# Patient Record
Sex: Female | Born: 1955 | Race: Black or African American | Hispanic: No | Marital: Single
Health system: Southern US, Academic
[De-identification: ages and names within clinical notes are randomized; demographics above are authoritative.]

## PROBLEM LIST (undated history)

## (undated) ENCOUNTER — Ambulatory Visit: Payer: MEDICARE

## (undated) ENCOUNTER — Telehealth

## (undated) ENCOUNTER — Encounter

## (undated) ENCOUNTER — Ambulatory Visit

## (undated) ENCOUNTER — Encounter: Payer: MEDICARE | Attending: Internal Medicine | Primary: Internal Medicine

## (undated) ENCOUNTER — Encounter: Payer: MEDICARE | Attending: Nephrology | Primary: Nephrology

## (undated) ENCOUNTER — Ambulatory Visit: Payer: Medicare (Managed Care)

## (undated) ENCOUNTER — Encounter: Attending: Physician Assistant | Primary: Physician Assistant

## (undated) ENCOUNTER — Ambulatory Visit: Payer: MEDICARE | Attending: Surgery | Primary: Surgery

## (undated) ENCOUNTER — Telehealth: Attending: Hospitalist | Primary: Hospitalist

## (undated) ENCOUNTER — Ambulatory Visit: Payer: MEDICARE | Attending: Psychologist | Primary: Psychologist

## (undated) ENCOUNTER — Inpatient Hospital Stay

## (undated) ENCOUNTER — Non-Acute Institutional Stay: Payer: MEDICARE | Attending: Nephrology | Primary: Nephrology

## (undated) ENCOUNTER — Ambulatory Visit
Payer: MEDICARE | Attending: Student in an Organized Health Care Education/Training Program | Primary: Student in an Organized Health Care Education/Training Program

## (undated) ENCOUNTER — Encounter: Attending: Nephrology | Primary: Nephrology

## (undated) DIAGNOSIS — D649 Anemia, unspecified: Secondary | ICD-10-CM

## (undated) DIAGNOSIS — K754 Autoimmune hepatitis: Secondary | ICD-10-CM

## (undated) DIAGNOSIS — R569 Unspecified convulsions: Principal | ICD-10-CM

## (undated) DIAGNOSIS — N186 End stage renal disease: Secondary | ICD-10-CM

## (undated) DIAGNOSIS — M1990 Unspecified osteoarthritis, unspecified site: Secondary | ICD-10-CM

## (undated) DIAGNOSIS — Z992 Dependence on renal dialysis: Secondary | ICD-10-CM

## (undated) DIAGNOSIS — I10 Essential (primary) hypertension: Secondary | ICD-10-CM

## (undated) DIAGNOSIS — M109 Gout, unspecified: Secondary | ICD-10-CM

## (undated) HISTORY — PX: ABDOMINAL HYSTERECTOMY: SHX81

## (undated) HISTORY — DX: Unspecified convulsions: R56.9

## (undated) HISTORY — PX: TONSILLECTOMY: SUR1361

## (undated) HISTORY — DX: Autoimmune hepatitis: K75.4

## (undated) HISTORY — PX: LIVER TRANSPLANT: SHX410

## (undated) MED ORDER — VITAMIN E (DL, ACETATE) 22.5 MG (50 UNIT)/ML ORAL DROPS: ORAL | 0 days

---
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
## 2008-11-13 ENCOUNTER — Ambulatory Visit: Payer: Self-pay | Admitting: Hematology & Oncology

## 2008-11-19 LAB — CBC WITH DIFFERENTIAL (CANCER CENTER ONLY)
BASO#: 0 10*3/uL (ref 0.0–0.2)
EOS%: 2.7 % (ref 0.0–7.0)
HCT: 28.2 % — ABNORMAL LOW (ref 34.8–46.6)
HGB: 9.6 g/dL — ABNORMAL LOW (ref 11.6–15.9)
MCH: 30.7 pg (ref 26.0–34.0)
MCHC: 33.9 g/dL (ref 32.0–36.0)
MONO%: 7 % (ref 0.0–13.0)
NEUT%: 59.8 % (ref 39.6–80.0)

## 2008-11-21 LAB — RETICULOCYTES (CHCC)
ABS Retic: 30.4 10*3/uL (ref 19.0–186.0)
RBC.: 3.38 MIL/uL — ABNORMAL LOW (ref 3.87–5.11)

## 2008-11-21 LAB — PROTEIN ELECTROPHORESIS, SERUM
Albumin ELP: 41.1 % — ABNORMAL LOW (ref 55.8–66.1)
Beta Globulin: 4 % — ABNORMAL LOW (ref 4.7–7.2)
Total Protein, Serum Electrophoresis: 9.4 g/dL — ABNORMAL HIGH (ref 6.0–8.3)

## 2008-11-21 LAB — COMPREHENSIVE METABOLIC PANEL
Alkaline Phosphatase: 111 U/L (ref 39–117)
Glucose, Bld: 143 mg/dL — ABNORMAL HIGH (ref 70–99)
Sodium: 135 mEq/L (ref 135–145)
Total Bilirubin: 0.6 mg/dL (ref 0.3–1.2)
Total Protein: 9.4 g/dL — ABNORMAL HIGH (ref 6.0–8.3)

## 2008-11-21 LAB — FERRITIN: Ferritin: 920 ng/mL — ABNORMAL HIGH (ref 10–291)

## 2008-12-10 LAB — CBC WITH DIFFERENTIAL (CANCER CENTER ONLY)
BASO#: 0 10*3/uL (ref 0.0–0.2)
EOS%: 5.2 % (ref 0.0–7.0)
HCT: 34.5 % — ABNORMAL LOW (ref 34.8–46.6)
HGB: 11.3 g/dL — ABNORMAL LOW (ref 11.6–15.9)
LYMPH#: 0.6 10*3/uL — ABNORMAL LOW (ref 0.9–3.3)
MCHC: 32.9 g/dL (ref 32.0–36.0)
MONO#: 0.2 10*3/uL (ref 0.1–0.9)
NEUT%: 55.7 % (ref 39.6–80.0)

## 2008-12-30 ENCOUNTER — Ambulatory Visit: Payer: Self-pay | Admitting: Hematology & Oncology

## 2008-12-31 LAB — CBC WITH DIFFERENTIAL (CANCER CENTER ONLY)
BASO#: 0 10*3/uL (ref 0.0–0.2)
BASO%: 0.5 % (ref 0.0–2.0)
Eosinophils Absolute: 0.1 10*3/uL (ref 0.0–0.5)
HCT: 30.5 % — ABNORMAL LOW (ref 34.8–46.6)
HGB: 10.2 g/dL — ABNORMAL LOW (ref 11.6–15.9)
LYMPH#: 0.5 10*3/uL — ABNORMAL LOW (ref 0.9–3.3)
MONO#: 0.2 10*3/uL (ref 0.1–0.9)
NEUT%: 56.8 % (ref 39.6–80.0)
RBC: 3.39 10*6/uL — ABNORMAL LOW (ref 3.70–5.32)
RDW: 12.4 % (ref 10.5–14.6)
WBC: 1.6 10*3/uL — ABNORMAL LOW (ref 3.9–10.0)

## 2008-12-31 LAB — CHCC SATELLITE - SMEAR

## 2009-01-02 LAB — TRANSFERRIN RECEPTOR, SOLUABLE: Transferrin Receptor, Soluble: 14.8 nmol/L

## 2009-01-02 LAB — FERRITIN: Ferritin: 909 ng/mL — ABNORMAL HIGH (ref 10–291)

## 2009-01-16 LAB — CBC WITH DIFFERENTIAL (CANCER CENTER ONLY)
BASO#: 0 10*3/uL (ref 0.0–0.2)
EOS%: 8.4 % — ABNORMAL HIGH (ref 0.0–7.0)
HCT: 34.2 % — ABNORMAL LOW (ref 34.8–46.6)
HGB: 11.3 g/dL — ABNORMAL LOW (ref 11.6–15.9)
LYMPH%: 25.5 % (ref 14.0–48.0)
MCH: 30.2 pg (ref 26.0–34.0)
MCHC: 33.2 g/dL (ref 32.0–36.0)
MONO%: 13.6 % — ABNORMAL HIGH (ref 0.0–13.0)
NEUT%: 51.2 % (ref 39.6–80.0)

## 2009-01-28 LAB — CBC WITH DIFFERENTIAL (CANCER CENTER ONLY)
BASO%: 0.3 % (ref 0.0–2.0)
LYMPH%: 28.9 % (ref 14.0–48.0)
MCH: 29.5 pg (ref 26.0–34.0)
MCV: 91 fL (ref 81–101)
MONO%: 8.1 % (ref 0.0–13.0)
NEUT#: 1 10*3/uL — ABNORMAL LOW (ref 1.5–6.5)
Platelets: 131 10*3/uL — ABNORMAL LOW (ref 145–400)
RDW: 12.9 % (ref 10.5–14.6)
WBC: 1.8 10*3/uL — ABNORMAL LOW (ref 3.9–10.0)

## 2009-02-11 LAB — CBC WITH DIFFERENTIAL (CANCER CENTER ONLY)
BASO%: 0.2 % (ref 0.0–2.0)
LYMPH#: 0.5 10*3/uL — ABNORMAL LOW (ref 0.9–3.3)
MONO#: 0.2 10*3/uL (ref 0.1–0.9)
NEUT#: 1.1 10*3/uL — ABNORMAL LOW (ref 1.5–6.5)
Platelets: 155 10*3/uL (ref 145–400)
RBC: 3.45 10*6/uL — ABNORMAL LOW (ref 3.70–5.32)
RDW: 13.6 % (ref 10.5–14.6)
WBC: 1.8 10*3/uL — ABNORMAL LOW (ref 3.9–10.0)

## 2009-02-13 LAB — RETICULOCYTES (CHCC)
RBC.: 3.41 MIL/uL — ABNORMAL LOW (ref 3.87–5.11)
Retic Ct Pct: 0.6 % (ref 0.4–3.1)

## 2009-02-13 LAB — FERRITIN: Ferritin: 1032 ng/mL — ABNORMAL HIGH (ref 10–291)

## 2009-02-24 ENCOUNTER — Ambulatory Visit: Payer: Self-pay | Admitting: Hematology & Oncology

## 2009-02-27 LAB — CBC WITH DIFFERENTIAL (CANCER CENTER ONLY)
BASO#: 0 10*3/uL (ref 0.0–0.2)
Eosinophils Absolute: 0.1 10*3/uL (ref 0.0–0.5)
HCT: 32.7 % — ABNORMAL LOW (ref 34.8–46.6)
HGB: 11.1 g/dL — ABNORMAL LOW (ref 11.6–15.9)
LYMPH#: 0.4 10*3/uL — ABNORMAL LOW (ref 0.9–3.3)
MCHC: 33.8 g/dL (ref 32.0–36.0)
MONO#: 0.3 10*3/uL (ref 0.1–0.9)
NEUT%: 69.5 % (ref 39.6–80.0)
RBC: 3.64 10*6/uL — ABNORMAL LOW (ref 3.70–5.32)

## 2009-03-18 LAB — CBC WITH DIFFERENTIAL (CANCER CENTER ONLY)
BASO#: 0 10*3/uL (ref 0.0–0.2)
BASO%: 0.3 % (ref 0.0–2.0)
EOS%: 2.7 % (ref 0.0–7.0)
Eosinophils Absolute: 0.1 10*3/uL (ref 0.0–0.5)
HCT: 34.3 % — ABNORMAL LOW (ref 34.8–46.6)
HGB: 11.3 g/dL — ABNORMAL LOW (ref 11.6–15.9)
LYMPH#: 0.6 10*3/uL — ABNORMAL LOW (ref 0.9–3.3)
LYMPH%: 26.7 % (ref 14.0–48.0)
MCH: 29.9 pg (ref 26.0–34.0)
MCHC: 33 g/dL (ref 32.0–36.0)
MCV: 91 fL (ref 81–101)
MONO#: 0.2 10*3/uL (ref 0.1–0.9)
MONO%: 8 % (ref 0.0–13.0)
NEUT#: 1.3 10*3/uL — ABNORMAL LOW (ref 1.5–6.5)
NEUT%: 62.3 % (ref 39.6–80.0)
Platelets: 150 10*3/uL (ref 145–400)
RBC: 3.79 10*6/uL (ref 3.70–5.32)
RDW: 12.9 % (ref 10.5–14.6)
WBC: 2.1 10*3/uL — ABNORMAL LOW (ref 3.9–10.0)

## 2009-03-27 LAB — CBC WITH DIFFERENTIAL (CANCER CENTER ONLY)
BASO%: 0.2 % (ref 0.0–2.0)
HCT: 30.9 % — ABNORMAL LOW (ref 34.8–46.6)
HGB: 10.4 g/dL — ABNORMAL LOW (ref 11.6–15.9)
LYMPH#: 0.6 10*3/uL — ABNORMAL LOW (ref 0.9–3.3)
MONO#: 0.3 10*3/uL (ref 0.1–0.9)
NEUT#: 1.3 10*3/uL — ABNORMAL LOW (ref 1.5–6.5)
NEUT%: 58.4 % (ref 39.6–80.0)
RDW: 12.6 % (ref 10.5–14.6)
WBC: 2.3 10*3/uL — ABNORMAL LOW (ref 3.9–10.0)

## 2009-04-21 ENCOUNTER — Ambulatory Visit: Payer: Self-pay | Admitting: Hematology & Oncology

## 2009-04-22 LAB — MANUAL DIFFERENTIAL (CHCC SATELLITE)
LYMPH: 34 % (ref 14–48)
MONO: 6 % (ref 0–13)
PLT EST ~~LOC~~: ADEQUATE
SEG: 58 % (ref 40–75)

## 2009-04-22 LAB — CBC WITH DIFFERENTIAL (CANCER CENTER ONLY)
HCT: 27.5 % — ABNORMAL LOW (ref 34.8–46.6)
HGB: 9 g/dL — ABNORMAL LOW (ref 11.6–15.9)
MCH: 28.8 pg (ref 26.0–34.0)
MCHC: 32.9 g/dL (ref 32.0–36.0)
MCV: 87 fL (ref 81–101)
RBC: 3.14 10*6/uL — ABNORMAL LOW (ref 3.70–5.32)

## 2009-05-20 LAB — MANUAL DIFFERENTIAL (CHCC SATELLITE)
ALC: 0.7 10*3/uL (ref 0.6–2.2)
ANC (CHCC HP manual diff): 1.2 10*3/uL — ABNORMAL LOW (ref 1.5–6.7)
LYMPH: 30 % (ref 14–48)
PLT EST ~~LOC~~: ADEQUATE
SEG: 55 % (ref 40–75)

## 2009-05-20 LAB — CBC WITH DIFFERENTIAL (CANCER CENTER ONLY)
MCH: 28.5 pg (ref 26.0–34.0)
Platelets: 161 10*3/uL (ref 145–400)
RBC: 3.92 10*6/uL (ref 3.70–5.32)
WBC: 2.2 10*3/uL — ABNORMAL LOW (ref 3.9–10.0)

## 2009-06-02 ENCOUNTER — Ambulatory Visit: Payer: Self-pay | Admitting: Hematology & Oncology

## 2009-06-03 LAB — COMPREHENSIVE METABOLIC PANEL
BUN: 20 mg/dL (ref 6–23)
CO2: 20 mEq/L (ref 19–32)
Calcium: 8.2 mg/dL — ABNORMAL LOW (ref 8.4–10.5)
Chloride: 106 mEq/L (ref 96–112)
Creatinine, Ser: 1.39 mg/dL — ABNORMAL HIGH (ref 0.40–1.20)
Glucose, Bld: 127 mg/dL — ABNORMAL HIGH (ref 70–99)
Total Bilirubin: 0.7 mg/dL (ref 0.3–1.2)

## 2009-06-03 LAB — CBC WITH DIFFERENTIAL (CANCER CENTER ONLY)
EOS%: 5.7 % (ref 0.0–7.0)
MCH: 28.5 pg (ref 26.0–34.0)
MCHC: 32.8 g/dL (ref 32.0–36.0)
MONO%: 8.8 % (ref 0.0–13.0)
NEUT#: 0.9 10*3/uL — ABNORMAL LOW (ref 1.5–6.5)
Platelets: 121 10*3/uL — ABNORMAL LOW (ref 145–400)

## 2009-06-03 LAB — FERRITIN: Ferritin: 716 ng/mL — ABNORMAL HIGH (ref 10–291)

## 2009-07-01 LAB — CBC WITH DIFFERENTIAL (CANCER CENTER ONLY)
BASO%: 0.8 % (ref 0.0–2.0)
EOS%: 5.6 % (ref 0.0–7.0)
LYMPH%: 31.7 % (ref 14.0–48.0)
MCHC: 34.1 g/dL (ref 32.0–36.0)
MCV: 85 fL (ref 81–101)
MONO#: 0.2 10*3/uL (ref 0.1–0.9)
MONO%: 14.4 % — ABNORMAL HIGH (ref 0.0–13.0)
Platelets: 72 10*3/uL — ABNORMAL LOW (ref 145–400)
RDW: 13.8 % (ref 10.5–14.6)
WBC: 1.2 10*3/uL — ABNORMAL LOW (ref 3.9–10.0)

## 2009-07-14 ENCOUNTER — Ambulatory Visit: Payer: Self-pay | Admitting: Hematology & Oncology

## 2009-07-21 LAB — CBC WITH DIFFERENTIAL (CANCER CENTER ONLY)
BASO%: 0.4 % (ref 0.0–2.0)
EOS%: 3.7 % (ref 0.0–7.0)
HCT: 31.4 % — ABNORMAL LOW (ref 34.8–46.6)
LYMPH#: 0.5 10*3/uL — ABNORMAL LOW (ref 0.9–3.3)
MCHC: 34 g/dL (ref 32.0–36.0)
MONO#: 0.1 10*3/uL (ref 0.1–0.9)
NEUT#: 0.8 10*3/uL — ABNORMAL LOW (ref 1.5–6.5)
Platelets: 79 10*3/uL — ABNORMAL LOW (ref 145–400)
RDW: 15.5 % — ABNORMAL HIGH (ref 10.5–14.6)
WBC: 1.4 10*3/uL — ABNORMAL LOW (ref 3.9–10.0)

## 2009-08-13 ENCOUNTER — Ambulatory Visit: Payer: Self-pay | Admitting: Hematology & Oncology

## 2009-08-14 LAB — CBC WITH DIFFERENTIAL (CANCER CENTER ONLY)
BASO%: 0.9 % (ref 0.0–2.0)
Eosinophils Absolute: 0 10*3/uL (ref 0.0–0.5)
LYMPH%: 38.9 % (ref 14.0–48.0)
MCH: 29.4 pg (ref 26.0–34.0)
MCV: 88 fL (ref 81–101)
MONO%: 12.3 % (ref 0.0–13.0)
NEUT%: 46.2 % (ref 39.6–80.0)
Platelets: 104 10*3/uL — ABNORMAL LOW (ref 145–400)
RDW: 14.4 % (ref 10.5–14.6)

## 2009-08-14 LAB — FERRITIN: Ferritin: 588 ng/mL — ABNORMAL HIGH (ref 10–291)

## 2009-08-14 LAB — COMPREHENSIVE METABOLIC PANEL
ALT: <8 U/L (ref 0–35)
AST: 16 U/L (ref 0–37)
Albumin: 3.8 g/dL (ref 3.5–5.2)
CO2: 21 mEq/L (ref 19–32)
Calcium: 8 mg/dL — ABNORMAL LOW (ref 8.4–10.5)
Chloride: 109 mEq/L (ref 96–112)
Creatinine, Ser: 1.1 mg/dL (ref 0.40–1.20)
Potassium: 4.4 mEq/L (ref 3.5–5.3)

## 2009-08-14 LAB — RETICULOCYTES (CHCC): Retic Ct Pct: 0.5 % (ref 0.4–3.1)

## 2009-08-14 LAB — CHCC SATELLITE - SMEAR

## 2009-09-11 LAB — CBC WITH DIFFERENTIAL (CANCER CENTER ONLY)
BASO#: 0 10*3/uL (ref 0.0–0.2)
Eosinophils Absolute: 0 10*3/uL (ref 0.0–0.5)
HCT: 27.5 % — ABNORMAL LOW (ref 34.8–46.6)
HGB: 9.3 g/dL — ABNORMAL LOW (ref 11.6–15.9)
LYMPH%: 14.7 % (ref 14.0–48.0)
MCH: 30.1 pg (ref 26.0–34.0)
MCV: 89 fL (ref 81–101)
MONO%: 12.3 % (ref 0.0–13.0)
RBC: 3.08 10*6/uL — ABNORMAL LOW (ref 3.70–5.32)

## 2009-09-25 ENCOUNTER — Ambulatory Visit: Payer: Self-pay | Admitting: Hematology & Oncology

## 2009-09-28 LAB — CBC WITH DIFFERENTIAL (CANCER CENTER ONLY)
BASO#: 0 10*3/uL (ref 0.0–0.2)
Eosinophils Absolute: 0 10*3/uL (ref 0.0–0.5)
HGB: 11.1 g/dL — ABNORMAL LOW (ref 11.6–15.9)
LYMPH#: 0.4 10*3/uL — ABNORMAL LOW (ref 0.9–3.3)
MCH: 30 pg (ref 26.0–34.0)
MONO#: 0.1 10*3/uL (ref 0.1–0.9)
NEUT#: 0.5 10*3/uL — ABNORMAL LOW (ref 1.5–6.5)
RBC: 3.72 10*6/uL (ref 3.70–5.32)

## 2009-09-28 LAB — RETICULOCYTES (CHCC)
ABS Retic: 34.1 10*3/uL (ref 19.0–186.0)
RBC.: 3.79 MIL/uL — ABNORMAL LOW (ref 3.87–5.11)

## 2009-11-16 ENCOUNTER — Ambulatory Visit: Payer: Self-pay | Admitting: Hematology & Oncology

## 2009-11-16 LAB — CBC WITH DIFFERENTIAL (CANCER CENTER ONLY)
BASO#: 0 10*3/uL (ref 0.0–0.2)
BASO%: 0.9 % (ref 0.0–2.0)
EOS%: 2 % (ref 0.0–7.0)
HGB: 9.6 g/dL — ABNORMAL LOW (ref 11.6–15.9)
LYMPH#: 0.4 10*3/uL — ABNORMAL LOW (ref 0.9–3.3)
LYMPH%: 29.1 % (ref 14.0–48.0)
MCH: 29.3 pg (ref 26.0–34.0)
NEUT#: 0.9 10*3/uL — ABNORMAL LOW (ref 1.5–6.5)
RBC: 3.26 10*6/uL — ABNORMAL LOW (ref 3.70–5.32)
RDW: 13.8 % (ref 10.5–14.6)

## 2009-11-16 LAB — CHCC SATELLITE - SMEAR

## 2009-11-16 LAB — COMPREHENSIVE METABOLIC PANEL
Albumin: 3.5 g/dL (ref 3.5–5.2)
BUN: 30 mg/dL — ABNORMAL HIGH (ref 6–23)
CO2: 21 mEq/L (ref 19–32)
Calcium: 8.5 mg/dL (ref 8.4–10.5)
Chloride: 107 mEq/L (ref 96–112)
Potassium: 4.6 mEq/L (ref 3.5–5.3)
Sodium: 137 mEq/L (ref 135–145)
Total Bilirubin: 0.4 mg/dL (ref 0.3–1.2)

## 2009-12-16 ENCOUNTER — Ambulatory Visit: Payer: Self-pay | Admitting: Hematology & Oncology

## 2009-12-24 LAB — CBC WITH DIFFERENTIAL (CANCER CENTER ONLY)
EOS%: 2.4 % (ref 0.0–7.0)
HCT: 29 % — ABNORMAL LOW (ref 34.8–46.6)
HGB: 9.5 g/dL — ABNORMAL LOW (ref 11.6–15.9)
LYMPH#: 0.6 10*3/uL — ABNORMAL LOW (ref 0.9–3.3)
LYMPH%: 28.4 % (ref 14.0–48.0)
NEUT%: 57.7 % (ref 39.6–80.0)
Platelets: 127 10*3/uL — ABNORMAL LOW (ref 145–400)
RBC: 3.29 10*6/uL — ABNORMAL LOW (ref 3.70–5.32)

## 2009-12-24 LAB — CHCC SATELLITE - SMEAR

## 2010-01-07 LAB — CBC WITH DIFFERENTIAL (CANCER CENTER ONLY)
BASO%: 0.2 % (ref 0.0–2.0)
Eosinophils Absolute: 0.1 10*3/uL (ref 0.0–0.5)
HGB: 8.9 g/dL — ABNORMAL LOW (ref 11.6–15.9)
MCH: 29.1 pg (ref 26.0–34.0)
MCHC: 32.9 g/dL (ref 32.0–36.0)
MONO#: 0.2 10*3/uL (ref 0.1–0.9)
NEUT%: 46.3 % (ref 39.6–80.0)
Platelets: 181 10*3/uL (ref 145–400)
RBC: 3.07 10*6/uL — ABNORMAL LOW (ref 3.70–5.32)
RDW: 14.6 % (ref 10.5–14.6)

## 2010-01-07 LAB — COMPREHENSIVE METABOLIC PANEL
ALT: 10 U/L (ref 0–35)
AST: 16 U/L (ref 0–37)
Albumin: 3.4 g/dL — ABNORMAL LOW (ref 3.5–5.2)
BUN: 34 mg/dL — ABNORMAL HIGH (ref 6–23)
CO2: 19 mEq/L (ref 19–32)
Creatinine, Ser: 1.43 mg/dL — ABNORMAL HIGH (ref 0.40–1.20)
Sodium: 136 mEq/L (ref 135–145)
Total Bilirubin: 0.5 mg/dL (ref 0.3–1.2)

## 2010-01-07 LAB — LACTATE DEHYDROGENASE: LDH: 143 U/L (ref 94–250)

## 2010-01-21 ENCOUNTER — Ambulatory Visit: Payer: Self-pay | Admitting: Hematology & Oncology

## 2010-01-22 LAB — CBC WITH DIFFERENTIAL (CANCER CENTER ONLY)
EOS%: 0.6 % (ref 0.0–7.0)
Eosinophils Absolute: 0 10*3/uL (ref 0.0–0.5)
LYMPH#: 0.3 10*3/uL — ABNORMAL LOW (ref 0.9–3.3)
MCH: 28.6 pg (ref 26.0–34.0)
MONO%: 6 % (ref 0.0–13.0)
RBC: 3.71 10*6/uL (ref 3.70–5.32)
WBC: 2.4 10*3/uL — ABNORMAL LOW (ref 3.9–10.0)

## 2010-02-05 LAB — CBC WITH DIFFERENTIAL (CANCER CENTER ONLY)
EOS%: 3.2 % (ref 0.0–7.0)
Eosinophils Absolute: 0.1 10*3/uL (ref 0.0–0.5)
HCT: 35 % (ref 34.8–46.6)
HGB: 11.3 g/dL — ABNORMAL LOW (ref 11.6–15.9)
LYMPH#: 0.5 10*3/uL — ABNORMAL LOW (ref 0.9–3.3)
LYMPH%: 27.8 % (ref 14.0–48.0)
MCHC: 32.2 g/dL (ref 32.0–36.0)
MONO#: 0.3 10*3/uL (ref 0.1–0.9)
NEUT#: 0.9 10*3/uL — ABNORMAL LOW (ref 1.5–6.5)
NEUT%: 53.1 % (ref 39.6–80.0)
Platelets: 100 10*3/uL — ABNORMAL LOW (ref 145–400)
RDW: 15.1 % — ABNORMAL HIGH (ref 10.5–14.6)
WBC: 1.7 10*3/uL — ABNORMAL LOW (ref 3.9–10.0)

## 2010-02-05 LAB — CHCC SATELLITE - SMEAR

## 2010-02-19 LAB — CBC WITH DIFFERENTIAL (CANCER CENTER ONLY)
EOS%: 4.9 % (ref 0.0–7.0)
Eosinophils Absolute: 0.1 10*3/uL (ref 0.0–0.5)
HGB: 11.4 g/dL — ABNORMAL LOW (ref 11.6–15.9)
LYMPH#: 0.5 10*3/uL — ABNORMAL LOW (ref 0.9–3.3)
MCH: 28.1 pg (ref 26.0–34.0)
NEUT#: 1 10*3/uL — ABNORMAL LOW (ref 1.5–6.5)
NEUT%: 57.5 % (ref 39.6–80.0)
Platelets: 188 10*3/uL (ref 145–400)

## 2010-03-03 ENCOUNTER — Ambulatory Visit: Payer: Self-pay | Admitting: Hematology & Oncology

## 2010-03-05 LAB — CBC WITH DIFFERENTIAL (CANCER CENTER ONLY)
EOS%: 3.5 % (ref 0.0–7.0)
HCT: 28.9 % — ABNORMAL LOW (ref 34.8–46.6)
HGB: 9.8 g/dL — ABNORMAL LOW (ref 11.6–15.9)
LYMPH#: 0.4 10*3/uL — ABNORMAL LOW (ref 0.9–3.3)
MCH: 28.8 pg (ref 26.0–34.0)
NEUT%: 55 % (ref 39.6–80.0)
RBC: 3.4 10*6/uL — ABNORMAL LOW (ref 3.70–5.32)
WBC: 1.4 10*3/uL — ABNORMAL LOW (ref 3.9–10.0)

## 2010-03-23 LAB — CBC WITH DIFFERENTIAL (CANCER CENTER ONLY)
EOS%: 3.9 % (ref 0.0–7.0)
LYMPH%: 33.5 % (ref 14.0–48.0)
MCV: 86 fL (ref 81–101)
MONO#: 0.2 10*3/uL (ref 0.1–0.9)
NEUT#: 0.7 10*3/uL — ABNORMAL LOW (ref 1.5–6.5)
Platelets: 135 10*3/uL — ABNORMAL LOW (ref 145–400)
RBC: 4.51 10*6/uL (ref 3.70–5.32)
RDW: 15.6 % — ABNORMAL HIGH (ref 10.5–14.6)
WBC: 1.3 10*3/uL — ABNORMAL LOW (ref 3.9–10.0)

## 2010-03-23 LAB — COMPREHENSIVE METABOLIC PANEL
AST: 21 U/L (ref 0–37)
Albumin: 3.8 g/dL (ref 3.5–5.2)
Calcium: 9.5 mg/dL (ref 8.4–10.5)
Chloride: 105 mEq/L (ref 96–112)
Creatinine, Ser: 1.55 mg/dL — ABNORMAL HIGH (ref 0.40–1.20)
Glucose, Bld: 95 mg/dL (ref 70–99)
Sodium: 135 mEq/L (ref 135–145)
Total Bilirubin: 0.5 mg/dL (ref 0.3–1.2)
Total Protein: 8.8 g/dL — ABNORMAL HIGH (ref 6.0–8.3)

## 2010-03-23 LAB — RETICULOCYTES (CHCC)
RBC.: 4.52 MIL/uL (ref 3.87–5.11)
Retic Ct Pct: 0.8 % (ref 0.4–3.1)

## 2010-04-01 LAB — CBC WITH DIFFERENTIAL (CANCER CENTER ONLY)
BASO#: 0 10*3/uL (ref 0.0–0.2)
BASO%: 0.5 % (ref 0.0–2.0)
EOS%: 3 % (ref 0.0–7.0)
Eosinophils Absolute: 0 10*3/uL (ref 0.0–0.5)
HCT: 32.9 % — ABNORMAL LOW (ref 34.8–46.6)
HGB: 10.9 g/dL — ABNORMAL LOW (ref 11.6–15.9)
MCH: 28.4 pg (ref 26.0–34.0)
MCV: 86 fL (ref 81–101)
MONO#: 0.2 10*3/uL (ref 0.1–0.9)
NEUT#: 0.6 10*3/uL — ABNORMAL LOW (ref 1.5–6.5)
RBC: 3.83 10*6/uL (ref 3.70–5.32)
RDW: 15.9 % — ABNORMAL HIGH (ref 10.5–14.6)
WBC: 1.3 10*3/uL — ABNORMAL LOW (ref 3.9–10.0)

## 2010-04-14 ENCOUNTER — Ambulatory Visit: Payer: Self-pay | Admitting: Hematology & Oncology

## 2010-04-20 LAB — CBC WITH DIFFERENTIAL (CANCER CENTER ONLY)
BASO#: 0 10*3/uL (ref 0.0–0.2)
Eosinophils Absolute: 0.1 10*3/uL (ref 0.0–0.5)
HCT: 30.3 % — ABNORMAL LOW (ref 34.8–46.6)
HGB: 10.3 g/dL — ABNORMAL LOW (ref 11.6–15.9)
LYMPH#: 0.6 10*3/uL — ABNORMAL LOW (ref 0.9–3.3)
LYMPH%: 34.1 % (ref 14.0–48.0)
MCH: 29 pg (ref 26.0–34.0)
MCV: 86 fL (ref 81–101)
MONO#: 0.2 10*3/uL (ref 0.1–0.9)
NEUT%: 48.5 % (ref 39.6–80.0)
WBC: 1.8 10*3/uL — ABNORMAL LOW (ref 3.9–10.0)

## 2010-05-05 LAB — CBC WITH DIFFERENTIAL (CANCER CENTER ONLY)
HCT: 32.1 % — ABNORMAL LOW (ref 34.8–46.6)
HGB: 10.9 g/dL — ABNORMAL LOW (ref 11.6–15.9)
LYMPH#: 0.4 10*3/uL — ABNORMAL LOW (ref 0.9–3.3)
NEUT#: 0.7 10*3/uL — ABNORMAL LOW (ref 1.5–6.5)
RDW: 14.8 % — ABNORMAL HIGH (ref 10.5–14.6)
WBC: 1.2 10*3/uL — ABNORMAL LOW (ref 3.9–10.0)

## 2010-05-06 LAB — IRON AND TIBC
Iron: 62 ug/dL (ref 42–145)
UIBC: 137 ug/dL

## 2010-05-06 LAB — FERRITIN: Ferritin: 790 ng/mL — ABNORMAL HIGH (ref 10–291)

## 2010-05-12 ENCOUNTER — Ambulatory Visit: Payer: Self-pay | Admitting: Diagnostic Radiology

## 2010-05-12 ENCOUNTER — Ambulatory Visit (HOSPITAL_BASED_OUTPATIENT_CLINIC_OR_DEPARTMENT_OTHER): Admission: RE | Admit: 2010-05-12 | Discharge: 2010-05-12 | Payer: Self-pay | Admitting: Hematology & Oncology

## 2010-05-19 ENCOUNTER — Ambulatory Visit: Payer: Self-pay | Admitting: Hematology & Oncology

## 2010-05-21 LAB — CBC WITH DIFFERENTIAL (CANCER CENTER ONLY)
BASO%: 0.5 % (ref 0.0–2.0)
Eosinophils Absolute: 0.1 10*3/uL (ref 0.0–0.5)
HCT: 29.8 % — ABNORMAL LOW (ref 34.8–46.6)
HGB: 10 g/dL — ABNORMAL LOW (ref 11.6–15.9)
MCHC: 33.6 g/dL (ref 32.0–36.0)
MCV: 87 fL (ref 81–101)
MONO#: 0.2 10*3/uL (ref 0.1–0.9)
NEUT#: 0.9 10*3/uL — ABNORMAL LOW (ref 1.5–6.5)
RDW: 14.5 % (ref 10.5–14.6)

## 2010-06-02 LAB — CBC WITH DIFFERENTIAL (CANCER CENTER ONLY)
BASO%: 0.2 % (ref 0.0–2.0)
EOS%: 4.8 % (ref 0.0–7.0)
HCT: 35.3 % (ref 34.8–46.6)
HGB: 11.9 g/dL (ref 11.6–15.9)
MCH: 29.9 pg (ref 26.0–34.0)
MCHC: 33.6 g/dL (ref 32.0–36.0)
MCV: 89 fL (ref 81–101)
NEUT#: 0.6 10*3/uL — ABNORMAL LOW (ref 1.5–6.5)
NEUT%: 49.5 % (ref 39.6–80.0)
RBC: 3.97 10*6/uL (ref 3.70–5.32)
RDW: 13.9 % (ref 10.5–14.6)

## 2010-06-02 LAB — FERRITIN: Ferritin: 784 ng/mL — ABNORMAL HIGH (ref 10–291)

## 2010-06-24 ENCOUNTER — Ambulatory Visit: Payer: Self-pay | Admitting: Hematology & Oncology

## 2010-06-24 LAB — CBC WITH DIFFERENTIAL (CANCER CENTER ONLY)
BASO#: 0 10*3/uL (ref 0.0–0.2)
EOS%: 3.8 % (ref 0.0–7.0)
Eosinophils Absolute: 0.1 10*3/uL (ref 0.0–0.5)
HCT: 33.6 % — ABNORMAL LOW (ref 34.8–46.6)
LYMPH%: 30.5 % (ref 14.0–48.0)
MCH: 29.6 pg (ref 26.0–34.0)
MCHC: 33.2 g/dL (ref 32.0–36.0)
MCV: 89 fL (ref 81–101)
MONO#: 0.2 10*3/uL (ref 0.1–0.9)
MONO%: 11.5 % (ref 0.0–13.0)
NEUT#: 0.9 10*3/uL — ABNORMAL LOW (ref 1.5–6.5)
RBC: 3.77 10*6/uL (ref 3.70–5.32)
WBC: 1.6 10*3/uL — ABNORMAL LOW (ref 3.9–10.0)

## 2010-07-14 LAB — CBC WITH DIFFERENTIAL (CANCER CENTER ONLY)
BASO%: 0.4 % (ref 0.0–2.0)
Eosinophils Absolute: 0.1 10*3/uL (ref 0.0–0.5)
HGB: 8.9 g/dL — ABNORMAL LOW (ref 11.6–15.9)
LYMPH#: 0.5 10*3/uL — ABNORMAL LOW (ref 0.9–3.3)
LYMPH%: 31.6 % (ref 14.0–48.0)
MCH: 29.6 pg (ref 26.0–34.0)
MCHC: 33.7 g/dL (ref 32.0–36.0)
MCV: 88 fL (ref 81–101)
MONO#: 0.2 10*3/uL (ref 0.1–0.9)
MONO%: 12.5 % (ref 0.0–13.0)
NEUT#: 0.7 10*3/uL — ABNORMAL LOW (ref 1.5–6.5)
NEUT%: 50 % (ref 39.6–80.0)
Platelets: 109 10*3/uL — ABNORMAL LOW (ref 145–400)
RDW: 13 % (ref 10.5–14.6)
WBC: 1.4 10*3/uL — ABNORMAL LOW (ref 3.9–10.0)

## 2010-08-10 ENCOUNTER — Ambulatory Visit: Payer: Self-pay | Admitting: Hematology & Oncology

## 2010-09-10 ENCOUNTER — Ambulatory Visit: Payer: Self-pay | Admitting: Hematology & Oncology

## 2010-09-10 LAB — CBC WITH DIFFERENTIAL (CANCER CENTER ONLY)
BASO#: 0 10*3/uL (ref 0.0–0.2)
Eosinophils Absolute: 0 10*3/uL (ref 0.0–0.5)
HGB: 11.7 g/dL (ref 11.6–15.9)
LYMPH%: 24.5 % (ref 14.0–48.0)
MCH: 30 pg (ref 26.0–34.0)
Platelets: 126 10*3/uL — ABNORMAL LOW (ref 145–400)
RBC: 3.91 10*6/uL (ref 3.70–5.32)
RDW: 13.1 % (ref 10.5–14.6)

## 2010-09-10 LAB — FERRITIN: Ferritin: 941 ng/mL — ABNORMAL HIGH (ref 10–291)

## 2010-10-07 ENCOUNTER — Emergency Department (HOSPITAL_BASED_OUTPATIENT_CLINIC_OR_DEPARTMENT_OTHER)
Admission: EM | Admit: 2010-10-07 | Discharge: 2010-10-07 | Payer: Self-pay | Source: Home / Self Care | Admitting: Emergency Medicine

## 2010-10-15 ENCOUNTER — Ambulatory Visit: Payer: Self-pay | Admitting: Hematology & Oncology

## 2010-10-15 LAB — CBC WITH DIFFERENTIAL (CANCER CENTER ONLY)
BASO#: 0 10*3/uL (ref 0.0–0.2)
BASO%: 0.3 % (ref 0.0–2.0)
EOS%: 3.7 % (ref 0.0–7.0)
Eosinophils Absolute: 0.1 10*3/uL (ref 0.0–0.5)
LYMPH#: 0.5 10*3/uL — ABNORMAL LOW (ref 0.9–3.3)
LYMPH%: 27.6 % (ref 14.0–48.0)
MONO#: 0.2 10*3/uL (ref 0.1–0.9)
MONO%: 10.3 % (ref 0.0–13.0)
NEUT%: 58.1 % (ref 39.6–80.0)
RDW: 12.9 % (ref 10.5–14.6)

## 2010-10-15 LAB — FERRITIN: Ferritin: 1039 ng/mL — ABNORMAL HIGH (ref 10–291)

## 2010-10-15 LAB — RETICULOCYTES (CHCC)
RBC.: 3.32 MIL/uL — ABNORMAL LOW (ref 3.87–5.11)
Retic Ct Pct: 1.3 % (ref 0.4–3.1)

## 2010-11-23 ENCOUNTER — Ambulatory Visit (HOSPITAL_BASED_OUTPATIENT_CLINIC_OR_DEPARTMENT_OTHER): Payer: Medicaid Other | Admitting: Hematology & Oncology

## 2010-11-24 ENCOUNTER — Encounter (HOSPITAL_BASED_OUTPATIENT_CLINIC_OR_DEPARTMENT_OTHER): Payer: Medicaid Other | Admitting: Hematology & Oncology

## 2010-11-24 DIAGNOSIS — D61818 Other pancytopenia: Secondary | ICD-10-CM

## 2010-11-24 DIAGNOSIS — N189 Chronic kidney disease, unspecified: Secondary | ICD-10-CM

## 2010-11-24 LAB — CBC WITH DIFFERENTIAL (CANCER CENTER ONLY)
BASO#: 0 10*3/uL (ref 0.0–0.2)
LYMPH%: 28.2 % (ref 14.0–48.0)
MCV: 90 fL (ref 81–101)
MONO%: 10.4 % (ref 0.0–13.0)
RDW: 12.6 % (ref 10.5–14.6)

## 2010-12-23 ENCOUNTER — Encounter (HOSPITAL_BASED_OUTPATIENT_CLINIC_OR_DEPARTMENT_OTHER): Payer: Medicaid Other | Admitting: Hematology & Oncology

## 2010-12-23 ENCOUNTER — Other Ambulatory Visit: Payer: Self-pay | Admitting: Family

## 2010-12-23 DIAGNOSIS — Z944 Liver transplant status: Secondary | ICD-10-CM

## 2010-12-23 DIAGNOSIS — D61818 Other pancytopenia: Secondary | ICD-10-CM

## 2010-12-23 DIAGNOSIS — D631 Anemia in chronic kidney disease: Secondary | ICD-10-CM

## 2010-12-23 DIAGNOSIS — N189 Chronic kidney disease, unspecified: Secondary | ICD-10-CM

## 2010-12-23 LAB — CBC WITH DIFFERENTIAL (CANCER CENTER ONLY)
BASO%: 0 % (ref 0.0–2.0)
EOS%: 1.5 % (ref 0.0–7.0)
Eosinophils Absolute: 0 10*3/uL (ref 0.0–0.5)
HCT: 29.7 % — ABNORMAL LOW (ref 34.8–46.6)
LYMPH%: 20.6 % (ref 14.0–48.0)
MCH: 29.1 pg (ref 26.0–34.0)
MONO%: 13.6 % — ABNORMAL HIGH (ref 0.0–13.0)
Platelets: 103 10*3/uL — ABNORMAL LOW (ref 145–400)
RBC: 3.44 10*6/uL — ABNORMAL LOW (ref 3.70–5.32)

## 2011-01-13 ENCOUNTER — Encounter (HOSPITAL_BASED_OUTPATIENT_CLINIC_OR_DEPARTMENT_OTHER): Payer: Medicaid Other | Admitting: Hematology & Oncology

## 2011-01-13 ENCOUNTER — Other Ambulatory Visit: Payer: Self-pay | Admitting: Hematology & Oncology

## 2011-01-13 DIAGNOSIS — D61818 Other pancytopenia: Secondary | ICD-10-CM

## 2011-01-13 LAB — CBC WITH DIFFERENTIAL (CANCER CENTER ONLY)
BASO%: 0 % (ref 0.0–2.0)
EOS%: 2.4 % (ref 0.0–7.0)
HCT: 34 % — ABNORMAL LOW (ref 34.8–46.6)
HGB: 11.6 g/dL (ref 11.6–15.9)
LYMPH#: 0.5 10*3/uL — ABNORMAL LOW (ref 0.9–3.3)
MCHC: 34.1 g/dL (ref 32.0–36.0)
MONO#: 0.3 10*3/uL (ref 0.1–0.9)
NEUT#: 0.8 10*3/uL — ABNORMAL LOW (ref 1.5–6.5)
NEUT%: 50.1 % (ref 39.6–80.0)
Platelets: 125 10*3/uL — ABNORMAL LOW (ref 145–400)
RBC: 3.95 10*6/uL (ref 3.70–5.32)
RDW: 13.2 % (ref 11.1–15.7)
WBC: 1.6 10*3/uL — ABNORMAL LOW (ref 3.9–10.0)

## 2011-02-10 ENCOUNTER — Encounter (HOSPITAL_BASED_OUTPATIENT_CLINIC_OR_DEPARTMENT_OTHER): Payer: Medicaid Other | Admitting: Hematology & Oncology

## 2011-02-10 ENCOUNTER — Other Ambulatory Visit: Payer: Self-pay | Admitting: Hematology & Oncology

## 2011-02-10 DIAGNOSIS — N189 Chronic kidney disease, unspecified: Secondary | ICD-10-CM

## 2011-02-10 DIAGNOSIS — Z944 Liver transplant status: Secondary | ICD-10-CM

## 2011-02-10 DIAGNOSIS — D61818 Other pancytopenia: Secondary | ICD-10-CM

## 2011-02-10 DIAGNOSIS — D631 Anemia in chronic kidney disease: Secondary | ICD-10-CM

## 2011-02-10 LAB — IRON AND TIBC
Iron: 160 ug/dL — ABNORMAL HIGH (ref 42–145)
UIBC: <55 ug/dL

## 2011-02-10 LAB — CBC WITH DIFFERENTIAL (CANCER CENTER ONLY)
BASO#: 0 10*3/uL (ref 0.0–0.2)
BASO%: 0.6 % (ref 0.0–2.0)
Eosinophils Absolute: 0.1 10*3/uL (ref 0.0–0.5)
HGB: 10.4 g/dL — ABNORMAL LOW (ref 11.6–15.9)
LYMPH%: 28.9 % (ref 14.0–48.0)
MCH: 28.7 pg (ref 26.0–34.0)
NEUT#: 0.9 10*3/uL — ABNORMAL LOW (ref 1.5–6.5)
NEUT%: 51.2 % (ref 39.6–80.0)
RBC: 3.62 10*6/uL — ABNORMAL LOW (ref 3.70–5.32)
RDW: 12.4 % (ref 11.1–15.7)
WBC: 1.7 10*3/uL — ABNORMAL LOW (ref 3.9–10.0)

## 2011-02-10 LAB — RETICULOCYTES (CHCC)
ABS Retic: 14.7 10*3/uL — ABNORMAL LOW (ref 19.0–186.0)
RBC.: 3.67 MIL/uL — ABNORMAL LOW (ref 3.87–5.11)
Retic Ct Pct: 0.4 % (ref 0.4–3.1)

## 2011-02-10 LAB — FERRITIN: Ferritin: 975 ng/mL — ABNORMAL HIGH (ref 10–291)

## 2011-03-18 ENCOUNTER — Encounter: Payer: Medicaid Other | Admitting: Hematology & Oncology

## 2011-03-18 ENCOUNTER — Other Ambulatory Visit: Payer: Self-pay | Admitting: Hematology & Oncology

## 2011-03-18 LAB — CBC WITH DIFFERENTIAL (CANCER CENTER ONLY)
BASO%: 0.4 % (ref 0.0–2.0)
EOS%: 1.6 % (ref 0.0–7.0)
HCT: 31.5 % — ABNORMAL LOW (ref 34.8–46.6)
LYMPH%: 26.5 % (ref 14.0–48.0)
MCH: 29.4 pg (ref 26.0–34.0)
MCHC: 34.6 g/dL (ref 32.0–36.0)
MCV: 85 fL (ref 81–101)
MONO%: 12.9 % (ref 0.0–13.0)
NEUT%: 58.6 % (ref 39.6–80.0)
Platelets: 133 10*3/uL — ABNORMAL LOW (ref 145–400)
RDW: 13.2 % (ref 11.1–15.7)
WBC: 2.5 10*3/uL — ABNORMAL LOW (ref 3.9–10.0)

## 2011-03-20 ENCOUNTER — Emergency Department (HOSPITAL_BASED_OUTPATIENT_CLINIC_OR_DEPARTMENT_OTHER)
Admission: EM | Admit: 2011-03-20 | Discharge: 2011-03-20 | Disposition: A | Discharge status: Home / Self Care | Payer: Medicaid Other | Attending: Emergency Medicine | Admitting: Emergency Medicine

## 2011-03-20 DIAGNOSIS — I10 Essential (primary) hypertension: Secondary | ICD-10-CM | POA: Insufficient documentation

## 2011-03-20 DIAGNOSIS — Z79899 Other long term (current) drug therapy: Secondary | ICD-10-CM | POA: Insufficient documentation

## 2011-03-20 DIAGNOSIS — X503XXA Overexertion from repetitive movements, initial encounter: Secondary | ICD-10-CM | POA: Insufficient documentation

## 2011-03-20 DIAGNOSIS — Y92009 Unspecified place in unspecified non-institutional (private) residence as the place of occurrence of the external cause: Secondary | ICD-10-CM | POA: Insufficient documentation

## 2011-03-20 DIAGNOSIS — M549 Dorsalgia, unspecified: Secondary | ICD-10-CM | POA: Insufficient documentation

## 2011-04-14 ENCOUNTER — Encounter (HOSPITAL_BASED_OUTPATIENT_CLINIC_OR_DEPARTMENT_OTHER): Payer: Medicaid Other | Admitting: Hematology & Oncology

## 2011-04-14 ENCOUNTER — Other Ambulatory Visit: Payer: Self-pay | Admitting: Hematology & Oncology

## 2011-04-14 DIAGNOSIS — N039 Chronic nephritic syndrome with unspecified morphologic changes: Secondary | ICD-10-CM

## 2011-04-14 DIAGNOSIS — N189 Chronic kidney disease, unspecified: Secondary | ICD-10-CM

## 2011-04-14 DIAGNOSIS — Z944 Liver transplant status: Secondary | ICD-10-CM

## 2011-04-14 DIAGNOSIS — D61818 Other pancytopenia: Secondary | ICD-10-CM

## 2011-04-14 LAB — CHCC SATELLITE - SMEAR

## 2011-04-14 LAB — CBC WITH DIFFERENTIAL (CANCER CENTER ONLY)
BASO%: 0 % (ref 0.0–2.0)
EOS%: 6.5 % (ref 0.0–7.0)
HCT: 28.1 % — ABNORMAL LOW (ref 34.8–46.6)
LYMPH#: 0.6 10*3/uL — ABNORMAL LOW (ref 0.9–3.3)
MCHC: 35.2 g/dL (ref 32.0–36.0)
MONO#: 0.4 10*3/uL (ref 0.1–0.9)
NEUT%: 54.1 % (ref 39.6–80.0)
Platelets: 159 10*3/uL (ref 145–400)
RDW: 13.1 % (ref 11.1–15.7)
WBC: 2.3 10*3/uL — ABNORMAL LOW (ref 3.9–10.0)

## 2011-05-19 ENCOUNTER — Other Ambulatory Visit: Payer: Self-pay | Admitting: Hematology & Oncology

## 2011-05-19 ENCOUNTER — Encounter (HOSPITAL_BASED_OUTPATIENT_CLINIC_OR_DEPARTMENT_OTHER): Payer: Medicaid Other | Admitting: Hematology & Oncology

## 2011-05-19 DIAGNOSIS — D638 Anemia in other chronic diseases classified elsewhere: Secondary | ICD-10-CM

## 2011-05-19 DIAGNOSIS — N189 Chronic kidney disease, unspecified: Secondary | ICD-10-CM

## 2011-05-19 DIAGNOSIS — D631 Anemia in chronic kidney disease: Secondary | ICD-10-CM

## 2011-05-19 DIAGNOSIS — D61818 Other pancytopenia: Secondary | ICD-10-CM

## 2011-05-19 DIAGNOSIS — D649 Anemia, unspecified: Secondary | ICD-10-CM

## 2011-05-19 LAB — CBC WITH DIFFERENTIAL (CANCER CENTER ONLY)
BASO#: 0 10*3/uL (ref 0.0–0.2)
EOS%: 3.7 % (ref 0.0–7.0)
Eosinophils Absolute: 0.1 10*3/uL (ref 0.0–0.5)
HCT: 28.5 % — ABNORMAL LOW (ref 34.8–46.6)
HGB: 10 g/dL — ABNORMAL LOW (ref 11.6–15.9)
MCH: 31.1 pg (ref 26.0–34.0)
MCHC: 35.1 g/dL (ref 32.0–36.0)
MONO%: 13.1 % — ABNORMAL HIGH (ref 0.0–13.0)
NEUT#: 1.3 10*3/uL — ABNORMAL LOW (ref 1.5–6.5)
NEUT%: 59.8 % (ref 39.6–80.0)
RBC: 3.22 10*6/uL — ABNORMAL LOW (ref 3.70–5.32)

## 2011-07-01 ENCOUNTER — Other Ambulatory Visit: Payer: Self-pay | Admitting: Family

## 2011-07-01 ENCOUNTER — Encounter: Payer: Medicaid Other | Admitting: Hematology & Oncology

## 2011-07-01 LAB — CBC WITH DIFFERENTIAL (CANCER CENTER ONLY)
BASO#: 0 10*3/uL (ref 0.0–0.2)
EOS%: 1.6 % (ref 0.0–7.0)
HGB: 11.1 g/dL — ABNORMAL LOW (ref 11.6–15.9)
LYMPH%: 27.5 % (ref 14.0–48.0)
MCH: 30.5 pg (ref 26.0–34.0)
MCHC: 34.6 g/dL (ref 32.0–36.0)
MONO%: 12.1 % (ref 0.0–13.0)
NEUT#: 1.5 10*3/uL (ref 1.5–6.5)
Platelets: 125 10*3/uL — ABNORMAL LOW (ref 145–400)

## 2011-07-01 LAB — COMPREHENSIVE METABOLIC PANEL
AST: 20 U/L (ref 0–37)
Albumin: 3.7 g/dL (ref 3.5–5.2)
Alkaline Phosphatase: 60 U/L (ref 39–117)
BUN: 35 mg/dL — ABNORMAL HIGH (ref 6–23)
Potassium: 4.2 mEq/L (ref 3.5–5.3)
Total Bilirubin: 0.4 mg/dL (ref 0.3–1.2)

## 2011-07-18 ENCOUNTER — Encounter (HOSPITAL_BASED_OUTPATIENT_CLINIC_OR_DEPARTMENT_OTHER): Payer: Medicaid Other | Admitting: Hematology & Oncology

## 2011-07-18 ENCOUNTER — Other Ambulatory Visit: Payer: Self-pay | Admitting: Family

## 2011-07-18 DIAGNOSIS — N189 Chronic kidney disease, unspecified: Secondary | ICD-10-CM

## 2011-07-18 DIAGNOSIS — N039 Chronic nephritic syndrome with unspecified morphologic changes: Secondary | ICD-10-CM

## 2011-07-18 LAB — CBC WITH DIFFERENTIAL (CANCER CENTER ONLY)
BASO#: 0 10*3/uL (ref 0.0–0.2)
EOS%: 2 % (ref 0.0–7.0)
Eosinophils Absolute: 0.1 10*3/uL (ref 0.0–0.5)
HCT: 27.3 % — ABNORMAL LOW (ref 34.8–46.6)
HGB: 9.5 g/dL — ABNORMAL LOW (ref 11.6–15.9)
LYMPH%: 16.7 % (ref 14.0–48.0)
MCH: 30.3 pg (ref 26.0–34.0)
MCHC: 34.8 g/dL (ref 32.0–36.0)
MCV: 87 fL (ref 81–101)
MONO%: 12.7 % (ref 0.0–13.0)
NEUT#: 1.7 10*3/uL (ref 1.5–6.5)
NEUT%: 68.6 % (ref 39.6–80.0)

## 2011-07-19 ENCOUNTER — Encounter (HOSPITAL_BASED_OUTPATIENT_CLINIC_OR_DEPARTMENT_OTHER): Payer: Medicaid Other | Admitting: Hematology & Oncology

## 2011-08-13 ENCOUNTER — Emergency Department (HOSPITAL_BASED_OUTPATIENT_CLINIC_OR_DEPARTMENT_OTHER)
Admission: EM | Admit: 2011-08-13 | Discharge: 2011-08-13 | Disposition: A | Discharge status: Home / Self Care | Payer: Medicaid Other | Attending: Emergency Medicine | Admitting: Emergency Medicine

## 2011-08-13 ENCOUNTER — Emergency Department (INDEPENDENT_AMBULATORY_CARE_PROVIDER_SITE_OTHER): Payer: Medicaid Other

## 2011-08-13 ENCOUNTER — Encounter: Payer: Self-pay | Admitting: *Deleted

## 2011-08-13 DIAGNOSIS — S8990XA Unspecified injury of unspecified lower leg, initial encounter: Secondary | ICD-10-CM

## 2011-08-13 DIAGNOSIS — X500XXA Overexertion from strenuous movement or load, initial encounter: Secondary | ICD-10-CM

## 2011-08-13 DIAGNOSIS — M25569 Pain in unspecified knee: Secondary | ICD-10-CM

## 2011-08-13 DIAGNOSIS — S99929A Unspecified injury of unspecified foot, initial encounter: Secondary | ICD-10-CM | POA: Insufficient documentation

## 2011-08-13 DIAGNOSIS — S99919A Unspecified injury of unspecified ankle, initial encounter: Secondary | ICD-10-CM

## 2011-08-13 HISTORY — DX: Essential (primary) hypertension: I10

## 2011-08-13 MED ORDER — HYDROCODONE-ACETAMINOPHEN 5-325 MG PO TABS
1.0000 | ORAL_TABLET | Freq: Once | ORAL | Status: AC
  Administered 2011-08-13: 1 via ORAL
  Filled 2011-08-13: qty 1

## 2011-08-13 MED ORDER — OXYCODONE-ACETAMINOPHEN 5-325 MG PO TABS: 1.0000 | ORAL_TABLET | ORAL | Status: AC | PRN

## 2011-08-13 NOTE — ED Provider Notes (Signed)
History     CSN: [id number] Arrival date & time: 08/13/2011 10:18 AM   First MD Initiated Contact with Patient 08/13/11 [place] room 1  Chief Complaint  Patient presents with  . Knee Pain    (Consider location/radiation/quality/duration/timing/severity/associated sxs/prior treatment) HPI Patient with pain left knee after twisting left  Knee while walking on Thursday.  No fall.  Pain is aching in nature worse at night.  Pain 7/10.  No radiation.  Present for two days.  No swelling , redness or warmth.  Patient took tylenol with some relief. No past medical history on file. S/p liver transplant.1996  s/p hep c 1996 hypertension No past surgical history on file. Liver tx Hysterectomy 1981 No family history on file.  History  Substance Use Topics  . Smoking status: Not on file  . Smokeless tobacco: Not on file  . Alcohol Use: Not on file   No tobacco or etoh or street drugs OB History    No data available    Meds Neoral 12.5 mg  Blood pressure med-? Review of Systems  All other systems reviewed and are negative.    Allergies  Review of patient's allergies indicates not on file. nkda Home Medications  No current outpatient prescriptions on file. pmd- Dr.  Dorna Mai sp? BP 151/87  Pulse 79  Temp(Src) 98 F (36.7 C) (Oral)  Resp 18  Ht 5' (1.524 m)  Wt 140 lb (63.504 kg)  BMI 27.34 kg/m2  SpO2 99%  Physical Exam  Nursing note and vitals reviewed. Constitutional: She is oriented to person, place, and time. She appears well-developed and well-nourished.  HENT:  Head: Normocephalic and atraumatic.  Eyes: Conjunctivae are normal. Pupils are equal, round, and reactive to light.  Neck: Normal range of motion. Neck supple.  Cardiovascular: Normal rate and regular rhythm.   Pulmonary/Chest: Effort normal and breath sounds normal.  Abdominal: Soft.  Musculoskeletal: Normal range of motion.       Some tenderness medial aspect of left knee.  Negative  drawer sign.  No laxity noted of medial or lateral ligament.   Neurological: She is alert and oriented to person, place, and time. She has normal reflexes.  Skin: Skin is warm and dry.  Psychiatric: She has a normal mood and affect. Her behavior is normal. Judgment and thought content normal.    ED Course  Procedures (including critical care time)  Labs Reviewed - No data to display No results found.   No diagnosis found.    MDM          Shaune Pollack, MD 08/13/11 [phone]

## 2011-08-13 NOTE — ED Notes (Signed)
Patient states she was walking with a friend and twisted L leg, when she stepped in a hole, Knee and upper leg continue to hurt, took extra strength tylenol, no relief

## 2011-08-19 ENCOUNTER — Other Ambulatory Visit: Payer: Self-pay | Admitting: Family

## 2011-08-19 ENCOUNTER — Encounter (HOSPITAL_BASED_OUTPATIENT_CLINIC_OR_DEPARTMENT_OTHER): Payer: Medicaid Other | Admitting: Hematology & Oncology

## 2011-08-19 DIAGNOSIS — N189 Chronic kidney disease, unspecified: Secondary | ICD-10-CM

## 2011-08-19 DIAGNOSIS — D631 Anemia in chronic kidney disease: Secondary | ICD-10-CM

## 2011-08-19 DIAGNOSIS — D61818 Other pancytopenia: Secondary | ICD-10-CM

## 2011-08-19 DIAGNOSIS — Z944 Liver transplant status: Secondary | ICD-10-CM

## 2011-08-19 LAB — CBC WITH DIFFERENTIAL (CANCER CENTER ONLY)
BASO#: 0 10*3/uL (ref 0.0–0.2)
Eosinophils Absolute: 0 10*3/uL (ref 0.0–0.5)
HCT: 29.9 % — ABNORMAL LOW (ref 34.8–46.6)
HGB: 10.3 g/dL — ABNORMAL LOW (ref 11.6–15.9)
LYMPH%: 32.5 % (ref 14.0–48.0)
MCH: 30.6 pg (ref 26.0–34.0)
MCV: 89 fL (ref 81–101)
MONO#: 0.2 10*3/uL (ref 0.1–0.9)
MONO%: 14.6 % — ABNORMAL HIGH (ref 0.0–13.0)
Platelets: 82 10*3/uL — ABNORMAL LOW (ref 145–400)
RBC: 3.37 10*6/uL — ABNORMAL LOW (ref 3.70–5.32)
WBC: 1.5 10*3/uL — ABNORMAL LOW (ref 3.9–10.0)

## 2011-09-28 ENCOUNTER — Other Ambulatory Visit (HOSPITAL_BASED_OUTPATIENT_CLINIC_OR_DEPARTMENT_OTHER): Payer: Medicaid Other | Admitting: Lab

## 2011-09-28 ENCOUNTER — Other Ambulatory Visit: Payer: Self-pay | Admitting: Family

## 2011-09-28 ENCOUNTER — Ambulatory Visit (HOSPITAL_BASED_OUTPATIENT_CLINIC_OR_DEPARTMENT_OTHER): Payer: Medicaid Other | Admitting: Hematology & Oncology

## 2011-09-28 DIAGNOSIS — D631 Anemia in chronic kidney disease: Secondary | ICD-10-CM

## 2011-09-28 DIAGNOSIS — N189 Chronic kidney disease, unspecified: Secondary | ICD-10-CM

## 2011-09-28 DIAGNOSIS — D61818 Other pancytopenia: Secondary | ICD-10-CM

## 2011-09-28 DIAGNOSIS — D619 Aplastic anemia, unspecified: Secondary | ICD-10-CM | POA: Insufficient documentation

## 2011-09-28 LAB — COMPREHENSIVE METABOLIC PANEL
Alkaline Phosphatase: 74 U/L (ref 39–117)
CO2: 21 mEq/L (ref 19–32)
Creatinine, Ser: 1.27 mg/dL — ABNORMAL HIGH (ref 0.50–1.10)
Glucose, Bld: 100 mg/dL — ABNORMAL HIGH (ref 70–99)
Total Bilirubin: 0.6 mg/dL (ref 0.3–1.2)

## 2011-09-28 LAB — CBC WITH DIFFERENTIAL (CANCER CENTER ONLY)
BASO%: 0 % (ref 0.0–2.0)
Eosinophils Absolute: 0 10*3/uL (ref 0.0–0.5)
HCT: 32.3 % — ABNORMAL LOW (ref 34.8–46.6)
LYMPH%: 28.7 % (ref 14.0–48.0)
MCH: 29.9 pg (ref 26.0–34.0)
MCV: 87 fL (ref 81–101)
MONO#: 0.3 10*3/uL (ref 0.1–0.9)
MONO%: 14.9 % — ABNORMAL HIGH (ref 0.0–13.0)
NEUT%: 54.7 % (ref 39.6–80.0)
Platelets: 94 10*3/uL — ABNORMAL LOW (ref 145–400)
RDW: 12.1 % (ref 11.1–15.7)
WBC: 1.8 10*3/uL — ABNORMAL LOW (ref 3.9–10.0)

## 2011-09-28 NOTE — Progress Notes (Signed)
CC:   Baltazar Najjar, N.P.  DIAGNOSIS: 1. Anemia secondary to renal insufficiency. 2. Chronic pancytopenia. 3. Autoimmune hepatitis, status post liver transplant.  CURRENT THERAPY:  Aranesp 300 mcg subcu as needed for hemoglobin less than 11.  INTERIM HISTORY:  Ms. Zurfluh comes in for followup.  She is doing real well.  She had a great time up in [place].  She was up there for Thanksgiving and the [employer].  Her daughter paid for everything. Apparently, she has a daughter back in [place].  Hopefully, she will be home in September.  Ms. Cheese feels fairly well.  She has not noted any problems with bleeding or bruising.  There is no fever.  She has had no cough.  There has been no leg swelling.  She has had no abdominal pain.  There is no change in bowel or bladder habits.  PHYSICAL EXAMINATION:  General:  This is a well-developed, well- nourished black female in no obvious distress.  Vital Signs: Temperature of 98, pulse 90, respiratory rate 20, blood pressure 130/87. Weight is 138.  Head and Neck Exam:  Shows a normocephalic, atraumatic skull.  There are no ocular or oral lesions.  There are no palpable cervical or supraclavicular lymph nodes.  Lungs:  Clear to percussion and auscultation bilaterally.  Cardiac Exam:  Regular rate and rhythm with normal S1 and S2.  There are no murmurs, rubs or bruits.  Abdominal Exam:  Soft with good bowel sounds.  There is no palpable abdominal mass.  There is no fluid wave.  There is no palpable hepatosplenomegaly. She has well-healed laparotomy scars from her transplant surgery. Extremities:  Show no clubbing, cyanosis or edema.  Skin Exam:  No rashes, ecchymosis or petechiae.  LABORATORY STUDIES:  White cell count is 1.8, hemoglobin 11.1, hematocrit 32.3, platelet count 94,000.  MCV is 87.  IMPRESSION:  Ms. Dorrance is a 55 year old African American female with anemia of renal insufficiency.  She does not need any  Aranesp today.  I think she last got Aranesp probably back in July or August.  Her blood counts are holding on pretty well right now.  They do tend to fluctuate a little bit.  We will plan to get her back monthly for a CBC check.  I will plan to see her back myself in another 2 months.    ______________________________ [name], M.D. PRE/MEDQ  D:  09/28/2011  T:  09/28/2011  Job:  [id number]

## 2011-09-28 NOTE — Progress Notes (Signed)
This office note has been dictated.

## 2011-10-26 ENCOUNTER — Other Ambulatory Visit: Payer: Medicaid Other | Admitting: Lab

## 2011-10-26 ENCOUNTER — Ambulatory Visit (HOSPITAL_BASED_OUTPATIENT_CLINIC_OR_DEPARTMENT_OTHER): Payer: Medicaid Other

## 2011-10-26 DIAGNOSIS — D631 Anemia in chronic kidney disease: Secondary | ICD-10-CM

## 2011-10-26 DIAGNOSIS — N189 Chronic kidney disease, unspecified: Secondary | ICD-10-CM

## 2011-10-26 DIAGNOSIS — N039 Chronic nephritic syndrome with unspecified morphologic changes: Secondary | ICD-10-CM

## 2011-10-26 LAB — CBC WITH DIFFERENTIAL (CANCER CENTER ONLY)
BASO#: 0 10*3/uL (ref 0.0–0.2)
Eosinophils Absolute: 0 10*3/uL (ref 0.0–0.5)
HCT: 29.6 % — ABNORMAL LOW (ref 34.8–46.6)
LYMPH%: 21.5 % (ref 14.0–48.0)
MCH: 30.6 pg (ref 26.0–34.0)
MCV: 89 fL (ref 81–101)
MONO#: 0.3 10*3/uL (ref 0.1–0.9)
NEUT%: 61.3 % (ref 39.6–80.0)
RBC: 3.33 10*6/uL — ABNORMAL LOW (ref 3.70–5.32)
WBC: 1.9 10*3/uL — ABNORMAL LOW (ref 3.9–10.0)

## 2011-10-26 MED ORDER — DARBEPOETIN ALFA-POLYSORBATE 500 MCG/ML IJ SOLN
300.0000 ug | Freq: Once | INTRAMUSCULAR | Status: AC
  Administered 2011-10-26: 300 ug via SUBCUTANEOUS
  Filled 2011-10-26: qty 1

## 2011-11-16 ENCOUNTER — Telehealth: Payer: Self-pay | Admitting: Hematology & Oncology

## 2011-11-16 ENCOUNTER — Ambulatory Visit: Payer: Medicaid Other

## 2011-11-16 NOTE — Telephone Encounter (Signed)
1-23 inj moved to 1-29 she is sick

## 2011-11-22 ENCOUNTER — Ambulatory Visit: Payer: Medicaid Other

## 2011-11-22 ENCOUNTER — Other Ambulatory Visit: Payer: Self-pay | Admitting: Hematology & Oncology

## 2011-11-22 ENCOUNTER — Other Ambulatory Visit (HOSPITAL_BASED_OUTPATIENT_CLINIC_OR_DEPARTMENT_OTHER): Payer: Medicaid Other | Admitting: Lab

## 2011-11-22 DIAGNOSIS — D631 Anemia in chronic kidney disease: Secondary | ICD-10-CM

## 2011-11-22 DIAGNOSIS — N189 Chronic kidney disease, unspecified: Secondary | ICD-10-CM

## 2011-11-22 DIAGNOSIS — G4701 Insomnia due to medical condition: Secondary | ICD-10-CM

## 2011-11-22 LAB — CBC WITH DIFFERENTIAL (CANCER CENTER ONLY)
BASO%: 0.4 % (ref 0.0–2.0)
HCT: 33 % — ABNORMAL LOW (ref 34.8–46.6)
LYMPH%: 18.9 % (ref 14.0–48.0)
MCH: 30.7 pg (ref 26.0–34.0)
MCHC: 34.8 g/dL (ref 32.0–36.0)
MCV: 88 fL (ref 81–101)
MONO#: 0.4 10*3/uL (ref 0.1–0.9)
MONO%: 14.8 % — ABNORMAL HIGH (ref 0.0–13.0)
NEUT%: 64.8 % (ref 39.6–80.0)
RDW: 12.7 % (ref 11.1–15.7)

## 2011-11-22 MED ORDER — DARBEPOETIN ALFA-POLYSORBATE 500 MCG/ML IJ SOLN: 300.0000 ug | Freq: Once | INTRAMUSCULAR | Status: DC

## 2011-11-22 MED ORDER — TEMAZEPAM 30 MG PO CAPS: ORAL_CAPSULE | ORAL | Status: DC

## 2011-11-22 NOTE — Progress Notes (Signed)
Aranesp held for Hgb >11 (11.5).

## 2011-12-07 ENCOUNTER — Ambulatory Visit: Payer: Medicaid Other

## 2011-12-08 ENCOUNTER — Ambulatory Visit (HOSPITAL_BASED_OUTPATIENT_CLINIC_OR_DEPARTMENT_OTHER): Payer: Medicaid Other | Admitting: Hematology & Oncology

## 2011-12-08 ENCOUNTER — Other Ambulatory Visit (HOSPITAL_BASED_OUTPATIENT_CLINIC_OR_DEPARTMENT_OTHER): Payer: Medicaid Other | Admitting: Lab

## 2011-12-08 ENCOUNTER — Ambulatory Visit (HOSPITAL_BASED_OUTPATIENT_CLINIC_OR_DEPARTMENT_OTHER): Payer: Medicaid Other

## 2011-12-08 ENCOUNTER — Encounter: Payer: Self-pay | Admitting: Hematology & Oncology

## 2011-12-08 DIAGNOSIS — N189 Chronic kidney disease, unspecified: Secondary | ICD-10-CM

## 2011-12-08 DIAGNOSIS — N039 Chronic nephritic syndrome with unspecified morphologic changes: Secondary | ICD-10-CM

## 2011-12-08 DIAGNOSIS — D631 Anemia in chronic kidney disease: Secondary | ICD-10-CM

## 2011-12-08 DIAGNOSIS — K754 Autoimmune hepatitis: Secondary | ICD-10-CM

## 2011-12-08 DIAGNOSIS — Z944 Liver transplant status: Secondary | ICD-10-CM

## 2011-12-08 DIAGNOSIS — B182 Chronic viral hepatitis C: Secondary | ICD-10-CM

## 2011-12-08 HISTORY — DX: Autoimmune hepatitis: K75.4

## 2011-12-08 LAB — CBC WITH DIFFERENTIAL (CANCER CENTER ONLY)
BASO%: 0 % (ref 0.0–2.0)
EOS%: 1.3 % (ref 0.0–7.0)
HCT: 31.6 % — ABNORMAL LOW (ref 34.8–46.6)
LYMPH#: 0.6 10*3/uL — ABNORMAL LOW (ref 0.9–3.3)
LYMPH%: 26.3 % (ref 14.0–48.0)
MCHC: 34.5 g/dL (ref 32.0–36.0)
NEUT%: 59.5 % (ref 39.6–80.0)
RDW: 12.4 % (ref 11.1–15.7)

## 2011-12-08 MED ORDER — PROMETHAZINE HCL 12.5 MG PO TABS: 12.5000 mg | ORAL_TABLET | Freq: Four times a day (QID) | ORAL | Status: DC | PRN

## 2011-12-08 MED ORDER — DARBEPOETIN ALFA-POLYSORBATE 300 MCG/0.6ML IJ SOLN
300.0000 ug | Freq: Once | INTRAMUSCULAR | Status: AC
  Administered 2011-12-08: 300 ug via SUBCUTANEOUS
  Filled 2011-12-08: qty 1

## 2011-12-08 NOTE — Progress Notes (Signed)
This office note has been dictated.

## 2011-12-08 NOTE — Progress Notes (Signed)
CC:   Baltazar Najjar, N.P.  DIAGNOSES: 1. Pancytopenia. 2. Patient status post liver transplant. 3. Anemia secondary to renal insufficiency.  CURRENT THERAPY:  Aranesp 300 mcg subcutaneously as needed for a hemoglobin less than 11.  INTERIM HISTORY:  Tami [name] comes in for her followup.  She is doing pretty well.  She does feel a little bit tired.  She is having some nausea.  She says this has happened to her on occasion ever since she had the liver transplant.  She does not take anything for it.  She would like to have some Phenergan.  We did send a prescription to her pharmacy.  She has not noted any problems with bleeding.  There has been no change in bowel or bladder habits.  She has had no fever.  She has had no bony pain.  There has been no vomiting, just nausea.  Her last Aranesp was given back on January 29th.  PHYSICAL EXAMINATION:  General Appearance:  This is a petite, [name] in no obvious distress.  Vital Signs:  98.2, pulse 76, respiratory rate 20, blood pressure 136/89.  Weight is 137.  Head and Neck Exam:  A normocephalic, atraumatic skull.  There are no ocular or oral lesions.  There are no palpable cervical or supraclavicular lymph nodes.  Lungs:  Clear to percussion and auscultation bilaterally. Cardiac Exam:  Regular rate and rhythm with a normal S1 and S2.  There are no murmurs, rubs, or bruits.  Abdominal Exam:  Soft with good bowel sounds.  There is no palpable abdominal mass.  There is no palpable hepatosplenomegaly.  She has a well-healed laparotomy scar. Extremities:  No clubbing, cyanosis, or edema.  Neurological Exam:  No focal neurological deficits.  LABORATORY STUDIES:  White cell count is 2.3, hemoglobin 10.9, hematocrit 31.5, platelet count is 152.  IMPRESSION:  Tami [name] is a 56 year old, African American female with chronic anemia secondary to renal insufficiency.  She also has a history of autoimmune hepatitis and  has had a liver transplant.  She does have some pancytopenia secondary to the liver transplant but this really has not been a real issue for her.  We will go ahead and give her Aranesp today.  We will then plan for additional followup.  We will have her come back in 3 weeks for another CBC.  I will probably see her back myself in about 6 weeks or so.    ______________________________ [name], M.D. PRE/MEDQ  D:  12/08/2011  T:  12/08/2011  Job:  [id number]

## 2011-12-27 ENCOUNTER — Other Ambulatory Visit: Payer: Self-pay | Admitting: *Deleted

## 2011-12-27 DIAGNOSIS — N189 Chronic kidney disease, unspecified: Secondary | ICD-10-CM

## 2011-12-28 ENCOUNTER — Ambulatory Visit: Payer: Medicaid Other

## 2011-12-28 ENCOUNTER — Other Ambulatory Visit: Payer: Medicaid Other | Admitting: Lab

## 2011-12-28 NOTE — Progress Notes (Signed)
Tami [name] did not came today for her injection

## 2012-01-19 ENCOUNTER — Other Ambulatory Visit (HOSPITAL_BASED_OUTPATIENT_CLINIC_OR_DEPARTMENT_OTHER): Payer: Medicaid Other | Admitting: Lab

## 2012-01-19 ENCOUNTER — Ambulatory Visit (HOSPITAL_BASED_OUTPATIENT_CLINIC_OR_DEPARTMENT_OTHER): Payer: Medicaid Other

## 2012-01-19 VITALS — BP 132/75 | HR 89 | Temp 97.3°F

## 2012-01-19 DIAGNOSIS — D649 Anemia, unspecified: Secondary | ICD-10-CM

## 2012-01-19 DIAGNOSIS — D631 Anemia in chronic kidney disease: Secondary | ICD-10-CM

## 2012-01-19 DIAGNOSIS — N289 Disorder of kidney and ureter, unspecified: Secondary | ICD-10-CM

## 2012-01-19 DIAGNOSIS — N189 Chronic kidney disease, unspecified: Secondary | ICD-10-CM

## 2012-01-19 LAB — CBC WITH DIFFERENTIAL (CANCER CENTER ONLY)
Eosinophils Absolute: 0 10*3/uL (ref 0.0–0.5)
MONO#: 0.4 10*3/uL (ref 0.1–0.9)
NEUT#: 1.1 10*3/uL — ABNORMAL LOW (ref 1.5–6.5)
Platelets: 107 10*3/uL — ABNORMAL LOW (ref 145–400)
RBC: 3.53 10*6/uL — ABNORMAL LOW (ref 3.70–5.32)
WBC: 2.1 10*3/uL — ABNORMAL LOW (ref 3.9–10.0)

## 2012-01-19 LAB — COMPREHENSIVE METABOLIC PANEL
Albumin: 3.7 g/dL (ref 3.5–5.2)
CO2: 24 mEq/L (ref 19–32)
Calcium: 7.9 mg/dL — ABNORMAL LOW (ref 8.4–10.5)
Glucose, Bld: 98 mg/dL (ref 70–99)
Potassium: 4.4 mEq/L (ref 3.5–5.3)
Sodium: 139 mEq/L (ref 135–145)
Total Protein: 8.1 g/dL (ref 6.0–8.3)

## 2012-01-19 LAB — RETICULOCYTES (CHCC)
RBC.: 3.62 MIL/uL — ABNORMAL LOW (ref 3.87–5.11)
Retic Ct Pct: 1.1 % (ref 0.4–2.3)

## 2012-01-19 LAB — IRON AND TIBC: UIBC: 51 ug/dL — ABNORMAL LOW (ref 125–400)

## 2012-01-19 MED ORDER — DARBEPOETIN ALFA-POLYSORBATE 300 MCG/0.6ML IJ SOLN
300.0000 ug | Freq: Once | INTRAMUSCULAR | Status: AC
  Administered 2012-01-19: 300 ug via SUBCUTANEOUS

## 2012-01-19 NOTE — Patient Instructions (Signed)
Darbepoetin Alfa injection What is this medicine? DARBEPOETIN ALFA (dar be POE e tin AL fa) helps your body make more red blood cells. It is used to treat anemia caused by chronic kidney failure and chemotherapy. This medicine may be used for other purposes; ask your health care provider or pharmacist if you have questions. What should I tell my health care provider before I take this medicine? They need to know if you have any of these conditions: -blood clotting disorders or history of blood clots -cancer patient not on chemotherapy -cystic fibrosis -heart disease, such as angina, heart failure, or a history of a heart attack -hemoglobin level of 12 g/dL or greater -high blood pressure -low levels of folate, iron, or vitamin B12 -seizures -an unusual or allergic reaction to darbepoetin, erythropoietin, albumin, hamster proteins, latex, other medicines, foods, dyes, or preservatives -pregnant or trying to get pregnant -breast-feeding How should I use this medicine? This medicine is for injection into a vein or under the skin. It is usually given by a health care professional in a hospital or clinic setting. If you get this medicine at home, you will be taught how to prepare and give this medicine. Do not shake the solution before you withdraw a dose. Use exactly as directed. Take your medicine at regular intervals. Do not take your medicine more often than directed. It is important that you put your used needles and syringes in a special sharps container. Do not put them in a trash can. If you do not have a sharps container, call your pharmacist or healthcare provider to get one. Talk to your pediatrician regarding the use of this medicine in children. While this medicine may be used in children as young as 1 year for selected conditions, precautions do apply. Overdosage: If you think you have taken too much of this medicine contact a poison control center or emergency room at once. NOTE:  This medicine is only for you. Do not share this medicine with others. What if I miss a dose? If you miss a dose, take it as soon as you can. If it is almost time for your next dose, take only that dose. Do not take double or extra doses. What may interact with this medicine? Do not take this medicine with any of the following medications: -epoetin alfa This list may not describe all possible interactions. Give your health care provider a list of all the medicines, herbs, non-prescription drugs, or dietary supplements you use. Also tell them if you smoke, drink alcohol, or use illegal drugs. Some items may interact with your medicine. What should I watch for while using this medicine? Visit your prescriber or health care professional for regular checks on your progress and for the needed blood tests and blood pressure measurements. It is especially important for the doctor to make sure your hemoglobin level is in the desired range, to limit the risk of potential side effects and to give you the best benefit. Keep all appointments for any recommended tests. Check your blood pressure as directed. Ask your doctor what your blood pressure should be and when you should contact him or her. As your body makes more red blood cells, you may need to take iron, folic acid, or vitamin B supplements. Ask your doctor or health care provider which products are right for you. If you have kidney disease continue dietary restrictions, even though this medication can make you feel better. Talk with your doctor or health care professional about the   foods you eat and the vitamins that you take. What side effects may I notice from receiving this medicine? Side effects that you should report to your doctor or health care professional as soon as possible: -allergic reactions like skin rash, itching or hives, swelling of the face, lips, or tongue -breathing problems -changes in vision -chest pain -confusion, trouble speaking  or understanding -feeling faint or lightheaded, falls -high blood pressure -muscle aches or pains -pain, swelling, warmth in the leg -rapid weight gain -severe headaches -sudden numbness or weakness of the face, arm or leg -trouble walking, dizziness, loss of balance or coordination -seizures (convulsions) -swelling of the ankles, feet, hands -unusually weak or tired Side effects that usually do not require medical attention (report to your doctor or health care professional if they continue or are bothersome): -diarrhea -fever, chills (flu-like symptoms) -headaches -nausea, vomiting -redness, stinging, or swelling at site where injected This list may not describe all possible side effects. Call your doctor for medical advice about side effects. You may report side effects to FDA at 1-800-FDA-1088. Where should I keep my medicine? Keep out of the reach of children. Store in a refrigerator between 2 and 8 degrees C (36 and 46 degrees F). Do not freeze. Do not shake. Throw away any unused portion if using a single-dose vial. Throw away any unused medicine after the expiration date. NOTE: This sheet is a summary. It may not cover all possible information. If you have questions about this medicine, talk to your doctor, pharmacist, or health care provider.  2012, [employer]. (09/23/2008 10:23:57 AM)

## 2012-02-09 ENCOUNTER — Telehealth: Payer: Self-pay | Admitting: Hematology & Oncology

## 2012-02-09 ENCOUNTER — Other Ambulatory Visit: Payer: Medicaid Other | Admitting: Lab

## 2012-02-09 ENCOUNTER — Ambulatory Visit: Payer: Medicaid Other | Admitting: Hematology & Oncology

## 2012-02-09 ENCOUNTER — Ambulatory Visit: Payer: Medicaid Other

## 2012-02-09 NOTE — Telephone Encounter (Signed)
Pt called and cx 02/09/12 apt due to not feeling  Well.  She resch for 02/23/12, nurse was notified of apt change

## 2012-02-13 ENCOUNTER — Other Ambulatory Visit (HOSPITAL_BASED_OUTPATIENT_CLINIC_OR_DEPARTMENT_OTHER): Payer: Self-pay | Admitting: Nurse Practitioner

## 2012-02-13 DIAGNOSIS — Z1231 Encounter for screening mammogram for malignant neoplasm of breast: Secondary | ICD-10-CM

## 2012-02-16 ENCOUNTER — Ambulatory Visit (HOSPITAL_BASED_OUTPATIENT_CLINIC_OR_DEPARTMENT_OTHER)
Admission: RE | Admit: 2012-02-16 | Discharge: 2012-02-16 | Disposition: A | Discharge status: Home / Self Care | Payer: Medicaid Other | Source: Ambulatory Visit | Attending: Nurse Practitioner | Admitting: Nurse Practitioner

## 2012-02-16 DIAGNOSIS — Z1231 Encounter for screening mammogram for malignant neoplasm of breast: Secondary | ICD-10-CM

## 2012-02-23 ENCOUNTER — Ambulatory Visit: Payer: Medicaid Other

## 2012-02-23 ENCOUNTER — Ambulatory Visit (HOSPITAL_BASED_OUTPATIENT_CLINIC_OR_DEPARTMENT_OTHER): Payer: Medicaid Other | Admitting: Hematology & Oncology

## 2012-02-23 ENCOUNTER — Other Ambulatory Visit (HOSPITAL_BASED_OUTPATIENT_CLINIC_OR_DEPARTMENT_OTHER): Payer: Medicaid Other | Admitting: Lab

## 2012-02-23 VITALS — BP 135/90 | HR 95 | Temp 97.5°F | Ht 62.0 in | Wt 138.0 lb

## 2012-02-23 DIAGNOSIS — D61818 Other pancytopenia: Secondary | ICD-10-CM

## 2012-02-23 DIAGNOSIS — D631 Anemia in chronic kidney disease: Secondary | ICD-10-CM

## 2012-02-23 DIAGNOSIS — D649 Anemia, unspecified: Secondary | ICD-10-CM

## 2012-02-23 DIAGNOSIS — Z944 Liver transplant status: Secondary | ICD-10-CM

## 2012-02-23 LAB — CBC WITH DIFFERENTIAL (CANCER CENTER ONLY)
BASO#: 0 10*3/uL (ref 0.0–0.2)
BASO%: 0 % (ref 0.0–2.0)
EOS%: 1.6 % (ref 0.0–7.0)
HCT: 34 % — ABNORMAL LOW (ref 34.8–46.6)
HGB: 11.8 g/dL (ref 11.6–15.9)
LYMPH#: 0.5 10*3/uL — ABNORMAL LOW (ref 0.9–3.3)
MCH: 30.6 pg (ref 26.0–34.0)
MCHC: 34.7 g/dL (ref 32.0–36.0)
MONO%: 13.1 % — ABNORMAL HIGH (ref 0.0–13.0)
NEUT#: 1.1 10*3/uL — ABNORMAL LOW (ref 1.5–6.5)
NEUT%: 59.6 % (ref 39.6–80.0)
RDW: 12.3 % (ref 11.1–15.7)

## 2012-02-23 LAB — IRON AND TIBC
%SAT: 62 % — ABNORMAL HIGH (ref 20–55)
Iron: 115 ug/dL (ref 42–145)
TIBC: 186 ug/dL — ABNORMAL LOW (ref 250–470)
UIBC: 71 ug/dL — ABNORMAL LOW (ref 125–400)

## 2012-02-23 LAB — COMPREHENSIVE METABOLIC PANEL
ALT: 13 U/L (ref 0–35)
AST: 23 U/L (ref 0–37)
Alkaline Phosphatase: 93 U/L (ref 39–117)
BUN: 26 mg/dL — ABNORMAL HIGH (ref 6–23)
Creatinine, Ser: 1.21 mg/dL — ABNORMAL HIGH (ref 0.50–1.10)

## 2012-02-23 NOTE — Progress Notes (Signed)
This office note has been dictated.

## 2012-02-24 NOTE — Progress Notes (Signed)
CC:   Baltazar Najjar, N.P.  DIAGNOSES: 1. Pancytopenia. 2. Status post liver transplant. 3. Anemia secondary to renal insufficiency.  CURRENT THERAPY:  Aranesp 300 mcg subcu as needed for hemoglobin less than 11.  INTERVAL HISTORY:  Tami [name] comes in for followup.  She is doing okay. She has no specific complaints.  She has had no fever, sweats or chills. She continues on her medications for her transplant.  She is on cyclosporine.  She has had no bleeding or bruising.  There has been no leg swelling. She has had no cough.  There has been no fever, sweats or chills.  PHYSICAL EXAMINATION:  This is a petite black female in no obvious distress.  Vital signs:  Temperature of 97.5, pulse 95, respiratory rate 18, blood pressure 135/90.  Weight is 138.  Head and neck: Normocephalic, atraumatic skull.  There are no ocular or oral lesions. There is no scleral icterus.  There is no adenopathy in her neck. Lungs:  Clear bilaterally.  Cardiac:  Regular rate and rhythm with normal S1, S2.  There are no murmurs, rubs or bruits.  Abdomen:  Soft with good bowel sounds.  There is no palpable abdominal mass.  There is a well-healed laparotomy scars.  There is no palpable hepatosplenomegaly.  Extremities:  No clubbing, cyanosis or edema. Neurologic:  No focal neurological deficits.  LABORATORY STUDIES:  White cell count 1.8, hemoglobin 11.8, hematocrit 34, platelet count 107.  IMPRESSION:  Tami [name] is a 56 year old African American female with a history of liver transplant. She has pancytopenia.  This been chronic. One has to suspect that this is related to her medications.  Thankfully, she has not had any problems with her kidneys being on cyclosporin.  She does not need any Aranesp today.  We have her come back every 3 weeks to have her labs checked.  I will plan see back myself probably in another 3 months.    ______________________________ [name], M.D. PRE/MEDQ   D:  02/23/2012  T:  02/24/2012  Job:  [id number]

## 2012-03-15 ENCOUNTER — Ambulatory Visit (HOSPITAL_BASED_OUTPATIENT_CLINIC_OR_DEPARTMENT_OTHER): Payer: Medicaid Other

## 2012-03-15 ENCOUNTER — Other Ambulatory Visit (HOSPITAL_BASED_OUTPATIENT_CLINIC_OR_DEPARTMENT_OTHER): Payer: Medicaid Other | Admitting: Lab

## 2012-03-15 VITALS — BP 129/85 | HR 86 | Temp 97.5°F

## 2012-03-15 DIAGNOSIS — D631 Anemia in chronic kidney disease: Secondary | ICD-10-CM

## 2012-03-15 DIAGNOSIS — N189 Chronic kidney disease, unspecified: Secondary | ICD-10-CM

## 2012-03-15 DIAGNOSIS — D649 Anemia, unspecified: Secondary | ICD-10-CM

## 2012-03-15 LAB — CBC WITH DIFFERENTIAL (CANCER CENTER ONLY)
BASO%: 0 % (ref 0.0–2.0)
Eosinophils Absolute: 0 10*3/uL (ref 0.0–0.5)
LYMPH%: 28.4 % (ref 14.0–48.0)
MCH: 30.6 pg (ref 26.0–34.0)
MCV: 89 fL (ref 81–101)
MONO#: 0.2 10*3/uL (ref 0.1–0.9)
MONO%: 12.5 % (ref 0.0–13.0)
NEUT#: 1 10*3/uL — ABNORMAL LOW (ref 1.5–6.5)
Platelets: 99 10*3/uL — ABNORMAL LOW (ref 145–400)
RBC: 3.07 10*6/uL — ABNORMAL LOW (ref 3.70–5.32)
RDW: 12.1 % (ref 11.1–15.7)
WBC: 1.8 10*3/uL — ABNORMAL LOW (ref 3.9–10.0)

## 2012-03-15 LAB — IRON AND TIBC
%SAT: 49 % (ref 20–55)
Iron: 81 ug/dL (ref 42–145)

## 2012-03-15 LAB — FERRITIN: Ferritin: 835 ng/mL — ABNORMAL HIGH (ref 10–291)

## 2012-03-15 MED ORDER — DARBEPOETIN ALFA-POLYSORBATE 300 MCG/0.6ML IJ SOLN
300.0000 ug | Freq: Once | INTRAMUSCULAR | Status: AC
  Administered 2012-03-15: 300 ug via SUBCUTANEOUS

## 2012-03-15 NOTE — Patient Instructions (Signed)
Darbepoetin Alfa injection What is this medicine? DARBEPOETIN ALFA (dar be POE e tin AL fa) helps your body make more red blood cells. It is used to treat anemia caused by chronic kidney failure and chemotherapy. This medicine may be used for other purposes; ask your health care provider or pharmacist if you have questions. What should I tell my health care provider before I take this medicine? They need to know if you have any of these conditions: -blood clotting disorders or history of blood clots -cancer patient not on chemotherapy -cystic fibrosis -heart disease, such as angina, heart failure, or a history of a heart attack -hemoglobin level of 12 g/dL or greater -high blood pressure -low levels of folate, iron, or vitamin B12 -seizures -an unusual or allergic reaction to darbepoetin, erythropoietin, albumin, hamster proteins, latex, other medicines, foods, dyes, or preservatives -pregnant or trying to get pregnant -breast-feeding How should I use this medicine? This medicine is for injection into a vein or under the skin. It is usually given by a health care professional in a hospital or clinic setting. If you get this medicine at home, you will be taught how to prepare and give this medicine. Do not shake the solution before you withdraw a dose. Use exactly as directed. Take your medicine at regular intervals. Do not take your medicine more often than directed. It is important that you put your used needles and syringes in a special sharps container. Do not put them in a trash can. If you do not have a sharps container, call your pharmacist or healthcare provider to get one. Talk to your pediatrician regarding the use of this medicine in children. While this medicine may be used in children as young as 1 year for selected conditions, precautions do apply. Overdosage: If you think you have taken too much of this medicine contact a poison control center or emergency room at once. NOTE:  This medicine is only for you. Do not share this medicine with others. What if I miss a dose? If you miss a dose, take it as soon as you can. If it is almost time for your next dose, take only that dose. Do not take double or extra doses. What may interact with this medicine? Do not take this medicine with any of the following medications: -epoetin alfa This list may not describe all possible interactions. Give your health care provider a list of all the medicines, herbs, non-prescription drugs, or dietary supplements you use. Also tell them if you smoke, drink alcohol, or use illegal drugs. Some items may interact with your medicine. What should I watch for while using this medicine? Visit your prescriber or health care professional for regular checks on your progress and for the needed blood tests and blood pressure measurements. It is especially important for the doctor to make sure your hemoglobin level is in the desired range, to limit the risk of potential side effects and to give you the best benefit. Keep all appointments for any recommended tests. Check your blood pressure as directed. Ask your doctor what your blood pressure should be and when you should contact him or her. As your body makes more red blood cells, you may need to take iron, folic acid, or vitamin B supplements. Ask your doctor or health care provider which products are right for you. If you have kidney disease continue dietary restrictions, even though this medication can make you feel better. Talk with your doctor or health care professional about the   foods you eat and the vitamins that you take. What side effects may I notice from receiving this medicine? Side effects that you should report to your doctor or health care professional as soon as possible: -allergic reactions like skin rash, itching or hives, swelling of the face, lips, or tongue -breathing problems -changes in vision -chest pain -confusion, trouble speaking  or understanding -feeling faint or lightheaded, falls -high blood pressure -muscle aches or pains -pain, swelling, warmth in the leg -rapid weight gain -severe headaches -sudden numbness or weakness of the face, arm or leg -trouble walking, dizziness, loss of balance or coordination -seizures (convulsions) -swelling of the ankles, feet, hands -unusually weak or tired Side effects that usually do not require medical attention (report to your doctor or health care professional if they continue or are bothersome): -diarrhea -fever, chills (flu-like symptoms) -headaches -nausea, vomiting -redness, stinging, or swelling at site where injected This list may not describe all possible side effects. Call your doctor for medical advice about side effects. You may report side effects to FDA at 1-800-FDA-1088. Where should I keep my medicine? Keep out of the reach of children. Store in a refrigerator between 2 and 8 degrees C (36 and 46 degrees F). Do not freeze. Do not shake. Throw away any unused portion if using a single-dose vial. Throw away any unused medicine after the expiration date. NOTE: This sheet is a summary. It may not cover all possible information. If you have questions about this medicine, talk to your doctor, pharmacist, or health care provider.  2012, [employer]. (09/23/2008 10:23:57 AM)

## 2012-04-04 ENCOUNTER — Encounter (HOSPITAL_BASED_OUTPATIENT_CLINIC_OR_DEPARTMENT_OTHER): Payer: Self-pay | Admitting: Family Medicine

## 2012-04-04 ENCOUNTER — Emergency Department (HOSPITAL_BASED_OUTPATIENT_CLINIC_OR_DEPARTMENT_OTHER)
Admission: EM | Admit: 2012-04-04 | Discharge: 2012-04-04 | Disposition: A | Discharge status: Home / Self Care | Payer: Medicaid Other | Attending: Emergency Medicine | Admitting: Emergency Medicine

## 2012-04-04 DIAGNOSIS — M545 Low back pain, unspecified: Secondary | ICD-10-CM | POA: Insufficient documentation

## 2012-04-04 DIAGNOSIS — I10 Essential (primary) hypertension: Secondary | ICD-10-CM | POA: Insufficient documentation

## 2012-04-04 DIAGNOSIS — Z79899 Other long term (current) drug therapy: Secondary | ICD-10-CM | POA: Insufficient documentation

## 2012-04-04 MED ORDER — PROMETHAZINE HCL 25 MG PO TABS
12.5000 mg | ORAL_TABLET | Freq: Once | ORAL | Status: AC
  Administered 2012-04-04: 12.5 mg via ORAL
  Filled 2012-04-04: qty 1

## 2012-04-04 MED ORDER — OXYCODONE-ACETAMINOPHEN 5-325 MG PO TABS
1.0000 | ORAL_TABLET | Freq: Once | ORAL | Status: AC
  Administered 2012-04-04: 1 via ORAL
  Filled 2012-04-04: qty 1

## 2012-04-04 MED ORDER — OXYCODONE-ACETAMINOPHEN 5-325 MG PO TABS: 1.0000 | ORAL_TABLET | ORAL | Status: AC | PRN

## 2012-04-04 MED ORDER — PROMETHAZINE HCL 25 MG PO TABS
12.5000 mg | ORAL_TABLET | Freq: Four times a day (QID) | ORAL | Status: DC | PRN
Start: 2012-04-04 — End: 2012-05-03

## 2012-04-04 NOTE — Discharge Instructions (Signed)
Back Pain, Adult Low back pain is very common. About 1 in 5 people have back pain.The cause of low back pain is rarely dangerous. The pain often gets better over time.About half of people with a sudden onset of back pain feel better in just 2 weeks. About 8 in 10 people feel better by 6 weeks.  CAUSES Some common causes of back pain include:  Strain of the muscles or ligaments supporting the spine.   Wear and tear (degeneration) of the spinal discs.   Arthritis.   Direct injury to the back.  DIAGNOSIS Most of the time, the direct cause of low back pain is not known.However, back pain can be treated effectively even when the exact cause of the pain is unknown.Answering your caregiver's questions about your overall health and symptoms is one of the most accurate ways to make sure the cause of your pain is not dangerous. If your caregiver needs more information, he or she may order lab work or imaging tests (X-rays or MRIs).However, even if imaging tests show changes in your back, this usually does not require surgery. HOME CARE INSTRUCTIONS For many people, back pain returns.Since low back pain is rarely dangerous, it is often a condition that people can learn to manageon their own.   Remain active. It is stressful on the back to sit or stand in one place. Do not sit, drive, or stand in one place for more than 30 minutes at a time. Take short walks on level surfaces as soon as pain allows.Try to increase the length of time you walk each day.   Do not stay in bed.Resting more than 1 or 2 days can delay your recovery.   Do not avoid exercise or work.Your body is made to move.It is not dangerous to be active, even though your back may hurt.Your back will likely heal faster if you return to being active before your pain is gone.   Pay attention to your body when you bend and lift. Many people have less discomfortwhen lifting if they bend their knees, keep the load close to their  bodies,and avoid twisting. Often, the most comfortable positions are those that put less stress on your recovering back.   Find a comfortable position to sleep. Use a firm mattress and lie on your side with your knees slightly bent. If you lie on your back, put a pillow under your knees.   Only take over-the-counter or prescription medicines as directed by your caregiver. Over-the-counter medicines to reduce pain and inflammation are often the most helpful.Your caregiver may prescribe muscle relaxant drugs.These medicines help dull your pain so you can more quickly return to your normal activities and healthy exercise.   Put ice on the injured area.   Put ice in a plastic bag.   Place a towel between your skin and the bag.   Leave the ice on for 15 to 20 minutes, 3 to 4 times a day for the first 2 to 3 days. After that, ice and heat may be alternated to reduce pain and spasms.   Ask your caregiver about trying back exercises and gentle massage. This may be of some benefit.   Avoid feeling anxious or stressed.Stress increases muscle tension and can worsen back pain.It is important to recognize when you are anxious or stressed and learn ways to manage it.Exercise is a great option.  SEEK MEDICAL CARE IF:  You have pain that is not relieved with rest or medicine.   You have   pain that does not improve in 1 week.   You have new symptoms.   You are generally not feeling well.  SEEK IMMEDIATE MEDICAL CARE IF:   You have pain that radiates from your back into your legs.   You develop new bowel or bladder control problems.   You have unusual weakness or numbness in your arms or legs.   You develop nausea or vomiting.   You develop abdominal pain.   You feel faint.  Document Released: 10/10/2005 Document Revised: 09/29/2011 Document Reviewed: 02/28/2011 Exit[employer] Patient Information 2012 [employer]. 

## 2012-04-04 NOTE — ED Notes (Signed)
Pt c/o low back pain after almost falling but catching herself on Sunday. Pt sts pain is non-radiating and tylenol at home is not helping. Pt ambulatory to room.

## 2012-04-04 NOTE — ED Provider Notes (Signed)
History     CSN: [id number]  Arrival date & time 04/04/12  [id number]   First MD Initiated Contact with Patient 04/04/12 [phone]      Chief Complaint  Patient presents with  . Back Pain    (Consider location/radiation/quality/duration/timing/severity/associated sxs/prior treatment) Patient is a 56 y.o. female presenting with back pain. The history is provided by the patient and medical records. No language interpreter was used.  Back Pain  This is a new problem. The current episode started more than 2 days ago (Patient had climbed to 3 bladder to hang pictures, missed a step and fell. She caught herself and wrenched her back. This happened on "Sunday, 3 days ago. She's had persistent pain in the lower back. She has tried Tylenol without relief.). The problem occurs constantly. The problem has not changed since onset.Associated with: A fall from a ladder. The pain is present in the lumbar spine. The quality of the pain is described as shooting. The pain does not radiate. The pain is at a severity of 7/10. The pain is moderate. The symptoms are aggravated by bending and twisting. Pertinent negatives include no fever. She has tried analgesics for the symptoms. The treatment provided no relief. Risk factors: Prior liver transplant, on cyclosporine.    Past Medical History  Diagnosis Date  . Hypertension   . Hepatitis, autoimmune 12/08/2011    Past Surgical History  Procedure Date  . Liver transplant     19" 96  . Tonsillectomy   . Abdominal hysterectomy     No family history on file.  History  Substance Use Topics  . Smoking status: Never Smoker   . Smokeless tobacco: Not on file  . Alcohol Use: Yes    OB History    Grav Para Term Preterm Abortions TAB SAB Ect Mult Living                  Review of Systems  Constitutional: Negative.  Negative for fever and chills.  HENT: Negative.   Eyes: Negative.   Respiratory: Negative.   Cardiovascular: Negative.   Gastrointestinal:  Negative.   Genitourinary: Negative.   Musculoskeletal: Positive for back pain.  Neurological: Negative.   Psychiatric/Behavioral: Negative.     Allergies  Review of patient's allergies indicates no known allergies.  Home Medications   Current Outpatient Rx  Name Route Sig Dispense Refill  . TEMAZEPAM 30 MG PO CAPS  Take 1-2, IF NEEDED, at bedtime for sleep. 30 capsule 2  . ALENDRONATE SODIUM 10 MG PO TABS Oral Take 10 mg by mouth once a week. Take with a full glass of water on an empty stomach.    . AMLODIPINE BESYLATE 10 MG PO TABS Oral Take 10 mg by mouth daily.      Marland Kitchen CALCIUM 1200 PO Oral Take by mouth 2 (two) times daily.    . CHOLECALCIFEROL 5000 UNITS PO CAPS Oral Take 5,000 Units by mouth daily.    Marland Kitchen CITALOPRAM HYDROBROMIDE PO Oral Take 10 mg by mouth every morning.    . CYCLOSPORINE MODIFIED (NEORAL) 100 MG/ML PO SOLN Oral Take by mouth 2 (two) times daily.    Marland Kitchen NEORAL PO Oral Take 7.5 mg by mouth 2 (two) times daily.     Marland Kitchen HYDROCHLOROTHIAZIDE PO Oral Take 12.5 mg by mouth daily. Takes 12.5 mg.    . MAGNESIUM OXIDE -MG SUPPLEMENT 400 MG PO CAPS Oral Take by mouth every morning.    . MULTI-VITAMIN/MINERALS PO TABS Oral Take 1 tablet by  mouth daily.      . OXYCODONE HCL 15 MG PO TABS Oral Take 15 mg by mouth every 6 (six) hours as needed.    Marland Kitchen PROMETHAZINE HCL 12.5 MG PO TABS Oral Take 1 tablet (12.5 mg total) by mouth every 6 (six) hours as needed for nausea. 60 tablet 0  . PROMETHAZINE HCL 12.5 MG PO TABS Oral Take 12.5 mg by mouth every 6 (six) hours as needed.    Marland Kitchen UNKNOWN TO PATIENT  every morning. depression      BP 169/98  Pulse 105  Temp 98.7 F (37.1 C) (Oral)  Resp 14  SpO2 97%  Physical Exam  Nursing note and vitals reviewed. Constitutional: She is oriented to person, place, and time.       Hypertensive, mildly tachycardic. She is a tiny woman with some kyphosis. She localizes pain to the upper lumbar region.  HENT:  Head: Normocephalic and atraumatic.    Eyes: Conjunctivae and EOM are normal. Pupils are equal, round, and reactive to light.  Neck: Normal range of motion. Neck supple.  Cardiovascular: Normal rate, regular rhythm and normal heart sounds.   Pulmonary/Chest: Effort normal and breath sounds normal.  Abdominal: Soft. Bowel sounds are normal.  Musculoskeletal:       She localizes pain to the upper lumbar region. There is no palpable deformity or point tenderness.  Neurological: She is alert and oriented to person, place, and time.       No sensory or motor deficit.  Skin: Skin is dry.  Psychiatric: She has a normal mood and affect. Her behavior is normal.    ED Course  Procedures (including critical care time)   9:48 AM Patient was seen and had physical exam. Her exam did not suggest an injury that needed imaging. Prescription for Percocet for pain and Phenergan for nausea.  1. Low back pain         Mylinda Latina III, MD 04/04/12 [phone]

## 2012-04-05 ENCOUNTER — Ambulatory Visit: Payer: Medicaid Other

## 2012-04-05 ENCOUNTER — Other Ambulatory Visit (HOSPITAL_BASED_OUTPATIENT_CLINIC_OR_DEPARTMENT_OTHER): Payer: Medicaid Other | Admitting: Lab

## 2012-04-05 DIAGNOSIS — N189 Chronic kidney disease, unspecified: Secondary | ICD-10-CM

## 2012-04-05 DIAGNOSIS — D61818 Other pancytopenia: Secondary | ICD-10-CM

## 2012-04-05 DIAGNOSIS — Z944 Liver transplant status: Secondary | ICD-10-CM

## 2012-04-05 LAB — CBC WITH DIFFERENTIAL (CANCER CENTER ONLY)
BASO#: 0 10*3/uL (ref 0.0–0.2)
Eosinophils Absolute: 0 10*3/uL (ref 0.0–0.5)
HCT: 36.4 % (ref 34.8–46.6)
HGB: 12.2 g/dL (ref 11.6–15.9)
LYMPH#: 0.7 10*3/uL — ABNORMAL LOW (ref 0.9–3.3)
MCH: 30.1 pg (ref 26.0–34.0)
MCHC: 33.5 g/dL (ref 32.0–36.0)
MONO%: 14 % — ABNORMAL HIGH (ref 0.0–13.0)
NEUT#: 1 10*3/uL — ABNORMAL LOW (ref 1.5–6.5)
NEUT%: 49.8 % (ref 39.6–80.0)
RBC: 4.05 10*6/uL (ref 3.70–5.32)

## 2012-04-05 LAB — IRON AND TIBC
%SAT: 60 % — ABNORMAL HIGH (ref 20–55)
Iron: 128 ug/dL (ref 42–145)
UIBC: 86 ug/dL — ABNORMAL LOW (ref 125–400)

## 2012-04-25 ENCOUNTER — Ambulatory Visit: Payer: Medicaid Other | Admitting: Medical

## 2012-04-25 ENCOUNTER — Ambulatory Visit: Payer: Medicaid Other

## 2012-04-25 ENCOUNTER — Other Ambulatory Visit: Payer: Medicaid Other | Admitting: Lab

## 2012-04-27 ENCOUNTER — Ambulatory Visit: Payer: Medicaid Other | Admitting: Medical

## 2012-04-27 ENCOUNTER — Ambulatory Visit: Payer: Medicaid Other

## 2012-04-27 ENCOUNTER — Other Ambulatory Visit: Payer: Medicaid Other | Admitting: Lab

## 2012-04-27 ENCOUNTER — Ambulatory Visit: Payer: Medicaid Other | Admitting: Hematology & Oncology

## 2012-05-03 ENCOUNTER — Ambulatory Visit (HOSPITAL_BASED_OUTPATIENT_CLINIC_OR_DEPARTMENT_OTHER): Payer: Medicaid Other | Admitting: Medical

## 2012-05-03 ENCOUNTER — Ambulatory Visit (HOSPITAL_BASED_OUTPATIENT_CLINIC_OR_DEPARTMENT_OTHER): Payer: Medicaid Other

## 2012-05-03 ENCOUNTER — Other Ambulatory Visit (HOSPITAL_BASED_OUTPATIENT_CLINIC_OR_DEPARTMENT_OTHER): Payer: Medicaid Other | Admitting: Lab

## 2012-05-03 VITALS — BP 118/79 | HR 89 | Temp 97.4°F | Ht 62.0 in | Wt 134.0 lb

## 2012-05-03 DIAGNOSIS — D631 Anemia in chronic kidney disease: Secondary | ICD-10-CM

## 2012-05-03 DIAGNOSIS — Z944 Liver transplant status: Secondary | ICD-10-CM

## 2012-05-03 DIAGNOSIS — D649 Anemia, unspecified: Secondary | ICD-10-CM

## 2012-05-03 DIAGNOSIS — G4701 Insomnia due to medical condition: Secondary | ICD-10-CM

## 2012-05-03 DIAGNOSIS — N289 Disorder of kidney and ureter, unspecified: Secondary | ICD-10-CM

## 2012-05-03 DIAGNOSIS — D61818 Other pancytopenia: Secondary | ICD-10-CM

## 2012-05-03 LAB — CBC WITH DIFFERENTIAL (CANCER CENTER ONLY)
BASO%: 0 % (ref 0.0–2.0)
Eosinophils Absolute: 0 10*3/uL (ref 0.0–0.5)
LYMPH#: 0.5 10*3/uL — ABNORMAL LOW (ref 0.9–3.3)
MONO#: 0.2 10*3/uL (ref 0.1–0.9)
Platelets: 119 10*3/uL — ABNORMAL LOW (ref 145–400)
RBC: 3.64 10*6/uL — ABNORMAL LOW (ref 3.70–5.32)
RDW: 12.2 % (ref 11.1–15.7)
WBC: 1.5 10*3/uL — ABNORMAL LOW (ref 3.9–10.0)

## 2012-05-03 MED ORDER — DARBEPOETIN ALFA-POLYSORBATE 300 MCG/0.6ML IJ SOLN
300.0000 ug | Freq: Once | INTRAMUSCULAR | Status: AC
  Administered 2012-05-03: 300 ug via SUBCUTANEOUS

## 2012-05-03 MED ORDER — TEMAZEPAM 30 MG PO CAPS: ORAL_CAPSULE | ORAL | Status: DC

## 2012-05-03 NOTE — Patient Instructions (Signed)
Darbepoetin Alfa injection What is this medicine? DARBEPOETIN ALFA (dar be POE e tin AL fa) helps your body make more red blood cells. It is used to treat anemia caused by chronic kidney failure and chemotherapy. This medicine may be used for other purposes; ask your health care provider or pharmacist if you have questions. What should I tell my health care provider before I take this medicine? They need to know if you have any of these conditions: -blood clotting disorders or history of blood clots -cancer patient not on chemotherapy -cystic fibrosis -heart disease, such as angina, heart failure, or a history of a heart attack -hemoglobin level of 12 g/dL or greater -high blood pressure -low levels of folate, iron, or vitamin B12 -seizures -an unusual or allergic reaction to darbepoetin, erythropoietin, albumin, hamster proteins, latex, other medicines, foods, dyes, or preservatives -pregnant or trying to get pregnant -breast-feeding How should I use this medicine? This medicine is for injection into a vein or under the skin. It is usually given by a health care professional in a hospital or clinic setting. If you get this medicine at home, you will be taught how to prepare and give this medicine. Do not shake the solution before you withdraw a dose. Use exactly as directed. Take your medicine at regular intervals. Do not take your medicine more often than directed. It is important that you put your used needles and syringes in a special sharps container. Do not put them in a trash can. If you do not have a sharps container, call your pharmacist or healthcare provider to get one. Talk to your pediatrician regarding the use of this medicine in children. While this medicine may be used in children as young as 1 year for selected conditions, precautions do apply. Overdosage: If you think you have taken too much of this medicine contact a poison control center or emergency room at once. NOTE:  This medicine is only for you. Do not share this medicine with others. What if I miss a dose? If you miss a dose, take it as soon as you can. If it is almost time for your next dose, take only that dose. Do not take double or extra doses. What may interact with this medicine? Do not take this medicine with any of the following medications: -epoetin alfa This list may not describe all possible interactions. Give your health care provider a list of all the medicines, herbs, non-prescription drugs, or dietary supplements you use. Also tell them if you smoke, drink alcohol, or use illegal drugs. Some items may interact with your medicine. What should I watch for while using this medicine? Visit your prescriber or health care professional for regular checks on your progress and for the needed blood tests and blood pressure measurements. It is especially important for the doctor to make sure your hemoglobin level is in the desired range, to limit the risk of potential side effects and to give you the best benefit. Keep all appointments for any recommended tests. Check your blood pressure as directed. Ask your doctor what your blood pressure should be and when you should contact him or her. As your body makes more red blood cells, you may need to take iron, folic acid, or vitamin B supplements. Ask your doctor or health care provider which products are right for you. If you have kidney disease continue dietary restrictions, even though this medication can make you feel better. Talk with your doctor or health care professional about the   foods you eat and the vitamins that you take. What side effects may I notice from receiving this medicine? Side effects that you should report to your doctor or health care professional as soon as possible: -allergic reactions like skin rash, itching or hives, swelling of the face, lips, or tongue -breathing problems -changes in vision -chest pain -confusion, trouble speaking  or understanding -feeling faint or lightheaded, falls -high blood pressure -muscle aches or pains -pain, swelling, warmth in the leg -rapid weight gain -severe headaches -sudden numbness or weakness of the face, arm or leg -trouble walking, dizziness, loss of balance or coordination -seizures (convulsions) -swelling of the ankles, feet, hands -unusually weak or tired Side effects that usually do not require medical attention (report to your doctor or health care professional if they continue or are bothersome): -diarrhea -fever, chills (flu-like symptoms) -headaches -nausea, vomiting -redness, stinging, or swelling at site where injected This list may not describe all possible side effects. Call your doctor for medical advice about side effects. You may report side effects to FDA at 1-800-FDA-1088. Where should I keep my medicine? Keep out of the reach of children. Store in a refrigerator between 2 and 8 degrees C (36 and 46 degrees F). Do not freeze. Do not shake. Throw away any unused portion if using a single-dose vial. Throw away any unused medicine after the expiration date. NOTE: This sheet is a summary. It may not cover all possible information. If you have questions about this medicine, talk to your doctor, pharmacist, or health care provider.  2012, [employer]. (09/23/2008 10:23:57 AM)

## 2012-05-03 NOTE — Progress Notes (Signed)
Patient Name :Tami [name], Tami [name] MR [id number] DOB: 01-Jul-1956 Encounter Date: 05/03/2012 Dictated by Helayne Seminole, PA-C   DIAGNOSES: 1. Pancytopenia. 2. Status post liver transplant. 3. Anemia secondary to renal insufficiency.  CURRENT THERAPY:  Aranesp 300 mcg subcu as needed for hemoglobin less than 11.  INTERVAL HISTORY:  Tami [name] comes in for followup.  She reports that she recently went to the emergency room for back pain.  Apparently at that time.  She was given Percocet and Phenergan for nausea.  Other than her back issue.  She reports, that she's been doing okay.  She does not have any specific complaints.  She has not had any fevers, chills, or night sweats.  She does continue on her medication for her transplant.  She is on cyclosporin.  She does not report any bleeding or bruising.  She has noticed leg swelling.  She does not report any chest pain, shortness of breath, or cough.  She reports she is eating and drinking quite well.  She is not having any obvious bleeding.  Her hemoglobin is 10.9.  As such, she will receive an Aranesp injection today     Physical Exam: This is a pleasant, 56 year old, African American female, in no obvious distress Vitals: Temperature 97.4 degrees.  Pulse 87, respirations 20, blood pressure is 118/79, weight 134 pounds HEENT reveals a normocephalic, atraumatic skull, no scleral icterus, no oral lesions  Neck is supple without any cervical or supraclavicular adenopathy.  Lungs are clear to auscultation bilaterally.  Cardiac is regular rate and rhythm with a normal S1 and S2. There are no murmurs, rubs, or bruits.  Abdomen is soft with good bowel sounds there's a palpable mass. There is no palpable hepatosplenomegaly. There is no palpable fluid wave.  Musculoskeletal no tenderness of the spine, ribs, or hips.  Extremities there are no clubbing, cyanosis, or edema.  Skin no petechia, purpura or ecchymosis Neurologic is nonfocal.    LABORATORY STUDIES:  White count 1.5, hemoglobin 10.9, hematocrit 32.1, platelets 119,000    Current Outpatient Prescriptions on File Prior to Visit  Medication Sig Dispense Refill  . alendronate (FOSAMAX) 10 MG tablet Take 10 mg by mouth once a week. Take with a full glass of water on an empty stomach.      Marland Kitchen amLODipine (NORVASC) 10 MG tablet Take 10 mg by mouth daily.        . Calcium Carbonate-Vit D-Min (CALCIUM 1200 PO) Take by mouth 2 (two) times daily.      . Cholecalciferol ([employer]S VIT D 5000 HIGH-POTENCY) 5000 UNITS capsule Take 5,000 Units by mouth daily.      Marland Kitchen CITALOPRAM HYDROBROMIDE PO Take 10 mg by mouth every morning.      . cycloSPORINE (NEORAL) 100 MG/ML microemulsion solution Take by mouth 2 (two) times daily.      . CycloSPORINE Modified (NEORAL PO) Take 7.5 mg by mouth 2 (two) times daily.       Marland Kitchen HYDROCHLOROTHIAZIDE PO Take 12.5 mg by mouth daily. Takes 12.5 mg.      . Magnesium Oxide -Mg Supplement 400 MG CAPS Take by mouth every morning.      . Multiple Vitamins-Minerals (MULTIVITAMIN WITH MINERALS) tablet Take 1 tablet by mouth daily.        Marland Kitchen oxyCODONE (ROXICODONE) 15 MG immediate release tablet Take 15 mg by mouth every 6 (six) hours as needed.      . promethazine (PHENERGAN) 12.5 MG tablet Take 1 tablet (12.5 mg total) by mouth every 6 (  six) hours as needed for nausea.  60 tablet  0  . promethazine (PHENERGAN) 12.5 MG tablet Take 12.5 mg by mouth every 6 (six) hours as needed.      . promethazine (PHENERGAN) 25 MG tablet Take 0.5 tablets (12.5 mg total) by mouth every 6 (six) hours as needed for nausea.  20 tablet  0  . temazepam (RESTORIL) 30 MG capsule Take 1-2, IF NEEDED, at bedtime for sleep.  30 capsule  2  . UNKNOWN TO PATIENT every morning. depression        Impression/Plan: This is a pleasant, 56 year old, African American female , with the following issues. #1 anemia secondary to renal insufficiency-Tami [name] will go ahead and receive an Aranesp injection today as her hemoglobin is  below 11. #2 history of pancytopenia. #3 history of liver transplant-she will remain on cyclosporine. #4.Follow-up-Tami [name] will continue to have her blood checked every 3 weeks.  An Aranesp injection 300 mcg subcutaneous for hemoglobin below 11.  We will see her back in 2-3 months, but before then should there be questions or concerns.

## 2012-05-24 ENCOUNTER — Ambulatory Visit: Payer: Medicaid Other

## 2012-05-24 ENCOUNTER — Other Ambulatory Visit: Payer: Medicaid Other | Admitting: Lab

## 2012-06-14 ENCOUNTER — Ambulatory Visit (HOSPITAL_BASED_OUTPATIENT_CLINIC_OR_DEPARTMENT_OTHER): Payer: Medicaid Other

## 2012-06-14 ENCOUNTER — Other Ambulatory Visit (HOSPITAL_BASED_OUTPATIENT_CLINIC_OR_DEPARTMENT_OTHER): Payer: Medicaid Other | Admitting: Lab

## 2012-06-14 VITALS — BP 139/87 | HR 89 | Temp 97.4°F | Resp 18

## 2012-06-14 DIAGNOSIS — D631 Anemia in chronic kidney disease: Secondary | ICD-10-CM

## 2012-06-14 DIAGNOSIS — D649 Anemia, unspecified: Secondary | ICD-10-CM

## 2012-06-14 DIAGNOSIS — Z944 Liver transplant status: Secondary | ICD-10-CM

## 2012-06-14 LAB — CBC WITH DIFFERENTIAL (CANCER CENTER ONLY)
BASO#: 0 10*3/uL (ref 0.0–0.2)
BASO%: 0 % (ref 0.0–2.0)
Eosinophils Absolute: 0 10*3/uL (ref 0.0–0.5)
HCT: 31.4 % — ABNORMAL LOW (ref 34.8–46.6)
HGB: 10.8 g/dL — ABNORMAL LOW (ref 11.6–15.9)
LYMPH#: 0.6 10*3/uL — ABNORMAL LOW (ref 0.9–3.3)
MONO#: 0.2 10*3/uL (ref 0.1–0.9)
NEUT%: 57.7 % (ref 39.6–80.0)
RBC: 3.63 10*6/uL — ABNORMAL LOW (ref 3.70–5.32)
WBC: 2.1 10*3/uL — ABNORMAL LOW (ref 3.9–10.0)

## 2012-06-14 MED ORDER — DARBEPOETIN ALFA-POLYSORBATE 300 MCG/0.6ML IJ SOLN
300.0000 ug | Freq: Once | INTRAMUSCULAR | Status: AC
  Administered 2012-06-14: 300 ug via SUBCUTANEOUS

## 2012-06-14 NOTE — Patient Instructions (Signed)
Darbepoetin Alfa injection What is this medicine? DARBEPOETIN ALFA (dar be POE e tin AL fa) helps your body make more red blood cells. It is used to treat anemia caused by chronic kidney failure and chemotherapy. This medicine may be used for other purposes; ask your health care provider or pharmacist if you have questions. What should I tell my health care provider before I take this medicine? They need to know if you have any of these conditions: -blood clotting disorders or history of blood clots -cancer patient not on chemotherapy -cystic fibrosis -heart disease, such as angina, heart failure, or a history of a heart attack -hemoglobin level of 12 g/dL or greater -high blood pressure -low levels of folate, iron, or vitamin B12 -seizures -an unusual or allergic reaction to darbepoetin, erythropoietin, albumin, hamster proteins, latex, other medicines, foods, dyes, or preservatives -pregnant or trying to get pregnant -breast-feeding How should I use this medicine? This medicine is for injection into a vein or under the skin. It is usually given by a health care professional in a hospital or clinic setting. If you get this medicine at home, you will be taught how to prepare and give this medicine. Do not shake the solution before you withdraw a dose. Use exactly as directed. Take your medicine at regular intervals. Do not take your medicine more often than directed. It is important that you put your used needles and syringes in a special sharps container. Do not put them in a trash can. If you do not have a sharps container, call your pharmacist or healthcare provider to get one. Talk to your pediatrician regarding the use of this medicine in children. While this medicine may be used in children as young as 1 year for selected conditions, precautions do apply. Overdosage: If you think you have taken too much of this medicine contact a poison control center or emergency room at once. NOTE:  This medicine is only for you. Do not share this medicine with others. What if I miss a dose? If you miss a dose, take it as soon as you can. If it is almost time for your next dose, take only that dose. Do not take double or extra doses. What may interact with this medicine? Do not take this medicine with any of the following medications: -epoetin alfa This list may not describe all possible interactions. Give your health care provider a list of all the medicines, herbs, non-prescription drugs, or dietary supplements you use. Also tell them if you smoke, drink alcohol, or use illegal drugs. Some items may interact with your medicine. What should I watch for while using this medicine? Visit your prescriber or health care professional for regular checks on your progress and for the needed blood tests and blood pressure measurements. It is especially important for the doctor to make sure your hemoglobin level is in the desired range, to limit the risk of potential side effects and to give you the best benefit. Keep all appointments for any recommended tests. Check your blood pressure as directed. Ask your doctor what your blood pressure should be and when you should contact him or her. As your body makes more red blood cells, you may need to take iron, folic acid, or vitamin B supplements. Ask your doctor or health care provider which products are right for you. If you have kidney disease continue dietary restrictions, even though this medication can make you feel better. Talk with your doctor or health care professional about the   foods you eat and the vitamins that you take. What side effects may I notice from receiving this medicine? Side effects that you should report to your doctor or health care professional as soon as possible: -allergic reactions like skin rash, itching or hives, swelling of the face, lips, or tongue -breathing problems -changes in vision -chest pain -confusion, trouble speaking  or understanding -feeling faint or lightheaded, falls -high blood pressure -muscle aches or pains -pain, swelling, warmth in the leg -rapid weight gain -severe headaches -sudden numbness or weakness of the face, arm or leg -trouble walking, dizziness, loss of balance or coordination -seizures (convulsions) -swelling of the ankles, feet, hands -unusually weak or tired Side effects that usually do not require medical attention (report to your doctor or health care professional if they continue or are bothersome): -diarrhea -fever, chills (flu-like symptoms) -headaches -nausea, vomiting -redness, stinging, or swelling at site where injected This list may not describe all possible side effects. Call your doctor for medical advice about side effects. You may report side effects to FDA at 1-800-FDA-1088. Where should I keep my medicine? Keep out of the reach of children. Store in a refrigerator between 2 and 8 degrees C (36 and 46 degrees F). Do not freeze. Do not shake. Throw away any unused portion if using a single-dose vial. Throw away any unused medicine after the expiration date. NOTE: This sheet is a summary. It may not cover all possible information. If you have questions about this medicine, talk to your doctor, pharmacist, or health care provider.  2012, [employer]. (09/23/2008 10:23:57 AM)

## 2012-07-05 ENCOUNTER — Other Ambulatory Visit: Payer: Medicaid Other | Admitting: Lab

## 2012-07-05 ENCOUNTER — Ambulatory Visit: Payer: Medicaid Other

## 2012-07-05 ENCOUNTER — Ambulatory Visit: Payer: Medicaid Other | Admitting: Hematology & Oncology

## 2012-07-06 ENCOUNTER — Ambulatory Visit: Payer: Medicaid Other

## 2012-07-06 ENCOUNTER — Other Ambulatory Visit (HOSPITAL_BASED_OUTPATIENT_CLINIC_OR_DEPARTMENT_OTHER): Payer: Medicaid Other | Admitting: Lab

## 2012-07-06 DIAGNOSIS — N189 Chronic kidney disease, unspecified: Secondary | ICD-10-CM

## 2012-07-06 DIAGNOSIS — D649 Anemia, unspecified: Secondary | ICD-10-CM

## 2012-07-06 DIAGNOSIS — Z944 Liver transplant status: Secondary | ICD-10-CM

## 2012-07-06 LAB — CBC WITH DIFFERENTIAL (CANCER CENTER ONLY)
BASO#: 0 10*3/uL (ref 0.0–0.2)
EOS%: 1.5 % (ref 0.0–7.0)
HCT: 35.3 % (ref 34.8–46.6)
HGB: 11.8 g/dL (ref 11.6–15.9)
LYMPH#: 0.6 10*3/uL — ABNORMAL LOW (ref 0.9–3.3)
MCH: 29.5 pg (ref 26.0–34.0)
MCHC: 33.4 g/dL (ref 32.0–36.0)
MCV: 88 fL (ref 81–101)
NEUT%: 55.2 % (ref 39.6–80.0)

## 2012-07-26 ENCOUNTER — Ambulatory Visit: Payer: Medicaid Other

## 2012-07-26 ENCOUNTER — Other Ambulatory Visit: Payer: Medicaid Other | Admitting: Lab

## 2012-07-26 ENCOUNTER — Ambulatory Visit (HOSPITAL_BASED_OUTPATIENT_CLINIC_OR_DEPARTMENT_OTHER): Payer: Medicaid Other | Admitting: Hematology & Oncology

## 2012-07-26 ENCOUNTER — Other Ambulatory Visit (HOSPITAL_BASED_OUTPATIENT_CLINIC_OR_DEPARTMENT_OTHER): Payer: Medicaid Other | Admitting: Lab

## 2012-07-26 VITALS — BP 137/79 | HR 72 | Temp 97.9°F | Resp 18 | Ht 62.0 in | Wt 133.0 lb

## 2012-07-26 DIAGNOSIS — D61818 Other pancytopenia: Secondary | ICD-10-CM

## 2012-07-26 DIAGNOSIS — K754 Autoimmune hepatitis: Secondary | ICD-10-CM

## 2012-07-26 DIAGNOSIS — Z944 Liver transplant status: Secondary | ICD-10-CM

## 2012-07-26 DIAGNOSIS — G4701 Insomnia due to medical condition: Secondary | ICD-10-CM

## 2012-07-26 DIAGNOSIS — D631 Anemia in chronic kidney disease: Secondary | ICD-10-CM

## 2012-07-26 DIAGNOSIS — N189 Chronic kidney disease, unspecified: Secondary | ICD-10-CM

## 2012-07-26 DIAGNOSIS — D649 Anemia, unspecified: Secondary | ICD-10-CM

## 2012-07-26 DIAGNOSIS — G4700 Insomnia, unspecified: Secondary | ICD-10-CM

## 2012-07-26 LAB — CBC WITH DIFFERENTIAL (CANCER CENTER ONLY)
BASO#: 0 10*3/uL (ref 0.0–0.2)
EOS%: 2.3 % (ref 0.0–7.0)
HGB: 11 g/dL — ABNORMAL LOW (ref 11.6–15.9)
MCH: 29.8 pg (ref 26.0–34.0)
MCHC: 34 g/dL (ref 32.0–36.0)
MONO%: 12.8 % (ref 0.0–13.0)
NEUT#: 0.9 10*3/uL — ABNORMAL LOW (ref 1.5–6.5)
NEUT%: 52.9 % (ref 39.6–80.0)

## 2012-07-26 MED ORDER — DOXEPIN HCL 6 MG PO TABS: 6.0000 mg | ORAL_TABLET | Freq: Every evening | ORAL | Status: DC | PRN

## 2012-07-26 MED ORDER — PROMETHAZINE HCL 12.5 MG PO TABS: 12.5000 mg | ORAL_TABLET | Freq: Four times a day (QID) | ORAL | Status: DC | PRN

## 2012-07-26 NOTE — Progress Notes (Signed)
This office note has been dictated.

## 2012-07-27 NOTE — Progress Notes (Signed)
DIAGNOSES: 1. Pancytopenia. 2. Status post liver transplant for autoimmune hepatitis. 3. Chronic renal insufficiency with anemia.  CURRENT THERAPY:  Aranesp 200 mcg subcu as needed for hemoglobin less than 11.  INTERIM HISTORY:  Tami [name] comes in for followup.  She is doing okay. She is not sleeping well.  She is on Restoril.  This is not helping her. I will try her on some Silenor (6 mg p.o. at bedtime p.r.n.) to if this helps.  She has had no fever.  She has had no cough.  Her appetite has been doing fairly well.  She has not noticed any swelling in her legs.  PHYSICAL EXAMINATION:  This is a well-developed, well-nourished black female in no obvious distress.  Vital signs:  97.9, pulse 72, respiratory rate 18, blood pressure 137/79.  Weight is 133.  Head and neck:  Normocephalic, atraumatic skull.  There are no ocular or oral lesions.  There are no palpable cervical or supraclavicular lymph nodes. Lungs:  Clear bilaterally.  Cardiac:  Regular rate and rhythm with a normal S1 and S2.  There are no murmurs or rubs, or bruits.  Abdomen: Soft with good bowel sounds.  She has well-healed laparotomy scars. There is no fluid wave.  There is no guarding or rebound tenderness. There is no palpable hepatosplenomegaly.  Back:  Shows kyphosis.  She has no tenderness over the spine.  Extremities:  No clubbing, cyanosis or edema.  LABORATORY DATA:  White cell count 1.7, hemoglobin 11, hematocrit 32.4, platelet count 111.  MCV is 88.  IMPRESSION:  Tami [name] is a 56 year old African American female with history of liver transplant.  She had autoimmune hepatitis.  She is doing well with that.  She has chronic pancytopenia.  This probably is reflective of the transplant and also some of her medications.  She does not need any Aranesp today.  We did go ahead and refill her Phenergan.  Again, I gave her something to help her sleep.  I told her that she really needs to see her family doctor if  she continues to have issues with insomnia.  We will plan to get her back to see Tami [name] in another 6 weeks or so.    ______________________________ [name], M.D. PRE/MEDQ  D:  07/26/2012  T:  07/27/2012  Job:  [id number]

## 2012-08-16 ENCOUNTER — Other Ambulatory Visit (HOSPITAL_BASED_OUTPATIENT_CLINIC_OR_DEPARTMENT_OTHER): Payer: Medicaid Other | Admitting: Lab

## 2012-08-16 ENCOUNTER — Ambulatory Visit (HOSPITAL_BASED_OUTPATIENT_CLINIC_OR_DEPARTMENT_OTHER): Payer: Medicaid Other

## 2012-08-16 ENCOUNTER — Other Ambulatory Visit: Payer: Medicaid Other | Admitting: Lab

## 2012-08-16 ENCOUNTER — Other Ambulatory Visit: Payer: Self-pay | Admitting: *Deleted

## 2012-08-16 ENCOUNTER — Ambulatory Visit: Payer: Medicaid Other

## 2012-08-16 VITALS — BP 132/87 | HR 77 | Temp 99.0°F | Resp 18

## 2012-08-16 DIAGNOSIS — N189 Chronic kidney disease, unspecified: Secondary | ICD-10-CM

## 2012-08-16 DIAGNOSIS — D649 Anemia, unspecified: Secondary | ICD-10-CM

## 2012-08-16 DIAGNOSIS — G4700 Insomnia, unspecified: Secondary | ICD-10-CM

## 2012-08-16 DIAGNOSIS — G4701 Insomnia due to medical condition: Secondary | ICD-10-CM

## 2012-08-16 DIAGNOSIS — K754 Autoimmune hepatitis: Secondary | ICD-10-CM

## 2012-08-16 DIAGNOSIS — D631 Anemia in chronic kidney disease: Secondary | ICD-10-CM

## 2012-08-16 LAB — CBC WITH DIFFERENTIAL (CANCER CENTER ONLY)
EOS%: 2.9 % (ref 0.0–7.0)
MCH: 30 pg (ref 26.0–34.0)
MCHC: 33.9 g/dL (ref 32.0–36.0)
MONO%: 12 % (ref 0.0–13.0)
NEUT#: 1.1 10*3/uL — ABNORMAL LOW (ref 1.5–6.5)
Platelets: 101 10*3/uL — ABNORMAL LOW (ref 145–400)
RDW: 12.7 % (ref 11.1–15.7)

## 2012-08-16 LAB — COMPREHENSIVE METABOLIC PANEL
CO2: 23 mEq/L (ref 19–32)
Creatinine, Ser: 1.46 mg/dL — ABNORMAL HIGH (ref 0.50–1.10)
Glucose, Bld: 80 mg/dL (ref 70–99)
Total Bilirubin: 0.5 mg/dL (ref 0.3–1.2)
Total Protein: 8 g/dL (ref 6.0–8.3)

## 2012-08-16 LAB — RETICULOCYTES (CHCC)
RBC.: 3.26 MIL/uL — ABNORMAL LOW (ref 3.87–5.11)
Retic Ct Pct: 1.2 % (ref 0.4–2.3)

## 2012-08-16 LAB — IRON AND TIBC
Iron: 113 ug/dL (ref 42–145)
UIBC: 70 ug/dL — ABNORMAL LOW (ref 125–400)

## 2012-08-16 MED ORDER — DARBEPOETIN ALFA-POLYSORBATE 300 MCG/0.6ML IJ SOLN
300.0000 ug | Freq: Once | INTRAMUSCULAR | Status: AC
  Administered 2012-08-16: 300 ug via SUBCUTANEOUS

## 2012-08-16 MED ORDER — TEMAZEPAM 30 MG PO CAPS: 30.0000 mg | ORAL_CAPSULE | Freq: Every evening | ORAL | Status: DC | PRN

## 2012-09-06 ENCOUNTER — Other Ambulatory Visit (HOSPITAL_BASED_OUTPATIENT_CLINIC_OR_DEPARTMENT_OTHER): Payer: Medicaid Other | Admitting: Lab

## 2012-09-06 ENCOUNTER — Ambulatory Visit: Payer: Medicaid Other

## 2012-09-06 ENCOUNTER — Ambulatory Visit (HOSPITAL_BASED_OUTPATIENT_CLINIC_OR_DEPARTMENT_OTHER): Payer: Medicaid Other | Admitting: Medical

## 2012-09-06 VITALS — BP 125/65 | HR 87 | Temp 98.4°F | Resp 16 | Ht 62.0 in | Wt 139.0 lb

## 2012-09-06 DIAGNOSIS — D649 Anemia, unspecified: Secondary | ICD-10-CM

## 2012-09-06 DIAGNOSIS — D631 Anemia in chronic kidney disease: Secondary | ICD-10-CM

## 2012-09-06 DIAGNOSIS — N189 Chronic kidney disease, unspecified: Secondary | ICD-10-CM

## 2012-09-06 DIAGNOSIS — D61818 Other pancytopenia: Secondary | ICD-10-CM

## 2012-09-06 DIAGNOSIS — K754 Autoimmune hepatitis: Secondary | ICD-10-CM

## 2012-09-06 DIAGNOSIS — Z944 Liver transplant status: Secondary | ICD-10-CM

## 2012-09-06 LAB — CBC WITH DIFFERENTIAL (CANCER CENTER ONLY)
BASO%: 0 % (ref 0.0–2.0)
Eosinophils Absolute: 0.1 10*3/uL (ref 0.0–0.5)
MCH: 30 pg (ref 26.0–34.0)
MONO%: 11.6 % (ref 0.0–13.0)
NEUT#: 1.6 10*3/uL (ref 1.5–6.5)
Platelets: 111 10*3/uL — ABNORMAL LOW (ref 145–400)
RBC: 3.77 10*6/uL (ref 3.70–5.32)
RDW: 13.6 % (ref 11.1–15.7)
WBC: 2.5 10*3/uL — ABNORMAL LOW (ref 3.9–10.0)

## 2012-09-06 NOTE — Progress Notes (Signed)
Hgb 11.3 no aranesp today per tracy scott PA

## 2012-09-06 NOTE — Progress Notes (Signed)
Diagnoses: #1 pancytopenia. #2 status post liver transplant for autoimmune hepatitis.  #3 chronic renal insufficiency with anemia.  Current therapy: #1 Aranesp 200 mcg subcutaneous as needed.  For hemoglobin less than 11.  Interim history: Tami [name] presents today for an office followup visit.  Overall, she, reports, that she's doing quite well.  Her last Aranesp injection was about 3 weeks ago.  Her hemoglobin looks good today.  She will not need an Aranesp injection.  She's not reported any recent infections.  She does have some pancytopenia, most likely related to her autoimmune hepatitis, for which she had a liver transplant for her.  Her pancytopenia can also be related to her medications.  She reports, she has a good appetite.  She denies any nausea, vomiting, diarrhea, constipation, any chest pain, shortness of breath, or cough.  She denies any fevers, chills, or night sweats.  She denies any headaches, visual changes, or rashes.  She denies any leg swelling.  She denies any obvious, bleeding.  Review of Systems: Constitutional:Negative for malaise/fatigue, fever, chills, weight loss, diaphoresis, activity change, appetite change, and unexpected weight change.  HEENT: Negative for double vision, blurred vision, visual loss, ear pain, tinnitus, congestion, rhinorrhea, epistaxis sore throat or sinus disease, oral pain/lesion, tongue soreness Respiratory: Negative for cough, chest tightness, shortness of breath, wheezing and stridor.  Cardiovascular: Negative for chest pain, palpitations, leg swelling, orthopnea, PND, DOE or claudication Gastrointestinal: Negative for nausea, vomiting, abdominal pain, diarrhea, constipation, blood in stool, melena, hematochezia, abdominal distention, anal bleeding, rectal pain, anorexia and hematemesis.  Genitourinary: Negative for dysuria, frequency, hematuria,  Musculoskeletal: Negative for myalgias, back pain, joint swelling, arthralgias and gait problem.    Skin: Negative for rash, color change, pallor and wound.  Neurological:. Negative for dizziness/light-headedness, tremors, seizures, syncope, facial asymmetry, speech difficulty, weakness, numbness, headaches and paresthesias.  Hematological: Negative for adenopathy. Does not bruise/bleed easily.  Psychiatric/Behavioral:  Negative for depression, no loss of interest in normal activity or change in sleep pattern.   Physical Exam: This is a pleasant, well-developed, well-nourished, 56 year old, African American, female, in no obvious distress Vitals: HEENT reveals a normocephalic, atraumatic skull, no scleral icterus, no oral lesions  Neck is supple without any cervical or supraclavicular adenopathy.  Lungs are clear to auscultation bilaterally. There are no wheezes, rales or rhonci Cardiac is regular rate and rhythm with a normal S1 and S2. There are no murmurs, rubs, or bruits.  Abdomen is soft with good bowel sounds, there is no palpable mass. There is no palpable hepatosplenomegaly. There is no palpable fluid wave.  Musculoskeletal no tenderness of the spine, ribs, or hips.  Extremities there are no clubbing, cyanosis, or edema.  Skin no petechia, purpura or ecchymosis Neurologic is nonfocal.  Laboratory Data:  White count 2.5, hemoglobin 11.3, hematocrit 34.1, platelets 111,000  Current Outpatient Prescriptions on File Prior to Visit  Medication Sig Dispense Refill  . alendronate (FOSAMAX) 10 MG tablet Take 10 mg by mouth once a week. Take with a full glass of water on an empty stomach.      Marland Kitchen amLODipine (NORVASC) 10 MG tablet Take 10 mg by mouth daily.        . Calcium Carbonate-Vit D-Min (CALCIUM 1200 PO) Take by mouth 2 (two) times daily.      . Cholecalciferol ([employer]S VIT D 5000 HIGH-POTENCY) 5000 UNITS capsule Take 5,000 Units by mouth daily.      Marland Kitchen CITALOPRAM HYDROBROMIDE PO Take 20 mg by mouth every morning.       Marland Kitchen  cycloSPORINE modified (NEORAL) 25 MG capsule Take 25 mg by mouth  2 (two) times daily. 3 tabs bid      . Doxepin HCl (SILENOR) 6 MG TABS Take 1 tablet (6 mg total) by mouth at bedtime as needed.  30 tablet  3  . HYDROCHLOROTHIAZIDE PO Take 12.5 mg by mouth daily. Takes 12.5 mg.      . Magnesium Oxide -Mg Supplement 400 MG CAPS Take by mouth every morning.      . Multiple Vitamins-Minerals (MULTIVITAMIN WITH MINERALS) tablet Take 1 tablet by mouth daily.        Marland Kitchen oxyCODONE (ROXICODONE) 15 MG immediate release tablet Take 15 mg by mouth every 6 (six) hours as needed.      . promethazine (PHENERGAN) 12.5 MG tablet Take 1 tablet (12.5 mg total) by mouth every 6 (six) hours as needed.  90 tablet  6  . temazepam (RESTORIL) 30 MG capsule Take 1 capsule (30 mg total) by mouth at bedtime as needed for sleep.  30 capsule  0   Assessment/Plan: This is a pleasant, 56 year old, African American female, with the following issues:  #1 anemia of chronic renal insufficiency.   Her hemoglobin looks good today.  She will not require an Aranesp injection.  We will continue to monitor her counts.   #2 autoimmune hepatitis.  She is status post history of liver transplant.  She does have chronic pancytopenia, which is most likely reflective of the transplant, as well as medication related.  #3.  Pancytopenia.  Most likely reflective of her liver transplant, as well as medication related.  She is not symptomatic.  She does not have any chronic infections.  #4  Followup.  We will follow back up with Tami [name], in 6 weeks, but before then should there be questions or concerns.

## 2012-09-27 ENCOUNTER — Ambulatory Visit (HOSPITAL_BASED_OUTPATIENT_CLINIC_OR_DEPARTMENT_OTHER): Payer: Medicaid Other

## 2012-09-27 ENCOUNTER — Other Ambulatory Visit (HOSPITAL_BASED_OUTPATIENT_CLINIC_OR_DEPARTMENT_OTHER): Payer: Medicaid Other | Admitting: Lab

## 2012-09-27 ENCOUNTER — Other Ambulatory Visit: Payer: Self-pay | Admitting: *Deleted

## 2012-09-27 VITALS — BP 130/91 | HR 93 | Temp 98.5°F | Resp 18

## 2012-09-27 DIAGNOSIS — D649 Anemia, unspecified: Secondary | ICD-10-CM

## 2012-09-27 DIAGNOSIS — G4700 Insomnia, unspecified: Secondary | ICD-10-CM

## 2012-09-27 DIAGNOSIS — D631 Anemia in chronic kidney disease: Secondary | ICD-10-CM

## 2012-09-27 LAB — CBC WITH DIFFERENTIAL (CANCER CENTER ONLY)
BASO%: 0 % (ref 0.0–2.0)
Eosinophils Absolute: 0 10*3/uL (ref 0.0–0.5)
LYMPH#: 0.6 10*3/uL — ABNORMAL LOW (ref 0.9–3.3)
LYMPH%: 28 % (ref 14.0–48.0)
MCV: 88 fL (ref 81–101)
MONO#: 0.4 10*3/uL (ref 0.1–0.9)
NEUT#: 1.1 10*3/uL — ABNORMAL LOW (ref 1.5–6.5)
Platelets: 154 10*3/uL (ref 145–400)
RBC: 3.37 10*6/uL — ABNORMAL LOW (ref 3.70–5.32)
WBC: 2 10*3/uL — ABNORMAL LOW (ref 3.9–10.0)

## 2012-09-27 MED ORDER — DARBEPOETIN ALFA-POLYSORBATE 300 MCG/0.6ML IJ SOLN
300.0000 ug | Freq: Once | INTRAMUSCULAR | Status: AC
  Administered 2012-09-27: 300 ug via SUBCUTANEOUS

## 2012-09-27 MED ORDER — TEMAZEPAM 30 MG PO CAPS: 30.0000 mg | ORAL_CAPSULE | Freq: Every evening | ORAL | Status: DC | PRN

## 2012-10-19 ENCOUNTER — Other Ambulatory Visit: Payer: Medicaid Other | Admitting: Lab

## 2012-10-19 ENCOUNTER — Telehealth: Payer: Self-pay | Admitting: Hematology & Oncology

## 2012-10-19 ENCOUNTER — Ambulatory Visit: Payer: Medicaid Other | Admitting: Hematology & Oncology

## 2012-10-19 ENCOUNTER — Ambulatory Visit: Payer: Medicaid Other

## 2012-10-19 NOTE — Telephone Encounter (Signed)
Pt called said she would not be able to come in today she was out of town and scheduled 10-25-12 appointment

## 2012-10-25 ENCOUNTER — Ambulatory Visit: Payer: Medicaid Other | Admitting: Hematology & Oncology

## 2012-10-25 ENCOUNTER — Other Ambulatory Visit: Payer: Medicaid Other | Admitting: Lab

## 2012-10-25 ENCOUNTER — Telehealth: Payer: Self-pay | Admitting: Hematology & Oncology

## 2012-10-25 ENCOUNTER — Ambulatory Visit: Payer: Medicaid Other

## 2012-10-25 NOTE — Telephone Encounter (Signed)
Left pt message to call and reschedule missed appointment from today to called back and scheduled 10-31-12 appointment

## 2012-10-31 ENCOUNTER — Ambulatory Visit: Payer: Medicaid Other | Admitting: Medical

## 2012-10-31 ENCOUNTER — Ambulatory Visit: Payer: Medicaid Other

## 2012-10-31 ENCOUNTER — Telehealth: Payer: Self-pay | Admitting: Hematology & Oncology

## 2012-10-31 ENCOUNTER — Other Ambulatory Visit: Payer: Medicaid Other | Admitting: Lab

## 2012-10-31 NOTE — Telephone Encounter (Signed)
Patient called and cx 10/31/12 apt and resch for 11/02/12.  Manuela Schwartz was notified of the cx apt

## 2012-11-02 ENCOUNTER — Ambulatory Visit (HOSPITAL_BASED_OUTPATIENT_CLINIC_OR_DEPARTMENT_OTHER): Payer: Medicaid Other | Admitting: Medical

## 2012-11-02 ENCOUNTER — Other Ambulatory Visit (HOSPITAL_BASED_OUTPATIENT_CLINIC_OR_DEPARTMENT_OTHER): Payer: Medicaid Other | Admitting: Lab

## 2012-11-02 ENCOUNTER — Ambulatory Visit: Payer: Medicaid Other

## 2012-11-02 VITALS — BP 118/67 | HR 76 | Temp 98.1°F | Resp 16 | Ht 62.0 in | Wt 137.0 lb

## 2012-11-02 DIAGNOSIS — K754 Autoimmune hepatitis: Secondary | ICD-10-CM

## 2012-11-02 DIAGNOSIS — D631 Anemia in chronic kidney disease: Secondary | ICD-10-CM

## 2012-11-02 DIAGNOSIS — N189 Chronic kidney disease, unspecified: Secondary | ICD-10-CM

## 2012-11-02 DIAGNOSIS — Z944 Liver transplant status: Secondary | ICD-10-CM

## 2012-11-02 DIAGNOSIS — D61818 Other pancytopenia: Secondary | ICD-10-CM

## 2012-11-02 DIAGNOSIS — N039 Chronic nephritic syndrome with unspecified morphologic changes: Secondary | ICD-10-CM

## 2012-11-02 LAB — CBC WITH DIFFERENTIAL (CANCER CENTER ONLY)
BASO#: 0 10*3/uL (ref 0.0–0.2)
Eosinophils Absolute: 0 10*3/uL (ref 0.0–0.5)
HCT: 34.4 % — ABNORMAL LOW (ref 34.8–46.6)
HGB: 11.6 g/dL (ref 11.6–15.9)
LYMPH%: 33 % (ref 14.0–48.0)
MCH: 29.7 pg (ref 26.0–34.0)
MCV: 88 fL (ref 81–101)
MONO%: 15 % — ABNORMAL HIGH (ref 0.0–13.0)
RBC: 3.91 10*6/uL (ref 3.70–5.32)

## 2012-11-02 NOTE — Progress Notes (Signed)
Diagnoses: #1 pancytopenia. #2 status post liver transplant for autoimmune hepatitis.  #3 chronic renal insufficiency with anemia.  Current therapy: #1 Aranesp 300 mcg subcutaneous as needed for hemoglobin less than 11.  Interim history: Tami [name] presents today for an office followup visit.  Overall, she, reports, that she's doing quite well.  Her last Aranesp injection was back on 12/5.  Her hemoglobin looks good today.  She will not need an Aranesp injection.  She's not reported any recent infections.  She does have some pancytopenia, most likely related to her autoimmune hepatitis, for which she had a liver transplant for her.  Her pancytopenia can also be related to her medications.  She reports, she has a good appetite.  She denies any nausea, vomiting, diarrhea, constipation, any chest pain, shortness of breath, or cough.  She denies any fevers, chills, or night sweats.  She denies any headaches, visual changes, or rashes.  She denies any leg swelling.  She denies any obvious, bleeding.  Review of Systems: Constitutional:Negative for malaise/fatigue, fever, chills, weight loss, diaphoresis, activity change, appetite change, and unexpected weight change.  HEENT: Negative for double vision, blurred vision, visual loss, ear pain, tinnitus, congestion, rhinorrhea, epistaxis sore throat or sinus disease, oral pain/lesion, tongue soreness Respiratory: Negative for cough, chest tightness, shortness of breath, wheezing and stridor.  Cardiovascular: Negative for chest pain, palpitations, leg swelling, orthopnea, PND, DOE or claudication Gastrointestinal: Negative for nausea, vomiting, abdominal pain, diarrhea, constipation, blood in stool, melena, hematochezia, abdominal distention, anal bleeding, rectal pain, anorexia and hematemesis.  Genitourinary: Negative for dysuria, frequency, hematuria,  Musculoskeletal: Negative for myalgias, back pain, joint swelling, arthralgias and gait problem.  Skin:  Negative for rash, color change, pallor and wound.  Neurological:. Negative for dizziness/light-headedness, tremors, seizures, syncope, facial asymmetry, speech difficulty, weakness, numbness, headaches and paresthesias.  Hematological: Negative for adenopathy. Does not bruise/bleed easily.  Psychiatric/Behavioral:  Negative for depression, no loss of interest in normal activity or change in sleep pattern.   Physical Exam: This is a pleasant, well-developed, well-nourished, 57 year old, African American, female, in no obvious distress Vitals: Temperature 98.1 degrees, pulse 76, respirations 16, blood pressure 118/67, weight 137 pounds HEENT reveals a normocephalic, atraumatic skull, no scleral icterus, no oral lesions  Neck is supple without any cervical or supraclavicular adenopathy.  Lungs are clear to auscultation bilaterally. There are no wheezes, rales or rhonci Cardiac is regular rate and rhythm with a normal S1 and S2. There are no murmurs, rubs, or bruits.  Abdomen is soft with good bowel sounds, there is no palpable mass. There is no palpable hepatosplenomegaly. There is no palpable fluid wave.  Musculoskeletal no tenderness of the spine, ribs, or hips.  Extremities there are no clubbing, cyanosis, or edema.  Skin no petechia, purpura or ecchymosis Neurologic is nonfocal.  Laboratory Data: White count 2.3, hemoglobin 1.6, hematocrit 34.4, platelets 125,000  Current Outpatient Prescriptions on File Prior to Visit  Medication Sig Dispense Refill  . alendronate (FOSAMAX) 10 MG tablet Take 10 mg by mouth once a week. Take with a full glass of water on an empty stomach.      Marland Kitchen amLODipine (NORVASC) 10 MG tablet Take 10 mg by mouth daily.        . Calcium Carbonate-Vit D-Min (CALCIUM 1200 PO) Take by mouth 2 (two) times daily.      . Cholecalciferol ([employer]S VIT D 5000 HIGH-POTENCY) 5000 UNITS capsule Take 5,000 Units by mouth daily.      Marland Kitchen CITALOPRAM HYDROBROMIDE PO Take  20 mg by mouth every  morning.       . cycloSPORINE modified (NEORAL) 25 MG capsule Take 25 mg by mouth 2 (two) times daily. 3 tabs bid      . HYDROCHLOROTHIAZIDE PO Take 12.5 mg by mouth daily. Takes 12.5 mg.      . Magnesium Oxide -Mg Supplement 400 MG CAPS Take by mouth every morning.      . Multiple Vitamins-Minerals (MULTIVITAMIN WITH MINERALS) tablet Take 1 tablet by mouth daily.         Marland Kitchen oxyCODONE (ROXICODONE) 15 MG immediate release tablet Take 15 mg by mouth every 6 (six) hours as needed.      . promethazine (PHENERGAN) 12.5 MG tablet Take 1 tablet (12.5 mg total) by mouth every 6 (six) hours as needed.  90 tablet  6  . temazepam (RESTORIL) 30 MG capsule Take 1 capsule (30 mg total) by mouth at bedtime as needed for sleep.  30 capsule  3   Assessment/Plan: This is a pleasant, 57 year old, African American female, with the following issues:  #1 anemia of chronic renal insufficiency.   Her hemoglobin looks good today.  She will not require an Aranesp injection.  We will continue to monitor her counts.   #2 autoimmune hepatitis.  She is status post history of liver transplant.  She does have chronic pancytopenia, which is most likely reflective of the transplant, as well as medication related.  #3.Pancytopenia.  Most likely reflective of her liver transplant, as well as medication related.  She is not symptomatic.  She does not have any chronic infections.  #4  Followup.  We will follow back up with Tami [name], in 6 weeks, but before then should there be questions or concerns.

## 2012-11-02 NOTE — Progress Notes (Signed)
Patient comes in today, CBC checked, Aranesp injection not given due to parameters.  Hgb was 11.6 .  Patient made aware, seen by Helayne Seminole, PA.

## 2012-11-30 ENCOUNTER — Other Ambulatory Visit: Payer: Medicaid Other | Admitting: Lab

## 2012-11-30 ENCOUNTER — Ambulatory Visit: Payer: Medicaid Other

## 2012-11-30 ENCOUNTER — Ambulatory Visit (HOSPITAL_BASED_OUTPATIENT_CLINIC_OR_DEPARTMENT_OTHER): Payer: Medicaid Other

## 2012-11-30 ENCOUNTER — Other Ambulatory Visit (HOSPITAL_BASED_OUTPATIENT_CLINIC_OR_DEPARTMENT_OTHER): Payer: Medicaid Other | Admitting: Lab

## 2012-11-30 VITALS — BP 126/83 | HR 104 | Temp 97.8°F | Resp 20

## 2012-11-30 DIAGNOSIS — N039 Chronic nephritic syndrome with unspecified morphologic changes: Secondary | ICD-10-CM

## 2012-11-30 DIAGNOSIS — D631 Anemia in chronic kidney disease: Secondary | ICD-10-CM

## 2012-11-30 DIAGNOSIS — N189 Chronic kidney disease, unspecified: Secondary | ICD-10-CM

## 2012-11-30 LAB — CBC WITH DIFFERENTIAL (CANCER CENTER ONLY)
BASO#: 0 10*3/uL (ref 0.0–0.2)
HCT: 27.5 % — ABNORMAL LOW (ref 34.8–46.6)
HGB: 9.2 g/dL — ABNORMAL LOW (ref 11.6–15.9)
LYMPH#: 0.7 10*3/uL — ABNORMAL LOW (ref 0.9–3.3)
MCHC: 33.5 g/dL (ref 32.0–36.0)
MCV: 89 fL (ref 81–101)
MONO#: 0.7 10*3/uL (ref 0.1–0.9)
NEUT%: 73.2 % (ref 39.6–80.0)
WBC: 5.3 10*3/uL (ref 3.9–10.0)

## 2012-11-30 MED ORDER — DARBEPOETIN ALFA-POLYSORBATE 300 MCG/0.6ML IJ SOLN
300.0000 ug | Freq: Once | INTRAMUSCULAR | Status: AC
  Administered 2012-11-30: 300 ug via SUBCUTANEOUS

## 2012-11-30 NOTE — Patient Instructions (Signed)
Darbepoetin Alfa injection What is this medicine? DARBEPOETIN ALFA (dar be POE e tin AL fa) helps your body make more red blood cells. It is used to treat anemia caused by chronic kidney failure and chemotherapy. This medicine may be used for other purposes; ask your health care provider or pharmacist if you have questions. What should I tell my health care provider before I take this medicine? They need to know if you have any of these conditions: -blood clotting disorders or history of blood clots -cancer patient not on chemotherapy -cystic fibrosis -heart disease, such as angina, heart failure, or a history of a heart attack -hemoglobin level of 12 g/dL or greater -high blood pressure -low levels of folate, iron, or vitamin B12 -seizures -an unusual or allergic reaction to darbepoetin, erythropoietin, albumin, hamster proteins, latex, other medicines, foods, dyes, or preservatives -pregnant or trying to get pregnant -breast-feeding How should I use this medicine? This medicine is for injection into a vein or under the skin. It is usually given by a health care professional in a hospital or clinic setting. If you get this medicine at home, you will be taught how to prepare and give this medicine. Do not shake the solution before you withdraw a dose. Use exactly as directed. Take your medicine at regular intervals. Do not take your medicine more often than directed. It is important that you put your used needles and syringes in a special sharps container. Do not put them in a trash can. If you do not have a sharps container, call your pharmacist or healthcare provider to get one. Talk to your pediatrician regarding the use of this medicine in children. While this medicine may be used in children as young as 1 year for selected conditions, precautions do apply. Overdosage: If you think you have taken too much of this medicine contact a poison control center or emergency room at once. NOTE:  This medicine is only for you. Do not share this medicine with others. What if I miss a dose? If you miss a dose, take it as soon as you can. If it is almost time for your next dose, take only that dose. Do not take double or extra doses. What may interact with this medicine? Do not take this medicine with any of the following medications: -epoetin alfa This list may not describe all possible interactions. Give your health care provider a list of all the medicines, herbs, non-prescription drugs, or dietary supplements you use. Also tell them if you smoke, drink alcohol, or use illegal drugs. Some items may interact with your medicine. What should I watch for while using this medicine? Visit your prescriber or health care professional for regular checks on your progress and for the needed blood tests and blood pressure measurements. It is especially important for the doctor to make sure your hemoglobin level is in the desired range, to limit the risk of potential side effects and to give you the best benefit. Keep all appointments for any recommended tests. Check your blood pressure as directed. Ask your doctor what your blood pressure should be and when you should contact him or her. As your body makes more red blood cells, you may need to take iron, folic acid, or vitamin B supplements. Ask your doctor or health care provider which products are right for you. If you have kidney disease continue dietary restrictions, even though this medication can make you feel better. Talk with your doctor or health care professional about the   foods you eat and the vitamins that you take. What side effects may I notice from receiving this medicine? Side effects that you should report to your doctor or health care professional as soon as possible: -allergic reactions like skin rash, itching or hives, swelling of the face, lips, or tongue -breathing problems -changes in vision -chest pain -confusion, trouble speaking  or understanding -feeling faint or lightheaded, falls -high blood pressure -muscle aches or pains -pain, swelling, warmth in the leg -rapid weight gain -severe headaches -sudden numbness or weakness of the face, arm or leg -trouble walking, dizziness, loss of balance or coordination -seizures (convulsions) -swelling of the ankles, feet, hands -unusually weak or tired Side effects that usually do not require medical attention (report to your doctor or health care professional if they continue or are bothersome): -diarrhea -fever, chills (flu-like symptoms) -headaches -nausea, vomiting -redness, stinging, or swelling at site where injected This list may not describe all possible side effects. Call your doctor for medical advice about side effects. You may report side effects to FDA at 1-800-FDA-1088. Where should I keep my medicine? Keep out of the reach of children. Store in a refrigerator between 2 and 8 degrees C (36 and 46 degrees F). Do not freeze. Do not shake. Throw away any unused portion if using a single-dose vial. Throw away any unused medicine after the expiration date. NOTE: This sheet is a summary. It may not cover all possible information. If you have questions about this medicine, talk to your doctor, pharmacist, or health care provider.  2013, [employer]. (09/23/2008 10:23:57 AM)

## 2012-12-10 ENCOUNTER — Encounter (HOSPITAL_BASED_OUTPATIENT_CLINIC_OR_DEPARTMENT_OTHER): Payer: Self-pay | Admitting: *Deleted

## 2012-12-10 ENCOUNTER — Emergency Department (HOSPITAL_BASED_OUTPATIENT_CLINIC_OR_DEPARTMENT_OTHER)
Admission: EM | Admit: 2012-12-10 | Discharge: 2012-12-10 | Disposition: A | Discharge status: Home / Self Care | Payer: Medicaid Other | Attending: Emergency Medicine | Admitting: Emergency Medicine

## 2012-12-10 DIAGNOSIS — Z8719 Personal history of other diseases of the digestive system: Secondary | ICD-10-CM | POA: Insufficient documentation

## 2012-12-10 DIAGNOSIS — X500XXA Overexertion from strenuous movement or load, initial encounter: Secondary | ICD-10-CM | POA: Insufficient documentation

## 2012-12-10 DIAGNOSIS — Y9289 Other specified places as the place of occurrence of the external cause: Secondary | ICD-10-CM | POA: Insufficient documentation

## 2012-12-10 DIAGNOSIS — Y9389 Activity, other specified: Secondary | ICD-10-CM | POA: Insufficient documentation

## 2012-12-10 DIAGNOSIS — Z8739 Personal history of other diseases of the musculoskeletal system and connective tissue: Secondary | ICD-10-CM | POA: Insufficient documentation

## 2012-12-10 DIAGNOSIS — I10 Essential (primary) hypertension: Secondary | ICD-10-CM | POA: Insufficient documentation

## 2012-12-10 DIAGNOSIS — S39012A Strain of muscle, fascia and tendon of lower back, initial encounter: Secondary | ICD-10-CM

## 2012-12-10 DIAGNOSIS — S335XXA Sprain of ligaments of lumbar spine, initial encounter: Secondary | ICD-10-CM | POA: Insufficient documentation

## 2012-12-10 DIAGNOSIS — Z862 Personal history of diseases of the blood and blood-forming organs and certain disorders involving the immune mechanism: Secondary | ICD-10-CM | POA: Insufficient documentation

## 2012-12-10 DIAGNOSIS — Z79899 Other long term (current) drug therapy: Secondary | ICD-10-CM | POA: Insufficient documentation

## 2012-12-10 MED ORDER — OXYCODONE-ACETAMINOPHEN 5-325 MG PO TABS
2.0000 | ORAL_TABLET | Freq: Once | ORAL | Status: AC
  Administered 2012-12-10: 2 via ORAL
  Filled 2012-12-10 (×2): qty 2

## 2012-12-10 NOTE — ED Notes (Signed)
Pt. Reports ability to walk and she held on yesterday and didn't actually fall she reports to RN she just twisted and slipped.

## 2012-12-10 NOTE — ED Notes (Signed)
Slipped on ice and twisted her back yesterday. No pain relief with Tylenol.

## 2012-12-10 NOTE — ED Provider Notes (Signed)
History     CSN: [id number]  Arrival date & time 12/10/12  1408   First MD Initiated Contact with Patient 12/10/12 1420      Chief Complaint  Patient presents with  . Fall    HPI Pt is a 57 yo F with PMH of HTN, hepatitis, anemia and osteoporosis presenting with lower back pain after slipping on ice yesterday. Pt states she was holding onto railing in an icy walkway and her feet slid out from under her while she was still holding on. She states she twisted her back and has had lower back pain since then. Her pain does not radiate. She does not have any numbness, tingling, weakness, or incontinence. She has tried Tylenol with no relief.  Past Medical History  Diagnosis Date  . Hypertension   . Hepatitis, autoimmune 12/08/2011    Past Surgical History  Procedure Laterality Date  . Liver transplant      1996  . Tonsillectomy    . Abdominal hysterectomy      No family history on file.  History  Substance Use Topics  . Smoking status: Never Smoker   . Smokeless tobacco: Not on file  . Alcohol Use: Yes    OB History   Grav Para Term Preterm Abortions TAB SAB Ect Mult Living                  Review of Systems  Constitutional: Positive for activity change. Negative for fever and chills.  Respiratory: Negative for shortness of breath.   Cardiovascular: Negative for chest pain.  Gastrointestinal: Negative for nausea, vomiting and abdominal pain.  Musculoskeletal: Positive for back pain.  Skin: Negative for rash.  Neurological: Negative for headaches.  All other systems reviewed and are negative.    Allergies  Review of patient's allergies indicates no known allergies.  Home Medications   Current Outpatient Rx  Name  Route  Sig  Dispense  Refill  . alendronate (FOSAMAX) 10 MG tablet   Oral   Take 10 mg by mouth once a week. Take with a full glass of water on an empty stomach.         Marland Kitchen amLODipine (NORVASC) 10 MG tablet   Oral   Take 10 mg by mouth daily.            . Calcium Carbonate-Vit D-Min (CALCIUM 1200 PO)   Oral   Take by mouth 2 (two) times daily.         . Cholecalciferol (CVS VIT D 5000 HIGH-POTENCY) 5000 UNITS capsule   Oral   Take 5,000 Units by mouth daily.         Marland Kitchen CITALOPRAM HYDROBROMIDE PO   Oral   Take 20 mg by mouth every morning.          . cycloSPORINE modified (NEORAL) 25 MG capsule   Oral   Take 25 mg by mouth 2 (two) times daily. 3 tabs bid         . HYDROCHLOROTHIAZIDE PO   Oral   Take 12.5 mg by mouth daily. Takes 12.5 mg.         . Magnesium Oxide -Mg Supplement 400 MG CAPS   Oral   Take by mouth every morning.         . Multiple Vitamins-Minerals (MULTIVITAMIN WITH MINERALS) tablet   Oral   Take 1 tablet by mouth daily.           Marland Kitchen oxyCODONE (ROXICODONE) 15 MG immediate release  tablet   Oral   Take 15 mg by mouth every 6 (six) hours as needed.         . promethazine (PHENERGAN) 12.5 MG tablet   Oral   Take 1 tablet (12.5 mg total) by mouth every 6 (six) hours as needed.   90 tablet   6   . temazepam (RESTORIL) 30 MG capsule   Oral   Take 1 capsule (30 mg total) by mouth at bedtime as needed for sleep.   30 capsule   3     BP 140/90  Pulse 116  Temp(Src) 98.8 F (37.1 C) (Oral)  Resp 20  SpO2 98%  Physical Exam  Constitutional: She is oriented to person, place, and time. She appears well-developed and well-nourished. No distress.  HENT:  Head: Normocephalic and atraumatic.  Eyes: Pupils are equal, round, and reactive to light.  Neck: Normal range of motion. Neck supple.  Cardiovascular: Regular rhythm and normal pulses.  Tachycardia present.   Pulmonary/Chest: Effort normal and breath sounds normal. She has no wheezes.  Abdominal: Soft. There is no tenderness.  Musculoskeletal:       Lumbar back: She exhibits bony tenderness. She exhibits normal range of motion (Slowly moving, but able to move in all directions), no swelling, no edema, no deformity and no  spasm.  Negative straight leg raise  Lymphadenopathy:    She has no cervical adenopathy.  Neurological: She is alert and oriented to person, place, and time.  Strength and sensation equal in lower extremities. No focal deficit. Able to ambulate.    ED Course  Procedures (including critical care time)  Labs Reviewed - No data to display No results found.   1. Lumbar strain     MDM  57 yo F with acute onset lower back pain after slipping on ice  No red flag symptoms. Will give Percocet while she is here and d/c home. She should take her home pain medications as needed for pain, in addition to Tylenol. She can use heat/ice and begin exercising her back. D/c home in stable condition and should follow up with her PCP as needed.    Montez Morita, MD 12/10/12 [id number]

## 2012-12-10 NOTE — ED Notes (Signed)
MD at bedside. 

## 2012-12-10 NOTE — ED Provider Notes (Signed)
I have personally seen and examined the patient.  I have discussed the plan of care with the resident.  I have reviewed the documentation on PMH/FH/Soc. History.  I have reviewed the documentation of the resident and agree.  Pt able to ambulate without difficutly. Stable for d/c.  Do not feel imaging warranted  Sharyon Cable, MD 12/10/12 1454

## 2013-01-01 ENCOUNTER — Telehealth: Payer: Self-pay | Admitting: Hematology & Oncology

## 2013-01-01 NOTE — Telephone Encounter (Signed)
I received a call from Dr.Sherrill to cx patient's doc visit for 01/02/13.  Dr.Sherrill wants to see a new patient on 01/02/13 at 11am.  He stated for patient to be resch to see Dr.Ennever on her next visit

## 2013-01-02 ENCOUNTER — Ambulatory Visit (HOSPITAL_BASED_OUTPATIENT_CLINIC_OR_DEPARTMENT_OTHER): Payer: Medicaid Other

## 2013-01-02 ENCOUNTER — Ambulatory Visit: Payer: Medicaid Other

## 2013-01-02 ENCOUNTER — Other Ambulatory Visit (HOSPITAL_BASED_OUTPATIENT_CLINIC_OR_DEPARTMENT_OTHER): Payer: Medicaid Other | Admitting: Lab

## 2013-01-02 VITALS — BP 114/78 | HR 94 | Temp 98.1°F | Resp 20

## 2013-01-02 DIAGNOSIS — N189 Chronic kidney disease, unspecified: Secondary | ICD-10-CM

## 2013-01-02 DIAGNOSIS — D631 Anemia in chronic kidney disease: Secondary | ICD-10-CM

## 2013-01-02 LAB — CBC WITH DIFFERENTIAL (CANCER CENTER ONLY)
BASO#: 0 10*3/uL (ref 0.0–0.2)
EOS%: 2 % (ref 0.0–7.0)
HCT: 29.6 % — ABNORMAL LOW (ref 34.8–46.6)
HGB: 9.9 g/dL — ABNORMAL LOW (ref 11.6–15.9)
LYMPH%: 33.2 % (ref 14.0–48.0)
MCH: 29.6 pg (ref 26.0–34.0)
MCHC: 33.4 g/dL (ref 32.0–36.0)
MCV: 88 fL (ref 81–101)
MONO%: 15.3 % — ABNORMAL HIGH (ref 0.0–13.0)
NEUT%: 49.5 % (ref 39.6–80.0)

## 2013-01-02 LAB — TECHNOLOGIST REVIEW CHCC SATELLITE

## 2013-01-02 MED ORDER — DARBEPOETIN ALFA-POLYSORBATE 300 MCG/0.6ML IJ SOLN
300.0000 ug | Freq: Once | INTRAMUSCULAR | Status: AC
  Administered 2013-01-02: 300 ug via SUBCUTANEOUS

## 2013-01-02 NOTE — Patient Instructions (Signed)
Darbepoetin Alfa injection What is this medicine? DARBEPOETIN ALFA (dar be POE e tin AL fa) helps your body make more red blood cells. It is used to treat anemia caused by chronic kidney failure and chemotherapy. This medicine may be used for other purposes; ask your health care provider or pharmacist if you have questions. What should I tell my health care provider before I take this medicine? They need to know if you have any of these conditions: -blood clotting disorders or history of blood clots -cancer patient not on chemotherapy -cystic fibrosis -heart disease, such as angina, heart failure, or a history of a heart attack -hemoglobin level of 12 g/dL or greater -high blood pressure -low levels of folate, iron, or vitamin B12 -seizures -an unusual or allergic reaction to darbepoetin, erythropoietin, albumin, hamster proteins, latex, other medicines, foods, dyes, or preservatives -pregnant or trying to get pregnant -breast-feeding How should I use this medicine? This medicine is for injection into a vein or under the skin. It is usually given by a health care professional in a hospital or clinic setting. If you get this medicine at home, you will be taught how to prepare and give this medicine. Do not shake the solution before you withdraw a dose. Use exactly as directed. Take your medicine at regular intervals. Do not take your medicine more often than directed. It is important that you put your used needles and syringes in a special sharps container. Do not put them in a trash can. If you do not have a sharps container, call your pharmacist or healthcare provider to get one. Talk to your pediatrician regarding the use of this medicine in children. While this medicine may be used in children as young as 1 year for selected conditions, precautions do apply. Overdosage: If you think you have taken too much of this medicine contact a poison control center or emergency room at once. NOTE:  This medicine is only for you. Do not share this medicine with others. What if I miss a dose? If you miss a dose, take it as soon as you can. If it is almost time for your next dose, take only that dose. Do not take double or extra doses. What may interact with this medicine? Do not take this medicine with any of the following medications: -epoetin alfa This list may not describe all possible interactions. Give your health care provider a list of all the medicines, herbs, non-prescription drugs, or dietary supplements you use. Also tell them if you smoke, drink alcohol, or use illegal drugs. Some items may interact with your medicine. What should I watch for while using this medicine? Visit your prescriber or health care professional for regular checks on your progress and for the needed blood tests and blood pressure measurements. It is especially important for the doctor to make sure your hemoglobin level is in the desired range, to limit the risk of potential side effects and to give you the best benefit. Keep all appointments for any recommended tests. Check your blood pressure as directed. Ask your doctor what your blood pressure should be and when you should contact him or her. As your body makes more red blood cells, you may need to take iron, folic acid, or vitamin B supplements. Ask your doctor or health care provider which products are right for you. If you have kidney disease continue dietary restrictions, even though this medication can make you feel better. Talk with your doctor or health care professional about the   foods you eat and the vitamins that you take. What side effects may I notice from receiving this medicine? Side effects that you should report to your doctor or health care professional as soon as possible: -allergic reactions like skin rash, itching or hives, swelling of the face, lips, or tongue -breathing problems -changes in vision -chest pain -confusion, trouble speaking  or understanding -feeling faint or lightheaded, falls -high blood pressure -muscle aches or pains -pain, swelling, warmth in the leg -rapid weight gain -severe headaches -sudden numbness or weakness of the face, arm or leg -trouble walking, dizziness, loss of balance or coordination -seizures (convulsions) -swelling of the ankles, feet, hands -unusually weak or tired Side effects that usually do not require medical attention (report to your doctor or health care professional if they continue or are bothersome): -diarrhea -fever, chills (flu-like symptoms) -headaches -nausea, vomiting -redness, stinging, or swelling at site where injected This list may not describe all possible side effects. Call your doctor for medical advice about side effects. You may report side effects to FDA at 1-800-FDA-1088. Where should I keep my medicine? Keep out of the reach of children. Store in a refrigerator between 2 and 8 degrees C (36 and 46 degrees F). Do not freeze. Do not shake. Throw away any unused portion if using a single-dose vial. Throw away any unused medicine after the expiration date. NOTE: This sheet is a summary. It may not cover all possible information. If you have questions about this medicine, talk to your doctor, pharmacist, or health care provider.  2013, [employer]. (09/23/2008 10:23:57 AM)

## 2013-01-04 ENCOUNTER — Other Ambulatory Visit: Payer: Medicaid Other | Admitting: Lab

## 2013-01-04 ENCOUNTER — Ambulatory Visit: Payer: Medicaid Other

## 2013-01-04 ENCOUNTER — Ambulatory Visit: Payer: Medicaid Other | Admitting: Internal Medicine

## 2013-01-04 ENCOUNTER — Ambulatory Visit: Payer: Medicaid Other | Admitting: Hematology & Oncology

## 2013-01-04 ENCOUNTER — Ambulatory Visit: Payer: Medicaid Other | Admitting: Medical

## 2013-01-22 ENCOUNTER — Telehealth: Payer: Self-pay | Admitting: Hematology & Oncology

## 2013-01-22 NOTE — Telephone Encounter (Signed)
Pt made 4-28 appointment wants to see Dr. Marin Olp

## 2013-02-18 ENCOUNTER — Other Ambulatory Visit (HOSPITAL_BASED_OUTPATIENT_CLINIC_OR_DEPARTMENT_OTHER): Payer: Medicaid Other | Admitting: Lab

## 2013-02-18 ENCOUNTER — Ambulatory Visit (HOSPITAL_BASED_OUTPATIENT_CLINIC_OR_DEPARTMENT_OTHER): Payer: Medicaid Other | Admitting: Hematology & Oncology

## 2013-02-18 VITALS — BP 131/84 | HR 98 | Temp 97.6°F | Resp 16 | Ht 62.0 in | Wt 129.0 lb

## 2013-02-18 DIAGNOSIS — D631 Anemia in chronic kidney disease: Secondary | ICD-10-CM

## 2013-02-18 DIAGNOSIS — D61818 Other pancytopenia: Secondary | ICD-10-CM

## 2013-02-18 DIAGNOSIS — N189 Chronic kidney disease, unspecified: Secondary | ICD-10-CM

## 2013-02-18 DIAGNOSIS — K754 Autoimmune hepatitis: Secondary | ICD-10-CM

## 2013-02-18 LAB — CBC WITH DIFFERENTIAL (CANCER CENTER ONLY)
BASO#: 0 10*3/uL (ref 0.0–0.2)
HCT: 31 % — ABNORMAL LOW (ref 34.8–46.6)
HGB: 10.3 g/dL — ABNORMAL LOW (ref 11.6–15.9)
LYMPH#: 0.2 10*3/uL — ABNORMAL LOW (ref 0.9–3.3)
LYMPH%: 12.3 % — ABNORMAL LOW (ref 14.0–48.0)
MCV: 89 fL (ref 81–101)
MONO#: 0.3 10*3/uL (ref 0.1–0.9)
NEUT%: 69 % (ref 39.6–80.0)
RDW: 12.9 % (ref 11.1–15.7)
WBC: 1.9 10*3/uL — ABNORMAL LOW (ref 3.9–10.0)

## 2013-02-18 LAB — COMPREHENSIVE METABOLIC PANEL
ALT: 12 U/L (ref 0–35)
AST: 23 U/L (ref 0–37)
Albumin: 3.7 g/dL (ref 3.5–5.2)
Alkaline Phosphatase: 98 U/L (ref 39–117)
Calcium: 8.9 mg/dL (ref 8.4–10.5)
Chloride: 99 mEq/L (ref 96–112)
Potassium: 4.1 mEq/L (ref 3.5–5.3)
Sodium: 133 mEq/L — ABNORMAL LOW (ref 135–145)
Total Protein: 8.5 g/dL — ABNORMAL HIGH (ref 6.0–8.3)

## 2013-02-18 LAB — IRON AND TIBC
%SAT: 20 % (ref 20–55)
TIBC: 171 ug/dL — ABNORMAL LOW (ref 250–470)
UIBC: 136 ug/dL (ref 125–400)

## 2013-02-18 NOTE — Progress Notes (Signed)
This office note has been dictated.

## 2013-02-19 NOTE — Progress Notes (Signed)
DIAGNOSES: 1. Chronic pancytopenia. 2. Status post liver transplant for autoimmune hepatitis. 3. Renal insufficiency with anemia.  CURRENT THERAPY:  Aranesp 300 mcg subcu as needed for hemoglobin of 11.  INTERIM HISTORY:  Tami [name] comes in for followup.  She is doing okay. She is a little bit worried about her kidney function.  She saw her family doctor.  Apparently, she was told that she needs to see a "specialist" because her kidney function was getting worse.  I am not sure if she is going to go back to [facility] for this of if she is going to see a local nephrologist.  We last checked her kidney function in October.  BUN was 32 and creatinine was 1.46.  This was up a little bit for her.  She has had no problems with her iron studies.  Her iron saturation back in October 2013 was 62%.  She has had some hot flashes and sweats.  I told her this likely was from the "change of life."  This is something that she may need to see her gynecologist for.  She has had no bleeding.  She has had no change in bowel or bladder habits.  PHYSICAL EXAMINATION:  General:  This is a petite black female in no obvious distress.  Vital signs:  Temperature of 97.6, pulse 98, respiratory rate 16, blood pressure 131/84.  Weight is 129.  Head and neck:  Normocephalic, atraumatic skull.  There are no ocular or oral lesions.  There are no palpable cervical or supraclavicular lymph nodes. Lungs:  Clear bilaterally.  Cardiac:  Regular rate and rhythm with a normal S1 and S2.  There are no murmurs, rubs, or bruits.  Abdomen: Soft with good bowel sounds.  There is no palpable abdominal mass.  She has a well-healed hepatectomy scar.  She has no fluid wave.  No palpable hepatosplenomegaly.  Extremities:  No clubbing, cyanosis, or edema.  LABORATORY STUDIES:  White cell count of 1.9, hemoglobin 10.3, hematocrit 31, platelet count 121.  IMPRESSION:  Tami [name] is a very nice 57 year old black female who  has had a liver transplant.  She has chronic pancytopenia.  Her blood counts actually are pretty good today.  Hopefully, we can hold off on giving her an Aranesp injection.  We will plan to get her back in another month or so.  I am not sure what is going on with her renal function.  We are checking her metabolic panel today.    ______________________________ Tami [name], M.D. PRE/MEDQ  D:  02/18/2013  T:  02/19/2013  Job:  [id number]

## 2013-03-11 ENCOUNTER — Telehealth: Payer: Self-pay | Admitting: Hematology & Oncology

## 2013-03-11 NOTE — Telephone Encounter (Signed)
Pt called had questions about why her appointment was in June instead of monthly, she left message with RN

## 2013-03-25 ENCOUNTER — Ambulatory Visit (HOSPITAL_BASED_OUTPATIENT_CLINIC_OR_DEPARTMENT_OTHER): Payer: Medicaid Other | Admitting: Lab

## 2013-03-25 ENCOUNTER — Ambulatory Visit (HOSPITAL_BASED_OUTPATIENT_CLINIC_OR_DEPARTMENT_OTHER): Payer: Medicaid Other

## 2013-03-25 ENCOUNTER — Other Ambulatory Visit: Payer: Self-pay | Admitting: *Deleted

## 2013-03-25 DIAGNOSIS — N189 Chronic kidney disease, unspecified: Secondary | ICD-10-CM

## 2013-03-25 DIAGNOSIS — K754 Autoimmune hepatitis: Secondary | ICD-10-CM

## 2013-03-25 DIAGNOSIS — N039 Chronic nephritic syndrome with unspecified morphologic changes: Secondary | ICD-10-CM

## 2013-03-25 DIAGNOSIS — R5383 Other fatigue: Secondary | ICD-10-CM

## 2013-03-25 DIAGNOSIS — D631 Anemia in chronic kidney disease: Secondary | ICD-10-CM

## 2013-03-25 DIAGNOSIS — R5381 Other malaise: Secondary | ICD-10-CM

## 2013-03-25 LAB — CBC WITH DIFFERENTIAL (CANCER CENTER ONLY)
BASO%: 0 % (ref 0.0–2.0)
EOS%: 2.1 % (ref 0.0–7.0)
HCT: 26.9 % — ABNORMAL LOW (ref 34.8–46.6)
LYMPH#: 0.5 10*3/uL — ABNORMAL LOW (ref 0.9–3.3)
MCHC: 33.1 g/dL (ref 32.0–36.0)
MONO#: 0.3 10*3/uL (ref 0.1–0.9)
NEUT#: 1.6 10*3/uL (ref 1.5–6.5)
NEUT%: 66.7 % (ref 39.6–80.0)
Platelets: 107 10*3/uL — ABNORMAL LOW (ref 145–400)
RDW: 13.9 % (ref 11.1–15.7)
WBC: 2.4 10*3/uL — ABNORMAL LOW (ref 3.9–10.0)

## 2013-03-25 MED ORDER — DARBEPOETIN ALFA-POLYSORBATE 300 MCG/0.6ML IJ SOLN
300.0000 ug | Freq: Once | INTRAMUSCULAR | Status: AC
  Administered 2013-03-25: 300 ug via SUBCUTANEOUS

## 2013-03-30 DIAGNOSIS — R0602 Shortness of breath: Secondary | ICD-10-CM | POA: Insufficient documentation

## 2013-03-30 DIAGNOSIS — F418 Other specified anxiety disorders: Secondary | ICD-10-CM | POA: Insufficient documentation

## 2013-03-30 DIAGNOSIS — Z944 Liver transplant status: Secondary | ICD-10-CM | POA: Insufficient documentation

## 2013-03-30 DIAGNOSIS — M810 Age-related osteoporosis without current pathological fracture: Secondary | ICD-10-CM | POA: Insufficient documentation

## 2013-03-30 DIAGNOSIS — E785 Hyperlipidemia, unspecified: Secondary | ICD-10-CM | POA: Insufficient documentation

## 2013-03-30 DIAGNOSIS — E559 Vitamin D deficiency, unspecified: Secondary | ICD-10-CM | POA: Insufficient documentation

## 2013-03-30 DIAGNOSIS — M25569 Pain in unspecified knee: Secondary | ICD-10-CM | POA: Insufficient documentation

## 2013-03-30 DIAGNOSIS — M40209 Unspecified kyphosis, site unspecified: Secondary | ICD-10-CM | POA: Insufficient documentation

## 2013-03-30 DIAGNOSIS — G4700 Insomnia, unspecified: Secondary | ICD-10-CM | POA: Insufficient documentation

## 2013-03-30 DIAGNOSIS — R5383 Other fatigue: Secondary | ICD-10-CM | POA: Insufficient documentation

## 2013-04-05 ENCOUNTER — Telehealth: Payer: Self-pay | Admitting: Hematology & Oncology

## 2013-04-05 NOTE — Telephone Encounter (Signed)
Faxed Medical Records through Epic today to: [facility]        [place], [place] [address]        [place]: [phone]        [place]: [phone]  Medical  Records requested from [place] SCANNED

## 2013-04-10 ENCOUNTER — Telehealth: Payer: Self-pay | Admitting: Hematology & Oncology

## 2013-04-10 NOTE — Telephone Encounter (Signed)
Heather from [facility] called (fax [phone]) needs note and labs for pt. I faxed it too her.

## 2013-04-19 ENCOUNTER — Ambulatory Visit (HOSPITAL_BASED_OUTPATIENT_CLINIC_OR_DEPARTMENT_OTHER): Payer: Medicaid Other

## 2013-04-19 ENCOUNTER — Ambulatory Visit: Payer: Medicaid Other | Admitting: Lab

## 2013-04-19 ENCOUNTER — Other Ambulatory Visit: Payer: Self-pay | Admitting: Oncology

## 2013-04-19 ENCOUNTER — Other Ambulatory Visit (HOSPITAL_BASED_OUTPATIENT_CLINIC_OR_DEPARTMENT_OTHER): Payer: Medicaid Other

## 2013-04-19 ENCOUNTER — Ambulatory Visit (HOSPITAL_BASED_OUTPATIENT_CLINIC_OR_DEPARTMENT_OTHER): Payer: Medicaid Other | Admitting: Medical

## 2013-04-19 VITALS — BP 141/92 | HR 77 | Temp 97.1°F | Resp 16

## 2013-04-19 DIAGNOSIS — N189 Chronic kidney disease, unspecified: Secondary | ICD-10-CM

## 2013-04-19 DIAGNOSIS — D631 Anemia in chronic kidney disease: Secondary | ICD-10-CM

## 2013-04-19 DIAGNOSIS — N039 Chronic nephritic syndrome with unspecified morphologic changes: Secondary | ICD-10-CM

## 2013-04-19 LAB — CBC WITH DIFFERENTIAL (CANCER CENTER ONLY)
BASO#: 0 10*3/uL (ref 0.0–0.2)
BASO%: 0 % (ref 0.0–2.0)
EOS%: 1.4 % (ref 0.0–7.0)
HCT: 31.6 % — ABNORMAL LOW (ref 34.8–46.6)
HGB: 10.3 g/dL — ABNORMAL LOW (ref 11.6–15.9)
LYMPH#: 0.5 10*3/uL — ABNORMAL LOW (ref 0.9–3.3)
MCHC: 32.6 g/dL (ref 32.0–36.0)
MONO#: 0.3 10*3/uL (ref 0.1–0.9)
NEUT#: 1.3 10*3/uL — ABNORMAL LOW (ref 1.5–6.5)
NEUT%: 61.4 % (ref 39.6–80.0)
WBC: 2.2 10*3/uL — ABNORMAL LOW (ref 3.9–10.0)

## 2013-04-19 MED ORDER — TEMAZEPAM 30 MG PO CAPS: 30.0000 mg | ORAL_CAPSULE | Freq: Every evening | ORAL | Status: DC | PRN

## 2013-04-19 MED ORDER — DARBEPOETIN ALFA-POLYSORBATE 300 MCG/0.6ML IJ SOLN
300.0000 ug | Freq: Once | INTRAMUSCULAR | Status: AC
  Administered 2013-04-19: 300 ug via SUBCUTANEOUS

## 2013-04-19 NOTE — Progress Notes (Signed)
DIAGNOSES: 1. Chronic pancytopenia. 2. Status post liver transplant for autoimmune hepatitis. 3. Renal insufficiency with anemia.  CURRENT THERAPY:  Aranesp 300 mcg subcu as needed for hemoglobin of 11.  INTERIM HISTORY: Tami [name] presents today for an office followup visit.  Overall she reports that she's doing quite well.  She's not reported any new complications.  She continues to followup at [facility] in regards to her I don't need hepatitis.  She did have a liver transplant.  She does see a nephrologist for her kidney issues.  Back in April, her BUN was 31 and creatinine 1.67.  She does receive Aranesp for hemoglobin below 11.  Her hemoglobin is 10.3, as such she will receive an Aranesp injection.  Her last iron panel back in April revealed a ferritin of 1722, iron of 35 with 20% saturation.  She reports a decent appetite she denies any changes in her bowel or bladder habits.  She denies any nausea, vomiting, diarrhea or constipation.  She denies any fevers, chills or night sweats.  She denies any chest pain, shortness of breath or cough.  She denies any abdominal pain.  She denies any lower extremity swelling.  She denies any obvious or abnormal bleeding.  She denies any headaches, visual changes or rashes.  Review of Systems: Constitutional:Negative for malaise/fatigue, fever, chills, weight loss, diaphoresis, activity change, appetite change, and unexpected weight change.  HEENT: Negative for double vision, blurred vision, visual loss, ear pain, tinnitus, congestion, rhinorrhea, epistaxis sore throat or sinus disease, oral pain/lesion, tongue soreness Respiratory: Negative for cough, chest tightness, shortness of breath, wheezing and stridor.  Cardiovascular: Negative for chest pain, palpitations, leg swelling, orthopnea, PND, DOE or claudication Gastrointestinal: Negative for nausea, vomiting, abdominal pain, diarrhea, constipation, blood in stool, melena, hematochezia, abdominal distention,  anal bleeding, rectal pain, anorexia and hematemesis.  Genitourinary: Negative for dysuria, frequency, hematuria,  Musculoskeletal: Negative for myalgias, back pain, joint swelling, arthralgias and gait problem.  Skin: Negative for rash, color change, pallor and wound.  Neurological:. Negative for dizziness/light-headedness, tremors, seizures, syncope, facial asymmetry, speech difficulty, weakness, numbness, headaches and paresthesias.  Hematological: Negative for adenopathy. Does not bruise/bleed easily.  Psychiatric/Behavioral:  Negative for depression, no loss of interest in normal activity or change in sleep pattern.   Physical Exam: This is a pleasant 57 year old well-developed well-nourished African American female in no obvious distress. Vitals: Temperature 97.1 degrees pulse 77 respirations 16 blood pressure 141/92 weight 135lbs HEENT reveals a normocephalic, atraumatic skull, no scleral icterus, no oral lesions  Neck is supple without any cervical or supraclavicular adenopathy.  Lungs are clear to auscultation bilaterally. There are no wheezes, rales or rhonci Cardiac is regular rate and rhythm with a normal S1 and S2. There are no murmurs, rubs, or bruits.  Abdomen is soft with good bowel sounds, there is no palpable mass. There is no palpable hepatosplenomegaly. There is no palpable fluid wave.  Musculoskeletal no tenderness of the spine, ribs, or hips.  Extremities there are no clubbing, cyanosis, or edema.  Skin no petechia, purpura or ecchymosis Neurologic is nonfocal.  Laboratory Data: White count 2.2 hemoglobin 10.3 hematocrit 31.6 platelets 104,000  Current Outpatient Prescriptions on File Prior to Visit  Medication Sig Dispense Refill  . alendronate (FOSAMAX) 10 MG tablet Take 10 mg by mouth once a week. Take with a full glass of water on an empty stomach.      Marland Kitchen amLODipine (NORVASC) 10 MG tablet Take 10 mg by mouth daily.        Marland Kitchen  Calcium Carbonate-Vit D-Min (CALCIUM  1200 PO) Take by mouth 2 (two) times daily.      . Cholecalciferol (CVS VIT D 5000 HIGH-POTENCY) 5000 UNITS capsule Take 5,000 Units by mouth daily.      Marland Kitchen CITALOPRAM HYDROBROMIDE PO Take 20 mg by mouth every morning.       . cycloSPORINE modified (NEORAL) 25 MG capsule Take 25 mg by mouth 2 (two) times daily. 3 tabs bid      . HYDROCHLOROTHIAZIDE PO Take 12.5 mg by mouth daily. Takes 12.5 mg.      . Magnesium Oxide -Mg Supplement 400 MG CAPS Take by mouth every morning.      . Multiple Vitamins-Minerals (MULTIVITAMIN WITH MINERALS) tablet Take 1 tablet by mouth daily.        Marland Kitchen oxyCODONE (ROXICODONE) 15 MG immediate release tablet Take 15 mg by mouth every 6 (six) hours as needed.      . promethazine (PHENERGAN) 12.5 MG tablet Take 1 tablet (12.5 mg total) by mouth every 6 (six) hours as needed.  90 tablet  6   No current facility-administered medications on file prior to visit.   Assessment/Plan: This is a pleasant 57 year old [name] with the following issues:  #1.  Chronic pancytopenia.  This is most likely reflective of her liver transplant, as well as medication related.  Overall her blood counts are relatively stable.  She's not reported any recent or recurrent infections.  She remains asymptomatic.  #2.  Anemia of chronic renal insufficiency.  Her hemoglobin is below 11 today, as such she will receive an Aranesp injection area  #3.  Autoimmune hepatitis.  She is status post a history of liver transplant.  She does have chronic pancytopenia, which is most likely reflective of the transplant.  #4.  Insomnia.  I did give her prescription for Restoril.  #5.  Followup.  We will follow back up with Ms. Stgelais in about one month but before then should there be questions or concerns.

## 2013-05-21 ENCOUNTER — Other Ambulatory Visit (HOSPITAL_BASED_OUTPATIENT_CLINIC_OR_DEPARTMENT_OTHER): Payer: Medicaid Other | Admitting: Lab

## 2013-05-21 ENCOUNTER — Ambulatory Visit (HOSPITAL_BASED_OUTPATIENT_CLINIC_OR_DEPARTMENT_OTHER): Payer: Medicaid Other | Admitting: Hematology & Oncology

## 2013-05-21 ENCOUNTER — Ambulatory Visit: Payer: Medicaid Other

## 2013-05-21 VITALS — BP 142/75 | HR 65 | Temp 98.1°F | Resp 16 | Ht 62.0 in | Wt 136.0 lb

## 2013-05-21 DIAGNOSIS — D631 Anemia in chronic kidney disease: Secondary | ICD-10-CM

## 2013-05-21 DIAGNOSIS — N039 Chronic nephritic syndrome with unspecified morphologic changes: Secondary | ICD-10-CM

## 2013-05-21 DIAGNOSIS — N189 Chronic kidney disease, unspecified: Secondary | ICD-10-CM

## 2013-05-21 LAB — COMPREHENSIVE METABOLIC PANEL
Albumin: 3.7 g/dL (ref 3.5–5.2)
Alkaline Phosphatase: 93 U/L (ref 39–117)
BUN: 29 mg/dL — ABNORMAL HIGH (ref 6–23)
Glucose, Bld: 119 mg/dL — ABNORMAL HIGH (ref 70–99)
Potassium: 4.9 mEq/L (ref 3.5–5.3)

## 2013-05-21 LAB — CBC WITH DIFFERENTIAL (CANCER CENTER ONLY)
BASO#: 0 10*3/uL (ref 0.0–0.2)
Eosinophils Absolute: 0 10*3/uL (ref 0.0–0.5)
LYMPH#: 0.5 10*3/uL — ABNORMAL LOW (ref 0.9–3.3)
LYMPH%: 27.7 % (ref 14.0–48.0)
MCHC: 33 g/dL (ref 32.0–36.0)
MONO#: 0.2 10*3/uL (ref 0.1–0.9)
MONO%: 12.7 % (ref 0.0–13.0)
Platelets: 112 10*3/uL — ABNORMAL LOW (ref 145–400)
RDW: 12.2 % (ref 11.1–15.7)
WBC: 1.7 10*3/uL — ABNORMAL LOW (ref 3.9–10.0)

## 2013-05-21 NOTE — Progress Notes (Signed)
This office note has been dictated.

## 2013-05-22 NOTE — Progress Notes (Signed)
CC:   Tami Cote, DO, [place] [phone]  DIAGNOSIS: 1. Pancytopenia-chronic. 2. Status post liver transplant. 3. Anemia secondary to renal insufficiency.  CURRENT THERAPY:  Aranesp 300 mcg subcu as needed for hemoglobin less than 11.  INTERIM HISTORY:  Tami [name] comes in for followup.  She is doing well. She is worried about some excess "skin" on her abdomen.  She says she has had this ever since she had a liver transplant.  She is wondering if anything can be done about this.  I told her that the only thing I could think of would be surgery.  Otherwise, she is doing well.  She always responds well to Aranesp.  She has had no bleeding.  There has been no change in bowel or bladder habits.  He has had no leg swelling.  There has been no rashes.  She has had no change in her medications.  PHYSICAL EXAMINATION:  General:  This is a petite African American female in no obvious distress.  Vital signs:  Temperature of 98.1, pulse 65, respiratory rate 16, blood pressure 142/75.  Weight is 136.  Head and neck:  Normocephalic, atraumatic skull.  There are no ocular or oral lesions.  There are no palpable cervical or supraclavicular lymph nodes. Lungs:  Clear bilaterally.  Cardiac:  Regular rate and rhythm with a normal S1 and S2.  There are no murmurs, rubs or bruits.  Abdomen: Soft.  She has good bowel sounds.  She has well healed laparotomy scars. There is no fluid wave.  There is no palpable hepatosplenomegaly. Extremities:  No clubbing, cyanosis or edema.  Neurological:  Shows no focal neurological deficits.  LABORATORY STUDIES:  White cell count is 1.7, hemoglobin 11.5, hematocrit 34.8, platelet count 112.  IMPRESSION:  Tami [name] is a very nice 57 year old African female with chronic pancytopenia.  Again, this might be related to her having the liver transplant.  She is asymptomatic with this even though she is leukopenic, she is really not at any higher risk for infection.   For now, we will plan to get her back monthly for her CBC and possible Aranesp. I will plan to get her back monthly for her CBC and possible Aranesp.  I will plan to see her back myself in about 3 months.    ______________________________ [name], M.D. PRE/MEDQ  D:  05/21/2013  T:  05/22/2013  Job:  [id number]

## 2013-05-28 DIAGNOSIS — M79609 Pain in unspecified limb: Secondary | ICD-10-CM | POA: Insufficient documentation

## 2013-06-20 ENCOUNTER — Ambulatory Visit: Payer: Medicaid Other | Admitting: Hematology & Oncology

## 2013-06-20 ENCOUNTER — Other Ambulatory Visit: Payer: Medicaid Other | Admitting: Lab

## 2013-06-21 ENCOUNTER — Ambulatory Visit (HOSPITAL_BASED_OUTPATIENT_CLINIC_OR_DEPARTMENT_OTHER): Payer: Medicaid Other

## 2013-06-21 ENCOUNTER — Ambulatory Visit (HOSPITAL_BASED_OUTPATIENT_CLINIC_OR_DEPARTMENT_OTHER): Payer: Medicaid Other | Admitting: Lab

## 2013-06-21 VITALS — BP 139/83 | HR 70 | Temp 97.3°F | Resp 18

## 2013-06-21 DIAGNOSIS — N189 Chronic kidney disease, unspecified: Secondary | ICD-10-CM

## 2013-06-21 DIAGNOSIS — D631 Anemia in chronic kidney disease: Secondary | ICD-10-CM

## 2013-06-21 LAB — CBC WITH DIFFERENTIAL (CANCER CENTER ONLY)
BASO#: 0 10*3/uL (ref 0.0–0.2)
Eosinophils Absolute: 0 10*3/uL (ref 0.0–0.5)
HGB: 9.6 g/dL — ABNORMAL LOW (ref 11.6–15.9)
LYMPH#: 0.4 10*3/uL — ABNORMAL LOW (ref 0.9–3.3)
MCH: 29.8 pg (ref 26.0–34.0)
MONO#: 0.3 10*3/uL (ref 0.1–0.9)
MONO%: 15.5 % — ABNORMAL HIGH (ref 0.0–13.0)
NEUT#: 1.2 10*3/uL — ABNORMAL LOW (ref 1.5–6.5)
RBC: 3.22 10*6/uL — ABNORMAL LOW (ref 3.70–5.32)

## 2013-06-21 MED ORDER — DARBEPOETIN ALFA-POLYSORBATE 300 MCG/0.6ML IJ SOLN
300.0000 ug | Freq: Once | INTRAMUSCULAR | Status: AC
  Administered 2013-06-21: 300 ug via SUBCUTANEOUS

## 2013-06-21 NOTE — Patient Instructions (Signed)
Darbepoetin Alfa injection What is this medicine? DARBEPOETIN ALFA (dar be POE e tin AL fa) helps your body make more red blood cells. It is used to treat anemia caused by chronic kidney failure and chemotherapy. This medicine may be used for other purposes; ask your health care provider or pharmacist if you have questions. What should I tell my health care provider before I take this medicine? They need to know if you have any of these conditions: -blood clotting disorders or history of blood clots -cancer patient not on chemotherapy -cystic fibrosis -heart disease, such as angina, heart failure, or a history of a heart attack -hemoglobin level of 12 g/dL or greater -high blood pressure -low levels of folate, iron, or vitamin B12 -seizures -an unusual or allergic reaction to darbepoetin, erythropoietin, albumin, hamster proteins, latex, other medicines, foods, dyes, or preservatives -pregnant or trying to get pregnant -breast-feeding How should I use this medicine? This medicine is for injection into a vein or under the skin. It is usually given by a health care professional in a hospital or clinic setting. If you get this medicine at home, you will be taught how to prepare and give this medicine. Do not shake the solution before you withdraw a dose. Use exactly as directed. Take your medicine at regular intervals. Do not take your medicine more often than directed. It is important that you put your used needles and syringes in a special sharps container. Do not put them in a trash can. If you do not have a sharps container, call your pharmacist or healthcare provider to get one. Talk to your pediatrician regarding the use of this medicine in children. While this medicine may be used in children as young as 1 year for selected conditions, precautions do apply. Overdosage: If you think you have taken too much of this medicine contact a poison control center or emergency room at once. NOTE:  This medicine is only for you. Do not share this medicine with others. What if I miss a dose? If you miss a dose, take it as soon as you can. If it is almost time for your next dose, take only that dose. Do not take double or extra doses. What may interact with this medicine? Do not take this medicine with any of the following medications: -epoetin alfa This list may not describe all possible interactions. Give your health care provider a list of all the medicines, herbs, non-prescription drugs, or dietary supplements you use. Also tell them if you smoke, drink alcohol, or use illegal drugs. Some items may interact with your medicine. What should I watch for while using this medicine? Visit your prescriber or health care professional for regular checks on your progress and for the needed blood tests and blood pressure measurements. It is especially important for the doctor to make sure your hemoglobin level is in the desired range, to limit the risk of potential side effects and to give you the best benefit. Keep all appointments for any recommended tests. Check your blood pressure as directed. Ask your doctor what your blood pressure should be and when you should contact him or her. As your body makes more red blood cells, you may need to take iron, folic acid, or vitamin B supplements. Ask your doctor or health care provider which products are right for you. If you have kidney disease continue dietary restrictions, even though this medication can make you feel better. Talk with your doctor or health care professional about the   foods you eat and the vitamins that you take. What side effects may I notice from receiving this medicine? Side effects that you should report to your doctor or health care professional as soon as possible: -allergic reactions like skin rash, itching or hives, swelling of the face, lips, or tongue -breathing problems -changes in vision -chest pain -confusion, trouble speaking  or understanding -feeling faint or lightheaded, falls -high blood pressure -muscle aches or pains -pain, swelling, warmth in the leg -rapid weight gain -severe headaches -sudden numbness or weakness of the face, arm or leg -trouble walking, dizziness, loss of balance or coordination -seizures (convulsions) -swelling of the ankles, feet, hands -unusually weak or tired Side effects that usually do not require medical attention (report to your doctor or health care professional if they continue or are bothersome): -diarrhea -fever, chills (flu-like symptoms) -headaches -nausea, vomiting -redness, stinging, or swelling at site where injected This list may not describe all possible side effects. Call your doctor for medical advice about side effects. You may report side effects to FDA at 1-800-FDA-1088. Where should I keep my medicine? Keep out of the reach of children. Store in a refrigerator between 2 and 8 degrees C (36 and 46 degrees F). Do not freeze. Do not shake. Throw away any unused portion if using a single-dose vial. Throw away any unused medicine after the expiration date. NOTE: This sheet is a summary. It may not cover all possible information. If you have questions about this medicine, talk to your doctor, pharmacist, or health care provider.  2013, [employer]. (09/23/2008 10:23:57 AM)

## 2013-07-23 ENCOUNTER — Other Ambulatory Visit (HOSPITAL_BASED_OUTPATIENT_CLINIC_OR_DEPARTMENT_OTHER): Payer: Medicaid Other | Admitting: Lab

## 2013-07-23 ENCOUNTER — Ambulatory Visit (HOSPITAL_BASED_OUTPATIENT_CLINIC_OR_DEPARTMENT_OTHER): Payer: Medicaid Other

## 2013-07-23 VITALS — BP 150/93 | HR 64 | Temp 97.0°F | Resp 18

## 2013-07-23 DIAGNOSIS — D631 Anemia in chronic kidney disease: Secondary | ICD-10-CM

## 2013-07-23 DIAGNOSIS — D638 Anemia in other chronic diseases classified elsewhere: Secondary | ICD-10-CM

## 2013-07-23 DIAGNOSIS — N189 Chronic kidney disease, unspecified: Secondary | ICD-10-CM

## 2013-07-23 LAB — CBC WITH DIFFERENTIAL (CANCER CENTER ONLY)
BASO%: 0 % (ref 0.0–2.0)
Eosinophils Absolute: 0 10*3/uL (ref 0.0–0.5)
HCT: 30.3 % — ABNORMAL LOW (ref 34.8–46.6)
LYMPH#: 0.4 10*3/uL — ABNORMAL LOW (ref 0.9–3.3)
MONO#: 0.3 10*3/uL (ref 0.1–0.9)
NEUT%: 59 % (ref 39.6–80.0)
RBC: 3.38 10*6/uL — ABNORMAL LOW (ref 3.70–5.32)
RDW: 13.5 % (ref 11.1–15.7)
WBC: 1.6 10*3/uL — ABNORMAL LOW (ref 3.9–10.0)

## 2013-07-23 LAB — RETICULOCYTES (CHCC)
ABS Retic: 23.4 10*3/uL (ref 19.0–186.0)
Retic Ct Pct: 0.7 % (ref 0.4–2.3)

## 2013-07-23 LAB — IRON AND TIBC CHCC
%SAT: 79 % — ABNORMAL HIGH (ref 21–57)
Iron: 133 ug/dL (ref 41–142)
UIBC: 36 ug/dL — ABNORMAL LOW (ref 120–384)

## 2013-07-23 MED ORDER — DARBEPOETIN ALFA-POLYSORBATE 300 MCG/0.6ML IJ SOLN
300.0000 ug | Freq: Once | INTRAMUSCULAR | Status: AC
  Administered 2013-07-23: 300 ug via SUBCUTANEOUS

## 2013-07-23 MED ORDER — DARBEPOETIN ALFA-POLYSORBATE 300 MCG/0.6ML IJ SOLN
INTRAMUSCULAR | Status: AC
  Filled 2013-07-23: qty 0.6

## 2013-07-23 NOTE — Patient Instructions (Signed)
Darbepoetin Alfa injection What is this medicine? DARBEPOETIN ALFA (dar be POE e tin AL fa) helps your body make more red blood cells. It is used to treat anemia caused by chronic kidney failure and chemotherapy. This medicine may be used for other purposes; ask your health care provider or pharmacist if you have questions. What should I tell my health care provider before I take this medicine? They need to know if you have any of these conditions: -blood clotting disorders or history of blood clots -cancer patient not on chemotherapy -cystic fibrosis -heart disease, such as angina, heart failure, or a history of a heart attack -hemoglobin level of 12 g/dL or greater -high blood pressure -low levels of folate, iron, or vitamin B12 -seizures -an unusual or allergic reaction to darbepoetin, erythropoietin, albumin, hamster proteins, latex, other medicines, foods, dyes, or preservatives -pregnant or trying to get pregnant -breast-feeding How should I use this medicine? This medicine is for injection into a vein or under the skin. It is usually given by a health care professional in a hospital or clinic setting. If you get this medicine at home, you will be taught how to prepare and give this medicine. Do not shake the solution before you withdraw a dose. Use exactly as directed. Take your medicine at regular intervals. Do not take your medicine more often than directed. It is important that you put your used needles and syringes in a special sharps container. Do not put them in a trash can. If you do not have a sharps container, call your pharmacist or healthcare provider to get one. Talk to your pediatrician regarding the use of this medicine in children. While this medicine may be used in children as young as 1 year for selected conditions, precautions do apply. Overdosage: If you think you have taken too much of this medicine contact a poison control center or emergency room at once. NOTE:  This medicine is only for you. Do not share this medicine with others. What if I miss a dose? If you miss a dose, take it as soon as you can. If it is almost time for your next dose, take only that dose. Do not take double or extra doses. What may interact with this medicine? Do not take this medicine with any of the following medications: -epoetin alfa This list may not describe all possible interactions. Give your health care provider a list of all the medicines, herbs, non-prescription drugs, or dietary supplements you use. Also tell them if you smoke, drink alcohol, or use illegal drugs. Some items may interact with your medicine. What should I watch for while using this medicine? Visit your prescriber or health care professional for regular checks on your progress and for the needed blood tests and blood pressure measurements. It is especially important for the doctor to make sure your hemoglobin level is in the desired range, to limit the risk of potential side effects and to give you the best benefit. Keep all appointments for any recommended tests. Check your blood pressure as directed. Ask your doctor what your blood pressure should be and when you should contact him or her. As your body makes more red blood cells, you may need to take iron, folic acid, or vitamin B supplements. Ask your doctor or health care provider which products are right for you. If you have kidney disease continue dietary restrictions, even though this medication can make you feel better. Talk with your doctor or health care professional about the   foods you eat and the vitamins that you take. What side effects may I notice from receiving this medicine? Side effects that you should report to your doctor or health care professional as soon as possible: -allergic reactions like skin rash, itching or hives, swelling of the face, lips, or tongue -breathing problems -changes in vision -chest pain -confusion, trouble speaking  or understanding -feeling faint or lightheaded, falls -high blood pressure -muscle aches or pains -pain, swelling, warmth in the leg -rapid weight gain -severe headaches -sudden numbness or weakness of the face, arm or leg -trouble walking, dizziness, loss of balance or coordination -seizures (convulsions) -swelling of the ankles, feet, hands -unusually weak or tired Side effects that usually do not require medical attention (report to your doctor or health care professional if they continue or are bothersome): -diarrhea -fever, chills (flu-like symptoms) -headaches -nausea, vomiting -redness, stinging, or swelling at site where injected This list may not describe all possible side effects. Call your doctor for medical advice about side effects. You may report side effects to FDA at 1-800-FDA-1088. Where should I keep my medicine? Keep out of the reach of children. Store in a refrigerator between 2 and 8 degrees C (36 and 46 degrees F). Do not freeze. Do not shake. Throw away any unused portion if using a single-dose vial. Throw away any unused medicine after the expiration date. NOTE: This sheet is a summary. It may not cover all possible information. If you have questions about this medicine, talk to your doctor, pharmacist, or health care provider.  2013, [employer]. (09/23/2008 10:23:57 AM)

## 2013-07-26 DIAGNOSIS — M546 Pain in thoracic spine: Secondary | ICD-10-CM | POA: Insufficient documentation

## 2013-07-26 DIAGNOSIS — R52 Pain, unspecified: Secondary | ICD-10-CM | POA: Insufficient documentation

## 2013-08-13 ENCOUNTER — Encounter: Payer: Self-pay | Admitting: Physical Medicine & Rehabilitation

## 2013-08-20 ENCOUNTER — Ambulatory Visit: Payer: Medicaid Other

## 2013-08-20 ENCOUNTER — Other Ambulatory Visit: Payer: Medicaid Other | Admitting: Lab

## 2013-08-20 ENCOUNTER — Ambulatory Visit: Payer: Medicaid Other | Admitting: Hematology & Oncology

## 2013-08-20 ENCOUNTER — Telehealth: Payer: Self-pay | Admitting: Hematology & Oncology

## 2013-08-20 NOTE — Telephone Encounter (Signed)
Patient called and cx 08/20/13 apt and resch for 08/26/13

## 2013-08-26 ENCOUNTER — Other Ambulatory Visit (HOSPITAL_BASED_OUTPATIENT_CLINIC_OR_DEPARTMENT_OTHER): Payer: Medicaid Other | Admitting: Lab

## 2013-08-26 ENCOUNTER — Ambulatory Visit: Payer: Medicaid Other

## 2013-08-26 ENCOUNTER — Ambulatory Visit (HOSPITAL_BASED_OUTPATIENT_CLINIC_OR_DEPARTMENT_OTHER): Payer: Medicaid Other | Admitting: Hematology & Oncology

## 2013-08-26 VITALS — BP 134/79 | HR 78 | Temp 99.0°F | Resp 14 | Ht 62.0 in | Wt 138.0 lb

## 2013-08-26 DIAGNOSIS — D631 Anemia in chronic kidney disease: Secondary | ICD-10-CM

## 2013-08-26 DIAGNOSIS — D61818 Other pancytopenia: Secondary | ICD-10-CM

## 2013-08-26 DIAGNOSIS — G4701 Insomnia due to medical condition: Secondary | ICD-10-CM

## 2013-08-26 LAB — CBC WITH DIFFERENTIAL (CANCER CENTER ONLY)
BASO%: 0 % (ref 0.0–2.0)
Eosinophils Absolute: 0 10*3/uL (ref 0.0–0.5)
HGB: 10.2 g/dL — ABNORMAL LOW (ref 11.6–15.9)
LYMPH#: 0.5 10*3/uL — ABNORMAL LOW (ref 0.9–3.3)
LYMPH%: 19.9 % (ref 14.0–48.0)
MCH: 29.5 pg (ref 26.0–34.0)
MONO%: 15.7 % — ABNORMAL HIGH (ref 0.0–13.0)
NEUT#: 1.5 10*3/uL (ref 1.5–6.5)
Platelets: 129 10*3/uL — ABNORMAL LOW (ref 145–400)
RBC: 3.46 10*6/uL — ABNORMAL LOW (ref 3.70–5.32)
WBC: 2.4 10*3/uL — ABNORMAL LOW (ref 3.9–10.0)

## 2013-08-26 MED ORDER — DARBEPOETIN ALFA-POLYSORBATE 300 MCG/0.6ML IJ SOLN
INTRAMUSCULAR | Status: AC
  Filled 2013-08-26: qty 0.6

## 2013-08-26 MED ORDER — TRAZODONE HCL 50 MG PO TABS: 150.0000 mg | ORAL_TABLET | Freq: Every day | ORAL | Status: DC

## 2013-08-26 NOTE — Progress Notes (Signed)
This office note has been dictated.

## 2013-08-26 NOTE — Progress Notes (Signed)
Per Dr. Marin Olp no injection needed today.

## 2013-08-28 NOTE — Progress Notes (Signed)
CC:   Kathlene Cote, DO, Fax [phone]  DIAGNOSES: 1. Chronic pancytopenia. 2. Status post liver transplant. 3. Renal insufficiency with anemia.  CURRENT THERAPY:  Aranesp 300 mcg subcu as needed for hemoglobin less than 10.  INTERIM HISTORY:  Tami [name] comes in for a followup.  She is doing fairly well.  She is not happy with the sleep medicine that she is taking.  She is on Restoril.  She said this is too strong for her.  I will see about switching her over to maybe trazodone.  She is having some pain issues.  She sees a pain doctor, but she is not happy with him.  I told her that we could not manage her pain needs because she does not have cancer.  I told her she could probably go back to [facility] and they could help her out.  She continues on cyclosporine for the liver transplant.  She has had no bleeding.  She has had no fever.  She has had no cough or shortness of breath.  She has had no nausea and vomiting.  When I last saw her in September, her ferritin was 591, with an iron saturation of 79%.  PHYSICAL EXAMINATION:  General:  This is a petite black female, in no obvious distress.  Vital signs:  Show a temperature of 99, pulse 78, respiratory rate 14, blood pressure 134/79, weight is 138 pounds.  Head and Neck:  Normocephalic, atraumatic skull.  There are no ocular or oral lesions.  There are no palpable cervical or supraclavicular lymph nodes. Lungs:  Clear bilaterally.  Cardiac:  Regular rate and rhythm with a normal S1, S2.  She has a 1/6 systolic ejection murmur.  Abdomen:  Soft. She has well-healed laparotomy scars.  There is no fluid wave.  There is no palpable hepatosplenomegaly.  Extremities:  No clubbing, cyanosis or edema.  Neurologic:  No focal neurological deficits.  LABORATORY STUDIES:  White cell count is 2.4, hemoglobin 10.2, hematocrit 31.8, platelet count 129.  MCV is 92.  IMPRESSION:  Tami [name] is a very charming 57 year old African  American female.  She has had a history of a liver transplant.  She has done well with this.  So far, there has been no evidence of any kind of rejection.  We will go ahead and plan to have her come back monthly as always.  I think we can get her criteria for Aranesp down to hemoglobin below 10. She functions pretty well above 10, so I think we can have some flexibility.  I will see her back myself in another 3 months.    ______________________________ Tami [name], M.D. PRE/MEDQ  D:  08/26/2013  T:  08/27/2013  Job:  [id number]

## 2013-08-29 ENCOUNTER — Other Ambulatory Visit: Payer: Self-pay

## 2013-09-13 ENCOUNTER — Encounter: Payer: Medicaid Other | Attending: Physical Medicine & Rehabilitation

## 2013-09-13 ENCOUNTER — Encounter: Payer: Self-pay | Admitting: Physical Medicine & Rehabilitation

## 2013-09-13 ENCOUNTER — Ambulatory Visit (HOSPITAL_BASED_OUTPATIENT_CLINIC_OR_DEPARTMENT_OTHER): Payer: Medicaid Other | Admitting: Physical Medicine & Rehabilitation

## 2013-09-13 VITALS — BP 143/72 | HR 87 | Resp 14 | Ht 60.0 in | Wt 143.0 lb

## 2013-09-13 DIAGNOSIS — M545 Low back pain, unspecified: Secondary | ICD-10-CM | POA: Insufficient documentation

## 2013-09-13 DIAGNOSIS — M1710 Unilateral primary osteoarthritis, unspecified knee: Secondary | ICD-10-CM

## 2013-09-13 DIAGNOSIS — Z79899 Other long term (current) drug therapy: Secondary | ICD-10-CM

## 2013-09-13 DIAGNOSIS — G8929 Other chronic pain: Secondary | ICD-10-CM

## 2013-09-13 DIAGNOSIS — M1711 Unilateral primary osteoarthritis, right knee: Secondary | ICD-10-CM | POA: Insufficient documentation

## 2013-09-13 DIAGNOSIS — M549 Dorsalgia, unspecified: Secondary | ICD-10-CM

## 2013-09-13 DIAGNOSIS — Z5181 Encounter for therapeutic drug level monitoring: Secondary | ICD-10-CM

## 2013-09-13 MED ORDER — DICLOFENAC SODIUM 1 % TD GEL: 2.0000 g | Freq: Four times a day (QID) | TRANSDERMAL | Status: DC

## 2013-09-13 NOTE — Progress Notes (Signed)
Subjective:    Patient ID: Tami [name], female    DOB: 1955-11-21, 57 y.o.   MRN: [id number]  HPI Chief complaint is low back pain secondary complaint is right knee pain  Has been seen at the [facility] regional physical medicine and rehabilitation clinic. Has been on chronic oxycodone 15 mg 4 times per day. History of osteoporosis as well as vertebral compression fractures in the lower thoracic spine. Imaging studies not available to me. Had a rheumatologist try some knee injections which were not helpful for her.  Has had epidural injections when she lived in [place]. These were done with sedation. She woke up from sedation with nausea and vomiting. She did not hear these injections were helpful for her.  Past medical history is significant for liver transplant. Has anemia and requires Aranesp infusion Pain Inventory Average Pain 8 Pain Right Now 6 My pain is aching  In the last 24 hours, has pain interfered with the following? General activity 7 Relation with others 0 Enjoyment of life 8 What TIME of day is your pain at its worst? night Sleep (in general) Fair  Pain is worse with: bending and some activites Pain improves with: rest and medication Relief from Meds: 8  Mobility walk without assistance how many minutes can you walk? 35-45 ability to climb steps?  yes do you drive?  yes Do you have any goals in this area?  no  Function disabled: date disabled 7/96 Do you have any goals in this area?  no  Neuro/Psych No problems in this area  Prior Studies Any changes since last visit?  no  Physicians involved in your care Dr Gery Pray,    Family History  Problem Relation Age of Onset  . Stroke Mother   . Cancer Father    History   Social History  . Marital Status: Single    Spouse Name: N/A    Number of Children: N/A  . Years of Education: N/A   Social History Main Topics  . Smoking status: Never Smoker   . Smokeless tobacco: None  . Alcohol Use: Yes   . Drug Use: No  . Sexual Activity:    Other Topics Concern  . None   Social History Narrative  . None   Past Surgical History  Procedure Laterality Date  . Liver transplant      1996  . Tonsillectomy    . Abdominal hysterectomy     Past Medical History  Diagnosis Date  . Hypertension   . 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  All other systems reviewed and are negative.       Objective:   Physical Exam  Nursing note and vitals reviewed. Constitutional: She is oriented to person, place, and time. She appears well-developed and well-nourished.  HENT:  Head: Normocephalic and atraumatic.  Eyes: Conjunctivae and EOM are normal. Pupils are equal, round, and reactive to light.  Neck: Normal range of motion.  Cardiovascular: Normal rate, regular rhythm and normal heart sounds.   Pulmonary/Chest: Effort normal and breath sounds normal.  Abdominal: Soft. Bowel sounds are normal.  Neurological: She is alert and oriented to person, place, and time. She has normal reflexes.  Psychiatric: She has a normal mood and affect.  Assessment & Plan:   1. Chronic right knee osteoarthritis injections are not helpful. Not considering surgery yet. We'll continue narcotic analgesics if her urine drug screen looks okay Will also need to score her opioid risks tool  #2. Chronic low back pain has tried epidural injections, this may be spondylosis and may benefit from medial branch blocks however patient does not seem to be anxious about any type of injections  RTC 3 weeks, she has enough medicine until that time  May need some further imaging should her symptoms progress

## 2013-09-13 NOTE — Patient Instructions (Signed)
Apply Voltaren gel to right knee at bedtime

## 2013-09-23 ENCOUNTER — Ambulatory Visit (HOSPITAL_BASED_OUTPATIENT_CLINIC_OR_DEPARTMENT_OTHER): Payer: Medicaid Other

## 2013-09-23 ENCOUNTER — Other Ambulatory Visit (HOSPITAL_BASED_OUTPATIENT_CLINIC_OR_DEPARTMENT_OTHER): Payer: Medicaid Other | Admitting: Lab

## 2013-09-23 VITALS — BP 122/79 | HR 78 | Temp 97.9°F | Resp 18

## 2013-09-23 DIAGNOSIS — D631 Anemia in chronic kidney disease: Secondary | ICD-10-CM

## 2013-09-23 DIAGNOSIS — N189 Chronic kidney disease, unspecified: Secondary | ICD-10-CM

## 2013-09-23 LAB — CBC WITH DIFFERENTIAL (CANCER CENTER ONLY)
BASO#: 0 10*3/uL (ref 0.0–0.2)
BASO%: 0.4 % (ref 0.0–2.0)
Eosinophils Absolute: 0.1 10*3/uL (ref 0.0–0.5)
HCT: 26.1 % — ABNORMAL LOW (ref 34.8–46.6)
HGB: 8.4 g/dL — ABNORMAL LOW (ref 11.6–15.9)
LYMPH%: 20.7 % (ref 14.0–48.0)
MCV: 92 fL (ref 81–101)
MONO#: 0.3 10*3/uL (ref 0.1–0.9)
Platelets: 143 10*3/uL — ABNORMAL LOW (ref 145–400)
RBC: 2.84 10*6/uL — ABNORMAL LOW (ref 3.70–5.32)
WBC: 2.3 10*3/uL — ABNORMAL LOW (ref 3.9–10.0)

## 2013-09-23 MED ORDER — DARBEPOETIN ALFA-POLYSORBATE 300 MCG/0.6ML IJ SOLN
INTRAMUSCULAR | Status: AC
  Filled 2013-09-23: qty 0.6

## 2013-09-23 MED ORDER — DARBEPOETIN ALFA-POLYSORBATE 300 MCG/0.6ML IJ SOLN
300.0000 ug | Freq: Once | INTRAMUSCULAR | Status: AC
  Administered 2013-09-23: 300 ug via SUBCUTANEOUS

## 2013-09-23 NOTE — Patient Instructions (Signed)
Darbepoetin Alfa injection What is this medicine? DARBEPOETIN ALFA (dar be POE e tin AL fa) helps your body make more red blood cells. It is used to treat anemia caused by chronic kidney failure and chemotherapy. This medicine may be used for other purposes; ask your health care provider or pharmacist if you have questions. COMMON BRAND NAME(S): Aranesp What should I tell my health care provider before I take this medicine? They need to know if you have any of these conditions: -blood clotting disorders or history of blood clots -cancer patient not on chemotherapy -cystic fibrosis -heart disease, such as angina, heart failure, or a history of a heart attack -hemoglobin level of 12 g/dL or greater -high blood pressure -low levels of folate, iron, or vitamin B12 -seizures -an unusual or allergic reaction to darbepoetin, erythropoietin, albumin, hamster proteins, latex, other medicines, foods, dyes, or preservatives -pregnant or trying to get pregnant -breast-feeding How should I use this medicine? This medicine is for injection into a vein or under the skin. It is usually given by a health care professional in a hospital or clinic setting. If you get this medicine at home, you will be taught how to prepare and give this medicine. Do not shake the solution before you withdraw a dose. Use exactly as directed. Take your medicine at regular intervals. Do not take your medicine more often than directed. It is important that you put your used needles and syringes in a special sharps container. Do not put them in a trash can. If you do not have a sharps container, call your pharmacist or healthcare provider to get one. Talk to your pediatrician regarding the use of this medicine in children. While this medicine may be used in children as young as 1 year for selected conditions, precautions do apply. Overdosage: If you think you have taken too much of this medicine contact a poison control center or  emergency room at once. NOTE: This medicine is only for you. Do not share this medicine with others. What if I miss a dose? If you miss a dose, take it as soon as you can. If it is almost time for your next dose, take only that dose. Do not take double or extra doses. What may interact with this medicine? Do not take this medicine with any of the following medications: -epoetin alfa This list may not describe all possible interactions. Give your health care provider a list of all the medicines, herbs, non-prescription drugs, or dietary supplements you use. Also tell them if you smoke, drink alcohol, or use illegal drugs. Some items may interact with your medicine. What should I watch for while using this medicine? Visit your prescriber or health care professional for regular checks on your progress and for the needed blood tests and blood pressure measurements. It is especially important for the doctor to make sure your hemoglobin level is in the desired range, to limit the risk of potential side effects and to give you the best benefit. Keep all appointments for any recommended tests. Check your blood pressure as directed. Ask your doctor what your blood pressure should be and when you should contact him or her. As your body makes more red blood cells, you may need to take iron, folic acid, or vitamin B supplements. Ask your doctor or health care provider which products are right for you. If you have kidney disease continue dietary restrictions, even though this medication can make you feel better. Talk with your doctor or health   care professional about the foods you eat and the vitamins that you take. What side effects may I notice from receiving this medicine? Side effects that you should report to your doctor or health care professional as soon as possible: -allergic reactions like skin rash, itching or hives, swelling of the face, lips, or tongue -breathing problems -changes in vision -chest  pain -confusion, trouble speaking or understanding -feeling faint or lightheaded, falls -high blood pressure -muscle aches or pains -pain, swelling, warmth in the leg -rapid weight gain -severe headaches -sudden numbness or weakness of the face, arm or leg -trouble walking, dizziness, loss of balance or coordination -seizures (convulsions) -swelling of the ankles, feet, hands -unusually weak or tired Side effects that usually do not require medical attention (report to your doctor or health care professional if they continue or are bothersome): -diarrhea -fever, chills (flu-like symptoms) -headaches -nausea, vomiting -redness, stinging, or swelling at site where injected This list may not describe all possible side effects. Call your doctor for medical advice about side effects. You may report side effects to FDA at 1-800-FDA-1088. Where should I keep my medicine? Keep out of the reach of children. Store in a refrigerator between 2 and 8 degrees C (36 and 46 degrees F). Do not freeze. Do not shake. Throw away any unused portion if using a single-dose vial. Throw away any unused medicine after the expiration date. NOTE: This sheet is a summary. It may not cover all possible information. If you have questions about this medicine, talk to your doctor, pharmacist, or health care provider.  2014, [employer]. (2008-09-23 10:23:57)  

## 2013-10-04 ENCOUNTER — Ambulatory Visit: Payer: Medicaid Other | Admitting: Physical Medicine & Rehabilitation

## 2013-10-28 ENCOUNTER — Other Ambulatory Visit (HOSPITAL_BASED_OUTPATIENT_CLINIC_OR_DEPARTMENT_OTHER): Payer: Medicaid Other | Admitting: Lab

## 2013-10-28 ENCOUNTER — Ambulatory Visit: Payer: Medicaid Other

## 2013-10-28 DIAGNOSIS — N189 Chronic kidney disease, unspecified: Secondary | ICD-10-CM

## 2013-10-28 DIAGNOSIS — D631 Anemia in chronic kidney disease: Secondary | ICD-10-CM

## 2013-10-28 DIAGNOSIS — N039 Chronic nephritic syndrome with unspecified morphologic changes: Secondary | ICD-10-CM

## 2013-10-28 LAB — CBC WITH DIFFERENTIAL (CANCER CENTER ONLY)
BASO#: 0 10*3/uL (ref 0.0–0.2)
BASO%: 0 % (ref 0.0–2.0)
EOS ABS: 0.1 10*3/uL (ref 0.0–0.5)
EOS%: 2.6 % (ref 0.0–7.0)
HCT: 33.9 % — ABNORMAL LOW (ref 34.8–46.6)
HGB: 10.9 g/dL — ABNORMAL LOW (ref 11.6–15.9)
LYMPH#: 0.5 10*3/uL — AB (ref 0.9–3.3)
LYMPH%: 28 % (ref 14.0–48.0)
MCH: 29.4 pg (ref 26.0–34.0)
MCHC: 32.2 g/dL (ref 32.0–36.0)
MCV: 91 fL (ref 81–101)
MONO#: 0.3 10*3/uL (ref 0.1–0.9)
MONO%: 14 % — ABNORMAL HIGH (ref 0.0–13.0)
NEUT%: 55.4 % (ref 39.6–80.0)
NEUTROS ABS: 1.1 10*3/uL — AB (ref 1.5–6.5)
Platelets: 125 10*3/uL — ABNORMAL LOW (ref 145–400)
RBC: 3.71 10*6/uL (ref 3.70–5.32)
RDW: 12.7 % (ref 11.1–15.7)
WBC: 1.9 10*3/uL — ABNORMAL LOW (ref 3.9–10.0)

## 2013-11-25 ENCOUNTER — Encounter: Payer: Self-pay | Admitting: Hematology & Oncology

## 2013-11-25 ENCOUNTER — Other Ambulatory Visit (HOSPITAL_BASED_OUTPATIENT_CLINIC_OR_DEPARTMENT_OTHER): Payer: Medicaid Other | Admitting: Lab

## 2013-11-25 ENCOUNTER — Ambulatory Visit (HOSPITAL_BASED_OUTPATIENT_CLINIC_OR_DEPARTMENT_OTHER): Payer: Medicaid Other | Admitting: Hematology & Oncology

## 2013-11-25 ENCOUNTER — Ambulatory Visit (HOSPITAL_BASED_OUTPATIENT_CLINIC_OR_DEPARTMENT_OTHER): Payer: Medicaid Other

## 2013-11-25 VITALS — BP 153/73 | HR 78 | Temp 98.0°F | Resp 14 | Ht 60.0 in | Wt 143.0 lb

## 2013-11-25 DIAGNOSIS — N039 Chronic nephritic syndrome with unspecified morphologic changes: Secondary | ICD-10-CM

## 2013-11-25 DIAGNOSIS — D631 Anemia in chronic kidney disease: Secondary | ICD-10-CM

## 2013-11-25 DIAGNOSIS — K754 Autoimmune hepatitis: Secondary | ICD-10-CM

## 2013-11-25 DIAGNOSIS — N189 Chronic kidney disease, unspecified: Secondary | ICD-10-CM

## 2013-11-25 DIAGNOSIS — D61818 Other pancytopenia: Secondary | ICD-10-CM

## 2013-11-25 DIAGNOSIS — G4701 Insomnia due to medical condition: Secondary | ICD-10-CM

## 2013-11-25 LAB — RETICULOCYTES (CHCC)
ABS Retic: 33.2 10*3/uL (ref 19.0–186.0)
RBC.: 3.02 MIL/uL — ABNORMAL LOW (ref 3.87–5.11)
RETIC CT PCT: 1.1 % (ref 0.4–2.3)

## 2013-11-25 LAB — COMPREHENSIVE METABOLIC PANEL
ALK PHOS: 64 U/L (ref 39–117)
ALT: <8 U/L (ref 0–35)
AST: 15 U/L (ref 0–37)
Albumin: 3.6 g/dL (ref 3.5–5.2)
BILIRUBIN TOTAL: 0.5 mg/dL (ref 0.2–1.2)
BUN: 30 mg/dL — ABNORMAL HIGH (ref 6–23)
CO2: 22 meq/L (ref 19–32)
Calcium: 8.8 mg/dL (ref 8.4–10.5)
Chloride: 106 mEq/L (ref 96–112)
Creatinine, Ser: 1.31 mg/dL — ABNORMAL HIGH (ref 0.50–1.10)
Glucose, Bld: 77 mg/dL (ref 70–99)
Potassium: 4.9 mEq/L (ref 3.5–5.3)
SODIUM: 137 meq/L (ref 135–145)
TOTAL PROTEIN: 7.9 g/dL (ref 6.0–8.3)

## 2013-11-25 LAB — CBC WITH DIFFERENTIAL (CANCER CENTER ONLY)
BASO#: 0 10*3/uL (ref 0.0–0.2)
BASO%: 0 % (ref 0.0–2.0)
EOS ABS: 0.1 10*3/uL (ref 0.0–0.5)
EOS%: 1.8 % (ref 0.0–7.0)
HCT: 26.9 % — ABNORMAL LOW (ref 34.8–46.6)
HGB: 8.8 g/dL — ABNORMAL LOW (ref 11.6–15.9)
LYMPH#: 0.6 10*3/uL — ABNORMAL LOW (ref 0.9–3.3)
LYMPH%: 20.8 % (ref 14.0–48.0)
MCH: 29.9 pg (ref 26.0–34.0)
MCHC: 32.7 g/dL (ref 32.0–36.0)
MCV: 92 fL (ref 81–101)
MONO#: 0.3 10*3/uL (ref 0.1–0.9)
MONO%: 9.2 % (ref 0.0–13.0)
NEUT#: 1.9 10*3/uL (ref 1.5–6.5)
NEUT%: 68.2 % (ref 39.6–80.0)
Platelets: 131 10*3/uL — ABNORMAL LOW (ref 145–400)
RBC: 2.94 10*6/uL — AB (ref 3.70–5.32)
RDW: 12.6 % (ref 11.1–15.7)
WBC: 2.8 10*3/uL — AB (ref 3.9–10.0)

## 2013-11-25 LAB — IRON AND TIBC CHCC
%SAT: 54 % (ref 21–57)
Iron: 90 ug/dL (ref 41–142)
TIBC: 165 ug/dL — AB (ref 236–444)
UIBC: 75 ug/dL — ABNORMAL LOW (ref 120–384)

## 2013-11-25 LAB — FERRITIN CHCC: FERRITIN: 844 ng/mL — AB (ref 9–269)

## 2013-11-25 MED ORDER — DARBEPOETIN ALFA-POLYSORBATE 300 MCG/0.6ML IJ SOLN
INTRAMUSCULAR | Status: AC
  Filled 2013-11-25: qty 0.6

## 2013-11-25 MED ORDER — DARBEPOETIN ALFA-POLYSORBATE 300 MCG/0.6ML IJ SOLN
300.0000 ug | Freq: Once | INTRAMUSCULAR | Status: AC
  Administered 2013-11-25: 300 ug via SUBCUTANEOUS

## 2013-11-25 NOTE — Progress Notes (Signed)
This office note has been dictated.

## 2013-11-27 NOTE — Progress Notes (Signed)
CC:   Kathlene Cote, DO  DIAGNOSES: 1. Chronic pancytopenia. 2. Status post liver transplant for autoimmune hepatitis. 3. Renal insufficiency with anemia.  CURRENT THERAPY:  Aranesp 300 mcg subcu as needed for hemoglobin less than 10.  INTERIM HISTORY:  [name] comes in for followup.  She is doing fairly well.  She is worried that her potassium has been "high."  She says that she is called by [facility].  She says that they told her that her potassium is high.  We have not checked her potassium here since July.  Her potassium at that point was 4.9.  She has chronic renal insufficiency.  She does see a nephrologist.  I am not sure exactly what the issue has been.  She is eating okay.  She is worried about what she cannot eat.  She says that she has a list of things that she cannot eat.  She is on cyclosporine.  She does have renal insufficiency.  Again, she does see a Industrial/product designer.  She is on hydrochlorothiazide.  I do not see that she is on any type of medicine that would increase potassium absorption.  She has had no fever.  She has had no nausea or vomiting.  She has had no cough.  PHYSICAL EXAMINATION:  General:  This is a petite African American female in no obvious distress.  Vital Signs:  Temperature of 98, pulse 78, respiratory rate 14, blood pressure 153/73, and weight is 143 pounds.  Head and Neck:  Normocephalic and atraumatic skull.  There are no ocular or oral lesions.  There are no palpable cervical or supraclavicular lymph nodes.  Lungs:  Clear bilaterally.  Cardiac: Regular rate and rhythm with a normal S1 and S2.  There are no murmurs, rubs, or bruits.  Abdomen:  Soft.  She has good bowel sounds.  There is no fluid wave.  There is a well-healed laparotomy scar.  There is no palpable hepatosplenomegaly.  Back:  No tenderness over the spine, ribs, or hips.  Extremities:  No clubbing, cyanosis, or edema.  Neurological: No focal neurological  deficits.  LABORATORY STUDIES:  White cell count is 2.8, hemoglobin 8.8, hematocrit 26.9, platelet count 131.  MCV is 92.  IMPRESSION:  [name] is a very charming 58 year old African American female.  She has history of autoimmune hepatitis.  She underwent a liver transplant for this.  The transplant was done at [facility].  She has chronic pancytopenia.  This is stable.  I do not see that we really need to get too involved with her blood counts right now.  I am not sure what her potassium issue is.  We will go ahead and give her Aranesp today.  We either have her continue to come back monthly to have her blood work checked and also have her possible Aranesp injection given.  I will plan to see her back myself in about 3 months.    ______________________________ [name], M.D. PRE/MEDQ  D:  11/25/2013  T:  11/26/2013  Job:  [id number]

## 2013-12-23 ENCOUNTER — Telehealth: Payer: Self-pay | Admitting: Hematology & Oncology

## 2013-12-23 ENCOUNTER — Ambulatory Visit: Payer: Medicaid Other

## 2013-12-23 ENCOUNTER — Other Ambulatory Visit: Payer: Medicaid Other | Admitting: Lab

## 2013-12-23 NOTE — Telephone Encounter (Signed)
Patient called to cancel today and will cb to reschedule

## 2013-12-25 ENCOUNTER — Telehealth: Payer: Self-pay | Admitting: Hematology & Oncology

## 2013-12-25 NOTE — Telephone Encounter (Signed)
Pt made 3-5 appointment from 3-2

## 2013-12-26 ENCOUNTER — Ambulatory Visit: Payer: Medicaid Other

## 2013-12-26 ENCOUNTER — Other Ambulatory Visit: Payer: Self-pay | Admitting: Oncology

## 2013-12-26 ENCOUNTER — Other Ambulatory Visit (HOSPITAL_BASED_OUTPATIENT_CLINIC_OR_DEPARTMENT_OTHER): Payer: Medicaid Other | Admitting: Lab

## 2013-12-26 DIAGNOSIS — N039 Chronic nephritic syndrome with unspecified morphologic changes: Secondary | ICD-10-CM

## 2013-12-26 DIAGNOSIS — D631 Anemia in chronic kidney disease: Secondary | ICD-10-CM

## 2013-12-26 DIAGNOSIS — N189 Chronic kidney disease, unspecified: Principal | ICD-10-CM

## 2013-12-26 LAB — CBC WITH DIFFERENTIAL (CANCER CENTER ONLY)
BASO#: 0 10*3/uL (ref 0.0–0.2)
BASO%: 0 % (ref 0.0–2.0)
EOS ABS: 0 10*3/uL (ref 0.0–0.5)
EOS%: 2.3 % (ref 0.0–7.0)
HEMATOCRIT: 34 % — AB (ref 34.8–46.6)
HEMOGLOBIN: 11 g/dL — AB (ref 11.6–15.9)
LYMPH#: 0.4 10*3/uL — AB (ref 0.9–3.3)
LYMPH%: 23.8 % (ref 14.0–48.0)
MCH: 29.7 pg (ref 26.0–34.0)
MCHC: 32.4 g/dL (ref 32.0–36.0)
MCV: 92 fL (ref 81–101)
MONO#: 0.3 10*3/uL (ref 0.1–0.9)
MONO%: 14.5 % — AB (ref 0.0–13.0)
NEUT#: 1 10*3/uL — ABNORMAL LOW (ref 1.5–6.5)
NEUT%: 59.4 % (ref 39.6–80.0)
Platelets: 115 10*3/uL — ABNORMAL LOW (ref 145–400)
RBC: 3.7 10*6/uL (ref 3.70–5.32)
RDW: 12.3 % (ref 11.1–15.7)
WBC: 1.7 10*3/uL — ABNORMAL LOW (ref 3.9–10.0)

## 2013-12-26 MED ORDER — ZOLPIDEM TARTRATE 5 MG PO TABS: 5.0000 mg | ORAL_TABLET | Freq: Every evening | ORAL | Status: DC | PRN

## 2013-12-26 NOTE — Progress Notes (Signed)
Patient comes in today, CBC checked, Aranasp injection not given due to parameters.  Hgb was 11 .  Patient made aware and copy of lab work given.

## 2013-12-26 NOTE — Telephone Encounter (Signed)
Pt requests sleep aid, Dr. Marin Olp notified, Rx given to patient.

## 2014-01-02 DIAGNOSIS — M533 Sacrococcygeal disorders, not elsewhere classified: Secondary | ICD-10-CM | POA: Insufficient documentation

## 2014-01-02 DIAGNOSIS — N186 End stage renal disease: Secondary | ICD-10-CM

## 2014-01-02 DIAGNOSIS — M5137 Other intervertebral disc degeneration, lumbosacral region: Secondary | ICD-10-CM | POA: Insufficient documentation

## 2014-01-02 DIAGNOSIS — M5414 Radiculopathy, thoracic region: Secondary | ICD-10-CM | POA: Insufficient documentation

## 2014-01-02 DIAGNOSIS — I10 Essential (primary) hypertension: Secondary | ICD-10-CM | POA: Insufficient documentation

## 2014-01-02 DIAGNOSIS — E1122 Type 2 diabetes mellitus with diabetic chronic kidney disease: Secondary | ICD-10-CM | POA: Insufficient documentation

## 2014-01-02 DIAGNOSIS — M539 Dorsopathy, unspecified: Secondary | ICD-10-CM | POA: Insufficient documentation

## 2014-01-02 DIAGNOSIS — M5417 Radiculopathy, lumbosacral region: Secondary | ICD-10-CM

## 2014-01-27 ENCOUNTER — Ambulatory Visit: Payer: Medicaid Other

## 2014-01-27 ENCOUNTER — Other Ambulatory Visit: Payer: Medicaid Other | Admitting: Lab

## 2014-01-28 ENCOUNTER — Telehealth: Payer: Self-pay | Admitting: Hematology & Oncology

## 2014-01-28 NOTE — Telephone Encounter (Signed)
Patient called and resch 01/27/14 missed apt for 01/30/14

## 2014-01-30 ENCOUNTER — Ambulatory Visit: Payer: Medicaid Other

## 2014-01-30 ENCOUNTER — Other Ambulatory Visit (HOSPITAL_BASED_OUTPATIENT_CLINIC_OR_DEPARTMENT_OTHER): Payer: Medicaid Other | Admitting: Lab

## 2014-01-30 DIAGNOSIS — N039 Chronic nephritic syndrome with unspecified morphologic changes: Secondary | ICD-10-CM

## 2014-01-30 DIAGNOSIS — N189 Chronic kidney disease, unspecified: Secondary | ICD-10-CM

## 2014-01-30 DIAGNOSIS — D631 Anemia in chronic kidney disease: Secondary | ICD-10-CM

## 2014-01-30 LAB — CBC WITH DIFFERENTIAL (CANCER CENTER ONLY)
BASO#: 0 10*3/uL (ref 0.0–0.2)
BASO%: 0 % (ref 0.0–2.0)
EOS%: 1.8 % (ref 0.0–7.0)
Eosinophils Absolute: 0 10*3/uL (ref 0.0–0.5)
HCT: 30.2 % — ABNORMAL LOW (ref 34.8–46.6)
HEMOGLOBIN: 10.1 g/dL — AB (ref 11.6–15.9)
LYMPH#: 0.5 10*3/uL — ABNORMAL LOW (ref 0.9–3.3)
LYMPH%: 20.8 % (ref 14.0–48.0)
MCH: 30.4 pg (ref 26.0–34.0)
MCHC: 33.4 g/dL (ref 32.0–36.0)
MCV: 91 fL (ref 81–101)
MONO#: 0.2 10*3/uL (ref 0.1–0.9)
MONO%: 9.5 % (ref 0.0–13.0)
NEUT%: 67.9 % (ref 39.6–80.0)
NEUTROS ABS: 1.5 10*3/uL (ref 1.5–6.5)
Platelets: 113 10*3/uL — ABNORMAL LOW (ref 145–400)
RBC: 3.32 10*6/uL — ABNORMAL LOW (ref 3.70–5.32)
RDW: 12.5 % (ref 11.1–15.7)
WBC: 2.2 10*3/uL — ABNORMAL LOW (ref 3.9–10.0)

## 2014-01-30 MED ORDER — DARBEPOETIN ALFA-POLYSORBATE 300 MCG/0.6ML IJ SOLN
INTRAMUSCULAR | Status: AC
  Filled 2014-01-30: qty 0.6

## 2014-01-30 NOTE — Progress Notes (Signed)
Patient comes in today, CBC checked, Aranasp injection not given due to parameters.  Hgb was 10.1 .  Patient made aware and copy of lab work given.

## 2014-02-24 ENCOUNTER — Other Ambulatory Visit (HOSPITAL_BASED_OUTPATIENT_CLINIC_OR_DEPARTMENT_OTHER): Payer: Medicaid Other | Admitting: Lab

## 2014-02-24 ENCOUNTER — Telehealth: Payer: Self-pay | Admitting: Hematology & Oncology

## 2014-02-24 ENCOUNTER — Ambulatory Visit (HOSPITAL_BASED_OUTPATIENT_CLINIC_OR_DEPARTMENT_OTHER): Payer: Medicaid Other

## 2014-02-24 ENCOUNTER — Encounter: Payer: Self-pay | Admitting: Hematology & Oncology

## 2014-02-24 ENCOUNTER — Ambulatory Visit (HOSPITAL_BASED_OUTPATIENT_CLINIC_OR_DEPARTMENT_OTHER): Payer: Medicaid Other | Admitting: Hematology & Oncology

## 2014-02-24 VITALS — BP 140/77 | HR 70 | Temp 97.8°F | Resp 14 | Ht 60.0 in | Wt 150.0 lb

## 2014-02-24 DIAGNOSIS — N189 Chronic kidney disease, unspecified: Secondary | ICD-10-CM

## 2014-02-24 DIAGNOSIS — D631 Anemia in chronic kidney disease: Secondary | ICD-10-CM

## 2014-02-24 DIAGNOSIS — J029 Acute pharyngitis, unspecified: Secondary | ICD-10-CM

## 2014-02-24 DIAGNOSIS — N039 Chronic nephritic syndrome with unspecified morphologic changes: Secondary | ICD-10-CM

## 2014-02-24 DIAGNOSIS — D61818 Other pancytopenia: Secondary | ICD-10-CM

## 2014-02-24 DIAGNOSIS — Z944 Liver transplant status: Secondary | ICD-10-CM

## 2014-02-24 DIAGNOSIS — K754 Autoimmune hepatitis: Secondary | ICD-10-CM

## 2014-02-24 LAB — CBC WITH DIFFERENTIAL (CANCER CENTER ONLY)
BASO#: 0 10*3/uL (ref 0.0–0.2)
BASO%: 0 % (ref 0.0–2.0)
EOS%: 1.5 % (ref 0.0–7.0)
Eosinophils Absolute: 0 10*3/uL (ref 0.0–0.5)
HCT: 26.7 % — ABNORMAL LOW (ref 34.8–46.6)
HGB: 8.8 g/dL — ABNORMAL LOW (ref 11.6–15.9)
LYMPH#: 0.6 10*3/uL — ABNORMAL LOW (ref 0.9–3.3)
LYMPH%: 28.8 % (ref 14.0–48.0)
MCH: 31.3 pg (ref 26.0–34.0)
MCHC: 33 g/dL (ref 32.0–36.0)
MCV: 95 fL (ref 81–101)
MONO#: 0.3 10*3/uL (ref 0.1–0.9)
MONO%: 13.2 % — AB (ref 0.0–13.0)
NEUT%: 56.5 % (ref 39.6–80.0)
NEUTROS ABS: 1.2 10*3/uL — AB (ref 1.5–6.5)
PLATELETS: 133 10*3/uL — AB (ref 145–400)
RBC: 2.81 10*6/uL — AB (ref 3.70–5.32)
RDW: 14 % (ref 11.1–15.7)
WBC: 2.1 10*3/uL — ABNORMAL LOW (ref 3.9–10.0)

## 2014-02-24 LAB — CMP (CANCER CENTER ONLY)
ALBUMIN: 3.3 g/dL (ref 3.3–5.5)
ALT: 11 U/L (ref 10–47)
AST: 23 U/L (ref 11–38)
Alkaline Phosphatase: 69 U/L (ref 26–84)
BUN, Bld: 31 mg/dL — ABNORMAL HIGH (ref 7–22)
CALCIUM: 8.6 mg/dL (ref 8.0–10.3)
CO2: 28 meq/L (ref 18–33)
Chloride: 104 mEq/L (ref 98–108)
Creat: 1.7 mg/dl — ABNORMAL HIGH (ref 0.6–1.2)
Glucose, Bld: 84 mg/dL (ref 73–118)
Potassium: 5.8 mEq/L — ABNORMAL HIGH (ref 3.3–4.7)
Sodium: 142 mEq/L (ref 128–145)
TOTAL PROTEIN: 8.8 g/dL — AB (ref 6.4–8.1)
Total Bilirubin: 0.6 mg/dl (ref 0.20–1.60)

## 2014-02-24 LAB — RETICULOCYTES (CHCC)
ABS RETIC: 40.6 10*3/uL (ref 19.0–186.0)
RBC.: 2.9 MIL/uL — ABNORMAL LOW (ref 3.87–5.11)
Retic Ct Pct: 1.4 % (ref 0.4–2.3)

## 2014-02-24 LAB — IRON AND TIBC CHCC
%SAT: 51 % (ref 21–57)
IRON: 95 ug/dL (ref 41–142)
TIBC: 185 ug/dL — AB (ref 236–444)
UIBC: 90 ug/dL — ABNORMAL LOW (ref 120–384)

## 2014-02-24 LAB — FERRITIN CHCC: Ferritin: 763 ng/ml — ABNORMAL HIGH (ref 9–269)

## 2014-02-24 MED ORDER — DARBEPOETIN ALFA-POLYSORBATE 300 MCG/0.6ML IJ SOLN
300.0000 ug | Freq: Once | INTRAMUSCULAR | Status: AC
  Administered 2014-02-24: 300 ug via SUBCUTANEOUS

## 2014-02-24 MED ORDER — AZITHROMYCIN 250 MG PO TABS: ORAL_TABLET | ORAL | Status: DC

## 2014-02-24 MED ORDER — DARBEPOETIN ALFA-POLYSORBATE 300 MCG/0.6ML IJ SOLN
INTRAMUSCULAR | Status: AC
  Filled 2014-02-24: qty 0.6

## 2014-02-24 NOTE — Patient Instructions (Signed)
Darbepoetin Alfa injection What is this medicine? DARBEPOETIN ALFA (dar be POE e tin AL fa) helps your body make more red blood cells. It is used to treat anemia caused by chronic kidney failure and chemotherapy. This medicine may be used for other purposes; ask your health care provider or pharmacist if you have questions. COMMON BRAND NAME(S): Aranesp What should I tell my health care provider before I take this medicine? They need to know if you have any of these conditions: -blood clotting disorders or history of blood clots -cancer patient not on chemotherapy -cystic fibrosis -heart disease, such as angina, heart failure, or a history of a heart attack -hemoglobin level of 12 g/dL or greater -high blood pressure -low levels of folate, iron, or vitamin B12 -seizures -an unusual or allergic reaction to darbepoetin, erythropoietin, albumin, hamster proteins, latex, other medicines, foods, dyes, or preservatives -pregnant or trying to get pregnant -breast-feeding How should I use this medicine? This medicine is for injection into a vein or under the skin. It is usually given by a health care professional in a hospital or clinic setting. If you get this medicine at home, you will be taught how to prepare and give this medicine. Do not shake the solution before you withdraw a dose. Use exactly as directed. Take your medicine at regular intervals. Do not take your medicine more often than directed. It is important that you put your used needles and syringes in a special sharps container. Do not put them in a trash can. If you do not have a sharps container, call your pharmacist or healthcare provider to get one. Talk to your pediatrician regarding the use of this medicine in children. While this medicine may be used in children as young as 1 year for selected conditions, precautions do apply. Overdosage: If you think you have taken too much of this medicine contact a poison control center or  emergency room at once. NOTE: This medicine is only for you. Do not share this medicine with others. What if I miss a dose? If you miss a dose, take it as soon as you can. If it is almost time for your next dose, take only that dose. Do not take double or extra doses. What may interact with this medicine? Do not take this medicine with any of the following medications: -epoetin alfa This list may not describe all possible interactions. Give your health care provider a list of all the medicines, herbs, non-prescription drugs, or dietary supplements you use. Also tell them if you smoke, drink alcohol, or use illegal drugs. Some items may interact with your medicine. What should I watch for while using this medicine? Visit your prescriber or health care professional for regular checks on your progress and for the needed blood tests and blood pressure measurements. It is especially important for the doctor to make sure your hemoglobin level is in the desired range, to limit the risk of potential side effects and to give you the best benefit. Keep all appointments for any recommended tests. Check your blood pressure as directed. Ask your doctor what your blood pressure should be and when you should contact him or her. As your body makes more red blood cells, you may need to take iron, folic acid, or vitamin B supplements. Ask your doctor or health care provider which products are right for you. If you have kidney disease continue dietary restrictions, even though this medication can make you feel better. Talk with your doctor or health   care professional about the foods you eat and the vitamins that you take. What side effects may I notice from receiving this medicine? Side effects that you should report to your doctor or health care professional as soon as possible: -allergic reactions like skin rash, itching or hives, swelling of the face, lips, or tongue -breathing problems -changes in vision -chest  pain -confusion, trouble speaking or understanding -feeling faint or lightheaded, falls -high blood pressure -muscle aches or pains -pain, swelling, warmth in the leg -rapid weight gain -severe headaches -sudden numbness or weakness of the face, arm or leg -trouble walking, dizziness, loss of balance or coordination -seizures (convulsions) -swelling of the ankles, feet, hands -unusually weak or tired Side effects that usually do not require medical attention (report to your doctor or health care professional if they continue or are bothersome): -diarrhea -fever, chills (flu-like symptoms) -headaches -nausea, vomiting -redness, stinging, or swelling at site where injected This list may not describe all possible side effects. Call your doctor for medical advice about side effects. You may report side effects to FDA at 1-800-FDA-1088. Where should I keep my medicine? Keep out of the reach of children. Store in a refrigerator between 2 and 8 degrees C (36 and 46 degrees F). Do not freeze. Do not shake. Throw away any unused portion if using a single-dose vial. Throw away any unused medicine after the expiration date. NOTE: This sheet is a summary. It may not cover all possible information. If you have questions about this medicine, talk to your doctor, pharmacist, or health care provider.  2014, [employer]. (2008-09-23 10:23:57)  

## 2014-02-24 NOTE — Telephone Encounter (Signed)
Left message with 5-29 appointment

## 2014-02-24 NOTE — Progress Notes (Signed)
Hematology and Oncology Follow Up Visit  Tami [name] [id number] [birth year] 58 y.o. 02/24/2014   Principle Diagnosis:  1. Chronic pancytopenia. 2. Status post liver transplant for autoimmune hepatitis. 3. Renal insufficiency with anemia.  Current Therapy:   Aranesp 300 mcg subcu as needed for hemoglobin less than 10.     Interim History:  [name] is back for followup. She has a sore throat. She says her grandson, who stays with her and has strep throat. Given that she does have a compromised immune system, we will have to give her some antibiotics. I will put her on a Z-Pak.  Otherwise she's been doing okay. She does feel a little tired.  She's had no bleeding. He's had no problems with bowels or bladder. She sees her transplant doctors at [facility] this week. She continues on cyclosporin.  She's had no obvious bleeding. She's had no nausea vomiting. There's been no leg swelling. She's had no rashes.  Medications: Current outpatient prescriptions:amLODipine (NORVASC) 10 MG tablet, Take 10 mg by mouth daily.  , Disp: , Rfl: ;  Ascorbic Acid (VITAMIN C) 250 MG CHEW, Chew by mouth every morning., Disp: , Rfl: ;  calcitonin, salmon, (MIACALCIN/FORTICAL) 200 UNIT/ACT nasal spray, Place 1 spray into alternate nostrils daily., Disp: , Rfl: ;  Calcium Carbonate-Vit D-Min (CALCIUM 1200 PO), Take by mouth 2 (two) times daily., Disp: , Rfl:  cycloSPORINE modified (NEORAL) 25 MG capsule, Take 25 mg by mouth 2 (two) times daily. 3 tabs bid, Disp: , Rfl: ;  diclofenac sodium (VOLTAREN) 1 % GEL, Apply 2 g topically 4 (four) times daily., Disp: 3 Tube, Rfl: 2;  HYDROCHLOROTHIAZIDE PO, Take 25 mg by mouth daily. Takes 12.5 mg., Disp: , Rfl: ;  metoprolol succinate (TOPROL-XL) 25 MG 24 hr tablet, Take 25 mg by mouth daily., Disp: , Rfl:  Multiple Vitamins-Minerals (MULTIVITAMIN WITH MINERALS) tablet, Take 1 tablet by mouth daily.  , Disp: , Rfl: ;  ondansetron (ZOFRAN) 4 MG tablet, Take 4 mg by mouth every  4 (four) hours as needed for nausea or vomiting., Disp: , Rfl: ;  oxyCODONE (ROXICODONE) 15 MG immediate release tablet, Take 15 mg by mouth every 6 (six) hours as needed., Disp: , Rfl:  Vitamin D, Ergocalciferol, (DRISDOL) 50000 UNITS CAPS, Take 50,000 Units by mouth every 7 (seven) days., Disp: , Rfl: ;  zolpidem (AMBIEN) 5 MG tablet, Take 1 tablet (5 mg total) by mouth at bedtime as needed for sleep., Disp: 30 tablet, Rfl: 0;  azithromycin (ZITHROMAX Z-PAK) 250 MG tablet, Take as directed ., Disp: 6 each, Rfl: 0 No current facility-administered medications for this visit. Facility-Administered Medications Ordered in Other Visits: darbepoetin (ARANESP) injection 300 mcg, 300 mcg, Subcutaneous, Once, [name], MD  Allergies: No Known Allergies  Past Medical History, Surgical history, Social history, and Family History were reviewed and updated.  Review of Systems: As above  Physical Exam:  height is 5' (1.524 m) and weight is 150 lb (68.04 kg). Her oral temperature is 97.8 F (36.6 C). Her blood pressure is 140/77 and her pulse is 70. Her respiration is 14.   Petit African American female. Her head and neck exam shows moderate erythema in the pharynx. I do not see any exudate. Neck is with some slight adenopathy in the upper cervical nodes. Lungs are clear. Cardiac exam regular in rhythm. Abdomen soft. Has good bowel sounds. She does have a laparotomy scars. She has an abdominal wall hernia. No fluid wave. No palpable  liver or spleen tip. Extremities shows no clubbing cyanosis or edema. Skin exam shows no rashes. Neurological exam no focal neurological deficits.  Lab Results  Component Value Date   WBC 2.1* 02/24/2014   HGB 8.8* 02/24/2014   HCT 26.7* 02/24/2014   MCV 95 02/24/2014   PLT 133* 02/24/2014     Chemistry      Component Value Date/Time   NA 142 02/24/2014 1203   NA 137 11/25/2013 1155   K 5.8* 02/24/2014 1203   K 4.9 11/25/2013 1155   CL 104 02/24/2014 1203   CL 106 11/25/2013 1155    CO2 28 02/24/2014 1203   CO2 22 11/25/2013 1155   BUN 31* 02/24/2014 1203   BUN 30* 11/25/2013 1155   CREATININE 1.7* 02/24/2014 1203   CREATININE 1.31* 11/25/2013 1155      Component Value Date/Time   CALCIUM 8.6 02/24/2014 1203   CALCIUM 8.8 11/25/2013 1155   ALKPHOS 69 02/24/2014 1203   ALKPHOS 64 11/25/2013 1155   AST 23 02/24/2014 1203   AST 15 11/25/2013 1155   ALT 11 02/24/2014 1203   ALT <8 11/25/2013 1155   BILITOT 0.60 02/24/2014 1203   BILITOT 0.5 11/25/2013 1155         Impression and Plan: Tami [name] is 58 year old African female. She has a history of liver transplant econdary to autoimmune hepatitis. She's done very well with this. Hopefully liver rejection will be a problem.  Her potassium is on the high side. She has had it before. She's asymptomatic with this.  Her hemoglobin is down. We will go ahead and give her a dose of Aranesp today.  I want to see her back in another month or so.     [name], MD 5/4/20152:03 PM

## 2014-03-19 ENCOUNTER — Telehealth: Payer: Self-pay | Admitting: Hematology & Oncology

## 2014-03-19 NOTE — Telephone Encounter (Signed)
Pt left message wanting appointment. I called back no answer and voice mail is full. She called and is aware of 5-29 appointment

## 2014-03-21 ENCOUNTER — Other Ambulatory Visit: Payer: Self-pay | Admitting: Nurse Practitioner

## 2014-03-21 ENCOUNTER — Other Ambulatory Visit (HOSPITAL_BASED_OUTPATIENT_CLINIC_OR_DEPARTMENT_OTHER): Payer: Medicaid Other | Admitting: Lab

## 2014-03-21 ENCOUNTER — Ambulatory Visit: Payer: Medicaid Other

## 2014-03-21 ENCOUNTER — Encounter: Payer: Medicaid Other | Admitting: Hematology & Oncology

## 2014-03-21 DIAGNOSIS — N189 Chronic kidney disease, unspecified: Principal | ICD-10-CM

## 2014-03-21 DIAGNOSIS — J029 Acute pharyngitis, unspecified: Secondary | ICD-10-CM

## 2014-03-21 DIAGNOSIS — D631 Anemia in chronic kidney disease: Secondary | ICD-10-CM

## 2014-03-21 DIAGNOSIS — G4701 Insomnia due to medical condition: Secondary | ICD-10-CM

## 2014-03-21 DIAGNOSIS — N039 Chronic nephritic syndrome with unspecified morphologic changes: Secondary | ICD-10-CM

## 2014-03-21 DIAGNOSIS — D61818 Other pancytopenia: Secondary | ICD-10-CM

## 2014-03-21 DIAGNOSIS — K754 Autoimmune hepatitis: Secondary | ICD-10-CM

## 2014-03-21 LAB — CMP (CANCER CENTER ONLY)
ALBUMIN: 3.4 g/dL (ref 3.3–5.5)
ALK PHOS: 68 U/L (ref 26–84)
ALT: 15 U/L (ref 10–47)
AST: 22 U/L (ref 11–38)
BUN: 26 mg/dL — AB (ref 7–22)
CHLORIDE: 107 meq/L (ref 98–108)
CO2: 24 mEq/L (ref 18–33)
Calcium: 8.9 mg/dL (ref 8.0–10.3)
Creat: 1.2 mg/dl (ref 0.6–1.2)
Glucose, Bld: 106 mg/dL (ref 73–118)
POTASSIUM: 4.8 meq/L — AB (ref 3.3–4.7)
SODIUM: 140 meq/L (ref 128–145)
Total Bilirubin: 0.6 mg/dl (ref 0.20–1.60)
Total Protein: 8.8 g/dL — ABNORMAL HIGH (ref 6.4–8.1)

## 2014-03-21 LAB — CBC WITH DIFFERENTIAL (CANCER CENTER ONLY)
BASO#: 0 10*3/uL (ref 0.0–0.2)
BASO%: 0 % (ref 0.0–2.0)
EOS ABS: 0 10*3/uL (ref 0.0–0.5)
EOS%: 2.2 % (ref 0.0–7.0)
HCT: 32.7 % — ABNORMAL LOW (ref 34.8–46.6)
HEMOGLOBIN: 10.8 g/dL — AB (ref 11.6–15.9)
LYMPH#: 0.5 10*3/uL — AB (ref 0.9–3.3)
LYMPH%: 29.1 % (ref 14.0–48.0)
MCH: 30.4 pg (ref 26.0–34.0)
MCHC: 33 g/dL (ref 32.0–36.0)
MCV: 92 fL (ref 81–101)
MONO#: 0.2 10*3/uL (ref 0.1–0.9)
MONO%: 10.4 % (ref 0.0–13.0)
NEUT#: 1.1 10*3/uL — ABNORMAL LOW (ref 1.5–6.5)
NEUT%: 58.3 % (ref 39.6–80.0)
Platelets: 124 10*3/uL — ABNORMAL LOW (ref 145–400)
RBC: 3.55 10*6/uL — ABNORMAL LOW (ref 3.70–5.32)
RDW: 12.2 % (ref 11.1–15.7)
WBC: 1.8 10*3/uL — AB (ref 3.9–10.0)

## 2014-03-21 LAB — IRON AND TIBC CHCC
%SAT: 78 % — AB (ref 21–57)
Iron: 139 ug/dL (ref 41–142)
TIBC: 178 ug/dL — ABNORMAL LOW (ref 236–444)
UIBC: 39 ug/dL — AB (ref 120–384)

## 2014-03-21 LAB — FERRITIN CHCC: FERRITIN: 837 ng/mL — AB (ref 9–269)

## 2014-03-21 MED ORDER — PROMETHAZINE HCL 12.5 MG PO TABS: 12.5000 mg | ORAL_TABLET | Freq: Four times a day (QID) | ORAL | Status: DC | PRN

## 2014-03-21 NOTE — Progress Notes (Signed)
Aranesp not given due to HGB 10.8

## 2014-04-11 ENCOUNTER — Other Ambulatory Visit (HOSPITAL_BASED_OUTPATIENT_CLINIC_OR_DEPARTMENT_OTHER): Payer: Medicaid Other | Admitting: Lab

## 2014-04-11 ENCOUNTER — Ambulatory Visit (HOSPITAL_BASED_OUTPATIENT_CLINIC_OR_DEPARTMENT_OTHER): Payer: Medicaid Other

## 2014-04-11 VITALS — BP 166/99 | HR 84 | Temp 98.2°F

## 2014-04-11 DIAGNOSIS — N189 Chronic kidney disease, unspecified: Principal | ICD-10-CM

## 2014-04-11 DIAGNOSIS — N039 Chronic nephritic syndrome with unspecified morphologic changes: Secondary | ICD-10-CM

## 2014-04-11 DIAGNOSIS — D631 Anemia in chronic kidney disease: Secondary | ICD-10-CM

## 2014-04-11 LAB — CBC WITH DIFFERENTIAL (CANCER CENTER ONLY)
BASO#: 0 10*3/uL (ref 0.0–0.2)
BASO%: 0 % (ref 0.0–2.0)
EOS ABS: 0.1 10*3/uL (ref 0.0–0.5)
EOS%: 3.3 % (ref 0.0–7.0)
HCT: 28.6 % — ABNORMAL LOW (ref 34.8–46.6)
HGB: 9.4 g/dL — ABNORMAL LOW (ref 11.6–15.9)
LYMPH#: 0.6 10*3/uL — AB (ref 0.9–3.3)
LYMPH%: 24.1 % (ref 14.0–48.0)
MCH: 30.3 pg (ref 26.0–34.0)
MCHC: 32.9 g/dL (ref 32.0–36.0)
MCV: 92 fL (ref 81–101)
MONO#: 0.2 10*3/uL (ref 0.1–0.9)
MONO%: 9.1 % (ref 0.0–13.0)
NEUT%: 63.5 % (ref 39.6–80.0)
NEUTROS ABS: 1.5 10*3/uL (ref 1.5–6.5)
Platelets: 102 10*3/uL — ABNORMAL LOW (ref 145–400)
RBC: 3.1 10*6/uL — AB (ref 3.70–5.32)
RDW: 12.3 % (ref 11.1–15.7)
WBC: 2.4 10*3/uL — ABNORMAL LOW (ref 3.9–10.0)

## 2014-04-11 MED ORDER — DARBEPOETIN ALFA-POLYSORBATE 300 MCG/0.6ML IJ SOLN
300.0000 ug | Freq: Once | INTRAMUSCULAR | Status: AC
  Administered 2014-04-11: 300 ug via SUBCUTANEOUS

## 2014-04-11 MED ORDER — DARBEPOETIN ALFA-POLYSORBATE 300 MCG/0.6ML IJ SOLN
INTRAMUSCULAR | Status: AC
  Filled 2014-04-11: qty 0.6

## 2014-04-11 NOTE — Patient Instructions (Addendum)
Darbepoetin Alfa injection What is this medicine? DARBEPOETIN ALFA (dar be POE e tin AL fa) helps your body make more red blood cells. It is used to treat anemia caused by chronic kidney failure and chemotherapy. This medicine may be used for other purposes; ask your health care provider or pharmacist if you have questions. COMMON BRAND NAME(S): Aranesp What should I tell my health care provider before I take this medicine? They need to know if you have any of these conditions: -blood clotting disorders or history of blood clots -cancer patient not on chemotherapy -cystic fibrosis -heart disease, such as angina, heart failure, or a history of a heart attack -hemoglobin level of 12 g/dL or greater -high blood pressure -low levels of folate, iron, or vitamin B12 -seizures -an unusual or allergic reaction to darbepoetin, erythropoietin, albumin, hamster proteins, latex, other medicines, foods, dyes, or preservatives -pregnant or trying to get pregnant -breast-feeding How should I use this medicine? This medicine is for injection into a vein or under the skin. It is usually given by a health care professional in a hospital or clinic setting. If you get this medicine at home, you will be taught how to prepare and give this medicine. Do not shake the solution before you withdraw a dose. Use exactly as directed. Take your medicine at regular intervals. Do not take your medicine more often than directed. It is important that you put your used needles and syringes in a special sharps container. Do not put them in a trash can. If you do not have a sharps container, call your pharmacist or healthcare provider to get one. Talk to your pediatrician regarding the use of this medicine in children. While this medicine may be used in children as young as 1 year for selected conditions, precautions do apply. Overdosage: If you think you have taken too much of this medicine contact a poison control center or  emergency room at once. NOTE: This medicine is only for you. Do not share this medicine with others. What if I miss a dose? If you miss a dose, take it as soon as you can. If it is almost time for your next dose, take only that dose. Do not take double or extra doses. What may interact with this medicine? Do not take this medicine with any of the following medications: -epoetin alfa This list may not describe all possible interactions. Give your health care provider a list of all the medicines, herbs, non-prescription drugs, or dietary supplements you use. Also tell them if you smoke, drink alcohol, or use illegal drugs. Some items may interact with your medicine. What should I watch for while using this medicine? Visit your prescriber or health care professional for regular checks on your progress and for the needed blood tests and blood pressure measurements. It is especially important for the doctor to make sure your hemoglobin level is in the desired range, to limit the risk of potential side effects and to give you the best benefit. Keep all appointments for any recommended tests. Check your blood pressure as directed. Ask your doctor what your blood pressure should be and when you should contact him or her. As your body makes more red blood cells, you may need to take iron, folic acid, or vitamin B supplements. Ask your doctor or health care provider which products are right for you. If you have kidney disease continue dietary restrictions, even though this medication can make you feel better. Talk with your doctor or health   care professional about the foods you eat and the vitamins that you take. What side effects may I notice from receiving this medicine? Side effects that you should report to your doctor or health care professional as soon as possible: -allergic reactions like skin rash, itching or hives, swelling of the face, lips, or tongue -breathing problems -changes in vision -chest  pain -confusion, trouble speaking or understanding -feeling faint or lightheaded, falls -high blood pressure -muscle aches or pains -pain, swelling, warmth in the leg -rapid weight gain -severe headaches -sudden numbness or weakness of the face, arm or leg -trouble walking, dizziness, loss of balance or coordination -seizures (convulsions) -swelling of the ankles, feet, hands -unusually weak or tired Side effects that usually do not require medical attention (report to your doctor or health care professional if they continue or are bothersome): -diarrhea -fever, chills (flu-like symptoms) -headaches -nausea, vomiting -redness, stinging, or swelling at site where injected This list may not describe all possible side effects. Call your doctor for medical advice about side effects. You may report side effects to FDA at 1-800-FDA-1088. Where should I keep my medicine? Keep out of the reach of children. Store in a refrigerator between 2 and 8 degrees C (36 and 46 degrees F). Do not freeze. Do not shake. Throw away any unused portion if using a single-dose vial. Throw away any unused medicine after the expiration date. NOTE: This sheet is a summary. It may not cover all possible information. If you have questions about this medicine, talk to your doctor, pharmacist, or health care provider.  2015, Elsevier/Gold Standard. (2008-09-23 10:23:57) Darbepoetin Alfa injection Qu es este medicamento? La DARBEPOETINA ALFA ayuda estimular la produccin de glbulos rojos en el cuerpo. Este medicamento se [place] para tratar la anemia asociada con la insuficiencia renal crnica y la quimioterapia. Este medicamento puede ser utilizado para otros usos; si tiene alguna pregunta consulte con su proveedor de atencin mdica o con su farmacutico. MARCAS COMERCIALES DISPONIBLES: Aranesp Qu le debo informar a mi profesional de la salud antes de tomar este medicamento? Necesita saber si usted presenta  alguno de los siguientes problemas o situaciones: -trastornos de Proofreader de la sangre o antecedentes de cogulos sanguneos -paciente con cncer que no recibe quimioterapia -fibrosis qustica -enfermedades cardiacas, tales como angina de pecho, insuficiencia cardiaca o antecedentes de ataques cardiacos -nivel de hemoglobina de 12 g/dL o ms -alta presin sangunea -niveles bajos de folato, hierro o vitamina B12 -convulsiones -una reaccin alrgica o inusual a la darbepoetina, a la eritropoyetina, a la albmina, a las Oceanographer, al ltex, a otros medicamentos, alimentos, colorantes o conservantes -si est embarazada o buscando quedar embarazada -si est amamantando a un beb Cmo debo [employer]? Este medicamento se administra mediante una inyeccin por va intravenosa o subcutnea. Generalmente lo administra un profesional de Technical sales engineer en un hospital o en un entorno clnico. Si recibe [employer] en su domicilio, le ensearn cmo preparar y Biomedical engineer. No agite la solucin antes de extraer una dosis. selo exactamente como se le indique. Use su dosis a intervalos regulares. No use su medicamento con una frecuencia mayor a la indicada. Es importante que deseche las agujas y las jeringas usadas en un recipiente resistente a los pinchazos. No las deseche en una basura. Si no tiene un recipiente resistente a los pinchazos, consulte a Midwife o su proveedor de atencin para obtenerlo. Hable con su pediatra para informarse acerca del uso de este medicamento en nios. Aunque  este medicamento ha sido recetado a nios tan menores como de 1 ao de edad para condiciones selectivas, las precauciones se aplican. Sobredosis: Pngase en contacto inmediatamente con un centro toxicolgico o una sala de urgencia si usted cree que haya tomado demasiado medicamento. ATENCIN: [employer] es solo para usted. No comparta este medicamento con  nadie. Qu sucede si me olvido de una dosis? Si olvida una dosis, sela lo antes posible. Si es casi la hora de la prxima dosis, use slo esa dosis. No use dosis adicionales o dobles. Qu puede interactuar con este medicamento? No tome esta medicina con ninguno de los siguientes medicamentos: -epoetina alfa Puede ser que esta lista no menciona todas las posibles interacciones. Informe a su profesional de [employer]	[place] de [employer] productos a base de hierbas, medicamentos de [place] o suplementos nutritivos que est tomando. Si usted fuma, consume bebidas alcohlicas o si utiliza drogas ilegales, indqueselo tambin a su profesional de [employer]	[place]. Algunas sustancias pueden interactuar con su medicamento. A qu debo estar atento al usar [employer]? Debe visitar al profesional que extiende sus recetas o a su profesional de la salud para chequear su evolucin peridicamente y para realizarse anlisis de sangre y Chief Technology Officer la presin sangunea. Es especialmente importante para que su mdico pueda asegurar que sus niveles de hemoglobina estn a niveles adecuados para limitar el riesgo de los efectos secundarios potenciales y para darle el mejor beneficio. Asista a todas las citas para las pruebas recomendadas. Controle su presin sangunea como se le haya indicado. Pregunte a su mdico cul debe ser su presin sangunea y cundo deber comunicarse con l o ella. A medida que su cuerpo produzca ms glbulos rojos, puede ser necesario que tome suplementos de hierro, cido flico o vitamina B. Consulte a su mdico o a su profesional de la salud sobre cules son los productos adecuados para usted. Si padece una enfermedad renal, contine con las restricciones en la dieta de rutina, aun cuando este medicamento lo haga Building control surveyor. Consulte a su mdico o a Barrister's clerk de la [employer] alimentos y vitaminas que est tomando. Qu efectos secundarios puedo tener al [employer] este medicamento? Efectos  secundarios que debe informar a su mdico o a Barrister's clerk de la salud tan pronto como sea posible: -Chief of Staff como erupcin cutnea, picazn o urticarias, hinchazn de la cara, labios o lengua -problemas respiratorios -cambios en la visin -dolor en el pecho -confusin, dificultad para hablar o entender -sensacin de desmayos o mareos, cadas -alta presin sangunea -molestias o dolores musculares -dolor, hinchazn, sensacin de calor en la pierna -rpido aumento de peso -dolor de Patent examiner -entumecimiento o debilidad repentina de la cara, brazo o pierna -problemas para caminar, mareos, prdida de la coordinacin o equilibrio -convulsiones -hinchazn de pies o tobillos -cansancio o debilidad inusual Efectos secundarios que, por lo general, no requieren atencin mdica (debe informarlos a su mdico o a su profesional de la salud si persisten o si son molestos): -diarrea -fiebre, escalofros (sntomas tipo gripales) -dolor de cabeza -nuseas, vmito -enrojecimiento, escozor o Estate agent de la inyeccin Puede ser que esta lista no menciona todos los posibles efectos secundarios. Comunquese a su mdico por asesoramiento mdico [employer] Usted puede informar los efectos secundarios a la FDA por telfono al 1-800-FDA-1088. Dnde debo guardar mi medicina? Mantngala fuera del alcance de los nios. Gurdela en un refrigerador, a Energy manager entre 2 y 73 grados C (54 y 87 grados F). No  la congele. No la agite. Si utiliza una ampolla de dosis nica, deseche todo el medicamento que no haya utilizado. Deseche todo el medicamento que no haya utilizado, despus de la fecha de vencimiento. ATENCIN: Este folleto es un resumen. Puede ser que no cubra toda la posible informacin. Si usted tiene preguntas acerca de esta medicina, consulte con su mdico, su farmacutico o su profesional de Technical sales engineer.  2015, Elsevier/Gold Standard. (2008-10-30  15:50:43)

## 2014-05-02 ENCOUNTER — Other Ambulatory Visit: Payer: Medicaid Other | Admitting: Lab

## 2014-05-02 ENCOUNTER — Ambulatory Visit: Payer: Medicaid Other

## 2014-05-16 ENCOUNTER — Telehealth: Payer: Self-pay | Admitting: Hematology & Oncology

## 2014-05-16 ENCOUNTER — Ambulatory Visit: Payer: Medicaid Other

## 2014-05-16 ENCOUNTER — Ambulatory Visit: Payer: Medicaid Other | Admitting: Family

## 2014-05-16 ENCOUNTER — Other Ambulatory Visit: Payer: Medicaid Other | Admitting: Lab

## 2014-05-16 NOTE — Telephone Encounter (Signed)
Pt called said she wanted to cx todays appointment she is aware her appointment time was now. She rescheduled to 8-5

## 2014-05-22 ENCOUNTER — Other Ambulatory Visit (HOSPITAL_BASED_OUTPATIENT_CLINIC_OR_DEPARTMENT_OTHER): Payer: Medicaid Other | Admitting: Lab

## 2014-05-22 ENCOUNTER — Other Ambulatory Visit: Payer: Self-pay | Admitting: Nurse Practitioner

## 2014-05-22 DIAGNOSIS — D631 Anemia in chronic kidney disease: Secondary | ICD-10-CM

## 2014-05-22 DIAGNOSIS — N189 Chronic kidney disease, unspecified: Secondary | ICD-10-CM

## 2014-05-22 DIAGNOSIS — K754 Autoimmune hepatitis: Secondary | ICD-10-CM

## 2014-05-22 DIAGNOSIS — N039 Chronic nephritic syndrome with unspecified morphologic changes: Secondary | ICD-10-CM

## 2014-05-22 LAB — CBC WITH DIFFERENTIAL (CANCER CENTER ONLY)
BASO#: 0 10*3/uL (ref 0.0–0.2)
BASO%: 0 % (ref 0.0–2.0)
EOS ABS: 0 10*3/uL (ref 0.0–0.5)
EOS%: 1.6 % (ref 0.0–7.0)
HEMATOCRIT: 35.3 % (ref 34.8–46.6)
HEMOGLOBIN: 11.8 g/dL (ref 11.6–15.9)
LYMPH#: 0.6 10*3/uL — AB (ref 0.9–3.3)
LYMPH%: 24.8 % (ref 14.0–48.0)
MCH: 29.9 pg (ref 26.0–34.0)
MCHC: 33.4 g/dL (ref 32.0–36.0)
MCV: 90 fL (ref 81–101)
MONO#: 0.3 10*3/uL (ref 0.1–0.9)
MONO%: 13.2 % — ABNORMAL HIGH (ref 0.0–13.0)
NEUT#: 1.5 10*3/uL (ref 1.5–6.5)
NEUT%: 60.4 % (ref 39.6–80.0)
Platelets: 125 10*3/uL — ABNORMAL LOW (ref 145–400)
RBC: 3.94 10*6/uL (ref 3.70–5.32)
RDW: 12.5 % (ref 11.1–15.7)
WBC: 2.5 10*3/uL — ABNORMAL LOW (ref 3.9–10.0)

## 2014-05-22 NOTE — Progress Notes (Signed)
Patient called and stated she was feeling very tired and week and thought she may be due to get her Aranesp. Pt has been set-up for lab appointment for further evaluation.

## 2014-05-28 ENCOUNTER — Ambulatory Visit (HOSPITAL_BASED_OUTPATIENT_CLINIC_OR_DEPARTMENT_OTHER): Payer: Medicaid Other | Admitting: Hematology & Oncology

## 2014-05-28 ENCOUNTER — Ambulatory Visit (HOSPITAL_BASED_OUTPATIENT_CLINIC_OR_DEPARTMENT_OTHER): Payer: Medicaid Other

## 2014-05-28 ENCOUNTER — Encounter: Payer: Self-pay | Admitting: Hematology & Oncology

## 2014-05-28 ENCOUNTER — Other Ambulatory Visit (HOSPITAL_BASED_OUTPATIENT_CLINIC_OR_DEPARTMENT_OTHER): Payer: Medicaid Other | Admitting: Lab

## 2014-05-28 VITALS — BP 140/90 | HR 82 | Temp 98.2°F | Resp 18 | Wt 154.0 lb

## 2014-05-28 DIAGNOSIS — N289 Disorder of kidney and ureter, unspecified: Secondary | ICD-10-CM

## 2014-05-28 DIAGNOSIS — N189 Chronic kidney disease, unspecified: Principal | ICD-10-CM

## 2014-05-28 DIAGNOSIS — D649 Anemia, unspecified: Secondary | ICD-10-CM

## 2014-05-28 DIAGNOSIS — D631 Anemia in chronic kidney disease: Secondary | ICD-10-CM

## 2014-05-28 DIAGNOSIS — D61818 Other pancytopenia: Secondary | ICD-10-CM

## 2014-05-28 LAB — CBC WITH DIFFERENTIAL (CANCER CENTER ONLY)
BASO#: 0 10*3/uL (ref 0.0–0.2)
BASO%: 0 % (ref 0.0–2.0)
EOS%: 2.3 % (ref 0.0–7.0)
Eosinophils Absolute: 0.1 10*3/uL (ref 0.0–0.5)
HEMATOCRIT: 30.4 % — AB (ref 34.8–46.6)
HGB: 10.3 g/dL — ABNORMAL LOW (ref 11.6–15.9)
LYMPH#: 0.7 10*3/uL — ABNORMAL LOW (ref 0.9–3.3)
LYMPH%: 27.5 % (ref 14.0–48.0)
MCH: 30.3 pg (ref 26.0–34.0)
MCHC: 33.9 g/dL (ref 32.0–36.0)
MCV: 89 fL (ref 81–101)
MONO#: 0.3 10*3/uL (ref 0.1–0.9)
MONO%: 12.1 % (ref 0.0–13.0)
NEUT#: 1.5 10*3/uL (ref 1.5–6.5)
NEUT%: 58.1 % (ref 39.6–80.0)
Platelets: 99 10*3/uL — ABNORMAL LOW (ref 145–400)
RBC: 3.4 10*6/uL — ABNORMAL LOW (ref 3.70–5.32)
RDW: 12.8 % (ref 11.1–15.7)
WBC: 2.7 10*3/uL — ABNORMAL LOW (ref 3.9–10.0)

## 2014-05-28 LAB — CMP (CANCER CENTER ONLY)
ALK PHOS: 77 U/L (ref 26–84)
ALT(SGPT): 15 U/L (ref 10–47)
AST: 23 U/L (ref 11–38)
Albumin: 3.3 g/dL (ref 3.3–5.5)
BILIRUBIN TOTAL: 0.5 mg/dL (ref 0.20–1.60)
BUN: 30 mg/dL — AB (ref 7–22)
CO2: 23 mEq/L (ref 18–33)
Calcium: 8.2 mg/dL (ref 8.0–10.3)
Chloride: 105 mEq/L (ref 98–108)
Creat: 1.8 mg/dl — ABNORMAL HIGH (ref 0.6–1.2)
Glucose, Bld: 79 mg/dL (ref 73–118)
Potassium: 5.2 mEq/L — ABNORMAL HIGH (ref 3.3–4.7)
Sodium: 140 mEq/L (ref 128–145)
Total Protein: 8.7 g/dL — ABNORMAL HIGH (ref 6.4–8.1)

## 2014-05-28 MED ORDER — DARBEPOETIN ALFA-POLYSORBATE 300 MCG/0.6ML IJ SOLN
INTRAMUSCULAR | Status: AC
  Filled 2014-05-28: qty 0.6

## 2014-05-28 MED ORDER — DARBEPOETIN ALFA-POLYSORBATE 300 MCG/0.6ML IJ SOLN
300.0000 ug | Freq: Once | INTRAMUSCULAR | Status: AC
  Administered 2014-05-28: 300 ug via SUBCUTANEOUS

## 2014-05-28 NOTE — Patient Instructions (Signed)
Darbepoetin Alfa injection What is this medicine? DARBEPOETIN ALFA (dar be POE e tin AL fa) helps your body make more red blood cells. It is used to treat anemia caused by chronic kidney failure and chemotherapy. This medicine may be used for other purposes; ask your health care provider or pharmacist if you have questions. COMMON BRAND NAME(S): Aranesp What should I tell my health care provider before I take this medicine? They need to know if you have any of these conditions: -blood clotting disorders or history of blood clots -cancer patient not on chemotherapy -cystic fibrosis -heart disease, such as angina, heart failure, or a history of a heart attack -hemoglobin level of 12 g/dL or greater -high blood pressure -low levels of folate, iron, or vitamin B12 -seizures -an unusual or allergic reaction to darbepoetin, erythropoietin, albumin, hamster proteins, latex, other medicines, foods, dyes, or preservatives -pregnant or trying to get pregnant -breast-feeding How should I use this medicine? This medicine is for injection into a vein or under the skin. It is usually given by a health care professional in a hospital or clinic setting. If you get this medicine at home, you will be taught how to prepare and give this medicine. Do not shake the solution before you withdraw a dose. Use exactly as directed. Take your medicine at regular intervals. Do not take your medicine more often than directed. It is important that you put your used needles and syringes in a special sharps container. Do not put them in a trash can. If you do not have a sharps container, call your pharmacist or healthcare provider to get one. Talk to your pediatrician regarding the use of this medicine in children. While this medicine may be used in children as young as 1 year for selected conditions, precautions do apply. Overdosage: If you think you have taken too much of this medicine contact a poison control center or  emergency room at once. NOTE: This medicine is only for you. Do not share this medicine with others. What if I miss a dose? If you miss a dose, take it as soon as you can. If it is almost time for your next dose, take only that dose. Do not take double or extra doses. What may interact with this medicine? Do not take this medicine with any of the following medications: -epoetin alfa This list may not describe all possible interactions. Give your health care provider a list of all the medicines, herbs, non-prescription drugs, or dietary supplements you use. Also tell them if you smoke, drink alcohol, or use illegal drugs. Some items may interact with your medicine. What should I watch for while using this medicine? Visit your prescriber or health care professional for regular checks on your progress and for the needed blood tests and blood pressure measurements. It is especially important for the doctor to make sure your hemoglobin level is in the desired range, to limit the risk of potential side effects and to give you the best benefit. Keep all appointments for any recommended tests. Check your blood pressure as directed. Ask your doctor what your blood pressure should be and when you should contact him or her. As your body makes more red blood cells, you may need to take iron, folic acid, or vitamin B supplements. Ask your doctor or health care provider which products are right for you. If you have kidney disease continue dietary restrictions, even though this medication can make you feel better. Talk with your doctor or health   care professional about the foods you eat and the vitamins that you take. What side effects may I notice from receiving this medicine? Side effects that you should report to your doctor or health care professional as soon as possible: -allergic reactions like skin rash, itching or hives, swelling of the face, lips, or tongue -breathing problems -changes in vision -chest  pain -confusion, trouble speaking or understanding -feeling faint or lightheaded, falls -high blood pressure -muscle aches or pains -pain, swelling, warmth in the leg -rapid weight gain -severe headaches -sudden numbness or weakness of the face, arm or leg -trouble walking, dizziness, loss of balance or coordination -seizures (convulsions) -swelling of the ankles, feet, hands -unusually weak or tired Side effects that usually do not require medical attention (report to your doctor or health care professional if they continue or are bothersome): -diarrhea -fever, chills (flu-like symptoms) -headaches -nausea, vomiting -redness, stinging, or swelling at site where injected This list may not describe all possible side effects. Call your doctor for medical advice about side effects. You may report side effects to FDA at 1-800-FDA-1088. Where should I keep my medicine? Keep out of the reach of children. Store in a refrigerator between 2 and 8 degrees C (36 and 46 degrees F). Do not freeze. Do not shake. Throw away any unused portion if using a single-dose vial. Throw away any unused medicine after the expiration date. NOTE: This sheet is a summary. It may not cover all possible information. If you have questions about this medicine, talk to your doctor, pharmacist, or health care provider.  2015, Elsevier/Gold Standard. (2008-09-23 10:23:57)  

## 2014-05-28 NOTE — Progress Notes (Signed)
Hematology and Oncology Follow Up Visit  Shamesha Suh [id number] 21-Oct-1956 58 y.o. 05/28/2014   Principle Diagnosis:  1. Chronic pancytopenia. 2. Status post liver transplant for autoimmune hepatitis. 3. Renal insufficiency with anemia.  Current Therapy:   Aranesp 300 mcg subcu as needed for hemoglobin less than 10.     Interim History:  Ms.  Hagman is back for followup. She is doing pretty well. She's had no problems with nausea vomiting. She's had no fever. She's had no abdominal pain. She's had no bleeding. There's been no leg swelling. She still goes back to [facility] for evaluations.   She's had no bleeding. He's had no problems with bowels or bladder.    Medications: Current outpatient prescriptions:amLODipine (NORVASC) 10 MG tablet, Take 10 mg by mouth daily.  , Disp: , Rfl: ;  Ascorbic Acid (VITAMIN C) 250 MG CHEW, Chew by mouth every morning., Disp: , Rfl: ;  azithromycin (ZITHROMAX Z-PAK) 250 MG tablet, Take as directed ., Disp: 6 each, Rfl: 0;  calcitonin, salmon, (MIACALCIN/FORTICAL) 200 UNIT/ACT nasal spray, Place 1 spray into alternate nostrils daily., Disp: , Rfl:  Calcium Carbonate-Vit D-Min (CALCIUM 1200 PO), Take by mouth 2 (two) times daily., Disp: , Rfl: ;  cycloSPORINE modified (NEORAL) 25 MG capsule, Take 25 mg by mouth 2 (two) times daily. 3 tabs bid, Disp: , Rfl: ;  diclofenac sodium (VOLTAREN) 1 % GEL, Apply 2 g topically 4 (four) times daily., Disp: 3 Tube, Rfl: 2;  HYDROCHLOROTHIAZIDE PO, Take 25 mg by mouth daily. Takes 12.5 mg., Disp: , Rfl:  metoprolol succinate (TOPROL-XL) 25 MG 24 hr tablet, Take 25 mg by mouth daily., Disp: , Rfl: ;  Multiple Vitamins-Minerals (MULTIVITAMIN WITH MINERALS) tablet, Take 1 tablet by mouth daily.  , Disp: , Rfl: ;  ondansetron (ZOFRAN) 4 MG tablet, Take 4 mg by mouth every 4 (four) hours as needed for nausea or vomiting., Disp: , Rfl:  oxyCODONE (ROXICODONE) 15 MG immediate release tablet, Take 15 mg by mouth every 6 (six) hours as  needed., Disp: , Rfl: ;  promethazine (PHENERGAN) 12.5 MG tablet, Take 1 tablet (12.5 mg total) by mouth every 6 (six) hours as needed., Disp: 90 tablet, Rfl: 6;  Vitamin D, Ergocalciferol, (DRISDOL) 50000 UNITS CAPS, Take 50,000 Units by mouth every 7 (seven) days., Disp: , Rfl:  zolpidem (AMBIEN) 5 MG tablet, Take 1 tablet (5 mg total) by mouth at bedtime as needed for sleep., Disp: 30 tablet, Rfl: 0  Allergies: No Known Allergies  Past Medical History, Surgical history, Social history, and Family History were reviewed and updated.  Review of Systems: As above  Physical Exam:  weight is 154 lb (69.854 kg). Her oral temperature is 98.2 F (36.8 C). Her blood pressure is 140/90 and her pulse is 82. Her respiration is 18.   Petit African American female. Her head and neck exam shows moderate erythema in the pharynx. I do not see any exudate. Neck is with some slight adenopathy in the upper cervical nodes. Lungs are clear. Cardiac exam regular in rhythm. Abdomen soft. Has good bowel sounds. She does have a laparotomy scars. She has an abdominal wall hernia. No fluid wave. No palpable liver or spleen tip. Extremities shows no clubbing cyanosis or edema. Skin exam shows no rashes. Neurological exam no focal neurological deficits.  Lab Results  Component Value Date   WBC 2.7* 05/28/2014   HGB 10.3* 05/28/2014   HCT 30.4* 05/28/2014   MCV 89 05/28/2014   PLT 99*  05/28/2014     Chemistry      Component Value Date/Time   NA 140 05/28/2014 1451   NA 137 11/25/2013 1155   K 5.2* 05/28/2014 1451   K 4.9 11/25/2013 1155   CL 105 05/28/2014 1451   CL 106 11/25/2013 1155   CO2 23 05/28/2014 1451   CO2 22 11/25/2013 1155   BUN 30* 05/28/2014 1451   BUN 30* 11/25/2013 1155   CREATININE 1.8* 05/28/2014 1451   CREATININE 1.31* 11/25/2013 1155      Component Value Date/Time   CALCIUM 8.2 05/28/2014 1451   CALCIUM 8.8 11/25/2013 1155   ALKPHOS 77 05/28/2014 1451   ALKPHOS 64 11/25/2013 1155   AST 23 05/28/2014 1451   AST 15 11/25/2013  1155   ALT 15 05/28/2014 1451   ALT <8 11/25/2013 1155   BILITOT 0.50 05/28/2014 1451   BILITOT 0.5 11/25/2013 1155         Impression and Plan: Ms. Selvaggio is 58 year old African female. She has a history of liver transplant secondary to autoimmune hepatitis. She's done very well with this. Hopefully liver rejection will not be a problem.  Her potassium is on the high side. She has had it before. She's asymptomatic with this.  Her hemoglobin is down. He though it is about 10, I will still go ahead and give her Aranesp. This makes her feel better. I know that her hemoglobin is dropping slowly so this will make quality of life better. She comes in every 3 weeks for lab work. They have will have her come back in 6 weeks.     [name], MD 8/5/20157:14 PM

## 2014-05-29 LAB — IRON AND TIBC CHCC
%SAT: 95 % — ABNORMAL HIGH (ref 21–57)
Iron: 157 ug/dL — ABNORMAL HIGH (ref 41–142)
TIBC: 165 ug/dL — ABNORMAL LOW (ref 236–444)
UIBC: 8 ug/dL — ABNORMAL LOW (ref 120–384)

## 2014-05-29 LAB — FERRITIN CHCC: Ferritin: 714 ng/ml — ABNORMAL HIGH (ref 9–269)

## 2014-06-18 ENCOUNTER — Other Ambulatory Visit: Payer: Medicaid Other | Admitting: Lab

## 2014-07-09 ENCOUNTER — Ambulatory Visit: Payer: Medicaid Other

## 2014-07-09 ENCOUNTER — Other Ambulatory Visit: Payer: Medicaid Other | Admitting: Lab

## 2014-07-09 ENCOUNTER — Ambulatory Visit: Payer: Medicaid Other | Admitting: Family

## 2014-07-17 ENCOUNTER — Telehealth: Payer: Self-pay | Admitting: Hematology & Oncology

## 2014-07-17 NOTE — Telephone Encounter (Signed)
Pt made 10-5 appointment

## 2014-07-23 NOTE — Progress Notes (Signed)
This encounter was created in error - please disregard.

## 2014-07-28 ENCOUNTER — Ambulatory Visit (HOSPITAL_BASED_OUTPATIENT_CLINIC_OR_DEPARTMENT_OTHER): Payer: Medicaid Other

## 2014-07-28 ENCOUNTER — Encounter: Payer: Self-pay | Admitting: Family

## 2014-07-28 ENCOUNTER — Ambulatory Visit (HOSPITAL_BASED_OUTPATIENT_CLINIC_OR_DEPARTMENT_OTHER): Payer: Medicaid Other | Admitting: Family

## 2014-07-28 ENCOUNTER — Other Ambulatory Visit (HOSPITAL_BASED_OUTPATIENT_CLINIC_OR_DEPARTMENT_OTHER): Payer: Medicaid Other | Admitting: Lab

## 2014-07-28 ENCOUNTER — Telehealth: Payer: Self-pay | Admitting: Hematology & Oncology

## 2014-07-28 VITALS — BP 135/78 | HR 71 | Temp 98.3°F | Resp 14 | Ht 60.0 in | Wt 158.0 lb

## 2014-07-28 DIAGNOSIS — N189 Chronic kidney disease, unspecified: Secondary | ICD-10-CM

## 2014-07-28 DIAGNOSIS — Z944 Liver transplant status: Secondary | ICD-10-CM

## 2014-07-28 DIAGNOSIS — D61818 Other pancytopenia: Secondary | ICD-10-CM

## 2014-07-28 DIAGNOSIS — D631 Anemia in chronic kidney disease: Secondary | ICD-10-CM

## 2014-07-28 DIAGNOSIS — K754 Autoimmune hepatitis: Secondary | ICD-10-CM

## 2014-07-28 LAB — CBC WITH DIFFERENTIAL (CANCER CENTER ONLY)
BASO#: 0 10*3/uL (ref 0.0–0.2)
BASO%: 0 % (ref 0.0–2.0)
EOS%: 3 % (ref 0.0–7.0)
Eosinophils Absolute: 0.1 10*3/uL (ref 0.0–0.5)
HCT: 27.4 % — ABNORMAL LOW (ref 34.8–46.6)
HGB: 9.2 g/dL — ABNORMAL LOW (ref 11.6–15.9)
LYMPH#: 0.7 10*3/uL — AB (ref 0.9–3.3)
LYMPH%: 24.8 % (ref 14.0–48.0)
MCH: 30.7 pg (ref 26.0–34.0)
MCHC: 33.6 g/dL (ref 32.0–36.0)
MCV: 91 fL (ref 81–101)
MONO#: 0.3 10*3/uL (ref 0.1–0.9)
MONO%: 10.5 % (ref 0.0–13.0)
NEUT%: 61.7 % (ref 39.6–80.0)
NEUTROS ABS: 1.6 10*3/uL (ref 1.5–6.5)
PLATELETS: 116 10*3/uL — AB (ref 145–400)
RBC: 3 10*6/uL — AB (ref 3.70–5.32)
RDW: 13.1 % (ref 11.1–15.7)
WBC: 2.7 10*3/uL — AB (ref 3.9–10.0)

## 2014-07-28 LAB — COMPREHENSIVE METABOLIC PANEL
ALBUMIN: 3.8 g/dL (ref 3.5–5.2)
ALT: 8 U/L (ref 0–35)
AST: 16 U/L (ref 0–37)
Alkaline Phosphatase: 80 U/L (ref 39–117)
BILIRUBIN TOTAL: 0.5 mg/dL (ref 0.2–1.2)
BUN: 32 mg/dL — ABNORMAL HIGH (ref 6–23)
CALCIUM: 8.4 mg/dL (ref 8.4–10.5)
CHLORIDE: 107 meq/L (ref 96–112)
CO2: 20 mEq/L (ref 19–32)
Creatinine, Ser: 1.71 mg/dL — ABNORMAL HIGH (ref 0.50–1.10)
GLUCOSE: 88 mg/dL (ref 70–99)
POTASSIUM: 5.2 meq/L (ref 3.5–5.3)
SODIUM: 134 meq/L — AB (ref 135–145)
Total Protein: 8.4 g/dL — ABNORMAL HIGH (ref 6.0–8.3)

## 2014-07-28 LAB — IRON AND TIBC CHCC
%SAT: 49 % (ref 21–57)
IRON: 76 ug/dL (ref 41–142)
TIBC: 156 ug/dL — ABNORMAL LOW (ref 236–444)
UIBC: 80 ug/dL — ABNORMAL LOW (ref 120–384)

## 2014-07-28 LAB — RETICULOCYTES (CHCC)
ABS RETIC: 52.7 10*3/uL (ref 19.0–186.0)
RBC.: 3.1 MIL/uL — AB (ref 3.87–5.11)
Retic Ct Pct: 1.7 % (ref 0.4–2.3)

## 2014-07-28 LAB — FERRITIN CHCC: Ferritin: 908 ng/ml — ABNORMAL HIGH (ref 9–269)

## 2014-07-28 LAB — CHCC SATELLITE - SMEAR

## 2014-07-28 MED ORDER — DARBEPOETIN ALFA-POLYSORBATE 300 MCG/0.6ML IJ SOLN
INTRAMUSCULAR | Status: AC
  Filled 2014-07-28: qty 0.6

## 2014-07-28 MED ORDER — DARBEPOETIN ALFA-POLYSORBATE 300 MCG/0.6ML IJ SOLN
300.0000 ug | Freq: Once | INTRAMUSCULAR | Status: AC
  Administered 2014-07-28: 300 ug via SUBCUTANEOUS

## 2014-07-28 NOTE — Telephone Encounter (Signed)
Mailed nov schedule

## 2014-07-28 NOTE — Patient Instructions (Signed)
Darbepoetin Alfa injection What is this medicine? DARBEPOETIN ALFA (dar be POE e tin AL fa) helps your body make more red blood cells. It is used to treat anemia caused by chronic kidney failure and chemotherapy. This medicine may be used for other purposes; ask your health care provider or pharmacist if you have questions. COMMON BRAND NAME(S): Aranesp What should I tell my health care provider before I take this medicine? They need to know if you have any of these conditions: -blood clotting disorders or history of blood clots -cancer patient not on chemotherapy -cystic fibrosis -heart disease, such as angina, heart failure, or a history of a heart attack -hemoglobin level of 12 g/dL or greater -high blood pressure -low levels of folate, iron, or vitamin B12 -seizures -an unusual or allergic reaction to darbepoetin, erythropoietin, albumin, hamster proteins, latex, other medicines, foods, dyes, or preservatives -pregnant or trying to get pregnant -breast-feeding How should I use this medicine? This medicine is for injection into a vein or under the skin. It is usually given by a health care professional in a hospital or clinic setting. If you get this medicine at home, you will be taught how to prepare and give this medicine. Do not shake the solution before you withdraw a dose. Use exactly as directed. Take your medicine at regular intervals. Do not take your medicine more often than directed. It is important that you put your used needles and syringes in a special sharps container. Do not put them in a trash can. If you do not have a sharps container, call your pharmacist or healthcare provider to get one. Talk to your pediatrician regarding the use of this medicine in children. While this medicine may be used in children as young as 1 year for selected conditions, precautions do apply. Overdosage: If you think you have taken too much of this medicine contact a poison control center or  emergency room at once. NOTE: This medicine is only for you. Do not share this medicine with others. What if I miss a dose? If you miss a dose, take it as soon as you can. If it is almost time for your next dose, take only that dose. Do not take double or extra doses. What may interact with this medicine? Do not take this medicine with any of the following medications: -epoetin alfa This list may not describe all possible interactions. Give your health care provider a list of all the medicines, herbs, non-prescription drugs, or dietary supplements you use. Also tell them if you smoke, drink alcohol, or use illegal drugs. Some items may interact with your medicine. What should I watch for while using this medicine? Visit your prescriber or health care professional for regular checks on your progress and for the needed blood tests and blood pressure measurements. It is especially important for the doctor to make sure your hemoglobin level is in the desired range, to limit the risk of potential side effects and to give you the best benefit. Keep all appointments for any recommended tests. Check your blood pressure as directed. Ask your doctor what your blood pressure should be and when you should contact him or her. As your body makes more red blood cells, you may need to take iron, folic acid, or vitamin B supplements. Ask your doctor or health care provider which products are right for you. If you have kidney disease continue dietary restrictions, even though this medication can make you feel better. Talk with your doctor or health   care professional about the foods you eat and the vitamins that you take. What side effects may I notice from receiving this medicine? Side effects that you should report to your doctor or health care professional as soon as possible: -allergic reactions like skin rash, itching or hives, swelling of the face, lips, or tongue -breathing problems -changes in vision -chest  pain -confusion, trouble speaking or understanding -feeling faint or lightheaded, falls -high blood pressure -muscle aches or pains -pain, swelling, warmth in the leg -rapid weight gain -severe headaches -sudden numbness or weakness of the face, arm or leg -trouble walking, dizziness, loss of balance or coordination -seizures (convulsions) -swelling of the ankles, feet, hands -unusually weak or tired Side effects that usually do not require medical attention (report to your doctor or health care professional if they continue or are bothersome): -diarrhea -fever, chills (flu-like symptoms) -headaches -nausea, vomiting -redness, stinging, or swelling at site where injected This list may not describe all possible side effects. Call your doctor for medical advice about side effects. You may report side effects to FDA at 1-800-FDA-1088. Where should I keep my medicine? Keep out of the reach of children. Store in a refrigerator between 2 and 8 degrees C (36 and 46 degrees F). Do not freeze. Do not shake. Throw away any unused portion if using a single-dose vial. Throw away any unused medicine after the expiration date. NOTE: This sheet is a summary. It may not cover all possible information. If you have questions about this medicine, talk to your doctor, pharmacist, or health care provider.  2015, Elsevier/Gold Standard. (2008-09-23 10:23:57)  

## 2014-07-28 NOTE — Progress Notes (Signed)
[place]  Telephone:(336) [phone] Fax:(336) [phone]  ID: Jaquavia Pingel OB: [birth year] MR#: [id number] CSN#:[id number] Patient Care Team: Omer Jack, MD as PCP - General (Internal Medicine)  DIAGNOSIS: Chronic pancytopenia.  Status post liver transplant for autoimmune hepatitis.  Renal insufficiency with anemia.  INTERVAL HISTORY: [name] is here today for a follow-up. She is doing very well. Her daughter just arrived for 3 weeks from [place]. She is enjoying spending every minute with her before she has to go back. She has had no issues and is symptomatic at today's visit. She denies fever, chills, n/v, cough, rash, headache, dizziness, SOB, chest pain, palpitations, abdominal pain, constipation, diarrhea, blood in urine or stool. No swelling, tenderness, numbness or tingling. No bleeding or pain. Her appetite is good and she is staying hydrated. She has a yearly follow-up appointment with Gaspar Cola on Oct. 29th.   CURRENT TREATMENT: Aranesp 300 mcg subcu as needed for hemoglobin less than 10.  REVIEW OF SYSTEMS: All other 10 point review of systems is negative.  PAST MEDICAL HISTORY: Past Medical History  Diagnosis Date  . Hypertension   . Hepatitis, autoimmune 12/08/2011    PAST SURGICAL HISTORY: Past Surgical History  Procedure Laterality Date  . Liver transplant      1996  . Tonsillectomy    . Abdominal hysterectomy     FAMILY HISTORY Family History  Problem Relation Age of Onset  . Stroke Mother   . Cancer Father    GYNECOLOGIC HISTORY:  No LMP recorded. Patient has had a hysterectomy.   SOCIAL HISTORY:  History   Social History  . Marital Status: Single    Spouse Name: N/A    Number of Children: N/A  . Years of Education: N/A   Occupational History  . Not on file.   Social History Main Topics  . Smoking status: Never Smoker   . Smokeless tobacco: Never Used     Comment: never used tobacco  . Alcohol Use: Yes  . Drug Use: No   . Sexual Activity: Not on file   Other Topics Concern  . Not on file   Social History Narrative  . No narrative on file   ADVANCED DIRECTIVES: <no information>  HEALTH MAINTENANCE: History  Substance Use Topics  . Smoking status: Never Smoker   . Smokeless tobacco: Never Used     Comment: never used tobacco  . Alcohol Use: Yes   Colonoscopy: PAP: Bone density: Lipid panel:  No Known Allergies  Current Outpatient Prescriptions  Medication Sig Dispense Refill  . amLODipine (NORVASC) 10 MG tablet Take 10 mg by mouth daily.        . Ascorbic Acid (VITAMIN C) 250 MG CHEW Chew by mouth every morning.      . calcitonin, salmon, (MIACALCIN/FORTICAL) 200 UNIT/ACT nasal spray Place 1 spray into alternate nostrils daily.      . Calcium Carbonate-Vit D-Min (CALCIUM 1200 PO) Take by mouth 2 (two) times daily.      . cycloSPORINE modified (NEORAL) 25 MG capsule Take 25 mg by mouth 2 (two) times daily. 3 tabs bid      . diclofenac sodium (VOLTAREN) 1 % GEL Apply 2 g topically 4 (four) times daily.  3 Tube  2  . HYDROCHLOROTHIAZIDE PO Take 25 mg by mouth daily. Takes 12.5 mg.      . metoprolol succinate (TOPROL-XL) 25 MG 24 hr tablet Take 25 mg by mouth daily.      . Multiple Vitamins-Minerals (MULTIVITAMIN  WITH MINERALS) tablet Take 1 tablet by mouth daily.        . ondansetron (ZOFRAN) 4 MG tablet Take 4 mg by mouth every 4 (four) hours as needed for nausea or vomiting.      Marland Kitchen oxyCODONE (ROXICODONE) 15 MG immediate release tablet Take 15 mg by mouth every 6 (six) hours as needed.      . Vitamin D, Ergocalciferol, (DRISDOL) 50000 UNITS CAPS Take 50,000 Units by mouth every 7 (seven) days.      Marland Kitchen zolpidem (AMBIEN) 5 MG tablet Take 5 mg by mouth at bedtime as needed for sleep.      . promethazine (PHENERGAN) 12.5 MG tablet Take 1 tablet (12.5 mg total) by mouth every 6 (six) hours as needed.  90 tablet  6   No current facility-administered medications for this visit.   OBJECTIVE: Filed  Vitals:   07/28/14 1401  BP: 135/78  Pulse: 71  Temp: 98.3 F (36.8 C)  Resp: 14   Body mass index is 30.86 kg/(m^2). ECOG FS:0 - Asymptomatic Ocular: Sclerae unicteric, pupils equal, round and reactive to light Ear-nose-throat: Oropharynx clear, dentition fair Lymphatic: No cervical or supraclavicular adenopathy Lungs no rales or rhonchi, good excursion bilaterally Heart regular rate and rhythm, no murmur appreciated Abd soft, nontender, positive bowel sounds MSK no focal spinal tenderness, no joint edema Neuro: non-focal, well-oriented, appropriate affect Breasts: Deferred  LAB RESULTS: CMP     Component Value Date/Time   NA 140 05/28/2014 1451   NA 137 11/25/2013 1155   K 5.2* 05/28/2014 1451   K 4.9 11/25/2013 1155   CL 105 05/28/2014 1451   CL 106 11/25/2013 1155   CO2 23 05/28/2014 1451   CO2 22 11/25/2013 1155   GLUCOSE 79 05/28/2014 1451   GLUCOSE 77 11/25/2013 1155   BUN 30* 05/28/2014 1451   BUN 30* 11/25/2013 1155   CREATININE 1.8* 05/28/2014 1451   CREATININE 1.31* 11/25/2013 1155   CALCIUM 8.2 05/28/2014 1451   CALCIUM 8.8 11/25/2013 1155   PROT 8.7* 05/28/2014 1451   PROT 7.9 11/25/2013 1155   ALBUMIN 3.6 11/25/2013 1155   AST 23 05/28/2014 1451   AST 15 11/25/2013 1155   ALT 15 05/28/2014 1451   ALT <8 11/25/2013 1155   ALKPHOS 77 05/28/2014 1451   ALKPHOS 64 11/25/2013 1155   BILITOT 0.50 05/28/2014 1451   BILITOT 0.5 11/25/2013 1155   No results found for this basename: SPEP, UPEP,  kappa and lambda light chains   Lab Results  Component Value Date   WBC 2.7* 07/28/2014   NEUTROABS 1.6 07/28/2014   HGB 9.2* 07/28/2014   HCT 27.4* 07/28/2014   MCV 91 07/28/2014   PLT 116* 07/28/2014   No results found for this basename: LABCA2   No components found with this basename: LABCA125   No results found for this basename: INR,  in the last 168 hours Urinalysis No results found for this basename: colorurine, appearanceur, labspec, phurine, glucoseu, hgbur, bilirubinur, ketonesur, proteinur,  urobilinogen, nitrite, leukocytesur   STUDIES: No results found.  ASSESSMENT/PLAN: Tami [name] is 58 year old African female. She has a history of liver transplant secondary to autoimmune hepatitis. She's done very well with this. There has been no evidence of rejection at this point. She is asymptomatic at this time.  Her Hgb today is 9.2. We will give her Aranesp.  We will wait and see what the rest of her labs show.  We will see her back in 2 months for  labs and follow-up. She knows to call here with any questions or concerns and to go to the ED in the event of an emergency. We can certainly see her back sooner if need be.    Eliezer Bottom, NP 07/28/2014 5:23 PM

## 2014-09-22 ENCOUNTER — Ambulatory Visit: Payer: Medicaid Other | Admitting: Hematology & Oncology

## 2014-09-22 ENCOUNTER — Ambulatory Visit: Payer: Medicaid Other

## 2014-09-22 ENCOUNTER — Telehealth: Payer: Self-pay | Admitting: Hematology & Oncology

## 2014-09-22 ENCOUNTER — Other Ambulatory Visit: Payer: Medicaid Other | Admitting: Lab

## 2014-09-22 NOTE — Telephone Encounter (Signed)
Left message to call and reschedule missed 11-30

## 2014-09-29 ENCOUNTER — Telehealth: Payer: Self-pay | Admitting: Hematology & Oncology

## 2014-09-29 NOTE — Telephone Encounter (Signed)
Pt made 12-11 appointment

## 2014-10-03 ENCOUNTER — Encounter: Payer: Self-pay | Admitting: Family

## 2014-10-03 ENCOUNTER — Ambulatory Visit (HOSPITAL_BASED_OUTPATIENT_CLINIC_OR_DEPARTMENT_OTHER): Payer: Medicaid Other | Admitting: Family

## 2014-10-03 ENCOUNTER — Other Ambulatory Visit (HOSPITAL_BASED_OUTPATIENT_CLINIC_OR_DEPARTMENT_OTHER): Payer: Medicaid Other | Admitting: Lab

## 2014-10-03 ENCOUNTER — Other Ambulatory Visit: Payer: Self-pay | Admitting: *Deleted

## 2014-10-03 ENCOUNTER — Ambulatory Visit (HOSPITAL_BASED_OUTPATIENT_CLINIC_OR_DEPARTMENT_OTHER): Payer: Medicaid Other

## 2014-10-03 DIAGNOSIS — D631 Anemia in chronic kidney disease: Secondary | ICD-10-CM

## 2014-10-03 DIAGNOSIS — N189 Chronic kidney disease, unspecified: Secondary | ICD-10-CM

## 2014-10-03 DIAGNOSIS — D61818 Other pancytopenia: Secondary | ICD-10-CM

## 2014-10-03 DIAGNOSIS — K754 Autoimmune hepatitis: Secondary | ICD-10-CM

## 2014-10-03 LAB — CBC WITH DIFFERENTIAL (CANCER CENTER ONLY)
BASO#: 0 10*3/uL (ref 0.0–0.2)
BASO%: 0.3 % (ref 0.0–2.0)
EOS ABS: 0.1 10*3/uL (ref 0.0–0.5)
EOS%: 1.7 % (ref 0.0–7.0)
HCT: 29.4 % — ABNORMAL LOW (ref 34.8–46.6)
HEMOGLOBIN: 9.8 g/dL — AB (ref 11.6–15.9)
LYMPH#: 1 10*3/uL (ref 0.9–3.3)
LYMPH%: 27.9 % (ref 14.0–48.0)
MCH: 30.2 pg (ref 26.0–34.0)
MCHC: 33.3 g/dL (ref 32.0–36.0)
MCV: 91 fL (ref 81–101)
MONO#: 0.5 10*3/uL (ref 0.1–0.9)
MONO%: 12.9 % (ref 0.0–13.0)
NEUT%: 57.2 % (ref 39.6–80.0)
NEUTROS ABS: 2 10*3/uL (ref 1.5–6.5)
PLATELETS: 119 10*3/uL — AB (ref 145–400)
RBC: 3.24 10*6/uL — AB (ref 3.70–5.32)
RDW: 12.8 % (ref 11.1–15.7)
WBC: 3.5 10*3/uL — ABNORMAL LOW (ref 3.9–10.0)

## 2014-10-03 LAB — COMPREHENSIVE METABOLIC PANEL
ALK PHOS: 73 U/L (ref 39–117)
ALT: 12 U/L (ref 0–35)
AST: 20 U/L (ref 0–37)
Albumin: 3.8 g/dL (ref 3.5–5.2)
BUN: 35 mg/dL — ABNORMAL HIGH (ref 6–23)
CALCIUM: 8.6 mg/dL (ref 8.4–10.5)
CO2: 20 mEq/L (ref 19–32)
Chloride: 105 mEq/L (ref 96–112)
Creatinine, Ser: 2.08 mg/dL — ABNORMAL HIGH (ref 0.50–1.10)
GLUCOSE: 83 mg/dL (ref 70–99)
Potassium: 4.7 mEq/L (ref 3.5–5.3)
SODIUM: 136 meq/L (ref 135–145)
TOTAL PROTEIN: 8.4 g/dL — AB (ref 6.0–8.3)
Total Bilirubin: 0.6 mg/dL (ref 0.2–1.2)

## 2014-10-03 LAB — RETICULOCYTES (CHCC)
ABS Retic: 19.5 10*3/uL (ref 19.0–186.0)
RBC.: 3.25 MIL/uL — ABNORMAL LOW (ref 3.87–5.11)
Retic Ct Pct: 0.6 % (ref 0.4–2.3)

## 2014-10-03 MED ORDER — HYDROCHLOROTHIAZIDE 12.5 MG PO TABS: 25.0000 mg | ORAL_TABLET | Freq: Every day | ORAL | Status: DC

## 2014-10-03 MED ORDER — DARBEPOETIN ALFA 300 MCG/0.6ML IJ SOSY
PREFILLED_SYRINGE | INTRAMUSCULAR | Status: AC
  Filled 2014-10-03: qty 0.6

## 2014-10-03 MED ORDER — DARBEPOETIN ALFA 300 MCG/0.6ML IJ SOSY
300.0000 ug | PREFILLED_SYRINGE | Freq: Once | INTRAMUSCULAR | Status: AC
  Administered 2014-10-03: 300 ug via SUBCUTANEOUS

## 2014-10-03 MED ORDER — ZOLPIDEM TARTRATE 5 MG PO TABS: 5.0000 mg | ORAL_TABLET | Freq: Every evening | ORAL | Status: DC | PRN

## 2014-10-03 NOTE — Progress Notes (Signed)
[place]  Telephone:(336) [phone] Fax:(336) [phone]  ID: Anniebelle [name] OB: 02-Jul-1956 MR#: [id number] Patient Care Team: Tami Jack, MD as PCP - General (Internal Medicine)  DIAGNOSIS: Chronic pancytopenia.  Status post liver transplant for autoimmune hepatitis.  Renal insufficiency with anemia.  INTERVAL HISTORY: Tami [name] is here today for a follow-up. She is doing ok. She has been under some stress at home and hasn't been sleeping. She is feeling tired. Her daughter works in [place] and has been in danger. Her company was attacked by terrorists and employees were forced to hide in bunkers for several days. She really wants her to come home.   She denies fever, chills, n/v, cough, rash, headache, dizziness, SOB, chest pain, palpitations, abdominal pain, constipation, diarrhea, blood in urine or stool. No bleeding or pain. No swelling, tenderness, numbness or tingling.   Her appetite is good and she is staying hydrated.  She has a yearly follow-up appointment with Tami [name] next Thursday.   CURRENT TREATMENT: Aranesp 300 mcg subcu as needed for hemoglobin less than 10.  REVIEW OF SYSTEMS: All other 10 point review of systems is negative.  PAST MEDICAL HISTORY: Past Medical History  Diagnosis Date  . Hypertension   . Hepatitis, autoimmune 12/08/2011    PAST SURGICAL HISTORY: Past Surgical History  Procedure Laterality Date  . Liver transplant      1996  . Tonsillectomy    . Abdominal hysterectomy     FAMILY HISTORY Family History  Problem Relation Age of Onset  . Stroke Mother   . Cancer Father    GYNECOLOGIC HISTORY:  No LMP recorded. Patient has had a hysterectomy.   SOCIAL HISTORY:  History   Social History  . Marital Status: Single    Spouse Name: N/A    Number of Children: N/A  . Years of Education: N/A   Occupational History  . Not on file.   Social History Main Topics  . Smoking status: Never Smoker   .  Smokeless tobacco: Never Used     Comment: never used tobacco  . Alcohol Use: Yes  . Drug Use: No  . Sexual Activity: Not on file   Other Topics Concern  . Not on file   Social History Narrative   ADVANCED DIRECTIVES: <no information>  HEALTH MAINTENANCE: History  Substance Use Topics  . Smoking status: Never Smoker   . Smokeless tobacco: Never Used     Comment: never used tobacco  . Alcohol Use: Yes   Colonoscopy: PAP: Bone density: Lipid panel:  No Known Allergies  Current Outpatient Prescriptions  Medication Sig Dispense Refill  . amLODipine (NORVASC) 10 MG tablet Take 10 mg by mouth daily.      . Ascorbic Acid (VITAMIN C) 250 MG CHEW Chew by mouth every morning.    . calcitonin, salmon, (MIACALCIN/FORTICAL) 200 UNIT/ACT nasal spray Place 1 spray into alternate nostrils daily.    . Calcium Carbonate-Vit D-Min (CALCIUM 1200 PO) Take by mouth 2 (two) times daily.    . cycloSPORINE modified (NEORAL) 25 MG capsule Take 25 mg by mouth 2 (two) times daily. 3 tabs bid    . diclofenac sodium (VOLTAREN) 1 % GEL Apply 2 g topically 4 (four) times daily. 3 Tube 2  . HYDROCHLOROTHIAZIDE PO Take 25 mg by mouth daily.     . metoprolol succinate (TOPROL-XL) 25 MG 24 hr tablet Take 25 mg by mouth daily.    . Multiple Vitamins-Minerals (MULTIVITAMIN WITH MINERALS) tablet Take 1  tablet by mouth daily.      . ondansetron (ZOFRAN) 4 MG tablet Take 4 mg by mouth every 4 (four) hours as needed for nausea or vomiting.    Marland Kitchen oxyCODONE (ROXICODONE) 15 MG immediate release tablet Take 15 mg by mouth every 6 (six) hours as needed.    . promethazine (PHENERGAN) 12.5 MG tablet Take 1 tablet (12.5 mg total) by mouth every 6 (six) hours as needed. 90 tablet 6  . Vitamin D, Ergocalciferol, (DRISDOL) 50000 UNITS CAPS Take 50,000 Units by mouth every 7 (seven) days.    Marland Kitchen zolpidem (AMBIEN) 5 MG tablet Take 5 mg by mouth at bedtime as needed for sleep.     No current facility-administered medications for  this visit.   OBJECTIVE: Filed Vitals:   10/03/14 1522  BP: 123/72  Pulse: 72  Temp: 98.3 F (36.8 C)  Resp: 14   Body mass index is 29.49 kg/(m^2). ECOG FS:1 - Symptomatic but completely ambulatory Ocular: Sclerae unicteric, pupils equal, round and reactive to light Ear-nose-throat: Oropharynx clear, dentition fair Lymphatic: No cervical or supraclavicular adenopathy Lungs no rales or rhonchi, good excursion bilaterally Heart regular rate and rhythm, no murmur appreciated Abd soft, nontender, positive bowel sounds MSK no focal spinal tenderness, no joint edema Neuro: non-focal, well-oriented, appropriate affect Breasts: Deferred  LAB RESULTS: CMP     Component Value Date/Time   NA 134* 07/28/2014 1323   NA 140 05/28/2014 1451   K 5.2 07/28/2014 1323   K 5.2* 05/28/2014 1451   CL 107 07/28/2014 1323   CL 105 05/28/2014 1451   CO2 20 07/28/2014 1323   CO2 23 05/28/2014 1451   GLUCOSE 88 07/28/2014 1323   GLUCOSE 79 05/28/2014 1451   BUN 32* 07/28/2014 1323   BUN 30* 05/28/2014 1451   CREATININE 1.71* 07/28/2014 1323   CREATININE 1.8* 05/28/2014 1451   CALCIUM 8.4 07/28/2014 1323   CALCIUM 8.2 05/28/2014 1451   PROT 8.4* 07/28/2014 1323   PROT 8.7* 05/28/2014 1451   ALBUMIN 3.8 07/28/2014 1323   AST 16 07/28/2014 1323   AST 23 05/28/2014 1451   ALT 8 07/28/2014 1323   ALT 15 05/28/2014 1451   ALKPHOS 80 07/28/2014 1323   ALKPHOS 77 05/28/2014 1451   BILITOT 0.5 07/28/2014 1323   BILITOT 0.50 05/28/2014 1451   No results found for: SPEP Lab Results  Component Value Date   WBC 3.5* 10/03/2014   NEUTROABS 2.0 10/03/2014   HGB 9.8* 10/03/2014   HCT 29.4* 10/03/2014   MCV 91 10/03/2014   PLT 119* 10/03/2014   No results found for: LABCA2 No components found for: [id number] No results for input(s): INR in the last 168 hours. Urinalysis No results found for: COLORURINE STUDIES: No results found.  ASSESSMENT/PLAN: [name] is 58 year old African female. She  has a history of liver transplant secondary to autoimmune hepatitis. She's done very well. There has been no sign of rejection. She is feeling tired and is under some stress at home. Her Hgb today is 9.8 so we will give her Aranesp.  We will see what the rest of her labs show.  I refilled her prescriptions for Ambien and Hydrochlorothiazide.  We will see her back in 3 months for labs and follow-up. She knows to call here with any questions or concerns and to go to the ED in the event of an emergency. We can certainly see her back sooner if need be.    Eliezer Bottom, NP 10/03/2014 4:04 PM

## 2014-10-06 LAB — FERRITIN CHCC: Ferritin: 959 ng/mL — ABNORMAL HIGH (ref 9–269)

## 2014-10-06 LAB — IRON AND TIBC CHCC
%SAT: 71 % — AB (ref 21–57)
Iron: 113 ug/dL (ref 41–142)
TIBC: 160 ug/dL — AB (ref 236–444)
UIBC: 47 ug/dL — ABNORMAL LOW (ref 120–384)

## 2014-10-22 DIAGNOSIS — Z5181 Encounter for therapeutic drug level monitoring: Secondary | ICD-10-CM | POA: Insufficient documentation

## 2014-11-27 ENCOUNTER — Other Ambulatory Visit (HOSPITAL_BASED_OUTPATIENT_CLINIC_OR_DEPARTMENT_OTHER): Payer: Self-pay | Admitting: Internal Medicine

## 2014-11-27 DIAGNOSIS — Z1231 Encounter for screening mammogram for malignant neoplasm of breast: Secondary | ICD-10-CM

## 2014-12-08 ENCOUNTER — Ambulatory Visit (HOSPITAL_BASED_OUTPATIENT_CLINIC_OR_DEPARTMENT_OTHER): Payer: Medicaid Other

## 2014-12-26 ENCOUNTER — Ambulatory Visit (HOSPITAL_BASED_OUTPATIENT_CLINIC_OR_DEPARTMENT_OTHER): Payer: Medicaid Other

## 2014-12-26 ENCOUNTER — Other Ambulatory Visit (HOSPITAL_BASED_OUTPATIENT_CLINIC_OR_DEPARTMENT_OTHER): Payer: Medicaid Other | Admitting: Lab

## 2014-12-26 ENCOUNTER — Ambulatory Visit (HOSPITAL_BASED_OUTPATIENT_CLINIC_OR_DEPARTMENT_OTHER): Payer: Medicaid Other | Admitting: Family

## 2014-12-26 ENCOUNTER — Encounter: Payer: Self-pay | Admitting: Family

## 2014-12-26 VITALS — BP 134/74 | HR 87 | Temp 98.6°F | Resp 14 | Ht 60.0 in | Wt 150.0 lb

## 2014-12-26 DIAGNOSIS — N189 Chronic kidney disease, unspecified: Principal | ICD-10-CM

## 2014-12-26 DIAGNOSIS — K754 Autoimmune hepatitis: Secondary | ICD-10-CM

## 2014-12-26 DIAGNOSIS — D61818 Other pancytopenia: Secondary | ICD-10-CM

## 2014-12-26 DIAGNOSIS — D631 Anemia in chronic kidney disease: Secondary | ICD-10-CM

## 2014-12-26 DIAGNOSIS — Z944 Liver transplant status: Secondary | ICD-10-CM

## 2014-12-26 LAB — CBC WITH DIFFERENTIAL (CANCER CENTER ONLY)
BASO#: 0 10*3/uL (ref 0.0–0.2)
BASO%: 0.3 % (ref 0.0–2.0)
EOS ABS: 0.1 10*3/uL (ref 0.0–0.5)
EOS%: 2.9 % (ref 0.0–7.0)
HCT: 26.8 % — ABNORMAL LOW (ref 34.8–46.6)
HEMOGLOBIN: 8.7 g/dL — AB (ref 11.6–15.9)
LYMPH#: 0.6 10*3/uL — ABNORMAL LOW (ref 0.9–3.3)
LYMPH%: 19.7 % (ref 14.0–48.0)
MCH: 30.6 pg (ref 26.0–34.0)
MCHC: 32.5 g/dL (ref 32.0–36.0)
MCV: 94 fL (ref 81–101)
MONO#: 0.3 10*3/uL (ref 0.1–0.9)
MONO%: 10.4 % (ref 0.0–13.0)
NEUT#: 2.1 10*3/uL (ref 1.5–6.5)
NEUT%: 66.7 % (ref 39.6–80.0)
PLATELETS: 142 10*3/uL — AB (ref 145–400)
RBC: 2.84 10*6/uL — ABNORMAL LOW (ref 3.70–5.32)
RDW: 14.2 % (ref 11.1–15.7)
WBC: 3.1 10*3/uL — ABNORMAL LOW (ref 3.9–10.0)

## 2014-12-26 LAB — RETICULOCYTES (CHCC)
ABS Retic: 47.4 10*3/uL (ref 19.0–186.0)
RBC.: 2.96 MIL/uL — AB (ref 3.87–5.11)
Retic Ct Pct: 1.6 % (ref 0.4–2.3)

## 2014-12-26 LAB — CMP (CANCER CENTER ONLY)
ALT(SGPT): 18 U/L (ref 10–47)
AST: 29 U/L (ref 11–38)
Albumin: 3.3 g/dL (ref 3.3–5.5)
Alkaline Phosphatase: 76 U/L (ref 26–84)
BUN: 48 mg/dL — AB (ref 7–22)
CALCIUM: 8.6 mg/dL (ref 8.0–10.3)
CO2: 22 meq/L (ref 18–33)
CREATININE: 2.3 mg/dL — AB (ref 0.6–1.2)
Chloride: 102 mEq/L (ref 98–108)
Glucose, Bld: 101 mg/dL (ref 73–118)
Potassium: 4.9 mEq/L — ABNORMAL HIGH (ref 3.3–4.7)
Sodium: 140 mEq/L (ref 128–145)
Total Bilirubin: 0.6 mg/dl (ref 0.20–1.60)
Total Protein: 9 g/dL — ABNORMAL HIGH (ref 6.4–8.1)

## 2014-12-26 MED ORDER — DARBEPOETIN ALFA 300 MCG/0.6ML IJ SOSY
PREFILLED_SYRINGE | INTRAMUSCULAR | Status: AC
  Filled 2014-12-26: qty 0.6

## 2014-12-26 MED ORDER — DARBEPOETIN ALFA 300 MCG/0.6ML IJ SOSY
300.0000 ug | PREFILLED_SYRINGE | Freq: Once | INTRAMUSCULAR | Status: AC
  Administered 2014-12-26: 300 ug via SUBCUTANEOUS

## 2014-12-26 NOTE — Progress Notes (Signed)
Hematology and Oncology Follow Up Visit  Ironesha Rothe [id number] 11/01/1955 59 y.o. 12/26/2014  Principle Diagnosis:  Chronic pancytopenia.  Status post liver transplant for autoimmune hepatitis.  Renal insufficiency with anemia.  Current Therapy:   Aranesp 300 mcg subcu as needed for hemoglobin less than 10.    Interim History: Ms. Gemelli is here today for a follow-up. She is feeling muche better. She is very excited today because her daughter is moving back here for good. This alleviates a lot of stress from her so she is sleeping much better.   She denies fever, chills, n/v, cough, rash, headache, dizziness, SOB, chest pain, palpitations, abdominal pain, constipation, diarrhea, blood in urine or stool. No episodes of bleeding.  No numbness or tingling in her extremities. 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 She states that this is being managed by her PCP and is getting better.   Her appetite is good and she is staying hydrated. Her weight is stable.  She still follows [facility] with [facility].   Medications:    Medication List       This list is accurate as of: 12/26/14  3:49 PM.  Always use your most recent med list.               amLODipine 10 MG tablet  Commonly known as:  NORVASC  Take 10 mg by mouth daily.     calcitonin (salmon) 200 UNIT/ACT nasal spray  Commonly known as:  MIACALCIN/FORTICAL  Place 1 spray into alternate nostrils daily.     CALCIUM 1200 PO  Take by mouth 2 (two) times daily.     diclofenac sodium 1 % Gel  Commonly known as:  VOLTAREN  Apply 2 g topically 4 (four) times daily.     hydrochlorothiazide 12.5 MG tablet  Commonly known as:  HYDRODIURIL  Take 2 tablets (25 mg total) by mouth daily.     metoprolol succinate 25 MG 24 hr tablet  Commonly known as:  TOPROL-XL  Take 25 mg by mouth daily.     multivitamin with minerals tablet  Take 1 tablet by mouth daily.     NEORAL 25 MG capsule  Generic drug:  cycloSPORINE  modified  Take 25 mg by mouth 2 (two) times daily. 3 tabs bid     ondansetron 4 MG tablet  Commonly known as:  ZOFRAN  Take 4 mg by mouth every 4 (four) hours as needed for nausea or vomiting.     oxyCODONE 15 MG immediate release tablet  Commonly known as:  ROXICODONE  Take 15 mg by mouth every 6 (six) hours as needed.     promethazine 12.5 MG tablet  Commonly known as:  PHENERGAN  Take 1 tablet (12.5 mg total) by mouth every 6 (six) hours as needed.     Vitamin C 250 MG Chew  Chew by mouth every morning.     Vitamin D (Ergocalciferol) 50000 UNITS Caps capsule  Commonly known as:  DRISDOL  Take 50,000 Units by mouth every 7 (seven) days.     zolpidem 5 MG tablet  Commonly known as:  AMBIEN  Take 1 tablet (5 mg total) by mouth at bedtime as needed for sleep.       Allergies: No Known Allergies  Past Medical History, Surgical history, Social history, and Family History were reviewed and updated.  Review of Systems: All other 10 point review of systems is negative.   Physical Exam:  height is 5' (  1.524 m) and weight is 150 lb (68.04 kg). Her oral temperature is 98.6 F (37 C). Her blood pressure is 134/74 and her pulse is 87. Her respiration is 14.   Wt Readings from Last 3 Encounters:  12/26/14 150 lb (68.04 kg)  10/03/14 151 lb (68.493 kg)  07/28/14 158 lb (71.668 kg)    Ocular: Sclerae unicteric, pupils equal, round and reactive to light Ear-nose-throat: Oropharynx clear, dentition fair Lymphatic: No cervical or supraclavicular adenopathy Lungs no rales or rhonchi, good excursion bilaterally Heart regular rate and rhythm, no murmur appreciated Abd soft, nontender, positive bowel sounds MSK no focal spinal tenderness, no joint edema Neuro: non-focal, well-oriented, appropriate affect Breasts: Deferred  Lab Results  Component Value Date   WBC 3.1* 12/26/2014   HGB 8.7* 12/26/2014   HCT 26.8* 12/26/2014   MCV 94 12/26/2014   PLT 142* 12/26/2014   Lab  Results  Component Value Date   FERRITIN 959* 10/03/2014   IRON 113 10/03/2014   TIBC 160* 10/03/2014   UIBC 47* 10/03/2014   IRONPCTSAT 71* 10/03/2014   Lab Results  Component Value Date   RETICCTPCT 0.6 10/03/2014   RBC 2.84* 12/26/2014   RETICCTABS 19.5 10/03/2014   No results found for: KPAFRELGTCHN, LAMBDASER, KAPLAMBRATIO No results found for: Kandis Cocking [facility] Lab Results  Component Value Date   TOTALPROTELP 9.4* 11/19/2008   ALBUMINELP 41.1* 11/19/2008   A1GS 4.0 11/19/2008   A2GS 7.1 11/19/2008   BETS 4.0* 11/19/2008   BETA2SER 7.1* 11/19/2008   GAMS 36.7* 11/19/2008   MSPIKE NOT DET 11/19/2008   SPEI * 11/19/2008     Chemistry      Component Value Date/Time   NA 140 12/26/2014 1445   NA 136 10/03/2014 1512   K 4.9* 12/26/2014 1445   K 4.7 10/03/2014 1512   CL 102 12/26/2014 1445   CL 105 10/03/2014 1512   CO2 22 12/26/2014 1445   CO2 20 10/03/2014 1512   BUN 48* 12/26/2014 1445   BUN 35* 10/03/2014 1512   CREATININE 2.3* 12/26/2014 1445   CREATININE 2.08* 10/03/2014 1512      Component Value Date/Time   CALCIUM 8.6 12/26/2014 1445   CALCIUM 8.6 10/03/2014 1512   ALKPHOS 76 12/26/2014 1445   ALKPHOS 73 10/03/2014 1512   AST 29 12/26/2014 1445   AST 20 10/03/2014 1512   ALT 18 12/26/2014 1445   ALT 12 10/03/2014 1512   BILITOT 0.60 12/26/2014 1445   BILITOT 0.6 10/03/2014 1512     Impression and Plan: Ms. Griffy is 59 year old African female with a history of liver transplant secondary to autoimmune hepatitis. She has done very well. There has been no evidence of rejection. She is feeling much better and is excited for her daughter to be moving home.  Her Hgb today is 8.7 so we will give her Aranesp. We will see what her iron studies show.  We will see her back in 2 months for labs and follow-up. She knows to call here with any questions or concerns and to go to the ED in the event of an emergency. We can certainly see her back sooner if need  be.   Eliezer Bottom, NP 3/4/20163:49 PM

## 2014-12-26 NOTE — Patient Instructions (Signed)
Darbepoetin Alfa injection What is this medicine? DARBEPOETIN ALFA (dar be POE e tin AL fa) helps your body make more red blood cells. It is used to treat anemia caused by chronic kidney failure and chemotherapy. This medicine may be used for other purposes; ask your health care provider or pharmacist if you have questions. COMMON BRAND NAME(S): Aranesp What should I tell my health care provider before I take this medicine? They need to know if you have any of these conditions: -blood clotting disorders or history of blood clots -cancer patient not on chemotherapy -cystic fibrosis -heart disease, such as angina, heart failure, or a history of a heart attack -hemoglobin level of 12 g/dL or greater -high blood pressure -low levels of folate, iron, or vitamin B12 -seizures -an unusual or allergic reaction to darbepoetin, erythropoietin, albumin, hamster proteins, latex, other medicines, foods, dyes, or preservatives -pregnant or trying to get pregnant -breast-feeding How should I use this medicine? This medicine is for injection into a vein or under the skin. It is usually given by a health care professional in a hospital or clinic setting. If you get this medicine at home, you will be taught how to prepare and give this medicine. Do not shake the solution before you withdraw a dose. Use exactly as directed. Take your medicine at regular intervals. Do not take your medicine more often than directed. It is important that you put your used needles and syringes in a special sharps container. Do not put them in a trash can. If you do not have a sharps container, call your pharmacist or healthcare provider to get one. Talk to your pediatrician regarding the use of this medicine in children. While this medicine may be used in children as young as 1 year for selected conditions, precautions do apply. Overdosage: If you think you have taken too much of this medicine contact a poison control center or  emergency room at once. NOTE: This medicine is only for you. Do not share this medicine with others. What if I miss a dose? If you miss a dose, take it as soon as you can. If it is almost time for your next dose, take only that dose. Do not take double or extra doses. What may interact with this medicine? Do not take this medicine with any of the following medications: -epoetin alfa This list may not describe all possible interactions. Give your health care provider a list of all the medicines, herbs, non-prescription drugs, or dietary supplements you use. Also tell them if you smoke, drink alcohol, or use illegal drugs. Some items may interact with your medicine. What should I watch for while using this medicine? Visit your prescriber or health care professional for regular checks on your progress and for the needed blood tests and blood pressure measurements. It is especially important for the doctor to make sure your hemoglobin level is in the desired range, to limit the risk of potential side effects and to give you the best benefit. Keep all appointments for any recommended tests. Check your blood pressure as directed. Ask your doctor what your blood pressure should be and when you should contact him or her. As your body makes more red blood cells, you may need to take iron, folic acid, or vitamin B supplements. Ask your doctor or health care provider which products are right for you. If you have kidney disease continue dietary restrictions, even though this medication can make you feel better. Talk with your doctor or health   care professional about the foods you eat and the vitamins that you take. What side effects may I notice from receiving this medicine? Side effects that you should report to your doctor or health care professional as soon as possible: -allergic reactions like skin rash, itching or hives, swelling of the face, lips, or tongue -breathing problems -changes in vision -chest  pain -confusion, trouble speaking or understanding -feeling faint or lightheaded, falls -high blood pressure -muscle aches or pains -pain, swelling, warmth in the leg -rapid weight gain -severe headaches -sudden numbness or weakness of the face, arm or leg -trouble walking, dizziness, loss of balance or coordination -seizures (convulsions) -swelling of the ankles, feet, hands -unusually weak or tired Side effects that usually do not require medical attention (report to your doctor or health care professional if they continue or are bothersome): -diarrhea -fever, chills (flu-like symptoms) -headaches -nausea, vomiting -redness, stinging, or swelling at site where injected This list may not describe all possible side effects. Call your doctor for medical advice about side effects. You may report side effects to FDA at 1-800-FDA-1088. Where should I keep my medicine? Keep out of the reach of children. Store in a refrigerator between 2 and 8 degrees C (36 and 46 degrees F). Do not freeze. Do not shake. Throw away any unused portion if using a single-dose vial. Throw away any unused medicine after the expiration date. NOTE: This sheet is a summary. It may not cover all possible information. If you have questions about this medicine, talk to your doctor, pharmacist, or health care provider.  2015, Elsevier/Gold Standard. (2008-09-23 10:23:57)  

## 2014-12-29 LAB — FERRITIN CHCC: Ferritin: 1315 ng/ml — ABNORMAL HIGH (ref 9–269)

## 2014-12-29 LAB — IRON AND TIBC CHCC
%SAT: 39 % (ref 21–57)
Iron: 65 ug/dL (ref 41–142)
TIBC: 165 ug/dL — ABNORMAL LOW (ref 236–444)
UIBC: 100 ug/dL — ABNORMAL LOW (ref 120–384)

## 2015-01-01 ENCOUNTER — Encounter: Payer: Self-pay | Admitting: Hematology & Oncology

## 2015-01-02 ENCOUNTER — Ambulatory Visit: Payer: Medicaid Other

## 2015-01-02 ENCOUNTER — Ambulatory Visit: Payer: Medicaid Other | Admitting: Hematology & Oncology

## 2015-01-02 ENCOUNTER — Other Ambulatory Visit: Payer: Medicaid Other | Admitting: Lab

## 2015-02-27 ENCOUNTER — Other Ambulatory Visit: Payer: Medicaid Other

## 2015-02-27 ENCOUNTER — Ambulatory Visit: Payer: Medicaid Other

## 2015-02-27 ENCOUNTER — Ambulatory Visit: Payer: Medicaid Other | Admitting: Family

## 2015-03-02 ENCOUNTER — Encounter: Payer: Self-pay | Admitting: Family

## 2015-03-02 ENCOUNTER — Ambulatory Visit (HOSPITAL_BASED_OUTPATIENT_CLINIC_OR_DEPARTMENT_OTHER): Payer: Medicaid Other

## 2015-03-02 ENCOUNTER — Ambulatory Visit (HOSPITAL_BASED_OUTPATIENT_CLINIC_OR_DEPARTMENT_OTHER): Payer: Medicaid Other | Admitting: Family

## 2015-03-02 ENCOUNTER — Other Ambulatory Visit (HOSPITAL_BASED_OUTPATIENT_CLINIC_OR_DEPARTMENT_OTHER): Payer: Medicaid Other

## 2015-03-02 VITALS — BP 133/73 | HR 72 | Temp 98.3°F | Resp 14 | Ht 60.0 in | Wt 159.0 lb

## 2015-03-02 DIAGNOSIS — D61818 Other pancytopenia: Secondary | ICD-10-CM | POA: Diagnosis not present

## 2015-03-02 DIAGNOSIS — N189 Chronic kidney disease, unspecified: Secondary | ICD-10-CM

## 2015-03-02 DIAGNOSIS — K754 Autoimmune hepatitis: Secondary | ICD-10-CM

## 2015-03-02 DIAGNOSIS — D631 Anemia in chronic kidney disease: Secondary | ICD-10-CM

## 2015-03-02 LAB — CMP (CANCER CENTER ONLY)
ALBUMIN: 3.3 g/dL (ref 3.3–5.5)
ALT(SGPT): 13 U/L (ref 10–47)
AST: 20 U/L (ref 11–38)
Alkaline Phosphatase: 79 U/L (ref 26–84)
BUN, Bld: 32 mg/dL — ABNORMAL HIGH (ref 7–22)
CO2: 24 mEq/L (ref 18–33)
Calcium: 8.8 mg/dL (ref 8.0–10.3)
Chloride: 101 mEq/L (ref 98–108)
Creat: 2.4 mg/dl — ABNORMAL HIGH (ref 0.6–1.2)
GLUCOSE: 94 mg/dL (ref 73–118)
POTASSIUM: 4.4 meq/L (ref 3.3–4.7)
SODIUM: 135 meq/L (ref 128–145)
TOTAL PROTEIN: 8.9 g/dL — AB (ref 6.4–8.1)
Total Bilirubin: 0.6 mg/dl (ref 0.20–1.60)

## 2015-03-02 LAB — RETICULOCYTES (CHCC)
ABS Retic: 57.8 10*3/uL (ref 19.0–186.0)
RBC.: 2.75 MIL/uL — ABNORMAL LOW (ref 3.87–5.11)
RETIC CT PCT: 2.1 % (ref 0.4–2.3)

## 2015-03-02 LAB — CBC WITH DIFFERENTIAL (CANCER CENTER ONLY)
BASO#: 0 10*3/uL (ref 0.0–0.2)
BASO%: 0 % (ref 0.0–2.0)
EOS%: 4 % (ref 0.0–7.0)
Eosinophils Absolute: 0.1 10*3/uL (ref 0.0–0.5)
HCT: 25.9 % — ABNORMAL LOW (ref 34.8–46.6)
HGB: 8.5 g/dL — ABNORMAL LOW (ref 11.6–15.9)
LYMPH#: 0.6 10*3/uL — AB (ref 0.9–3.3)
LYMPH%: 26.7 % (ref 14.0–48.0)
MCH: 30.9 pg (ref 26.0–34.0)
MCHC: 32.8 g/dL (ref 32.0–36.0)
MCV: 94 fL (ref 81–101)
MONO#: 0.4 10*3/uL (ref 0.1–0.9)
MONO%: 16 % — ABNORMAL HIGH (ref 0.0–13.0)
NEUT#: 1.2 10*3/uL — ABNORMAL LOW (ref 1.5–6.5)
NEUT%: 53.3 % (ref 39.6–80.0)
PLATELETS: 154 10*3/uL (ref 145–400)
RBC: 2.75 10*6/uL — ABNORMAL LOW (ref 3.70–5.32)
RDW: 13.9 % (ref 11.1–15.7)
WBC: 2.3 10*3/uL — AB (ref 3.9–10.0)

## 2015-03-02 MED ORDER — DARBEPOETIN ALFA 300 MCG/0.6ML IJ SOSY
PREFILLED_SYRINGE | INTRAMUSCULAR | Status: AC
  Filled 2015-03-02: qty 0.6

## 2015-03-02 MED ORDER — DARBEPOETIN ALFA 300 MCG/0.6ML IJ SOSY
300.0000 ug | PREFILLED_SYRINGE | Freq: Once | INTRAMUSCULAR | Status: AC
  Administered 2015-03-02: 300 ug via SUBCUTANEOUS

## 2015-03-02 NOTE — Progress Notes (Signed)
Hematology and Oncology Follow Up Visit  Dellar Covarrubias [id number] 24-Mar-1956 59 y.o. 03/02/2015  Principle Diagnosis:  Chronic pancytopenia.  Status post liver transplant for autoimmune hepatitis.  Renal insufficiency with anemia.  Current Therapy:   Aranesp 300 mcg subcu as needed for hemoglobin less than 10.    Interim History: [name] is here today for a follow-up. She is feeling stressed today. She states that at her yearly follow-up appointment at [facility] her BUN and creatinine were significantly elevated. This was determined to be caused by a medication she was taking for a cough, Theraflu. We did check a CMP on her today and her BUN is coming back down, her Creatinine remains at 2.4. She is drinking lots of fluids and making urine. She does not feel "bloated" or like she is holding onto extra fluid.   She has a cough and has had some white phlegm at times. She is going to pick up some Robitussin downstairs after she leaves here today.    She denies fever, chills, n/v, rash, headache, dizziness, SOB, chest pain, palpitations, abdominal pain, constipation, diarrhea, blood in urine or stool. No episodes of bleeding or bruising.  No swelling, tenderness, numbness or tingling in her extremities. No new aches or pains.  Her appetite is good and she is staying hydrated.   Medications:    Medication List       This list is accurate as of: 03/02/15  2:15 PM.  Always use your most recent med list.               amLODipine 10 MG tablet  Commonly known as:  NORVASC  Take 10 mg by mouth daily.     calcitonin (salmon) 200 UNIT/ACT nasal spray  Commonly known as:  MIACALCIN/FORTICAL  Place 1 spray into alternate nostrils daily.     CALCIUM 1200 PO  Take by mouth 2 (two) times daily.     diclofenac sodium 1 % Gel  Commonly known as:  VOLTAREN  Apply 2 g topically 4 (four) times daily.     hydrochlorothiazide 12.5 MG tablet  Commonly known as:  HYDRODIURIL  Take 2  tablets (25 mg total) by mouth daily.     metoprolol succinate 25 MG 24 hr tablet  Commonly known as:  TOPROL-XL  Take 25 mg by mouth daily.     multivitamin with minerals tablet  Take 1 tablet by mouth daily.     NEORAL 25 MG capsule  Generic drug:  cycloSPORINE modified  Take 25 mg by mouth 2 (two) times daily. 3 tabs bid     ondansetron 4 MG tablet  Commonly known as:  ZOFRAN  Take 4 mg by mouth every 4 (four) hours as needed for nausea or vomiting.     oxyCODONE 15 MG immediate release tablet  Commonly known as:  ROXICODONE  Take 15 mg by mouth every 6 (six) hours as needed.     promethazine 12.5 MG tablet  Commonly known as:  PHENERGAN  Take 1 tablet (12.5 mg total) by mouth every 6 (six) hours as needed.     Vitamin C 250 MG Chew  Chew by mouth every morning.     Vitamin D (Ergocalciferol) 50000 UNITS Caps capsule  Commonly known as:  DRISDOL  Take 50,000 Units by mouth every 7 (seven) days.     zolpidem 5 MG tablet  Commonly known as:  AMBIEN  Take 1 tablet (5 mg total) by mouth at bedtime as needed  for sleep.       Allergies: No Known Allergies  Past Medical History, Surgical history, Social history, and Family History were reviewed and updated.  Review of Systems: All other 10 point review of systems is negative.   Physical Exam:  height is 5' (1.524 m) and weight is 159 lb (72.122 kg). Her oral temperature is 98.3 F (36.8 C). Her blood pressure is 133/73 and her pulse is 72. Her respiration is 14.   Wt Readings from Last 3 Encounters:  03/02/15 159 lb (72.122 kg)  12/26/14 150 lb (68.04 kg)  10/03/14 151 lb (68.493 kg)    Ocular: Sclerae unicteric, pupils equal, round and reactive to light Ear-nose-throat: Oropharynx clear, dentition fair Lymphatic: No cervical or supraclavicular adenopathy Lungs no rales or rhonchi, good excursion bilaterally Heart regular rate and rhythm, no murmur appreciated Abd soft, nontender, positive bowel sounds MSK no  focal spinal tenderness, no joint edema Neuro: non-focal, well-oriented, appropriate affect Breasts: Deferred  Lab Results  Component Value Date   WBC 3.1* 12/26/2014   HGB 8.7* 12/26/2014   HCT 26.8* 12/26/2014   MCV 94 12/26/2014   PLT 142* 12/26/2014   Lab Results  Component Value Date   FERRITIN 1,315* 12/26/2014   IRON 65 12/26/2014   TIBC 165* 12/26/2014   UIBC 100* 12/26/2014   IRONPCTSAT 39 12/26/2014   Lab Results  Component Value Date   RETICCTPCT 1.6 12/26/2014   RBC 2.84* 12/26/2014   RBC 2.96* 12/26/2014   RETICCTABS 47.4 12/26/2014   No results found for: KPAFRELGTCHN, LAMBDASER, KAPLAMBRATIO No results found for: Kandis Cocking [facility] Lab Results  Component Value Date   TOTALPROTELP 9.4* 11/19/2008   ALBUMINELP 41.1* 11/19/2008   A1GS 4.0 11/19/2008   A2GS 7.1 11/19/2008   BETS 4.0* 11/19/2008   BETA2SER 7.1* 11/19/2008   GAMS 36.7* 11/19/2008   MSPIKE NOT DET 11/19/2008   SPEI * 11/19/2008     Chemistry      Component Value Date/Time   NA 140 12/26/2014 1445   NA 136 10/03/2014 1512   K 4.9* 12/26/2014 1445   K 4.7 10/03/2014 1512   CL 102 12/26/2014 1445   CL 105 10/03/2014 1512   CO2 22 12/26/2014 1445   CO2 20 10/03/2014 1512   BUN 48* 12/26/2014 1445   BUN 35* 10/03/2014 1512   CREATININE 2.3* 12/26/2014 1445   CREATININE 2.08* 10/03/2014 1512      Component Value Date/Time   CALCIUM 8.6 12/26/2014 1445   CALCIUM 8.6 10/03/2014 1512   ALKPHOS 76 12/26/2014 1445   ALKPHOS 73 10/03/2014 1512   AST 29 12/26/2014 1445   AST 20 10/03/2014 1512   ALT 18 12/26/2014 1445   ALT 12 10/03/2014 1512   BILITOT 0.60 12/26/2014 1445   BILITOT 0.6 10/03/2014 1512     Impression and Plan: Ms. Bachrach is 59 year old African female with a history of liver transplant secondary to autoimmune hepatitis. She has done very well. There has been no evidence of rejection. She has a cough and while taking Theraflu had an increase in her BUN and  creatinine. These appear to be improving.  She will try Robitussin starting today. She has had no fevers or SOB.  She will continue drinking plenty of fluids.  Her Hgb today is 8.5 so we will give her Aranesp. We will see what her iron studies show.  We will see her back in 6 weeks for labs and follow-up. She knows to call here with any  questions or concerns. We can certainly see her back sooner if need be.   Eliezer Bottom, NP 5/9/20162:15 PM

## 2015-03-02 NOTE — Patient Instructions (Signed)
Darbepoetin Alfa injection What is this medicine? DARBEPOETIN ALFA (dar be POE e tin AL fa) helps your body make more red blood cells. It is used to treat anemia caused by chronic kidney failure and chemotherapy. This medicine may be used for other purposes; ask your health care provider or pharmacist if you have questions. COMMON BRAND NAME(S): Aranesp What should I tell my health care provider before I take this medicine? They need to know if you have any of these conditions: -blood clotting disorders or history of blood clots -cancer patient not on chemotherapy -cystic fibrosis -heart disease, such as angina, heart failure, or a history of a heart attack -hemoglobin level of 12 g/dL or greater -high blood pressure -low levels of folate, iron, or vitamin B12 -seizures -an unusual or allergic reaction to darbepoetin, erythropoietin, albumin, hamster proteins, latex, other medicines, foods, dyes, or preservatives -pregnant or trying to get pregnant -breast-feeding How should I use this medicine? This medicine is for injection into a vein or under the skin. It is usually given by a health care professional in a hospital or clinic setting. If you get this medicine at home, you will be taught how to prepare and give this medicine. Do not shake the solution before you withdraw a dose. Use exactly as directed. Take your medicine at regular intervals. Do not take your medicine more often than directed. It is important that you put your used needles and syringes in a special sharps container. Do not put them in a trash can. If you do not have a sharps container, call your pharmacist or healthcare provider to get one. Talk to your pediatrician regarding the use of this medicine in children. While this medicine may be used in children as young as 1 year for selected conditions, precautions do apply. Overdosage: If you think you have taken too much of this medicine contact a poison control center or  emergency room at once. NOTE: This medicine is only for you. Do not share this medicine with others. What if I miss a dose? If you miss a dose, take it as soon as you can. If it is almost time for your next dose, take only that dose. Do not take double or extra doses. What may interact with this medicine? Do not take this medicine with any of the following medications: -epoetin alfa This list may not describe all possible interactions. Give your health care provider a list of all the medicines, herbs, non-prescription drugs, or dietary supplements you use. Also tell them if you smoke, drink alcohol, or use illegal drugs. Some items may interact with your medicine. What should I watch for while using this medicine? Visit your prescriber or health care professional for regular checks on your progress and for the needed blood tests and blood pressure measurements. It is especially important for the doctor to make sure your hemoglobin level is in the desired range, to limit the risk of potential side effects and to give you the best benefit. Keep all appointments for any recommended tests. Check your blood pressure as directed. Ask your doctor what your blood pressure should be and when you should contact him or her. As your body makes more red blood cells, you may need to take iron, folic acid, or vitamin B supplements. Ask your doctor or health care provider which products are right for you. If you have kidney disease continue dietary restrictions, even though this medication can make you feel better. Talk with your doctor or health   care professional about the foods you eat and the vitamins that you take. What side effects may I notice from receiving this medicine? Side effects that you should report to your doctor or health care professional as soon as possible: -allergic reactions like skin rash, itching or hives, swelling of the face, lips, or tongue -breathing problems -changes in vision -chest  pain -confusion, trouble speaking or understanding -feeling faint or lightheaded, falls -high blood pressure -muscle aches or pains -pain, swelling, warmth in the leg -rapid weight gain -severe headaches -sudden numbness or weakness of the face, arm or leg -trouble walking, dizziness, loss of balance or coordination -seizures (convulsions) -swelling of the ankles, feet, hands -unusually weak or tired Side effects that usually do not require medical attention (report to your doctor or health care professional if they continue or are bothersome): -diarrhea -fever, chills (flu-like symptoms) -headaches -nausea, vomiting -redness, stinging, or swelling at site where injected This list may not describe all possible side effects. Call your doctor for medical advice about side effects. You may report side effects to FDA at 1-800-FDA-1088. Where should I keep my medicine? Keep out of the reach of children. Store in a refrigerator between 2 and 8 degrees C (36 and 46 degrees F). Do not freeze. Do not shake. Throw away any unused portion if using a single-dose vial. Throw away any unused medicine after the expiration date. NOTE: This sheet is a summary. It may not cover all possible information. If you have questions about this medicine, talk to your doctor, pharmacist, or health care provider.  2015, Elsevier/Gold Standard. (2008-09-23 10:23:57)  

## 2015-03-03 LAB — IRON AND TIBC CHCC
%SAT: 31 % (ref 21–57)
IRON: 54 ug/dL (ref 41–142)
TIBC: 174 ug/dL — ABNORMAL LOW (ref 236–444)
UIBC: 120 ug/dL (ref 120–384)

## 2015-03-03 LAB — FERRITIN CHCC: Ferritin: 751 ng/ml — ABNORMAL HIGH (ref 9–269)

## 2015-03-18 MED ORDER — ALLOPURINOL 100 MG TABLET
Freq: Every day | ORAL | 0 days | PRN
Start: 2015-03-18 — End: ?

## 2015-04-13 ENCOUNTER — Other Ambulatory Visit: Payer: Medicaid Other

## 2015-04-13 ENCOUNTER — Ambulatory Visit: Payer: Medicaid Other

## 2015-04-13 ENCOUNTER — Telehealth: Payer: Self-pay | Admitting: Hematology & Oncology

## 2015-04-13 ENCOUNTER — Ambulatory Visit: Payer: Medicaid Other | Admitting: Hematology & Oncology

## 2015-04-13 NOTE — Telephone Encounter (Signed)
Contacted pt regarding new appt per pt's request

## 2015-04-15 ENCOUNTER — Ambulatory Visit (HOSPITAL_BASED_OUTPATIENT_CLINIC_OR_DEPARTMENT_OTHER): Payer: Medicaid Other

## 2015-04-15 ENCOUNTER — Ambulatory Visit (HOSPITAL_BASED_OUTPATIENT_CLINIC_OR_DEPARTMENT_OTHER): Payer: Medicaid Other | Admitting: Hematology & Oncology

## 2015-04-15 ENCOUNTER — Other Ambulatory Visit (HOSPITAL_BASED_OUTPATIENT_CLINIC_OR_DEPARTMENT_OTHER): Payer: Medicaid Other

## 2015-04-15 ENCOUNTER — Encounter: Payer: Self-pay | Admitting: Hematology & Oncology

## 2015-04-15 VITALS — BP 135/70 | HR 80 | Temp 98.1°F | Resp 16 | Ht 60.0 in | Wt 153.0 lb

## 2015-04-15 DIAGNOSIS — N189 Chronic kidney disease, unspecified: Secondary | ICD-10-CM

## 2015-04-15 DIAGNOSIS — M5137 Other intervertebral disc degeneration, lumbosacral region: Secondary | ICD-10-CM

## 2015-04-15 DIAGNOSIS — D61818 Other pancytopenia: Secondary | ICD-10-CM

## 2015-04-15 DIAGNOSIS — D631 Anemia in chronic kidney disease: Secondary | ICD-10-CM

## 2015-04-15 DIAGNOSIS — Z944 Liver transplant status: Secondary | ICD-10-CM | POA: Diagnosis not present

## 2015-04-15 DIAGNOSIS — K754 Autoimmune hepatitis: Secondary | ICD-10-CM

## 2015-04-15 LAB — FERRITIN CHCC: FERRITIN: 702 ng/mL — AB (ref 9–269)

## 2015-04-15 LAB — CMP (CANCER CENTER ONLY)
ALK PHOS: 111 U/L — AB (ref 26–84)
ALT(SGPT): 16 U/L (ref 10–47)
AST: 23 U/L (ref 11–38)
Albumin: 3.3 g/dL (ref 3.3–5.5)
BILIRUBIN TOTAL: 0.7 mg/dL (ref 0.20–1.60)
BUN: 44 mg/dL — AB (ref 7–22)
CO2: 22 mEq/L (ref 18–33)
Calcium: 8.7 mg/dL (ref 8.0–10.3)
Chloride: 103 mEq/L (ref 98–108)
Creat: 2.7 mg/dl — ABNORMAL HIGH (ref 0.6–1.2)
GLUCOSE: 110 mg/dL (ref 73–118)
Potassium: 4.5 mEq/L (ref 3.3–4.7)
SODIUM: 135 meq/L (ref 128–145)
Total Protein: 8.5 g/dL — ABNORMAL HIGH (ref 6.4–8.1)

## 2015-04-15 LAB — CBC WITH DIFFERENTIAL (CANCER CENTER ONLY)
BASO#: 0 10*3/uL (ref 0.0–0.2)
BASO%: 0 % (ref 0.0–2.0)
EOS%: 2 % (ref 0.0–7.0)
Eosinophils Absolute: 0.1 10*3/uL (ref 0.0–0.5)
HEMATOCRIT: 26.8 % — AB (ref 34.8–46.6)
HGB: 8.9 g/dL — ABNORMAL LOW (ref 11.6–15.9)
LYMPH#: 0.6 10*3/uL — AB (ref 0.9–3.3)
LYMPH%: 19.7 % (ref 14.0–48.0)
MCH: 31.3 pg (ref 26.0–34.0)
MCHC: 33.2 g/dL (ref 32.0–36.0)
MCV: 94 fL (ref 81–101)
MONO#: 0.3 10*3/uL (ref 0.1–0.9)
MONO%: 9.5 % (ref 0.0–13.0)
NEUT%: 68.8 % (ref 39.6–80.0)
NEUTROS ABS: 2 10*3/uL (ref 1.5–6.5)
PLATELETS: 117 10*3/uL — AB (ref 145–400)
RBC: 2.84 10*6/uL — ABNORMAL LOW (ref 3.70–5.32)
RDW: 13.6 % (ref 11.1–15.7)
WBC: 2.9 10*3/uL — AB (ref 3.9–10.0)

## 2015-04-15 LAB — IRON AND TIBC CHCC
%SAT: 55 % (ref 21–57)
Iron: 94 ug/dL (ref 41–142)
TIBC: 170 ug/dL — ABNORMAL LOW (ref 236–444)
UIBC: 76 ug/dL — AB (ref 120–384)

## 2015-04-15 LAB — RETICULOCYTES (CHCC)
ABS RETIC: 29.1 10*3/uL (ref 19.0–186.0)
RBC.: 2.91 MIL/uL — ABNORMAL LOW (ref 3.87–5.11)
RETIC CT PCT: 1 % (ref 0.4–2.3)

## 2015-04-15 MED ORDER — DARBEPOETIN ALFA 300 MCG/0.6ML IJ SOSY
PREFILLED_SYRINGE | INTRAMUSCULAR | Status: AC
  Filled 2015-04-15: qty 0.6

## 2015-04-15 MED ORDER — DARBEPOETIN ALFA 300 MCG/0.6ML IJ SOSY
300.0000 ug | PREFILLED_SYRINGE | Freq: Once | INTRAMUSCULAR | Status: AC
  Administered 2015-04-15: 300 ug via SUBCUTANEOUS

## 2015-04-15 MED ORDER — GABAPENTIN 300 MG PO CAPS: 300.0000 mg | ORAL_CAPSULE | Freq: Two times a day (BID) | ORAL | Status: DC

## 2015-04-15 NOTE — Progress Notes (Signed)
Hematology and Oncology Follow Up Visit  Tami [name] [id number] 08-Jun-1956 59 y.o. 04/15/2015   Principle Diagnosis:  1. Chronic pancytopenia. 2. Status post liver transplant for autoimmune hepatitis. 3. Renal insufficiency with anemia.  Current Therapy:   Aranesp 300 mcg subcu as needed for hemoglobin less than 10.     Interim History:  [name] is back for followup. She is doing okay. Unfortunately, she lost her primary doctor. She wants to have a new primary care doctor. I'm assured how we can find her 1. The problem is that she has Medicaid. I am not sure if or which doctors will take Medicaid.  She sees the transplant doctors down at [facility] next month. Maybe they can assist in finding her a family doctor.  She's had some fatigue.  She will go to [place] next week. She is going to meet her daughter down there. Her daughter is coming back from [employer] in [place].  She is a little worried about her renal function. I think a lot of this is by from the cyclosporine. Again, the transplant doctors can help manage this.  Overall, her performance status is ECOG 1.   Medications:  Current outpatient prescriptions:  .  allopurinol (ZYLOPRIM) 100 MG tablet, Take 100 mg by mouth daily., Disp: , Rfl: 5 .  amLODipine (NORVASC) 10 MG tablet, Take 10 mg by mouth daily.  , Disp: , Rfl:  .  amLODipine (NORVASC) 5 MG tablet, Take 5 mg by mouth daily., Disp: , Rfl: 5 .  Ascorbic Acid (VITAMIN C) 250 MG CHEW, Chew by mouth every morning., Disp: , Rfl:  .  calcitonin, salmon, (MIACALCIN/FORTICAL) 200 UNIT/ACT nasal spray, Place 1 spray into alternate nostrils daily., Disp: , Rfl:  .  Calcium Carbonate-Vit D-Min (CALCIUM 1200 PO), Take by mouth 2 (two) times daily., Disp: , Rfl:  .  cholecalciferol (VITAMIN D) 1000 UNITS tablet, Take 1,000 Units by mouth daily., Disp: , Rfl:  .  cycloSPORINE modified (NEORAL) 25 MG capsule, Take 25 mg by mouth 2 (two) times daily. 3 tabs bid, Disp:  , Rfl:  .  gabapentin (NEURONTIN) 300 MG capsule, Take 1 capsule (300 mg total) by mouth 2 (two) times daily., Disp: 60 capsule, Rfl: 5 .  hydrochlorothiazide (HYDRODIURIL) 12.5 MG tablet, Take 2 tablets (25 mg total) by mouth daily., Disp: 60 tablet, Rfl: 3 .  metoprolol succinate (TOPROL-XL) 25 MG 24 hr tablet, Take 25 mg by mouth daily., Disp: , Rfl:  .  metoprolol succinate (TOPROL-XL) 50 MG 24 hr tablet, Take 50 mg by mouth daily., Disp: , Rfl: 5 .  Multiple Vitamins-Minerals (MULTIVITAMIN WITH MINERALS) tablet, Take 1 tablet by mouth daily.  , Disp: , Rfl:  .  oxyCODONE (ROXICODONE) 15 MG immediate release tablet, Take 15 mg by mouth every 6 (six) hours as needed., Disp: , Rfl:  .  promethazine (PHENERGAN) 12.5 MG tablet, Take 1 tablet (12.5 mg total) by mouth every 6 (six) hours as needed. (Patient not taking: Reported on 03/02/2015), Disp: 90 tablet, Rfl: 6 .  zolpidem (AMBIEN) 5 MG tablet, Take 1 tablet (5 mg total) by mouth at bedtime as needed for sleep., Disp: 30 tablet, Rfl: 0  Allergies: No Known Allergies  Past Medical History, Surgical history, Social history, and Family History were reviewed and updated.  Review of Systems: As above  Physical Exam:  height is 5' (1.524 m) and weight is 153 lb (69.4 kg). Her oral temperature is 98.1 F (36.7 C). Her  blood pressure is 135/70 and her pulse is 80. Her respiration is 16.   Petit African American female. Her head and neck exam shows moderate erythema in the pharynx. I do not see any exudate. Neck is with some slight adenopathy in the upper cervical nodes. Lungs are clear. Cardiac exam regular rate and rhythm. There are no murmurs, rubs or bruits. Abdomen is soft. Has good bowel sounds. She does have a well-healed laparotomy scar. She has an abdominal wall hernia. There is no fluid wave. No palpable liver or spleen tip. Extremities shows no clubbing cyanosis or edema. Skin exam shows no rashes. Neurological exam no focal neurological  deficits.  Lab Results  Component Value Date   WBC 2.9* 04/15/2015   HGB 8.9* 04/15/2015   HCT 26.8* 04/15/2015   MCV 94 04/15/2015   PLT 117* 04/15/2015     Chemistry      Component Value Date/Time   NA 135 04/15/2015 0810   NA 136 10/03/2014 1512   K 4.5 04/15/2015 0810   K 4.7 10/03/2014 1512   CL 103 04/15/2015 0810   CL 105 10/03/2014 1512   CO2 22 04/15/2015 0810   CO2 20 10/03/2014 1512   BUN 44* 04/15/2015 0810   BUN 35* 10/03/2014 1512   CREATININE 2.7* 04/15/2015 0810   CREATININE 2.08* 10/03/2014 1512      Component Value Date/Time   CALCIUM 8.7 04/15/2015 0810   CALCIUM 8.6 10/03/2014 1512   ALKPHOS 111* 04/15/2015 0810   ALKPHOS 73 10/03/2014 1512   AST 23 04/15/2015 0810   AST 20 10/03/2014 1512   ALT 16 04/15/2015 0810   ALT 12 10/03/2014 1512   BILITOT 0.70 04/15/2015 0810   BILITOT 0.6 10/03/2014 1512         Impression and Plan: Tami [name] is 59 year old African female. She has a history of liver transplant secondary to autoimmune hepatitis. She's done very well with this. Hopefully liver rejection will not be a problem.  For now, we will go ahead and give her a dose of Aranesp. This usually works quite well for her. She is chronic renal insufficiency. This I think will always be a problem for her as long she is on cyclosporine.  Her iron studies are okay.  She sees the transplant doctors at [facility] next month.  I will have her come back to see [name] in about 5-6 weeks.Tami Napoleon, MD 6/22/20162:21 PM

## 2015-04-15 NOTE — Patient Instructions (Signed)
Darbepoetin Alfa injection What is this medicine? DARBEPOETIN ALFA (dar be POE e tin AL fa) helps your body make more red blood cells. It is used to treat anemia caused by chronic kidney failure and chemotherapy. This medicine may be used for other purposes; ask your health care provider or pharmacist if you have questions. COMMON BRAND NAME(S): Aranesp What should I tell my health care provider before I take this medicine? They need to know if you have any of these conditions: -blood clotting disorders or history of blood clots -cancer patient not on chemotherapy -cystic fibrosis -heart disease, such as angina, heart failure, or a history of a heart attack -hemoglobin level of 12 g/dL or greater -high blood pressure -low levels of folate, iron, or vitamin B12 -seizures -an unusual or allergic reaction to darbepoetin, erythropoietin, albumin, hamster proteins, latex, other medicines, foods, dyes, or preservatives -pregnant or trying to get pregnant -breast-feeding How should I use this medicine? This medicine is for injection into a vein or under the skin. It is usually given by a health care professional in a hospital or clinic setting. If you get this medicine at home, you will be taught how to prepare and give this medicine. Do not shake the solution before you withdraw a dose. Use exactly as directed. Take your medicine at regular intervals. Do not take your medicine more often than directed. It is important that you put your used needles and syringes in a special sharps container. Do not put them in a trash can. If you do not have a sharps container, call your pharmacist or healthcare provider to get one. Talk to your pediatrician regarding the use of this medicine in children. While this medicine may be used in children as young as 1 year for selected conditions, precautions do apply. Overdosage: If you think you have taken too much of this medicine contact a poison control center or  emergency room at once. NOTE: This medicine is only for you. Do not share this medicine with others. What if I miss a dose? If you miss a dose, take it as soon as you can. If it is almost time for your next dose, take only that dose. Do not take double or extra doses. What may interact with this medicine? Do not take this medicine with any of the following medications: -epoetin alfa This list may not describe all possible interactions. Give your health care provider a list of all the medicines, herbs, non-prescription drugs, or dietary supplements you use. Also tell them if you smoke, drink alcohol, or use illegal drugs. Some items may interact with your medicine. What should I watch for while using this medicine? Visit your prescriber or health care professional for regular checks on your progress and for the needed blood tests and blood pressure measurements. It is especially important for the doctor to make sure your hemoglobin level is in the desired range, to limit the risk of potential side effects and to give you the best benefit. Keep all appointments for any recommended tests. Check your blood pressure as directed. Ask your doctor what your blood pressure should be and when you should contact him or her. As your body makes more red blood cells, you may need to take iron, folic acid, or vitamin B supplements. Ask your doctor or health care provider which products are right for you. If you have kidney disease continue dietary restrictions, even though this medication can make you feel better. Talk with your doctor or health   care professional about the foods you eat and the vitamins that you take. What side effects may I notice from receiving this medicine? Side effects that you should report to your doctor or health care professional as soon as possible: -allergic reactions like skin rash, itching or hives, swelling of the face, lips, or tongue -breathing problems -changes in vision -chest  pain -confusion, trouble speaking or understanding -feeling faint or lightheaded, falls -high blood pressure -muscle aches or pains -pain, swelling, warmth in the leg -rapid weight gain -severe headaches -sudden numbness or weakness of the face, arm or leg -trouble walking, dizziness, loss of balance or coordination -seizures (convulsions) -swelling of the ankles, feet, hands -unusually weak or tired Side effects that usually do not require medical attention (report to your doctor or health care professional if they continue or are bothersome): -diarrhea -fever, chills (flu-like symptoms) -headaches -nausea, vomiting -redness, stinging, or swelling at site where injected This list may not describe all possible side effects. Call your doctor for medical advice about side effects. You may report side effects to FDA at 1-800-FDA-1088. Where should I keep my medicine? Keep out of the reach of children. Store in a refrigerator between 2 and 8 degrees C (36 and 46 degrees F). Do not freeze. Do not shake. Throw away any unused portion if using a single-dose vial. Throw away any unused medicine after the expiration date. NOTE: This sheet is a summary. It may not cover all possible information. If you have questions about this medicine, talk to your doctor, pharmacist, or health care provider.  2015, Elsevier/Gold Standard. (2008-09-23 10:23:57)  

## 2015-05-27 ENCOUNTER — Ambulatory Visit (HOSPITAL_BASED_OUTPATIENT_CLINIC_OR_DEPARTMENT_OTHER): Payer: Medicaid Other

## 2015-05-27 ENCOUNTER — Encounter: Payer: Self-pay | Admitting: Family

## 2015-05-27 ENCOUNTER — Other Ambulatory Visit (HOSPITAL_BASED_OUTPATIENT_CLINIC_OR_DEPARTMENT_OTHER): Payer: Medicaid Other

## 2015-05-27 ENCOUNTER — Other Ambulatory Visit: Payer: Self-pay | Admitting: *Deleted

## 2015-05-27 ENCOUNTER — Ambulatory Visit (HOSPITAL_BASED_OUTPATIENT_CLINIC_OR_DEPARTMENT_OTHER): Payer: Medicaid Other | Admitting: Family

## 2015-05-27 VITALS — BP 137/80 | HR 68 | Temp 98.2°F | Resp 16 | Ht 60.0 in | Wt 155.0 lb

## 2015-05-27 DIAGNOSIS — N189 Chronic kidney disease, unspecified: Secondary | ICD-10-CM

## 2015-05-27 DIAGNOSIS — D649 Anemia, unspecified: Secondary | ICD-10-CM

## 2015-05-27 DIAGNOSIS — N289 Disorder of kidney and ureter, unspecified: Secondary | ICD-10-CM

## 2015-05-27 DIAGNOSIS — J329 Chronic sinusitis, unspecified: Secondary | ICD-10-CM

## 2015-05-27 DIAGNOSIS — Z944 Liver transplant status: Secondary | ICD-10-CM | POA: Diagnosis not present

## 2015-05-27 DIAGNOSIS — D631 Anemia in chronic kidney disease: Secondary | ICD-10-CM

## 2015-05-27 DIAGNOSIS — D61818 Other pancytopenia: Secondary | ICD-10-CM

## 2015-05-27 DIAGNOSIS — K754 Autoimmune hepatitis: Secondary | ICD-10-CM | POA: Diagnosis not present

## 2015-05-27 LAB — CBC WITH DIFFERENTIAL (CANCER CENTER ONLY)
BASO#: 0 10*3/uL (ref 0.0–0.2)
BASO%: 0 % (ref 0.0–2.0)
EOS%: 2.5 % (ref 0.0–7.0)
Eosinophils Absolute: 0.1 10*3/uL (ref 0.0–0.5)
HEMATOCRIT: 27.7 % — AB (ref 34.8–46.6)
HGB: 9 g/dL — ABNORMAL LOW (ref 11.6–15.9)
LYMPH#: 0.6 10*3/uL — ABNORMAL LOW (ref 0.9–3.3)
LYMPH%: 19 % (ref 14.0–48.0)
MCH: 30.7 pg (ref 26.0–34.0)
MCHC: 32.5 g/dL (ref 32.0–36.0)
MCV: 95 fL (ref 81–101)
MONO#: 0.4 10*3/uL (ref 0.1–0.9)
MONO%: 13.4 % — ABNORMAL HIGH (ref 0.0–13.0)
NEUT#: 2.1 10*3/uL (ref 1.5–6.5)
NEUT%: 65.1 % (ref 39.6–80.0)
PLATELETS: 94 10*3/uL — AB (ref 145–400)
RBC: 2.93 10*6/uL — AB (ref 3.70–5.32)
RDW: 13.1 % (ref 11.1–15.7)
WBC: 3.2 10*3/uL — AB (ref 3.9–10.0)

## 2015-05-27 MED ORDER — DARBEPOETIN ALFA 300 MCG/0.6ML IJ SOSY
300.0000 ug | PREFILLED_SYRINGE | Freq: Once | INTRAMUSCULAR | Status: AC
  Administered 2015-05-27: 300 ug via SUBCUTANEOUS

## 2015-05-27 MED ORDER — DARBEPOETIN ALFA 300 MCG/0.6ML IJ SOSY
PREFILLED_SYRINGE | INTRAMUSCULAR | Status: AC
  Filled 2015-05-27: qty 0.6

## 2015-05-27 MED ORDER — ZOLPIDEM TARTRATE 5 MG PO TABS: 5.0000 mg | ORAL_TABLET | Freq: Every evening | ORAL | Status: DC | PRN

## 2015-05-27 MED ORDER — AZITHROMYCIN 250 MG PO TABS: ORAL_TABLET | ORAL | Status: DC

## 2015-05-27 NOTE — Progress Notes (Signed)
Hematology and Oncology Follow Up Visit  Tami [name] [id number] 1956-02-09 59 y.o. 05/27/2015  Principle Diagnosis:  Chronic pancytopenia.  Status post liver transplant for autoimmune hepatitis.  Renal insufficiency with anemia.  Current Therapy:   Aranesp 300 mcg subcu as needed for hemoglobin less than 10.    Interim History: Tami [name] is here today for a follow-up. She is doing much better today. She has a sinus infection and sore throat. She has had some phlegm. She was around a friend that was sick with the same infection. She states that her friends infection resolved after finishing a z-pack.  She had a wonderful time with her daughter in [place] a few weeks ago. They really enjoyed spending time with each other before her daughter went back to [place].    She denies fever, chills, n/v, rash, headache, dizziness, SOB, chest pain, palpitations, abdominal pain, changes in bowel or bladder habits.  Her platelets today are down to 94. She has had no episodes of bleeding or bruising. We will continue to monitor this.  She has lab work done monthly at [facility] him. She states that her labs last week were "good." No swelling, tenderness, numbness or tingling in her extremities. No new aches or pains.  Her appetite is good and she is staying hydrated. She is concerned about weight gain and plans to start eating healthier and walking on her treadmill.   Medications:    Medication List       This list is accurate as of: 05/27/15 11:00 AM.  Always use your most recent med list.               allopurinol 100 MG tablet  Commonly known as:  ZYLOPRIM  Take 100 mg by mouth daily.     amLODipine 10 MG tablet  Commonly known as:  NORVASC  Take 10 mg by mouth daily.     amLODipine 5 MG tablet  Commonly known as:  NORVASC  Take 5 mg by mouth daily.     calcitonin (salmon) 200 UNIT/ACT nasal spray  Commonly known as:  MIACALCIN/FORTICAL  Place 1 spray into alternate nostrils daily.       CALCIUM 1200 PO  Take by mouth 2 (two) times daily.     cholecalciferol 1000 UNITS tablet  Commonly known as:  VITAMIN D  Take 1,000 Units by mouth daily.     gabapentin 300 MG capsule  Commonly known as:  NEURONTIN  Take 1 capsule (300 mg total) by mouth 2 (two) times daily.     hydrochlorothiazide 12.5 MG tablet  Commonly known as:  HYDRODIURIL  Take 2 tablets (25 mg total) by mouth daily.     metoprolol succinate 50 MG 24 hr tablet  Commonly known as:  TOPROL-XL  Take 50 mg by mouth daily.     metoprolol succinate 25 MG 24 hr tablet  Commonly known as:  TOPROL-XL  Take 25 mg by mouth daily.     multivitamin with minerals tablet  Take 1 tablet by mouth daily.     NEORAL 25 MG capsule  Generic drug:  cycloSPORINE modified  Take 25 mg by mouth 2 (two) times daily. 3 tabs bid     oxyCODONE 15 MG immediate release tablet  Commonly known as:  ROXICODONE  Take 15 mg by mouth every 6 (six) hours as needed.     promethazine 12.5 MG tablet  Commonly known as:  PHENERGAN  Take 1 tablet (12.5 mg total) by mouth every  6 (six) hours as needed.     Vitamin C 250 MG Chew  Chew by mouth every morning.     zolpidem 5 MG tablet  Commonly known as:  AMBIEN  Take 1 tablet (5 mg total) by mouth at bedtime as needed for sleep.       Allergies: No Known Allergies  Past Medical History, Surgical history, Social history, and Family History were reviewed and updated.  Review of Systems: All other 10 point review of systems is negative.   Physical Exam:  height is 5' (1.524 m) and weight is 155 lb (70.308 kg). Her oral temperature is 98.2 F (36.8 C). Her blood pressure is 137/80 and her pulse is 68. Her respiration is 16.   Wt Readings from Last 3 Encounters:  05/27/15 155 lb (70.308 kg)  04/15/15 153 lb (69.4 kg)  03/02/15 159 lb (72.122 kg)    Ocular: Sclerae unicteric, pupils equal, round and reactive to light Ear-nose-throat: Oropharynx clear, dentition  fair Lymphatic: No cervical or supraclavicular adenopathy Lungs no rales or rhonchi, good excursion bilaterally Heart regular rate and rhythm, no murmur appreciated Abd soft, nontender, positive bowel sounds MSK no focal spinal tenderness, no joint edema Neuro: non-focal, well-oriented, appropriate affect Breasts: Deferred  Lab Results  Component Value Date   WBC 3.2* 05/27/2015   HGB 9.0* 05/27/2015   HCT 27.7* 05/27/2015   MCV 95 05/27/2015   PLT 94* 05/27/2015   Lab Results  Component Value Date   FERRITIN 702* 04/15/2015   IRON 94 04/15/2015   TIBC 170* 04/15/2015   UIBC 76* 04/15/2015   IRONPCTSAT 55 04/15/2015   Lab Results  Component Value Date   RETICCTPCT 1.0 04/15/2015   RBC 2.93* 05/27/2015   RETICCTABS 29.1 04/15/2015   No results found for: KPAFRELGTCHN, LAMBDASER, KAPLAMBRATIO No results found for: Kandis Cocking [facility] Lab Results  Component Value Date   TOTALPROTELP 9.4* 11/19/2008   ALBUMINELP 41.1* 11/19/2008   A1GS 4.0 11/19/2008   A2GS 7.1 11/19/2008   BETS 4.0* 11/19/2008   BETA2SER 7.1* 11/19/2008   GAMS 36.7* 11/19/2008   MSPIKE NOT DET 11/19/2008   SPEI * 11/19/2008     Chemistry      Component Value Date/Time   NA 135 04/15/2015 0810   NA 136 10/03/2014 1512   K 4.5 04/15/2015 0810   K 4.7 10/03/2014 1512   CL 103 04/15/2015 0810   CL 105 10/03/2014 1512   CO2 22 04/15/2015 0810   CO2 20 10/03/2014 1512   BUN 44* 04/15/2015 0810   BUN 35* 10/03/2014 1512   CREATININE 2.7* 04/15/2015 0810   CREATININE 2.08* 10/03/2014 1512      Component Value Date/Time   CALCIUM 8.7 04/15/2015 0810   CALCIUM 8.6 10/03/2014 1512   ALKPHOS 111* 04/15/2015 0810   ALKPHOS 73 10/03/2014 1512   AST 23 04/15/2015 0810   AST 20 10/03/2014 1512   ALT 16 04/15/2015 0810   ALT 12 10/03/2014 1512   BILITOT 0.70 04/15/2015 0810   BILITOT 0.6 10/03/2014 1512     Impression and Plan: Tami [name] is 59 year old African female with a history of liver  transplant secondary to autoimmune hepatitis. So far, there has been no evidence of rejection.  She has a sinus infection today and would like a Z-pack. Prescription was sent to her pharmacy.  Her biggest concern right now is her weight gain. She plans to change her diet and start walking to lose weight.  Her Hgb today  is 9.0 so we will give her an Aranesp injection. Her creatinine was still slightly elevated at 1.62 with her labs at [facility] last week. Her platelet count continue to fluctuate and is 94 today. We will watch this. She has had no episodes of bleeding.  We will see her back in 2 months for labs and follow-up. She knows to call here with any questions or concerns. We can certainly see her back sooner if need be.   Eliezer Bottom, NP 8/3/201611:00 AM

## 2015-05-27 NOTE — Patient Instructions (Signed)
Darbepoetin Alfa injection What is this medicine? DARBEPOETIN ALFA (dar be POE e tin AL fa) helps your body make more red blood cells. It is used to treat anemia caused by chronic kidney failure and chemotherapy. This medicine may be used for other purposes; ask your health care provider or pharmacist if you have questions. COMMON BRAND NAME(S): Aranesp What should I tell my health care provider before I take this medicine? They need to know if you have any of these conditions: -blood clotting disorders or history of blood clots -cancer patient not on chemotherapy -cystic fibrosis -heart disease, such as angina, heart failure, or a history of a heart attack -hemoglobin level of 12 g/dL or greater -high blood pressure -low levels of folate, iron, or vitamin B12 -seizures -an unusual or allergic reaction to darbepoetin, erythropoietin, albumin, hamster proteins, latex, other medicines, foods, dyes, or preservatives -pregnant or trying to get pregnant -breast-feeding How should I use this medicine? This medicine is for injection into a vein or under the skin. It is usually given by a health care professional in a hospital or clinic setting. If you get this medicine at home, you will be taught how to prepare and give this medicine. Do not shake the solution before you withdraw a dose. Use exactly as directed. Take your medicine at regular intervals. Do not take your medicine more often than directed. It is important that you put your used needles and syringes in a special sharps container. Do not put them in a trash can. If you do not have a sharps container, call your pharmacist or healthcare provider to get one. Talk to your pediatrician regarding the use of this medicine in children. While this medicine may be used in children as young as 1 year for selected conditions, precautions do apply. Overdosage: If you think you have taken too much of this medicine contact a poison control center or  emergency room at once. NOTE: This medicine is only for you. Do not share this medicine with others. What if I miss a dose? If you miss a dose, take it as soon as you can. If it is almost time for your next dose, take only that dose. Do not take double or extra doses. What may interact with this medicine? Do not take this medicine with any of the following medications: -epoetin alfa This list may not describe all possible interactions. Give your health care provider a list of all the medicines, herbs, non-prescription drugs, or dietary supplements you use. Also tell them if you smoke, drink alcohol, or use illegal drugs. Some items may interact with your medicine. What should I watch for while using this medicine? Visit your prescriber or health care professional for regular checks on your progress and for the needed blood tests and blood pressure measurements. It is especially important for the doctor to make sure your hemoglobin level is in the desired range, to limit the risk of potential side effects and to give you the best benefit. Keep all appointments for any recommended tests. Check your blood pressure as directed. Ask your doctor what your blood pressure should be and when you should contact him or her. As your body makes more red blood cells, you may need to take iron, folic acid, or vitamin B supplements. Ask your doctor or health care provider which products are right for you. If you have kidney disease continue dietary restrictions, even though this medication can make you feel better. Talk with your doctor or health   care professional about the foods you eat and the vitamins that you take. What side effects may I notice from receiving this medicine? Side effects that you should report to your doctor or health care professional as soon as possible: -allergic reactions like skin rash, itching or hives, swelling of the face, lips, or tongue -breathing problems -changes in vision -chest  pain -confusion, trouble speaking or understanding -feeling faint or lightheaded, falls -high blood pressure -muscle aches or pains -pain, swelling, warmth in the leg -rapid weight gain -severe headaches -sudden numbness or weakness of the face, arm or leg -trouble walking, dizziness, loss of balance or coordination -seizures (convulsions) -swelling of the ankles, feet, hands -unusually weak or tired Side effects that usually do not require medical attention (report to your doctor or health care professional if they continue or are bothersome): -diarrhea -fever, chills (flu-like symptoms) -headaches -nausea, vomiting -redness, stinging, or swelling at site where injected This list may not describe all possible side effects. Call your doctor for medical advice about side effects. You may report side effects to FDA at 1-800-FDA-1088. Where should I keep my medicine? Keep out of the reach of children. Store in a refrigerator between 2 and 8 degrees C (36 and 46 degrees F). Do not freeze. Do not shake. Throw away any unused portion if using a single-dose vial. Throw away any unused medicine after the expiration date. NOTE: This sheet is a summary. It may not cover all possible information. If you have questions about this medicine, talk to your doctor, pharmacist, or health care provider.  2015, Elsevier/Gold Standard. (2008-09-23 10:23:57)  

## 2015-06-23 ENCOUNTER — Other Ambulatory Visit: Payer: Self-pay | Admitting: Nurse Practitioner

## 2015-06-23 DIAGNOSIS — K754 Autoimmune hepatitis: Secondary | ICD-10-CM

## 2015-06-23 DIAGNOSIS — G4701 Insomnia due to medical condition: Secondary | ICD-10-CM

## 2015-06-23 MED ORDER — PROMETHAZINE HCL 12.5 MG PO TABS: 12.5000 mg | ORAL_TABLET | Freq: Four times a day (QID) | ORAL | Status: DC | PRN

## 2015-07-29 ENCOUNTER — Encounter: Payer: Self-pay | Admitting: Family

## 2015-07-29 ENCOUNTER — Ambulatory Visit: Payer: Medicaid Other | Admitting: Hematology & Oncology

## 2015-07-29 ENCOUNTER — Ambulatory Visit: Payer: Medicaid Other

## 2015-07-29 ENCOUNTER — Ambulatory Visit (HOSPITAL_BASED_OUTPATIENT_CLINIC_OR_DEPARTMENT_OTHER): Payer: Medicaid Other

## 2015-07-29 ENCOUNTER — Ambulatory Visit (HOSPITAL_BASED_OUTPATIENT_CLINIC_OR_DEPARTMENT_OTHER): Payer: Medicaid Other | Admitting: Family

## 2015-07-29 ENCOUNTER — Other Ambulatory Visit: Payer: Medicaid Other

## 2015-07-29 ENCOUNTER — Other Ambulatory Visit (HOSPITAL_BASED_OUTPATIENT_CLINIC_OR_DEPARTMENT_OTHER): Payer: Medicaid Other

## 2015-07-29 VITALS — BP 129/71 | HR 78 | Temp 98.1°F | Resp 16 | Ht 60.0 in | Wt 152.0 lb

## 2015-07-29 DIAGNOSIS — N189 Chronic kidney disease, unspecified: Secondary | ICD-10-CM

## 2015-07-29 DIAGNOSIS — D631 Anemia in chronic kidney disease: Secondary | ICD-10-CM

## 2015-07-29 DIAGNOSIS — J329 Chronic sinusitis, unspecified: Secondary | ICD-10-CM

## 2015-07-29 DIAGNOSIS — D61818 Other pancytopenia: Secondary | ICD-10-CM

## 2015-07-29 DIAGNOSIS — K754 Autoimmune hepatitis: Secondary | ICD-10-CM

## 2015-07-29 LAB — COMPREHENSIVE METABOLIC PANEL (CC13)
ALT: 10 U/L (ref 0–55)
AST: 14 U/L (ref 5–34)
Albumin: 3.3 g/dL — ABNORMAL LOW (ref 3.5–5.0)
Alkaline Phosphatase: 110 U/L (ref 40–150)
Anion Gap: 7 mEq/L (ref 3–11)
BUN: 56.1 mg/dL — AB (ref 7.0–26.0)
CHLORIDE: 110 meq/L — AB (ref 98–109)
CO2: 20 meq/L — AB (ref 22–29)
Calcium: 8.3 mg/dL — ABNORMAL LOW (ref 8.4–10.4)
Creatinine: 3 mg/dL (ref 0.6–1.1)
EGFR: 19 mL/min/{1.73_m2} — ABNORMAL LOW (ref >90)
GLUCOSE: 114 mg/dL (ref 70–140)
POTASSIUM: 4.9 meq/L (ref 3.5–5.1)
SODIUM: 137 meq/L (ref 136–145)
Total Bilirubin: 0.52 mg/dL (ref 0.20–1.20)
Total Protein: 7.9 g/dL (ref 6.4–8.3)

## 2015-07-29 LAB — RETICULOCYTES (CHCC)
ABS RETIC: 29.7 10*3/uL (ref 19.0–186.0)
RBC.: 2.97 MIL/uL — AB (ref 3.87–5.11)
RETIC CT PCT: 1 % (ref 0.4–2.3)

## 2015-07-29 LAB — CBC WITH DIFFERENTIAL (CANCER CENTER ONLY)
BASO#: 0 10*3/uL (ref 0.0–0.2)
BASO%: 0 % (ref 0.0–2.0)
EOS%: 2 % (ref 0.0–7.0)
Eosinophils Absolute: 0.1 10*3/uL (ref 0.0–0.5)
HEMATOCRIT: 27.3 % — AB (ref 34.8–46.6)
HGB: 8.9 g/dL — ABNORMAL LOW (ref 11.6–15.9)
LYMPH#: 0.6 10*3/uL — ABNORMAL LOW (ref 0.9–3.3)
LYMPH%: 22.5 % (ref 14.0–48.0)
MCH: 30 pg (ref 26.0–34.0)
MCHC: 32.6 g/dL (ref 32.0–36.0)
MCV: 92 fL (ref 81–101)
MONO#: 0.3 10*3/uL (ref 0.1–0.9)
MONO%: 11.2 % (ref 0.0–13.0)
NEUT#: 1.6 10*3/uL (ref 1.5–6.5)
NEUT%: 64.3 % (ref 39.6–80.0)
PLATELETS: 102 10*3/uL — AB (ref 145–400)
RBC: 2.97 10*6/uL — ABNORMAL LOW (ref 3.70–5.32)
RDW: 13.7 % (ref 11.1–15.7)
WBC: 2.5 10*3/uL — ABNORMAL LOW (ref 3.9–10.0)

## 2015-07-29 LAB — FERRITIN CHCC: Ferritin: 554 ng/ml — ABNORMAL HIGH (ref 9–269)

## 2015-07-29 LAB — IRON AND TIBC CHCC
%SAT: 65 % — ABNORMAL HIGH (ref 21–57)
Iron: 104 ug/dL (ref 41–142)
TIBC: 159 ug/dL — AB (ref 236–444)
UIBC: 55 ug/dL — ABNORMAL LOW (ref 120–384)

## 2015-07-29 MED ORDER — DARBEPOETIN ALFA 300 MCG/0.6ML IJ SOSY
PREFILLED_SYRINGE | INTRAMUSCULAR | Status: AC
  Filled 2015-07-29: qty 0.6

## 2015-07-29 MED ORDER — AZITHROMYCIN 250 MG PO TABS: ORAL_TABLET | ORAL | Status: DC

## 2015-07-29 MED ORDER — DARBEPOETIN ALFA 300 MCG/0.6ML IJ SOSY
300.0000 ug | PREFILLED_SYRINGE | Freq: Once | INTRAMUSCULAR | Status: AC
  Administered 2015-07-29: 300 ug via SUBCUTANEOUS

## 2015-07-29 NOTE — Patient Instructions (Signed)
Darbepoetin Alfa injection What is this medicine? DARBEPOETIN ALFA (dar be POE e tin AL fa) helps your body make more red blood cells. It is used to treat anemia caused by chronic kidney failure and chemotherapy. This medicine may be used for other purposes; ask your health care provider or pharmacist if you have questions. What should I tell my health care provider before I take this medicine? They need to know if you have any of these conditions: -blood clotting disorders or history of blood clots -cancer patient not on chemotherapy -cystic fibrosis -heart disease, such as angina, heart failure, or a history of a heart attack -hemoglobin level of 12 g/dL or greater -high blood pressure -low levels of folate, iron, or vitamin B12 -seizures -an unusual or allergic reaction to darbepoetin, erythropoietin, albumin, hamster proteins, latex, other medicines, foods, dyes, or preservatives -pregnant or trying to get pregnant -breast-feeding How should I use this medicine? This medicine is for injection into a vein or under the skin. It is usually given by a health care professional in a hospital or clinic setting. If you get this medicine at home, you will be taught how to prepare and give this medicine. Do not shake the solution before you withdraw a dose. Use exactly as directed. Take your medicine at regular intervals. Do not take your medicine more often than directed. It is important that you put your used needles and syringes in a special sharps container. Do not put them in a trash can. If you do not have a sharps container, call your pharmacist or healthcare provider to get one. Talk to your pediatrician regarding the use of this medicine in children. While this medicine may be used in children as young as 1 year for selected conditions, precautions do apply. Overdosage: If you think you have taken too much of this medicine contact a poison control center or emergency room at once. NOTE:  This medicine is only for you. Do not share this medicine with others. What if I miss a dose? If you miss a dose, take it as soon as you can. If it is almost time for your next dose, take only that dose. Do not take double or extra doses. What may interact with this medicine? Do not take this medicine with any of the following medications: -epoetin alfa This list may not describe all possible interactions. Give your health care provider a list of all the medicines, herbs, non-prescription drugs, or dietary supplements you use. Also tell them if you smoke, drink alcohol, or use illegal drugs. Some items may interact with your medicine. What should I watch for while using this medicine? Visit your prescriber or health care professional for regular checks on your progress and for the needed blood tests and blood pressure measurements. It is especially important for the doctor to make sure your hemoglobin level is in the desired range, to limit the risk of potential side effects and to give you the best benefit. Keep all appointments for any recommended tests. Check your blood pressure as directed. Ask your doctor what your blood pressure should be and when you should contact him or her. As your body makes more red blood cells, you may need to take iron, folic acid, or vitamin B supplements. Ask your doctor or health care provider which products are right for you. If you have kidney disease continue dietary restrictions, even though this medication can make you feel better. Talk with your doctor or health care professional about the   foods you eat and the vitamins that you take. What side effects may I notice from receiving this medicine? Side effects that you should report to your doctor or health care professional as soon as possible: -allergic reactions like skin rash, itching or hives, swelling of the face, lips, or tongue -breathing problems -changes in vision -chest pain -confusion, trouble speaking  or understanding -feeling faint or lightheaded, falls -high blood pressure -muscle aches or pains -pain, swelling, warmth in the leg -rapid weight gain -severe headaches -sudden numbness or weakness of the face, arm or leg -trouble walking, dizziness, loss of balance or coordination -seizures (convulsions) -swelling of the ankles, feet, hands -unusually weak or tired Side effects that usually do not require medical attention (report to your doctor or health care professional if they continue or are bothersome): -diarrhea -fever, chills (flu-like symptoms) -headaches -nausea, vomiting -redness, stinging, or swelling at site where injected This list may not describe all possible side effects. Call your doctor for medical advice about side effects. You may report side effects to FDA at 1-800-FDA-1088. Where should I keep my medicine? Keep out of the reach of children. Store in a refrigerator between 2 and 8 degrees C (36 and 46 degrees F). Do not freeze. Do not shake. Throw away any unused portion if using a single-dose vial. Throw away any unused medicine after the expiration date. NOTE: This sheet is a summary. It may not cover all possible information. If you have questions about this medicine, talk to your doctor, pharmacist, or health care provider.    2016, Elsevier/Gold Standard. (2008-09-23 10:23:57)  

## 2015-07-29 NOTE — Progress Notes (Signed)
Hematology and Oncology Follow Up Visit  Tami [name] [id number] November 04, 1955 59 y.o. 07/29/2015  Principle Diagnosis:  Chronic pancytopenia.  Status post liver transplant for autoimmune hepatitis.  Renal insufficiency with anemia.  Current Therapy:   Aranesp 300 mcg subcu as needed for hemoglobin less than 10.    Interim History: Tami [name] is here today for a follow-up. She has a sinus infection with a dry cough and drainage giving her a sore throat. She has had no fever or chills. She states that she had this same issue 2 months ago and was treated with an antibiotic.  No n/v, rash, headache, dizziness, SOB, chest pain, palpitations, abdominal pain, changes in bowel or bladder habits.  She had lab work done at [facility] last week and states that they were able to tell she missed a dose of her medication. She was unable to remember this so she went out and purchased a "days of the week" pill box. This has helped her. Her LFT's look good.  She has had no swelling, tenderness, numbness or tingling in her extremities. No new aches or pains.  She is eating well and staying hydrated. Her weight is stable.   Medications:    Medication List       This list is accurate as of: 07/29/15 11:37 AM.  Always use your most recent med list.               allopurinol 100 MG tablet  Commonly known as:  ZYLOPRIM  Take 100 mg by mouth daily.     amLODipine 10 MG tablet  Commonly known as:  NORVASC  Take 10 mg by mouth daily.     azithromycin 250 MG tablet  Commonly known as:  ZITHROMAX Z-PAK  Take as directed on package.     calcitonin (salmon) 200 UNIT/ACT nasal spray  Commonly known as:  MIACALCIN/FORTICAL  Place 1 spray into alternate nostrils daily.     CALCIUM 1200 PO  Take by mouth 2 (two) times daily.     cholecalciferol 1000 UNITS tablet  Commonly known as:  VITAMIN D  Take 1,000 Units by mouth daily.     gabapentin 300 MG capsule  Commonly known as:  NEURONTIN  Take 1  capsule (300 mg total) by mouth 2 (two) times daily.     hydrochlorothiazide 12.5 MG tablet  Commonly known as:  HYDRODIURIL  Take 2 tablets (25 mg total) by mouth daily.     metoprolol succinate 50 MG 24 hr tablet  Commonly known as:  TOPROL-XL  Take 50 mg by mouth daily.     multivitamin with minerals tablet  Take 1 tablet by mouth daily.     NEORAL 25 MG capsule  Generic drug:  cycloSPORINE modified  Take 25 mg by mouth 2 (two) times daily. 3 tabs bid     oxyCODONE 15 MG immediate release tablet  Commonly known as:  ROXICODONE  Take 15 mg by mouth every 6 (six) hours as needed.     promethazine 12.5 MG tablet  Commonly known as:  PHENERGAN  Take 1 tablet (12.5 mg total) by mouth every 6 (six) hours as needed.     Vitamin C 250 MG Chew  Chew by mouth every morning.     zolpidem 5 MG tablet  Commonly known as:  AMBIEN  Take 1 tablet (5 mg total) by mouth at bedtime as needed for sleep.       Allergies: No Known Allergies  Past Medical  History, Surgical history, Social history, and Family History were reviewed and updated.  Review of Systems: All other 10 point review of systems is negative.   Physical Exam:  height is 5' (1.524 m) and weight is 152 lb (68.947 kg). Her oral temperature is 98.1 F (36.7 C). Her blood pressure is 129/71 and her pulse is 78. Her respiration is 16.   Wt Readings from Last 3 Encounters:  07/29/15 152 lb (68.947 kg)  05/27/15 155 lb (70.308 kg)  04/15/15 153 lb (69.4 kg)    Ocular: Sclerae unicteric, pupils equal, round and reactive to light Ear-nose-throat: Oropharynx clear, dentition fair Lymphatic: No cervical or supraclavicular adenopathy Lungs no rales or rhonchi, good excursion bilaterally Heart regular rate and rhythm, no murmur appreciated Abd soft, nontender, positive bowel sounds MSK no focal spinal tenderness, no joint edema Neuro: non-focal, well-oriented, appropriate affect Breasts: Deferred  Lab Results  Component  Value Date   WBC 2.5* 07/29/2015   HGB 8.9* 07/29/2015   HCT 27.3* 07/29/2015   MCV 92 07/29/2015   PLT 102* 07/29/2015   Lab Results  Component Value Date   FERRITIN 702* 04/15/2015   IRON 94 04/15/2015   TIBC 170* 04/15/2015   UIBC 76* 04/15/2015   IRONPCTSAT 55 04/15/2015   Lab Results  Component Value Date   RETICCTPCT 1.0 04/15/2015   RBC 2.97* 07/29/2015   RETICCTABS 29.1 04/15/2015   No results found for: KPAFRELGTCHN, LAMBDASER, KAPLAMBRATIO No results found for: Kandis Cocking [facility] Lab Results  Component Value Date   TOTALPROTELP 9.4* 11/19/2008   ALBUMINELP 41.1* 11/19/2008   A1GS 4.0 11/19/2008   A2GS 7.1 11/19/2008   BETS 4.0* 11/19/2008   BETA2SER 7.1* 11/19/2008   GAMS 36.7* 11/19/2008   MSPIKE NOT DET 11/19/2008   SPEI * 11/19/2008     Chemistry      Component Value Date/Time   NA 135 04/15/2015 0810   NA 136 10/03/2014 1512   K 4.5 04/15/2015 0810   K 4.7 10/03/2014 1512   CL 103 04/15/2015 0810   CL 105 10/03/2014 1512   CO2 22 04/15/2015 0810   CO2 20 10/03/2014 1512   BUN 44* 04/15/2015 0810   BUN 35* 10/03/2014 1512   CREATININE 2.7* 04/15/2015 0810   CREATININE 2.08* 10/03/2014 1512      Component Value Date/Time   CALCIUM 8.7 04/15/2015 0810   CALCIUM 8.6 10/03/2014 1512   ALKPHOS 111* 04/15/2015 0810   ALKPHOS 73 10/03/2014 1512   AST 23 04/15/2015 0810   AST 20 10/03/2014 1512   ALT 16 04/15/2015 0810   ALT 12 10/03/2014 1512   BILITOT 0.70 04/15/2015 0810   BILITOT 0.6 10/03/2014 1512     Impression and Plan: Tami [name] is 58 year old African female with anemia secondary to renal insufficiency. She also has a history of liver transplant secondary to autoimmune hepatitis. So far, there has been no evidence of rejection.  She is doing well at this time and only complains of the sinus infections. We will give her a prescription for a z-pack.  Her Hgb today is 8.9 so we will give her Aranesp.  We will plan to see her back in 2  months for labs and follow-up. She knows to call here with any questions or concerns. We can certainly see her back sooner if need be.   Eliezer Bottom, NP 10/5/201611:37 AM

## 2015-09-28 ENCOUNTER — Other Ambulatory Visit: Payer: Medicaid Other

## 2015-09-28 ENCOUNTER — Ambulatory Visit: Payer: Medicaid Other

## 2015-09-28 ENCOUNTER — Ambulatory Visit: Payer: Medicaid Other | Admitting: Hematology & Oncology

## 2015-09-29 ENCOUNTER — Ambulatory Visit (HOSPITAL_BASED_OUTPATIENT_CLINIC_OR_DEPARTMENT_OTHER): Payer: Medicaid Other | Admitting: Family

## 2015-09-29 ENCOUNTER — Other Ambulatory Visit (HOSPITAL_BASED_OUTPATIENT_CLINIC_OR_DEPARTMENT_OTHER): Payer: Medicaid Other

## 2015-09-29 ENCOUNTER — Encounter: Payer: Self-pay | Admitting: Family

## 2015-09-29 ENCOUNTER — Ambulatory Visit (HOSPITAL_BASED_OUTPATIENT_CLINIC_OR_DEPARTMENT_OTHER): Payer: Medicaid Other

## 2015-09-29 ENCOUNTER — Other Ambulatory Visit: Payer: Self-pay | Admitting: *Deleted

## 2015-09-29 VITALS — BP 141/84 | HR 88 | Temp 97.6°F | Resp 16 | Ht 60.0 in | Wt 149.0 lb

## 2015-09-29 DIAGNOSIS — D631 Anemia in chronic kidney disease: Secondary | ICD-10-CM

## 2015-09-29 DIAGNOSIS — N289 Disorder of kidney and ureter, unspecified: Secondary | ICD-10-CM

## 2015-09-29 DIAGNOSIS — N189 Chronic kidney disease, unspecified: Secondary | ICD-10-CM

## 2015-09-29 DIAGNOSIS — D649 Anemia, unspecified: Secondary | ICD-10-CM | POA: Diagnosis present

## 2015-09-29 DIAGNOSIS — D61818 Other pancytopenia: Secondary | ICD-10-CM

## 2015-09-29 DIAGNOSIS — Z944 Liver transplant status: Secondary | ICD-10-CM

## 2015-09-29 DIAGNOSIS — M5137 Other intervertebral disc degeneration, lumbosacral region: Secondary | ICD-10-CM

## 2015-09-29 DIAGNOSIS — K754 Autoimmune hepatitis: Secondary | ICD-10-CM

## 2015-09-29 LAB — COMPREHENSIVE METABOLIC PANEL
ALBUMIN: 3.6 g/dL (ref 3.5–5.0)
ALK PHOS: 113 U/L (ref 40–150)
ALT: <9 U/L (ref 0–55)
AST: 15 U/L (ref 5–34)
Anion Gap: 10 mEq/L (ref 3–11)
BUN: 35.2 mg/dL — AB (ref 7.0–26.0)
CALCIUM: 8.7 mg/dL (ref 8.4–10.4)
CHLORIDE: 107 meq/L (ref 98–109)
CO2: 21 mEq/L — ABNORMAL LOW (ref 22–29)
Creatinine: 2.2 mg/dL — ABNORMAL HIGH (ref 0.6–1.1)
EGFR: 27 mL/min/{1.73_m2} — AB (ref >90)
Glucose: 73 mg/dl (ref 70–140)
POTASSIUM: 4.7 meq/L (ref 3.5–5.1)
Sodium: 137 mEq/L (ref 136–145)
Total Bilirubin: 0.54 mg/dL (ref 0.20–1.20)
Total Protein: 8.6 g/dL — ABNORMAL HIGH (ref 6.4–8.3)

## 2015-09-29 LAB — IRON AND TIBC
%SAT: 58 % — AB (ref 21–57)
IRON: 104 ug/dL (ref 41–142)
TIBC: 179 ug/dL — AB (ref 236–444)
UIBC: 75 ug/dL — AB (ref 120–384)

## 2015-09-29 LAB — CBC WITH DIFFERENTIAL (CANCER CENTER ONLY)
BASO#: 0 10*3/uL (ref 0.0–0.2)
BASO%: 0 % (ref 0.0–2.0)
EOS ABS: 0.1 10*3/uL (ref 0.0–0.5)
EOS%: 2.5 % (ref 0.0–7.0)
HCT: 27.9 % — ABNORMAL LOW (ref 34.8–46.6)
HGB: 9.3 g/dL — ABNORMAL LOW (ref 11.6–15.9)
LYMPH#: 0.6 10*3/uL — ABNORMAL LOW (ref 0.9–3.3)
LYMPH%: 32.5 % (ref 14.0–48.0)
MCH: 30.1 pg (ref 26.0–34.0)
MCHC: 33.3 g/dL (ref 32.0–36.0)
MCV: 90 fL (ref 81–101)
MONO#: 0.3 10*3/uL (ref 0.1–0.9)
MONO%: 13.7 % — ABNORMAL HIGH (ref 0.0–13.0)
NEUT#: 1 10*3/uL — ABNORMAL LOW (ref 1.5–6.5)
NEUT%: 51.3 % (ref 39.6–80.0)
Platelets: 119 10*3/uL — ABNORMAL LOW (ref 145–400)
RBC: 3.09 10*6/uL — AB (ref 3.70–5.32)
RDW: 12.9 % (ref 11.1–15.7)
WBC: 2 10*3/uL — AB (ref 3.9–10.0)

## 2015-09-29 LAB — RETICULOCYTES
ABS Retic: 38 10*3/uL (ref 19.0–186.0)
RBC.: 3.17 MIL/uL — AB (ref 3.87–5.11)
RETIC CT PCT: 1.2 % (ref 0.4–2.3)

## 2015-09-29 LAB — FERRITIN: FERRITIN: 667 ng/mL — AB (ref 9–269)

## 2015-09-29 LAB — CHCC SATELLITE - SMEAR

## 2015-09-29 MED ORDER — DARBEPOETIN ALFA 300 MCG/0.6ML IJ SOSY
300.0000 ug | PREFILLED_SYRINGE | Freq: Once | INTRAMUSCULAR | Status: AC
  Administered 2015-09-29: 300 ug via SUBCUTANEOUS

## 2015-09-29 MED ORDER — DARBEPOETIN ALFA 300 MCG/0.6ML IJ SOSY
PREFILLED_SYRINGE | INTRAMUSCULAR | Status: AC
  Filled 2015-09-29: qty 0.6

## 2015-09-29 MED ORDER — ZOLPIDEM TARTRATE 5 MG PO TABS: 5.0000 mg | ORAL_TABLET | Freq: Every evening | ORAL | Status: DC | PRN

## 2015-09-29 NOTE — Patient Instructions (Signed)
Darbepoetin Alfa injection What is this medicine? DARBEPOETIN ALFA (dar be POE e tin AL fa) helps your body make more red blood cells. It is used to treat anemia caused by chronic kidney failure and chemotherapy. This medicine may be used for other purposes; ask your health care provider or pharmacist if you have questions. What should I tell my health care provider before I take this medicine? They need to know if you have any of these conditions: -blood clotting disorders or history of blood clots -cancer patient not on chemotherapy -cystic fibrosis -heart disease, such as angina, heart failure, or a history of a heart attack -hemoglobin level of 12 g/dL or greater -high blood pressure -low levels of folate, iron, or vitamin B12 -seizures -an unusual or allergic reaction to darbepoetin, erythropoietin, albumin, hamster proteins, latex, other medicines, foods, dyes, or preservatives -pregnant or trying to get pregnant -breast-feeding How should I use this medicine? This medicine is for injection into a vein or under the skin. It is usually given by a health care professional in a hospital or clinic setting. If you get this medicine at home, you will be taught how to prepare and give this medicine. Do not shake the solution before you withdraw a dose. Use exactly as directed. Take your medicine at regular intervals. Do not take your medicine more often than directed. It is important that you put your used needles and syringes in a special sharps container. Do not put them in a trash can. If you do not have a sharps container, call your pharmacist or healthcare provider to get one. Talk to your pediatrician regarding the use of this medicine in children. While this medicine may be used in children as young as 1 year for selected conditions, precautions do apply. Overdosage: If you think you have taken too much of this medicine contact a poison control center or emergency room at once. NOTE:  This medicine is only for you. Do not share this medicine with others. What if I miss a dose? If you miss a dose, take it as soon as you can. If it is almost time for your next dose, take only that dose. Do not take double or extra doses. What may interact with this medicine? Do not take this medicine with any of the following medications: -epoetin alfa This list may not describe all possible interactions. Give your health care provider a list of all the medicines, herbs, non-prescription drugs, or dietary supplements you use. Also tell them if you smoke, drink alcohol, or use illegal drugs. Some items may interact with your medicine. What should I watch for while using this medicine? Visit your prescriber or health care professional for regular checks on your progress and for the needed blood tests and blood pressure measurements. It is especially important for the doctor to make sure your hemoglobin level is in the desired range, to limit the risk of potential side effects and to give you the best benefit. Keep all appointments for any recommended tests. Check your blood pressure as directed. Ask your doctor what your blood pressure should be and when you should contact him or her. As your body makes more red blood cells, you may need to take iron, folic acid, or vitamin B supplements. Ask your doctor or health care provider which products are right for you. If you have kidney disease continue dietary restrictions, even though this medication can make you feel better. Talk with your doctor or health care professional about the   foods you eat and the vitamins that you take. What side effects may I notice from receiving this medicine? Side effects that you should report to your doctor or health care professional as soon as possible: -allergic reactions like skin rash, itching or hives, swelling of the face, lips, or tongue -breathing problems -changes in vision -chest pain -confusion, trouble speaking  or understanding -feeling faint or lightheaded, falls -high blood pressure -muscle aches or pains -pain, swelling, warmth in the leg -rapid weight gain -severe headaches -sudden numbness or weakness of the face, arm or leg -trouble walking, dizziness, loss of balance or coordination -seizures (convulsions) -swelling of the ankles, feet, hands -unusually weak or tired Side effects that usually do not require medical attention (report to your doctor or health care professional if they continue or are bothersome): -diarrhea -fever, chills (flu-like symptoms) -headaches -nausea, vomiting -redness, stinging, or swelling at site where injected This list may not describe all possible side effects. Call your doctor for medical advice about side effects. You may report side effects to FDA at 1-800-FDA-1088. Where should I keep my medicine? Keep out of the reach of children. Store in a refrigerator between 2 and 8 degrees C (36 and 46 degrees F). Do not freeze. Do not shake. Throw away any unused portion if using a single-dose vial. Throw away any unused medicine after the expiration date. NOTE: This sheet is a summary. It may not cover all possible information. If you have questions about this medicine, talk to your doctor, pharmacist, or health care provider.    2016, Elsevier/Gold Standard. (2008-09-23 10:23:57)  

## 2015-09-29 NOTE — Progress Notes (Signed)
Hematology and Oncology Follow Up Visit  Tami [name] [id number] 1956/01/21 59 y.o. 09/29/2015  Principle Diagnosis:  Chronic pancytopenia.  Status post liver transplant for autoimmune hepatitis.  Renal insufficiency with anemia.  Current Therapy:   Aranesp 300 mcg subcu as needed for hemoglobin less than 10.    Interim History: Tami [name] is here today for a follow-up. Tami [name] is still having problems sleeping at night and feeling fatigued. Tami [name] states that Tami [name] does nap a good bit during the day and Tami [name] schedule is off.  Tami [name] does have a hard time during the holidays missing Tami [name] daughter who works in the [place]. Tami [name] states that Tami [name] daughter is also having a hard time. Tami [name] does have a son that lives with Tami [name] to keep Tami [name] company and two grandchildren. Tami [name] is still followed by [facility] for Tami [name] liver transplant periodically. Tami [name] LFTs have good.  No n/v, rash, headache, dizziness, SOB, chest pain, palpitations, abdominal pain, changes in bowel or bladder habits.  Tami [name] has had no swelling, tenderness, numbness or tingling in Tami [name] extremities. No new aches or pains.  Tami [name] is eating well and staying hydrated. Tami [name] weight is stable.  Tami [name] has a healthy appetite and [name] staying well hydrated.   Medications:    Medication List       This list is accurate as of: 09/29/15 11:07 AM.  Always use your most recent med list.               allopurinol 100 MG tablet  Commonly known as:  ZYLOPRIM  Take 100 mg by mouth daily.     amLODipine 10 MG tablet  Commonly known as:  NORVASC  Take 10 mg by mouth daily.     azithromycin 250 MG tablet  Commonly known as:  ZITHROMAX Z-PAK  Take as directed on package.     calcitonin (salmon) 200 UNIT/ACT nasal spray  Commonly known as:  MIACALCIN/FORTICAL  Place 1 spray into alternate nostrils daily.     CALCIUM 1200 PO  Take by mouth 2 (two) times daily.     cholecalciferol 1000 UNITS tablet  Commonly known as:  VITAMIN D  Take 1,000 Units by mouth  daily.     gabapentin 300 MG capsule  Commonly known as:  NEURONTIN  Take 1 capsule (300 mg total) by mouth 2 (two) times daily.     hydrochlorothiazide 12.5 MG tablet  Commonly known as:  HYDRODIURIL  Take 2 tablets (25 mg total) by mouth daily.     metoprolol succinate 50 MG 24 hr tablet  Commonly known as:  TOPROL-XL  Take 50 mg by mouth daily.     multivitamin with minerals tablet  Take 1 tablet by mouth daily.     NEORAL 25 MG capsule  Generic drug:  cycloSPORINE modified  Take 25 mg by mouth 2 (two) times daily. 3 tabs bid     oxyCODONE 15 MG immediate release tablet  Commonly known as:  ROXICODONE  Take 15 mg by mouth every 6 (six) hours as needed.     promethazine 12.5 MG tablet  Commonly known as:  PHENERGAN  Take 1 tablet (12.5 mg total) by mouth every 6 (six) hours as needed.     Vitamin C 250 MG Chew  Chew by mouth every morning.     zolpidem 5 MG tablet  Commonly known as:  AMBIEN  Take 1 tablet (5 mg total) by mouth at bedtime as needed for sleep.  Allergies: No Known Allergies  Past Medical History, Surgical history, Social history, and Family History were reviewed and updated.  Review of Systems: All other 10 point review of systems is negative.   Physical Exam:  height is 5' (1.524 m) and weight is 149 lb (67.586 kg). Tami [name] oral temperature is 97.6 F (36.4 C). Tami [name] blood pressure is 141/84 and Tami [name] pulse is 88. Tami [name] respiration is 16.   Wt Readings from Last 3 Encounters:  09/29/15 149 lb (67.586 kg)  07/29/15 152 lb (68.947 kg)  05/27/15 155 lb (70.308 kg)    Ocular: Sclerae unicteric, pupils equal, round and reactive to light Ear-nose-throat: Oropharynx clear, dentition fair Lymphatic: No cervical supraclavicular or axillary adenopathy Lungs no rales or rhonchi, good excursion bilaterally Heart regular rate and rhythm, no murmur appreciated Abd soft, nontender, positive bowel sounds MSK no focal spinal tenderness, no joint edema Neuro:  non-focal, well-oriented, appropriate affect Breasts: Deferred  Lab Results  Component Value Date   WBC 2.5* 07/29/2015   HGB 8.9* 07/29/2015   HCT 27.3* 07/29/2015   MCV 92 07/29/2015   PLT 102* 07/29/2015   Lab Results  Component Value Date   FERRITIN 554* 07/29/2015   IRON 104 07/29/2015   TIBC 159* 07/29/2015   UIBC 55* 07/29/2015   IRONPCTSAT 65* 07/29/2015   Lab Results  Component Value Date   RETICCTPCT 1.0 07/29/2015   RBC 2.97* 07/29/2015   RETICCTABS 29.7 07/29/2015   No results found for: KPAFRELGTCHN, LAMBDASER, KAPLAMBRATIO No results found for: Kandis Cocking, [facility] Lab Results  Component Value Date   TOTALPROTELP 9.4* 11/19/2008   ALBUMINELP 41.1* 11/19/2008   A1GS 4.0 11/19/2008   A2GS 7.1 11/19/2008   BETS 4.0* 11/19/2008   BETA2SER 7.1* 11/19/2008   GAMS 36.7* 11/19/2008   MSPIKE NOT DET 11/19/2008   SPEI * 11/19/2008     Chemistry      Component Value Date/Time   NA 137 07/29/2015 1044   NA 135 04/15/2015 0810   NA 136 10/03/2014 1512   K 4.9 07/29/2015 1044   K 4.5 04/15/2015 0810   K 4.7 10/03/2014 1512   CL 103 04/15/2015 0810   CL 105 10/03/2014 1512   CO2 20* 07/29/2015 1044   CO2 22 04/15/2015 0810   CO2 20 10/03/2014 1512   BUN 56.1* 07/29/2015 1044   BUN 44* 04/15/2015 0810   BUN 35* 10/03/2014 1512   CREATININE 3.0* 07/29/2015 1044   CREATININE 2.7* 04/15/2015 0810   CREATININE 2.08* 10/03/2014 1512      Component Value Date/Time   CALCIUM 8.3* 07/29/2015 1044   CALCIUM 8.7 04/15/2015 0810   CALCIUM 8.6 10/03/2014 1512   ALKPHOS 110 07/29/2015 1044   ALKPHOS 111* 04/15/2015 0810   ALKPHOS 73 10/03/2014 1512   AST 14 07/29/2015 1044   AST 23 04/15/2015 0810   AST 20 10/03/2014 1512   ALT 10 07/29/2015 1044   ALT 16 04/15/2015 0810   ALT 12 10/03/2014 1512   BILITOT 0.52 07/29/2015 1044   BILITOT 0.70 04/15/2015 0810   BILITOT 0.6 10/03/2014 1512     Impression and Plan: Tami [name] is 59 year old African female  with anemia secondary to renal insufficiency. Tami [name] also has a history of liver transplant secondary to autoimmune hepatitis. So far, there has been no evidence of rejection and Tami [name] is followed regularly by Tami [name] doctors at [facility].  Tami [name] is not sleeping well at night and is having some mild fatigue at times.  Tami [name] Hgb  today is 9.3 so we will give Tami [name] Aranesp.  We will plan to see Tami [name] back in 2 months for labs and follow-up. Tami [name] knows to call here with any questions or concerns. We can certainly see Tami [name] back sooner if need be.   Eliezer Bottom, NP 12/6/201611:07 AM

## 2015-12-01 ENCOUNTER — Ambulatory Visit (HOSPITAL_BASED_OUTPATIENT_CLINIC_OR_DEPARTMENT_OTHER): Payer: Medicaid Other

## 2015-12-01 ENCOUNTER — Other Ambulatory Visit (HOSPITAL_BASED_OUTPATIENT_CLINIC_OR_DEPARTMENT_OTHER): Payer: Medicaid Other

## 2015-12-01 ENCOUNTER — Ambulatory Visit (HOSPITAL_BASED_OUTPATIENT_CLINIC_OR_DEPARTMENT_OTHER): Payer: Medicaid Other | Admitting: Family

## 2015-12-01 ENCOUNTER — Other Ambulatory Visit: Payer: Self-pay | Admitting: *Deleted

## 2015-12-01 VITALS — BP 141/83 | HR 72 | Temp 98.3°F | Resp 20 | Wt 151.0 lb

## 2015-12-01 DIAGNOSIS — N189 Chronic kidney disease, unspecified: Principal | ICD-10-CM

## 2015-12-01 DIAGNOSIS — D649 Anemia, unspecified: Secondary | ICD-10-CM | POA: Diagnosis not present

## 2015-12-01 DIAGNOSIS — D61818 Other pancytopenia: Secondary | ICD-10-CM

## 2015-12-01 DIAGNOSIS — D631 Anemia in chronic kidney disease: Secondary | ICD-10-CM

## 2015-12-01 DIAGNOSIS — N289 Disorder of kidney and ureter, unspecified: Secondary | ICD-10-CM | POA: Diagnosis present

## 2015-12-01 DIAGNOSIS — K754 Autoimmune hepatitis: Secondary | ICD-10-CM

## 2015-12-01 DIAGNOSIS — Z944 Liver transplant status: Secondary | ICD-10-CM | POA: Diagnosis not present

## 2015-12-01 LAB — CBC WITH DIFFERENTIAL (CANCER CENTER ONLY)
BASO#: 0 10*3/uL (ref 0.0–0.2)
BASO%: 0.4 % (ref 0.0–2.0)
EOS%: 3.7 % (ref 0.0–7.0)
Eosinophils Absolute: 0.1 10*3/uL (ref 0.0–0.5)
HCT: 28.8 % — ABNORMAL LOW (ref 34.8–46.6)
HGB: 9.5 g/dL — ABNORMAL LOW (ref 11.6–15.9)
LYMPH#: 0.7 10*3/uL — ABNORMAL LOW (ref 0.9–3.3)
LYMPH%: 28.8 % (ref 14.0–48.0)
MCH: 29.8 pg (ref 26.0–34.0)
MCHC: 33 g/dL (ref 32.0–36.0)
MCV: 90 fL (ref 81–101)
MONO#: 0.4 10*3/uL (ref 0.1–0.9)
MONO%: 14.4 % — ABNORMAL HIGH (ref 0.0–13.0)
NEUT#: 1.3 10*3/uL — ABNORMAL LOW (ref 1.5–6.5)
NEUT%: 52.7 % (ref 39.6–80.0)
PLATELETS: 119 10*3/uL — AB (ref 145–400)
RBC: 3.19 10*6/uL — ABNORMAL LOW (ref 3.70–5.32)
RDW: 12.8 % (ref 11.1–15.7)
WBC: 2.4 10*3/uL — ABNORMAL LOW (ref 3.9–10.0)

## 2015-12-01 LAB — CHCC SATELLITE - SMEAR

## 2015-12-01 MED ORDER — DARBEPOETIN ALFA 300 MCG/0.6ML IJ SOSY
300.0000 ug | PREFILLED_SYRINGE | Freq: Once | INTRAMUSCULAR | Status: AC
  Administered 2015-12-01: 300 ug via SUBCUTANEOUS

## 2015-12-01 MED ORDER — DARBEPOETIN ALFA 300 MCG/0.6ML IJ SOSY
PREFILLED_SYRINGE | INTRAMUSCULAR | Status: AC
  Filled 2015-12-01: qty 0.6

## 2015-12-01 NOTE — Progress Notes (Signed)
Hematology and Oncology Follow Up Visit  Atiya Iliff [id number] 22-Aug-1956 60 y.o. 12/01/2015  Principle Diagnosis:  Chronic pancytopenia.  Status post liver transplant for autoimmune hepatitis.  Renal insufficiency with anemia.  Current Therapy:   Aranesp 300 mcg subcu as needed for hemoglobin less than 10.    Interim History: [name] is here today for a follow-up. She is doing well and has no complaints at this time. She states that her energy has improved and she is sleeping better at night. She no longer takes naps during the day. She will follow up with her transplant doctors with [facility] on February 13. She has done well since having her liver transplant and so far her LFTs have been stable. No fever, chills, n/v, cough, rash, headache, dizziness, SOB, chest pain, palpitations, abdominal pain, changes in bowel or bladder habits.  No numbness or tingling in her extremities at this time. No lymphadenopathy found on exam.  She is maintained a good appetite and is staying well-hydrated. Her weight is stable. She is excited for her daughter to come visit in August.  Medications:    Medication List       This list is accurate as of: 12/01/15  1:11 PM.  Always use your most recent med list.               alendronate 70 MG tablet  Commonly known as:  FOSAMAX  Take 70 mg by mouth once a week.     allopurinol 100 MG tablet  Commonly known as:  ZYLOPRIM  Take 100 mg by mouth daily.     amLODipine 10 MG tablet  Commonly known as:  NORVASC  Take 10 mg by mouth daily.     calcitonin (salmon) 200 UNIT/ACT nasal spray  Commonly known as:  MIACALCIN/FORTICAL  Place 1 spray into alternate nostrils daily.     CALCIUM 1200 PO  Take by mouth 2 (two) times daily.     gabapentin 300 MG capsule  Commonly known as:  NEURONTIN  Take 1 capsule (300 mg total) by mouth 2 (two) times daily.     hydrochlorothiazide 12.5 MG tablet  Commonly known as:  HYDRODIURIL  Take 2 tablets  (25 mg total) by mouth daily.     metoprolol succinate 50 MG 24 hr tablet  Commonly known as:  TOPROL-XL  Take 50 mg by mouth daily.     multivitamin with minerals tablet  Take 1 tablet by mouth daily.     NEORAL 25 MG capsule  Generic drug:  cycloSPORINE modified  Take 25 mg by mouth 2 (two) times daily. Takes 3 tabs in the morning and 2 tabs at bedtime.     ondansetron 4 MG disintegrating tablet  Commonly known as:  ZOFRAN-ODT  Take 4 mg by mouth every 8 (eight) hours as needed for nausea or vomiting.     oxyCODONE 15 MG immediate release tablet  Commonly known as:  ROXICODONE  Take 15 mg by mouth every 6 (six) hours as needed.     promethazine 12.5 MG tablet  Commonly known as:  PHENERGAN  Take 1 tablet (12.5 mg total) by mouth every 6 (six) hours as needed.     Vitamin C 250 MG Chew  Chew by mouth every morning.     Vitamin D (Ergocalciferol) 50000 units Caps capsule  Commonly known as:  DRISDOL  Take 50,000 Units by mouth every 14 (fourteen) days.     VOLTAREN 1 % Gel  Generic drug:  diclofenac sodium  Note:Apply 4 grams to painful knees, up to 4 times daily, as needed     zolpidem 5 MG tablet  Commonly known as:  AMBIEN  Take 1 tablet (5 mg total) by mouth at bedtime as needed for sleep.       Allergies:  Allergies  Allergen Reactions  . Iodinated Diagnostic Agents     Past Medical History, Surgical history, Social history, and Family History were reviewed and updated.  Review of Systems: All other 10 point review of systems is negative.   Physical Exam:  weight is 151 lb (68.493 kg). Her oral temperature is 98.3 F (36.8 C). Her blood pressure is 141/83 and her pulse is 72. Her respiration is 20.   Wt Readings from Last 3 Encounters:  12/01/15 151 lb (68.493 kg)  09/29/15 149 lb (67.586 kg)  07/29/15 152 lb (68.947 kg)    Ocular: Sclerae unicteric, pupils equal, round and reactive to light Ear-nose-throat: Oropharynx clear, dentition  fair Lymphatic: No cervical supraclavicular or axillary adenopathy Lungs no rales or rhonchi, good excursion bilaterally Heart regular rate and rhythm, no murmur appreciated Abd soft, nontender, positive bowel sounds MSK no focal spinal tenderness, no joint edema Neuro: non-focal, well-oriented, appropriate affect Breasts: Deferred  Lab Results  Component Value Date   WBC 2.4* 12/01/2015   HGB 9.5* 12/01/2015   HCT 28.8* 12/01/2015   MCV 90 12/01/2015   PLT 119* 12/01/2015   Lab Results  Component Value Date   FERRITIN 667* 09/29/2015   IRON 104 09/29/2015   TIBC 179* 09/29/2015   UIBC 75* 09/29/2015   IRONPCTSAT 58* 09/29/2015   Lab Results  Component Value Date   RETICCTPCT 1.2 09/29/2015   RBC 3.19* 12/01/2015   RETICCTABS 38.0 09/29/2015   No results found for: KPAFRELGTCHN, LAMBDASER, KAPLAMBRATIO No results found for: Kandis Cocking, IGMSERUM Lab Results  Component Value Date   TOTALPROTELP 9.4* 11/19/2008   ALBUMINELP 41.1* 11/19/2008   A1GS 4.0 11/19/2008   A2GS 7.1 11/19/2008   BETS 4.0* 11/19/2008   BETA2SER 7.1* 11/19/2008   GAMS 36.7* 11/19/2008   MSPIKE NOT DET 11/19/2008   SPEI * 11/19/2008     Chemistry      Component Value Date/Time   NA 137 09/29/2015 1045   NA 135 04/15/2015 0810   NA 136 10/03/2014 1512   K 4.7 09/29/2015 1045   K 4.5 04/15/2015 0810   K 4.7 10/03/2014 1512   CL 103 04/15/2015 0810   CL 105 10/03/2014 1512   CO2 21* 09/29/2015 1045   CO2 22 04/15/2015 0810   CO2 20 10/03/2014 1512   BUN 35.2* 09/29/2015 1045   BUN 44* 04/15/2015 0810   BUN 35* 10/03/2014 1512   CREATININE 2.2* 09/29/2015 1045   CREATININE 2.7* 04/15/2015 0810   CREATININE 2.08* 10/03/2014 1512      Component Value Date/Time   CALCIUM 8.7 09/29/2015 1045   CALCIUM 8.7 04/15/2015 0810   CALCIUM 8.6 10/03/2014 1512   ALKPHOS 113 09/29/2015 1045   ALKPHOS 111* 04/15/2015 0810   ALKPHOS 73 10/03/2014 1512   AST 15 09/29/2015 1045   AST 23  04/15/2015 0810   AST 20 10/03/2014 1512   ALT <9 09/29/2015 1045   ALT 16 04/15/2015 0810   ALT 12 10/03/2014 1512   BILITOT 0.54 09/29/2015 1045   BILITOT 0.70 04/15/2015 0810   BILITOT 0.6 10/03/2014 1512     Impression and Plan: Ms. Pettus is 60 year old African female with anemia  secondary to renal insufficiency. She also has a history of liver transplant secondary to autoimmune hepatitis. So far, there has been no evidence of rejection. She will follow-up with her transplant doctors at [facility] on February 13. She is doing well and is asymptomatic at this time. Her Hgb is 9.5 so we will receive Aranesp.  We will plan to see her back in 2 months for labs and follow-up. She knows to call here with any questions or concerns. We can certainly see her back sooner if need be.   Eliezer Bottom, NP 2/7/20171:11 PM

## 2015-12-01 NOTE — Patient Instructions (Signed)
Darbepoetin Alfa injection What is this medicine? DARBEPOETIN ALFA (dar be POE e tin AL fa) helps your body make more red blood cells. It is used to treat anemia caused by chronic kidney failure and chemotherapy. This medicine may be used for other purposes; ask your health care provider or pharmacist if you have questions. What should I tell my health care provider before I take this medicine? They need to know if you have any of these conditions: -blood clotting disorders or history of blood clots -cancer patient not on chemotherapy -cystic fibrosis -heart disease, such as angina, heart failure, or a history of a heart attack -hemoglobin level of 12 g/dL or greater -high blood pressure -low levels of folate, iron, or vitamin B12 -seizures -an unusual or allergic reaction to darbepoetin, erythropoietin, albumin, hamster proteins, latex, other medicines, foods, dyes, or preservatives -pregnant or trying to get pregnant -breast-feeding How should I use this medicine? This medicine is for injection into a vein or under the skin. It is usually given by a health care professional in a hospital or clinic setting. If you get this medicine at home, you will be taught how to prepare and give this medicine. Do not shake the solution before you withdraw a dose. Use exactly as directed. Take your medicine at regular intervals. Do not take your medicine more often than directed. It is important that you put your used needles and syringes in a special sharps container. Do not put them in a trash can. If you do not have a sharps container, call your pharmacist or healthcare provider to get one. Talk to your pediatrician regarding the use of this medicine in children. While this medicine may be used in children as young as 1 year for selected conditions, precautions do apply. Overdosage: If you think you have taken too much of this medicine contact a poison control center or emergency room at once. NOTE:  This medicine is only for you. Do not share this medicine with others. What if I miss a dose? If you miss a dose, take it as soon as you can. If it is almost time for your next dose, take only that dose. Do not take double or extra doses. What may interact with this medicine? Do not take this medicine with any of the following medications: -epoetin alfa This list may not describe all possible interactions. Give your health care provider a list of all the medicines, herbs, non-prescription drugs, or dietary supplements you use. Also tell them if you smoke, drink alcohol, or use illegal drugs. Some items may interact with your medicine. What should I watch for while using this medicine? Visit your prescriber or health care professional for regular checks on your progress and for the needed blood tests and blood pressure measurements. It is especially important for the doctor to make sure your hemoglobin level is in the desired range, to limit the risk of potential side effects and to give you the best benefit. Keep all appointments for any recommended tests. Check your blood pressure as directed. Ask your doctor what your blood pressure should be and when you should contact him or her. As your body makes more red blood cells, you may need to take iron, folic acid, or vitamin B supplements. Ask your doctor or health care provider which products are right for you. If you have kidney disease continue dietary restrictions, even though this medication can make you feel better. Talk with your doctor or health care professional about the   foods you eat and the vitamins that you take. What side effects may I notice from receiving this medicine? Side effects that you should report to your doctor or health care professional as soon as possible: -allergic reactions like skin rash, itching or hives, swelling of the face, lips, or tongue -breathing problems -changes in vision -chest pain -confusion, trouble speaking  or understanding -feeling faint or lightheaded, falls -high blood pressure -muscle aches or pains -pain, swelling, warmth in the leg -rapid weight gain -severe headaches -sudden numbness or weakness of the face, arm or leg -trouble walking, dizziness, loss of balance or coordination -seizures (convulsions) -swelling of the ankles, feet, hands -unusually weak or tired Side effects that usually do not require medical attention (report to your doctor or health care professional if they continue or are bothersome): -diarrhea -fever, chills (flu-like symptoms) -headaches -nausea, vomiting -redness, stinging, or swelling at site where injected This list may not describe all possible side effects. Call your doctor for medical advice about side effects. You may report side effects to FDA at 1-800-FDA-1088. Where should I keep my medicine? Keep out of the reach of children. Store in a refrigerator between 2 and 8 degrees C (36 and 46 degrees F). Do not freeze. Do not shake. Throw away any unused portion if using a single-dose vial. Throw away any unused medicine after the expiration date. NOTE: This sheet is a summary. It may not cover all possible information. If you have questions about this medicine, talk to your doctor, pharmacist, or health care provider.    2016, Elsevier/Gold Standard. (2008-09-23 10:23:57)  

## 2015-12-02 ENCOUNTER — Other Ambulatory Visit: Payer: Self-pay | Admitting: *Deleted

## 2015-12-02 ENCOUNTER — Other Ambulatory Visit (HOSPITAL_BASED_OUTPATIENT_CLINIC_OR_DEPARTMENT_OTHER): Payer: Medicaid Other

## 2015-12-02 DIAGNOSIS — K754 Autoimmune hepatitis: Secondary | ICD-10-CM | POA: Diagnosis present

## 2015-12-02 LAB — CMP (CANCER CENTER ONLY)
ALBUMIN: 3.3 g/dL (ref 3.3–5.5)
ALK PHOS: 102 U/L — AB (ref 26–84)
ALT: 13 U/L (ref 10–47)
AST: 24 U/L (ref 11–38)
BILIRUBIN TOTAL: 0.5 mg/dL (ref 0.20–1.60)
BUN: 25 mg/dL — AB (ref 7–22)
CO2: 23 mEq/L (ref 18–33)
CREATININE: 1.7 mg/dL — AB (ref 0.6–1.2)
Calcium: 8.4 mg/dL (ref 8.0–10.3)
Chloride: 107 mEq/L (ref 98–108)
Glucose, Bld: 84 mg/dL (ref 73–118)
Potassium: 4.7 mEq/L (ref 3.3–4.7)
SODIUM: 134 meq/L (ref 128–145)
TOTAL PROTEIN: 8.3 g/dL — AB (ref 6.4–8.1)

## 2015-12-02 LAB — RETICULOCYTES: RETICULOCYTE COUNT: 1.3 % (ref 0.6–2.6)

## 2016-02-02 ENCOUNTER — Ambulatory Visit (HOSPITAL_BASED_OUTPATIENT_CLINIC_OR_DEPARTMENT_OTHER): Payer: Medicaid Other

## 2016-02-02 ENCOUNTER — Other Ambulatory Visit (HOSPITAL_BASED_OUTPATIENT_CLINIC_OR_DEPARTMENT_OTHER): Payer: Medicaid Other

## 2016-02-02 ENCOUNTER — Encounter: Payer: Self-pay | Admitting: Family

## 2016-02-02 ENCOUNTER — Other Ambulatory Visit (HOSPITAL_BASED_OUTPATIENT_CLINIC_OR_DEPARTMENT_OTHER): Payer: Self-pay | Admitting: Internal Medicine

## 2016-02-02 ENCOUNTER — Ambulatory Visit (HOSPITAL_BASED_OUTPATIENT_CLINIC_OR_DEPARTMENT_OTHER): Payer: Medicaid Other | Admitting: Family

## 2016-02-02 VITALS — BP 143/85 | HR 79 | Temp 97.9°F | Resp 14 | Ht 60.0 in | Wt 155.0 lb

## 2016-02-02 DIAGNOSIS — Z1231 Encounter for screening mammogram for malignant neoplasm of breast: Secondary | ICD-10-CM

## 2016-02-02 DIAGNOSIS — N189 Chronic kidney disease, unspecified: Secondary | ICD-10-CM

## 2016-02-02 DIAGNOSIS — K754 Autoimmune hepatitis: Secondary | ICD-10-CM

## 2016-02-02 DIAGNOSIS — D631 Anemia in chronic kidney disease: Secondary | ICD-10-CM

## 2016-02-02 DIAGNOSIS — D61818 Other pancytopenia: Secondary | ICD-10-CM

## 2016-02-02 DIAGNOSIS — Z944 Liver transplant status: Secondary | ICD-10-CM

## 2016-02-02 LAB — COMPREHENSIVE METABOLIC PANEL
ALBUMIN: 3.2 g/dL — AB (ref 3.5–5.0)
ALT: <9 U/L (ref 0–55)
ANION GAP: 7 meq/L (ref 3–11)
AST: 15 U/L (ref 5–34)
Alkaline Phosphatase: 89 U/L (ref 40–150)
BILIRUBIN TOTAL: 0.38 mg/dL (ref 0.20–1.20)
BUN: 23.1 mg/dL (ref 7.0–26.0)
CALCIUM: 8.5 mg/dL (ref 8.4–10.4)
CO2: 21 meq/L — AB (ref 22–29)
Chloride: 109 mEq/L (ref 98–109)
Creatinine: 1.9 mg/dL — ABNORMAL HIGH (ref 0.6–1.1)
EGFR: 33 mL/min/{1.73_m2} — ABNORMAL LOW (ref >90)
Glucose: 95 mg/dl (ref 70–140)
Potassium: 4.8 mEq/L (ref 3.5–5.1)
Sodium: 137 mEq/L (ref 136–145)
TOTAL PROTEIN: 8.2 g/dL (ref 6.4–8.3)

## 2016-02-02 LAB — CBC WITH DIFFERENTIAL (CANCER CENTER ONLY)
BASO#: 0 10*3/uL (ref 0.0–0.2)
BASO%: 0.3 % (ref 0.0–2.0)
EOS%: 2 % (ref 0.0–7.0)
Eosinophils Absolute: 0.1 10*3/uL (ref 0.0–0.5)
HCT: 27.1 % — ABNORMAL LOW (ref 34.8–46.6)
HEMOGLOBIN: 9 g/dL — AB (ref 11.6–15.9)
LYMPH#: 0.7 10*3/uL — ABNORMAL LOW (ref 0.9–3.3)
LYMPH%: 24.2 % (ref 14.0–48.0)
MCH: 30 pg (ref 26.0–34.0)
MCHC: 33.2 g/dL (ref 32.0–36.0)
MCV: 90 fL (ref 81–101)
MONO#: 0.4 10*3/uL (ref 0.1–0.9)
MONO%: 13.8 % — AB (ref 0.0–13.0)
NEUT%: 59.7 % (ref 39.6–80.0)
NEUTROS ABS: 1.8 10*3/uL (ref 1.5–6.5)
Platelets: 111 10*3/uL — ABNORMAL LOW (ref 145–400)
RBC: 3 10*6/uL — AB (ref 3.70–5.32)
RDW: 13.1 % (ref 11.1–15.7)
WBC: 3 10*3/uL — AB (ref 3.9–10.0)

## 2016-02-02 LAB — IRON AND TIBC
%SAT: 28 % (ref 21–57)
IRON: 45 ug/dL (ref 41–142)
TIBC: 163 ug/dL — AB (ref 236–444)
UIBC: 117 ug/dL — ABNORMAL LOW (ref 120–384)

## 2016-02-02 LAB — FERRITIN: FERRITIN: 651 ng/mL — AB (ref 9–269)

## 2016-02-02 LAB — CHCC SATELLITE - SMEAR

## 2016-02-02 LAB — RETICULOCYTES: Reticulocyte Count: 1.3 % (ref 0.6–2.6)

## 2016-02-02 MED ORDER — DARBEPOETIN ALFA 300 MCG/0.6ML IJ SOSY
PREFILLED_SYRINGE | INTRAMUSCULAR | Status: AC
  Filled 2016-02-02: qty 0.6

## 2016-02-02 MED ORDER — DARBEPOETIN ALFA 300 MCG/0.6ML IJ SOSY
300.0000 ug | PREFILLED_SYRINGE | Freq: Once | INTRAMUSCULAR | Status: AC
  Administered 2016-02-02: 300 ug via SUBCUTANEOUS

## 2016-02-02 NOTE — Progress Notes (Signed)
Hematology and Oncology Follow Up Visit  Tami [name] [id number] 02-11-1956 60 y.o. 02/02/2016  Principle Diagnosis:  Chronic pancytopenia.  Status post liver transplant for autoimmune hepatitis.  Renal insufficiency with anemia.  Current Therapy:   Aranesp 300 mcg subcu as needed for hemoglobin less than 10.    Interim History: Tami [name] is here today for a follow-up. She continues to do well and has no complaints. Her Hgb is stable at 9.0 with an MCV of 90. She has had no episodes of bleeding or bruising.  She was seen at [facility] in February and there has been no change to her treatment. Her LFTs in February were stable. Levels today are pending.  No fever, chills, n/v, cough, rash, headache, dizziness, SOB, chest pain, palpitations, abdominal pain or changes in bowel or bladder habits.  No numbness or tingling in her extremities at this time. She has chronic lower back and knee pain that comes and goes. No lymphadenopathy found on exam. She is maintained a good appetite and is staying well-hydrated. Her weight is stable. She enjoys getting out and walking with her grandson for exercise.   Medications:    Medication List       This list is accurate as of: 02/02/16 11:47 AM.  Always use your most recent med list.               alendronate 70 MG tablet  Commonly known as:  FOSAMAX  Take 70 mg by mouth once a week.     allopurinol 100 MG tablet  Commonly known as:  ZYLOPRIM  Take 100 mg by mouth daily.     amLODipine 5 MG tablet  Commonly known as:  NORVASC  Take 5 mg by mouth daily.     calcitonin (salmon) 200 UNIT/ACT nasal spray  Commonly known as:  MIACALCIN/FORTICAL  Place 1 spray into alternate nostrils daily.     CALCIUM 1200 PO  Take by mouth 2 (two) times daily.     gabapentin 300 MG capsule  Commonly known as:  NEURONTIN  Take 1 capsule (300 mg total) by mouth 2 (two) times daily.     hydrochlorothiazide 12.5 MG tablet  Commonly known as:   HYDRODIURIL  Take 2 tablets (25 mg total) by mouth daily.     metoprolol succinate 50 MG 24 hr tablet  Commonly known as:  TOPROL-XL  Take 50 mg by mouth daily.     multivitamin with minerals tablet  Take 1 tablet by mouth daily.     NEORAL 25 MG capsule  Generic drug:  cycloSPORINE modified  Take 25 mg by mouth 2 (two) times daily. Takes 3 tabs in the morning and 2 tabs at bedtime.     ondansetron 4 MG disintegrating tablet  Commonly known as:  ZOFRAN-ODT  Take 4 mg by mouth every 8 (eight) hours as needed for nausea or vomiting.     oxyCODONE 15 MG immediate release tablet  Commonly known as:  ROXICODONE  Take 15 mg by mouth every 6 (six) hours as needed.     promethazine 12.5 MG tablet  Commonly known as:  PHENERGAN  Take 1 tablet (12.5 mg total) by mouth every 6 (six) hours as needed.     Vitamin C 250 MG Chew  Chew by mouth every morning.     Vitamin D (Ergocalciferol) 50000 units Caps capsule  Commonly known as:  DRISDOL  Take 50,000 Units by mouth every 14 (fourteen) days.     VOLTAREN  1 % Gel  Generic drug:  diclofenac sodium  Note:Apply 4 grams to painful knees, up to 4 times daily, as needed     zolpidem 5 MG tablet  Commonly known as:  AMBIEN  Take 1 tablet (5 mg total) by mouth at bedtime as needed for sleep.       Allergies:  Allergies  Allergen Reactions  . Iodinated Diagnostic Agents     Past Medical History, Surgical history, Social history, and Family History were reviewed and updated.  Review of Systems: All other 10 point review of systems is negative.   Physical Exam:  height is 5' (1.524 m) and weight is 155 lb (70.308 kg). Her oral temperature is 97.9 F (36.6 C). Her blood pressure is 143/85 and her pulse is 79. Her respiration is 14.   Wt Readings from Last 3 Encounters:  02/02/16 155 lb (70.308 kg)  12/01/15 151 lb (68.493 kg)  09/29/15 149 lb (67.586 kg)    Ocular: Sclerae unicteric, pupils equal, round and reactive to  light Ear-nose-throat: Oropharynx clear, dentition fair Lymphatic: No cervical supraclavicular or axillary adenopathy Lungs no rales or rhonchi, good excursion bilaterally Heart regular rate and rhythm, no murmur appreciated Abd soft, nontender, positive bowel sounds, no liver or spleen tip palpated on exam, no fluid wave MSK no focal spinal tenderness, no joint edema Neuro: non-focal, well-oriented, appropriate affect Breasts: Deferred  Lab Results  Component Value Date   WBC 3.0* 02/02/2016   HGB 9.0* 02/02/2016   HCT 27.1* 02/02/2016   MCV 90 02/02/2016   PLT 111* 02/02/2016   Lab Results  Component Value Date   FERRITIN 667* 09/29/2015   IRON 104 09/29/2015   TIBC 179* 09/29/2015   UIBC 75* 09/29/2015   IRONPCTSAT 58* 09/29/2015   Lab Results  Component Value Date   RETICCTPCT 1.2 09/29/2015   RBC 3.00* 02/02/2016   RETICCTABS 38.0 09/29/2015   No results found for: KPAFRELGTCHN, LAMBDASER, KAPLAMBRATIO No results found for: Kandis Cocking, IGMSERUM Lab Results  Component Value Date   TOTALPROTELP 9.4* 11/19/2008   ALBUMINELP 41.1* 11/19/2008   A1GS 4.0 11/19/2008   A2GS 7.1 11/19/2008   BETS 4.0* 11/19/2008   BETA2SER 7.1* 11/19/2008   GAMS 36.7* 11/19/2008   MSPIKE NOT DET 11/19/2008   SPEI * 11/19/2008     Chemistry      Component Value Date/Time   NA 134 12/02/2015 1150   NA 137 09/29/2015 1045   NA 136 10/03/2014 1512   K 4.7 12/02/2015 1150   K 4.7 09/29/2015 1045   K 4.7 10/03/2014 1512   CL 107 12/02/2015 1150   CL 105 10/03/2014 1512   CO2 23 12/02/2015 1150   CO2 21* 09/29/2015 1045   CO2 20 10/03/2014 1512   BUN 25* 12/02/2015 1150   BUN 35.2* 09/29/2015 1045   BUN 35* 10/03/2014 1512   CREATININE 1.7* 12/02/2015 1150   CREATININE 2.2* 09/29/2015 1045   CREATININE 2.08* 10/03/2014 1512      Component Value Date/Time   CALCIUM 8.4 12/02/2015 1150   CALCIUM 8.7 09/29/2015 1045   CALCIUM 8.6 10/03/2014 1512   ALKPHOS 102* 12/02/2015  1150   ALKPHOS 113 09/29/2015 1045   ALKPHOS 73 10/03/2014 1512   AST 24 12/02/2015 1150   AST 15 09/29/2015 1045   AST 20 10/03/2014 1512   ALT 13 12/02/2015 1150   ALT <9 09/29/2015 1045   ALT 12 10/03/2014 1512   BILITOT 0.50 12/02/2015 1150   BILITOT  0.54 09/29/2015 1045   BILITOT 0.6 10/03/2014 1512     Impression and Plan: Ms. Gambrell is 60 year old African female with anemia secondary to renal insufficiency. She is doing well and has no complaints at this time.  She also has a history of liver transplant secondary to autoimmune hepatitis. So far, there has been no evidence of rejection. She continues to follow up with her transplant doctors at [facility] periodically.  Her Hgb is 9.0 so she will receive Aranesp.  We will plan to see her back in 2 months for labs and follow-up. She knows to call here with any questions or concerns. We can certainly see her back sooner if need be.   Eliezer Bottom, NP 4/11/201711:47 AM

## 2016-02-02 NOTE — Patient Instructions (Signed)
Darbepoetin Alfa injection What is this medicine? DARBEPOETIN ALFA (dar be POE e tin AL fa) helps your body make more red blood cells. It is used to treat anemia caused by chronic kidney failure and chemotherapy. This medicine may be used for other purposes; ask your health care provider or pharmacist if you have questions. What should I tell my health care provider before I take this medicine? They need to know if you have any of these conditions: -blood clotting disorders or history of blood clots -cancer patient not on chemotherapy -cystic fibrosis -heart disease, such as angina, heart failure, or a history of a heart attack -hemoglobin level of 12 g/dL or greater -high blood pressure -low levels of folate, iron, or vitamin B12 -seizures -an unusual or allergic reaction to darbepoetin, erythropoietin, albumin, hamster proteins, latex, other medicines, foods, dyes, or preservatives -pregnant or trying to get pregnant -breast-feeding How should I use this medicine? This medicine is for injection into a vein or under the skin. It is usually given by a health care professional in a hospital or clinic setting. If you get this medicine at home, you will be taught how to prepare and give this medicine. Do not shake the solution before you withdraw a dose. Use exactly as directed. Take your medicine at regular intervals. Do not take your medicine more often than directed. It is important that you put your used needles and syringes in a special sharps container. Do not put them in a trash can. If you do not have a sharps container, call your pharmacist or healthcare provider to get one. Talk to your pediatrician regarding the use of this medicine in children. While this medicine may be used in children as young as 1 year for selected conditions, precautions do apply. Overdosage: If you think you have taken too much of this medicine contact a poison control center or emergency room at once. NOTE:  This medicine is only for you. Do not share this medicine with others. What if I miss a dose? If you miss a dose, take it as soon as you can. If it is almost time for your next dose, take only that dose. Do not take double or extra doses. What may interact with this medicine? Do not take this medicine with any of the following medications: -epoetin alfa This list may not describe all possible interactions. Give your health care provider a list of all the medicines, herbs, non-prescription drugs, or dietary supplements you use. Also tell them if you smoke, drink alcohol, or use illegal drugs. Some items may interact with your medicine. What should I watch for while using this medicine? Visit your prescriber or health care professional for regular checks on your progress and for the needed blood tests and blood pressure measurements. It is especially important for the doctor to make sure your hemoglobin level is in the desired range, to limit the risk of potential side effects and to give you the best benefit. Keep all appointments for any recommended tests. Check your blood pressure as directed. Ask your doctor what your blood pressure should be and when you should contact him or her. As your body makes more red blood cells, you may need to take iron, folic acid, or vitamin B supplements. Ask your doctor or health care provider which products are right for you. If you have kidney disease continue dietary restrictions, even though this medication can make you feel better. Talk with your doctor or health care professional about the   foods you eat and the vitamins that you take. What side effects may I notice from receiving this medicine? Side effects that you should report to your doctor or health care professional as soon as possible: -allergic reactions like skin rash, itching or hives, swelling of the face, lips, or tongue -breathing problems -changes in vision -chest pain -confusion, trouble speaking  or understanding -feeling faint or lightheaded, falls -high blood pressure -muscle aches or pains -pain, swelling, warmth in the leg -rapid weight gain -severe headaches -sudden numbness or weakness of the face, arm or leg -trouble walking, dizziness, loss of balance or coordination -seizures (convulsions) -swelling of the ankles, feet, hands -unusually weak or tired Side effects that usually do not require medical attention (report to your doctor or health care professional if they continue or are bothersome): -diarrhea -fever, chills (flu-like symptoms) -headaches -nausea, vomiting -redness, stinging, or swelling at site where injected This list may not describe all possible side effects. Call your doctor for medical advice about side effects. You may report side effects to FDA at 1-800-FDA-1088. Where should I keep my medicine? Keep out of the reach of children. Store in a refrigerator between 2 and 8 degrees C (36 and 46 degrees F). Do not freeze. Do not shake. Throw away any unused portion if using a single-dose vial. Throw away any unused medicine after the expiration date. NOTE: This sheet is a summary. It may not cover all possible information. If you have questions about this medicine, talk to your doctor, pharmacist, or health care provider.    2016, Elsevier/Gold Standard. (2008-09-23 10:23:57)  

## 2016-02-04 ENCOUNTER — Ambulatory Visit (HOSPITAL_BASED_OUTPATIENT_CLINIC_OR_DEPARTMENT_OTHER)
Admission: RE | Admit: 2016-02-04 | Discharge: 2016-02-04 | Disposition: A | Discharge status: Home / Self Care | Payer: Medicaid Other | Source: Ambulatory Visit | Attending: Internal Medicine | Admitting: Internal Medicine

## 2016-02-04 DIAGNOSIS — Z1231 Encounter for screening mammogram for malignant neoplasm of breast: Secondary | ICD-10-CM | POA: Diagnosis not present

## 2016-04-05 ENCOUNTER — Ambulatory Visit (HOSPITAL_BASED_OUTPATIENT_CLINIC_OR_DEPARTMENT_OTHER): Payer: Medicaid Other

## 2016-04-05 ENCOUNTER — Encounter: Payer: Self-pay | Admitting: Family

## 2016-04-05 ENCOUNTER — Other Ambulatory Visit (HOSPITAL_BASED_OUTPATIENT_CLINIC_OR_DEPARTMENT_OTHER): Payer: Medicaid Other

## 2016-04-05 ENCOUNTER — Ambulatory Visit (HOSPITAL_BASED_OUTPATIENT_CLINIC_OR_DEPARTMENT_OTHER): Payer: Medicaid Other | Admitting: Family

## 2016-04-05 VITALS — BP 145/81 | HR 87 | Temp 98.8°F | Resp 16 | Ht 60.0 in | Wt 157.0 lb

## 2016-04-05 DIAGNOSIS — D631 Anemia in chronic kidney disease: Secondary | ICD-10-CM

## 2016-04-05 DIAGNOSIS — N189 Chronic kidney disease, unspecified: Secondary | ICD-10-CM

## 2016-04-05 DIAGNOSIS — D61818 Other pancytopenia: Secondary | ICD-10-CM

## 2016-04-05 DIAGNOSIS — K754 Autoimmune hepatitis: Secondary | ICD-10-CM

## 2016-04-05 LAB — CBC WITH DIFFERENTIAL (CANCER CENTER ONLY)
BASO#: 0 10*3/uL (ref 0.0–0.2)
BASO%: 0 % (ref 0.0–2.0)
EOS%: 2.3 % (ref 0.0–7.0)
Eosinophils Absolute: 0.1 10*3/uL (ref 0.0–0.5)
HCT: 27 % — ABNORMAL LOW (ref 34.8–46.6)
HGB: 8.9 g/dL — ABNORMAL LOW (ref 11.6–15.9)
LYMPH#: 0.7 10*3/uL — ABNORMAL LOW (ref 0.9–3.3)
LYMPH%: 29.7 % (ref 14.0–48.0)
MCH: 31.3 pg (ref 26.0–34.0)
MCHC: 33 g/dL (ref 32.0–36.0)
MCV: 95 fL (ref 81–101)
MONO#: 0.2 10*3/uL (ref 0.1–0.9)
MONO%: 10.5 % (ref 0.0–13.0)
NEUT#: 1.3 10*3/uL — ABNORMAL LOW (ref 1.5–6.5)
NEUT%: 57.5 % (ref 39.6–80.0)
PLATELETS: 129 10*3/uL — AB (ref 145–400)
RBC: 2.84 10*6/uL — AB (ref 3.70–5.32)
RDW: 14 % (ref 11.1–15.7)
WBC: 2.2 10*3/uL — AB (ref 3.9–10.0)

## 2016-04-05 LAB — FERRITIN: FERRITIN: 727 ng/mL — AB (ref 9–269)

## 2016-04-05 LAB — COMPREHENSIVE METABOLIC PANEL
ALT: 9 U/L (ref 0–55)
ANION GAP: 7 meq/L (ref 3–11)
AST: 15 U/L (ref 5–34)
Albumin: 3.6 g/dL (ref 3.5–5.0)
Alkaline Phosphatase: 100 U/L (ref 40–150)
BUN: 26.7 mg/dL — ABNORMAL HIGH (ref 7.0–26.0)
CHLORIDE: 108 meq/L (ref 98–109)
CO2: 21 meq/L — AB (ref 22–29)
Calcium: 8.2 mg/dL — ABNORMAL LOW (ref 8.4–10.4)
Creatinine: 2 mg/dL — ABNORMAL HIGH (ref 0.6–1.1)
EGFR: 31 mL/min/{1.73_m2} — AB (ref >90)
GLUCOSE: 108 mg/dL (ref 70–140)
Potassium: 5.1 mEq/L (ref 3.5–5.1)
SODIUM: 137 meq/L (ref 136–145)
Total Bilirubin: 0.54 mg/dL (ref 0.20–1.20)
Total Protein: 8.5 g/dL — ABNORMAL HIGH (ref 6.4–8.3)

## 2016-04-05 LAB — CHCC SATELLITE - SMEAR

## 2016-04-05 LAB — IRON AND TIBC
%SAT: 55 % (ref 21–57)
IRON: 100 ug/dL (ref 41–142)
TIBC: 181 ug/dL — ABNORMAL LOW (ref 236–444)
UIBC: 81 ug/dL — AB (ref 120–384)

## 2016-04-05 MED ORDER — DARBEPOETIN ALFA 300 MCG/0.6ML IJ SOSY
PREFILLED_SYRINGE | INTRAMUSCULAR | Status: AC
  Filled 2016-04-05: qty 0.6

## 2016-04-05 MED ORDER — DARBEPOETIN ALFA 300 MCG/0.6ML IJ SOSY
300.0000 ug | PREFILLED_SYRINGE | Freq: Once | INTRAMUSCULAR | Status: AC
  Administered 2016-04-05: 300 ug via SUBCUTANEOUS

## 2016-04-05 NOTE — Progress Notes (Signed)
Hematology and Oncology Follow Up Visit  Tami [name] [id number] 1955/12/17 60 y.o. 04/05/2016  Principle Diagnosis:  Chronic pancytopenia.  Status post liver transplant for autoimmune hepatitis.  Anemia of chronic kidney disease  Current Therapy:   Aranesp 300 mcg subcu as needed for hemoglobin less than 10.    Interim History: Tami [name] is here today for a follow-up. She is getting over a bout with bronchitis and feeling much better. She finished her z-pack and her cough and SOB have resolved. She is still having some mild fatigue at times.  No fever, chills, n/v, rash, headache, dizziness, chest pain, palpitations, abdominal pain or changes in bowel or bladder habits.  Her Hgb is 8.9 with an MCV of 95. No episodes of bleeding or bruising.  She continues to follow-up with transplant team annually at [facility].  No swelling, tenderness, numbness or tingling in her extremities at this time. She has chronic lower back and knee pain that comes and goes. No lymphadenopathy found on exam. She has a good appetite and is staying well-hydrated. Her weight is stable.  Medications:    Medication List       This list is accurate as of: 04/05/16 11:18 AM.  Always use your most recent med list.               alendronate 70 MG tablet  Commonly known as:  FOSAMAX  Take 70 mg by mouth once a week.     allopurinol 100 MG tablet  Commonly known as:  ZYLOPRIM  Take 100 mg by mouth daily.     amLODipine 5 MG tablet  Commonly known as:  NORVASC  Take 5 mg by mouth daily.     calcitonin (salmon) 200 UNIT/ACT nasal spray  Commonly known as:  MIACALCIN/FORTICAL  Place 1 spray into alternate nostrils daily.     CALCIUM 1200 PO  Take by mouth 2 (two) times daily.     gabapentin 300 MG capsule  Commonly known as:  NEURONTIN  Take 1 capsule (300 mg total) by mouth 2 (two) times daily.     hydrochlorothiazide 12.5 MG tablet  Commonly known as:  HYDRODIURIL  Take 2 tablets (25 mg total)  by mouth daily.     metoprolol succinate 50 MG 24 hr tablet  Commonly known as:  TOPROL-XL  Take 50 mg by mouth daily.     multivitamin with minerals tablet  Take 1 tablet by mouth daily.     NEORAL 25 MG capsule  Generic drug:  cycloSPORINE modified  Take 25 mg by mouth 2 (two) times daily. Takes 3 tabs in the morning and 2 tabs at bedtime.     ondansetron 4 MG disintegrating tablet  Commonly known as:  ZOFRAN-ODT  Take 4 mg by mouth every 8 (eight) hours as needed for nausea or vomiting.     oxyCODONE 15 MG immediate release tablet  Commonly known as:  ROXICODONE  Take 15 mg by mouth every 6 (six) hours as needed.     promethazine 12.5 MG tablet  Commonly known as:  PHENERGAN  Take 1 tablet (12.5 mg total) by mouth every 6 (six) hours as needed.     Vitamin C 250 MG Chew  Chew by mouth every morning.     Vitamin D (Ergocalciferol) 50000 units Caps capsule  Commonly known as:  DRISDOL  Take 50,000 Units by mouth every 14 (fourteen) days.     VOLTAREN 1 % Gel  Generic drug:  diclofenac sodium  Note:Apply 4 grams to painful knees, up to 4 times daily, as needed     zolpidem 5 MG tablet  Commonly known as:  AMBIEN  Take 1 tablet (5 mg total) by mouth at bedtime as needed for sleep.       Allergies:  Allergies  Allergen Reactions  . Iodinated Diagnostic Agents     Past Medical History, Surgical history, Social history, and Family History were reviewed and updated.  Review of Systems: All other 10 point review of systems is negative.   Physical Exam:  vitals were not taken for this visit.  Wt Readings from Last 3 Encounters:  02/02/16 155 lb (70.308 kg)  12/01/15 151 lb (68.493 kg)  09/29/15 149 lb (67.586 kg)    Ocular: Sclerae unicteric, pupils equal, round and reactive to light Ear-nose-throat: Oropharynx clear, dentition fair Lymphatic: No cervical supraclavicular or axillary adenopathy Lungs no rales or rhonchi, good excursion bilaterally Heart  regular rate and rhythm, no murmur appreciated Abd soft, nontender, positive bowel sounds, no liver or spleen tip palpated on exam, no fluid wave MSK no focal spinal tenderness, no joint edema Neuro: non-focal, well-oriented, appropriate affect Breasts: Deferred  Lab Results  Component Value Date   WBC 2.2* 04/05/2016   HGB 8.9* 04/05/2016   HCT 27.0* 04/05/2016   MCV 95 04/05/2016   PLT 129* 04/05/2016   Lab Results  Component Value Date   FERRITIN 651* 02/02/2016   IRON 45 02/02/2016   TIBC 163* 02/02/2016   UIBC 117* 02/02/2016   IRONPCTSAT 28 02/02/2016   Lab Results  Component Value Date   RETICCTPCT 1.2 09/29/2015   RBC 2.84* 04/05/2016   RETICCTABS 38.0 09/29/2015   No results found for: KPAFRELGTCHN, LAMBDASER, KAPLAMBRATIO No results found for: Osborne Casco Lab Results  Component Value Date   TOTALPROTELP 9.4* 11/19/2008   ALBUMINELP 41.1* 11/19/2008   A1GS 4.0 11/19/2008   A2GS 7.1 11/19/2008   BETS 4.0* 11/19/2008   BETA2SER 7.1* 11/19/2008   GAMS 36.7* 11/19/2008   MSPIKE NOT DET 11/19/2008   SPEI * 11/19/2008     Chemistry      Component Value Date/Time   NA 137 02/02/2016 1105   NA 134 12/02/2015 1150   NA 136 10/03/2014 1512   K 4.8 02/02/2016 1105   K 4.7 12/02/2015 1150   K 4.7 10/03/2014 1512   CL 107 12/02/2015 1150   CL 105 10/03/2014 1512   CO2 21* 02/02/2016 1105   CO2 23 12/02/2015 1150   CO2 20 10/03/2014 1512   BUN 23.1 02/02/2016 1105   BUN 25* 12/02/2015 1150   BUN 35* 10/03/2014 1512   CREATININE 1.9* 02/02/2016 1105   CREATININE 1.7* 12/02/2015 1150   CREATININE 2.08* 10/03/2014 1512      Component Value Date/Time   CALCIUM 8.5 02/02/2016 1105   CALCIUM 8.4 12/02/2015 1150   CALCIUM 8.6 10/03/2014 1512   ALKPHOS 89 02/02/2016 1105   ALKPHOS 102* 12/02/2015 1150   ALKPHOS 73 10/03/2014 1512   AST 15 02/02/2016 1105   AST 24 12/02/2015 1150   AST 20 10/03/2014 1512   ALT <9 02/02/2016 1105   ALT 13  12/02/2015 1150   ALT 12 10/03/2014 1512   BILITOT 0.38 02/02/2016 1105   BILITOT 0.50 12/02/2015 1150   BILITOT 0.6 10/03/2014 1512     Impression and Plan: Tami [name] is 60 yo African American female with anemia of chronic kidney disease. Her Hgb is 8.9 today so she will  receive Aranesp.  She also has a history of liver transplant secondary to autoimmune hepatitis. So far, there has been no evidence of rejection. She continues to follow up with her transplant team at [facility] annually.   We will plan to see her back in 6 weeks for repeat lab work and follow-up. She knows to call here with any questions or concerns. We can certainly see her back sooner if need be.   Eliezer Bottom, NP  6/13/201711:18 AM

## 2016-04-05 NOTE — Patient Instructions (Signed)
Darbepoetin Alfa injection What is this medicine? DARBEPOETIN ALFA (dar be POE e tin AL fa) helps your body make more red blood cells. It is used to treat anemia caused by chronic kidney failure and chemotherapy. This medicine may be used for other purposes; ask your health care provider or pharmacist if you have questions. What should I tell my health care provider before I take this medicine? They need to know if you have any of these conditions: -blood clotting disorders or history of blood clots -cancer patient not on chemotherapy -cystic fibrosis -heart disease, such as angina, heart failure, or a history of a heart attack -hemoglobin level of 12 g/dL or greater -high blood pressure -low levels of folate, iron, or vitamin B12 -seizures -an unusual or allergic reaction to darbepoetin, erythropoietin, albumin, hamster proteins, latex, other medicines, foods, dyes, or preservatives -pregnant or trying to get pregnant -breast-feeding How should I use this medicine? This medicine is for injection into a vein or under the skin. It is usually given by a health care professional in a hospital or clinic setting. If you get this medicine at home, you will be taught how to prepare and give this medicine. Do not shake the solution before you withdraw a dose. Use exactly as directed. Take your medicine at regular intervals. Do not take your medicine more often than directed. It is important that you put your used needles and syringes in a special sharps container. Do not put them in a trash can. If you do not have a sharps container, call your pharmacist or healthcare provider to get one. Talk to your pediatrician regarding the use of this medicine in children. While this medicine may be used in children as young as 1 year for selected conditions, precautions do apply. Overdosage: If you think you have taken too much of this medicine contact a poison control center or emergency room at once. NOTE:  This medicine is only for you. Do not share this medicine with others. What if I miss a dose? If you miss a dose, take it as soon as you can. If it is almost time for your next dose, take only that dose. Do not take double or extra doses. What may interact with this medicine? Do not take this medicine with any of the following medications: -epoetin alfa This list may not describe all possible interactions. Give your health care provider a list of all the medicines, herbs, non-prescription drugs, or dietary supplements you use. Also tell them if you smoke, drink alcohol, or use illegal drugs. Some items may interact with your medicine. What should I watch for while using this medicine? Visit your prescriber or health care professional for regular checks on your progress and for the needed blood tests and blood pressure measurements. It is especially important for the doctor to make sure your hemoglobin level is in the desired range, to limit the risk of potential side effects and to give you the best benefit. Keep all appointments for any recommended tests. Check your blood pressure as directed. Ask your doctor what your blood pressure should be and when you should contact him or her. As your body makes more red blood cells, you may need to take iron, folic acid, or vitamin B supplements. Ask your doctor or health care provider which products are right for you. If you have kidney disease continue dietary restrictions, even though this medication can make you feel better. Talk with your doctor or health care professional about the   foods you eat and the vitamins that you take. What side effects may I notice from receiving this medicine? Side effects that you should report to your doctor or health care professional as soon as possible: -allergic reactions like skin rash, itching or hives, swelling of the face, lips, or tongue -breathing problems -changes in vision -chest pain -confusion, trouble speaking  or understanding -feeling faint or lightheaded, falls -high blood pressure -muscle aches or pains -pain, swelling, warmth in the leg -rapid weight gain -severe headaches -sudden numbness or weakness of the face, arm or leg -trouble walking, dizziness, loss of balance or coordination -seizures (convulsions) -swelling of the ankles, feet, hands -unusually weak or tired Side effects that usually do not require medical attention (report to your doctor or health care professional if they continue or are bothersome): -diarrhea -fever, chills (flu-like symptoms) -headaches -nausea, vomiting -redness, stinging, or swelling at site where injected This list may not describe all possible side effects. Call your doctor for medical advice about side effects. You may report side effects to FDA at 1-800-FDA-1088. Where should I keep my medicine? Keep out of the reach of children. Store in a refrigerator between 2 and 8 degrees C (36 and 46 degrees F). Do not freeze. Do not shake. Throw away any unused portion if using a single-dose vial. Throw away any unused medicine after the expiration date. NOTE: This sheet is a summary. It may not cover all possible information. If you have questions about this medicine, talk to your doctor, pharmacist, or health care provider.    2016, Elsevier/Gold Standard. (2008-09-23 10:23:57)  

## 2016-04-06 LAB — RETICULOCYTES: Reticulocyte Count: 1.6 % (ref 0.6–2.6)

## 2016-05-19 ENCOUNTER — Ambulatory Visit (HOSPITAL_BASED_OUTPATIENT_CLINIC_OR_DEPARTMENT_OTHER): Payer: Medicaid Other

## 2016-05-19 ENCOUNTER — Ambulatory Visit (HOSPITAL_BASED_OUTPATIENT_CLINIC_OR_DEPARTMENT_OTHER): Payer: Medicaid Other | Admitting: Hematology & Oncology

## 2016-05-19 ENCOUNTER — Other Ambulatory Visit (HOSPITAL_BASED_OUTPATIENT_CLINIC_OR_DEPARTMENT_OTHER): Payer: Medicaid Other

## 2016-05-19 VITALS — BP 141/75 | HR 71 | Temp 98.1°F | Resp 20 | Wt 152.0 lb

## 2016-05-19 DIAGNOSIS — N189 Chronic kidney disease, unspecified: Secondary | ICD-10-CM

## 2016-05-19 DIAGNOSIS — D631 Anemia in chronic kidney disease: Secondary | ICD-10-CM | POA: Diagnosis not present

## 2016-05-19 DIAGNOSIS — Z944 Liver transplant status: Secondary | ICD-10-CM | POA: Diagnosis not present

## 2016-05-19 DIAGNOSIS — D61818 Other pancytopenia: Secondary | ICD-10-CM

## 2016-05-19 DIAGNOSIS — D619 Aplastic anemia, unspecified: Secondary | ICD-10-CM

## 2016-05-19 DIAGNOSIS — K754 Autoimmune hepatitis: Secondary | ICD-10-CM | POA: Diagnosis not present

## 2016-05-19 LAB — IRON AND TIBC
%SAT: 62 % — ABNORMAL HIGH (ref 21–57)
Iron: 112 ug/dL (ref 41–142)
TIBC: 179 ug/dL — AB (ref 236–444)
UIBC: 67 ug/dL — ABNORMAL LOW (ref 120–384)

## 2016-05-19 LAB — FERRITIN: Ferritin: 579 ng/ml — ABNORMAL HIGH (ref 9–269)

## 2016-05-19 LAB — COMPREHENSIVE METABOLIC PANEL
ALBUMIN: 3.7 g/dL (ref 3.5–5.0)
ALK PHOS: 113 U/L (ref 40–150)
ALT: <9 U/L (ref 0–55)
AST: 15 U/L (ref 5–34)
Anion Gap: 9 mEq/L (ref 3–11)
BILIRUBIN TOTAL: 0.45 mg/dL (ref 0.20–1.20)
BUN: 36.9 mg/dL — AB (ref 7.0–26.0)
CALCIUM: 8.3 mg/dL — AB (ref 8.4–10.4)
CO2: 21 mEq/L — ABNORMAL LOW (ref 22–29)
Chloride: 107 mEq/L (ref 98–109)
Creatinine: 2.1 mg/dL — ABNORMAL HIGH (ref 0.6–1.1)
EGFR: 29 mL/min/{1.73_m2} — AB (ref >90)
GLUCOSE: 104 mg/dL (ref 70–140)
Potassium: 4.7 mEq/L (ref 3.5–5.1)
SODIUM: 136 meq/L (ref 136–145)
Total Protein: 8.7 g/dL — ABNORMAL HIGH (ref 6.4–8.3)

## 2016-05-19 LAB — CBC WITH DIFFERENTIAL (CANCER CENTER ONLY)
BASO#: 0 10*3/uL (ref 0.0–0.2)
BASO%: 0.3 % (ref 0.0–2.0)
EOS%: 2.9 % (ref 0.0–7.0)
Eosinophils Absolute: 0.1 10*3/uL (ref 0.0–0.5)
HCT: 29.8 % — ABNORMAL LOW (ref 34.8–46.6)
HGB: 9.9 g/dL — ABNORMAL LOW (ref 11.6–15.9)
LYMPH#: 0.7 10*3/uL — ABNORMAL LOW (ref 0.9–3.3)
LYMPH%: 22.4 % (ref 14.0–48.0)
MCH: 31.1 pg (ref 26.0–34.0)
MCHC: 33.2 g/dL (ref 32.0–36.0)
MCV: 94 fL (ref 81–101)
MONO#: 0.3 10*3/uL (ref 0.1–0.9)
MONO%: 9.9 % (ref 0.0–13.0)
NEUT#: 2 10*3/uL (ref 1.5–6.5)
NEUT%: 64.5 % (ref 39.6–80.0)
PLATELETS: 100 10*3/uL — AB (ref 145–400)
RBC: 3.18 10*6/uL — AB (ref 3.70–5.32)
RDW: 12.7 % (ref 11.1–15.7)
WBC: 3.1 10*3/uL — AB (ref 3.9–10.0)

## 2016-05-19 LAB — CHCC SATELLITE - SMEAR

## 2016-05-19 MED ORDER — DARBEPOETIN ALFA 300 MCG/0.6ML IJ SOSY
300.0000 ug | PREFILLED_SYRINGE | Freq: Once | INTRAMUSCULAR | Status: AC
  Administered 2016-05-19: 300 ug via SUBCUTANEOUS

## 2016-05-19 MED ORDER — DARBEPOETIN ALFA 300 MCG/0.6ML IJ SOSY
PREFILLED_SYRINGE | INTRAMUSCULAR | Status: AC
  Filled 2016-05-19: qty 0.6

## 2016-05-19 NOTE — Patient Instructions (Signed)
Darbepoetin Alfa injection What is this medicine? DARBEPOETIN ALFA (dar be POE e tin AL fa) helps your body make more red blood cells. It is used to treat anemia caused by chronic kidney failure and chemotherapy. This medicine may be used for other purposes; ask your health care provider or pharmacist if you have questions. What should I tell my health care provider before I take this medicine? They need to know if you have any of these conditions: -blood clotting disorders or history of blood clots -cancer patient not on chemotherapy -cystic fibrosis -heart disease, such as angina, heart failure, or a history of a heart attack -hemoglobin level of 12 g/dL or greater -high blood pressure -low levels of folate, iron, or vitamin B12 -seizures -an unusual or allergic reaction to darbepoetin, erythropoietin, albumin, hamster proteins, latex, other medicines, foods, dyes, or preservatives -pregnant or trying to get pregnant -breast-feeding How should I use this medicine? This medicine is for injection into a vein or under the skin. It is usually given by a health care professional in a hospital or clinic setting. If you get this medicine at home, you will be taught how to prepare and give this medicine. Do not shake the solution before you withdraw a dose. Use exactly as directed. Take your medicine at regular intervals. Do not take your medicine more often than directed. It is important that you put your used needles and syringes in a special sharps container. Do not put them in a trash can. If you do not have a sharps container, call your pharmacist or healthcare provider to get one. Talk to your pediatrician regarding the use of this medicine in children. While this medicine may be used in children as young as 1 year for selected conditions, precautions do apply. Overdosage: If you think you have taken too much of this medicine contact a poison control center or emergency room at once. NOTE:  This medicine is only for you. Do not share this medicine with others. What if I miss a dose? If you miss a dose, take it as soon as you can. If it is almost time for your next dose, take only that dose. Do not take double or extra doses. What may interact with this medicine? Do not take this medicine with any of the following medications: -epoetin alfa This list may not describe all possible interactions. Give your health care provider a list of all the medicines, herbs, non-prescription drugs, or dietary supplements you use. Also tell them if you smoke, drink alcohol, or use illegal drugs. Some items may interact with your medicine. What should I watch for while using this medicine? Visit your prescriber or health care professional for regular checks on your progress and for the needed blood tests and blood pressure measurements. It is especially important for the doctor to make sure your hemoglobin level is in the desired range, to limit the risk of potential side effects and to give you the best benefit. Keep all appointments for any recommended tests. Check your blood pressure as directed. Ask your doctor what your blood pressure should be and when you should contact him or her. As your body makes more red blood cells, you may need to take iron, folic acid, or vitamin B supplements. Ask your doctor or health care provider which products are right for you. If you have kidney disease continue dietary restrictions, even though this medication can make you feel better. Talk with your doctor or health care professional about the   foods you eat and the vitamins that you take. What side effects may I notice from receiving this medicine? Side effects that you should report to your doctor or health care professional as soon as possible: -allergic reactions like skin rash, itching or hives, swelling of the face, lips, or tongue -breathing problems -changes in vision -chest pain -confusion, trouble speaking  or understanding -feeling faint or lightheaded, falls -high blood pressure -muscle aches or pains -pain, swelling, warmth in the leg -rapid weight gain -severe headaches -sudden numbness or weakness of the face, arm or leg -trouble walking, dizziness, loss of balance or coordination -seizures (convulsions) -swelling of the ankles, feet, hands -unusually weak or tired Side effects that usually do not require medical attention (report to your doctor or health care professional if they continue or are bothersome): -diarrhea -fever, chills (flu-like symptoms) -headaches -nausea, vomiting -redness, stinging, or swelling at site where injected This list may not describe all possible side effects. Call your doctor for medical advice about side effects. You may report side effects to FDA at 1-800-FDA-1088. Where should I keep my medicine? Keep out of the reach of children. Store in a refrigerator between 2 and 8 degrees C (36 and 46 degrees F). Do not freeze. Do not shake. Throw away any unused portion if using a single-dose vial. Throw away any unused medicine after the expiration date. NOTE: This sheet is a summary. It may not cover all possible information. If you have questions about this medicine, talk to your doctor, pharmacist, or health care provider.    2016, Elsevier/Gold Standard. (2008-09-23 10:23:57)  

## 2016-05-19 NOTE — Progress Notes (Signed)
Hematology and Oncology Follow Up Visit  Tami [name] [id number] December 07, 1955 60 y.o. 05/19/2016   Principle Diagnosis:  1. Chronic pancytopenia. 2. Status post liver transplant for autoimmune hepatitis. 3. Renal insufficiency with anemia.  Current Therapy:   Aranesp 300 mcg subcu as needed for hemoglobin less than 10.     Interim History:  Tami [name] is back for followup. She is doing okay. She feels a tired possibly. Thank you, her daughter is coming home for a week or so from [place]. This is making her happy.   She has had no problems with pain. She is worried about some of the extra fatty tissue that she has from her past surgeries. I told her that I wouldn't think a plastic surgeon would get involved given her history and blood counts.   She saw her pain physician back in early June.  She is had no fever. She has had no cough. There's been no change in bowel or bladder habits. the cyclosporine. Again, the transplant doctors can help manage this.  Overall, her performance status is ECOG 1.   Medications:  Current Outpatient Prescriptions:  .  albuterol (PROVENTIL HFA;VENTOLIN HFA) 108 (90 Base) MCG/ACT inhaler, Inhale into the lungs., Disp: , Rfl:  .  alendronate (FOSAMAX) 70 MG tablet, Take 70 mg by mouth once a week., Disp: , Rfl: 1 .  allopurinol (ZYLOPRIM) 100 MG tablet, Take 100 mg by mouth daily., Disp: , Rfl: 5 .  amLODipine (NORVASC) 5 MG tablet, Take 5 mg by mouth daily., Disp: , Rfl: 5 .  Ascorbic Acid (VITAMIN C) 250 MG CHEW, Chew by mouth every morning., Disp: , Rfl:  .  calcitonin, salmon, (MIACALCIN/FORTICAL) 200 UNIT/ACT nasal spray, Place 1 spray into alternate nostrils daily., Disp: , Rfl:  .  Calcium Carbonate-Vit D-Min (CALCIUM 1200 PO), Take by mouth 2 (two) times daily., Disp: , Rfl:  .  cycloSPORINE modified (NEORAL) 25 MG capsule, Take 25 mg by mouth 2 (two) times daily. Takes 3 tabs in the morning and 2 tabs at bedtime., Disp: , Rfl:  .  diclofenac  sodium (VOLTAREN) 1 % GEL, Note:Apply 4 grams to painful knees, up to 4 times daily, as needed, Disp: , Rfl:  .  gabapentin (NEURONTIN) 300 MG capsule, Take 1 capsule (300 mg total) by mouth 2 (two) times daily., Disp: 60 capsule, Rfl: 5 .  hydrochlorothiazide (HYDRODIURIL) 12.5 MG tablet, Take 2 tablets (25 mg total) by mouth daily., Disp: 60 tablet, Rfl: 3 .  metoprolol succinate (TOPROL-XL) 50 MG 24 hr tablet, Take 50 mg by mouth daily., Disp: , Rfl: 5 .  Multiple Vitamins-Minerals (MULTIVITAMIN WITH MINERALS) tablet, Take 1 tablet by mouth daily.  , Disp: , Rfl:  .  ondansetron (ZOFRAN-ODT) 4 MG disintegrating tablet, Take 4 mg by mouth every 8 (eight) hours as needed for nausea or vomiting., Disp: , Rfl:  .  oxyCODONE (ROXICODONE) 15 MG immediate release tablet, Take 15 mg by mouth every 6 (six) hours as needed., Disp: , Rfl:  .  promethazine (PHENERGAN) 12.5 MG tablet, Take 1 tablet (12.5 mg total) by mouth every 6 (six) hours as needed., Disp: 90 tablet, Rfl: 3 .  Vitamin D, Ergocalciferol, (DRISDOL) 50000 units CAPS capsule, Take 50,000 Units by mouth every 14 (fourteen) days., Disp: , Rfl: 3 .  zolpidem (AMBIEN) 5 MG tablet, Take 1 tablet (5 mg total) by mouth at bedtime as needed for sleep., Disp: 30 tablet, Rfl: 2  Allergies:  Allergies  Allergen Reactions  .  Iodinated Diagnostic Agents     Past Medical History, Surgical history, Social history, and Family History were reviewed and updated.  Review of Systems: As above  Physical Exam:  weight is 152 lb (68.9 kg). Her oral temperature is 98.1 F (36.7 C). Her blood pressure is 141/75 (abnormal) and her pulse is 71. Her respiration is 20.   Tami [name]. Her head and neck exam shows moderate erythema in the pharynx. I do not see any exudate. Neck is with some slight adenopathy in the upper cervical nodes. Lungs are clear. Cardiac exam regular rate and rhythm. There are no murmurs, rubs or bruits. Abdomen is soft.  Has good bowel sounds. She does have a well-healed laparotomy scar. She has an abdominal wall hernia. There is no fluid wave. No palpable liver or spleen tip. Extremities shows no clubbing cyanosis or edema. Skin exam shows no rashes. Neurological exam no focal neurological deficits.  Lab Results  Component Value Date   WBC 3.1 (L) 05/19/2016   HGB 9.9 (L) 05/19/2016   HCT 29.8 (L) 05/19/2016   MCV 94 05/19/2016   PLT 100 (L) 05/19/2016     Chemistry      Component Value Date/Time   NA 136 05/19/2016 0933   K 4.7 05/19/2016 0933   CL 107 12/02/2015 1150   CO2 21 (L) 05/19/2016 0933   BUN 36.9 (H) 05/19/2016 0933   CREATININE 2.1 (H) 05/19/2016 0933      Component Value Date/Time   CALCIUM 8.3 (L) 05/19/2016 0933   ALKPHOS 113 05/19/2016 0933   AST 15 05/19/2016 0933   ALT <9 05/19/2016 0933   BILITOT 0.45 05/19/2016 0933         Impression and Plan: Tami [name] is 60 year old African [name]. She has a history of liver transplant secondary to autoimmune hepatitis. She's done very well with this. Hopefully liver rejection will not be a problem.  For now, we will go ahead and give her a dose of Aranesp. This usually works quite well for her. She is chronic renal insufficiency. This I think will always be a problem for her as long she is on cyclosporine.  Her iron studies are a little on the high side. Her ferritin is 579. Her iron saturation is 62%.   I will have her come back to see [name] in about 5-6 weeks.Tami Napoleon, MD 7/27/20175:12 PM

## 2016-05-20 LAB — RETICULOCYTES: Reticulocyte Count: 0.6 % (ref 0.6–2.6)

## 2016-07-07 ENCOUNTER — Encounter: Payer: Self-pay | Admitting: Hematology & Oncology

## 2016-07-07 ENCOUNTER — Ambulatory Visit (HOSPITAL_BASED_OUTPATIENT_CLINIC_OR_DEPARTMENT_OTHER): Payer: Medicaid Other | Admitting: Hematology & Oncology

## 2016-07-07 ENCOUNTER — Encounter: Payer: Self-pay | Admitting: Oncology

## 2016-07-07 ENCOUNTER — Other Ambulatory Visit (HOSPITAL_BASED_OUTPATIENT_CLINIC_OR_DEPARTMENT_OTHER): Payer: Medicaid Other

## 2016-07-07 ENCOUNTER — Ambulatory Visit: Payer: Medicaid Other

## 2016-07-07 VITALS — BP 149/81 | HR 68 | Temp 98.1°F | Resp 16 | Ht 60.0 in | Wt 150.0 lb

## 2016-07-07 DIAGNOSIS — K754 Autoimmune hepatitis: Secondary | ICD-10-CM

## 2016-07-07 DIAGNOSIS — D61818 Other pancytopenia: Secondary | ICD-10-CM | POA: Diagnosis not present

## 2016-07-07 DIAGNOSIS — N189 Chronic kidney disease, unspecified: Secondary | ICD-10-CM

## 2016-07-07 DIAGNOSIS — D631 Anemia in chronic kidney disease: Secondary | ICD-10-CM | POA: Diagnosis not present

## 2016-07-07 DIAGNOSIS — G4701 Insomnia due to medical condition: Secondary | ICD-10-CM

## 2016-07-07 DIAGNOSIS — D619 Aplastic anemia, unspecified: Secondary | ICD-10-CM

## 2016-07-07 LAB — COMPREHENSIVE METABOLIC PANEL
ALBUMIN: 3.5 g/dL (ref 3.5–5.0)
ALK PHOS: 104 U/L (ref 40–150)
ALT: 10 U/L (ref 0–55)
ANION GAP: 9 meq/L (ref 3–11)
AST: 16 U/L (ref 5–34)
BUN: 22 mg/dL (ref 7.0–26.0)
CALCIUM: 9 mg/dL (ref 8.4–10.4)
CO2: 19 mEq/L — ABNORMAL LOW (ref 22–29)
Chloride: 110 mEq/L — ABNORMAL HIGH (ref 98–109)
Creatinine: 1.5 mg/dL — ABNORMAL HIGH (ref 0.6–1.1)
EGFR: 43 mL/min/{1.73_m2} — AB (ref >90)
Glucose: 110 mg/dl (ref 70–140)
POTASSIUM: 5 meq/L (ref 3.5–5.1)
Sodium: 138 mEq/L (ref 136–145)
Total Bilirubin: 0.31 mg/dL (ref 0.20–1.20)
Total Protein: 9.1 g/dL — ABNORMAL HIGH (ref 6.4–8.3)

## 2016-07-07 LAB — CBC WITH DIFFERENTIAL (CANCER CENTER ONLY)
BASO#: 0 10*3/uL (ref 0.0–0.2)
BASO%: 0.4 % (ref 0.0–2.0)
EOS ABS: 0.1 10*3/uL (ref 0.0–0.5)
EOS%: 2.9 % (ref 0.0–7.0)
HEMATOCRIT: 32.6 % — AB (ref 34.8–46.6)
HEMOGLOBIN: 10.7 g/dL — AB (ref 11.6–15.9)
LYMPH#: 0.8 10*3/uL — AB (ref 0.9–3.3)
LYMPH%: 29.8 % (ref 14.0–48.0)
MCH: 29.7 pg (ref 26.0–34.0)
MCHC: 32.8 g/dL (ref 32.0–36.0)
MCV: 91 fL (ref 81–101)
MONO#: 0.3 10*3/uL (ref 0.1–0.9)
MONO%: 9.8 % (ref 0.0–13.0)
NEUT%: 57.1 % (ref 39.6–80.0)
NEUTROS ABS: 1.6 10*3/uL (ref 1.5–6.5)
Platelets: 163 10*3/uL (ref 145–400)
RBC: 3.6 10*6/uL — AB (ref 3.70–5.32)
RDW: 13.5 % (ref 11.1–15.7)
WBC: 2.8 10*3/uL — AB (ref 3.9–10.0)

## 2016-07-07 LAB — FERRITIN: FERRITIN: 683 ng/mL — AB (ref 9–269)

## 2016-07-07 LAB — IRON AND TIBC
%SAT: >100 % (ref 21–?)
IRON: 181 ug/dL — AB (ref 41–142)
TIBC: 179 ug/dL — AB (ref 236–444)

## 2016-07-07 MED ORDER — PROMETHAZINE HCL 12.5 MG PO TABS: 12.5000 mg | ORAL_TABLET | Freq: Four times a day (QID) | ORAL | 3 refills | Status: DC | PRN

## 2016-07-07 NOTE — Progress Notes (Signed)
Hematology and Oncology Follow Up Visit  Tami [name] [id number] 08/15/1956 60 y.o. 07/07/2016   Principle Diagnosis:  1. Chronic pancytopenia. 2. Status post liver transplant for autoimmune hepatitis. 3. Renal insufficiency with anemia.  Current Therapy:   Aranesp 300 mcg subcu as needed for hemoglobin less than 10.     Interim History:  Ms.  Tami [name] is back for followup. She is doing okay. She feels a tired possibly. Thankfully, her daughter is  home for a week or so from [place]. This is making her happy.   She has had no problems with pain. She is worried about some of the extra fatty tissue that she has from her past surgeries. I told her that I wouldn't think a plastic surgeon would get involved given her history and blood counts.   She saw her pain physician back in early June.  She is had no fever. She has had no cough. There's been no change in bowel or bladder habits. the cyclosporine. Again, the transplant doctors can help manage this.  Overall, her performance status is ECOG 1.   Medications:  Current Outpatient Prescriptions:  .  albuterol (PROVENTIL HFA;VENTOLIN HFA) 108 (90 Base) MCG/ACT inhaler, Inhale into the lungs., Disp: , Rfl:  .  alendronate (FOSAMAX) 70 MG tablet, Take 70 mg by mouth once a week., Disp: , Rfl: 1 .  allopurinol (ZYLOPRIM) 100 MG tablet, Take 100 mg by mouth daily., Disp: , Rfl: 5 .  amLODipine (NORVASC) 5 MG tablet, Take 5 mg by mouth daily., Disp: , Rfl: 5 .  Ascorbic Acid (VITAMIN C) 250 MG CHEW, Chew by mouth every morning., Disp: , Rfl:  .  calcitonin, salmon, (MIACALCIN/FORTICAL) 200 UNIT/ACT nasal spray, Place 1 spray into alternate nostrils daily., Disp: , Rfl:  .  Calcium Carbonate-Vit D-Min (CALCIUM 1200 PO), Take by mouth 2 (two) times daily., Disp: , Rfl:  .  cycloSPORINE modified (NEORAL) 25 MG capsule, Take 25 mg by mouth 2 (two) times daily. Takes 3 tabs in the morning and 2 tabs at bedtime., Disp: , Rfl:  .  diclofenac sodium  (VOLTAREN) 1 % GEL, Note:Apply 4 grams to painful knees, up to 4 times daily, as needed, Disp: , Rfl:  .  gabapentin (NEURONTIN) 300 MG capsule, Take 1 capsule (300 mg total) by mouth 2 (two) times daily., Disp: 60 capsule, Rfl: 5 .  hydrochlorothiazide (HYDRODIURIL) 12.5 MG tablet, Take 2 tablets (25 mg total) by mouth daily., Disp: 60 tablet, Rfl: 3 .  metoprolol succinate (TOPROL-XL) 50 MG 24 hr tablet, Take 50 mg by mouth daily., Disp: , Rfl: 5 .  Multiple Vitamins-Minerals (MULTIVITAMIN WITH MINERALS) tablet, Take 1 tablet by mouth daily.  , Disp: , Rfl:  .  ondansetron (ZOFRAN-ODT) 4 MG disintegrating tablet, Take 4 mg by mouth every 8 (eight) hours as needed for nausea or vomiting., Disp: , Rfl:  .  oxyCODONE (ROXICODONE) 15 MG immediate release tablet, Take 15 mg by mouth every 6 (six) hours as needed., Disp: , Rfl:  .  promethazine (PHENERGAN) 12.5 MG tablet, Take 1 tablet (12.5 mg total) by mouth every 6 (six) hours as needed., Disp: 90 tablet, Rfl: 3 .  Vitamin D, Ergocalciferol, (DRISDOL) 50000 units CAPS capsule, Take 50,000 Units by mouth every 14 (fourteen) days., Disp: , Rfl: 3 .  zolpidem (AMBIEN) 5 MG tablet, Take 1 tablet (5 mg total) by mouth at bedtime as needed for sleep., Disp: 30 tablet, Rfl: 2  Allergies:  Allergies  Allergen Reactions  .  Iodinated Diagnostic Agents   . Ioxaglate   . Latex     Past Medical History, Surgical history, Social history, and Family History were reviewed and updated.  Review of Systems: As above  Physical Exam:  height is 5' (1.524 m) and weight is 150 lb (68 kg). Her oral temperature is 98.1 F (36.7 C). Her blood pressure is 149/81 (abnormal) and her pulse is 68. Her respiration is 16.   Petit African American female. Her head and neck exam shows moderate erythema in the pharynx. I do not see any exudate. Neck is with some slight adenopathy in the upper cervical nodes. Lungs are clear. Cardiac exam regular rate and rhythm. There are no  murmurs, rubs or bruits. Abdomen is soft. Has good bowel sounds. She does have a well-healed laparotomy scar. She has an abdominal wall hernia. There is no fluid wave. No palpable liver or spleen tip. Extremities shows no clubbing cyanosis or edema. Skin exam shows no rashes. Neurological exam no focal neurological deficits.  Lab Results  Component Value Date   WBC 2.8 (L) 07/07/2016   HGB 10.7 (L) 07/07/2016   HCT 32.6 (L) 07/07/2016   MCV 91 07/07/2016   PLT 163 07/07/2016     Chemistry      Component Value Date/Time   NA 136 05/19/2016 0933   K 4.7 05/19/2016 0933   CL 107 12/02/2015 1150   CO2 21 (L) 05/19/2016 0933   BUN 36.9 (H) 05/19/2016 0933   CREATININE 2.1 (H) 05/19/2016 0933      Component Value Date/Time   CALCIUM 8.3 (L) 05/19/2016 0933   ALKPHOS 113 05/19/2016 0933   AST 15 05/19/2016 0933   ALT <9 05/19/2016 0933   BILITOT 0.45 05/19/2016 0933         Impression and Plan: Ms. Ranker is 60 year old African female. She has a history of liver transplant secondary to autoimmune hepatitis. She's done very well with this. Hopefully liver rejection will not be a problem.  She does not feel that well. My sure she has a little bit of a virus.  We will go ahead and get her Phenergan refilled.  She does not need an Aranesp injection today.  We will have her come back in 4 week  [name], MD 9/14/201711:23 AM

## 2016-07-08 LAB — RETICULOCYTES: Reticulocyte Count: 0.9 % (ref 0.6–2.6)

## 2016-08-11 ENCOUNTER — Encounter: Payer: Self-pay | Admitting: Family

## 2016-08-11 ENCOUNTER — Other Ambulatory Visit (HOSPITAL_BASED_OUTPATIENT_CLINIC_OR_DEPARTMENT_OTHER): Payer: Medicaid Other

## 2016-08-11 ENCOUNTER — Ambulatory Visit (HOSPITAL_BASED_OUTPATIENT_CLINIC_OR_DEPARTMENT_OTHER): Payer: Medicaid Other

## 2016-08-11 ENCOUNTER — Ambulatory Visit (HOSPITAL_BASED_OUTPATIENT_CLINIC_OR_DEPARTMENT_OTHER): Payer: Medicaid Other | Admitting: Family

## 2016-08-11 VITALS — BP 152/86 | HR 72 | Temp 98.0°F | Resp 16 | Ht 60.0 in | Wt 155.4 lb

## 2016-08-11 DIAGNOSIS — Z944 Liver transplant status: Secondary | ICD-10-CM

## 2016-08-11 DIAGNOSIS — N189 Chronic kidney disease, unspecified: Secondary | ICD-10-CM

## 2016-08-11 DIAGNOSIS — D631 Anemia in chronic kidney disease: Secondary | ICD-10-CM | POA: Diagnosis not present

## 2016-08-11 DIAGNOSIS — K754 Autoimmune hepatitis: Secondary | ICD-10-CM

## 2016-08-11 DIAGNOSIS — D61818 Other pancytopenia: Secondary | ICD-10-CM

## 2016-08-11 DIAGNOSIS — G4701 Insomnia due to medical condition: Secondary | ICD-10-CM

## 2016-08-11 LAB — CBC WITH DIFFERENTIAL (CANCER CENTER ONLY)
BASO#: 0 10*3/uL (ref 0.0–0.2)
BASO%: 0.4 % (ref 0.0–2.0)
EOS%: 2.2 % (ref 0.0–7.0)
Eosinophils Absolute: 0.1 10*3/uL (ref 0.0–0.5)
HCT: 28.3 % — ABNORMAL LOW (ref 34.8–46.6)
HGB: 9.4 g/dL — ABNORMAL LOW (ref 11.6–15.9)
LYMPH#: 0.6 10*3/uL — ABNORMAL LOW (ref 0.9–3.3)
LYMPH%: 26.5 % (ref 14.0–48.0)
MCH: 30.4 pg (ref 26.0–34.0)
MCHC: 33.2 g/dL (ref 32.0–36.0)
MCV: 92 fL (ref 81–101)
MONO#: 0.3 10*3/uL (ref 0.1–0.9)
MONO%: 11.7 % (ref 0.0–13.0)
NEUT#: 1.4 10*3/uL — ABNORMAL LOW (ref 1.5–6.5)
NEUT%: 59.2 % (ref 39.6–80.0)
PLATELETS: 170 10*3/uL (ref 145–400)
RBC: 3.09 10*6/uL — AB (ref 3.70–5.32)
RDW: 13.9 % (ref 11.1–15.7)
WBC: 2.3 10*3/uL — AB (ref 3.9–10.0)

## 2016-08-11 LAB — IRON AND TIBC
%SAT: 39 % (ref 21–57)
Iron: 73 ug/dL (ref 41–142)
TIBC: 185 ug/dL — AB (ref 236–444)
UIBC: 112 ug/dL — ABNORMAL LOW (ref 120–384)

## 2016-08-11 LAB — COMPREHENSIVE METABOLIC PANEL
ALBUMIN: 3.4 g/dL — AB (ref 3.5–5.0)
ALK PHOS: 86 U/L (ref 40–150)
AST: 17 U/L (ref 5–34)
Anion Gap: 10 mEq/L (ref 3–11)
BILIRUBIN TOTAL: 0.39 mg/dL (ref 0.20–1.20)
BUN: 22.2 mg/dL (ref 7.0–26.0)
CALCIUM: 9 mg/dL (ref 8.4–10.4)
CO2: 20 mEq/L — ABNORMAL LOW (ref 22–29)
CREATININE: 1.5 mg/dL — AB (ref 0.6–1.1)
Chloride: 109 mEq/L (ref 98–109)
EGFR: 45 mL/min/{1.73_m2} — ABNORMAL LOW (ref >90)
Glucose: 87 mg/dl (ref 70–140)
Potassium: 5.3 mEq/L — ABNORMAL HIGH (ref 3.5–5.1)
Sodium: 139 mEq/L (ref 136–145)
Total Protein: 8.9 g/dL — ABNORMAL HIGH (ref 6.4–8.3)

## 2016-08-11 LAB — FERRITIN: FERRITIN: 616 ng/mL — AB (ref 9–269)

## 2016-08-11 MED ORDER — DARBEPOETIN ALFA 300 MCG/0.6ML IJ SOSY
PREFILLED_SYRINGE | INTRAMUSCULAR | Status: AC
  Filled 2016-08-11: qty 0.6

## 2016-08-11 MED ORDER — DARBEPOETIN ALFA 300 MCG/0.6ML IJ SOSY
300.0000 ug | PREFILLED_SYRINGE | Freq: Once | INTRAMUSCULAR | Status: AC
  Administered 2016-08-11: 300 ug via SUBCUTANEOUS

## 2016-08-11 NOTE — Progress Notes (Signed)
Hematology and Oncology Follow Up Visit  Latina Frank [id number] 1956-06-14 60 y.o. 08/11/2016  Principle Diagnosis:  Chronic pancytopenia  Status post liver transplant for autoimmune hepatitis Anemia of chronic kidney disease  Current Therapy:   Aranesp 300 mcg SQ as needed for hemoglobin less than 10    Interim History: Ms. Pollak is here today for a follow-up. She is feeling fatigued but staying busy. She has been taking chair yoga classes and walking 3 days a week for exercise. She plans to go to the race track in [place] with her seniors group to see the Christmas lights and decorations in November.  Hgb today is 9.4. She has had no episodes of bleeding or bruising.  Her pain doctor retired and she has an appointment with a new one tomorrow.  No fever, chills, n/v, cough, rash, headache, dizziness, chest pain, palpitations, abdominal pain or changes in bowel or bladder habits.  She continues to follow-up with transplant team annually at [facility]  No swelling, tenderness, numbness or tingling in her extremities at this time. She has chronic lower back and knee pain that comes and goes. No lymphadenopathy found on exam. She has maintained a good appetite and is staying well hydrated. Her weight is stable.   Medications:    Medication List       Accurate as of 08/11/16 10:36 AM. Always use your most recent med list.          albuterol 108 (90 Base) MCG/ACT inhaler Commonly known as:  PROVENTIL HFA;VENTOLIN HFA Inhale into the lungs.   alendronate 70 MG tablet Commonly known as:  FOSAMAX Take 70 mg by mouth once a week.   allopurinol 100 MG tablet Commonly known as:  ZYLOPRIM Take 100 mg by mouth daily.   amLODipine 5 MG tablet Commonly known as:  NORVASC Take 5 mg by mouth daily.   calcitonin (salmon) 200 UNIT/ACT nasal spray Commonly known as:  MIACALCIN/FORTICAL Place 1 spray into alternate nostrils daily.   CALCIUM 1200 PO Take by mouth 2 (two) times  daily.   gabapentin 300 MG capsule Commonly known as:  NEURONTIN Take 1 capsule (300 mg total) by mouth 2 (two) times daily.   hydrochlorothiazide 12.5 MG tablet Commonly known as:  HYDRODIURIL Take 2 tablets (25 mg total) by mouth daily.   metoprolol succinate 50 MG 24 hr tablet Commonly known as:  TOPROL-XL Take 50 mg by mouth daily.   multivitamin with minerals tablet Take 1 tablet by mouth daily.   NEORAL 25 MG capsule Generic drug:  cycloSPORINE modified Take 25 mg by mouth 2 (two) times daily. Takes 3 tabs in the morning and 2 tabs at bedtime.   ondansetron 4 MG disintegrating tablet Commonly known as:  ZOFRAN-ODT Take 4 mg by mouth every 8 (eight) hours as needed for nausea or vomiting.   oxyCODONE 15 MG immediate release tablet Commonly known as:  ROXICODONE Take 15 mg by mouth every 6 (six) hours as needed.   promethazine 12.5 MG tablet Commonly known as:  PHENERGAN Take 1 tablet (12.5 mg total) by mouth every 6 (six) hours as needed.   Vitamin C 250 MG Chew Chew by mouth every morning.   Vitamin D (Ergocalciferol) 50000 units Caps capsule Commonly known as:  DRISDOL Take 50,000 Units by mouth every 14 (fourteen) days.   VOLTAREN 1 % Gel Generic drug:  diclofenac sodium Note:Apply 4 grams to painful knees, up to 4 times daily, as needed   zolpidem 5 MG tablet  Commonly known as:  AMBIEN Take 1 tablet (5 mg total) by mouth at bedtime as needed for sleep.      Allergies:  Allergies  Allergen Reactions  . Iodinated Diagnostic Agents   . Ioxaglate   . Latex     Past Medical History, Surgical history, Social history, and Family History were reviewed and updated.  Review of Systems: All other 10 point review of systems is negative.   Physical Exam:  vitals were not taken for this visit.  Wt Readings from Last 3 Encounters:  07/07/16 150 lb (68 kg)  05/19/16 152 lb (68.9 kg)  04/05/16 157 lb (71.2 kg)    Ocular: Sclerae unicteric, pupils equal,  round and reactive to light Ear-nose-throat: Oropharynx clear, dentition fair Lymphatic: No cervical supraclavicular or axillary adenopathy Lungs no rales or rhonchi, good excursion bilaterally Heart regular rate and rhythm, no murmur appreciated Abd soft, nontender, positive bowel sounds, no liver or spleen tip palpated on exam, no fluid wave MSK no focal spinal tenderness, no joint edema Neuro: non-focal, well-oriented, appropriate affect Breasts: Deferred  Lab Results  Component Value Date   WBC 2.3 (L) 08/11/2016   HGB 9.4 (L) 08/11/2016   HCT 28.3 (L) 08/11/2016   MCV 92 08/11/2016   PLT 170 08/11/2016   Lab Results  Component Value Date   FERRITIN 683 (H) 07/07/2016   IRON 181 (H) 07/07/2016   TIBC 179 (L) 07/07/2016   UIBC <1.0 07/07/2016   IRONPCTSAT >100 07/07/2016   Lab Results  Component Value Date   RETICCTPCT 1.2 09/29/2015   RBC 3.09 (L) 08/11/2016   RETICCTABS 38.0 09/29/2015   No results found for: KPAFRELGTCHN, LAMBDASER, KAPLAMBRATIO No results found for: Kandis Cocking, IGMSERUM Lab Results  Component Value Date   TOTALPROTELP 9.4 (H) 11/19/2008   ALBUMINELP 41.1 (L) 11/19/2008   A1GS 4.0 11/19/2008   A2GS 7.1 11/19/2008   BETS 4.0 (L) 11/19/2008   BETA2SER 7.1 (H) 11/19/2008   GAMS 36.7 (H) 11/19/2008   MSPIKE NOT DET 11/19/2008   SPEI * 11/19/2008     Chemistry      Component Value Date/Time   NA 138 07/07/2016 0944   K 5.0 07/07/2016 0944   CL 107 12/02/2015 1150   CO2 19 (L) 07/07/2016 0944   BUN 22.0 07/07/2016 0944   CREATININE 1.5 (H) 07/07/2016 0944      Component Value Date/Time   CALCIUM 9.0 07/07/2016 0944   ALKPHOS 104 07/07/2016 0944   AST 16 07/07/2016 0944   ALT 10 07/07/2016 0944   BILITOT 0.31 07/07/2016 0944     Impression and Plan: Ms. Brester is 60 yo African American female with anemia of chronic kidney disease. She is symptomatic at this time with fatigue. Her Hgb is 9.4 today so she will receive Aranesp.  She also  has a history of liver transplant secondary to autoimmune hepatitis. So far, there has been no evidence of rejection. She continues to follow up with her transplant team at [facility] annually.   We will plan to see her back in 6 weeks for repeat lab work and follow-up. She knows to call here with any questions or concerns. We can certainly see her back sooner if need be.   Eliezer Bottom, NP  10/19/201710:36 AM

## 2016-08-11 NOTE — Patient Instructions (Signed)
Darbepoetin Alfa injection What is this medicine? DARBEPOETIN ALFA (dar be POE e tin AL fa) helps your body make more red blood cells. It is used to treat anemia caused by chronic kidney failure and chemotherapy. This medicine may be used for other purposes; ask your health care provider or pharmacist if you have questions. What should I tell my health care provider before I take this medicine? They need to know if you have any of these conditions: -blood clotting disorders or history of blood clots -cancer patient not on chemotherapy -cystic fibrosis -heart disease, such as angina, heart failure, or a history of a heart attack -hemoglobin level of 12 g/dL or greater -high blood pressure -low levels of folate, iron, or vitamin B12 -seizures -an unusual or allergic reaction to darbepoetin, erythropoietin, albumin, hamster proteins, latex, other medicines, foods, dyes, or preservatives -pregnant or trying to get pregnant -breast-feeding How should I use this medicine? This medicine is for injection into a vein or under the skin. It is usually given by a health care professional in a hospital or clinic setting. If you get this medicine at home, you will be taught how to prepare and give this medicine. Do not shake the solution before you withdraw a dose. Use exactly as directed. Take your medicine at regular intervals. Do not take your medicine more often than directed. It is important that you put your used needles and syringes in a special sharps container. Do not put them in a trash can. If you do not have a sharps container, call your pharmacist or healthcare provider to get one. Talk to your pediatrician regarding the use of this medicine in children. While this medicine may be used in children as young as 1 year for selected conditions, precautions do apply. Overdosage: If you think you have taken too much of this medicine contact a poison control center or emergency room at once. NOTE:  This medicine is only for you. Do not share this medicine with others. What if I miss a dose? If you miss a dose, take it as soon as you can. If it is almost time for your next dose, take only that dose. Do not take double or extra doses. What may interact with this medicine? Do not take this medicine with any of the following medications: -epoetin alfa This list may not describe all possible interactions. Give your health care provider a list of all the medicines, herbs, non-prescription drugs, or dietary supplements you use. Also tell them if you smoke, drink alcohol, or use illegal drugs. Some items may interact with your medicine. What should I watch for while using this medicine? Visit your prescriber or health care professional for regular checks on your progress and for the needed blood tests and blood pressure measurements. It is especially important for the doctor to make sure your hemoglobin level is in the desired range, to limit the risk of potential side effects and to give you the best benefit. Keep all appointments for any recommended tests. Check your blood pressure as directed. Ask your doctor what your blood pressure should be and when you should contact him or her. As your body makes more red blood cells, you may need to take iron, folic acid, or vitamin B supplements. Ask your doctor or health care provider which products are right for you. If you have kidney disease continue dietary restrictions, even though this medication can make you feel better. Talk with your doctor or health care professional about the   foods you eat and the vitamins that you take. What side effects may I notice from receiving this medicine? Side effects that you should report to your doctor or health care professional as soon as possible: -allergic reactions like skin rash, itching or hives, swelling of the face, lips, or tongue -breathing problems -changes in vision -chest pain -confusion, trouble speaking  or understanding -feeling faint or lightheaded, falls -high blood pressure -muscle aches or pains -pain, swelling, warmth in the leg -rapid weight gain -severe headaches -sudden numbness or weakness of the face, arm or leg -trouble walking, dizziness, loss of balance or coordination -seizures (convulsions) -swelling of the ankles, feet, hands -unusually weak or tired Side effects that usually do not require medical attention (report to your doctor or health care professional if they continue or are bothersome): -diarrhea -fever, chills (flu-like symptoms) -headaches -nausea, vomiting -redness, stinging, or swelling at site where injected This list may not describe all possible side effects. Call your doctor for medical advice about side effects. You may report side effects to FDA at 1-800-FDA-1088. Where should I keep my medicine? Keep out of the reach of children. Store in a refrigerator between 2 and 8 degrees C (36 and 46 degrees F). Do not freeze. Do not shake. Throw away any unused portion if using a single-dose vial. Throw away any unused medicine after the expiration date. NOTE: This sheet is a summary. It may not cover all possible information. If you have questions about this medicine, talk to your doctor, pharmacist, or health care provider.    2016, Elsevier/Gold Standard. (2008-09-23 10:23:57)  

## 2016-08-25 ENCOUNTER — Telehealth: Payer: Self-pay | Admitting: *Deleted

## 2016-08-25 NOTE — Telephone Encounter (Signed)
Patient is requesting a referral to [facility]. She states she spoke to Judson Roch about this.   Checked with Laverna Peace NP and she states that the referral must come from the physician who is treating the issues which are causing the patient pain. We only treat patient for anemia.   Informed patient of this.

## 2016-09-22 ENCOUNTER — Encounter: Payer: Self-pay | Admitting: *Deleted

## 2016-09-22 ENCOUNTER — Encounter: Payer: Self-pay | Admitting: Oncology

## 2016-09-22 ENCOUNTER — Ambulatory Visit: Payer: Medicaid Other

## 2016-09-22 ENCOUNTER — Ambulatory Visit (HOSPITAL_BASED_OUTPATIENT_CLINIC_OR_DEPARTMENT_OTHER): Payer: Medicaid Other | Admitting: Family

## 2016-09-22 ENCOUNTER — Other Ambulatory Visit (HOSPITAL_BASED_OUTPATIENT_CLINIC_OR_DEPARTMENT_OTHER): Payer: Medicaid Other

## 2016-09-22 VITALS — BP 153/87 | HR 74 | Temp 98.3°F | Resp 20 | Wt 157.4 lb

## 2016-09-22 DIAGNOSIS — K754 Autoimmune hepatitis: Secondary | ICD-10-CM

## 2016-09-22 DIAGNOSIS — D631 Anemia in chronic kidney disease: Secondary | ICD-10-CM

## 2016-09-22 DIAGNOSIS — N189 Chronic kidney disease, unspecified: Secondary | ICD-10-CM | POA: Diagnosis not present

## 2016-09-22 DIAGNOSIS — J0110 Acute frontal sinusitis, unspecified: Secondary | ICD-10-CM

## 2016-09-22 DIAGNOSIS — D61818 Other pancytopenia: Secondary | ICD-10-CM

## 2016-09-22 DIAGNOSIS — M545 Low back pain: Secondary | ICD-10-CM

## 2016-09-22 DIAGNOSIS — J019 Acute sinusitis, unspecified: Secondary | ICD-10-CM | POA: Diagnosis not present

## 2016-09-22 DIAGNOSIS — Z944 Liver transplant status: Secondary | ICD-10-CM

## 2016-09-22 DIAGNOSIS — G8929 Other chronic pain: Secondary | ICD-10-CM

## 2016-09-22 LAB — COMPREHENSIVE METABOLIC PANEL
ALT: 9 U/L (ref 0–55)
AST: 16 U/L (ref 5–34)
Albumin: 3.6 g/dL (ref 3.5–5.0)
Alkaline Phosphatase: 99 U/L (ref 40–150)
Anion Gap: 7 mEq/L (ref 3–11)
BILIRUBIN TOTAL: 0.42 mg/dL (ref 0.20–1.20)
BUN: 31.8 mg/dL — AB (ref 7.0–26.0)
CO2: 20 meq/L — AB (ref 22–29)
CREATININE: 1.8 mg/dL — AB (ref 0.6–1.1)
Calcium: 8.9 mg/dL (ref 8.4–10.4)
Chloride: 109 mEq/L (ref 98–109)
EGFR: 36 mL/min/{1.73_m2} — AB (ref >90)
GLUCOSE: 90 mg/dL (ref 70–140)
Potassium: 4.8 mEq/L (ref 3.5–5.1)
SODIUM: 137 meq/L (ref 136–145)
TOTAL PROTEIN: 9 g/dL — AB (ref 6.4–8.3)

## 2016-09-22 LAB — CBC WITH DIFFERENTIAL (CANCER CENTER ONLY)
BASO#: 0 10*3/uL (ref 0.0–0.2)
BASO%: 0.4 % (ref 0.0–2.0)
EOS ABS: 0.1 10*3/uL (ref 0.0–0.5)
EOS%: 2.9 % (ref 0.0–7.0)
HEMATOCRIT: 31.3 % — AB (ref 34.8–46.6)
HGB: 10.7 g/dL — ABNORMAL LOW (ref 11.6–15.9)
LYMPH#: 0.8 10*3/uL — AB (ref 0.9–3.3)
LYMPH%: 26.8 % (ref 14.0–48.0)
MCH: 30.6 pg (ref 26.0–34.0)
MCHC: 34.2 g/dL (ref 32.0–36.0)
MCV: 89 fL (ref 81–101)
MONO#: 0.3 10*3/uL (ref 0.1–0.9)
MONO%: 11.1 % (ref 0.0–13.0)
NEUT#: 1.7 10*3/uL (ref 1.5–6.5)
NEUT%: 58.8 % (ref 39.6–80.0)
Platelets: 123 10*3/uL — ABNORMAL LOW (ref 145–400)
RBC: 3.5 10*6/uL — ABNORMAL LOW (ref 3.70–5.32)
RDW: 12.8 % (ref 11.1–15.7)
WBC: 2.8 10*3/uL — ABNORMAL LOW (ref 3.9–10.0)

## 2016-09-22 LAB — IRON AND TIBC
%SAT: 90 % — ABNORMAL HIGH (ref 21–57)
Iron: 172 ug/dL — ABNORMAL HIGH (ref 41–142)
TIBC: 191 ug/dL — AB (ref 236–444)
UIBC: 20 ug/dL — ABNORMAL LOW (ref 120–384)

## 2016-09-22 LAB — FERRITIN: Ferritin: 510 ng/ml — ABNORMAL HIGH (ref 9–269)

## 2016-09-22 MED ORDER — DARBEPOETIN ALFA 300 MCG/0.6ML IJ SOSY
PREFILLED_SYRINGE | INTRAMUSCULAR | Status: AC
  Filled 2016-09-22: qty 0.6

## 2016-09-22 MED ORDER — AZITHROMYCIN 250 MG PO TABS: ORAL_TABLET | ORAL | 0 refills | Status: DC

## 2016-09-22 MED ORDER — OXYCODONE HCL 15 MG PO TABS: 15.0000 mg | ORAL_TABLET | Freq: Four times a day (QID) | ORAL | 0 refills | Status: DC | PRN

## 2016-09-22 MED FILL — AZITHROMYCIN 250 MG TABLET: 250 | 5 days supply | Qty: 6 | Fill #0

## 2016-09-22 MED FILL — oxyCODONE HCL 15 MG TABS: 15 | 15 days supply | Qty: 60 | Fill #0

## 2016-09-22 NOTE — Progress Notes (Signed)
Hematology and Oncology Follow Up Visit  Tami [name] [id number] [birth year] 60 y.o. 09/22/2016  Principle Diagnosis:  Chronic pancytopenia  Status post liver transplant for autoimmune hepatitis Anemia of chronic kidney disease  Current Therapy:   Aranesp 300 mcg SQ as needed for hemoglobin less than 10    Interim History: Tami [name] is here today for a follow-up. Tami [name] is teary and in a great deal of lower back pain. Her pain management doctor left and for the last 2 months Tami [name] has been trying to find a new one. However Tami [name] has not been able to find one that accepts Medicaid. Tami [name] is requesting that we give her a one month supply to last her until Tami [name] can get into a new office. I spoke with Dr. Marin Olp and we will give her the one month supply. Tami [name] verbalized understanding and agreement that this is only a one time refill and that all other refills will need to be through her PCP or pain management.  Tami [name] is also complaining of a cough and sinus congestion. The cough is not productive.  No fever, chills, n/v, rash, headache, dizziness, SOB, chest pain, palpitations, abdominal pain or changes in bowel or bladder habits.  Her counts have responded nicely to Aranesp. Hgb is now up to 10.7 with an MCV of 89.  Tami [name] continues to follow-up with transplant team annually at [facility] and sees them again in December.  No swelling, tenderness, numbness or tingling in her extremities at this time. No lymphadenopathy found on exam. Tami [name] has maintained a good appetite and is staying well hydrated. Her weight is up 7 lbs since her last visit.   Medications:    Medication List       Accurate as of 09/22/16  9:46 AM. Always use your most recent med list.          albuterol 108 (90 Base) MCG/ACT inhaler Commonly known as:  PROVENTIL HFA;VENTOLIN HFA Inhale into the lungs.   alendronate 70 MG tablet Commonly known as:  FOSAMAX Take 70 mg by mouth once a week.   allopurinol 100 MG tablet Commonly  known as:  ZYLOPRIM Take 100 mg by mouth daily.   amLODipine 5 MG tablet Commonly known as:  NORVASC Take 5 mg by mouth daily.   calcitonin (salmon) 200 UNIT/ACT nasal spray Commonly known as:  MIACALCIN/FORTICAL Place 1 spray into alternate nostrils daily.   CALCIUM 1200 PO Take by mouth 2 (two) times daily.   gabapentin 300 MG capsule Commonly known as:  NEURONTIN Take 1 capsule (300 mg total) by mouth 2 (two) times daily.   hydrochlorothiazide 12.5 MG tablet Commonly known as:  HYDRODIURIL Take 2 tablets (25 mg total) by mouth daily.   metoprolol succinate 50 MG 24 hr tablet Commonly known as:  TOPROL-XL Take 50 mg by mouth daily.   multivitamin with minerals tablet Take 1 tablet by mouth daily.   NEORAL 25 MG capsule Generic drug:  cycloSPORINE modified Take 25 mg by mouth 2 (two) times daily. Takes 3 tabs in the morning and 2 tabs at bedtime.   ondansetron 4 MG disintegrating tablet Commonly known as:  ZOFRAN-ODT Take 4 mg by mouth every 8 (eight) hours as needed for nausea or vomiting.   oxyCODONE 15 MG immediate release tablet Commonly known as:  ROXICODONE Take 15 mg by mouth every 6 (six) hours as needed.   promethazine 12.5 MG tablet Commonly known as:  PHENERGAN Take 1 tablet (12.5 mg total) by mouth every  6 (six) hours as needed.   Vitamin C 250 MG Chew Chew by mouth every morning.   Vitamin D (Ergocalciferol) 50000 units Caps capsule Commonly known as:  DRISDOL Take 50,000 Units by mouth every 14 (fourteen) days.   VOLTAREN 1 % Gel Generic drug:  diclofenac sodium Note:Apply 4 grams to painful knees, up to 4 times daily, as needed   zolpidem 5 MG tablet Commonly known as:  AMBIEN Take 1 tablet (5 mg total) by mouth at bedtime as needed for sleep.      Allergies:  Allergies  Allergen Reactions  . Iodinated Diagnostic Agents   . Ioxaglate   . Latex     Past Medical History, Surgical history, Social history, and Family History were  reviewed and updated.  Review of Systems: All other 10 point review of systems is negative.   Physical Exam:  vitals were not taken for this visit.  Wt Readings from Last 3 Encounters:  08/11/16 155 lb 6.4 oz (70.5 kg)  07/07/16 150 lb (68 kg)  05/19/16 152 lb (68.9 kg)    Ocular: Sclerae unicteric, pupils equal, round and reactive to light Ear-nose-throat: Oropharynx clear, dentition fair Lymphatic: No cervical supraclavicular or axillary adenopathy Lungs no rales or rhonchi, good excursion bilaterally Heart regular rate and rhythm, no murmur appreciated Abd soft, nontender, positive bowel sounds, no liver or spleen tip palpated on exam, no fluid wave MSK no focal spinal tenderness, no joint edema Neuro: non-focal, well-oriented, appropriate affect Breasts: Deferred  Lab Results  Component Value Date   WBC 2.3 (L) 08/11/2016   HGB 9.4 (L) 08/11/2016   HCT 28.3 (L) 08/11/2016   MCV 92 08/11/2016   PLT 170 08/11/2016   Lab Results  Component Value Date   FERRITIN 616 (H) 08/11/2016   IRON 73 08/11/2016   TIBC 185 (L) 08/11/2016   UIBC 112 (L) 08/11/2016   IRONPCTSAT 39 08/11/2016   Lab Results  Component Value Date   RETICCTPCT 1.2 09/29/2015   RBC 3.09 (L) 08/11/2016   RETICCTABS 38.0 09/29/2015   No results found for: KPAFRELGTCHN, LAMBDASER, KAPLAMBRATIO No results found for: Kandis Cocking, IGMSERUM Lab Results  Component Value Date   TOTALPROTELP 9.4 (H) 11/19/2008   ALBUMINELP 41.1 (L) 11/19/2008   A1GS 4.0 11/19/2008   A2GS 7.1 11/19/2008   BETS 4.0 (L) 11/19/2008   BETA2SER 7.1 (H) 11/19/2008   GAMS 36.7 (H) 11/19/2008   MSPIKE NOT DET 11/19/2008   SPEI * 11/19/2008     Chemistry      Component Value Date/Time   NA 139 08/11/2016 1017   K 5.3 (H) 08/11/2016 1017   CL 107 12/02/2015 1150   CO2 20 (L) 08/11/2016 1017   BUN 22.2 08/11/2016 1017   CREATININE 1.5 (H) 08/11/2016 1017      Component Value Date/Time   CALCIUM 9.0 08/11/2016 1017    ALKPHOS 86 08/11/2016 1017   AST 17 08/11/2016 1017   ALT <6 08/11/2016 1017   BILITOT 0.39 08/11/2016 1017     Impression and Plan: Tami [name] is 60 yo African American female with anemia of chronic kidney disease. Tami [name] is in a great deal of lower back pain today and is searching for a pain management doctor. We will help her out with a one month supply to get her through until Tami [name] can get into a new office. Tami [name] understands that this is a one time refill only.  Her Hbg is improved at 10.7 so Tami [name] will not need  Aranesp today.  A prescription was sent in for a Z-pack for her acute sinus infection. Tami [name] really is pitiful today.  Tami [name] also has a history of liver transplant secondary to autoimmune hepatitis. So far, there has been no evidence of rejection. Tami [name] continues to follow up with her transplant team at [facility] annually and see them again in mid December.   We will plan to see her back in 2 months for repeat lab work and follow-up. Tami [name] knows to call here with any questions or concerns. We can certainly see her back sooner if need be.   Eliezer Bottom, NP  11/30/20179:46 AM

## 2016-09-23 LAB — ERYTHROPOIETIN: ERYTHROPOIETIN: 5.2 m[IU]/mL (ref 2.6–18.5)

## 2016-10-22 ENCOUNTER — Encounter (HOSPITAL_BASED_OUTPATIENT_CLINIC_OR_DEPARTMENT_OTHER): Payer: Self-pay | Admitting: *Deleted

## 2016-10-22 ENCOUNTER — Emergency Department (HOSPITAL_BASED_OUTPATIENT_CLINIC_OR_DEPARTMENT_OTHER)
Admission: EM | Admit: 2016-10-22 | Discharge: 2016-10-22 | Disposition: A | Discharge status: Home / Self Care | Payer: Medicaid Other | Attending: Emergency Medicine | Admitting: Emergency Medicine

## 2016-10-22 DIAGNOSIS — I10 Essential (primary) hypertension: Secondary | ICD-10-CM | POA: Diagnosis not present

## 2016-10-22 DIAGNOSIS — Z91040 Latex allergy status: Secondary | ICD-10-CM | POA: Diagnosis not present

## 2016-10-22 DIAGNOSIS — E119 Type 2 diabetes mellitus without complications: Secondary | ICD-10-CM | POA: Diagnosis not present

## 2016-10-22 DIAGNOSIS — M545 Low back pain: Secondary | ICD-10-CM | POA: Diagnosis not present

## 2016-10-22 DIAGNOSIS — G8929 Other chronic pain: Secondary | ICD-10-CM

## 2016-10-22 DIAGNOSIS — Z79899 Other long term (current) drug therapy: Secondary | ICD-10-CM | POA: Insufficient documentation

## 2016-10-22 HISTORY — DX: Unspecified osteoarthritis, unspecified site: M19.90

## 2016-10-22 MED ORDER — OXYCODONE HCL 5 MG PO TABS
15.0000 mg | ORAL_TABLET | Freq: Once | ORAL | Status: AC
  Administered 2016-10-22: 15 mg via ORAL
  Filled 2016-10-22: qty 3

## 2016-10-22 NOTE — ED Triage Notes (Signed)
Patient c/o mid lower back pain that has grown worse since Wednesday. She states that she has had some diarrhea also & sone nausea. She states that she is out of pain medication

## 2016-10-22 NOTE — ED Provider Notes (Signed)
[facility] DEPT [facility] Provider Note   CSN: [id number] Arrival date & time: 10/22/16  0740     History   Chief Complaint Chief Complaint  Patient presents with  . Back Pain    HPI Tami [name] is a 60 y.o. female. She presents with a plane of back pain. She takes 15 mg OxyContin immediate release tablets daily. She is prescribed these for her primary care physician Dr. at the [facility] at [facility].  Per review of the [place] controlled substance database she was given a prescription for a month's supply on 11/30.    States she has been out of them for a week. She is hesitant to offer an explanation, however ultimately relents that she had a lot of family over for the holiday and "someone stole them".  The pain is no different than her usual. It is well localized in the back. No radiation of the lower extremities. No bowel or bladder changes. No fever.    HPI  Past Medical History:  Diagnosis Date  . Arthritis   . Hepatitis, autoimmune ([place]) 12/08/2011  . Hypertension     Patient Active Problem List   Diagnosis Date Noted  . Encounter for therapeutic drug level monitoring 10/22/2014  . DDD (degenerative disc disease), lumbosacral 01/02/2014  . Diabetes ([place]) 01/02/2014  . Disorder of sacrum 01/02/2014  . BP (high blood pressure) 01/02/2014  . Back disorder 01/02/2014  . Thoracic and lumbosacral neuritis 01/02/2014  . Osteoarthritis of right knee 09/13/2013  . Chronic low back pain 09/13/2013  . Arthritis of knee, degenerative 09/13/2013  . Body aches 07/26/2013  . Pain in thoracic spine 07/26/2013  . Extremity pain 05/28/2013  . Clinical depression 03/30/2013  . HLD (hyperlipidemia) 03/30/2013  . Cannot sleep 03/30/2013  . 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 H/O liver transplant ([place]) 03/30/2013  . Malaise and fatigue 03/30/2013  . OP (osteoporosis) 03/30/2013  . Arthralgia of lower leg 03/30/2013  . Breath shortness 03/30/2013  .  Avitaminosis D 03/30/2013  . Hepatitis, autoimmune ([place]) 12/08/2011  . Autoimmune hepatitis ([place]) 12/08/2011  . Anemia of renal disease 09/28/2011  . Pancytopenia ([place]) 09/28/2011  . Bone marrow failure ([place]) 09/28/2011  . Encounter for general adult medical examination without abnormal findings 07/14/2010    Past Surgical History:  Procedure Laterality Date  . ABDOMINAL HYSTERECTOMY    . LIVER TRANSPLANT     1996  . TONSILLECTOMY      OB History    No data available       Home Medications    Prior to Admission medications   Medication Sig Start Date End Date Taking? Authorizing Provider  albuterol (PROVENTIL HFA;VENTOLIN HFA) 108 (90 Base) MCG/ACT inhaler Inhale into the lungs. 03/24/16 03/24/17  Historical Provider, MD  alendronate (FOSAMAX) 70 MG tablet Take 70 mg by mouth once a week. 10/07/15   Historical Provider, MD  allopurinol (ZYLOPRIM) 100 MG tablet Take 100 mg by mouth daily. 03/18/15   Historical Provider, MD  amLODipine (NORVASC) 5 MG tablet Take 5 mg by mouth daily. 12/28/15   Historical Provider, MD  Ascorbic Acid (VITAMIN C) 250 MG CHEW Chew by mouth every morning.    Historical Provider, MD  azithromycin (ZITHROMAX Z-PAK) 250 MG tablet Take as directed on package. 09/22/16   Eliezer Bottom, NP  calcitonin, salmon, (MIACALCIN/FORTICAL) 200 UNIT/ACT nasal spray Place 1 spray into alternate nostrils daily.    Historical Provider, MD  Calcium Carbonate-Vit D-Min (CALCIUM 1200 PO) Take  by mouth 2 (two) times daily.    Historical Provider, MD  cycloSPORINE modified (NEORAL) 25 MG capsule Take 25 mg by mouth 2 (two) times daily. Takes 3 tabs in the morning and 2 tabs at bedtime.    Historical Provider, MD  diclofenac sodium (VOLTAREN) 1 % GEL Note:Apply 4 grams to painful knees, up to 4 times daily, as needed 10/14/15   Historical Provider, MD  gabapentin (NEURONTIN) 300 MG capsule Take 1 capsule (300 mg total) by mouth 2 (two) times daily. 04/15/15   [name], MD    hydrochlorothiazide (HYDRODIURIL) 12.5 MG tablet Take 2 tablets (25 mg total) by mouth daily. 10/03/14   Eliezer Bottom, NP  metoprolol succinate (TOPROL-XL) 50 MG 24 hr tablet Take 50 mg by mouth daily. 03/18/15   Historical Provider, MD  Multiple Vitamins-Minerals (MULTIVITAMIN WITH MINERALS) tablet Take 1 tablet by mouth daily.      Historical Provider, MD  ondansetron (ZOFRAN-ODT) 4 MG disintegrating tablet Take 4 mg by mouth every 8 (eight) hours as needed for nausea or vomiting.    Historical Provider, MD  oxyCODONE (ROXICODONE) 15 MG immediate release tablet Take 1 tablet (15 mg total) by mouth every 6 (six) hours as needed. 09/22/16   Eliezer Bottom, NP  promethazine (PHENERGAN) 12.5 MG tablet Take 1 tablet (12.5 mg total) by mouth every 6 (six) hours as needed. 07/07/16 10/02/17  [name], MD  Vitamin D, Ergocalciferol, (DRISDOL) 50000 units CAPS capsule Take 50,000 Units by mouth every 14 (fourteen) days. 11/10/15   Historical Provider, MD    Family History Family History  Problem Relation Age of Onset  . Stroke Mother   . Cancer Father     Social History Social History  Substance Use Topics  . Smoking status: Never Smoker  . Smokeless tobacco: Never Used     Comment: never used tobacco  . Alcohol use 0.0 oz/week     Allergies   Iodinated diagnostic agents; Ioxaglate; and Latex   Review of Systems Review of Systems  Constitutional: Negative for appetite change, chills, diaphoresis, fatigue and fever.  HENT: Negative for mouth sores, sore throat and trouble swallowing.   Eyes: Negative for visual disturbance.  Respiratory: Negative for cough, chest tightness, shortness of breath and wheezing.   Cardiovascular: Negative for chest pain.  Gastrointestinal: Positive for nausea. Negative for abdominal distention, abdominal pain, diarrhea and vomiting.  Endocrine: Negative for polydipsia, polyphagia and polyuria.  Genitourinary: Negative for dysuria, frequency  and hematuria.  Musculoskeletal: Positive for back pain. Negative for gait problem.  Skin: Negative for color change, pallor and rash.  Neurological: Negative for dizziness, syncope, light-headedness and headaches.  Hematological: Does not bruise/bleed easily.  Psychiatric/Behavioral: Negative for behavioral problems and confusion.     Physical Exam Updated Vital Signs BP (!) 164/102 (BP Location: Right Arm) Comment: Has not taken BP medication today   Pulse 80   Temp 98.2 F (36.8 C) (Oral)   Resp 18   Ht 5\' 2"  (1.575 m)   Wt 157 lb (71.2 kg)   SpO2 100%   BMI 28.72 kg/m   Physical Exam  Constitutional: She is oriented to person, place, and time. She appears well-developed and well-nourished. No distress.  HENT:  Head: Normocephalic.  Eyes: Conjunctivae are normal. Pupils are equal, round, and reactive to light. No scleral icterus.  Neck: Normal range of motion. Neck supple. No thyromegaly present.  Cardiovascular: Normal rate and regular rhythm.  Exam reveals no gallop and  no friction rub.   No murmur heard. Pulmonary/Chest: Effort normal and breath sounds normal. No respiratory distress. She has no wheezes. She has no rales.  Abdominal: Soft. Bowel sounds are normal. She exhibits no distension. There is no tenderness. There is no rebound.  Musculoskeletal: Normal range of motion.  Neurological: She is alert and oriented to person, place, and time.   Norma symmetric strength to flex/.extend hip and knees, dorsi/plantar flex ankles. Normal symmetric sensation to all distributions to LEs Patellar and achilles reflexes 1-2+. Downgoing Babinski  Skin: Skin is warm and dry. No rash noted.  Psychiatric: She has a normal mood and affect. Her behavior is normal.     ED Treatments / Results  Labs (all labs ordered are listed, but only abnormal results are displayed) Labs Reviewed - No data to display  EKG  EKG Interpretation None       Radiology No results  found.  Procedures Procedures (including critical care time)  Medications Ordered in ED Medications  oxyCODONE (Oxy IR/ROXICODONE) immediate release tablet 15 mg (not administered)     Initial Impression / Assessment and Plan / ED Course  I have reviewed the triage vital signs and the nursing notes.  Pertinent labs & imaging results that were available during my care of the patient were reviewed by me and considered in my medical decision making (see chart for details).  Clinical Course     I offered patient a single dose of her pain medicine for today. Had a frank discussion with her that she should report the theft of her narcotic as this is a felony. I discussed with her at length at [facility] emergency department policy forbids refilling of prescriptions for chronic narcotic pain medications.  She states that she can see her physician on Monday. She has an appointment for pain management on the 11th of January.  Final Clinical Impressions(s) / ED Diagnoses   Final diagnoses:  Chronic low back pain, unspecified back pain laterality, with sciatica presence unspecified    New Prescriptions New Prescriptions   No medications on file     Tanna Furry, MD 10/22/16 [phone]

## 2016-10-22 NOTE — Discharge Instructions (Signed)
You should report the theft of your narcotics to the police. This is a felony.  [facility] Emergency Department policy forbids prescribing refills of chronic narcotic pain medicine prescriptions.  Follow-up with Dr. Luvenia Starch.

## 2016-11-21 ENCOUNTER — Other Ambulatory Visit: Payer: Self-pay | Admitting: Hematology & Oncology

## 2016-11-21 DIAGNOSIS — D61818 Other pancytopenia: Secondary | ICD-10-CM

## 2016-11-21 DIAGNOSIS — G4701 Insomnia due to medical condition: Secondary | ICD-10-CM

## 2016-11-21 DIAGNOSIS — K754 Autoimmune hepatitis: Secondary | ICD-10-CM

## 2016-11-21 DIAGNOSIS — N189 Chronic kidney disease, unspecified: Secondary | ICD-10-CM

## 2016-11-21 DIAGNOSIS — D631 Anemia in chronic kidney disease: Secondary | ICD-10-CM

## 2016-11-23 ENCOUNTER — Ambulatory Visit (HOSPITAL_BASED_OUTPATIENT_CLINIC_OR_DEPARTMENT_OTHER): Payer: Medicaid Other

## 2016-11-23 ENCOUNTER — Ambulatory Visit (HOSPITAL_BASED_OUTPATIENT_CLINIC_OR_DEPARTMENT_OTHER): Payer: Medicaid Other | Admitting: Family

## 2016-11-23 ENCOUNTER — Other Ambulatory Visit (HOSPITAL_BASED_OUTPATIENT_CLINIC_OR_DEPARTMENT_OTHER): Payer: Medicaid Other

## 2016-11-23 VITALS — BP 142/97 | HR 95 | Temp 98.6°F | Wt 163.0 lb

## 2016-11-23 DIAGNOSIS — K754 Autoimmune hepatitis: Secondary | ICD-10-CM

## 2016-11-23 DIAGNOSIS — Z942 Lung transplant status: Secondary | ICD-10-CM | POA: Diagnosis not present

## 2016-11-23 DIAGNOSIS — J0110 Acute frontal sinusitis, unspecified: Secondary | ICD-10-CM

## 2016-11-23 DIAGNOSIS — N189 Chronic kidney disease, unspecified: Secondary | ICD-10-CM

## 2016-11-23 DIAGNOSIS — D631 Anemia in chronic kidney disease: Secondary | ICD-10-CM | POA: Diagnosis not present

## 2016-11-23 DIAGNOSIS — M545 Low back pain: Secondary | ICD-10-CM

## 2016-11-23 DIAGNOSIS — J069 Acute upper respiratory infection, unspecified: Secondary | ICD-10-CM | POA: Diagnosis not present

## 2016-11-23 DIAGNOSIS — G8929 Other chronic pain: Secondary | ICD-10-CM

## 2016-11-23 DIAGNOSIS — D61818 Other pancytopenia: Secondary | ICD-10-CM | POA: Diagnosis not present

## 2016-11-23 DIAGNOSIS — J329 Chronic sinusitis, unspecified: Secondary | ICD-10-CM

## 2016-11-23 LAB — CBC WITH DIFFERENTIAL (CANCER CENTER ONLY)
BASO#: 0 10*3/uL (ref 0.0–0.2)
BASO%: 0.4 % (ref 0.0–2.0)
EOS ABS: 0.1 10*3/uL (ref 0.0–0.5)
EOS%: 4 % (ref 0.0–7.0)
HCT: 27.9 % — ABNORMAL LOW (ref 34.8–46.6)
HGB: 8.8 g/dL — ABNORMAL LOW (ref 11.6–15.9)
LYMPH#: 0.8 10*3/uL — ABNORMAL LOW (ref 0.9–3.3)
LYMPH%: 27.5 % (ref 14.0–48.0)
MCH: 30.6 pg (ref 26.0–34.0)
MCHC: 31.5 g/dL — AB (ref 32.0–36.0)
MCV: 97 fL (ref 81–101)
MONO#: 0.3 10*3/uL (ref 0.1–0.9)
MONO%: 10.5 % (ref 0.0–13.0)
NEUT#: 1.6 10*3/uL (ref 1.5–6.5)
NEUT%: 57.6 % (ref 39.6–80.0)
PLATELETS: 116 10*3/uL — AB (ref 145–400)
RBC: 2.88 10*6/uL — ABNORMAL LOW (ref 3.70–5.32)
RDW: 12.9 % (ref 11.1–15.7)
WBC: 2.8 10*3/uL — ABNORMAL LOW (ref 3.9–10.0)

## 2016-11-23 LAB — COMPREHENSIVE METABOLIC PANEL
ALBUMIN: 3.5 g/dL (ref 3.5–5.0)
ALK PHOS: 84 U/L (ref 40–150)
ALT: 9 U/L (ref 0–55)
AST: 16 U/L (ref 5–34)
Anion Gap: 8 mEq/L (ref 3–11)
BUN: 26.1 mg/dL — ABNORMAL HIGH (ref 7.0–26.0)
CO2: 21 meq/L — AB (ref 22–29)
Calcium: 8.2 mg/dL — ABNORMAL LOW (ref 8.4–10.4)
Chloride: 109 mEq/L (ref 98–109)
Creatinine: 1.9 mg/dL — ABNORMAL HIGH (ref 0.6–1.1)
EGFR: 32 mL/min/{1.73_m2} — AB (ref >90)
GLUCOSE: 124 mg/dL (ref 70–140)
POTASSIUM: 4.9 meq/L (ref 3.5–5.1)
SODIUM: 138 meq/L (ref 136–145)
Total Bilirubin: 0.57 mg/dL (ref 0.20–1.20)
Total Protein: 8.3 g/dL (ref 6.4–8.3)

## 2016-11-23 MED ORDER — AZITHROMYCIN 250 MG PO TABS: ORAL_TABLET | ORAL | 0 refills | Status: DC

## 2016-11-23 MED ORDER — DARBEPOETIN ALFA 300 MCG/0.6ML IJ SOSY
300.0000 ug | PREFILLED_SYRINGE | Freq: Once | INTRAMUSCULAR | Status: AC
  Administered 2016-11-23: 300 ug via SUBCUTANEOUS

## 2016-11-23 MED ORDER — DARBEPOETIN ALFA 300 MCG/0.6ML IJ SOSY
PREFILLED_SYRINGE | INTRAMUSCULAR | Status: AC
  Filled 2016-11-23: qty 0.6

## 2016-11-23 MED FILL — AZITHROMYCIN 250 MG TABLET: 250 | 5 days supply | Qty: 6 | Fill #0

## 2016-11-23 NOTE — Progress Notes (Signed)
Hematology and Oncology Follow Up Visit  Leslieanne Cobarrubias [id number] 03-24-1956 61 y.o. 11/23/2016  Principle Diagnosis:  Chronic pancytopenia  Status post liver transplant for autoimmune hepatitis Anemia of chronic kidney disease  Current Therapy:   Aranesp 300 mcg SQ as needed for hemoglobin less than 10    Interim History: Ms. Rosenwald is here today for a follow-up. She is feeling a bit fatigued at this time. She is still battling an upper respiratory infection and would like a z-pack. She has a productive cough with green sputum. Her lung sounds are course throughout, no wheezes or rhonchi. She is doing neb treatments and using her inhaler as needed.  Her SOB with exertion is unchanged.  Hgb is down at 8.8 today. She has not had any Aranesp since October 2017. No episodes of bleeding or bruising.  No fever, chills, n/v, rash, headache, dizziness, chest pain, palpitations, abdominal pain or changes in bowel or bladder habits.  She continues to follow-up with her transplant team annually at [facility] and will see them again in March. Unfortunately, she had to miss her follow-up in December due to the respiratory infection.  No swelling, tenderness, numbness or tingling in her extremities at this time.  No lymphadenopathy found on exam.  She has maintained a good appetite and is staying well hydrated. Her weight is stable.  Medications:  Allergies as of 11/23/2016      Reactions   Iodinated Diagnostic Agents    Ioxaglate    Latex       Medication List       Accurate as of 11/23/16 11:07 AM. Always use your most recent med list.          albuterol 108 (90 Base) MCG/ACT inhaler Commonly known as:  PROVENTIL HFA;VENTOLIN HFA Inhale into the lungs.   alendronate 70 MG tablet Commonly known as:  FOSAMAX Take 70 mg by mouth once a week.   allopurinol 100 MG tablet Commonly known as:  ZYLOPRIM Take 100 mg by mouth daily.   amLODipine 5 MG tablet Commonly known as:   NORVASC Take 5 mg by mouth daily.   azithromycin 250 MG tablet Commonly known as:  ZITHROMAX Z-PAK Take as directed on package.   calcitonin (salmon) 200 UNIT/ACT nasal spray Commonly known as:  MIACALCIN/FORTICAL Place 1 spray into alternate nostrils daily.   CALCIUM 1200 PO Take by mouth 2 (two) times daily.   gabapentin 300 MG capsule Commonly known as:  NEURONTIN Take 1 capsule (300 mg total) by mouth 2 (two) times daily.   hydrochlorothiazide 12.5 MG tablet Commonly known as:  HYDRODIURIL Take 2 tablets (25 mg total) by mouth daily.   metoprolol succinate 50 MG 24 hr tablet Commonly known as:  TOPROL-XL Take 50 mg by mouth daily.   multivitamin with minerals tablet Take 1 tablet by mouth daily.   NEORAL 25 MG capsule Generic drug:  cycloSPORINE modified Take 25 mg by mouth 2 (two) times daily. Takes 3 tabs in the morning and 2 tabs at bedtime.   ondansetron 4 MG disintegrating tablet Commonly known as:  ZOFRAN-ODT Take 4 mg by mouth every 8 (eight) hours as needed for nausea or vomiting.   oxyCODONE 15 MG immediate release tablet Commonly known as:  ROXICODONE Take 1 tablet (15 mg total) by mouth every 6 (six) hours as needed.   promethazine 12.5 MG tablet Commonly known as:  PHENERGAN TAKE 1 TABLET (12.5 MG TOTAL) BY MOUTH EVERY 6 (SIX) HOURS AS NEEDED.  Vitamin C 250 MG Chew Chew by mouth every morning.   Vitamin D (Ergocalciferol) 50000 units Caps capsule Commonly known as:  DRISDOL Take 50,000 Units by mouth every 14 (fourteen) days.   VOLTAREN 1 % Gel Generic drug:  diclofenac sodium Note:Apply 4 grams to painful knees, up to 4 times daily, as needed      Allergies:  Allergies  Allergen Reactions  . Iodinated Diagnostic Agents   . Ioxaglate   . Latex     Past Medical History, Surgical history, Social history, and Family History were reviewed and updated.  Review of Systems: All other 10 point review of systems is negative.   Physical  Exam:  weight is 163 lb (73.9 kg). Her oral temperature is 98.6 F (37 C). Her blood pressure is 142/97 (abnormal) and her pulse is 95.   Wt Readings from Last 3 Encounters:  11/23/16 163 lb (73.9 kg)  10/22/16 157 lb (71.2 kg)  09/22/16 157 lb 6.4 oz (71.4 kg)    Ocular: Sclerae unicteric, pupils equal, round and reactive to light Ear-nose-throat: Oropharynx clear, dentition fair Lymphatic: No cervical supraclavicular or axillary adenopathy Lungs no rales or rhonchi, good excursion bilaterally Heart regular rate and rhythm, no murmur appreciated Abd soft, nontender, positive bowel sounds, no liver or spleen tip palpated on exam, no fluid wave MSK no focal spinal tenderness, no joint edema Neuro: non-focal, well-oriented, appropriate affect Breasts: Deferred  Lab Results  Component Value Date   WBC 2.8 (L) 11/23/2016   HGB 8.8 (L) 11/23/2016   HCT 27.9 (L) 11/23/2016   MCV 97 11/23/2016   PLT 116 (L) 11/23/2016   Lab Results  Component Value Date   FERRITIN 510 (H) 09/22/2016   IRON 172 (H) 09/22/2016   TIBC 191 (L) 09/22/2016   UIBC 20 (L) 09/22/2016   IRONPCTSAT 90 (H) 09/22/2016   Lab Results  Component Value Date   RETICCTPCT 1.2 09/29/2015   RBC 2.88 (L) 11/23/2016   RETICCTABS 38.0 09/29/2015   No results found for: KPAFRELGTCHN, LAMBDASER, KAPLAMBRATIO No results found for: Kandis Cocking, IGMSERUM Lab Results  Component Value Date   TOTALPROTELP 9.4 (H) 11/19/2008   ALBUMINELP 41.1 (L) 11/19/2008   A1GS 4.0 11/19/2008   A2GS 7.1 11/19/2008   BETS 4.0 (L) 11/19/2008   BETA2SER 7.1 (H) 11/19/2008   GAMS 36.7 (H) 11/19/2008   MSPIKE NOT DET 11/19/2008   SPEI * 11/19/2008     Chemistry      Component Value Date/Time   NA 137 09/22/2016 0936   K 4.8 09/22/2016 0936   CL 107 12/02/2015 1150   CO2 20 (L) 09/22/2016 0936   BUN 31.8 (H) 09/22/2016 0936   CREATININE 1.8 (H) 09/22/2016 0936      Component Value Date/Time   CALCIUM 8.9 09/22/2016 0936    ALKPHOS 99 09/22/2016 0936   AST 16 09/22/2016 0936   ALT 9 09/22/2016 0936   BILITOT 0.42 09/22/2016 0936     Impression and Plan: Ms. Dragos is 61 yo African American female with anemia of chronic kidney disease. She has responded nicely to Aranesp and has not had an injection in 5 months. Hgb today is down a bit at 8.8. She will get her injection today.  She has another repiratoy infection and I have sent a prescription for a Z-pack to the pharmacy downstairs for her to pick up on her way out. She will contact us if this does not improve on antibiotics.   She also has  a history of liver transplant secondary to autoimmune hepatitis. So far, there has been no evidence of rejection. She continues to follow up with her transplant team at [facility] annually and will see them again on March 5th. .   We will plan to see her back in 6 weeks for repeat lab work and follow-up. She will contact our office with any questions or concerns. We can certainly see her back sooner if need be.   Eliezer Bottom, NP  1/31/201811:07 AM

## 2016-11-23 NOTE — Patient Instructions (Signed)

## 2016-12-26 MED ORDER — NEORAL 25 MG CAPSULE
ORAL_CAPSULE | Freq: Two times a day (BID) | ORAL | 0 refills | 0 days
Start: 2016-12-26 — End: 2018-01-15

## 2017-01-06 ENCOUNTER — Ambulatory Visit: Payer: Medicaid Other

## 2017-01-06 ENCOUNTER — Other Ambulatory Visit: Payer: Medicaid Other

## 2017-01-06 ENCOUNTER — Ambulatory Visit: Payer: Medicaid Other | Admitting: Family

## 2017-01-09 ENCOUNTER — Ambulatory Visit (HOSPITAL_BASED_OUTPATIENT_CLINIC_OR_DEPARTMENT_OTHER): Payer: Medicaid Other | Admitting: Family

## 2017-01-09 ENCOUNTER — Other Ambulatory Visit (HOSPITAL_BASED_OUTPATIENT_CLINIC_OR_DEPARTMENT_OTHER): Payer: Medicaid Other

## 2017-01-09 ENCOUNTER — Ambulatory Visit: Payer: Medicaid Other

## 2017-01-09 VITALS — BP 139/85 | HR 80 | Temp 98.1°F | Resp 18 | Ht 62.0 in | Wt 152.8 lb

## 2017-01-09 DIAGNOSIS — D61818 Other pancytopenia: Secondary | ICD-10-CM

## 2017-01-09 DIAGNOSIS — N189 Chronic kidney disease, unspecified: Secondary | ICD-10-CM | POA: Diagnosis not present

## 2017-01-09 DIAGNOSIS — K754 Autoimmune hepatitis: Secondary | ICD-10-CM | POA: Diagnosis not present

## 2017-01-09 DIAGNOSIS — D631 Anemia in chronic kidney disease: Secondary | ICD-10-CM | POA: Diagnosis not present

## 2017-01-09 DIAGNOSIS — G4701 Insomnia due to medical condition: Secondary | ICD-10-CM

## 2017-01-09 DIAGNOSIS — R110 Nausea: Secondary | ICD-10-CM

## 2017-01-09 LAB — CBC WITH DIFFERENTIAL (CANCER CENTER ONLY)
BASO#: 0 10*3/uL (ref 0.0–0.2)
BASO%: 0.3 % (ref 0.0–2.0)
EOS%: 1.8 % (ref 0.0–7.0)
Eosinophils Absolute: 0.1 10*3/uL (ref 0.0–0.5)
HCT: 34.2 % — ABNORMAL LOW (ref 34.8–46.6)
HEMOGLOBIN: 11.6 g/dL (ref 11.6–15.9)
LYMPH#: 0.9 10*3/uL (ref 0.9–3.3)
LYMPH%: 26.2 % (ref 14.0–48.0)
MCH: 31 pg (ref 26.0–34.0)
MCHC: 33.9 g/dL (ref 32.0–36.0)
MCV: 91 fL (ref 81–101)
MONO#: 0.3 10*3/uL (ref 0.1–0.9)
MONO%: 8.8 % (ref 0.0–13.0)
NEUT%: 62.9 % (ref 39.6–80.0)
NEUTROS ABS: 2.1 10*3/uL (ref 1.5–6.5)
Platelets: 152 10*3/uL (ref 145–400)
RBC: 3.74 10*6/uL (ref 3.70–5.32)
RDW: 13.2 % (ref 11.1–15.7)
WBC: 3.3 10*3/uL — AB (ref 3.9–10.0)

## 2017-01-09 LAB — COMPREHENSIVE METABOLIC PANEL
ALT: 16 U/L (ref 0–55)
AST: 22 U/L (ref 5–34)
Albumin: 3.7 g/dL (ref 3.5–5.0)
Alkaline Phosphatase: 107 U/L (ref 40–150)
Anion Gap: 11 mEq/L (ref 3–11)
BUN: 27.1 mg/dL — AB (ref 7.0–26.0)
CHLORIDE: 109 meq/L (ref 98–109)
CO2: 18 meq/L — AB (ref 22–29)
CREATININE: 1.9 mg/dL — AB (ref 0.6–1.1)
Calcium: 9.5 mg/dL (ref 8.4–10.4)
EGFR: 32 mL/min/{1.73_m2} — ABNORMAL LOW (ref >90)
Glucose: 155 mg/dl — ABNORMAL HIGH (ref 70–140)
POTASSIUM: 5 meq/L (ref 3.5–5.1)
SODIUM: 138 meq/L (ref 136–145)
Total Bilirubin: 0.58 mg/dL (ref 0.20–1.20)
Total Protein: 9 g/dL — ABNORMAL HIGH (ref 6.4–8.3)

## 2017-01-09 MED ORDER — PROMETHAZINE HCL 12.5 MG PO TABS: 12.5000 mg | ORAL_TABLET | Freq: Four times a day (QID) | ORAL | 3 refills | Status: DC | PRN

## 2017-01-09 MED ORDER — PROMETHAZINE HCL 12.5 MG PO TABS: 12.5000 mg | ORAL_TABLET | Freq: Four times a day (QID) | ORAL | 1 refills | Status: DC | PRN

## 2017-01-09 NOTE — Progress Notes (Signed)
Hematology and Oncology Follow Up Visit  Tami [name] [id number] 05/11/1956 61 y.o. 01/09/2017  Principle Diagnosis:  Chronic pancytopenia  Status post liver transplant for autoimmune hepatitis Anemia of chronic kidney disease  Current Therapy:   Aranesp 300 mcg SQ as needed for hemoglobin less than 10    Interim History: Tami [name] is here today for a follow-up. She is doing fairly well but having some sinus congestion with occasional dizziness if she bends over and sits up too fast.  No falls or syncopal episodes. No fatigue. No fever, chills, n/v, rash, headache, chest pain, palpitations, abdominal pain or changes in bowel or bladder habits.  Her SOB with exertion is unchanged. She will take breaks to rest as needed.  She continues to follow-up with her transplant team annually at [facility] and just saw them last week. She got a good report and will see them again in a year.  No episodes of bleeding, bruising or petechiae. No lymphadenopathy found on exam.  No swelling, tenderness, numbness or tingling in her extremities at this time.  No lymphadenopathy found on exam.  She has maintained a good appetite and is staying well hydrated. Her weight is stable.  Medications:  Allergies as of 01/09/2017      Reactions   Iodinated Diagnostic Agents    Ioxaglate    Latex       Medication List       Accurate as of 01/09/17 11:18 AM. Always use your most recent med list.          albuterol 108 (90 Base) MCG/ACT inhaler Commonly known as:  PROVENTIL HFA;VENTOLIN HFA Inhale into the lungs.   alendronate 70 MG tablet Commonly known as:  FOSAMAX Take 70 mg by mouth once a week.   allopurinol 100 MG tablet Commonly known as:  ZYLOPRIM Take 100 mg by mouth daily.   amLODipine 5 MG tablet Commonly known as:  NORVASC Take 5 mg by mouth daily.   azithromycin 250 MG tablet Commonly known as:  ZITHROMAX Z-PAK Take as directed on package.   calcitonin (salmon) 200 UNIT/ACT  nasal spray Commonly known as:  MIACALCIN/FORTICAL Place 1 spray into alternate nostrils daily.   CALCIUM 1200 PO Take by mouth 2 (two) times daily.   gabapentin 300 MG capsule Commonly known as:  NEURONTIN Take 1 capsule (300 mg total) by mouth 2 (two) times daily.   hydrochlorothiazide 12.5 MG tablet Commonly known as:  HYDRODIURIL Take 2 tablets (25 mg total) by mouth daily.   metoprolol succinate 50 MG 24 hr tablet Commonly known as:  TOPROL-XL Take 50 mg by mouth daily.   multivitamin with minerals tablet Take 1 tablet by mouth daily.   NEORAL 25 MG capsule Generic drug:  cycloSPORINE modified Take 25 mg by mouth 2 (two) times daily. Takes 3 tabs in the morning and 2 tabs at bedtime.   ondansetron 4 MG disintegrating tablet Commonly known as:  ZOFRAN-ODT Take 4 mg by mouth every 8 (eight) hours as needed for nausea or vomiting.   oxyCODONE 15 MG immediate release tablet Commonly known as:  ROXICODONE Take 1 tablet (15 mg total) by mouth every 6 (six) hours as needed.   promethazine 12.5 MG tablet Commonly known as:  PHENERGAN TAKE 1 TABLET (12.5 MG TOTAL) BY MOUTH EVERY 6 (SIX) HOURS AS NEEDED.   Vitamin C 250 MG Chew Chew by mouth every morning.   Vitamin D (Ergocalciferol) 50000 units Caps capsule Commonly known as:  DRISDOL Take 50,000  Units by mouth every 14 (fourteen) days.   VOLTAREN 1 % Gel Generic drug:  diclofenac sodium Note:Apply 4 grams to painful knees, up to 4 times daily, as needed      Allergies:  Allergies  Allergen Reactions  . Iodinated Diagnostic Agents   . Ioxaglate   . Latex     Past Medical History, Surgical history, Social history, and Family History were reviewed and updated.  Review of Systems: All other 10 point review of systems is negative.   Physical Exam:  height is 5\' 2"  (1.575 m) and weight is 152 lb 12.8 oz (69.3 kg). Her oral temperature is 98.1 F (36.7 C). Her blood pressure is 139/85 and her pulse is 80. Her  respiration is 18 and oxygen saturation is 100%.   Wt Readings from Last 3 Encounters:  01/09/17 152 lb 12.8 oz (69.3 kg)  11/23/16 163 lb (73.9 kg)  10/22/16 157 lb (71.2 kg)    Ocular: Sclerae unicteric, pupils equal, round and reactive to light Ear-nose-throat: Oropharynx clear, dentition fair Lymphatic: No cervical, supraclavicular or axillary adenopathy Lungs no rales or rhonchi, good excursion bilaterally Heart regular rate and rhythm, no murmur appreciated Abd soft, nontender, positive bowel sounds, no liver or spleen tip palpated on exam, no fluid wave MSK no focal spinal tenderness, no joint edema Neuro: non-focal, well-oriented, appropriate affect Breasts: Deferred  Lab Results  Component Value Date   WBC 3.3 (L) 01/09/2017   HGB 11.6 01/09/2017   HCT 34.2 (L) 01/09/2017   MCV 91 01/09/2017   PLT 152 01/09/2017   Lab Results  Component Value Date   FERRITIN 510 (H) 09/22/2016   IRON 172 (H) 09/22/2016   TIBC 191 (L) 09/22/2016   UIBC 20 (L) 09/22/2016   IRONPCTSAT 90 (H) 09/22/2016   Lab Results  Component Value Date   RETICCTPCT 1.2 09/29/2015   RBC 3.74 01/09/2017   RETICCTABS 38.0 09/29/2015   No results found for: KPAFRELGTCHN, LAMBDASER, KAPLAMBRATIO No results found for: Kandis Cocking, IGMSERUM Lab Results  Component Value Date   TOTALPROTELP 9.4 (H) 11/19/2008   ALBUMINELP 41.1 (L) 11/19/2008   A1GS 4.0 11/19/2008   A2GS 7.1 11/19/2008   BETS 4.0 (L) 11/19/2008   BETA2SER 7.1 (H) 11/19/2008   GAMS 36.7 (H) 11/19/2008   MSPIKE NOT DET 11/19/2008   SPEI * 11/19/2008     Chemistry      Component Value Date/Time   NA 138 11/23/2016 1045   K 4.9 11/23/2016 1045   CL 107 12/02/2015 1150   CO2 21 (L) 11/23/2016 1045   BUN 26.1 (H) 11/23/2016 1045   CREATININE 1.9 (H) 11/23/2016 1045      Component Value Date/Time   CALCIUM 8.2 (L) 11/23/2016 1045   ALKPHOS 84 11/23/2016 1045   AST 16 11/23/2016 1045   ALT 9 11/23/2016 1045   BILITOT 0.57  11/23/2016 1045     Impression and Plan: Tami [name] is 61 yo African American female with anemia of chronic kidney disease. She has responded nicely to Aranesp. Hgb today is 11.6 so no injection needed at this visit.  Of note, she has a history of liver transplant secondary to autoimmune hepatitis. So far, there has been no evidence of rejection. She continues to follow up with her transplant team at [facility] annually. Appointment last week went well and she got a good report.  Zofran refill script sent to [facility].  We will plan to see her back in 3 months for repeat  lab work and follow-up. She will contact our office with any questions or concerns. We can certainly see her back sooner if need be.   Eliezer Bottom, NP  3/19/201811:18 AM

## 2017-01-10 LAB — ERYTHROPOIETIN: Erythropoietin: 3.2 m[IU]/mL (ref 2.6–18.5)

## 2017-02-08 MED ORDER — NEORAL 25 MG CAPSULE
ORAL_CAPSULE | Freq: Two times a day (BID) | ORAL | 0 refills | 0.00000 days
Start: 2017-02-08 — End: 2018-01-15

## 2017-02-14 MED ORDER — NEORAL 25 MG CAPSULE
ORAL_CAPSULE | Freq: Two times a day (BID) | ORAL | 0 refills | 0.00000 days
Start: 2017-02-14 — End: 2018-01-15

## 2017-04-11 ENCOUNTER — Ambulatory Visit: Payer: Medicaid Other

## 2017-04-11 ENCOUNTER — Other Ambulatory Visit (HOSPITAL_BASED_OUTPATIENT_CLINIC_OR_DEPARTMENT_OTHER): Payer: Medicaid Other

## 2017-04-11 ENCOUNTER — Ambulatory Visit (HOSPITAL_BASED_OUTPATIENT_CLINIC_OR_DEPARTMENT_OTHER): Payer: Medicaid Other | Admitting: Family

## 2017-04-11 ENCOUNTER — Encounter: Payer: Self-pay | Admitting: *Deleted

## 2017-04-11 VITALS — BP 146/87 | HR 71 | Temp 98.5°F | Resp 19 | Wt 155.0 lb

## 2017-04-11 DIAGNOSIS — N189 Chronic kidney disease, unspecified: Secondary | ICD-10-CM

## 2017-04-11 DIAGNOSIS — G4701 Insomnia due to medical condition: Secondary | ICD-10-CM

## 2017-04-11 DIAGNOSIS — R110 Nausea: Secondary | ICD-10-CM

## 2017-04-11 DIAGNOSIS — D631 Anemia in chronic kidney disease: Secondary | ICD-10-CM

## 2017-04-11 DIAGNOSIS — K754 Autoimmune hepatitis: Secondary | ICD-10-CM

## 2017-04-11 DIAGNOSIS — D61818 Other pancytopenia: Secondary | ICD-10-CM

## 2017-04-11 LAB — CBC WITH DIFFERENTIAL (CANCER CENTER ONLY)
BASO#: 0 10*3/uL (ref 0.0–0.2)
BASO%: 0 % (ref 0.0–2.0)
EOS%: 2.3 % (ref 0.0–7.0)
Eosinophils Absolute: 0.1 10*3/uL (ref 0.0–0.5)
HCT: 29.6 % — ABNORMAL LOW (ref 34.8–46.6)
HGB: 10.2 g/dL — ABNORMAL LOW (ref 11.6–15.9)
LYMPH#: 0.8 10*3/uL — AB (ref 0.9–3.3)
LYMPH%: 25.2 % (ref 14.0–48.0)
MCH: 32.3 pg (ref 26.0–34.0)
MCHC: 34.5 g/dL (ref 32.0–36.0)
MCV: 94 fL (ref 81–101)
MONO#: 0.4 10*3/uL (ref 0.1–0.9)
MONO%: 11.7 % (ref 0.0–13.0)
NEUT%: 60.8 % (ref 39.6–80.0)
NEUTROS ABS: 1.9 10*3/uL (ref 1.5–6.5)
PLATELETS: 130 10*3/uL — AB (ref 145–400)
RBC: 3.16 10*6/uL — ABNORMAL LOW (ref 3.70–5.32)
RDW: 12.2 % (ref 11.1–15.7)
WBC: 3.1 10*3/uL — AB (ref 3.9–10.0)

## 2017-04-11 LAB — COMPREHENSIVE METABOLIC PANEL
ALK PHOS: 80 U/L (ref 40–150)
ALT: 8 U/L (ref 0–55)
AST: 16 U/L (ref 5–34)
Albumin: 3.7 g/dL (ref 3.5–5.0)
Anion Gap: 9 mEq/L (ref 3–11)
BUN: 33.4 mg/dL — ABNORMAL HIGH (ref 7.0–26.0)
CO2: 19 meq/L — AB (ref 22–29)
Calcium: 9 mg/dL (ref 8.4–10.4)
Chloride: 110 mEq/L — ABNORMAL HIGH (ref 98–109)
Creatinine: 1.8 mg/dL — ABNORMAL HIGH (ref 0.6–1.1)
EGFR: 35 mL/min/{1.73_m2} — AB (ref >90)
GLUCOSE: 105 mg/dL (ref 70–140)
SODIUM: 138 meq/L (ref 136–145)
Total Bilirubin: 0.54 mg/dL (ref 0.20–1.20)
Total Protein: 8.8 g/dL — ABNORMAL HIGH (ref 6.4–8.3)

## 2017-04-11 NOTE — Progress Notes (Signed)
Hematology and Oncology Follow Up Visit  Tami [name] [id number] 1956/04/13 61 y.o. 04/11/2017   Principle Diagnosis:  Chronic pancytopenia  Status post liver transplant for autoimmune hepatitis Anemia of chronic kidney disease  Current Therapy:   Aranesp 300 mcg SQ as needed for hemoglobin less than 10    Interim History:  Tami [name] is here today for follow-up. She is doing fairly well but still having dizziness and nausea (no vomiting) if she leans forward. She states that zofran has not helped the nausea. She will try dramamine and see if this helps with these episodes.  Her PCP has retired and she is currently searching for a new provider that accepts Medicaid. She plans to contact medicaid directly for a list of providers in her network.  She denies fatigue. Hgb is stable at 10.2.  She has had no episodes of bleeding, bruising or petechiae. No lymphadenopathy found on exam.  No fever, chills, cough, rash, SOB, chest pain, palpitations, abdominal pain or changes in bowel or bladder habits.  No swelling, tenderness, numbness or tingling in her extremities. No c/o pain at this time.  She has maintained a good appetite and is staying well hydrated.  She is following up with her team at [facility] annually and will see them again in March 2019.   ECOG Performance Status: 0 - Asymptomatic  Medications:  Allergies as of 04/11/2017      Reactions   Iodinated Diagnostic Agents    Ioxaglate    Latex       Medication List       Accurate as of 04/11/17 10:45 AM. Always use your most recent med list.          albuterol 108 (90 Base) MCG/ACT inhaler Commonly known as:  PROVENTIL HFA;VENTOLIN HFA Inhale into the lungs.   alendronate 70 MG tablet Commonly known as:  FOSAMAX Take 70 mg by mouth once a week.   allopurinol 100 MG tablet Commonly known as:  ZYLOPRIM Take 100 mg by mouth daily.   amLODipine 5 MG tablet Commonly known as:  NORVASC Take 5 mg by mouth daily.     azithromycin 250 MG tablet Commonly known as:  ZITHROMAX Z-PAK Take as directed on package.   calcitonin (salmon) 200 UNIT/ACT nasal spray Commonly known as:  MIACALCIN/FORTICAL Place 1 spray into alternate nostrils daily.   CALCIUM 1200 PO Take by mouth 2 (two) times daily.   gabapentin 300 MG capsule Commonly known as:  NEURONTIN Take 1 capsule (300 mg total) by mouth 2 (two) times daily.   hydrochlorothiazide 12.5 MG tablet Commonly known as:  HYDRODIURIL Take 2 tablets (25 mg total) by mouth daily.   metoprolol succinate 50 MG 24 hr tablet Commonly known as:  TOPROL-XL Take 50 mg by mouth daily.   multivitamin with minerals tablet Take 1 tablet by mouth daily.   NEORAL 25 MG capsule Generic drug:  cycloSPORINE modified Take 25 mg by mouth 2 (two) times daily. Takes 3 tabs in the morning and 2 tabs at bedtime.   ondansetron 4 MG disintegrating tablet Commonly known as:  ZOFRAN-ODT Take 4 mg by mouth every 8 (eight) hours as needed for nausea or vomiting.   Oxycodone HCl 10 MG Tabs Take 10 mg by mouth as needed.   promethazine 12.5 MG tablet Commonly known as:  PHENERGAN Take 1 tablet (12.5 mg total) by mouth every 6 (six) hours as needed.   Vitamin C 250 MG Chew Chew by mouth every morning.  Vitamin D (Ergocalciferol) 50000 units Caps capsule Commonly known as:  DRISDOL Take 50,000 Units by mouth every 14 (fourteen) days.   VOLTAREN 1 % Gel Generic drug:  diclofenac sodium Note:Apply 4 grams to painful knees, up to 4 times daily, as needed       Allergies:  Allergies  Allergen Reactions  . Iodinated Diagnostic Agents   . Ioxaglate   . Latex     Past Medical History, Surgical history, Social history, and Family History were reviewed and updated.  Review of Systems: All other 10 point review of systems is negative.   Physical Exam:  weight is 155 lb (70.3 kg). Her oral temperature is 98.5 F (36.9 C). Her blood pressure is 146/87 (abnormal)  and her pulse is 71. Her respiration is 19 and oxygen saturation is 100%.   Wt Readings from Last 3 Encounters:  04/11/17 155 lb (70.3 kg)  01/09/17 152 lb 12.8 oz (69.3 kg)  11/23/16 163 lb (73.9 kg)    Ocular: Sclerae unicteric, pupils equal, round and reactive to light Ear-nose-throat: Oropharynx clear, dentition fair Lymphatic: No cervical, supraclavicular or axillary adenopathy Lungs no rales or rhonchi, good excursion bilaterally Heart regular rate and rhythm, no murmur appreciated Abd soft, nontender, positive bowel sounds, no liver or spleen tip palpated on exam, no fluid wave MSK no focal spinal tenderness, no joint edema Neuro: non-focal, well-oriented, appropriate affect Breasts: Deferred   Lab Results  Component Value Date   WBC 3.1 (L) 04/11/2017   HGB 10.2 (L) 04/11/2017   HCT 29.6 (L) 04/11/2017   MCV 94 04/11/2017   PLT 130 (L) 04/11/2017   Lab Results  Component Value Date   FERRITIN 510 (H) 09/22/2016   IRON 172 (H) 09/22/2016   TIBC 191 (L) 09/22/2016   UIBC 20 (L) 09/22/2016   IRONPCTSAT 90 (H) 09/22/2016   Lab Results  Component Value Date   RETICCTPCT 1.2 09/29/2015   RBC 3.16 (L) 04/11/2017   RETICCTABS 38.0 09/29/2015   No results found for: KPAFRELGTCHN, LAMBDASER, KAPLAMBRATIO No results found for: Kandis Cocking, IGMSERUM Lab Results  Component Value Date   TOTALPROTELP 9.4 (H) 11/19/2008   ALBUMINELP 41.1 (L) 11/19/2008   A1GS 4.0 11/19/2008   A2GS 7.1 11/19/2008   BETS 4.0 (L) 11/19/2008   BETA2SER 7.1 (H) 11/19/2008   GAMS 36.7 (H) 11/19/2008   MSPIKE NOT DET 11/19/2008   SPEI * 11/19/2008     Chemistry      Component Value Date/Time   NA 138 01/09/2017 1040   K 5.0 01/09/2017 1040   CL 107 12/02/2015 1150   CO2 18 (L) 01/09/2017 1040   BUN 27.1 (H) 01/09/2017 1040   CREATININE 1.9 (H) 01/09/2017 1040      Component Value Date/Time   CALCIUM 9.5 01/09/2017 1040   ALKPHOS 107 01/09/2017 1040   AST 22 01/09/2017 1040   ALT  16 01/09/2017 1040   BILITOT 0.58 01/09/2017 1040      Impression and Plan: Tami [name] is a very pleasant 61 yo African American female with anemia of chronic kidney disease. She last received Aranesp in January and has had a nice response. Hgb is stable at 10.2 so no injection needed at this visit.  She follows up with her transplant team at [facility] annually and will them again in March 2019.  She still has occasional dizziness and nausea and will try taking dramamine for this.  We will plan to see her back in another 3 months  for repeat lab work and follow-up.  She will contact our office with any questions or concerns. We can certainly see her sooner if need be.   Eliezer Bottom, NP 6/19/201810:45 AM

## 2017-05-12 NOTE — Unmapped (Signed)
Standing lab orders for [facility] entered into EPIC.

## 2017-05-15 MED ORDER — NEORAL 25 MG CAPSULE
ORAL_CAPSULE | Freq: Two times a day (BID) | ORAL | 3 refills | 0.00000 days | Status: CP
Start: 2017-05-15 — End: 2018-01-15

## 2017-05-15 NOTE — Unmapped (Signed)
Confirmed with patient to decrease Neoral to 50 mg BID per NP Daphine Deutscher patient agreed to repeat labs in one week.

## 2017-05-15 NOTE — Unmapped (Signed)
Received notification from [facility] that patient notes she has been taking neoral 75.mg in the morning and 50.mg in the evening instead of prescribed 50.mg BID that was scripted @ annual visit.  Clarified with Ermalinda Barrios, NP that patient should be taking 50.mg BID and escripted to [facility] pharmacy - primary coordinator to notify patient of ordered dosing change.

## 2017-05-16 MED FILL — NEORAL/25MG/CAP: NEORAL/25MG/CAP | 30 days supply | Qty: 120 | Fill #3

## 2017-05-16 NOTE — Unmapped (Signed)
[facility] Refill and Clinical Coordination Note  Medication(s): neoral 25mg     Katherine Norton, DOB: [birth year]  Phone: [phone] (home) , Alternate phone contact: N/A  Shipping address: [address]  [place] [address]  Phone or address changes today?: No  All above HIPAA information verified.  Insurance changes? No    Completed refill and clinical call assessment today to schedule patient's medication shipment from the [facility] [phone]).      MEDICATION RECONCILIATION    Confirmed the medication and dosage are correct and have not changed: Yes, regimen is correct and unchanged.    Were there any changes to your medication(s) in the past month:  No, there are no changes reported at this time.    ADHERENCE    Is this medicine transplant or covered by Medicare Part B? Yes.    Neoral 25 mg   Quantity filled last month: 120   # of tablets left on hand: 40      Did you miss any doses in the past 4 weeks? No missed doses reported.  Adherence counseling provided? Not needed     SIDE EFFECT MANAGEMENT    Are you tolerating your medication?:  Aleese reports tolerating the medication.  Side effect management discussed: None      Therapy is appropriate and should be continued.    Evidence of clinical benefit: See Epic note from 12/26/16      FINANCIAL/SHIPPING    Delivery Scheduled: Yes, Expected medication delivery date: 05/17/17   Additional medications refilled: No additional medications/refills needed at this time.    Janeshia did not have any additional questions at this time.    Delivery address validated in [facility] scheduling system: Yes, address listed above is correct.      We will follow up with patient monthly for standard refill processing and delivery.      Thank you,  Mickle Mallory   [facility] Specialty Pharmacist

## 2017-06-08 NOTE — Unmapped (Signed)
[facility] Specialty Pharmacy Refill Coordination Note  Specialty Medication(s): NEORAL 25MG   Additional Medications shipped: NO    Katherine Norton, DOB: 1955-11-30  Phone: [phone] (home) , Alternate phone contact: N/A  Phone or address changes today?: No  All above HIPAA information was verified with patient.  Shipping Address: [address]  [place] [address]   Insurance changes? No    Completed refill call assessment today to schedule patient's medication shipment from the [facility] [phone]).      Confirmed the medication and dosage are correct and have not changed: Yes, regimen is correct and unchanged.    Confirmed patient started or stopped the following medications in the past month:  No, there are no changes reported at this time.    Are you tolerating your medication?:  Katherine Norton reports tolerating the medication.    ADHERENCE    (Below is required for Medicare Part B or Transplant patients only - per drug):   How many tablets were dispensed last month: 120  Patient currently has 60 remaining.    Did you miss any doses in the past 4 weeks? No missed doses reported.    FINANCIAL/SHIPPING    Delivery Scheduled: Yes, Expected medication delivery date: 06/15/17     Katherine Norton did not have any additional questions at this time.    Delivery address validated in FSI scheduling system: Yes, address listed in FSI is correct.    We will follow up with patient monthly for standard refill processing and delivery.      Thank you,  Katherine Norton   [facility] Specialty Pharmacist

## 2017-06-13 MED FILL — NEORAL/25MG/CAP: NEORAL/25MG/CAP | 30 days supply | Qty: 120 | Fill #4

## 2017-06-24 LAB — COMPREHENSIVE METABOLIC PANEL
ALBUMIN: 4 g/dL (ref 3.6–4.8)
ALKALINE PHOSPHATASE: 78 IU/L (ref 39–117)
ALT (SGPT): 10 IU/L (ref 0–32)
AST (SGOT): 19 IU/L (ref 0–40)
BILIRUBIN TOTAL: 0.4 mg/dL (ref 0.0–1.2)
BLOOD UREA NITROGEN: 22 mg/dL (ref 8–27)
CALCIUM: 8 mg/dL — ABNORMAL LOW (ref 8.7–10.3)
CHLORIDE: 106 mmol/L (ref 96–106)
CO2: 18 mmol/L — ABNORMAL LOW (ref 20–29)
CREATININE: 1.56 mg/dL — ABNORMAL HIGH (ref 0.57–1.00)
GLOBULIN, TOTAL: 4 g/dL (ref 1.5–4.5)
GLUCOSE: 142 mg/dL — ABNORMAL HIGH (ref 65–99)
SODIUM: 139 mmol/L (ref 134–144)
TOTAL PROTEIN: 8 g/dL (ref 6.0–8.5)

## 2017-06-24 LAB — CBC W/ DIFFERENTIAL
BANDED NEUTROPHILS ABSOLUTE COUNT: 0 10*3/uL (ref 0.0–0.1)
BASOPHILS ABSOLUTE COUNT: 0 10*3/uL (ref 0.0–0.2)
BASOPHILS RELATIVE PERCENT: 0 %
EOSINOPHILS ABSOLUTE COUNT: 0.1 10*3/uL (ref 0.0–0.4)
EOSINOPHILS RELATIVE PERCENT: 2 %
HEMATOCRIT: 26.7 % — ABNORMAL LOW (ref 34.0–46.6)
HEMOGLOBIN: 9.3 g/dL — ABNORMAL LOW (ref 11.1–15.9)
IMMATURE GRANULOCYTES: 0 %
LYMPHOCYTES ABSOLUTE COUNT: 0.7 10*3/uL (ref 0.7–3.1)
LYMPHOCYTES RELATIVE PERCENT: 26 %
MEAN CORPUSCULAR HEMOGLOBIN CONC: 34.8 g/dL (ref 31.5–35.7)
MEAN CORPUSCULAR VOLUME: 90 fL (ref 79–97)
NEUTROPHILS ABSOLUTE COUNT: 1.9 10*3/uL (ref 1.4–7.0)
NEUTROPHILS RELATIVE PERCENT: 61 %
PLATELET COUNT: 75 10*3/uL — CL (ref 150–379)
RED BLOOD CELL COUNT: 2.97 x10E6/uL — ABNORMAL LOW (ref 3.77–5.28)
RED CELL DISTRIBUTION WIDTH: 14.1 % (ref 12.3–15.4)
WHITE BLOOD CELL COUNT: 3.1 10*3/uL — ABNORMAL LOW (ref 3.4–10.8)

## 2017-06-24 LAB — BILIRUBIN DIRECT: Lab: 0.13

## 2017-06-24 LAB — GLUCOSE: Lab: 142 — ABNORMAL HIGH

## 2017-06-24 LAB — GAMMA GLUTAMYL TRANSFERASE: Lab: 11

## 2017-06-24 LAB — LYMPHOCYTES ABSOLUTE COUNT: Lab: 0.7

## 2017-06-24 LAB — MAGNESIUM: Lab: 1.2 — ABNORMAL LOW

## 2017-06-24 LAB — PHOSPHORUS, SERUM: Lab: 3.5

## 2017-06-25 LAB — CYCLOSPORINE, LC-MS/MS: Lab: 59 — ABNORMAL LOW

## 2017-06-29 NOTE — Unmapped (Signed)
Called patient to see if any new medications have been started per NP Daphine Deutscher due to Platelet decreasing so significantly.  Spoke with patient and she denied any medication starts.  Saw PCP about dizziness and thinks it is vertigo.  Patient agreed to repeat labs in one week.

## 2017-07-05 NOTE — Unmapped (Signed)
[facility] Specialty Pharmacy Refill Coordination Note  Specialty Medication(s): NEORAL 25  Additional Medications shipped:     Katherine Norton, DOB: 01-05-1956  Phone: [phone] (home) , Alternate phone contact: N/A  Phone or address changes today?: No  All above HIPAA information was verified with patient.  Shipping Address: [address]  [place] [address]   Insurance changes? No    Completed refill call assessment today to schedule patient's medication shipment from the [facility] [phone]).      Confirmed the medication and dosage are correct and have not changed: Yes, regimen is correct and unchanged.    Confirmed patient started or stopped the following medications in the past month:  No, there are no changes reported at this time.    Are you tolerating your medication?:  Katherine Norton reports tolerating the medication.    ADHERENCE    Neoral 25 mg   Quantity filled last month: 120   # of tablets left on hand: 40    Did you miss any doses in the past 4 weeks? No missed doses reported.    FINANCIAL/SHIPPING    Delivery Scheduled: Yes, Expected medication delivery date: 07/11/17     Maegan did not have any additional questions at this time.    Delivery address validated in FSI scheduling system: Yes, address listed in FSI is correct.    We will follow up with patient monthly for standard refill processing and delivery.      Thank you,  Mickle Mallory   [facility] Shared [facility] Specialty Pharmacist

## 2017-07-08 MED FILL — NEORAL/25MG/CAP: NEORAL/25MG/CAP | 30 days supply | Qty: 120 | Fill #5

## 2017-07-14 ENCOUNTER — Other Ambulatory Visit (HOSPITAL_BASED_OUTPATIENT_CLINIC_OR_DEPARTMENT_OTHER): Payer: Medicaid Other

## 2017-07-14 ENCOUNTER — Ambulatory Visit (HOSPITAL_BASED_OUTPATIENT_CLINIC_OR_DEPARTMENT_OTHER): Payer: Medicaid Other | Admitting: Hematology & Oncology

## 2017-07-14 ENCOUNTER — Ambulatory Visit: Payer: Medicaid Other

## 2017-07-14 VITALS — BP 157/96 | HR 87 | Temp 98.5°F | Resp 17 | Wt 159.0 lb

## 2017-07-14 DIAGNOSIS — N189 Chronic kidney disease, unspecified: Secondary | ICD-10-CM | POA: Diagnosis not present

## 2017-07-14 DIAGNOSIS — J329 Chronic sinusitis, unspecified: Secondary | ICD-10-CM

## 2017-07-14 DIAGNOSIS — Z944 Liver transplant status: Secondary | ICD-10-CM

## 2017-07-14 DIAGNOSIS — D61818 Other pancytopenia: Secondary | ICD-10-CM

## 2017-07-14 DIAGNOSIS — D631 Anemia in chronic kidney disease: Secondary | ICD-10-CM | POA: Diagnosis not present

## 2017-07-14 DIAGNOSIS — K754 Autoimmune hepatitis: Secondary | ICD-10-CM | POA: Diagnosis not present

## 2017-07-14 DIAGNOSIS — K469 Unspecified abdominal hernia without obstruction or gangrene: Secondary | ICD-10-CM

## 2017-07-14 LAB — CBC WITH DIFFERENTIAL (CANCER CENTER ONLY)
BASO#: 0 10*3/uL (ref 0.0–0.2)
BASO%: 0 % (ref 0.0–2.0)
EOS ABS: 0.1 10*3/uL (ref 0.0–0.5)
EOS%: 2.5 % (ref 0.0–7.0)
HCT: 31.2 % — ABNORMAL LOW (ref 34.8–46.6)
HGB: 10.6 g/dL — ABNORMAL LOW (ref 11.6–15.9)
LYMPH#: 0.8 10*3/uL — AB (ref 0.9–3.3)
LYMPH%: 26.2 % (ref 14.0–48.0)
MCH: 31.8 pg (ref 26.0–34.0)
MCHC: 34 g/dL (ref 32.0–36.0)
MCV: 94 fL (ref 81–101)
MONO#: 0.4 10*3/uL (ref 0.1–0.9)
MONO%: 12.3 % (ref 0.0–13.0)
NEUT#: 1.9 10*3/uL (ref 1.5–6.5)
NEUT%: 59 % (ref 39.6–80.0)
PLATELETS: 147 10*3/uL (ref 145–400)
RBC: 3.33 10*6/uL — AB (ref 3.70–5.32)
RDW: 12.4 % (ref 11.1–15.7)
WBC: 3.2 10*3/uL — ABNORMAL LOW (ref 3.9–10.0)

## 2017-07-14 LAB — CMP (CANCER CENTER ONLY)
ALK PHOS: 77 U/L (ref 26–84)
ALT: 18 U/L (ref 10–47)
AST: 26 U/L (ref 11–38)
Albumin: 3.5 g/dL (ref 3.3–5.5)
BILIRUBIN TOTAL: 0.7 mg/dL (ref 0.20–1.60)
BUN, Bld: 20 mg/dL (ref 7–22)
CO2: 23 mEq/L (ref 18–33)
CREATININE: 1.6 mg/dL — AB (ref 0.6–1.2)
Calcium: 8.6 mg/dL (ref 8.0–10.3)
Chloride: 108 mEq/L (ref 98–108)
Glucose, Bld: 155 mg/dL — ABNORMAL HIGH (ref 73–118)
POTASSIUM: 4.6 meq/L (ref 3.3–4.7)
Sodium: 143 mEq/L (ref 128–145)
TOTAL PROTEIN: 9 g/dL — AB (ref 6.4–8.1)

## 2017-07-14 MED ORDER — AZITHROMYCIN 250 MG PO TABS: ORAL_TABLET | ORAL | 3 refills | Status: DC

## 2017-07-14 NOTE — Progress Notes (Signed)
Hematology and Oncology Follow Up Visit  Tami [name] [id number] [birth year] 61 y.o. 07/14/2017   Principle Diagnosis:  Chronic pancytopenia  Status post liver transplant for autoimmune hepatitis Anemia of chronic kidney disease  Current Therapy:   Aranesp 300 mcg SQ as needed for hemoglobin less than 10    Interim History:  Tami [name] is here today for follow-up. She is doing pretty well. She's had a good summer. It has been pretty quiet.  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 She went down to [facility]. They said that there is nothing that they can do to fix this.  She has had no problems with weakness. There is no fatigue.  She does have problems with recurrent sinusitis. It sounds like she may have another bout. We went ahead and called in some Zithromax.  She's had no bleeding. His been no change in bowel or bladder habits. She's had no nausea or vomiting.  His been no leg swelling.  Overall, her performance status is ECOG 0.  Medications:  Allergies as of 07/14/2017      Reactions   Iodinated Diagnostic Agents    Ioxaglate    Latex       Medication List       Accurate as of 07/14/17 11:13 AM. Always use your most recent med list.          albuterol 108 (90 Base) MCG/ACT inhaler Commonly known as:  PROVENTIL HFA;VENTOLIN HFA Inhale into the lungs.   alendronate 70 MG tablet Commonly known as:  FOSAMAX Take 70 mg by mouth once a week.   allopurinol 100 MG tablet Commonly known as:  ZYLOPRIM Take 100 mg by mouth daily.   amLODipine 5 MG tablet Commonly known as:  NORVASC Take 5 mg by mouth daily.   azithromycin 250 MG tablet Commonly known as:  ZITHROMAX Z-PAK Take as directed on package.   calcitonin (salmon) 200 UNIT/ACT nasal spray Commonly known as:  MIACALCIN/FORTICAL Place 1 spray into alternate nostrils daily.   CALCIUM 1200 PO Take by mouth 2 (two) times daily.   gabapentin 300 MG capsule Commonly known as:  NEURONTIN Take  1 capsule (300 mg total) by mouth 2 (two) times daily.   hydrochlorothiazide 12.5 MG tablet Commonly known as:  HYDRODIURIL Take 2 tablets (25 mg total) by mouth daily.   metoprolol succinate 50 MG 24 hr tablet Commonly known as:  TOPROL-XL Take 50 mg by mouth daily.   multivitamin with minerals tablet Take 1 tablet by mouth daily.   NEORAL 25 MG capsule Generic drug:  cycloSPORINE modified Take 25 mg by mouth 2 (two) times daily. Takes 3 tabs in the morning and 2 tabs at bedtime.   ondansetron 4 MG disintegrating tablet Commonly known as:  ZOFRAN-ODT Take 4 mg by mouth every 8 (eight) hours as needed for nausea or vomiting.   Oxycodone HCl 10 MG Tabs Take 10 mg by mouth as needed.   promethazine 12.5 MG tablet Commonly known as:  PHENERGAN Take 1 tablet (12.5 mg total) by mouth every 6 (six) hours as needed.   Vitamin C 250 MG Chew Chew by mouth every morning.   Vitamin D (Ergocalciferol) 50000 units Caps capsule Commonly known as:  DRISDOL Take 50,000 Units by mouth every 14 (fourteen) days.   VOLTAREN 1 % Gel Generic drug:  diclofenac sodium Note:Apply 4 grams to painful knees, up to 4 times daily, as needed  Discharge Care Instructions        Start     Ordered   07/14/17 0000  azithromycin (ZITHROMAX Z-PAK) 250 MG tablet     07/14/17 1112   07/14/17 0000  CBC with Differential ([facility])     07/14/17 1113   07/14/17 0000  CMP STAT ([place] only)     07/14/17 1113   07/14/17 0000  Ferritin     07/14/17 1113   07/14/17 0000  Reticulocytes     07/14/17 1113   07/14/17 0000  Iron and TIBC     07/14/17 1113      Allergies:  Allergies  Allergen Reactions  . Iodinated Diagnostic Agents   . Ioxaglate   . Latex     Past Medical History, Surgical history, Social history, and Family History were reviewed and updated.  Review of Systems: As stated in the interim history  Physical Exam:  weight is 159 lb (72.1 kg).  Her oral temperature is 98.5 F (36.9 C). Her blood pressure is 157/96 (abnormal) and her pulse is 87. Her respiration is 17 and oxygen saturation is 98%.   Wt Readings from Last 3 Encounters:  07/14/17 159 lb (72.1 kg)  04/11/17 155 lb (70.3 kg)  01/09/17 152 lb 12.8 oz (69.3 kg)    Physical Exam  Constitutional: She is oriented to person, place, and time.  HENT:  Head: Normocephalic and atraumatic.  Mouth/Throat: Oropharynx is clear and moist.  Eyes: Pupils are equal, round, and reactive to light. EOM are normal.  Neck: Normal range of motion.  Cardiovascular: Normal rate, regular rhythm and normal heart sounds.   Pulmonary/Chest: Effort normal and breath sounds normal.  Abdominal: Soft. Bowel sounds are normal.  Musculoskeletal: Normal range of motion. She exhibits no edema, tenderness or deformity.  Lymphadenopathy:    She has no cervical adenopathy.  Neurological: She is alert and oriented to person, place, and time.  Skin: Skin is warm and dry. No rash noted. No erythema.  Psychiatric: She has a normal mood and affect. Her behavior is normal. Judgment and thought content normal.  Vitals reviewed.     Lab Results  Component Value Date   WBC 3.2 (L) 07/14/2017   HGB 10.6 (L) 07/14/2017   HCT 31.2 (L) 07/14/2017   MCV 94 07/14/2017   PLT 147 07/14/2017   Lab Results  Component Value Date   FERRITIN 510 (H) 09/22/2016   IRON 172 (H) 09/22/2016   TIBC 191 (L) 09/22/2016   UIBC 20 (L) 09/22/2016   IRONPCTSAT 90 (H) 09/22/2016   Lab Results  Component Value Date   RETICCTPCT 1.2 09/29/2015   RBC 3.33 (L) 07/14/2017   RETICCTABS 38.0 09/29/2015   No results found for: KPAFRELGTCHN, LAMBDASER, KAPLAMBRATIO No results found for: IGGSERUM, Regino Bellow, IGMSERUM Lab Results  Component Value Date   TOTALPROTELP 9.4 (H) 11/19/2008   ALBUMINELP 41.1 (L) 11/19/2008   A1GS 4.0 11/19/2008   A2GS 7.1 11/19/2008   BETS 4.0 (L) 11/19/2008   BETA2SER 7.1 (H) 11/19/2008   GAMS  36.7 (H) 11/19/2008   MSPIKE NOT DET 11/19/2008   SPEI * 11/19/2008     Chemistry      Component Value Date/Time   NA 143 07/14/2017 1005   NA 138 04/11/2017 1023   K 4.6 07/14/2017 1005   K 5.2 No visable hemolysis (H) 04/11/2017 1023   CL 108 07/14/2017 1005   CO2 23 07/14/2017 1005   CO2 19 (L) 04/11/2017 1023  BUN 20 07/14/2017 1005   BUN 33.4 (H) 04/11/2017 1023   CREATININE 1.6 (H) 07/14/2017 1005   CREATININE 1.8 (H) 04/11/2017 1023      Component Value Date/Time   CALCIUM 8.6 07/14/2017 1005   CALCIUM 9.0 04/11/2017 1023   ALKPHOS 77 07/14/2017 1005   ALKPHOS 80 04/11/2017 1023   AST 26 07/14/2017 1005   AST 16 04/11/2017 1023   ALT 18 07/14/2017 1005   ALT 8 04/11/2017 1023   BILITOT 0.70 07/14/2017 1005   BILITOT 0.54 04/11/2017 1023      Impression and Plan: Tami [name] is a very pleasant 61 yo African American female with anemia of chronic kidney disease. She last received Aranesp in January and has had a nice response.  Today, we do not have to give her any Aranesp. I'm very happy about this.  I am just very impressed that her daughter is serving our country over in [place].  I will like to see her back in 3 more months.   [name], MD 9/21/201811:13 AM

## 2017-07-15 LAB — CBC W/ DIFFERENTIAL
BANDED NEUTROPHILS ABSOLUTE COUNT: 0 10*3/uL (ref 0.0–0.1)
BASOPHILS ABSOLUTE COUNT: 0 10*3/uL (ref 0.0–0.2)
BASOPHILS RELATIVE PERCENT: 0 %
EOSINOPHILS ABSOLUTE COUNT: 0.1 10*3/uL (ref 0.0–0.4)
EOSINOPHILS RELATIVE PERCENT: 3 %
HEMATOCRIT: 29.5 % — ABNORMAL LOW (ref 34.0–46.6)
HEMOGLOBIN: 9.9 g/dL — ABNORMAL LOW (ref 11.1–15.9)
IMMATURE GRANULOCYTES: 1 %
LYMPHOCYTES ABSOLUTE COUNT: 0.9 10*3/uL (ref 0.7–3.1)
LYMPHOCYTES RELATIVE PERCENT: 26 %
MEAN CORPUSCULAR HEMOGLOBIN CONC: 33.6 g/dL (ref 31.5–35.7)
MEAN CORPUSCULAR HEMOGLOBIN: 30.4 pg (ref 26.6–33.0)
MEAN CORPUSCULAR VOLUME: 91 fL (ref 79–97)
MONOCYTES ABSOLUTE COUNT: 0.4 10*3/uL (ref 0.1–0.9)
MONOCYTES RELATIVE PERCENT: 13 %
NEUTROPHILS ABSOLUTE COUNT: 1.9 10*3/uL (ref 1.4–7.0)
NEUTROPHILS RELATIVE PERCENT: 57 %
PLATELET COUNT: 137 10*3/uL — ABNORMAL LOW (ref 150–379)
RED BLOOD CELL COUNT: 3.26 x10E6/uL — ABNORMAL LOW (ref 3.77–5.28)
RED CELL DISTRIBUTION WIDTH: 13.5 % (ref 12.3–15.4)
WHITE BLOOD CELL COUNT: 3.3 10*3/uL — ABNORMAL LOW (ref 3.4–10.8)

## 2017-07-15 LAB — COMPREHENSIVE METABOLIC PANEL
ALBUMIN: 4 g/dL (ref 3.6–4.8)
ALKALINE PHOSPHATASE: 82 IU/L (ref 39–117)
ALT (SGPT): 9 IU/L (ref 0–32)
AST (SGOT): 19 IU/L (ref 0–40)
BILIRUBIN TOTAL: 0.4 mg/dL (ref 0.0–1.2)
BLOOD UREA NITROGEN: 21 mg/dL (ref 8–27)
BUN / CREAT RATIO: 13 (ref 12–28)
CALCIUM: 8.5 mg/dL — ABNORMAL LOW (ref 8.7–10.3)
CHLORIDE: 102 mmol/L (ref 96–106)
CO2: 20 mmol/L (ref 20–29)
GLOBULIN, TOTAL: 4.5 g/dL (ref 1.5–4.5)
GLUCOSE: 139 mg/dL — ABNORMAL HIGH (ref 65–99)
POTASSIUM: 4.6 mmol/L (ref 3.5–5.2)
SODIUM: 138 mmol/L (ref 134–144)

## 2017-07-15 LAB — PHOSPHORUS, SERUM: Lab: 3.4

## 2017-07-15 LAB — MAGNESIUM: Lab: 1.1 — ABNORMAL LOW

## 2017-07-15 LAB — GAMMA GLUTAMYL TRANSFERASE: Lab: 13

## 2017-07-15 LAB — BUN / CREAT RATIO: Lab: 13

## 2017-07-15 LAB — BILIRUBIN DIRECT: Lab: 0.16

## 2017-07-15 LAB — BASOPHILS RELATIVE PERCENT: Lab: 0

## 2017-07-17 LAB — CYCLOSPORINE, LC-MS/MS: Lab: 98 — ABNORMAL LOW

## 2017-07-31 NOTE — Unmapped (Signed)
error 

## 2017-08-03 NOTE — Unmapped (Signed)
[facility] Specialty Pharmacy Refill Coordination Note  Specialty Medication(s): NEORAL  Additional Medications shipped:     Katherine Norton, DOB: Jan 02, 1956  Phone: [phone] (home) , Alternate phone contact: N/A  Phone or address changes today?: No  All above HIPAA information was verified with patient.  Shipping Address: [address]  [place] [address]   Insurance changes? No    Completed refill call assessment today to schedule patient's medication shipment from the [facility] [phone]).      Confirmed the medication and dosage are correct and have not changed: Yes, regimen is correct and unchanged.    Confirmed patient started or stopped the following medications in the past month:  No, there are no changes reported at this time.    Are you tolerating your medication?:  Katherine Norton reports tolerating the medication.    ADHERENCE    (Below is required for Medicare Part B or Transplant patients only - per drug):   How many tablets were dispensed last month: 120  Patient currently has 30 remaining.    Did you miss any doses in the past 4 weeks? No missed doses reported.    FINANCIAL/SHIPPING    Delivery Scheduled: Yes, Expected medication delivery date: 08/09/17     Katherine Norton did not have any additional questions at this time.    Delivery address validated in FSI scheduling system: Yes, address listed in FSI is correct.    We will follow up with patient monthly for standard refill processing and delivery.      Thank you,  Mitzi Davenport   [facility] Specialty Technician

## 2017-08-08 MED FILL — NEORAL/25MG/CAP: NEORAL/25MG/CAP | 30 days supply | Qty: 120 | Fill #6

## 2017-08-29 NOTE — Unmapped (Signed)
[facility] Specialty Pharmacy Refill Coordination Note  Specialty Medication(s): Neoral 25mg     Aryonna Gunnerson, DOB: 12-21-1955  Phone: [phone] (home) , Alternate phone contact: N/A  Phone or address changes today?: No  All above HIPAA information was verified with patient.  Shipping Address: [address]  [place] [address]   Insurance changes? No    Completed refill call assessment today to schedule patient's medication shipment from the [facility] [phone]).      Confirmed the medication and dosage are correct and have not changed: Yes, regimen is correct and unchanged.    Confirmed patient started or stopped the following medications in the past month:  No, there are no changes reported at this time.    Are you tolerating your medication?:  Leiyah reports tolerating the medication.    ADHERENCE    (Below is required for Medicare Part B or Transplant patients only - per drug):   How many tablets were dispensed last month:   Neoral 25 mg   Quantity filled last month: 120   # of tablets left on hand: 30    Did you miss any doses in the past 4 weeks? No missed doses reported.    FINANCIAL/SHIPPING    Delivery Scheduled: Yes, Expected medication delivery date: 09/07/2017     Caelynn did not have any additional questions at this time.    Delivery address validated in FSI scheduling system: Yes, address listed in FSI is correct.    We will follow up with patient monthly for standard refill processing and delivery.      Thank you,  Tamala Fothergill   [facility] Shared [facility] Specialty Technician

## 2017-09-05 MED FILL — NEORAL/25MG/CAP: NEORAL/25MG/CAP | 30 days supply | Qty: 120 | Fill #7

## 2017-09-12 NOTE — Unmapped (Signed)
Called patient [facility] in [facility] without putting in new orders and they found lab orders for patient.  Spoke with patient and let her know she confirmed she will come tomorrow.  Per patient request placed new order and sent copy of it to her via mail.

## 2017-09-28 NOTE — Unmapped (Signed)
[facility] Specialty Pharmacy Refill Coordination Note  Specialty Medication(s): Neoral 25mg     Katherine Norton, DOB: 07/20/1956  Phone: [phone] (home) , Alternate phone contact: N/A  Phone or address changes today?: No  All above HIPAA information was verified with patient.  Shipping Address: [address]  [place] [address]   Insurance changes? No    Completed refill call assessment today to schedule patient's medication shipment from the [facility] [phone]).      Confirmed the medication and dosage are correct and have not changed: Yes, regimen is correct and unchanged.    Confirmed patient started or stopped the following medications in the past month:  No, there are no changes reported at this time.    Are you tolerating your medication?:  Katherine Norton reports tolerating the medication.    ADHERENCE    (Below is required for Medicare Part B or Transplant patients only - per drug):   How many tablets were dispensed last month:     Neoral 25 mg   Quantity filled last month: 120   # of tablets left on hand: 10 days    Did you miss any doses in the past 4 weeks? No missed doses reported.    FINANCIAL/SHIPPING    Delivery Scheduled: Yes, Expected medication delivery date: 10/05/2017     Katherine Norton did not have any additional questions at this time.    Delivery address validated in FSI scheduling system: Yes, address listed in FSI is correct.    We will follow up with patient monthly for standard refill processing and delivery.      Thank you,  Tamala Fothergill   [facility] Shared [facility] Specialty Technician

## 2017-10-04 MED FILL — NEORAL/25MG/CAP: NEORAL/25MG/CAP | 30 days supply | Qty: 120 | Fill #8

## 2017-10-13 ENCOUNTER — Other Ambulatory Visit (HOSPITAL_BASED_OUTPATIENT_CLINIC_OR_DEPARTMENT_OTHER): Payer: Medicaid Other

## 2017-10-13 ENCOUNTER — Telehealth: Payer: Self-pay | Admitting: Family

## 2017-10-13 ENCOUNTER — Encounter (HOSPITAL_COMMUNITY): Payer: Self-pay | Admitting: Emergency Medicine

## 2017-10-13 ENCOUNTER — Inpatient Hospital Stay (HOSPITAL_COMMUNITY)
Admission: EM | Admit: 2017-10-13 | Discharge: 2017-10-16 | DRG: 683 | Disposition: A | Discharge status: Home / Self Care | Payer: Medicaid Other | Attending: Nephrology | Admitting: Nephrology

## 2017-10-13 ENCOUNTER — Ambulatory Visit (HOSPITAL_BASED_OUTPATIENT_CLINIC_OR_DEPARTMENT_OTHER): Payer: Medicaid Other | Admitting: Family

## 2017-10-13 ENCOUNTER — Other Ambulatory Visit (HOSPITAL_COMMUNITY): Payer: Self-pay

## 2017-10-13 ENCOUNTER — Other Ambulatory Visit: Payer: Self-pay

## 2017-10-13 ENCOUNTER — Inpatient Hospital Stay (HOSPITAL_COMMUNITY): Payer: Medicaid Other

## 2017-10-13 ENCOUNTER — Emergency Department (HOSPITAL_COMMUNITY): Payer: Medicaid Other

## 2017-10-13 ENCOUNTER — Telehealth: Payer: Self-pay | Admitting: *Deleted

## 2017-10-13 ENCOUNTER — Encounter: Payer: Self-pay | Admitting: Family

## 2017-10-13 ENCOUNTER — Ambulatory Visit (HOSPITAL_BASED_OUTPATIENT_CLINIC_OR_DEPARTMENT_OTHER): Payer: Medicaid Other

## 2017-10-13 VITALS — BP 132/57 | HR 76 | Temp 98.3°F | Resp 20 | Wt 167.0 lb

## 2017-10-13 DIAGNOSIS — N179 Acute kidney failure, unspecified: Secondary | ICD-10-CM | POA: Diagnosis present

## 2017-10-13 DIAGNOSIS — R944 Abnormal results of kidney function studies: Secondary | ICD-10-CM | POA: Diagnosis present

## 2017-10-13 DIAGNOSIS — R319 Hematuria, unspecified: Secondary | ICD-10-CM | POA: Diagnosis present

## 2017-10-13 DIAGNOSIS — R809 Proteinuria, unspecified: Secondary | ICD-10-CM | POA: Diagnosis present

## 2017-10-13 DIAGNOSIS — Z91040 Latex allergy status: Secondary | ICD-10-CM

## 2017-10-13 DIAGNOSIS — Z944 Liver transplant status: Secondary | ICD-10-CM | POA: Diagnosis not present

## 2017-10-13 DIAGNOSIS — Z79899 Other long term (current) drug therapy: Secondary | ICD-10-CM | POA: Diagnosis not present

## 2017-10-13 DIAGNOSIS — T502X5A Adverse effect of carbonic-anhydrase inhibitors, benzothiadiazides and other diuretics, initial encounter: Secondary | ICD-10-CM | POA: Diagnosis present

## 2017-10-13 DIAGNOSIS — D61818 Other pancytopenia: Secondary | ICD-10-CM | POA: Diagnosis present

## 2017-10-13 DIAGNOSIS — T451X5A Adverse effect of antineoplastic and immunosuppressive drugs, initial encounter: Secondary | ICD-10-CM | POA: Diagnosis present

## 2017-10-13 DIAGNOSIS — E875 Hyperkalemia: Secondary | ICD-10-CM

## 2017-10-13 DIAGNOSIS — N189 Chronic kidney disease, unspecified: Secondary | ICD-10-CM

## 2017-10-13 DIAGNOSIS — D631 Anemia in chronic kidney disease: Secondary | ICD-10-CM

## 2017-10-13 DIAGNOSIS — K7469 Other cirrhosis of liver: Secondary | ICD-10-CM | POA: Diagnosis present

## 2017-10-13 DIAGNOSIS — K754 Autoimmune hepatitis: Secondary | ICD-10-CM | POA: Diagnosis not present

## 2017-10-13 DIAGNOSIS — M1990 Unspecified osteoarthritis, unspecified site: Secondary | ICD-10-CM | POA: Diagnosis present

## 2017-10-13 DIAGNOSIS — E785 Hyperlipidemia, unspecified: Secondary | ICD-10-CM | POA: Diagnosis present

## 2017-10-13 DIAGNOSIS — I129 Hypertensive chronic kidney disease with stage 1 through stage 4 chronic kidney disease, or unspecified chronic kidney disease: Secondary | ICD-10-CM | POA: Diagnosis present

## 2017-10-13 DIAGNOSIS — N183 Chronic kidney disease, stage 3 (moderate): Secondary | ICD-10-CM | POA: Diagnosis present

## 2017-10-13 DIAGNOSIS — E861 Hypovolemia: Secondary | ICD-10-CM | POA: Diagnosis present

## 2017-10-13 DIAGNOSIS — Z91041 Radiographic dye allergy status: Secondary | ICD-10-CM

## 2017-10-13 DIAGNOSIS — N261 Atrophy of kidney (terminal): Secondary | ICD-10-CM

## 2017-10-13 DIAGNOSIS — J329 Chronic sinusitis, unspecified: Secondary | ICD-10-CM

## 2017-10-13 DIAGNOSIS — I10 Essential (primary) hypertension: Secondary | ICD-10-CM | POA: Diagnosis not present

## 2017-10-13 DIAGNOSIS — Z7983 Long term (current) use of bisphosphonates: Secondary | ICD-10-CM | POA: Diagnosis not present

## 2017-10-13 LAB — URINALYSIS, ROUTINE W REFLEX MICROSCOPIC
Bilirubin Urine: NEGATIVE
GLUCOSE, UA: NEGATIVE mg/dL
HGB URINE DIPSTICK: NEGATIVE
KETONES UR: NEGATIVE mg/dL
NITRITE: NEGATIVE
PROTEIN: NEGATIVE mg/dL
Specific Gravity, Urine: 1.008 (ref 1.005–1.030)
pH: 5 (ref 5.0–8.0)

## 2017-10-13 LAB — CMP (CANCER CENTER ONLY)
ALBUMIN: 3.3 g/dL (ref 3.3–5.5)
ALT(SGPT): 15 U/L (ref 10–47)
AST: 18 U/L (ref 11–38)
Alkaline Phosphatase: 68 U/L (ref 26–84)
BUN, Bld: 58 mg/dL — ABNORMAL HIGH (ref 7–22)
CALCIUM: 8.4 mg/dL (ref 8.0–10.3)
CHLORIDE: 104 meq/L (ref 98–108)
CO2: 17 mEq/L — ABNORMAL LOW (ref 18–33)
Creat: 5.9 mg/dl (ref 0.6–1.2)
Glucose, Bld: 116 mg/dL (ref 73–118)
Sodium: 140 mEq/L (ref 128–145)
Total Bilirubin: 0.5 mg/dl (ref 0.20–1.60)
Total Protein: 7.7 g/dL (ref 6.4–8.1)

## 2017-10-13 LAB — CBC WITH DIFFERENTIAL (CANCER CENTER ONLY)
BASO#: 0 10*3/uL (ref 0.0–0.2)
BASO%: 0.2 % (ref 0.0–2.0)
EOS ABS: 0.2 10*3/uL (ref 0.0–0.5)
EOS%: 3.5 % (ref 0.0–7.0)
HEMATOCRIT: 27.5 % — AB (ref 34.8–46.6)
HEMOGLOBIN: 9 g/dL — AB (ref 11.6–15.9)
LYMPH#: 1 10*3/uL (ref 0.9–3.3)
LYMPH%: 23.8 % (ref 14.0–48.0)
MCH: 30.8 pg (ref 26.0–34.0)
MCHC: 32.7 g/dL (ref 32.0–36.0)
MCV: 94 fL (ref 81–101)
MONO#: 0.4 10*3/uL (ref 0.1–0.9)
MONO%: 9.6 % (ref 0.0–13.0)
NEUT%: 62.9 % (ref 39.6–80.0)
NEUTROS ABS: 2.7 10*3/uL (ref 1.5–6.5)
Platelets: 124 10*3/uL — ABNORMAL LOW (ref 145–400)
RBC: 2.92 10*6/uL — ABNORMAL LOW (ref 3.70–5.32)
RDW: 13.2 % (ref 11.1–15.7)
WBC: 4.3 10*3/uL (ref 3.9–10.0)

## 2017-10-13 LAB — CBC WITH DIFFERENTIAL/PLATELET
BASOS PCT: 0 %
Basophils Absolute: 0 10*3/uL (ref 0.0–0.1)
Eosinophils Absolute: 0.1 10*3/uL (ref 0.0–0.7)
Eosinophils Relative: 3 %
HEMATOCRIT: 26.7 % — AB (ref 36.0–46.0)
HEMOGLOBIN: 9 g/dL — AB (ref 12.0–15.0)
LYMPHS PCT: 19 %
Lymphs Abs: 0.7 10*3/uL (ref 0.7–4.0)
MCH: 30.8 pg (ref 26.0–34.0)
MCHC: 33.7 g/dL (ref 30.0–36.0)
MCV: 91.4 fL (ref 78.0–100.0)
MONOS PCT: 12 %
Monocytes Absolute: 0.4 10*3/uL (ref 0.1–1.0)
NEUTROS ABS: 2.4 10*3/uL (ref 1.7–7.7)
NEUTROS PCT: 66 %
Platelets: 105 10*3/uL — ABNORMAL LOW (ref 150–400)
RBC: 2.92 MIL/uL — ABNORMAL LOW (ref 3.87–5.11)
RDW: 13.6 % (ref 11.5–15.5)
WBC: 3.6 10*3/uL — ABNORMAL LOW (ref 4.0–10.5)

## 2017-10-13 LAB — COMPREHENSIVE METABOLIC PANEL
ALBUMIN: 3.5 g/dL (ref 3.5–5.0)
ALK PHOS: 58 U/L (ref 38–126)
ALT: 6 U/L — ABNORMAL LOW (ref 14–54)
ANION GAP: 9 (ref 5–15)
AST: 30 U/L (ref 15–41)
BILIRUBIN TOTAL: 0.6 mg/dL (ref 0.3–1.2)
BUN: 61 mg/dL — AB (ref 6–20)
CALCIUM: 7.9 mg/dL — AB (ref 8.9–10.3)
CO2: 15 mmol/L — ABNORMAL LOW (ref 22–32)
Chloride: 106 mmol/L (ref 101–111)
Creatinine, Ser: 5.68 mg/dL — ABNORMAL HIGH (ref 0.44–1.00)
GFR calc Af Amer: 8 mL/min — ABNORMAL LOW (ref >60)
GFR, EST NON AFRICAN AMERICAN: 7 mL/min — AB (ref >60)
GLUCOSE: 100 mg/dL — AB (ref 65–99)
Potassium: 6.6 mmol/L (ref 3.5–5.1)
Sodium: 130 mmol/L — ABNORMAL LOW (ref 135–145)
TOTAL PROTEIN: 8 g/dL (ref 6.5–8.1)

## 2017-10-13 LAB — SODIUM, URINE, RANDOM: Sodium, Ur: 23 mmol/L

## 2017-10-13 LAB — BASIC METABOLIC PANEL
ANION GAP: 9 (ref 5–15)
BUN: 59 mg/dL — ABNORMAL HIGH (ref 6–20)
CALCIUM: 8.4 mg/dL — AB (ref 8.9–10.3)
CO2: 18 mmol/L — AB (ref 22–32)
CREATININE: 5.29 mg/dL — AB (ref 0.44–1.00)
Chloride: 108 mmol/L (ref 101–111)
GFR, EST AFRICAN AMERICAN: 9 mL/min — AB (ref >60)
GFR, EST NON AFRICAN AMERICAN: 8 mL/min — AB (ref >60)
Glucose, Bld: 103 mg/dL — ABNORMAL HIGH (ref 65–99)
Potassium: 4.7 mmol/L (ref 3.5–5.1)
SODIUM: 135 mmol/L (ref 135–145)

## 2017-10-13 LAB — IRON AND TIBC
%SAT: 52 % (ref 21–57)
Iron: 90 ug/dL (ref 41–142)
TIBC: 174 ug/dL — ABNORMAL LOW (ref 236–444)
UIBC: 84 ug/dL — AB (ref 120–384)

## 2017-10-13 LAB — FERRITIN: FERRITIN: 710 ng/mL — AB (ref 9–269)

## 2017-10-13 MED ORDER — HEPARIN SODIUM (PORCINE) 5000 UNIT/ML IJ SOLN
5000.0000 [IU] | Freq: Three times a day (TID) | INTRAMUSCULAR | Status: DC
  Administered 2017-10-13: 5000 [IU] via SUBCUTANEOUS
  Filled 2017-10-13: qty 1

## 2017-10-13 MED ORDER — CEFUROXIME AXETIL 500 MG PO TABS
500.0000 mg | ORAL_TABLET | Freq: Two times a day (BID) | ORAL | Status: DC
  Administered 2017-10-13: 500 mg via ORAL
  Filled 2017-10-13 (×2): qty 1

## 2017-10-13 MED ORDER — CYCLOSPORINE MODIFIED (NEORAL) 25 MG PO CAPS
50.0000 mg | ORAL_CAPSULE | Freq: Two times a day (BID) | ORAL | Status: DC
  Administered 2017-10-13 – 2017-10-16 (×6): 50 mg via ORAL
  Filled 2017-10-13 (×6): qty 2

## 2017-10-13 MED ORDER — DARBEPOETIN ALFA 300 MCG/0.6ML IJ SOSY
300.0000 ug | PREFILLED_SYRINGE | Freq: Once | INTRAMUSCULAR | Status: AC
  Administered 2017-10-13: 300 ug via SUBCUTANEOUS

## 2017-10-13 MED ORDER — BISACODYL 5 MG PO TBEC: 5.0000 mg | DELAYED_RELEASE_TABLET | Freq: Every day | ORAL | Status: DC | PRN

## 2017-10-13 MED ORDER — SODIUM POLYSTYRENE SULFONATE 15 GM/60ML PO SUSP
15.0000 g | Freq: Once | ORAL | Status: AC
  Administered 2017-10-13: 15 g via ORAL
  Filled 2017-10-13: qty 60

## 2017-10-13 MED ORDER — AMLODIPINE BESYLATE 5 MG PO TABS
5.0000 mg | ORAL_TABLET | Freq: Every day | ORAL | Status: DC
  Administered 2017-10-14 – 2017-10-16 (×3): 5 mg via ORAL
  Filled 2017-10-13 (×3): qty 1

## 2017-10-13 MED ORDER — SODIUM BICARBONATE 8.4 % IV SOLN
50.0000 meq | Freq: Once | INTRAVENOUS | Status: AC
  Administered 2017-10-13: 50 meq via INTRAVENOUS
  Filled 2017-10-13: qty 50

## 2017-10-13 MED ORDER — SODIUM CHLORIDE 0.9 % IV SOLN
INTRAVENOUS | Status: DC
  Administered 2017-10-13: 23:00:00 via INTRAVENOUS

## 2017-10-13 MED ORDER — ONDANSETRON 4 MG PO TBDP: 4.0000 mg | ORAL_TABLET | Freq: Three times a day (TID) | ORAL | Status: DC | PRN

## 2017-10-13 MED ORDER — CALCIUM CARBONATE-VITAMIN D 500-200 MG-UNIT PO TABS
1.0000 | ORAL_TABLET | Freq: Two times a day (BID) | ORAL | Status: DC
Start: 2017-10-13 — End: 2017-10-16
  Administered 2017-10-13 – 2017-10-16 (×6): 1 via ORAL
  Filled 2017-10-13 (×6): qty 1

## 2017-10-13 MED ORDER — ALLOPURINOL 100 MG PO TABS
100.0000 mg | ORAL_TABLET | Freq: Every day | ORAL | Status: DC
  Administered 2017-10-14 – 2017-10-16 (×3): 100 mg via ORAL
  Filled 2017-10-13 (×3): qty 1

## 2017-10-13 MED ORDER — GUAIFENESIN ER 600 MG PO TB12
600.0000 mg | ORAL_TABLET | Freq: Two times a day (BID) | ORAL | Status: DC | PRN
  Administered 2017-10-14 – 2017-10-15 (×2): 600 mg via ORAL
  Filled 2017-10-13 (×2): qty 1

## 2017-10-13 MED ORDER — SENNOSIDES-DOCUSATE SODIUM 8.6-50 MG PO TABS: 1.0000 | ORAL_TABLET | Freq: Every evening | ORAL | Status: DC | PRN

## 2017-10-13 MED ORDER — SODIUM CHLORIDE 0.9% FLUSH
3.0000 mL | Freq: Two times a day (BID) | INTRAVENOUS | Status: DC
  Administered 2017-10-13: 3 mL via INTRAVENOUS

## 2017-10-13 MED ORDER — ALBUTEROL SULFATE (2.5 MG/3ML) 0.083% IN NEBU: 2.5000 mg | INHALATION_SOLUTION | Freq: Four times a day (QID) | RESPIRATORY_TRACT | Status: DC | PRN

## 2017-10-13 MED ORDER — GABAPENTIN 300 MG PO CAPS
300.0000 mg | ORAL_CAPSULE | Freq: Two times a day (BID) | ORAL | Status: DC
  Administered 2017-10-13 – 2017-10-16 (×6): 300 mg via ORAL
  Filled 2017-10-13 (×6): qty 1

## 2017-10-13 MED ORDER — SODIUM CHLORIDE 0.9 % IV SOLN
1.0000 g | Freq: Once | INTRAVENOUS | Status: AC
  Administered 2017-10-13: 1 g via INTRAVENOUS
  Filled 2017-10-13: qty 10

## 2017-10-13 MED ORDER — ADULT MULTIVITAMIN W/MINERALS CH
1.0000 | ORAL_TABLET | Freq: Every day | ORAL | Status: DC
  Administered 2017-10-13 – 2017-10-16 (×4): 1 via ORAL
  Filled 2017-10-13 (×4): qty 1

## 2017-10-13 MED ORDER — ACETAMINOPHEN 325 MG PO TABS: 650.0000 mg | ORAL_TABLET | Freq: Four times a day (QID) | ORAL | Status: DC | PRN

## 2017-10-13 MED ORDER — OXYCODONE HCL 5 MG PO TABS
10.0000 mg | ORAL_TABLET | Freq: Four times a day (QID) | ORAL | Status: DC | PRN
  Administered 2017-10-13 – 2017-10-15 (×4): 10 mg via ORAL
  Filled 2017-10-13 (×4): qty 2

## 2017-10-13 MED ORDER — MAGNESIUM CITRATE PO SOLN: 1.0000 | Freq: Once | ORAL | Status: DC | PRN

## 2017-10-13 MED ORDER — ALBUTEROL SULFATE HFA 108 (90 BASE) MCG/ACT IN AERS: 2.0000 | INHALATION_SPRAY | Freq: Four times a day (QID) | RESPIRATORY_TRACT | Status: DC | PRN

## 2017-10-13 MED ORDER — METOPROLOL SUCCINATE ER 50 MG PO TB24
50.0000 mg | ORAL_TABLET | Freq: Every day | ORAL | Status: DC
  Administered 2017-10-14 – 2017-10-16 (×3): 50 mg via ORAL
  Filled 2017-10-13 (×3): qty 1

## 2017-10-13 MED ORDER — VITAMIN C 500 MG PO TABS
500.0000 mg | ORAL_TABLET | Freq: Every morning | ORAL | Status: DC
  Administered 2017-10-14 – 2017-10-16 (×3): 500 mg via ORAL
  Filled 2017-10-13 (×3): qty 1

## 2017-10-13 MED ORDER — ACETAMINOPHEN 650 MG RE SUPP: 650.0000 mg | Freq: Four times a day (QID) | RECTAL | Status: DC | PRN

## 2017-10-13 MED ORDER — PROMETHAZINE HCL 25 MG PO TABS
12.5000 mg | ORAL_TABLET | Freq: Four times a day (QID) | ORAL | Status: DC | PRN
Start: 2017-10-13 — End: 2017-10-14

## 2017-10-13 MED ORDER — CALCITONIN (SALMON) 200 UNIT/ACT NA SOLN
1.0000 | Freq: Every day | NASAL | Status: DC
  Administered 2017-10-14 – 2017-10-16 (×3): 1 via NASAL
  Filled 2017-10-13: qty 3.7

## 2017-10-13 NOTE — ED Notes (Signed)
Bed assigned @ 2117 rm 1408

## 2017-10-13 NOTE — ED Notes (Signed)
Report given to [facility].

## 2017-10-13 NOTE — ED Provider Notes (Signed)
Medical screening examination/treatment/procedure(s) were conducted as a shared visit with non-physician practitioner(s) and myself.  I personally evaluated the patient during the encounter.   EKG Interpretation None       Patient seen by me along with the physician assistant.  Patient has a history of liver transplant many years ago.  Normally followed at [facility] for this.  Patient noted and physician visit to have significantly elevated kidney function consistent with acute renal failure.  Here potassium was elevated in the 6.6 range.  EKG monitoring shows no evidence of any significant T wave peaking or widening of the QRS.  Suspect that renal function is probably slowly been getting worse.  She had normal renal function are just slight renal insufficiency in September of this year.  Patient will require admission.  Out of precaution and given patient calcium gluconate to protect the heart.  We will give some bicarb.  And will start on Kayexalate.  Hospitalist will admit.  Patient clinically without any complaints.  She is alert heart regular lungs clear abdomen without any significant tenderness.  Blood pressure normal.  Results for orders placed or performed during the hospital encounter of 10/13/17  CBC with Differential  Result Value Ref Range   WBC 3.6 (L) 4.0 - 10.5 K/uL   RBC 2.92 (L) 3.87 - 5.11 MIL/uL   Hemoglobin 9.0 (L) 12.0 - 15.0 g/dL   HCT 26.7 (L) 36.0 - 46.0 %   MCV 91.4 78.0 - 100.0 fL   MCH 30.8 26.0 - 34.0 pg   MCHC 33.7 30.0 - 36.0 g/dL   RDW 13.6 11.5 - 15.5 %   Platelets 105 (L) 150 - 400 K/uL   Neutrophils Relative % 66 %   Neutro Abs 2.4 1.7 - 7.7 K/uL   Lymphocytes Relative 19 %   Lymphs Abs 0.7 0.7 - 4.0 K/uL   Monocytes Relative 12 %   Monocytes Absolute 0.4 0.1 - 1.0 K/uL   Eosinophils Relative 3 %   Eosinophils Absolute 0.1 0.0 - 0.7 K/uL   Basophils Relative 0 %   Basophils Absolute 0.0 0.0 - 0.1 K/uL  Comprehensive metabolic panel  Result  Value Ref Range   Sodium 130 (L) 135 - 145 mmol/L   Potassium 6.6 (HH) 3.5 - 5.1 mmol/L   Chloride 106 101 - 111 mmol/L   CO2 15 (L) 22 - 32 mmol/L   Glucose, Bld 100 (H) 65 - 99 mg/dL   BUN 61 (H) 6 - 20 mg/dL   Creatinine, Ser 5.68 (H) 0.44 - 1.00 mg/dL   Calcium 7.9 (L) 8.9 - 10.3 mg/dL   Total Protein 8.0 6.5 - 8.1 g/dL   Albumin 3.5 3.5 - 5.0 g/dL   AST 30 15 - 41 U/L   ALT 6 (L) 14 - 54 U/L   Alkaline Phosphatase 58 38 - 126 U/L   Total Bilirubin 0.6 0.3 - 1.2 mg/dL   GFR calc non Af Amer 7 (L) >60 mL/min   GFR calc Af Amer 8 (L) >60 mL/min   Anion gap 9 5 - 15  Urinalysis, Routine w reflex microscopic  Result Value Ref Range   Color, Urine YELLOW YELLOW   APPearance HAZY (A) CLEAR   Specific Gravity, Urine 1.008 1.005 - 1.030   pH 5.0 5.0 - 8.0   Glucose, UA NEGATIVE NEGATIVE mg/dL   Hgb urine dipstick NEGATIVE NEGATIVE   Bilirubin Urine NEGATIVE NEGATIVE   Ketones, ur NEGATIVE NEGATIVE mg/dL   Protein, ur NEGATIVE  NEGATIVE mg/dL   Nitrite NEGATIVE NEGATIVE   Leukocytes, UA SMALL (A) NEGATIVE   RBC / HPF 0-5 0 - 5 RBC/hpf   WBC, UA 0-5 0 - 5 WBC/hpf   Bacteria, UA RARE (A) NONE SEEN   Squamous Epithelial / LPF 0-5 (A) NONE SEEN   Mucus PRESENT    Amorphous Crystal PRESENT      CRITICAL CARE Performed by: Fredia Sorrow Total critical care time:  30 minutes Critical care time was exclusive of separately billable procedures and treating other patients. Critical care was necessary to treat or prevent imminent or life-threatening deterioration. Critical care was time spent personally by me on the following activities: development of treatment plan with patient and/or surrogate as well as nursing, discussions with consultants, evaluation of patient's response to treatment, examination of patient, obtaining history from patient or surrogate, ordering and performing treatments and interventions, ordering and review of laboratory studies, ordering and review of  radiographic studies, pulse oximetry and re-evaluation of patient's condition.   Fredia Sorrow, MD 10/13/17 [phone]

## 2017-10-13 NOTE — Unmapped (Signed)
Called by NP at [facility] as patient's current creatinine is >5. She states that patient is mildly symptomatic. I instructed that she could be seen and managed locally as she is 22 years out from her liver transplant.

## 2017-10-13 NOTE — ED Triage Notes (Addendum)
Pt routine visit with hx of liver transplant; sent here for elevated kidney function.  With chart review creatinine 5.9 and potassium 5.3

## 2017-10-13 NOTE — ED Notes (Signed)
ED TO INPATIENT HANDOFF REPORT  Name/Age/Gender Tami [name] 60 y.o. female  Code Status Code Status History    This patient does not have a recorded code status. Please follow your organizational policy for patients in this situation.      Home/SNF/Other Home  Chief Complaint Kidney numbers high  Level of Care/Admitting Diagnosis ED Disposition    ED Disposition Condition Comment   Admit  Hospital Area: [place] [[id number]]  Level of Care: Telemetry [5]  Admit to tele based on following criteria: Other see comments  Comments: Acute renal failure, hyperkalemia  Diagnosis: Acute kidney injury [facility]) [[id number]]  Admitting Physician: Harvie Bridge [[id number]]  Attending Physician: Sherron Monday  Estimated length of stay: past midnight tomorrow  Certification:: I certify this patient will need inpatient services for at least 2 midnights  PT Class (Do Not Modify): Inpatient [101]  PT Acc Code (Do Not Modify): Private [1]       Medical History Past Medical History:  Diagnosis Date  . Arthritis   . Hepatitis, autoimmune ([place]) 12/08/2011  . Hypertension     Allergies Allergies  Allergen Reactions  . Iodinated Diagnostic Agents   . Ioxaglate   . Latex     IV Location/Drains/Wounds Patient Lines/Drains/Airways Status   Active Line/Drains/Airways    Name:   Placement date:   Placement time:   Site:   Days:   Peripheral IV 10/13/17 Right Hand   10/13/17    1558    Hand   less than 1          Labs/Imaging Results for orders placed or performed during the hospital encounter of 10/13/17 (from the past 48 hour(s))  CBC with Differential     Status: Abnormal   Collection Time: 10/13/17  3:59 PM  Result Value Ref Range   WBC 3.6 (L) 4.0 - 10.5 K/uL   RBC 2.92 (L) 3.87 - 5.11 MIL/uL   Hemoglobin 9.0 (L) 12.0 - 15.0 g/dL   HCT 26.7 (L) 36.0 - 46.0 %   MCV 91.4 78.0 - 100.0 fL   MCH 30.8 26.0 - 34.0 pg   MCHC 33.7 30.0 - 36.0 g/dL    RDW 13.6 11.5 - 15.5 %   Platelets 105 (L) 150 - 400 K/uL    Comment: REPEATED TO VERIFY SPECIMEN CHECKED FOR CLOTS PLATELET COUNT CONFIRMED BY SMEAR    Neutrophils Relative % 66 %   Neutro Abs 2.4 1.7 - 7.7 K/uL   Lymphocytes Relative 19 %   Lymphs Abs 0.7 0.7 - 4.0 K/uL   Monocytes Relative 12 %   Monocytes Absolute 0.4 0.1 - 1.0 K/uL   Eosinophils Relative 3 %   Eosinophils Absolute 0.1 0.0 - 0.7 K/uL   Basophils Relative 0 %   Basophils Absolute 0.0 0.0 - 0.1 K/uL  Comprehensive metabolic panel     Status: Abnormal   Collection Time: 10/13/17  3:59 PM  Result Value Ref Range   Sodium 130 (L) 135 - 145 mmol/L   Potassium 6.6 (HH) 3.5 - 5.1 mmol/L    Comment: CRITICAL RESULT CALLED TO, READ BACK BY AND VERIFIED WITH: WEST,S. RN '@[id number]'  ON 12.21.18 BY COHEN,K    Chloride 106 101 - 111 mmol/L   CO2 15 (L) 22 - 32 mmol/L   Glucose, Bld 100 (H) 65 - 99 mg/dL   BUN 61 (H) 6 - 20 mg/dL   Creatinine, Ser 5.68 (H) 0.44 - 1.00 mg/dL   Calcium 7.9 (L) 8.9 -  10.3 mg/dL   Total Protein 8.0 6.5 - 8.1 g/dL   Albumin 3.5 3.5 - 5.0 g/dL   AST 30 15 - 41 U/L   ALT 6 (L) 14 - 54 U/L   Alkaline Phosphatase 58 38 - 126 U/L   Total Bilirubin 0.6 0.3 - 1.2 mg/dL   GFR calc non Af Amer 7 (L) >60 mL/min   GFR calc Af Amer 8 (L) >60 mL/min    Comment: (NOTE) The eGFR has been calculated using the CKD EPI equation. This calculation has not been validated in all clinical situations. eGFR's persistently <60 mL/min signify possible Chronic Kidney Disease.    Anion gap 9 5 - 15  Urinalysis, Routine w reflex microscopic     Status: Abnormal   Collection Time: 10/13/17  4:10 PM  Result Value Ref Range   Color, Urine YELLOW YELLOW   APPearance HAZY (A) CLEAR   Specific Gravity, Urine 1.008 1.005 - 1.030   pH 5.0 5.0 - 8.0   Glucose, UA NEGATIVE NEGATIVE mg/dL   Hgb urine dipstick NEGATIVE NEGATIVE   Bilirubin Urine NEGATIVE NEGATIVE   Ketones, ur NEGATIVE NEGATIVE mg/dL   Protein, ur  NEGATIVE NEGATIVE mg/dL   Nitrite NEGATIVE NEGATIVE   Leukocytes, UA SMALL (A) NEGATIVE   RBC / HPF 0-5 0 - 5 RBC/hpf   WBC, UA 0-5 0 - 5 WBC/hpf   Bacteria, UA RARE (A) NONE SEEN   Squamous Epithelial / LPF 0-5 (A) NONE SEEN   Mucus PRESENT    Amorphous Crystal PRESENT    Dg Chest 2 View  Result Date: 10/13/2017 CLINICAL DATA:  Cough. EXAM: CHEST  2 VIEW COMPARISON:  Radiographs of March 24, 2016. FINDINGS: Stable cardiomediastinal silhouette. Atherosclerosis of thoracic aorta is noted. Hypoinflation of the lungs is noted with mild bibasilar subsegmental atelectasis. No pneumothorax or pleural effusion is noted. Bony thorax is unremarkable. IMPRESSION: Hypoinflation of the lungs with mild bibasilar subsegmental atelectasis. Electronically Signed   By: Marijo Conception, M.D.   On: 10/13/2017 17:19    Pending Labs [employer] (From admission, onward)   Start     Ordered   Signed and Held  HIV antibody (Routine Testing)  Once,   R     Signed and Held   Signed and Held  CBC  Tomorrow morning,   R     Signed and Held   Signed and Held  CBC  (heparin)  Once,   R    Comments:  Baseline for heparin therapy IF NOT ALREADY DRAWN.  Notify MD if PLT < 100 K.    Signed and Held   Signed and Held  Creatinine, serum  (heparin)  Once,   R    Comments:  Baseline for heparin therapy IF NOT ALREADY DRAWN.    Signed and Held   Signed and Held  Basic metabolic panel  Once,   R     Signed and Held   Signed and Held  Magnesium  Add-on,   R     Signed and Held   Signed and Held  Phosphorus  Add-on,   R     Signed and Held   Signed and Held  Comprehensive metabolic panel  Tomorrow morning,   R     Signed and Held      Vitals/Pain Today's Vitals   10/13/17 1730 10/13/17 1800 10/13/17 1830 10/13/17 1900  BP: 109/67 118/70 119/61 132/77  Pulse: 70 75 72 73  Resp: '20 15 20 ' (!)  22  Temp:      TempSrc:      SpO2: 93% 94% 90% 91%    Isolation Precautions No active  isolations  Medications Medications  calcium gluconate 1 g in sodium chloride 0.9 % 100 mL IVPB (0 g Intravenous Stopped 10/13/17 1822)  sodium bicarbonate injection 50 mEq (50 mEq Intravenous Given 10/13/17 1712)  sodium polystyrene (KAYEXALATE) 15 GM/60ML suspension 15 g (15 g Oral Given 10/13/17 1712)    Mobility walks with person assist

## 2017-10-13 NOTE — Patient Instructions (Signed)

## 2017-10-13 NOTE — ED Provider Notes (Signed)
[facility] DEPT Provider Note   CSN: [id number] Arrival date & time: 10/13/17  1421     History   Chief Complaint Chief Complaint  Patient presents with  . Abnormal Labs    HPI  Tami [name] is a 61 y.o. Female history of liver transplant (1996, done at [facility], on [place]), chronic pancytopenia,  hypertension, hepatitis and arthritis, presents from her doctor's office for abnormal labs, with a creatinine of 5.9 and potassium of 5.3 her routine hematology visit today.  No history of renal failure in the past, but was previously followed by nephrologist at [facility] who is no longer in practice, NP at hematology clinic spoke with patient's transplant physician Dr. Dory Horn at [facility] who advised that her antirejection meds could be contributing to renal failure, but there may be something else driving the sudden increase, and he recommended patient go to the ED for workup.  She reports she has had a viral upper respiratory infection for the past week, with nasal congestion and cough, no fevers, but some chills, she reports she is otherwise been well.  She felt a little bit dizzy for the past 2 days, patient does report she has not been drinking water like she should recently.  Patient denies any chest pain or shortness of breath, no abdominal pain or flank pain, no hematuria, no history of kidney stones.      Past Medical History:  Diagnosis Date  . Arthritis   . Hepatitis, autoimmune ([place]) 12/08/2011  . Hypertension     Patient Active Problem List   Diagnosis Date Noted  . Encounter for therapeutic drug level monitoring 10/22/2014  . DDD (degenerative disc disease), lumbosacral 01/02/2014  . Diabetes ([place]) 01/02/2014  . Disorder of sacrum 01/02/2014  . BP (high blood pressure) 01/02/2014  . Back disorder 01/02/2014  . Thoracic and lumbosacral neuritis 01/02/2014  . Osteoarthritis of right knee 09/13/2013  . Chronic low back pain 09/13/2013  . Arthritis of knee,  degenerative 09/13/2013  . Body aches 07/26/2013  . Pain in thoracic spine 07/26/2013  . Extremity pain 05/28/2013  . Clinical depression 03/30/2013  . HLD (hyperlipidemia) 03/30/2013  . Cannot sleep 03/30/2013  . 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 H/O liver transplant ([place]) 03/30/2013  . Malaise and fatigue 03/30/2013  . OP (osteoporosis) 03/30/2013  . Arthralgia of lower leg 03/30/2013  . Breath shortness 03/30/2013  . Avitaminosis D 03/30/2013  . Hepatitis, autoimmune ([place]) 12/08/2011  . Autoimmune hepatitis ([place]) 12/08/2011  . Anemia of renal disease 09/28/2011  . Pancytopenia ([place]) 09/28/2011  . Bone marrow failure ([place]) 09/28/2011  . Encounter for general adult medical examination without abnormal findings 07/14/2010    Past Surgical History:  Procedure Laterality Date  . ABDOMINAL HYSTERECTOMY    . LIVER TRANSPLANT     1996  . TONSILLECTOMY      OB History    No data available       Home Medications    Prior to Admission medications   Medication Sig Start Date End Date Taking? Authorizing Provider  albuterol (PROVENTIL HFA;VENTOLIN HFA) 108 (90 Base) MCG/ACT inhaler Inhale into the lungs. 03/24/16 03/24/17  [provider]  alendronate (FOSAMAX) 70 MG tablet Take 70 mg by mouth once a week. 10/07/15   [provider]  allopurinol (ZYLOPRIM) 100 MG tablet Take 100 mg by mouth daily. 03/18/15   [provider]  amLODipine (NORVASC) 5 MG tablet Take 5 mg by mouth daily. 12/28/15   [provider]  Ascorbic Acid (VITAMIN C) 250 MG CHEW Chew by mouth every morning.    [provider]  azithromycin (ZITHROMAX Z-PAK) 250 MG tablet Take as directed on package. 07/14/17   [name], MD  calcitonin, salmon, (MIACALCIN/FORTICAL) 200 UNIT/ACT nasal spray Place 1 spray into alternate nostrils daily.    [provider]  Calcium Carbonate-Vit D-Min (CALCIUM 1200 PO) Take by mouth 2 (two) times daily.    [provider]  cefdinir (OMNICEF) 300 MG capsule Take 300 mg by mouth 2 (two) times daily. 09/23/17   [provider]  cycloSPORINE modified (NEORAL) 25 MG capsule Take 25 mg by mouth 2 (two) times daily. Takes 3 tabs in the morning and 2 tabs at bedtime.    [provider]  diclofenac sodium (VOLTAREN) 1 % GEL Note:Apply 4 grams to painful knees, up to 4 times daily, as needed 10/14/15   [provider]  gabapentin (NEURONTIN) 300 MG capsule Take 1 capsule (300 mg total) by mouth 2 (two) times daily. 04/15/15   [name], MD  hydrochlorothiazide (HYDRODIURIL) 12.5 MG tablet Take 2 tablets (25 mg total) by mouth daily. 10/03/14   [place], Holli Humbles, NP  metoprolol succinate (TOPROL-XL) 50 MG 24 hr tablet Take 50 mg by mouth daily. 03/18/15   [provider]  Multiple Vitamins-Minerals (MULTIVITAMIN WITH MINERALS) tablet Take 1 tablet by mouth daily.      [provider]  ondansetron (ZOFRAN-ODT) 4 MG disintegrating tablet Take 4 mg by mouth every 8 (eight) hours as needed for nausea or vomiting.    [provider]  Oxycodone HCl 10 MG TABS Take 10 mg by mouth as needed. 03/15/17   [provider]  promethazine (PHENERGAN) 12.5 MG tablet Take 1 tablet (12.5 mg total) by mouth every 6 (six) hours as needed. 01/09/17 04/06/18  [place], Holli Humbles, NP  Vitamin D, Ergocalciferol, (DRISDOL) 50000 units CAPS capsule Take 50,000 Units by mouth every 14 (fourteen) days. 11/10/15   [provider]    Family History Family History  Problem Relation Age of Onset  . Stroke Mother   . Cancer Father     Social History Social History   Tobacco Use  . Smoking status: Never Smoker  . Smokeless tobacco: Never Used  . Tobacco comment: never used tobacco  Substance Use Topics  . Alcohol use: Yes    Alcohol/week: 0.0 oz  . Drug use: No     Allergies   Iodinated diagnostic agents; Ioxaglate; and Latex   Review of Systems Review of Systems   Constitutional: Negative for chills and fever.  HENT: Positive for congestion, rhinorrhea and sore throat.   Eyes: Negative for visual disturbance.  Respiratory: Positive for cough. Negative for chest tightness and shortness of breath.   Cardiovascular: Negative for chest pain and palpitations.  Gastrointestinal: Negative for abdominal pain, constipation, diarrhea, nausea and vomiting.  Genitourinary: Negative for difficulty urinating, dysuria, flank pain, frequency and hematuria.  Musculoskeletal: Negative for arthralgias and myalgias.  Skin: Negative for rash.  Neurological: Positive for dizziness. Negative for numbness and headaches.     Physical Exam Updated Vital Signs BP 116/62 (BP Location: Left Arm)   Pulse 81   Temp 97.9 F (36.6 C) (Oral)   SpO2 92%   Physical Exam  Constitutional: She is oriented to person, place, and time. She appears well-developed and well-nourished. No distress.  HENT:  Head: Normocephalic and atraumatic.  Eyes: EOM are normal. Pupils are equal,  round, and reactive to light. Right eye exhibits no discharge. Left eye exhibits no discharge.  Neck: Neck supple.  Cardiovascular: Normal rate, regular rhythm, normal heart sounds and intact distal pulses.  Pulmonary/Chest: Effort normal and breath sounds normal. No stridor. No respiratory distress. She has no wheezes. She has no rales.  Abdominal: Soft. Bowel sounds are normal. She exhibits no distension and no mass. There is no tenderness. There is no guarding.  Abdomen nontender to palpation in all 4 quadrants, no CVA tenderness  Musculoskeletal: She exhibits no edema or deformity.  No lower extremity edema  Neurological: She is alert and oriented to person, place, and time. Coordination normal.  Speech is clear, able to follow commands CN III-XII intact Normal strength in upper and lower extremities bilaterally including dorsiflexion and plantar flexion, strong and equal grip strength Sensation  normal to light and sharp touch Moves extremities without ataxia, coordination intact Normal finger to nose and rapid alternating movements No pronator drift  Skin: Skin is warm and dry. Capillary refill takes less than 2 seconds. She is not diaphoretic.  Psychiatric: She has a normal mood and affect. Her behavior is normal.  Nursing note and vitals reviewed.    ED Treatments / Results  Labs (all labs ordered are listed, but only abnormal results are displayed) Labs Reviewed  CBC WITH DIFFERENTIAL/PLATELET - Abnormal; Notable for the following components:      Result Value   WBC 3.6 (*)    RBC 2.92 (*)    Hemoglobin 9.0 (*)    HCT 26.7 (*)    Platelets 105 (*)    All other components within normal limits  COMPREHENSIVE METABOLIC PANEL - Abnormal; Notable for the following components:   Sodium 130 (*)    Potassium 6.6 (*)    CO2 15 (*)    Glucose, Bld 100 (*)    BUN 61 (*)    Creatinine, Ser 5.68 (*)    Calcium 7.9 (*)    ALT 6 (*)    GFR calc non Af Amer 7 (*)    GFR calc Af Amer 8 (*)    All other components within normal limits  URINALYSIS, ROUTINE W REFLEX MICROSCOPIC - Abnormal; Notable for the following components:   APPearance HAZY (*)    Leukocytes, UA SMALL (*)    Bacteria, UA RARE (*)    Squamous Epithelial / LPF 0-5 (*)    All other components within normal limits    EKG  EKG Interpretation None       Radiology Dg Chest 2 View  Result Date: 10/13/2017 CLINICAL DATA:  Cough. EXAM: CHEST  2 VIEW COMPARISON:  Radiographs of March 24, 2016. FINDINGS: Stable cardiomediastinal silhouette. Atherosclerosis of thoracic aorta is noted. Hypoinflation of the lungs is noted with mild bibasilar subsegmental atelectasis. No pneumothorax or pleural effusion is noted. Bony thorax is unremarkable. IMPRESSION: Hypoinflation of the lungs with mild bibasilar subsegmental atelectasis. Electronically Signed   By: Marijo Conception, M.D.   On: 10/13/2017 17:19   US  Renal  Result Date: 10/13/2017 CLINICAL DATA:  Acute renal failure. EXAM: RENAL / URINARY TRACT ULTRASOUND COMPLETE COMPARISON:  None. FINDINGS: Right Kidney: Length: 5.5 cm.  Poorly visualized.  No gross abnormality. Left Kidney: Length: 8.5 cm. Mild increased cortical echogenicity and mild cortical thinning. No mass or hydronephrosis visualized. Bladder: Minimal dependent echogenic debris. IMPRESSION: Atrophic poorly visualized right kidney. Left kidney at the lower limits of normal in size demonstrating findings which can be seen  with medical renal disease. No hydronephrosis. Electronically Signed   By: Marin Olp M.D.   On: 10/13/2017 21:47    Procedures .Critical Care Performed by: Jacqlyn Larsen, PA-C Authorized by: Jacqlyn Larsen, PA-C   Critical care provider statement:    Critical care time (minutes):  35   Critical care start time:  10/14/2017 3:30 PM   Critical care time was exclusive of:  Separately billable procedures and treating other patients   Critical care was necessary to treat or prevent imminent or life-threatening deterioration of the following conditions:  Renal failure   Critical care was time spent personally by me on the following activities:  Development of treatment plan with patient or surrogate, ordering and performing treatments and interventions, ordering and review of laboratory studies, discussions with consultants, examination of patient, obtaining history from patient or surrogate and review of old charts   (including critical care time)  Medications Ordered in ED Medications  calcium gluconate 1 g in sodium chloride 0.9 % 100 mL IVPB (not administered)  sodium bicarbonate injection 50 mEq (50 mEq Intravenous Given 10/13/17 1712)  sodium polystyrene (KAYEXALATE) 15 GM/60ML suspension 15 g (15 g Oral Given 10/13/17 1712)     Initial Impression / Assessment and Plan / ED Course  I have reviewed the triage vital signs and the nursing  notes.  Pertinent labs & imaging results that were available during my care of the patient were reviewed by me and considered in my medical decision making (see chart for details).  Patient presents from her hematologist office for elevated creatinine and potassium.  URI symptoms for the last week and some dizziness yesterday and today, but no other focal medical complaints, no abdominal pain, dysuria, hematuria or flank pain, no chest pain or shortness of breath.  On exam vitals are normal, and patient is well-appearing.  Abdomen nontender, no neurologic deficits.  Creatinine here is 5.68 increased from 1.6 in September. Potassium is 6.6, will give calcium gluconate, bicarb and Kayexalate to reduce potassium, EKG with borderline peaked T waves, narrow QRS complex.  Should with pancytopenia, but this is a chronic issue, UA without significant changes, no evidence of infection.  Creat  Date Value Ref Range Status  10/13/2017 5.9 (HH) 0.6 - 1.2 mg/dl Final  07/14/2017 1.6 (H) 0.6 - 1.2 mg/dl Final   Chest x-ray shows mild bibasilar atelectasis, but no obvious fluid on the lungs.  Unclear etiology for new onset acute renal failure, hematology spoke with patient's transplant doctor who thought that antirejection med could potentially be contributing, regardless patient will need to be admitted for workup, and potential dialysis.  Hospitalist consulted for admission  Spoke with Dr. Ara Kussmaul with [facility] who will admit the patient. Hospitalist requests nephrology consult.  Spoke with Dr. Joelyn Oms with nephrology who recommends renal ultrasound, and will see the patient.  Patient discussed with Dr. Rogene Houston, who saw patient as well and agrees with plan.   Final Clinical Impressions(s) / ED Diagnoses   Final diagnoses:  Acute renal failure, unspecified acute renal failure type [facility])    ED Discharge Orders    None       Janet Berlin 10/14/17 3532    Fredia Sorrow, MD 10/16/17 [phone]

## 2017-10-13 NOTE — Progress Notes (Signed)
Hematology and Oncology Follow Up Visit  Tami [name] [id number] [birth year] 61 y.o. 10/13/2017   Principle Diagnosis:  Chronic pancytopenia  Status post liver transplant for autoimmune hepatitis Anemia of chronic kidney disease  Current Therapy:   Aranesp 300 mcg SQ as needed for hemoglobin less than 10    Interim History:  Tami [name] is here today for follow-up. She is here today for follow-up. She has a head cold with congestion and drainage. She has a cough with white sputum. She has had some mild SOB with over exertion. She has also had some nausea without vomiting.  There is a little swelling in her feet. No pitting edema at this time. Pedal pulses are +1.  No bleeding, bruising or petechiae. No lymphadenopathy found on exam.  No tenderness, numbness or tingling in her extremities at this time. No c/o pain.  Her appetite is good and she is staying well hydrated. Her weight is up 8 lbs since September.  She is so excited that her daughter has moved back to the [name]. They plan to go to [place] for new years.   ECOG Performance Status: 1 - Symptomatic but completely ambulatory  Medications:  Allergies as of 10/13/2017      Reactions   Iodinated Diagnostic Agents    Ioxaglate    Latex       Medication List        Accurate as of 10/13/17 11:24 AM. Always use your most recent med list.          albuterol 108 (90 Base) MCG/ACT inhaler Commonly known as:  PROVENTIL HFA;VENTOLIN HFA Inhale into the lungs.   alendronate 70 MG tablet Commonly known as:  FOSAMAX Take 70 mg by mouth once a week.   allopurinol 100 MG tablet Commonly known as:  ZYLOPRIM Take 100 mg by mouth daily.   amLODipine 5 MG tablet Commonly known as:  NORVASC Take 5 mg by mouth daily.   azithromycin 250 MG tablet Commonly known as:  ZITHROMAX Z-PAK Take as directed on package.   calcitonin (salmon) 200 UNIT/ACT nasal spray Commonly known as:  MIACALCIN/FORTICAL Place 1 spray into  alternate nostrils daily.   CALCIUM 1200 PO Take by mouth 2 (two) times daily.   gabapentin 300 MG capsule Commonly known as:  NEURONTIN Take 1 capsule (300 mg total) by mouth 2 (two) times daily.   hydrochlorothiazide 12.5 MG tablet Commonly known as:  HYDRODIURIL Take 2 tablets (25 mg total) by mouth daily.   metoprolol succinate 50 MG 24 hr tablet Commonly known as:  TOPROL-XL Take 50 mg by mouth daily.   multivitamin with minerals tablet Take 1 tablet by mouth daily.   NEORAL 25 MG capsule Generic drug:  cycloSPORINE modified Take 25 mg by mouth 2 (two) times daily. Takes 3 tabs in the morning and 2 tabs at bedtime.   ondansetron 4 MG disintegrating tablet Commonly known as:  ZOFRAN-ODT Take 4 mg by mouth every 8 (eight) hours as needed for nausea or vomiting.   Oxycodone HCl 10 MG Tabs Take 10 mg by mouth as needed.   promethazine 12.5 MG tablet Commonly known as:  PHENERGAN Take 1 tablet (12.5 mg total) by mouth every 6 (six) hours as needed.   Vitamin C 250 MG Chew Chew by mouth every morning.   Vitamin D (Ergocalciferol) 50000 units Caps capsule Commonly known as:  DRISDOL Take 50,000 Units by mouth every 14 (fourteen) days.   VOLTAREN 1 % Gel Generic drug:  diclofenac sodium Note:Apply 4 grams to painful knees, up to 4 times daily, as needed       Allergies:  Allergies  Allergen Reactions  . Iodinated Diagnostic Agents   . Ioxaglate   . Latex     Past Medical History, Surgical history, Social history, and Family History were reviewed and updated.  Review of Systems: All other 10 point review of systems is negative.   Physical Exam:  vitals were not taken for this visit.   Wt Readings from Last 3 Encounters:  07/14/17 159 lb (72.1 kg)  04/11/17 155 lb (70.3 kg)  01/09/17 152 lb 12.8 oz (69.3 kg)    Ocular: Sclerae unicteric, pupils equal, round and reactive to light Ear-nose-throat: Oropharynx clear, dentition fair Lymphatic: No  cervical, supraclavicular or axillary adenopathy Lungs no rales or rhonchi, good excursion bilaterally Heart regular rate and rhythm, no murmur appreciated Abd soft, nontender, positive bowel sounds, no liver or spleen tip palpated on exam, no fluid wave  MSK no focal spinal tenderness, no joint edema Neuro: non-focal, well-oriented, appropriate affect Breasts: Deferred   Lab Results  Component Value Date   WBC 4.3 10/13/2017   HGB 9.0 (L) 10/13/2017   HCT 27.5 (L) 10/13/2017   MCV 94 10/13/2017   PLT 124 (L) 10/13/2017   Lab Results  Component Value Date   FERRITIN 510 (H) 09/22/2016   IRON 172 (H) 09/22/2016   TIBC 191 (L) 09/22/2016   UIBC 20 (L) 09/22/2016   IRONPCTSAT 90 (H) 09/22/2016   Lab Results  Component Value Date   RETICCTPCT 1.2 09/29/2015   RBC 2.92 (L) 10/13/2017   RETICCTABS 38.0 09/29/2015   No results found for: KPAFRELGTCHN, LAMBDASER, KAPLAMBRATIO No results found for: Kandis Cocking, IGMSERUM Lab Results  Component Value Date   TOTALPROTELP 9.4 (H) 11/19/2008   ALBUMINELP 41.1 (L) 11/19/2008   A1GS 4.0 11/19/2008   A2GS 7.1 11/19/2008   BETS 4.0 (L) 11/19/2008   BETA2SER 7.1 (H) 11/19/2008   GAMS 36.7 (H) 11/19/2008   MSPIKE NOT DET 11/19/2008   SPEI * 11/19/2008     Chemistry      Component Value Date/Time   NA 143 07/14/2017 1005   NA 138 04/11/2017 1023   K 4.6 07/14/2017 1005   K 5.2 No visable hemolysis (H) 04/11/2017 1023   CL 108 07/14/2017 1005   CO2 23 07/14/2017 1005   CO2 19 (L) 04/11/2017 1023   BUN 20 07/14/2017 1005   BUN 33.4 (H) 04/11/2017 1023   CREATININE 1.6 (H) 07/14/2017 1005   CREATININE 1.8 (H) 04/11/2017 1023      Component Value Date/Time   CALCIUM 8.6 07/14/2017 1005   CALCIUM 9.0 04/11/2017 1023   ALKPHOS 77 07/14/2017 1005   ALKPHOS 80 04/11/2017 1023   AST 26 07/14/2017 1005   AST 16 04/11/2017 1023   ALT 18 07/14/2017 1005   ALT 8 04/11/2017 1023   BILITOT 0.70 07/14/2017 1005   BILITOT 0.54  04/11/2017 1023      Impression and Plan: Tami [name] a very pleasant 61 yo African American female with anemia of chronic renal disease. Hgb today is 9.0 so she will get Aranesp this visit.  The most concerning issue at this time is her elevated creatinine of 5.9. She was 1.6 in September. BUN is 58 and potassium is 5.3 She is symptomatic with swelling in her feet. She has a head cold right now with productive cough.  She has history of liver transplant 22  years ago and has been on Neoral for 20 years. I called to speak with her nephrologist at [facility] and he is no longer practicing there. She had not seen him since 2015.  I was able to speak with her transplant physician Dr. Dory Horn with [facility] and he advised she be treated locally. He felt that the Neoral could possibly contribute to the renal failure but that there may also be something else driving this sudden jump in her creatinine. We will have her go immediately to the ED at [facility] for further work up.   He states that we were welcome to call the Hepatologist Service there with any questions. She verbalized understanding and agreement with the plan. I gave her the address to [facility] and she is currently on her way.   Laverna Peace, NP 12/21/201811:24 AM

## 2017-10-13 NOTE — H&P (Signed)
History and Physical   [facility] [place] @ [facility] Admission History and Physical [employer], D.O.    Patient Name: Tami [name] MR#: [id number] Date of Birth: [birth year] Date of Admission: 10/13/2017  Referring MD/NP/PA: Benedetto Goad Primary Care Physician: [place]  Chief Complaint:  Chief Complaint  Patient presents with  . Abnormal Labs    HPI: Tami [name] is a 61 y.o. female with a known history of liver transplant in1996, at [facility], on Neoral, chronic pancytopenia, hypertension, hepatitis, arthritis presents to the emergency department for evaluation of abnormal labs.  Patient was in a usual state of health until this afternoon when she was at her hematologists office for a routine visit when her labs revealed AKI and hyperkalemia.  The hematology NP spoke with her transplant physician Dr. Dory Horn who thought Neoral MAY be contributing to her acute renal failure, but asked that she be admitted and seen by nephrology for evaluation.    Patient reports a one week history of diarrhea for which she has been eating three bananas a day as well as five days of URI symptoms including cough, congestion, and dizziness.  Diarrhea and URI symptoms mostly resolved at this point.   Patient denies fevers/chills, weakness, chest pain, shortness of breath, N/V/C/D, abdominal pain, dysuria/frequency, changes in mental status.    Otherwise there has been no change in status. Patient has been taking medication as prescribed and there has been no recent change in medication or diet.  No recent antibiotics.  There has been no recent illness, hospitalizations, travel or sick contacts.    EMS/ED Course: Patient received bicarb, Kayexalate and Ca Gluconatein the ED. Medical admission has been requested for further management of AKI, hyperkalemia.  Review of Systems:  CONSTITUTIONAL: No fever/chills, fatigue, weakness, weight gain/loss, headache. Positive  dizziness EYES: No blurry or double vision. ENT: No tinnitus, postnasal drip, redness or soreness of the oropharynx. Positive cough and nasal congestion.  RESPIRATORY: No cough, dyspnea, wheeze.  No hemoptysis.  CARDIOVASCULAR: No chest pain, palpitations, syncope, orthopnea. No lower extremity edema.  GASTROINTESTINAL: Positive diarrhea No nausea, vomiting, abdominal pain, diarrhea, constipation.  No hematemesis, melena or hematochezia. GENITOURINARY: No dysuria, frequency, hematuria. ENDOCRINE: No polyuria or nocturia. No heat or cold intolerance. HEMATOLOGY: No anemia, bruising, bleeding. INTEGUMENTARY: No rashes, ulcers, lesions. MUSCULOSKELETAL: No arthritis, gout, dyspnea. NEUROLOGIC: No numbness, tingling, ataxia, seizure-type activity, weakness. PSYCHIATRIC: No anxiety, depression, insomnia.   Past Medical History:  Diagnosis Date  . Arthritis   . Hepatitis, autoimmune ([place]) 12/08/2011  . Hypertension     Past Surgical History:  Procedure Laterality Date  . ABDOMINAL HYSTERECTOMY    . LIVER TRANSPLANT     1996  . TONSILLECTOMY       reports that  has never smoked. she has never used smokeless tobacco. She reports that she drinks alcohol. She reports that she does not use drugs.  Allergies  Allergen Reactions  . Iodinated Diagnostic Agents   . Ioxaglate   . Latex     Family History  Problem Relation Age of Onset  . Stroke Mother   . Cancer Father     Prior to Admission medications   Medication Sig Start Date End Date Taking? Authorizing Provider  allopurinol (ZYLOPRIM) 100 MG tablet Take 100 mg by mouth daily. 03/18/15  Yes [provider]  amLODipine (NORVASC) 5 MG tablet Take 5 mg by mouth daily. 12/28/15  Yes [provider]  Ascorbic Acid (VITAMIN C) 250 MG  CHEW Chew 1 tablet by mouth every morning.    Yes [provider]  calcitonin, salmon, (MIACALCIN/FORTICAL) 200 UNIT/ACT nasal spray Place 1 spray into alternate nostrils daily.    Yes [provider]  Calcium Carbonate-Vit D-Min (CALCIUM 1200 PO) Take 1 tablet by mouth 2 (two) times daily.    Yes [provider]  cefdinir (OMNICEF) 300 MG capsule Take 300 mg by mouth 2 (two) times daily. 09/23/17  Yes [provider]  cycloSPORINE modified (NEORAL) 25 MG capsule Take 50 mg by mouth 2 (two) times daily.    Yes [provider]  diclofenac sodium (VOLTAREN) 1 % GEL Note:Apply 4 grams to painful knees, up to 4 times daily, as needed for pain 10/14/15  Yes [provider]  gabapentin (NEURONTIN) 300 MG capsule Take 1 capsule (300 mg total) by mouth 2 (two) times daily. 04/15/15  Yes [name], MD  hydrochlorothiazide (HYDRODIURIL) 12.5 MG tablet Take 2 tablets (25 mg total) by mouth daily. 10/03/14  Yes [place], Holli Humbles, NP  metoprolol succinate (TOPROL-XL) 50 MG 24 hr tablet Take 50 mg by mouth daily. 03/18/15  Yes [provider]  Multiple Vitamins-Minerals (MULTIVITAMIN WITH MINERALS) tablet Take 1 tablet by mouth daily.     Yes [provider]  Oxycodone HCl 10 MG TABS Take 10 mg by mouth 4 (four) times daily as needed (pain).  03/15/17  Yes [provider]  promethazine (PHENERGAN) 12.5 MG tablet Take 1 tablet (12.5 mg total) by mouth every 6 (six) hours as needed. Patient taking differently: Take 12.5 mg by mouth every 6 (six) hours as needed for nausea or vomiting.  01/09/17 04/06/18 Yes [place], Holli Humbles, NP  Vitamin D, Ergocalciferol, (DRISDOL) 50000 units CAPS capsule Take 50,000 Units by mouth every 14 (fourteen) days. 11/10/15  Yes [provider]  albuterol (PROVENTIL HFA;VENTOLIN HFA) 108 (90 Base) MCG/ACT inhaler Inhale into the lungs. 03/24/16 03/24/17  [provider]  alendronate (FOSAMAX) 70 MG tablet Take 70 mg by mouth once a week. 10/07/15   [provider]  azithromycin (ZITHROMAX Z-PAK) 250 MG tablet Take as directed on package. Patient not taking: Reported on  10/13/2017 07/14/17   [name], MD  ondansetron (ZOFRAN-ODT) 4 MG disintegrating tablet Take 4 mg by mouth every 8 (eight) hours as needed for nausea or vomiting.    [provider]    Physical Exam: Vitals:   10/13/17 1533 10/13/17 1534 10/13/17 1730 10/13/17 1800  BP: 126/79 126/79 109/67 118/70  Pulse: 78 77 70 75  Resp: 15 19 20 15   Temp:      TempSrc:      SpO2: 95% 94% 93% 94%    GENERAL: 61 y.o.-year-old female patient, well-developed, well-nourished lying in the bed in no acute distress.  Pleasant and cooperative.   HEENT: Head atraumatic, normocephalic. Pupils equal. Mucus membranes moist. NECK: Supple, full range of motion. No JVD, no bruit heard. No thyroid enlargement, no tenderness, no cervical lymphadenopathy. CHEST: Normal breath sounds bilaterally. No wheezing, rales, rhonchi or crackles. No use of accessory muscles of respiration.  No reproducible chest wall tenderness.  CARDIOVASCULAR: S1, S2 normal. No murmurs, rubs, or gallops. Cap refill <2 seconds. Pulses intact distally.  ABDOMEN: Soft, nondistended, nontender. No rebound, guarding, rigidity. Normoactive bowel sounds present in all four quadrants.  EXTREMITIES: Mild bilateral pitting edema to midcalf. No, cyanosis, or clubbing. No calf tenderness or Homan's sign.  NEUROLOGIC: The patient is alert and oriented x 3.  Cranial nerves II through XII are grossly intact with no focal sensorimotor deficit. PSYCHIATRIC:  Normal affect, mood, thought content. SKIN: Warm, dry, and intact without obvious rash, lesion, or ulcer.    Labs on Admission:  CBC: Recent Labs  Lab 10/13/17 1106 10/13/17 1559  WBC 4.3 3.6*  NEUTROABS 2.7 2.4  HGB 9.0* 9.0*  HCT 27.5* 26.7*  MCV 94 91.4  PLT 124* 885*   Basic Metabolic Panel: Recent Labs  Lab 10/13/17 1106 10/13/17 1559  NA 140 130*  K 5.3 no visable hemolysis* 6.6*  CL 104 106  CO2 17* 15*  GLUCOSE 116 100*  BUN 58* 61*  CREATININE 5.9* 5.68*   CALCIUM 8.4 7.9*   GFR: CrCl cannot be calculated (Unknown ideal weight.). Liver Function Tests: Recent Labs  Lab 10/13/17 1106 10/13/17 1559  AST 18 30  ALT 15 6*  ALKPHOS 68 58  BILITOT 0.50 0.6  PROT 7.7 8.0  ALBUMIN 3.3 3.5   No results for input(s): LIPASE, AMYLASE in the last 168 hours. No results for input(s): AMMONIA in the last 168 hours. Coagulation Profile: No results for input(s): INR, PROTIME in the last 168 hours. Cardiac Enzymes: No results for input(s): CKTOTAL, CKMB, CKMBINDEX, TROPONINI in the last 168 hours. BNP (last 3 results) No results for input(s): PROBNP in the last 8760 hours. HbA1C: No results for input(s): HGBA1C in the last 72 hours. CBG: No results for input(s): GLUCAP in the last 168 hours. Lipid Profile: No results for input(s): CHOL, HDL, LDLCALC, TRIG, CHOLHDL, LDLDIRECT in the last 72 hours. Thyroid Function Tests: No results for input(s): TSH, T4TOTAL, FREET4, T3FREE, THYROIDAB in the last 72 hours. Anemia Panel: Recent Labs    10/13/17 1106  FERRITIN 710*  TIBC 174*  IRON 90   Urine analysis:    Component Value Date/Time   COLORURINE YELLOW 10/13/2017 1610   APPEARANCEUR HAZY (A) 10/13/2017 1610   LABSPEC 1.008 10/13/2017 1610   PHURINE 5.0 10/13/2017 1610   GLUCOSEU NEGATIVE 10/13/2017 1610   HGBUR NEGATIVE 10/13/2017 1610   BILIRUBINUR NEGATIVE 10/13/2017 1610   KETONESUR NEGATIVE 10/13/2017 1610   PROTEINUR NEGATIVE 10/13/2017 1610   NITRITE NEGATIVE 10/13/2017 1610   LEUKOCYTESUR SMALL (A) 10/13/2017 1610   Sepsis Labs: @LABRCNTIP (procalcitonin:4,lacticidven:4) )No results found for this or any previous visit (from the past 240 hour(s)).   Radiological Exams on Admission: Dg Chest 2 View  Result Date: 10/13/2017 CLINICAL DATA:  Cough. EXAM: CHEST  2 VIEW COMPARISON:  Radiographs of March 24, 2016. FINDINGS: Stable cardiomediastinal silhouette. Atherosclerosis of thoracic aorta is noted. Hypoinflation of the lungs  is noted with mild bibasilar subsegmental atelectasis. No pneumothorax or pleural effusion is noted. Bony thorax is unremarkable. IMPRESSION: Hypoinflation of the lungs with mild bibasilar subsegmental atelectasis. Electronically Signed   By: Marijo Conception, M.D.   On: 10/13/2017 17:19    EKG: Normal sinus rhythm at 82 bpm with normal axis, PVCs, RBB and nonspecific ST-T wave changes.   Assessment/Plan  This is a 61 y.o. female with a history of renal transplant in1996, at [facility], on Neoral, chronic pancytopenia, hypertension, hepatitis, arthritis  now being admitted with:  #. Acute kidney injury Cr 5.9, up from1.6 in Sept 2018. - Admit inpatient, monitor on telemetry  - Rec'd bicarb, kayexalate, Ca Gluconate in ED - IV fluids and repeat BMP  - Avoid nephrotoxic medications: hold HCTZ for now - Check renal US - Bladder scan and place foley catheter if evidence of urinary retention - Nephrology  consulted by EDP - Patient's transplant physician is Dr. Dory Horn at [facility] and was contacted byhematology NP.   #. Chronic pancytopenia - Rec'd Aranesp at hematology today.   #. History of hypertension - Continue Norvasc - Hold HCTZ  Admission status: Inpatient, telemetry  IV Fluids: NS Diet/Nutrition: Heart Healthy Consults called: Nephrology  DVT Px: Heparin, SCDs and early ambulation. Code Status: Full Code  Disposition Plan: To home in 1-2 days  All the records are reviewed and case discussed with ED provider. Management plans discussed with the patient and/or family who express understanding and agree with plan of care.  [employer] D.O. on 10/13/2017 at 6:46 PM  CC: Primary care physician; Center, [facility]   10/13/2017, 6:46 PM

## 2017-10-13 NOTE — Telephone Encounter (Signed)
Critical Value Creatinine 5.9 Laverna Peace NP notified. No orders at this time

## 2017-10-13 NOTE — Telephone Encounter (Signed)
I spoke with Tami [name] to let her know creatinine is significantly elevated at 5.9. She is aware that I have spoken with Dr. Dory Horn at [facility] and that he advised she could be treated locally. She was instructed to go to the ED now and verbalized understanding. She is on her way now.

## 2017-10-14 LAB — COMPREHENSIVE METABOLIC PANEL
ALT: 10 U/L — ABNORMAL LOW (ref 14–54)
ANION GAP: 7 (ref 5–15)
AST: 19 U/L (ref 15–41)
Albumin: 2.9 g/dL — ABNORMAL LOW (ref 3.5–5.0)
Alkaline Phosphatase: 54 U/L (ref 38–126)
BUN: 57 mg/dL — ABNORMAL HIGH (ref 6–20)
CHLORIDE: 110 mmol/L (ref 101–111)
CO2: 19 mmol/L — ABNORMAL LOW (ref 22–32)
Calcium: 7.8 mg/dL — ABNORMAL LOW (ref 8.9–10.3)
Creatinine, Ser: 5.03 mg/dL — ABNORMAL HIGH (ref 0.44–1.00)
GFR, EST AFRICAN AMERICAN: 10 mL/min — AB (ref >60)
GFR, EST NON AFRICAN AMERICAN: 8 mL/min — AB (ref >60)
Glucose, Bld: 78 mg/dL (ref 65–99)
POTASSIUM: 5 mmol/L (ref 3.5–5.1)
Sodium: 136 mmol/L (ref 135–145)
Total Bilirubin: 0.6 mg/dL (ref 0.3–1.2)
Total Protein: 6.7 g/dL (ref 6.5–8.1)

## 2017-10-14 LAB — CBC
HCT: 25.3 % — ABNORMAL LOW (ref 36.0–46.0)
HEMOGLOBIN: 8.6 g/dL — AB (ref 12.0–15.0)
MCH: 31.2 pg (ref 26.0–34.0)
MCHC: 34 g/dL (ref 30.0–36.0)
MCV: 91.7 fL (ref 78.0–100.0)
Platelets: 98 10*3/uL — ABNORMAL LOW (ref 150–400)
RBC: 2.76 MIL/uL — ABNORMAL LOW (ref 3.87–5.11)
RDW: 13.8 % (ref 11.5–15.5)
WBC: 2.5 10*3/uL — AB (ref 4.0–10.5)

## 2017-10-14 LAB — BASIC METABOLIC PANEL
ANION GAP: 8 (ref 5–15)
BUN: 62 mg/dL — ABNORMAL HIGH (ref 6–20)
CALCIUM: 8 mg/dL — AB (ref 8.9–10.3)
CO2: 19 mmol/L — ABNORMAL LOW (ref 22–32)
CREATININE: 5.32 mg/dL — AB (ref 0.44–1.00)
Chloride: 109 mmol/L (ref 101–111)
GFR calc non Af Amer: 8 mL/min — ABNORMAL LOW (ref >60)
GFR, EST AFRICAN AMERICAN: 9 mL/min — AB (ref >60)
Glucose, Bld: 94 mg/dL (ref 65–99)
Potassium: 4.6 mmol/L (ref 3.5–5.1)
SODIUM: 136 mmol/L (ref 135–145)

## 2017-10-14 LAB — PHOSPHORUS: PHOSPHORUS: 6.7 mg/dL — AB (ref 2.5–4.6)

## 2017-10-14 LAB — RETICULOCYTES: RETICULOCYTE COUNT: 2.9 % — AB (ref 0.6–2.6)

## 2017-10-14 LAB — HIV ANTIBODY (ROUTINE TESTING W REFLEX): HIV SCREEN 4TH GENERATION: NONREACTIVE

## 2017-10-14 LAB — MAGNESIUM: MAGNESIUM: 1.9 mg/dL (ref 1.7–2.4)

## 2017-10-14 MED ORDER — ONDANSETRON 4 MG PO TBDP
4.0000 mg | ORAL_TABLET | Freq: Three times a day (TID) | ORAL | Status: DC | PRN
Start: 2017-10-14 — End: 2017-10-16

## 2017-10-14 MED ORDER — PROMETHAZINE HCL 25 MG PO TABS: 12.5000 mg | ORAL_TABLET | Freq: Four times a day (QID) | ORAL | Status: DC | PRN

## 2017-10-14 NOTE — Consult Note (Signed)
Tami [name] Admit Date: 10/13/2017 10/14/2017 Rexene Agent Requesting Physician:  Daleen Bo MD  Reason for Consult:  AoCKD3  HPI:  1F seen admitted on 12/21 with acute on chronic renal failure.  Over the past several weeks patient has had difficulties with upper respiratory symptoms.  She recently completed a course of antibiotics, I believe Ceftin.  I do not think it was a sulfa antibiotic.  She also was not eating and drinking as well as usual.  She continue taking her hydrochlorothiazide daily.  Over the past several days she has felt dizzy/woozy when standing, improves when sitting or lying.  She has not fallen, but has felt that she needed to use a cane.  PMH Incudes:  Status post liver transplant for cryptogenic cirrhosis in 1996, maintained on 50 mg twice daily of cyclosporine, followed [facility] with Dr. 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 Domingo Mend at [facility] nephrology but has not been seen in quite some time; looking to establish care locally  She took several doses of Aleve last week for headache.  No diarrhea, nausea, vomiting.  No lower urinary tract symptoms.  No malodorous urine, dysuria, fever.  Upon arrival the patient had a creatinine of 5.7 with a potassium of 6.6 and a serum bicarbonate of 15.  She was treated for the hyperkalemia with conservative therapies and this morning her potassium is 5.0 and her creatinine was 5.0.  She is receiving normal saline at 75 mL's per hour.  Urine analysis yesterday with hematuria, pyuria, proteinuria.  Specific gravity is 1.008.  Renal ultrasound identified an atrophic right kidney a 5.5 cm in the left kidney without hydronephrosis at 8.5 cm.  2010 care everywhere MRI of the abdomen did not mention any discrepancy in renal size, both  being described as having a normal appearance.  2018 liver ultrasound listed the right kidney at 8.7 cm and the left kidney at 9.7 cm, and care everywhere.  She feels improved, stating that when she has gotten up to the restroom she does not feel dizzy.   Creatinine (mg/dL)  Date Value  04/11/2017 1.8 (H)  01/09/2017 1.9 (H)  11/23/2016 1.9 (H)  09/22/2016 1.8 (H)  08/11/2016 1.5 (H)  07/07/2016 1.5 (H)  05/19/2016 2.1 (H)  04/05/2016 2.0 (H)  02/02/2016 1.9 (H)  09/29/2015 2.2 (H)   Creat (mg/dl)  Date Value  10/13/2017 5.9 (HH)  07/14/2017 1.6 (H)  12/02/2015 1.7 (H)   Creatinine, Ser (mg/dL)  Date Value  10/14/2017 5.03 (H)  10/14/2017 5.32 (H)  10/13/2017 5.29 (H)  10/13/2017 5.68 (H)  ] I/Os: I/O last 3 completed shifts: In: 502.5 [I.V.:502.5] Out: 300 [Urine:300]   ROS NSAIDS: Several days of Aleve use last week IV Contrast no exposure TMP/SMX no exposure Balance of 12 systems is negative w/ exceptions as above  PMH  Past Medical History:  Diagnosis Date  . Arthritis   . Hepatitis, autoimmune ([place]) 12/08/2011  . Hypertension    PSH  Past Surgical History:  Procedure Laterality Date  . ABDOMINAL HYSTERECTOMY    . LIVER TRANSPLANT     1996  . TONSILLECTOMY     FH  Family History  Problem Relation Age of Onset  . Stroke Mother   . Cancer Father    SH  reports that  has never smoked. she has  never used smokeless tobacco. She reports that she drinks alcohol. She reports that she does not use drugs. Allergies  Allergies  Allergen Reactions  . Iodinated Diagnostic Agents   . Ioxaglate   . Latex    Home medications Prior to Admission medications   Medication Sig Start Date End Date Taking? Authorizing Provider  allopurinol (ZYLOPRIM) 100 MG tablet Take 100 mg by mouth daily. 03/18/15  Yes [provider]  amLODipine (NORVASC) 5 MG tablet Take 5 mg by mouth daily. 12/28/15  Yes [provider]  Ascorbic Acid (VITAMIN C) 250 MG  CHEW Chew 1 tablet by mouth every morning.    Yes [provider]  calcitonin, salmon, (MIACALCIN/FORTICAL) 200 UNIT/ACT nasal spray Place 1 spray into alternate nostrils daily.   Yes [provider]  Calcium Carbonate-Vit D-Min (CALCIUM 1200 PO) Take 1 tablet by mouth 2 (two) times daily.    Yes [provider]  cefdinir (OMNICEF) 300 MG capsule Take 300 mg by mouth 2 (two) times daily. 09/23/17  Yes [provider]  cycloSPORINE modified (NEORAL) 25 MG capsule Take 50 mg by mouth 2 (two) times daily.    Yes [provider]  diclofenac sodium (VOLTAREN) 1 % GEL Note:Apply 4 grams to painful knees, up to 4 times daily, as needed for pain 10/14/15  Yes [provider]  gabapentin (NEURONTIN) 300 MG capsule Take 1 capsule (300 mg total) by mouth 2 (two) times daily. 04/15/15  Yes [name], MD  hydrochlorothiazide (HYDRODIURIL) 12.5 MG tablet Take 2 tablets (25 mg total) by mouth daily. 10/03/14  Yes [place], Holli Humbles, NP  metoprolol succinate (TOPROL-XL) 50 MG 24 hr tablet Take 50 mg by mouth daily. 03/18/15  Yes [provider]  Multiple Vitamins-Minerals (MULTIVITAMIN WITH MINERALS) tablet Take 1 tablet by mouth daily.     Yes [provider]  Oxycodone HCl 10 MG TABS Take 10 mg by mouth 4 (four) times daily as needed (pain).  03/15/17  Yes [provider]  promethazine (PHENERGAN) 12.5 MG tablet Take 1 tablet (12.5 mg total) by mouth every 6 (six) hours as needed. Patient taking differently: Take 12.5 mg by mouth every 6 (six) hours as needed for nausea or vomiting.  01/09/17 04/06/18 Yes [place], Holli Humbles, NP  Vitamin D, Ergocalciferol, (DRISDOL) 50000 units CAPS capsule Take 50,000 Units by mouth every 14 (fourteen) days. 11/10/15  Yes [provider]  albuterol (PROVENTIL HFA;VENTOLIN HFA) 108 (90 Base) MCG/ACT inhaler Inhale into the lungs. 03/24/16 03/24/17  [provider]  alendronate (FOSAMAX)  70 MG tablet Take 70 mg by mouth once a week. 10/07/15   [provider]  azithromycin (ZITHROMAX Z-PAK) 250 MG tablet Take as directed on package. Patient not taking: Reported on 10/13/2017 07/14/17   [name], MD  ondansetron (ZOFRAN-ODT) 4 MG disintegrating tablet Take 4 mg by mouth every 8 (eight) hours as needed for nausea or vomiting.    [provider]    Current Medications Scheduled Meds: . allopurinol  100 mg Oral Daily  . amLODipine  5 mg Oral Daily  . calcitonin (salmon)  1 spray Alternating Nares Daily  . calcium-vitamin D  1 tablet Oral BID  . cycloSPORINE modified  50 mg Oral BID  . gabapentin  300 mg Oral BID  . metoprolol succinate  50 mg Oral Daily  . multivitamin with minerals  1 tablet Oral Daily  . sodium chloride flush  3 mL Intravenous Q12H  . vitamin  C  500 mg Oral q morning - 10a   Continuous Infusions: . sodium chloride 75 mL/hr at 10/13/17 2318   PRN Meds:.acetaminophen **OR** acetaminophen, albuterol, bisacodyl, guaiFENesin, magnesium citrate, ondansetron, oxyCODONE, promethazine, senna-docusate  CBC Recent Labs  Lab 10/13/17 1106 10/13/17 1559 10/14/17 0551  WBC 4.3 3.6* 2.5*  NEUTROABS 2.7 2.4  --   HGB 9.0* 9.0* 8.6*  HCT 27.5* 26.7* 25.3*  MCV 94 91.4 91.7  PLT 124* 105* 98*   Basic Metabolic Panel Recent Labs  Lab 10/13/17 1106 10/13/17 1559 10/13/17 2135 10/14/17 0035 10/14/17 0551  NA 140 130* 135 136 136  K 5.3 no visable hemolysis* 6.6* 4.7 4.6 5.0  CL 104 106 108 109 110  CO2 17* 15* 18* 19* 19*  GLUCOSE 116 100* 103* 94 78  BUN 58* 61* 59* 62* 57*  CREATININE 5.9* 5.68* 5.29* 5.32* 5.03*  CALCIUM 8.4 7.9* 8.4* 8.0* 7.8*  PHOS  --   --   --  6.7*  --     Physical Exam  Blood pressure 113/72, pulse 77, temperature 99.3 F (37.4 C), temperature source Oral, resp. rate (!) 22, height 5' (1.524 m), weight 74.2 kg (163 lb 9.3 oz), SpO2 92 %. GEN: NAD, well appearing ENT: NCAT, EYES: EOMI CV: RRR,  normal S1, normal S2, no rub.  No murmur. PULM: Clear bilaterally, no crackles, normal work of breathing ABD: Soft, nontender, nondistended SKIN: No rashes or lesions EXT: No peripheral edema   Assessment 64F with AoCKD3  1. 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 If acute component is hypovolemic, related to diuretic use and poor oral intake; improved from admission.  UA w/o features of GN.  Not uremic.  2. Atrophic R kidney, new since 12/2016 3. Hyperkalemia, resolved since admission with medical management 4. S/o OLT for cryptogenic cirrhosis 1996; IS is cyclosporine 50mg  BID  Plan 1. Continue hydration to treat any hypovolemic/prerenal insult, and will increase normal saline to 125 mL's per hour for the next 24 hours. 2. I am not sure what to make of the new finding of an atrophic right kidney, I will start with an duplex renal ultrasound to evaluate the vasculature.  She has no known atherosclerotic disease.  No hx/o thrombotic disease 3. Continue to hold diuretic. 4. Daily weights, Daily Renal Panel, Strict I/Os, Avoid nephrotoxins (NSAIDs, judicious IV Contrast)   Pearson Grippe MD [phone] pgr 10/14/2017, 10:53 AM

## 2017-10-14 NOTE — Evaluation (Signed)
Physical Therapy Evaluation & discharge Patient Details Name: Tami [name] MRN: [id number] DOB: 1956/10/24 Today's Date: 10/14/2017   History of Present Illness  61 y.o. female with a known history of liver transplant in1996, at [facility], on Neoral, chronic pancytopenia, hypertension, hepatitis, arthritis presents to the emergency department for evaluation of abnormal labs.  Patient was in a usual state of health and at hematologists office for a routine visit when her labs revealed AKI and hyperkalemia.  Clinical Impression  Pt ambulating at baseline level.  No skilled PT services identified.  Will sign off. Please re-order if pt's status changes.  Thank you for the referral.    Follow Up Recommendations No PT follow up    Equipment Recommendations  None recommended by PT    Recommendations for Other Services       Precautions / Restrictions Precautions Precautions: None Restrictions Weight Bearing Restrictions: No      Mobility  Bed Mobility Overal bed mobility: Independent                Transfers Overall transfer level: Independent                  Ambulation/Gait Ambulation/Gait assistance: Modified independent (Device/Increase time) Ambulation Distance (Feet): 200 Feet Assistive device: None Gait Pattern/deviations: Step-through pattern;Wide base of support     General Gait Details: ER hips with lateral sway, but pt reports this is normal gait pattern for her.   Stairs            Wheelchair Mobility    Modified Rankin (Stroke Patients Only)       Balance Overall balance assessment: Modified Independent;No apparent balance deficits (not formally assessed)                                           Pertinent Vitals/Pain Pain Assessment: No/denies pain    Home Living Family/patient expects to be discharged to:: Private residence Living Arrangements: Children Available Help at Discharge: Family   Home Access: Stairs to  enter Entrance Stairs-Rails: Left Entrance Stairs-Number of Steps: 3 Home Layout: One level Home Equipment: Shower seat;Cane - single point;Walker - 2 wheels      Prior Function Level of Independence: Independent               Hand Dominance   Dominant Hand: Right    Extremity/Trunk Assessment   Upper Extremity Assessment Upper Extremity Assessment: Overall WFL for tasks assessed    Lower Extremity Assessment Lower Extremity Assessment: Overall WFL for tasks assessed    Cervical / Trunk Assessment Cervical / Trunk Assessment: Normal  Communication   Communication: No difficulties  Cognition Arousal/Alertness: Awake/alert Behavior During Therapy: WFL for tasks assessed/performed Overall Cognitive Status: Within Functional Limits for tasks assessed                                        General Comments      Exercises     Assessment/Plan    PT Assessment Patent does not need any further PT services  PT Problem List         PT Treatment Interventions      PT Goals (Current goals can be found in the Care Plan section)  Acute Rehab PT Goals PT Goal Formulation: All assessment and education  complete, DC therapy    Frequency     Barriers to discharge        Co-evaluation               AM-PAC PT "6 Clicks" Daily Activity  Outcome Measure Difficulty turning over in bed (including adjusting bedclothes, sheets and blankets)?: None Difficulty moving from lying on back to sitting on the side of the bed? : None Difficulty sitting down on and standing up from a chair with arms (e.g., wheelchair, bedside commode, etc,.)?: None Help needed moving to and from a bed to chair (including a wheelchair)?: None Help needed walking in hospital room?: None Help needed climbing 3-5 steps with a railing? : None 6 Click Score: 24    End of Session Equipment Utilized During Treatment: Gait belt Activity Tolerance: Patient tolerated treatment  well Patient left: in bed Nurse Communication: Mobility status PT Visit Diagnosis: Difficulty in walking, not elsewhere classified (R26.2)    Time: 4373-5789 PT Time Calculation (min) (ACUTE ONLY): 17 min   Charges:   PT Evaluation $PT Eval Low Complexity: 1 Low     PT G Codes:        Tami [name], [place] Pager [phone] 10/14/2017   Tami [name] 10/14/2017, 4:42 PM

## 2017-10-14 NOTE — Progress Notes (Signed)
[facility] PROGRESS NOTE  Tami [name] IHK:[id number] DOB: 08-Mar-1956 DOA: 10/13/2017 PCP: [facility]  Brief summary   61 y.o. female with a known history of liver transplant in1996, at [facility], on Neoral, chronic pancytopenia, hypertension, hepatitis, arthritis presents to the emergency department for evaluation of abnormal labs.  Patient was in a usual state of health until this afternoon when she was at her hematologists office for a routine visit when her labs revealed AKI and hyperkalemia.  The hematology NP spoke with her transplant physician Dr. Dory Horn who thought Neoral MAY be contributing to her acute renal failure, but asked that she be admitted and seen by nephrology for evaluation.    Patient reports a one week history of diarrhea for which she has been eating three bananas a day as well as five days of URI symptoms including cough, congestion, and dizziness.  Diarrhea and URI symptoms mostly resolved at this point.   EMS/ED Course: Patient received bicarb, Kayexalate and Ca Gluconatein the ED. Medical admission has been requested for further management of AKI, hyperkalemia.   Assessment/Plan:  Acute kidney injury on top of CKD I-III. Likely prerenal due to dehydration/diarrhea vs intertial nephritis (recent atx use). Or neoral related. Cr 5.9, up from1.6 in Sept 2018. - Rec'd bicarb, kayexalate, Ca Gluconate, IV fluids. Hyperkalemia ->resolved.  Avoid nephrotoxic medications: hold HCTZ for now. Renal US: No hydronephrosis. Atrophic right kidney.  -renal function is improving with iv fluids, no s/s of fluid overload, will monitor on iv hydration for 24-48 hrs, monitgor urine output, I/O.  D/c Ceftin. Awaiting nephrology eval   Chronic pancytopenia. Rec'd Aranesp at hematology prior to admission. Will hold hepatin due to thrombocytopenia. recheck labs AM - Patient's transplant physician is Dr. Dory Horn at [facility] and was contacted byhematology NP.   History of  hypertension. Stable, Continue Norvasc. BB. Hold HCTZ  Recent URI. Receive ceftin. Lung exam: unremarkable, CXR: Hypoinflation of the lungs with mild bibasilar subsegmental atelectasis. Afebrile. No leukocytosis. D/c ceftin. Cont albuterol prn. Monitor     Code Status: full Family Communication: d/w patient, RN (indicate person spoken with, relationship, and if by phone, the number) Disposition Plan: home in 24-48 hrs. Pend renal function improvement    Consultants:  Nephrology   Procedures:  Renal US  Antibiotics:  none (indicate start date, and stop date if known)  HPI/Subjective: Alert. Denies acute dyspnea, no pains, no nausea, vomiting. No abdominal pains   Objective: Vitals:   10/13/17 2208 10/14/17 0614  BP: 138/67 (!) 104/59  Pulse: 80 79  Resp: (!) 22 (!) 22  Temp: 97.9 F (36.6 C) 99.3 F (37.4 C)  SpO2: 93% 92%    Intake/Output Summary (Last 24 hours) at 10/14/2017 0904 Last data filed at 10/14/2017 0600 Gross per 24 hour  Intake 502.5 ml  Output 300 ml  Net 202.5 ml   Filed Weights   10/13/17 2208  Weight: 74.2 kg (163 lb 9.3 oz)    Exam:   General:  Alert. No distress   Cardiovascular: s1,s2 rrr  Respiratory: CTA BL  Abdomen: soft, nt, nd   Musculoskeletal: no leg edema    Data Reviewed: Basic Metabolic Panel: Recent Labs  Lab 10/13/17 1106 10/13/17 1559 10/13/17 2135 10/14/17 0035 10/14/17 0551  NA 140 130* 135 136 136  K 5.3 no visable hemolysis* 6.6* 4.7 4.6 5.0  CL 104 106 108 109 110  CO2 17* 15* 18* 19* 19*  GLUCOSE 116 100* 103* 94 78  BUN 58* 61* 59*  62* 57*  CREATININE 5.9* 5.68* 5.29* 5.32* 5.03*  CALCIUM 8.4 7.9* 8.4* 8.0* 7.8*  MG  --   --   --  1.9  --   PHOS  --   --   --  6.7*  --    Liver Function Tests: Recent Labs  Lab 10/13/17 1106 10/13/17 1559 10/14/17 0551  AST 18 30 19   ALT 15 6* 10*  ALKPHOS 68 58 54  BILITOT 0.50 0.6 0.6  PROT 7.7 8.0 6.7  ALBUMIN 3.3 3.5 2.9*   No results for  input(s): LIPASE, AMYLASE in the last 168 hours. No results for input(s): AMMONIA in the last 168 hours. CBC: Recent Labs  Lab 10/13/17 1106 10/13/17 1559 10/14/17 0551  WBC 4.3 3.6* 2.5*  NEUTROABS 2.7 2.4  --   HGB 9.0* 9.0* 8.6*  HCT 27.5* 26.7* 25.3*  MCV 94 91.4 91.7  PLT 124* 105* 98*   Cardiac Enzymes: No results for input(s): CKTOTAL, CKMB, CKMBINDEX, TROPONINI in the last 168 hours. BNP (last 3 results) No results for input(s): BNP in the last 8760 hours.  ProBNP (last 3 results) No results for input(s): PROBNP in the last 8760 hours.  CBG: No results for input(s): GLUCAP in the last 168 hours.  No results found for this or any previous visit (from the past 240 hour(s)).   Studies: Dg Chest 2 View  Result Date: 10/13/2017 CLINICAL DATA:  Cough. EXAM: CHEST  2 VIEW COMPARISON:  Radiographs of March 24, 2016. FINDINGS: Stable cardiomediastinal silhouette. Atherosclerosis of thoracic aorta is noted. Hypoinflation of the lungs is noted with mild bibasilar subsegmental atelectasis. No pneumothorax or pleural effusion is noted. Bony thorax is unremarkable. IMPRESSION: Hypoinflation of the lungs with mild bibasilar subsegmental atelectasis. Electronically Signed   By: Marijo Conception, M.D.   On: 10/13/2017 17:19   US Renal  Result Date: 10/13/2017 CLINICAL DATA:  Acute renal failure. EXAM: RENAL / URINARY TRACT ULTRASOUND COMPLETE COMPARISON:  None. FINDINGS: Right Kidney: Length: 5.5 cm.  Poorly visualized.  No gross abnormality. Left Kidney: Length: 8.5 cm. Mild increased cortical echogenicity and mild cortical thinning. No mass or hydronephrosis visualized. Bladder: Minimal dependent echogenic debris. IMPRESSION: Atrophic poorly visualized right kidney. Left kidney at the lower limits of normal in size demonstrating findings which can be seen with medical renal disease. No hydronephrosis. Electronically Signed   By: Marin Olp M.D.   On: 10/13/2017 21:47    Scheduled  Meds: . allopurinol  100 mg Oral Daily  . amLODipine  5 mg Oral Daily  . calcitonin (salmon)  1 spray Alternating Nares Daily  . calcium-vitamin D  1 tablet Oral BID  . cefUROXime  500 mg Oral BID  . cycloSPORINE modified  50 mg Oral BID  . gabapentin  300 mg Oral BID  . heparin  5,000 Units Subcutaneous Q8H  . metoprolol succinate  50 mg Oral Daily  . multivitamin with minerals  1 tablet Oral Daily  . sodium chloride flush  3 mL Intravenous Q12H  . vitamin C  500 mg Oral q morning - 10a   Continuous Infusions: . sodium chloride 75 mL/hr at 10/13/17 2318    Active Problems:   Acute kidney injury [facility])    Time spent: >35 minutes     Kinnie Feil  [facility] Pager [phone]. If 7PM-7AM, please contact night-coverage at [email or web address], password [email or web address] 10/14/2017, 9:04 AM  LOS: 1 day

## 2017-10-15 DIAGNOSIS — N261 Atrophy of kidney (terminal): Secondary | ICD-10-CM

## 2017-10-15 DIAGNOSIS — I10 Essential (primary) hypertension: Secondary | ICD-10-CM

## 2017-10-15 DIAGNOSIS — E875 Hyperkalemia: Secondary | ICD-10-CM

## 2017-10-15 DIAGNOSIS — N179 Acute kidney failure, unspecified: Secondary | ICD-10-CM

## 2017-10-15 LAB — BASIC METABOLIC PANEL
Anion gap: 7 (ref 5–15)
BUN: 52 mg/dL — AB (ref 6–20)
CHLORIDE: 114 mmol/L — AB (ref 101–111)
CO2: 20 mmol/L — AB (ref 22–32)
CREATININE: 3.97 mg/dL — AB (ref 0.44–1.00)
Calcium: 7.5 mg/dL — ABNORMAL LOW (ref 8.9–10.3)
GFR calc Af Amer: 13 mL/min — ABNORMAL LOW (ref >60)
GFR calc non Af Amer: 11 mL/min — ABNORMAL LOW (ref >60)
Glucose, Bld: 78 mg/dL (ref 65–99)
POTASSIUM: 4.3 mmol/L (ref 3.5–5.1)
Sodium: 141 mmol/L (ref 135–145)

## 2017-10-15 LAB — CBC
HEMATOCRIT: 25.7 % — AB (ref 36.0–46.0)
Hemoglobin: 8.6 g/dL — ABNORMAL LOW (ref 12.0–15.0)
MCH: 30.9 pg (ref 26.0–34.0)
MCHC: 33.5 g/dL (ref 30.0–36.0)
MCV: 92.4 fL (ref 78.0–100.0)
PLATELETS: 70 10*3/uL — AB (ref 150–400)
RBC: 2.78 MIL/uL — ABNORMAL LOW (ref 3.87–5.11)
RDW: 13.7 % (ref 11.5–15.5)
WBC: 2.5 10*3/uL — AB (ref 4.0–10.5)

## 2017-10-15 MED ORDER — SODIUM CHLORIDE 0.9 % IV SOLN
INTRAVENOUS | Status: DC
  Administered 2017-10-15: 12:00:00 via INTRAVENOUS

## 2017-10-15 MED ORDER — SODIUM CHLORIDE 0.9 % IV SOLN: INTRAVENOUS | Status: AC

## 2017-10-15 NOTE — Progress Notes (Signed)
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  Patient had her labs done at hematology office for a routine visit when her labs revealed AK I and hyperkalemia.  As per prior notes,the hematology NP spoke with her transplant physician Dr. Dory Horn who thought Neoral MAY be contributing to her acute renal failure, but asked that she be admitted and seen by nephrology for evaluation.   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  The acute worsening in renal failure likely due to hypovolemia in the setting of diuretic use and poor oral intake.  Serum creatinine level improving with IV fluid.  Patient is not uremic and nonoliguric.  UA unremarkable.  Ultrasound showed right atrophic kidney which is new.  Plan for Doppler ultrasound today.  Nephrology consult appreciated.  Since patient is eating, plan to discontinue IV fluid.  Hold diuretics.  #Hyperkalemia: Improved.  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  Continue for now and follow-up outpatient after discharge.  #Thrombocytopenia: Platelets are dropping.  No sign of bleeding.  Not on anticoagulation.  Monitor CBC.  #Hypertension: Continue amlodipine, metoprolol.  Monitor blood pressure.  DVT prophylaxis: Patient has thrombocytopenia.  Continue SCD.  No anticoagulation Code Status: Full code Family Communication: No family at bedside Disposition Plan: Likely discharge home in 1-2 days    Consultants:   Nephrology  Procedures: None Antimicrobials: None  Subjective: Seen and examined at bedside.   Patient denied nausea vomiting chest pain shortness of breath.  Objective: Vitals:   10/14/17 2117 10/15/17 0446 10/15/17 0924 10/15/17 1313  BP: 123/84 106/62 122/70 119/72  Pulse: 82 80 80 81  Resp: 18 18    Temp: 98 F (36.7 C) 98.8 F (37.1 C)  98.6 F (37 C)  TempSrc: Oral Oral  Oral  SpO2: 98% (!) 82%  97%  Weight:      Height:        Intake/Output Summary (Last 24 hours) at 10/15/2017 1551 Last data filed at 10/15/2017 0600 Gross per 24 hour  Intake 1875 ml  Output 1600 ml  Net 275 ml   Filed Weights   10/13/17 2208  Weight: 74.2 kg (163 lb 9.3 oz)    Examination:  General exam: Appears calm and comfortable  Respiratory system: Clear to auscultation. Respiratory effort normal. No wheezing or crackle Cardiovascular system: S1 & S2 heard, RRR.  No pedal edema. Gastrointestinal system: Abdomen is nondistended, soft and nontender. Normal bowel sounds heard. Central nervous system: Alert and oriented. No focal neurological deficits. Extremities: Symmetric 5 x 5 power. Skin: No rashes, lesions or ulcers Psychiatry: Judgement and insight appear normal. Mood & affect appropriate.     Data Reviewed: I have personally reviewed following labs and imaging studies  CBC: Recent Labs  Lab 10/13/17 1106 10/13/17 1559 10/14/17 0551 10/15/17 0511  WBC 4.3 3.6* 2.5* 2.5*  NEUTROABS 2.7 2.4  --   --   HGB 9.0* 9.0* 8.6* 8.6*  HCT 27.5* 26.7* 25.3* 25.7*  MCV 94 91.4 91.7 92.4  PLT 124* 105* 98* 70*   Basic Metabolic Panel: Recent Labs  Lab 10/13/17 1559 10/13/17 2135 10/14/17 0035 10/14/17 0551 10/15/17  0511  NA 130* 135 136 136 141  K 6.6* 4.7 4.6 5.0 4.3  CL 106 108 109 110 114*  CO2 15* 18* 19* 19* 20*  GLUCOSE 100* 103* 94 78 78  BUN 61* 59* 62* 57* 52*  CREATININE 5.68* 5.29* 5.32* 5.03* 3.97*  CALCIUM 7.9* 8.4* 8.0* 7.8* 7.5*  MG  --   --  1.9  --   --   PHOS  --   --  6.7*  --   --    GFR: Estimated Creatinine Clearance: 13.2 mL/min (A) (by C-G  formula based on SCr of 3.97 mg/dL (H)). Liver Function Tests: Recent Labs  Lab 10/13/17 1106 10/13/17 1559 10/14/17 0551  AST 18 30 19   ALT 15 6* 10*  ALKPHOS 68 58 54  BILITOT 0.50 0.6 0.6  PROT 7.7 8.0 6.7  ALBUMIN 3.3 3.5 2.9*   No results for input(s): LIPASE, AMYLASE in the last 168 hours. No results for input(s): AMMONIA in the last 168 hours. Coagulation Profile: No results for input(s): INR, PROTIME in the last 168 hours. Cardiac Enzymes: No results for input(s): CKTOTAL, CKMB, CKMBINDEX, TROPONINI in the last 168 hours. BNP (last 3 results) No results for input(s): PROBNP in the last 8760 hours. HbA1C: No results for input(s): HGBA1C in the last 72 hours. CBG: No results for input(s): GLUCAP in the last 168 hours. Lipid Profile: No results for input(s): CHOL, HDL, LDLCALC, TRIG, CHOLHDL, LDLDIRECT in the last 72 hours. Thyroid Function Tests: No results for input(s): TSH, T4TOTAL, FREET4, T3FREE, THYROIDAB in the last 72 hours. Anemia Panel: Recent Labs    10/13/17 1106  FERRITIN 710*  TIBC 174*  IRON 90   Sepsis Labs: No results for input(s): PROCALCITON, LATICACIDVEN in the last 168 hours.  No results found for this or any previous visit (from the past 240 hour(s)).       Radiology Studies: Dg Chest 2 View  Result Date: 10/13/2017 CLINICAL DATA:  Cough. EXAM: CHEST  2 VIEW COMPARISON:  Radiographs of March 24, 2016. FINDINGS: Stable cardiomediastinal silhouette. Atherosclerosis of thoracic aorta is noted. Hypoinflation of the lungs is noted with mild bibasilar subsegmental atelectasis. No pneumothorax or pleural effusion is noted. Bony thorax is unremarkable. IMPRESSION: Hypoinflation of the lungs with mild bibasilar subsegmental atelectasis. Electronically Signed   By: Marijo Conception, M.D.   On: 10/13/2017 17:19   US Renal  Result Date: 10/13/2017 CLINICAL DATA:  Acute renal failure. EXAM: RENAL / URINARY TRACT ULTRASOUND COMPLETE COMPARISON:  None.  FINDINGS: Right Kidney: Length: 5.5 cm.  Poorly visualized.  No gross abnormality. Left Kidney: Length: 8.5 cm. Mild increased cortical echogenicity and mild cortical thinning. No mass or hydronephrosis visualized. Bladder: Minimal dependent echogenic debris. IMPRESSION: Atrophic poorly visualized right kidney. Left kidney at the lower limits of normal in size demonstrating findings which can be seen with medical renal disease. No hydronephrosis. Electronically Signed   By: Marin Olp M.D.   On: 10/13/2017 21:47        Scheduled Meds: . allopurinol  100 mg Oral Daily  . amLODipine  5 mg Oral Daily  . calcitonin (salmon)  1 spray Alternating Nares Daily  . calcium-vitamin D  1 tablet Oral BID  . cycloSPORINE modified  50 mg Oral BID  . gabapentin  300 mg Oral BID  . metoprolol succinate  50 mg Oral Daily  . multivitamin with minerals  1 tablet Oral Daily  . sodium chloride flush  3 mL Intravenous Q12H  .  vitamin C  500 mg Oral q morning - 10a   Continuous Infusions: . sodium chloride 100 mL/hr at 10/15/17 1136     LOS: 2 days    Dron Tanna Furry, MD [facility] Pager [phone]  If 7PM-7AM, please contact night-coverage [email or web address] Password [facility] 10/15/2017, 3:51 PM

## 2017-10-15 NOTE — Progress Notes (Signed)
Admit: 10/13/2017 LOS: 2  Tami [name] with AoCKD3, improving  Subjective:  SCr improved Great UOP Feels well For Renal Duplex study today    12/22 0701 - 12/23 0700 In: 2695 [I.V.:2695] Out: 2100 [Urine:2100]  Filed Weights   10/13/17 2208  Weight: 74.2 kg (163 lb 9.3 oz)    Scheduled Meds: . allopurinol  100 mg Oral Daily  . amLODipine  5 mg Oral Daily  . calcitonin (salmon)  1 spray Alternating Nares Daily  . calcium-vitamin D  1 tablet Oral BID  . cycloSPORINE modified  50 mg Oral BID  . gabapentin  300 mg Oral BID  . metoprolol succinate  50 mg Oral Daily  . multivitamin with minerals  1 tablet Oral Daily  . sodium chloride flush  3 mL Intravenous Q12H  . vitamin C  500 mg Oral q morning - 10a   Continuous Infusions: . sodium chloride 100 mL/hr at 10/15/17 1136   PRN Meds:.acetaminophen **OR** acetaminophen, albuterol, bisacodyl, guaiFENesin, magnesium citrate, ondansetron, oxyCODONE, promethazine, senna-docusate  Current Labs: reviewed  12/22 Renal Duplex: pending   Physical Exam:  Blood pressure 119/72, pulse 81, temperature 98.6 F (37 C), temperature source Oral, resp. rate 18, height 5' (1.524 m), weight 74.2 kg (163 lb 9.3 oz), SpO2 97 %. GEN: NAD, well appearing ENT: NCAT, EYES: EOMI CV: RRR, normal S1, normal S2, no rub.  No murmur. PULM: Clear bilaterally, no crackles, normal work of breathing ABD: Soft, nontender, nondistended SKIN: No rashes or lesions EXT: No peripheral edema   A 1. 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 If acute component is hypovolemic, related to diuretic use and poor oral intake; improved from admission.  UA w/o features of GN.  Not uremic.  2. Atrophic R kidney, new since 12/2016 3. Hyperkalemia, resolved since admission with medical management 4. S/o OLT for cryptogenic cirrhosis 1996; IS is cyclosporine 50mg  BID  P 1. Would not continue IVFs after they expire (was for 24h) then let her eat and  drink 2. Do not restart HCTZ for now 3. No change to CyA dosing 4. Await Duplex 5. Daily weights, Daily Renal Panel, Strict I/Os, Avoid nephrotoxins (NSAIDs, judicious IV Contrast)  6. Will f/u with me upon discharge in the office; will go ahead and make appt   Pearson Grippe MD 10/15/2017, 2:07 PM  Recent Labs  Lab 10/14/17 0035 10/14/17 0551 10/15/17 0511  NA 136 136 141  K 4.6 5.0 4.3  CL 109 110 114*  CO2 19* 19* 20*  GLUCOSE 94 78 78  BUN 62* 57* 52*  CREATININE 5.32* 5.03* 3.97*  CALCIUM 8.0* 7.8* 7.5*  PHOS 6.7*  --   --    Recent Labs  Lab 10/13/17 1106 10/13/17 1559 10/14/17 0551 10/15/17 0511  WBC 4.3 3.6* 2.5* 2.5*  NEUTROABS 2.7 2.4  --   --   HGB 9.0* 9.0* 8.6* 8.6*  HCT 27.5* 26.7* 25.3* 25.7*  MCV 94 91.4 91.7 92.4  PLT 124* 105* 98* 70*

## 2017-10-16 ENCOUNTER — Inpatient Hospital Stay (HOSPITAL_COMMUNITY): Payer: Medicaid Other

## 2017-10-16 DIAGNOSIS — N261 Atrophy of kidney (terminal): Secondary | ICD-10-CM

## 2017-10-16 LAB — CBC
HEMATOCRIT: 26 % — AB (ref 36.0–46.0)
Hemoglobin: 8.7 g/dL — ABNORMAL LOW (ref 12.0–15.0)
MCH: 31.2 pg (ref 26.0–34.0)
MCHC: 33.5 g/dL (ref 30.0–36.0)
MCV: 93.2 fL (ref 78.0–100.0)
PLATELETS: 104 10*3/uL — AB (ref 150–400)
RBC: 2.79 MIL/uL — ABNORMAL LOW (ref 3.87–5.11)
RDW: 13.8 % (ref 11.5–15.5)
WBC: 2.8 10*3/uL — ABNORMAL LOW (ref 4.0–10.5)

## 2017-10-16 LAB — RENAL FUNCTION PANEL
Albumin: 2.8 g/dL — ABNORMAL LOW (ref 3.5–5.0)
Anion gap: 7 (ref 5–15)
BUN: 38 mg/dL — AB (ref 6–20)
CHLORIDE: 115 mmol/L — AB (ref 101–111)
CO2: 20 mmol/L — AB (ref 22–32)
Calcium: 7.8 mg/dL — ABNORMAL LOW (ref 8.9–10.3)
Creatinine, Ser: 3.29 mg/dL — ABNORMAL HIGH (ref 0.44–1.00)
GFR calc Af Amer: 16 mL/min — ABNORMAL LOW (ref >60)
GFR, EST NON AFRICAN AMERICAN: 14 mL/min — AB (ref >60)
Glucose, Bld: 81 mg/dL (ref 65–99)
POTASSIUM: 4.1 mmol/L (ref 3.5–5.1)
Phosphorus: 3.5 mg/dL (ref 2.5–4.6)
Sodium: 142 mmol/L (ref 135–145)

## 2017-10-16 NOTE — Discharge Summary (Signed)
Physician Discharge Summary  Tami [name] WFU:[id number] DOB: [birth year] DOA: 10/13/2017  PCP: [facility]  Admit date: 10/13/2017 Discharge date: 10/16/2017  Admitted From:home Disposition:home  Recommendations for Outpatient Follow-up:  1. Follow up with PCP in 1-2 weeks 2. 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  Patient had her labs done at hematology office for a routine visit when her labs revealed AK I and hyperkalemia.  As per prior notes,the hematology NP spoke with her transplant physician Dr. Dory Horn who thought Neoral MAY be contributing to her acute renal failure, but asked that she be admitted and seen by nephrology for evaluation.  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  The acute worsening in renal failure likely due to hypovolemia in the setting of diuretic use and poor oral intake.    Ultrasound showed right atrophic kidney which is new.    Doppler ultrasound with poor quality as per preliminary report.  Serum creatinine level significantly improved.  Off diuretics.  Patient denies nausea vomiting or abdominal pain.  Tolerating diet well.  She understand to follow-up with PCP and nephrologist outpatient.  She is asking to go home today.  -She was evaluated by nephrologist in the hospital.  I recommended patient to follow-up outpatient.  #Hyperkalemia: Improved.  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  Continue for now and follow-up outpatient after discharge.  #Thrombocytopenia:  Platelet count improving.  No sign of bleeding.  #Hypertension: Continue amlodipine, metoprolol.  Monitor blood pressure  She is clinically stable.  Serum creatinine level continued to improve.  She is nonoliguric.  She has no sign of uremia.  Wanted to go home.  Renal artery duplex not clearly identified right kidney, obscured by gas.  Recommended outpatient follow-up with a nephrologist.   Discharge Diagnoses:  Active Problems:   Acute kidney injury (HCC)   Acute renal failure (HCC)   Hyperkalemia   Renal atrophy, right    Discharge Instructions  Discharge Instructions    Call MD for:  difficulty breathing, headache or visual disturbances   Complete by:  As directed    Call MD for:  extreme fatigue   Complete by:  As directed    Call MD for:  hives   Complete by:  As directed    Call MD for:  persistant dizziness or light-headedness   Complete by:  As directed    Call MD for:  persistant nausea and vomiting   Complete by:  As directed    Call MD for:  severe uncontrolled pain   Complete by:  As directed    Call MD for:  temperature >100.4   Complete by:  As directed    Diet - low sodium heart healthy   Complete by:  As directed    Discharge instructions   Complete by:  As directed    Please follow-up with your PCP in a week and check lab including BMP.  Please follow-up with kidney doctor in 1-2 weeks.   Increase activity slowly   Complete by:  As directed      Allergies as of 10/16/2017      Reactions   Iodinated Diagnostic Agents  Ioxaglate    Latex       Medication List    STOP taking these medications   azithromycin 250 MG tablet Commonly known as:  ZITHROMAX Z-PAK   cefdinir 300 MG capsule Commonly known as:  OMNICEF   hydrochlorothiazide 12.5 MG tablet Commonly known as:  HYDRODIURIL     TAKE these medications   albuterol 108 (90 Base) MCG/ACT inhaler Commonly known as:  PROVENTIL HFA;VENTOLIN HFA Inhale into the lungs.   alendronate 70 MG  tablet Commonly known as:  FOSAMAX Take 70 mg by mouth once a week.   allopurinol 100 MG tablet Commonly known as:  ZYLOPRIM Take 100 mg by mouth daily.   amLODipine 5 MG tablet Commonly known as:  NORVASC Take 5 mg by mouth daily.   calcitonin (salmon) 200 UNIT/ACT nasal spray Commonly known as:  MIACALCIN/FORTICAL Place 1 spray into alternate nostrils daily.   CALCIUM 1200 PO Take 1 tablet by mouth 2 (two) times daily.   gabapentin 300 MG capsule Commonly known as:  NEURONTIN Take 1 capsule (300 mg total) by mouth 2 (two) times daily.   metoprolol succinate 50 MG 24 hr tablet Commonly known as:  TOPROL-XL Take 50 mg by mouth daily.   multivitamin with minerals tablet Take 1 tablet by mouth daily.   NEORAL 25 MG capsule Generic drug:  cycloSPORINE modified Take 50 mg by mouth 2 (two) times daily.   ondansetron 4 MG disintegrating tablet Commonly known as:  ZOFRAN-ODT Take 4 mg by mouth every 8 (eight) hours as needed for nausea or vomiting.   Oxycodone HCl 10 MG Tabs Take 10 mg by mouth 4 (four) times daily as needed (pain).   promethazine 12.5 MG tablet Commonly known as:  PHENERGAN Take 1 tablet (12.5 mg total) by mouth every 6 (six) hours as needed. What changed:  reasons to take this   Vitamin C 250 MG Chew Chew 1 tablet by mouth every morning.   Vitamin D (Ergocalciferol) 50000 units Caps capsule Commonly known as:  DRISDOL Take 50,000 Units by mouth every 14 (fourteen) days.   VOLTAREN 1 % Gel Generic drug:  diclofenac sodium Note:Apply 4 grams to painful knees, up to 4 times daily, as needed for pain      Follow-up [place]. Schedule an appointment as soon as possible for a visit in 1 week(s).   Contact information: [place] [address] [phone]        Rexene Agent, MD. Schedule an appointment as soon as possible for a visit in 2 week(s).   Specialty:  Nephrology Contact  information: [place] [address] [phone]          Allergies  Allergen Reactions  . Iodinated Diagnostic Agents   . Ioxaglate   . Latex     Consultations: Nephrologist  Procedures/Studies: Ultrasound  Subjective: Seen and examined at bedside.  Wanted to go home today.  Denies headache, dizziness, nausea, vomiting, chest pain, shortness of breath, abdominal pain.  Denies dysuria urgency or frequency.  Discharge Exam: Vitals:   10/15/17 1900 10/16/17 0524  BP: 102/61 139/81  Pulse: 90 89  Resp: 18 17  Temp: 99 F (37.2 C) 98.2 F (36.8 C)  SpO2: 98% 99%   Vitals:   10/15/17 0924 10/15/17 1313 10/15/17 1900 10/16/17 0524  BP: 122/70 119/72 102/61 139/81  Pulse: 80 81 90 89  Resp:   18 17  Temp:  98.6 F (  37 C) 99 F (37.2 C) 98.2 F (36.8 C)  TempSrc:  Oral Oral Axillary  SpO2:  97% 98% 99%  Weight:      Height:        General: Pt is alert, awake, not in acute distress Cardiovascular: RRR, S1/S2 +, no rubs, no gallops Respiratory: CTA bilaterally, no wheezing, no rhonchi Abdominal: Soft, NT, ND, bowel sounds + Extremities: no edema, no cyanosis    The results of significant diagnostics from this hospitalization (including imaging, microbiology, ancillary and laboratory) are listed below for reference.     Microbiology: No results found for this or any previous visit (from the past 240 hour(s)).   Labs: BNP (last 3 results) No results for input(s): BNP in the last 8760 hours. Basic Metabolic Panel: Recent Labs  Lab 10/13/17 2135 10/14/17 0035 10/14/17 0551 10/15/17 0511 10/16/17 0452  NA 135 136 136 141 142  K 4.7 4.6 5.0 4.3 4.1  CL 108 109 110 114* 115*  CO2 18* 19* 19* 20* 20*  GLUCOSE 103* 94 78 78 81  BUN 59* 62* 57* 52* 38*  CREATININE 5.29* 5.32* 5.03* 3.97* 3.29*  CALCIUM 8.4* 8.0* 7.8* 7.5* 7.8*  MG  --  1.9  --   --   --   PHOS  --  6.7*  --   --  3.5   Liver Function Tests: Recent Labs  Lab  10/13/17 1106 10/13/17 1559 10/14/17 0551 10/16/17 0452  AST 18 30 19   --   ALT 15 6* 10*  --   ALKPHOS 68 58 54  --   BILITOT 0.50 0.6 0.6  --   PROT 7.7 8.0 6.7  --   ALBUMIN 3.3 3.5 2.9* 2.8*   No results for input(s): LIPASE, AMYLASE in the last 168 hours. No results for input(s): AMMONIA in the last 168 hours. CBC: Recent Labs  Lab 10/13/17 1106 10/13/17 1559 10/14/17 0551 10/15/17 0511 10/16/17 0452  WBC 4.3 3.6* 2.5* 2.5* 2.8*  NEUTROABS 2.7 2.4  --   --   --   HGB 9.0* 9.0* 8.6* 8.6* 8.7*  HCT 27.5* 26.7* 25.3* 25.7* 26.0*  MCV 94 91.4 91.7 92.4 93.2  PLT 124* 105* 98* 70* 104*   Cardiac Enzymes: No results for input(s): CKTOTAL, CKMB, CKMBINDEX, TROPONINI in the last 168 hours. BNP: Invalid input(s): POCBNP CBG: No results for input(s): GLUCAP in the last 168 hours. D-Dimer No results for input(s): DDIMER in the last 72 hours. Hgb A1c No results for input(s): HGBA1C in the last 72 hours. Lipid Profile No results for input(s): CHOL, HDL, LDLCALC, TRIG, CHOLHDL, LDLDIRECT in the last 72 hours. Thyroid function studies No results for input(s): TSH, T4TOTAL, T3FREE, THYROIDAB in the last 72 hours.  Invalid input(s): FREET3 Anemia work up Recent Labs    10/13/17 1106  FERRITIN 710*  TIBC 174*  IRON 90   Urinalysis    Component Value Date/Time   COLORURINE YELLOW 10/13/2017 1610   APPEARANCEUR HAZY (A) 10/13/2017 1610   LABSPEC 1.008 10/13/2017 1610   PHURINE 5.0 10/13/2017 1610   GLUCOSEU NEGATIVE 10/13/2017 1610   HGBUR NEGATIVE 10/13/2017 1610   BILIRUBINUR NEGATIVE 10/13/2017 1610   [place] 10/13/2017 1610   PROTEINUR NEGATIVE 10/13/2017 1610   NITRITE NEGATIVE 10/13/2017 1610   LEUKOCYTESUR SMALL (A) 10/13/2017 1610   Sepsis Labs Invalid input(s): PROCALCITONIN,  WBC,  LACTICIDVEN Microbiology No results found for this or any previous visit (from the past 240 hour(s)).   Time coordinating  discharge: 30  minutes  SIGNED:   Rosita Fire, MD  [facility] 10/16/2017, 10:56 AM  If 7PM-7AM, please contact night-coverage [email or web address] Password [email or web address]

## 2017-10-16 NOTE — Progress Notes (Signed)
*  PRELIMINARY RESULTS* Vascular Ultrasound Renal Artery Duplex has been completed.  Preliminary findings: Very limited exam due to large amounts of bowel and gas, patient body habitus and penetration. Right kidney not clearly identified. Midline evaluation obscured by gas.  No renal artery stenosis identified on the left.  Tami [name] 10/16/2017, 9:20 AM

## 2017-10-27 ENCOUNTER — Other Ambulatory Visit: Payer: Self-pay | Admitting: Family

## 2017-10-27 DIAGNOSIS — R110 Nausea: Secondary | ICD-10-CM

## 2017-10-27 DIAGNOSIS — K754 Autoimmune hepatitis: Secondary | ICD-10-CM

## 2017-10-27 DIAGNOSIS — D61818 Other pancytopenia: Secondary | ICD-10-CM

## 2017-10-27 DIAGNOSIS — N189 Chronic kidney disease, unspecified: Secondary | ICD-10-CM

## 2017-10-27 DIAGNOSIS — J0111 Acute recurrent frontal sinusitis: Secondary | ICD-10-CM

## 2017-10-27 DIAGNOSIS — D631 Anemia in chronic kidney disease: Secondary | ICD-10-CM

## 2017-10-27 DIAGNOSIS — G4701 Insomnia due to medical condition: Secondary | ICD-10-CM

## 2017-10-27 MED ORDER — AZITHROMYCIN 250 MG PO TABS: ORAL_TABLET | ORAL | 0 refills | Status: DC

## 2017-10-27 MED ORDER — PROMETHAZINE HCL 12.5 MG PO TABS: 12.5000 mg | ORAL_TABLET | Freq: Three times a day (TID) | ORAL | 0 refills | Status: DC | PRN

## 2017-10-27 NOTE — Unmapped (Signed)
[facility] Refill and Clinical Coordination Note  Medication(s): NEORAL    Katherine Norton, DOB: 04/27/1956  Phone: [phone] (home) , Alternate phone contact: N/A  Shipping address: [address]  [place] [address]  Phone or address changes today?: No  All above HIPAA information verified.  Insurance changes? No    Completed refill and clinical call assessment today to schedule patient's medication shipment from the [facility] [phone]).      MEDICATION RECONCILIATION    Confirmed the medication and dosage are correct and have not changed: Yes, regimen is correct and unchanged.    Were there any changes to your medication(s) in the past month:  No, there are no changes reported at this time.    ADHERENCE    Is this medicine transplant or covered by Medicare Part B? Yes.    Neoral 25 mg   Quantity filled last month: 120   # of tablets left on hand: 40      Did you miss any doses in the past 4 weeks? No missed doses reported.  Adherence counseling provided? Not needed     SIDE EFFECT MANAGEMENT    Are you tolerating your medication?:  Katherine Norton reports tolerating the medication.  Side effect management discussed: None      Therapy is appropriate and should be continued.        FINANCIAL/SHIPPING    Delivery Scheduled: Yes, Expected medication delivery date: 11/01/17   Additional medications refilled: No additional medications/refills needed at this time.    Katherine Norton did not have any additional questions at this time.    Delivery address validated in [facility] scheduling system: Yes, address listed above is correct.      We will follow up with patient monthly for standard refill processing and delivery.      Thank you,  Thad Ranger   [facility] Specialty Pharmacist

## 2017-10-31 MED FILL — NEORAL/25MG/CAP: NEORAL/25MG/CAP | 30 days supply | Qty: 120 | Fill #9

## 2017-11-03 NOTE — Unmapped (Signed)
Confirmed with pharmacist [facility] that Cefdinir 300 mg daily is safe for patient left VM making patient aware.  Confirmed with patient she can call [facility] clinic to schedule a nephrologist apt.

## 2017-11-15 LAB — COMPREHENSIVE METABOLIC PANEL
A/G RATIO: 1 — ABNORMAL LOW (ref 1.2–2.2)
ALKALINE PHOSPHATASE: 61 IU/L (ref 39–117)
ALT (SGPT): 16 IU/L (ref 0–32)
AST (SGOT): 20 IU/L (ref 0–40)
BILIRUBIN TOTAL: 0.3 mg/dL (ref 0.0–1.2)
BLOOD UREA NITROGEN: 39 mg/dL — ABNORMAL HIGH (ref 8–27)
BUN / CREAT RATIO: 20 (ref 12–28)
CHLORIDE: 104 mmol/L (ref 96–106)
GFR MDRD AF AMER: 32 mL/min/{1.73_m2} — ABNORMAL LOW
GFR MDRD NON AF AMER: 28 mL/min/{1.73_m2} — ABNORMAL LOW
GLOBULIN, TOTAL: 4.2 g/dL (ref 1.5–4.5)
GLUCOSE: 119 mg/dL — ABNORMAL HIGH (ref 65–99)
POTASSIUM: 4.5 mmol/L (ref 3.5–5.2)
SODIUM: 142 mmol/L (ref 134–144)
TOTAL PROTEIN: 8.2 g/dL (ref 6.0–8.5)

## 2017-11-15 LAB — CBC W/ DIFFERENTIAL
BANDED NEUTROPHILS ABSOLUTE COUNT: 0.3 10*3/uL — ABNORMAL HIGH (ref 0.0–0.1)
BASOPHILS ABSOLUTE COUNT: 0 10*3/uL (ref 0.0–0.2)
BASOPHILS RELATIVE PERCENT: 0 %
EOSINOPHILS RELATIVE PERCENT: 1 %
HEMATOCRIT: 31 % — ABNORMAL LOW (ref 34.0–46.6)
HEMOGLOBIN: 10.1 g/dL — ABNORMAL LOW (ref 11.1–15.9)
IMMATURE GRANULOCYTES: 3 %
LYMPHOCYTES ABSOLUTE COUNT: 1.8 10*3/uL (ref 0.7–3.1)
LYMPHOCYTES RELATIVE PERCENT: 19 %
MEAN CORPUSCULAR HEMOGLOBIN CONC: 32.6 g/dL (ref 31.5–35.7)
MEAN CORPUSCULAR HEMOGLOBIN: 29.9 pg (ref 26.6–33.0)
MEAN CORPUSCULAR VOLUME: 92 fL (ref 79–97)
MONOCYTES ABSOLUTE COUNT: 0.6 10*3/uL (ref 0.1–0.9)
MONOCYTES RELATIVE PERCENT: 6 %
NEUTROPHILS ABSOLUTE COUNT: 6.8 10*3/uL (ref 1.4–7.0)
NEUTROPHILS RELATIVE PERCENT: 71 %
RED BLOOD CELL COUNT: 3.38 x10E6/uL — ABNORMAL LOW (ref 3.77–5.28)
RED CELL DISTRIBUTION WIDTH: 15.5 % — ABNORMAL HIGH (ref 12.3–15.4)
WHITE BLOOD CELL COUNT: 9.6 10*3/uL (ref 3.4–10.8)

## 2017-11-15 LAB — GAMMA GLUTAMYL TRANSFERASE: Lab: 16

## 2017-11-15 LAB — PHOSPHORUS, SERUM: Lab: 5.7 — ABNORMAL HIGH

## 2017-11-15 LAB — BILIRUBIN DIRECT: Lab: 0.13

## 2017-11-15 LAB — NEUTROPHILS ABSOLUTE COUNT: Lab: 6.8

## 2017-11-15 LAB — MAGNESIUM: Lab: 1.1 — ABNORMAL LOW

## 2017-11-15 LAB — BLOOD UREA NITROGEN: Lab: 39 — ABNORMAL HIGH

## 2017-11-15 NOTE — Unmapped (Signed)
Spoke with NP Martin-Velez about plan for nephrology apt. In February and creatinine of 1.91.  Stated to just encourage fluids.  Patient verbalized understanding.

## 2017-11-17 LAB — CYCLOSPORINE, LC-MS/MS: Lab: 47 — ABNORMAL LOW

## 2017-11-21 NOTE — Unmapped (Signed)
Per NP Martin-Velez reviewed high magnesium foods with patient.  Sent letter with educational magnesium containing food list.

## 2017-11-29 MED FILL — NEORAL/25MG/CAP: NEORAL/25MG/CAP | 30 days supply | Qty: 120 | Fill #10

## 2017-12-01 NOTE — Unmapped (Signed)
Patient does not need a refill of specialty medication at this time. Moving specialty refill reminder call to appropriate date and removed call attempt data.  Patient already spoke with someone and scheduled the medication and also have received the medication.

## 2017-12-05 NOTE — Unmapped (Signed)
Called patient to discuss scheduling liver transplant annual appointments, unable to reach patient left VM requesting a call back at [phone].

## 2017-12-08 LAB — COMPREHENSIVE METABOLIC PANEL
BLOOD UREA NITROGEN: 29 mg/dL
CALCIUM: 9.3 mg/dL
CHLORIDE: 102 mmol/L
CO2: 21 mmol/L
CREATININE: 1.66 mg/dL
EGFR MDRD AF AMER: 38 mL/min/{1.73_m2}
EGFR MDRD NON AF AMER: 33 mL/min/{1.73_m2}
GLUCOSE RANDOM: 132 mg/dL
SODIUM: 135 mmol/L

## 2017-12-08 LAB — ALBUMIN: Lab: 1.3

## 2017-12-08 LAB — SODIUM: Lab: 135

## 2017-12-08 LAB — VITAMIN D 1,25-DIHYDROXY: Lab: 12.2

## 2017-12-08 LAB — PHOSPHORUS: Lab: 3.1

## 2017-12-08 LAB — PARATHYROID HORMONE INTACT: Lab: 293

## 2017-12-12 NOTE — Unmapped (Signed)
Patient labs checked by local nephrologist. Below was the fax sent to our office about current plan of care.

## 2017-12-14 NOTE — Unmapped (Deleted)
{** REMINDER - THIS NOTE IS NOT A FINAL MEDICAL RECORD UNTIL IT IS SIGNED.  UNTIL THEN, THE CONTENT BELOW MAY REFLECT INFORMATION FROM A DOCUMENTATION TEMPLATE, NOT THE ACTUAL PATIENT VISIT. **}          PCP:  Dolan Amen, MD    12/14/2017    Chief Complaint: Follow-up visit CKD    Background: It has been thought that part of her chronic kidney disease could be related to calcineurin inhibitor use as her creatinine has been elevated intermittently in the past.     Cr trends: 1.1 (05/10/99) > 1.5 (07/21/08) > 1.24 (03/24/09) > 1.01 (04/05/09) > 0.69 (04/07/09) > 1.32 (12/14/10)  > 1.35 (07/02/12) > 1.24 (04/09/13) > 1.16 (07/18/13)    HPI:  [name] is a 62 y.o. 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    Patient was last seen by Dr. Paulita Cradle on 12/04/14. She presents today ***    ROS:   All systems reviewed and are negative except as listed above.    PAST MEDICAL HISTORY:  Past Medical History:   Diagnosis Date   ??? Chronic kidney disease    ??? Cirrhosis (CMS-HCC)    ??? HTN (hypertension) (RAF-HCC)        ALLERGIES  Iodinated contrast- oral and iv dye; Ioxaglate sodium; and Latex    MEDICATIONS:  Current Outpatient Prescriptions   Medication Sig Dispense Refill   ??? albuterol (PROVENTIL HFA;VENTOLIN HFA) 90 mcg/actuation inhaler Inhale 1-2 puffs every six (6) hours as needed for wheezing. 1 Inhaler 0   ??? alendronate (FOSAMAX) 70 MG tablet Take 70 mg by mouth once a week. Frequency:1XW   Dosage:70   MG  Instructions:  Note:Dose: 70MG      ??? alendronate (FOSAMAX) 70 MG tablet Take 70 mg by mouth.     ??? allopurinol (ZYLOPRIM) 100 MG tablet Take 100 mg by mouth.     ??? amLODIPine (NORVASC) 5 MG tablet Take 5 mg by mouth daily.  2   ??? calcitonin, salmon, (MIACALCIN) 200 unit/actuation nasal spray 1 spray by Alternating Nares route once daily.      ??? calcium carbonate-vitamin D3 500-100 mg-unit Chew Frequency: Dosage:0     Instructions:  Note:2 daily     ??? cholecalciferol, vitamin D3, (VITAMIN D3) 1,000 unit capsule Take 1,000 Units by mouth daily. Frequency:   Dosage:0   UNIT  Instructions:  Note:1 po daily     ??? citalopram (CELEXA) 10 MG tablet Take 10 mg by mouth once daily. Frequency:QD   Dosage:20   MG  Instructions:  Note:Dose: 20MG      ??? darbepoetin alfa-polysorbate (ARANESP, IN POLYSORBATE,) 200 mcg/0.4 mL Syrg Note:Once a month Dose: 200MCG/0.4     ??? ergocalciferol (VITAMIN D2) 50,000 unit capsule Take 50,000 Units by mouth once a week.      ??? fluticasone (FLONASE) 50 mcg/actuation nasal spray daily as needed.      ??? gabapentin (NEURONTIN) 300 MG capsule Take 300 mg by mouth daily as needed.   1   ??? hydrochlorothiazide (HYDRODIURIL) 12.5 MG tablet Take 12.5 mg by mouth daily. Frequency:QD   Dosage:12.5   MG  Instructions:  Note:Dose: 12.5 MG     ??? metoprolol succinate (TOPROL-XL) 50 MG 24 hr tablet Take 50 mg by mouth daily.      ??? multivitamin with minerals tablet Take 1 tablet by mouth daily.      ???  NEORAL 25 mg capsule Take 2 capsules (50 mg total) by mouth Two (2) times a day. 360 capsule 3   ??? NEORAL 25 mg capsule Take 2 capsules (50 mg total) by mouth Two (2) times a day. PATIENT ACTUALLY TAKING 75.mg AM and 50.mg PM 176 capsule 0   ??? NEORAL 25 mg capsule Take 2 capsules (50 mg total) by mouth Two (2) times a day. for 6 days PATIENT ACTUALLY TAKING 75.mg in AM and 50.mg in PM 24 capsule 0   ??? NEORAL 25 mg capsule Take 2 capsules (50 mg total) by mouth Two (2) times a day. PATIENT ACTUALLY TAKING 75.mg in the AM and 50.mg in the PM 360 capsule 0   ??? ondansetron (ZOFRAN-ODT) 4 MG disintegrating tablet Take 4 mg by mouth.     ??? oxyCODONE (ROXICODONE) 15 MG immediate release tablet Take 1 tablet (15 mg total) by mouth 4 (four) times a day as needed. (Patient taking differently: Take 10 mg by mouth 4 (four) times a day as needed. ) 120 tablet 0   ??? promethazine (PHENERGAN) 12.5 MG tablet Take 12.5 mg by mouth every six (6) hours as needed.   6   ??? TOPROL XL 50 mg 24 hr tablet Take 50 mg by mouth daily.  5   ??? VOLTAREN 1 % gel Note:Apply 4 grams to painful knees, up to 4 times daily, as needed 300 g 3   ??? zolpidem (AMBIEN) 5 MG tablet Take 5 mg by mouth nightly as needed.        No current facility-administered medications for this visit.        PHYSICAL EXAM:  There were no vitals filed for this visit.  There were no vitals filed for this visit.  There is no height or weight on file to calculate BMI.    CONSTITUTIONAL: Alert,well appearing, no distress  HEENT: Moist mucous membranes, oropharynx clear without erythema or exudate  EYES: Pupils reactive, sclerae anicteric.  NECK: Supple, no lymphadenopathy  CARDIOVASCULAR: Regular, normal S1/S2 heart sounds, no murmurs, no rubs.   PULM: Clear to auscultation bilaterally  GASTROINTESTINAL: Soft, active bowel sounds, nontender  EXTREMITIES: No lower extremity edema bilaterally.   SKIN: No rashes or lesions  NEUROLOGIC: No focal motor or sensory deficits    MEDICAL DECISION MAKING  Abstract on 12/08/2017   Component Date Value Ref Range Status   ??? Sodium 12/05/2017 135  mmol/L Final   ??? Potassium 12/05/2017 4.4  mmol/L Final   ??? Chloride 12/05/2017 102  mmol/L Final   ??? CO2 12/05/2017 21.0  mmol/L Final   ??? BUN 12/05/2017 29  mg/dL Final   ??? Creatinine 12/05/2017 1.66  mg/dL Final   ??? Glucose 16/07/9603 132  mg/dL Final   ??? Calcium 54/06/8118 9.3  mg/dL Final   ??? EGFR MDRD Non Af Amer 12/05/2017 33  mL/min/1.59m2 Final   ??? EGFR MDRD Af Amer 12/05/2017 38  mL/min/1.7m2 Final   ??? Albumin 12/05/2017 1.3  g/dL Final   ??? Phosphorus 12/05/2017 3.1  mg/dL Final   ??? PTH 14/78/2956 293.0  pg/mL Final   ??? Vit D, 1,25-Dihydroxy 12/05/2017 12.2   Final       Creatinine   Date Value Ref Range Status   12/05/2017 1.66 mg/dL Final   21/30/8657 8.46 (H) 0.57 - 1.00 mg/dL Final   96/29/5284 1.32 (H) 0.57 - 1.00 mg/dL Final   44/10/270 5.36 (H) 0.57 - 1.00 mg/dL Final   64/40/3474 2.59 (H) 0.57 -  1.00 mg/dL Final   54/06/8118 1.47 (H) 0.57 - 1.00 mg/dL Final   82/95/6213 0.86 (H) 0.60 - 1.00 mg/dL Final   57/84/6962 9.52 (H) 0.57 - 1.00 mg/dL Final   84/13/2440 1.02 (H) 0.57 - 1.00 mg/dL Final   72/53/6644 0.34 (H) 0.57 - 1.00 mg/dL Final   74/25/9563 8.75 (H) 0.57 - 1.00 mg/dL Final   64/33/2951 8.84 (H) 0.57 - 1.00 mg/dL Final       Protein/Creatinine Ratio, Urine   Date Value Ref Range Status   12/04/2014 0.190 UNDEFINED Final   02/27/2014 0.201 UNDEFINED Final   07/18/2013 0.597 UNDEFINED Final   05/30/2013 0.264 UNDEFINED Final   01/17/2013 0.099 UNDEFINED Final     {*** HELP TEXT ***    This SmartLink requires parameters for processing. Parameters allow for more information to be given to the SmartLink. This allows you to request specific information by giving the SmartLink precise direction.    The SmartLink accepts a list of result component base names separated by commas. You can also request the number of results to display for each component. To indicate the number of results for each component, type the component name followed by a colon and then the number of results (Name:4). For all results for that particular component, type a star in place of the number (Name:*). To obtain the first and last results for a particular component, type FL in place of the number or the star (Name:FL). To obtain the last result for a particular component, type the component name without a colon, number, or star.    Example: .LASTLAB[MCH:3,MCV:*,HCT  }(malbcrerat:10)@  No components found for: ALBCREAT      ASSESSMENT/PLAN:    Ms.Cheyanna Harneet Noblett is a 62 y.o. patient with a past medical history significant for CKD in setting of cryptogenic cirrhosis s/p OLT in 1996 who is being seen for follow-up visit.    1. CKD - This is likely related to long-term calcineurin inhibition and possible volume depletion insults over time and her creatinine appears to be around 1.1-1.3 over the past decade with intermittent fluctuations and has recently been 1.5-1.6***, which we will monitor for now. A serologic work-up has also been unremarkable outside of a positive ANA in the setting of normocomplementemia and a family history significant for SLE, including her sister and mother. She has very mild proteinuria as well in the past, but this is ***not present today. Counseled to avoid nephrotoxins including NSAIDs, aminoglycosides and IV contrast. Will continue to monitor.    2. HTN -  well-controlled. Will continue amlodipine. We did initially consider starting ACE inhibitor, however has intermittent hyperkalemia (K 5.4), hence will hold off.    3. Anemia - fluctuant but at or near baseline. ESA as needed locally and will continue aranesp if Hb <10.    4. Mineral metabolism - high PTH, with normal Ca and phosphorus. Awaiting vit-D, will hold off an intervention for now.    5. Preventive medicine -     Ms.Emie Sommerfeld will follow up {VDF/U:27988} or sooner if indicated.

## 2017-12-14 NOTE — Unmapped (Deleted)
{** REMINDER - THIS NOTE IS NOT A FINAL MEDICAL RECORD UNTIL IT IS SIGNED.  UNTIL THEN, THE CONTENT BELOW MAY REFLECT INFORMATION FROM A DOCUMENTATION TEMPLATE, NOT THE ACTUAL PATIENT VISIT. **}      PCP:  Dolan Amen, MD  [address]  [place], [place] [address]  Phone [phone]  Fax [phone]    12/14/2017    Chief Complaint: Follow up vist CKD IIIa    Background: Katherine Norton is a 62 y.o. woman with cryptogenic cirrhosis s/p 1996 OLT who was initially seen in   consultation at the request of Felipa Evener, NP for evaluation of elevated creatinine and is here for follow-up.    It has been thought that part of her chronic kidney disease could be related to calcineurin inhibitor use as her creatinine has been elevated intermittently in the past.     Cr trends: 071700 1.1 -> 092809 1.5 -> 060110 1.24 -> 061310 1.01 -> 061510 0.69 -> 022112 1.32   -> 130865 1.35 -> 784696 1.24 -> (295284 spot UPC ratio 0.264) -> 132440 1.16    HPI:  Katherine Norton presents for a follow-up appointment.      Patient was last seen by Dr. Paulita Cradle on 12/04/14. She presents today ***    ROS:   CONSTITUTIONAL: denies fevers or chills, denies unintentional weight loss  CARDIOVASCULAR: denies chest pain, denies dyspnea on exertion, denies leg edema  GASTROINTESTINAL: denies nausea, denies vomiting, denies anorexia  GENITOURINARY: denies dysuria, denies hematuria, denies decreased urinary stream  All systems reviewed and are negative except as listed above.    PAST MEDICAL HISTORY:  Cirrhosis: cryptogenic according to most recent note by Dr. 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   - s/p 2010 hernia repair   Hypertension   s/p total abdominal hysterectomy and ovariectomy in 1981 (indication: pain)   s/p tonsillectomy at age 5    ALLERGIES  Iodinated contrast- oral and iv dye; Ioxaglate sodium; and Latex     SOCIAL HISTORY: lives alone in [facility], has son who is depressed and a daughter overseas who   is stationed in [place] and they talk daily on the phone, on disability, originally from   [place], [place]   - tobacco: denies   - EtOH: denies   - illicits: denies   - transfusions: in past peri-transplant   - tattoos: denies     FAMILY HISTORY:  Dad with prostate cancer    MEDICATIONS:  Current Outpatient Prescriptions   Medication Sig Dispense Refill   ??? albuterol (PROVENTIL HFA;VENTOLIN HFA) 90 mcg/actuation inhaler Inhale 1-2 puffs every six (6) hours as needed for wheezing. 1 Inhaler 0   ??? alendronate (FOSAMAX) 70 MG tablet Take 70 mg by mouth once a week. Frequency:1XW   Dosage:70   MG  Instructions:  Note:Dose: 70MG      ??? alendronate (FOSAMAX) 70 MG tablet Take 70 mg by mouth.     ??? allopurinol (ZYLOPRIM) 100 MG tablet Take 100 mg by mouth.     ??? amLODIPine (NORVASC) 5 MG tablet Take 5 mg by mouth daily.  2   ??? calcitonin, salmon, (MIACALCIN) 200 unit/actuation nasal spray 1 spray by Alternating Nares route once daily.      ??? calcium carbonate-vitamin D3 500-100 mg-unit Chew Frequency:   Dosage:0     Instructions:  Note:2 daily     ???  cholecalciferol, vitamin D3, (VITAMIN D3) 1,000 unit capsule Take 1,000 Units by mouth daily. Frequency:   Dosage:0   UNIT  Instructions:  Note:1 po daily     ??? citalopram (CELEXA) 10 MG tablet Take 10 mg by mouth once daily. Frequency:QD   Dosage:20   MG  Instructions:  Note:Dose: 20MG      ??? darbepoetin alfa-polysorbate (ARANESP, IN POLYSORBATE,) 200 mcg/0.4 mL Syrg Note:Once a month Dose: 200MCG/0.4     ??? ergocalciferol (VITAMIN D2) 50,000 unit capsule Take 50,000 Units by mouth once a week.      ??? fluticasone (FLONASE) 50 mcg/actuation nasal spray daily as needed.      ??? gabapentin (NEURONTIN) 300 MG capsule Take 300 mg by mouth daily as needed.   1   ??? hydrochlorothiazide (HYDRODIURIL) 12.5 MG tablet Take 12.5 mg by mouth daily. Frequency:QD   Dosage:12.5   MG  Instructions: Note:Dose: 12.5 MG     ??? metoprolol succinate (TOPROL-XL) 50 MG 24 hr tablet Take 50 mg by mouth daily.      ??? multivitamin with minerals tablet Take 1 tablet by mouth daily.      ??? NEORAL 25 mg capsule Take 2 capsules (50 mg total) by mouth Two (2) times a day. 360 capsule 3   ??? NEORAL 25 mg capsule Take 2 capsules (50 mg total) by mouth Two (2) times a day. PATIENT ACTUALLY TAKING 75.mg AM and 50.mg PM 176 capsule 0   ??? NEORAL 25 mg capsule Take 2 capsules (50 mg total) by mouth Two (2) times a day. for 6 days PATIENT ACTUALLY TAKING 75.mg in AM and 50.mg in PM 24 capsule 0   ??? NEORAL 25 mg capsule Take 2 capsules (50 mg total) by mouth Two (2) times a day. PATIENT ACTUALLY TAKING 75.mg in the AM and 50.mg in the PM 360 capsule 0   ??? ondansetron (ZOFRAN-ODT) 4 MG disintegrating tablet Take 4 mg by mouth.     ??? oxyCODONE (ROXICODONE) 15 MG immediate release tablet Take 1 tablet (15 mg total) by mouth 4 (four) times a day as needed. (Patient taking differently: Take 10 mg by mouth 4 (four) times a day as needed. ) 120 tablet 0   ??? promethazine (PHENERGAN) 12.5 MG tablet Take 12.5 mg by mouth every six (6) hours as needed.   6   ??? TOPROL XL 50 mg 24 hr tablet Take 50 mg by mouth daily.  5   ??? VOLTAREN 1 % gel Note:Apply 4 grams to painful knees, up to 4 times daily, as needed 300 g 3   ??? zolpidem (AMBIEN) 5 MG tablet Take 5 mg by mouth nightly as needed.        No current facility-administered medications for this visit.        PHYSICAL EXAM:  There were no vitals filed for this visit.  Wt Readings from Last 3 Encounters:   12/26/16 72.7 kg (160 lb 4.8 oz)   04/01/16 71.2 kg (157 lb)   03/24/16 70.8 kg (156 lb)     There is no height or weight on file to calculate BMI.    CONSTITUTIONAL: Alert,well appearing, no distress  HEENT: Moist mucous membranes, oropharynx clear without erythema or exudate  EYES: Extra ocular movements intact. Pupils reactive, sclerae anicteric.  NECK: Supple, no lymphadenopathy CARDIOVASCULAR: Regular, normal S1/S2 heart sounds, no murmurs, no rubs.   PULM: Clear to auscultation bilaterally  GASTROINTESTINAL: Soft, active bowel sounds, nontender  EXTREMITIES: No lower extremity edema  bilaterally.   SKIN: No rashes or lesions  NEUROLOGIC: No focal motor or sensory deficits    MEDICAL DECISION MAKING    021116 LABS: WBC 2.5, Hb 9.1, Cr 1.58, albumin 3.9, RF undetectable, C3 113, C4 37, urinalysis negative, spot UPC 0.190    ASSESSMENT/PLAN:    Katherine Norton is a 62 y.o. year old patient with a past medical history significant for cryptogenic cirrhosis s/p 1996 OLT, CKD IIIa who is being seen for a follow-up visit.     1. CKD III (A3) -This is likely related to long-term calcineurin inhibition and possible volume depletion insults over time and her creatinine appears to be around 1.1-1.3 over the past decade with intermittent fluctuations and has recently been 1.5-1.6, which we will monitor for now. A serologic work-up has also been unremarkable outside of a positive ANA in the setting of normocomplementemia and a family history significant for SLE, including her sister and mother. She has very mild proteinuria as well in the past, but this is not present today.   - continue to monitor  - counseled to avoid nephrotoxins including NSAIDs, aminoglycosides and IV contrast    2. Hypertension: well-controlled   - continue amlodipine   - we did initially consider starting ACE inhibitor, however has intermittent hyperkalemia (K 5.4), hence will hold off    3. Anemia: fluctuant but at or near baseline  - ESA as needed locally and will continue aranesp if Hb < 10     4. CKD - BMD: high PTH, with normal Ca and phosphorus. Awaiting vit-D, will hold off an intervention for now.    5. Preventive medicine  - Vaccines: FLU (refused due to prior reactions), PVX (TBD), DTaP (TBD), HBV (TBD)   - Mammogram: 2013 negative   - Pap Smear: TAH/SPOO   - Colonoscopy: due 2018     FOLLOW-UP:  Katherine Norton will follow up in 6 months  - have attempted to transition her to fellows clinic and will continue attempts in future given that she is stable without glomerular congestion

## 2017-12-20 NOTE — Unmapped (Signed)
[facility] Refill Coordination Note  Specialty Medication(s): Neoral 25mg     Clarine Elrod, DOB: 06-Feb-1956  Phone: [phone] (home) , Alternate phone contact: N/A  Phone or address changes today?: No  All above HIPAA information was verified with patient.  Shipping Address: [address]  [place] [address]   Insurance changes? No    Completed refill call assessment today to schedule patient's medication shipment from the [facility] [phone]).      Confirmed the medication and dosage are correct and have not changed: Yes, regimen is correct and unchanged.    Confirmed patient started or stopped the following medications in the past month:  No, there are no changes reported at this time.    Are you tolerating your medication?:  Hazell reports tolerating the medication.    ADHERENCE    (Below is required for Medicare Part B or Transplant patients only - per drug):   How many tablets were dispensed last month:     Neoral 25 mg   Quantity filled last month: 120   # of tablets left on hand: 7 DAYS    Did you miss any doses in the past 4 weeks? No missed doses reported.    FINANCIAL/SHIPPING    Delivery Scheduled: Yes, Expected medication delivery date: 12/27/2017     The patient will receive an [facility] print out for each medication shipped and additional FDA Medication Guides as required.  Patient education from [facility] or Robet Leu may also be included in the shipment    Dorothy did not have any additional questions at this time.    Delivery address validated in FSI scheduling system: Yes, address listed in FSI is correct.    We will follow up with patient monthly for standard refill processing and delivery.      Thank you,  Tamala Fothergill   [facility] Specialty Technician

## 2017-12-25 MED FILL — NEORAL/25MG/CAP: NEORAL/25MG/CAP | 30 days supply | Qty: 120 | Fill #11

## 2018-01-09 NOTE — Unmapped (Signed)
Left VM for patient making her aware that I am out of the office.

## 2018-01-13 LAB — COMPREHENSIVE METABOLIC PANEL
A/G RATIO: 1.1 — ABNORMAL LOW (ref 1.2–2.2)
ALKALINE PHOSPHATASE: 62 IU/L (ref 39–117)
ALT (SGPT): 10 IU/L (ref 0–32)
AST (SGOT): 15 IU/L (ref 0–40)
BILIRUBIN TOTAL: 0.4 mg/dL (ref 0.0–1.2)
BLOOD UREA NITROGEN: 30 mg/dL — ABNORMAL HIGH (ref 8–27)
BUN / CREAT RATIO: 15 (ref 12–28)
CALCIUM: 8.5 mg/dL — ABNORMAL LOW (ref 8.7–10.3)
CHLORIDE: 108 mmol/L — ABNORMAL HIGH (ref 96–106)
CO2: 17 mmol/L — ABNORMAL LOW (ref 20–29)
CREATININE: 1.96 mg/dL — ABNORMAL HIGH (ref 0.57–1.00)
GFR MDRD AF AMER: 31 mL/min/{1.73_m2} — ABNORMAL LOW
GFR MDRD NON AF AMER: 27 mL/min/{1.73_m2} — ABNORMAL LOW
GLOBULIN, TOTAL: 3.6 g/dL (ref 1.5–4.5)
GLUCOSE: 129 mg/dL — ABNORMAL HIGH (ref 65–99)
SODIUM: 142 mmol/L (ref 134–144)
TOTAL PROTEIN: 7.5 g/dL (ref 6.0–8.5)

## 2018-01-13 LAB — CBC W/ DIFFERENTIAL
BANDED NEUTROPHILS ABSOLUTE COUNT: 0.1 10*3/uL (ref 0.0–0.1)
BASOPHILS ABSOLUTE COUNT: 0 10*3/uL (ref 0.0–0.2)
BASOPHILS RELATIVE PERCENT: 0 %
EOSINOPHILS ABSOLUTE COUNT: 0 10*3/uL (ref 0.0–0.4)
EOSINOPHILS RELATIVE PERCENT: 0 %
HEMATOCRIT: 31 % — ABNORMAL LOW (ref 34.0–46.6)
HEMOGLOBIN: 10.3 g/dL — ABNORMAL LOW (ref 11.1–15.9)
LYMPHOCYTES ABSOLUTE COUNT: 0.8 10*3/uL (ref 0.7–3.1)
LYMPHOCYTES RELATIVE PERCENT: 11 %
MEAN CORPUSCULAR HEMOGLOBIN CONC: 33.2 g/dL (ref 31.5–35.7)
MEAN CORPUSCULAR VOLUME: 90 fL (ref 79–97)
MONOCYTES ABSOLUTE COUNT: 0.4 10*3/uL (ref 0.1–0.9)
MONOCYTES RELATIVE PERCENT: 5 %
NEUTROPHILS ABSOLUTE COUNT: 6.1 10*3/uL (ref 1.4–7.0)
NEUTROPHILS RELATIVE PERCENT: 83 %
PLATELET COUNT: 134 10*3/uL — ABNORMAL LOW (ref 150–379)
RED BLOOD CELL COUNT: 3.43 x10E6/uL — ABNORMAL LOW (ref 3.77–5.28)
WHITE BLOOD CELL COUNT: 7.4 10*3/uL (ref 3.4–10.8)

## 2018-01-13 LAB — BILIRUBIN DIRECT: Lab: 0.15

## 2018-01-13 LAB — GLOBULIN, TOTAL: Lab: 3.6

## 2018-01-13 LAB — MAGNESIUM: Lab: 1.2 — ABNORMAL LOW

## 2018-01-13 LAB — MEAN CORPUSCULAR HEMOGLOBIN CONC: Lab: 33.2

## 2018-01-13 LAB — GAMMA GLUTAMYL TRANSFERASE: Lab: 15

## 2018-01-13 LAB — PHOSPHORUS, SERUM: Lab: 3.3

## 2018-01-14 LAB — CYCLOSPORINE, LC-MS/MS: Lab: 38 — ABNORMAL LOW

## 2018-01-15 ENCOUNTER — Encounter: Admit: 2018-01-15 | Discharge: 2018-01-15 | Payer: MEDICARE

## 2018-01-15 ENCOUNTER — Encounter: Admit: 2018-01-15 | Discharge: 2018-01-16 | Payer: MEDICARE

## 2018-01-15 DIAGNOSIS — Z944 Liver transplant status: Principal | ICD-10-CM

## 2018-01-15 DIAGNOSIS — K769 Liver disease, unspecified: Secondary | ICD-10-CM

## 2018-01-15 DIAGNOSIS — Z79899 Other long term (current) drug therapy: Secondary | ICD-10-CM

## 2018-01-15 DIAGNOSIS — Z23 Encounter for immunization: Secondary | ICD-10-CM

## 2018-01-15 LAB — COMPREHENSIVE METABOLIC PANEL
ALBUMIN: 3.8 g/dL (ref 3.5–5.0)
ALKALINE PHOSPHATASE: 60 U/L (ref 38–126)
ALT (SGPT): 16 U/L (ref 15–48)
ANION GAP: 10 mmol/L (ref 9–15)
AST (SGOT): 18 U/L (ref 14–38)
BILIRUBIN TOTAL: 0.5 mg/dL (ref 0.0–1.2)
BLOOD UREA NITROGEN: 28 mg/dL — ABNORMAL HIGH (ref 7–21)
BUN / CREAT RATIO: 14
CALCIUM: 8.8 mg/dL (ref 8.5–10.2)
CHLORIDE: 111 mmol/L — ABNORMAL HIGH (ref 98–107)
CREATININE: 2.02 mg/dL — ABNORMAL HIGH (ref 0.60–1.00)
EGFR MDRD AF AMER: 30 mL/min/{1.73_m2} — ABNORMAL LOW (ref >=60)
EGFR MDRD NON AF AMER: 25 mL/min/{1.73_m2} — ABNORMAL LOW (ref >=60)
GLUCOSE RANDOM: 90 mg/dL (ref 65–179)
POTASSIUM: 4.8 mmol/L (ref 3.5–5.0)
PROTEIN TOTAL: 7.2 g/dL (ref 6.5–8.3)
SODIUM: 142 mmol/L (ref 135–145)

## 2018-01-15 LAB — MEAN CORPUSCULAR VOLUME: Lab: 93

## 2018-01-15 LAB — CBC W/ AUTO DIFF
BASOPHILS ABSOLUTE COUNT: 0 10*9/L (ref 0.0–0.1)
BASOPHILS RELATIVE PERCENT: 0.1 %
EOSINOPHILS RELATIVE PERCENT: 2.8 %
HEMATOCRIT: 30.4 % — ABNORMAL LOW (ref 36.0–46.0)
HEMOGLOBIN: 9.7 g/dL — ABNORMAL LOW (ref 12.0–16.0)
LARGE UNSTAINED CELLS: 2 % (ref 0–4)
LYMPHOCYTES ABSOLUTE COUNT: 0.6 10*9/L — ABNORMAL LOW (ref 1.5–5.0)
LYMPHOCYTES RELATIVE PERCENT: 12.9 %
MEAN CORPUSCULAR HEMOGLOBIN CONC: 32 g/dL (ref 31.0–37.0)
MEAN CORPUSCULAR VOLUME: 93 fL (ref 80.0–100.0)
MEAN PLATELET VOLUME: 8.7 fL (ref 7.0–10.0)
MONOCYTES RELATIVE PERCENT: 6.4 %
NEUTROPHILS ABSOLUTE COUNT: 3.6 10*9/L (ref 2.0–7.5)
NEUTROPHILS RELATIVE PERCENT: 76.2 %
PLATELET COUNT: 108 10*9/L — ABNORMAL LOW (ref 150–440)
RED BLOOD CELL COUNT: 3.27 10*12/L — ABNORMAL LOW (ref 4.00–5.20)
RED CELL DISTRIBUTION WIDTH: 15.7 % — ABNORMAL HIGH (ref 12.0–15.0)

## 2018-01-15 LAB — CO2: Carbon dioxide:SCnc:Pt:Ser/Plas:Qn:: 21 — ABNORMAL LOW

## 2018-01-15 LAB — PHOSPHORUS: Phosphate:MCnc:Pt:Ser/Plas:Qn:: 4.3

## 2018-01-15 LAB — CYCLOSPORINE, TROUGH: Lab: 63

## 2018-01-15 LAB — MAGNESIUM
MAGNESIUM: 0.8 mg/dL — CL (ref 1.6–2.2)
Magnesium:MCnc:Pt:Ser/Plas:Qn:: 0.8 — CL

## 2018-01-15 LAB — BILIRUBIN DIRECT: Bilirubin.glucuronidated:MCnc:Pt:Ser/Plas:Qn:: 0.3

## 2018-01-15 LAB — GAMMA GLUTAMYL TRANSFERASE: Gamma glutamyl transferase:CCnc:Pt:Ser/Plas:Qn:: 19

## 2018-01-15 MED ORDER — MAGNESIUM OXIDE-MAGNESIUM AMINO ACID CHELATE 133 MG TABLET: 1 | tablet | Freq: Two times a day (BID) | 3 refills | 0 days | Status: AC

## 2018-01-15 MED ORDER — NEORAL 25 MG CAPSULE: 50 mg | capsule | Freq: Two times a day (BID) | 3 refills | 0 days | Status: AC

## 2018-01-15 MED ORDER — MG-PLUS-PROTEIN 133 MG TABLET
ORAL | 3 refills | 0 days | Status: SS
Start: 2018-01-15 — End: 2018-06-02

## 2018-01-15 MED ORDER — NEORAL 25 MG CAPSULE
ORAL_CAPSULE | Freq: Two times a day (BID) | ORAL | 3 refills | 0.00000 days | Status: CP
Start: 2018-01-15 — End: 2018-01-15

## 2018-01-15 MED ORDER — MAGNESIUM OXIDE-MAGNESIUM AMINO ACID CHELATE 133 MG TABLET
ORAL_TABLET | Freq: Two times a day (BID) | ORAL | 3 refills | 0.00000 days | Status: CP
Start: 2018-01-15 — End: 2018-01-15

## 2018-01-15 NOTE — Unmapped (Signed)
FOLLOW UP ANNUAL LIVER CLINIC NOTE     Patient Name: Katherine Norton  Medical Record Number: [id number]  Date of Service: 01/15/2018    Referring Physician: Domingo Sep   Current complaint: Follow up Annual Liver    Assessment/Plan:     Malarie Tappen is a 62 y.o. 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 Recent LFT testing/images WNL; significant uptrend in Creatine.  Immunosuppressive monotherapy goal (50-100). Continue neoral 50mg  BID.    Concern for low Magnesium 0.8 today. Given her kidney function, she was instructed to take her home magnesium here in the office (1 tablet=400 mg). Prescription for magnesium chelate sent to shared [facility], in hopes of improving diarrheal symptoms. Educated her on magnesium rich foods.    Will send notes to PCP and nephrologist to encourage close follow up of kidney function. She is on minimal dosing of CNI and her dose was reduced over the last year so I do not feel this contributed to her acute injury.       HEALTH MAINTENANCE:   -Given Shingrix vaccine today. Will follow up with PCP for second dosebetween 5/25-9-25.   -Current on influenza and pneumococcal vaccinations.     Return to clinic: 1 year  Labs: Monthly     I spent a total of 25 minutes of which 50% was spent in counseling and coordination of care.    Subjective:     HPI: Katherine Norton is a 62 y.o. 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 She returns today for her annual follow up with no acute complaints at this time, she doesn't count her years post because she doesn't want to jinx it. She states she has been doing well, and has good energy.      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     At prior evaluation she reportedly had issues with recurrent bronchitis and her neoral dose was reduced to 50mg  BID, she has had one recurrence of bronchitis  this year, no other atypical infection. She was also admitted in December 2018 for AKI at [facility], she reports her nephrologist believed was 2/2 her IS. She reports she was also having some diarrhea, so her nephrologist d/c her magnesium supplement (400mg  daily).    Denies fever, chills, arthralgias, weight loss/gain, and fatigue. Denies chest pain, SOB, N/V/D, constipation or abdominal pain. No acute complaint today.    Past Medical History:   Diagnosis Date   ??? Chronic kidney disease    ??? Cirrhosis (CMS-HCC)    ??? HTN (hypertension)        Past Surgical History:   Procedure Laterality Date   ??? HYSTERECTOMY     ??? 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 TONSILLECTOMY         Family History   Problem Relation Age of Onset   ??? Hypertension Mother    ??? Hypertension Father    ??? Prostate cancer Father    ??? Kidney disease Neg Hx    ??? Lupus Sister    ??? Lupus Brother        Social History     Social History   ??? Marital status: Single     Spouse name: N/A   ??? Number of  children: N/A   ??? Years of education: N/A     Occupational History   ??? Not on file.     Social History Main Topics   ??? Smoking status: Never Smoker   ??? Smokeless tobacco: Never Used   ??? Alcohol use No   ??? Drug use: No   ??? Sexual activity: Not on file     Other Topics Concern   ??? Not on file     Social History Narrative   ??? No narrative on file       REVIEW OF SYSTEMS:   The balance of 10/12 systems is negative with the exception of HPI.    Objective:   BP 155/90  - Pulse 108  - Temp 37.1 ??C (98.8 ??F) (Tympanic)  - Ht 152.4 cm (5')  - Wt 73.2 kg (161 lb 6.4 oz)  - BMI 31.52 kg/m??     MEDICATIONS:  Allergies   Allergen Reactions   ??? Iodinated Contrast- Oral And Iv Dye    ??? Ioxaglate Sodium    ??? Latex        Current Outpatient Prescriptions   Medication Sig Dispense Refill   ??? albuterol (PROVENTIL HFA;VENTOLIN HFA) 90 mcg/actuation inhaler Inhale 1-2 puffs every six (6) hours as needed for wheezing. 1 Inhaler 0   ??? alendronate (FOSAMAX) 70 MG tablet Take 70 mg by mouth once a week. Frequency:1XW   Dosage:70   MG  Instructions:  Note:Dose: 70MG      ??? allopurinol (ZYLOPRIM) 100 MG tablet Take 100 mg by mouth.     ??? amLODIPine (NORVASC) 5 MG tablet Take 5 mg by mouth daily.  2   ??? calcitonin, salmon, (MIACALCIN) 200 unit/actuation nasal spray 1 spray by Alternating Nares route once daily.      ??? calcium carbonate-vitamin D3 500-100 mg-unit Chew Frequency:   Dosage:0     Instructions:  Note:2 daily     ??? cholecalciferol, vitamin D3, (VITAMIN D3) 1,000 unit capsule Take 1,000 Units by mouth daily. Frequency:   Dosage:0   UNIT  Instructions:  Note:1 po daily     ??? citalopram (CELEXA) 10 MG tablet Take 10 mg by mouth once daily. Frequency:QD   Dosage:20   MG  Instructions:  Note:Dose: 20MG      ??? darbepoetin alfa-polysorbate (ARANESP, IN POLYSORBATE,) 200 mcg/0.4 mL Syrg Note:Once a month Dose: 200MCG/0.4     ??? ergocalciferol (VITAMIN D2) 50,000 unit capsule Take 50,000 Units by mouth once a week.      ??? fluticasone (FLONASE) 50 mcg/actuation nasal spray daily as needed.      ??? gabapentin (NEURONTIN) 300 MG capsule Take 300 mg by mouth daily as needed.   1   ??? hydrochlorothiazide (HYDRODIURIL) 12.5 MG tablet Take 12.5 mg by mouth daily. Frequency:QD   Dosage:12.5   MG  Instructions:  Note:Dose: 12.5 MG     ??? metoprolol succinate (TOPROL-XL) 50 MG 24 hr tablet Take 50 mg by mouth daily.      ??? multivitamin with minerals tablet Take 1 tablet by mouth daily.      ??? NEORAL 25 mg capsule Take 2 capsules (50 mg total) by mouth Two (2) times a day. 360 capsule 3   ??? ondansetron (ZOFRAN-ODT) 4 MG disintegrating tablet Take 4 mg by mouth.     ??? oxyCODONE (ROXICODONE) 15 MG immediate release tablet Take 1 tablet (15 mg total) by mouth 4 (four) times a day as needed. (Patient taking differently: Take 10 mg by mouth  4 (four) times a day as needed. ) 120 tablet 0   ??? promethazine (PHENERGAN) 12.5 MG tablet Take 12.5 mg by mouth every six (6) hours as needed.   6   ??? VOLTAREN 1 % gel Note:Apply 4 grams to painful knees, up to 4 times daily, as needed 300 g 3   ??? zolpidem (AMBIEN) 5 MG tablet Take 5 mg by mouth nightly as needed.      ??? magnesium oxide-Mg AA chelate 133 mg Tab Take 1 tablet by mouth Two (2) times a day. 60 tablet 3     No current facility-administered medications for this visit.          PHYSICAL EXAM:  BP 155/90  - Pulse 108  - Temp 37.1 ??C (98.8 ??F) (Tympanic)  - Ht 152.4 cm (5')  - Wt 73.2 kg (161 lb 6.4 oz)  - BMI 31.52 kg/m??     General Appearance:  NAD, well appearing and well nourished.   HEENT:  [place]/AT. Well hydrated moist mucous membranes of the oral cavity. No scleral icterus. No cervical lymphadenopathy.   Pulmonary:    Normal respiratory effort. CTAB, without wheezes/crackles/rhonchi. Good air movement.    Cardiovascular:  Regular rate and rhythm, no murmur noted.   Extremities No edema.    Abdomen:   Normoactive bowel sounds, abdomen soft, non-tender and not distended, no Hepatosplenomegaly or masses. Abdominal scar well healed without hernia.    Musculoskeletal: No joint tenderness, full ROM. Normal gait.    Skin: Skin color, texture, turgor normal, no rashes or lesions.   Neurologic: Grossly intact.   Psychiatric: Judgement and insight appropriate.        LAB RESULTS:  All lab results last 24 hours:    Recent Results (from the past 48 hour(s))   Comprehensive Metabolic Panel    Collection Time: 01/15/18  9:06 AM   Result Value Ref Range    Sodium 142 135 - 145 mmol/L    Potassium 4.8 3.5 - 5.0 mmol/L    Chloride 111 (H) 98 - 107 mmol/L    CO2 21.0 (L) 22.0 - 30.0 mmol/L    BUN 28 (H) 7 - 21 mg/dL    Creatinine 1.61 (H) 0.60 - 1.00 mg/dL    BUN/Creatinine Ratio 14     EGFR MDRD Non Af Amer 25 (L) >=60 mL/min/1.63m2    EGFR MDRD Af Amer 30 (L) >=60 mL/min/1.62m2    Anion Gap 10 9 - 15 mmol/L    Glucose 90 65 - 179 mg/dL    Calcium 8.8 8.5 - 09.6 mg/dL    Albumin 3.8 3.5 - 5.0 g/dL    Total Protein 7.2 6.5 - 8.3 g/dL    Total Bilirubin 0.5 0.0 - 1.2 mg/dL    AST 18 14 - 38 U/L    ALT 16 15 - 48 U/L    Alkaline Phosphatase 60 38 - 126 U/L   Bilirubin, Direct    Collection Time: 01/15/18  9:06 AM   Result Value Ref Range    Bilirubin, Direct 0.30 0.00 - 0.40 mg/dL   Phosphorus Level    Collection Time: 01/15/18  9:06 AM   Result Value Ref Range    Phosphorus 4.3 2.9 - 4.7 mg/dL   Magnesium Level    Collection Time: 01/15/18  9:06 AM   Result Value Ref Range    Magnesium 0.8 (LL) 1.6 - 2.2 mg/dL   Gamma GT    Collection Time: 01/15/18  9:06 AM  Result Value Ref Range    GGT 19 11 - 48 U/L   CBC w/ Differential    Collection Time: 01/15/18  9:07 AM   Result Value Ref Range    WBC 4.8 4.5 - 11.0 10*9/L    RBC 3.27 (L) 4.00 - 5.20 10*12/L    HGB 9.7 (L) 12.0 - 16.0 g/dL    HCT 04.5 (L) 40.9 - 46.0 %    MCV 93.0 80.0 - 100.0 fL    MCH 29.7 26.0 - 34.0 pg    MCHC 32.0 31.0 - 37.0 g/dL    RDW 81.1 (H) 91.4 - 15.0 %    MPV 8.7 7.0 - 10.0 fL    Platelet 108 (L) 150 - 440 10*9/L    Neutrophils % 76.2 %    Lymphocytes % 12.9 %    Monocytes % 6.4 %    Eosinophils % 2.8 %    Basophils % 0.1 %    Absolute Neutrophils 3.6 2.0 - 7.5 10*9/L    Absolute Lymphocytes 0.6 (L) 1.5 - 5.0 10*9/L    Absolute Monocytes 0.3 0.2 - 0.8 10*9/L    Absolute Eosinophils 0.1 0.0 - 0.4 10*9/L    Absolute Basophils 0.0 0.0 - 0.1 10*9/L    Large Unstained Cells 2 0 - 4 %    Macrocytosis Slight (A) Not Present    Hypochromasia Slight (A) Not Present         IMAGING:  Xr Chest 2 Views  Result Date: 01/15/2018  FINDINGS: Lungs well inflated with exaggerated AP dimension. No pulmonary edema, or focal consolidation. No pneumothoraces or pleural effusions. Cardiomediastinal silhouette unchanged, including mildly prominent cardiac silhouette, and ectatic/tortuous thoracic aorta. Unchanged multilevel remote right rib fractures and lower thoracic compression deformities. No acute osseous abnormalities.       US Liver Transplant  Result Date: 01/15/2018  --Patent hepatic transplant vasculature.   --Stable resistive indices in the hepatic transplant arteries, within normal limits.   --Pneumobilia, seen on prior.   --Bilateral echogenic kidneys, likely related to medical renal disease.   _______________________________________________    Patient was also seen by Juanda Bond, BSN - NP student.      Gertie Fey, DNP, APRN, FNP-C  [facility]  [address]  [place], [place]  [address]

## 2018-01-15 NOTE — Unmapped (Signed)
Patient reports that she has had changes to her medications.

## 2018-01-18 NOTE — Unmapped (Signed)
[facility] Refill Coordination Note  Specialty Medication(s): Neoral   Additional Medications shipped: Magnesium    Katherine Norton, DOB: [birth year]  Phone: [phone] (home) , Alternate phone contact: N/A  Phone or address changes today?: No  All above HIPAA information was verified with patient.  Shipping Address: [address]  [place]  [address]   Insurance changes? No    Completed refill call assessment today to schedule patient's medication shipment from the [facility] [phone]).      Confirmed the medication and dosage are correct and have not changed: Yes, regimen is correct and unchanged.    Confirmed patient started or stopped the following medications in the past month:  No, there are no changes reported at this time.    Are you tolerating your medication?:  Avaline reports tolerating the medication.    ADHERENCE        Did you miss any doses in the past 4 weeks? No missed doses reported.    FINANCIAL/SHIPPING    Delivery Scheduled: Yes, Expected medication delivery date: 01/23/18     The patient will receive an FSI print out for each medication shipped and additional FDA Medication Guides as required.  Patient education from [place] or Robet Leu may also be included in the shipment    [place] did not have any additional questions at this time.    Delivery address validated in FSI scheduling system: Yes, address listed in FSI is correct.    We will follow up with patient monthly for standard refill processing and delivery.      Thank you,  Rollen Sox   [facility] Specialty Pharmacist

## 2018-01-22 MED FILL — NEORAL/25MG/CAP: NEORAL/25MG/CAP | 30 days supply | Qty: 120 | Fill #0

## 2018-01-22 MED FILL — MG PLUS PROTEIN 133 MG/133 MG/TABS: MG PLUS PROTEIN 133 MG/133 MG/TABS | 30 days supply | Qty: 60 | Fill #0

## 2018-01-31 NOTE — Unmapped (Signed)
Patient complaining of falling out three times now over the past several months.  Flu diagnosed last Thursday. Patient started magnesium pills 01/24/18.  Let her know this could be the cause a low magnesium.  Reported her PCP checked her mag level on Monday and assessed her for this.  Sent records request.

## 2018-02-01 LAB — CBC: PLATELET COUNT: 154 10*9/L

## 2018-02-01 LAB — MAGNESIUM: Lab: 1.8 — AB

## 2018-02-01 LAB — PLATELET COUNT: Lab: 154

## 2018-02-02 NOTE — Unmapped (Signed)
Confirmed with patient her mag level is normal.

## 2018-02-20 ENCOUNTER — Encounter (HOSPITAL_BASED_OUTPATIENT_CLINIC_OR_DEPARTMENT_OTHER): Payer: Self-pay | Admitting: Emergency Medicine

## 2018-02-20 ENCOUNTER — Other Ambulatory Visit (HOSPITAL_BASED_OUTPATIENT_CLINIC_OR_DEPARTMENT_OTHER): Payer: Self-pay | Admitting: Family Medicine

## 2018-02-20 ENCOUNTER — Emergency Department (HOSPITAL_BASED_OUTPATIENT_CLINIC_OR_DEPARTMENT_OTHER)
Admission: EM | Admit: 2018-02-20 | Discharge: 2018-02-20 | Disposition: A | Discharge status: Home / Self Care | Payer: Medicaid Other | Attending: Emergency Medicine | Admitting: Emergency Medicine

## 2018-02-20 ENCOUNTER — Emergency Department (HOSPITAL_BASED_OUTPATIENT_CLINIC_OR_DEPARTMENT_OTHER): Payer: Medicaid Other

## 2018-02-20 ENCOUNTER — Other Ambulatory Visit: Payer: Self-pay

## 2018-02-20 DIAGNOSIS — Z91040 Latex allergy status: Secondary | ICD-10-CM | POA: Diagnosis not present

## 2018-02-20 DIAGNOSIS — R05 Cough: Secondary | ICD-10-CM | POA: Diagnosis present

## 2018-02-20 DIAGNOSIS — Z944 Liver transplant status: Secondary | ICD-10-CM | POA: Insufficient documentation

## 2018-02-20 DIAGNOSIS — I10 Essential (primary) hypertension: Secondary | ICD-10-CM | POA: Diagnosis not present

## 2018-02-20 DIAGNOSIS — Z79899 Other long term (current) drug therapy: Secondary | ICD-10-CM | POA: Diagnosis not present

## 2018-02-20 DIAGNOSIS — Z1231 Encounter for screening mammogram for malignant neoplasm of breast: Secondary | ICD-10-CM

## 2018-02-20 DIAGNOSIS — E119 Type 2 diabetes mellitus without complications: Secondary | ICD-10-CM | POA: Insufficient documentation

## 2018-02-20 DIAGNOSIS — B9789 Other viral agents as the cause of diseases classified elsewhere: Secondary | ICD-10-CM

## 2018-02-20 DIAGNOSIS — F329 Major depressive disorder, single episode, unspecified: Secondary | ICD-10-CM | POA: Insufficient documentation

## 2018-02-20 DIAGNOSIS — J069 Acute upper respiratory infection, unspecified: Secondary | ICD-10-CM | POA: Insufficient documentation

## 2018-02-20 HISTORY — DX: Gout, unspecified: M10.9

## 2018-02-20 LAB — COMPREHENSIVE METABOLIC PANEL
ALBUMIN: 3.7 g/dL (ref 3.5–5.0)
ALT: 10 U/L — ABNORMAL LOW (ref 14–54)
ANION GAP: 9 (ref 5–15)
AST: 19 U/L (ref 15–41)
Alkaline Phosphatase: 79 U/L (ref 38–126)
BUN: 29 mg/dL — ABNORMAL HIGH (ref 6–20)
CO2: 19 mmol/L — ABNORMAL LOW (ref 22–32)
Calcium: 8.6 mg/dL — ABNORMAL LOW (ref 8.9–10.3)
Chloride: 106 mmol/L (ref 101–111)
Creatinine, Ser: 2.08 mg/dL — ABNORMAL HIGH (ref 0.44–1.00)
GFR calc non Af Amer: 24 mL/min — ABNORMAL LOW (ref >60)
GFR, EST AFRICAN AMERICAN: 28 mL/min — AB (ref >60)
GLUCOSE: 118 mg/dL — AB (ref 65–99)
POTASSIUM: 5 mmol/L (ref 3.5–5.1)
SODIUM: 134 mmol/L — AB (ref 135–145)
Total Bilirubin: 0.7 mg/dL (ref 0.3–1.2)
Total Protein: 7.8 g/dL (ref 6.5–8.1)

## 2018-02-20 LAB — CBC
HCT: 28.7 % — ABNORMAL LOW (ref 36.0–46.0)
Hemoglobin: 9.8 g/dL — ABNORMAL LOW (ref 12.0–15.0)
MCH: 31.4 pg (ref 26.0–34.0)
MCHC: 34.1 g/dL (ref 30.0–36.0)
MCV: 92 fL (ref 78.0–100.0)
Platelets: 94 10*3/uL — ABNORMAL LOW (ref 150–400)
RBC: 3.12 MIL/uL — ABNORMAL LOW (ref 3.87–5.11)
RDW: 12.7 % (ref 11.5–15.5)
WBC: 3.7 10*3/uL — ABNORMAL LOW (ref 4.0–10.5)

## 2018-02-20 MED ORDER — BENZONATATE 100 MG PO CAPS: 100.0000 mg | ORAL_CAPSULE | Freq: Three times a day (TID) | ORAL | 0 refills | Status: DC

## 2018-02-20 MED ORDER — ALBUTEROL SULFATE HFA 108 (90 BASE) MCG/ACT IN AERS
2.0000 | INHALATION_SPRAY | RESPIRATORY_TRACT | Status: DC
  Administered 2018-02-20: 2 via RESPIRATORY_TRACT
  Filled 2018-02-20: qty 6.7

## 2018-02-20 MED FILL — BENZONATATE 100 MG CAP: 100 | 7 days supply | Qty: 21 | Fill #0

## 2018-02-20 NOTE — ED Provider Notes (Signed)
[facility] EMERGENCY DEPARTMENT Provider Note   CSN: [id number] Arrival date & time: 02/20/18  8338     History   Chief Complaint Chief Complaint  Patient presents with  . Cough    HPI Tami [name] is a 62 y.o. female.  HPI 62 year old female with a history of liver transplant, chronic renal insufficiency, history of blood transfusions presents with a dry cough for the past several days with some mild exertional shortness of breath.  No orthopnea.  Cough is minimally productive.  No unilateral leg swelling.  No history of DVT or pulmonary embolism.  She reports a history of chronic renal insufficiency but continues to make urine.  No history of dialysis.  She follows with a liver transplant specialist at [facility].  She has a nephrologist locally.  She is been compliant with her medications.  No other complaints at this time.  Denies fevers  Past Medical History:  Diagnosis Date  . Arthritis   . Gout   . Hepatitis, autoimmune ([place]) 12/08/2011  . Hypertension     Patient Active Problem List   Diagnosis Date Noted  . Acute renal failure ([place])   . Hyperkalemia   . Renal atrophy, right   . Acute kidney injury ([place]) 10/13/2017  . Encounter for therapeutic drug level monitoring 10/22/2014  . DDD (degenerative disc disease), lumbosacral 01/02/2014  . Diabetes ([place]) 01/02/2014  . Disorder of sacrum 01/02/2014  . BP (high blood pressure) 01/02/2014  . Back disorder 01/02/2014  . Thoracic and lumbosacral neuritis 01/02/2014  . Osteoarthritis of right knee 09/13/2013  . Chronic low back pain 09/13/2013  . Arthritis of knee, degenerative 09/13/2013  . Body aches 07/26/2013  . Pain in thoracic spine 07/26/2013  . Extremity pain 05/28/2013  . Clinical depression 03/30/2013  . HLD (hyperlipidemia) 03/30/2013  . Cannot sleep 03/30/2013  . 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 H/O liver transplant ([place]) 03/30/2013  . Malaise and fatigue 03/30/2013  . OP (osteoporosis)  03/30/2013  . Arthralgia of lower leg 03/30/2013  . Breath shortness 03/30/2013  . Avitaminosis D 03/30/2013  . Hepatitis, autoimmune ([place]) 12/08/2011  . Autoimmune hepatitis ([place]) 12/08/2011  . Anemia of renal disease 09/28/2011  . Pancytopenia ([place]) 09/28/2011  . Bone marrow failure ([place]) 09/28/2011  . Encounter for general adult medical examination without abnormal findings 07/14/2010    Past Surgical History:  Procedure Laterality Date  . ABDOMINAL HYSTERECTOMY    . LIVER TRANSPLANT     1996  . TONSILLECTOMY       OB History   None      Home Medications    Prior to Admission medications   Medication Sig Start Date End Date Taking? Authorizing Provider  albuterol (PROVENTIL HFA;VENTOLIN HFA) 108 (90 Base) MCG/ACT inhaler Inhale into the lungs. 03/24/16 03/24/17  [provider]  alendronate (FOSAMAX) 70 MG tablet Take 70 mg by mouth once a week. 10/07/15   [provider]  allopurinol (ZYLOPRIM) 100 MG tablet Take 100 mg by mouth daily. 03/18/15   [provider]  amLODipine (NORVASC) 5 MG tablet Take 5 mg by mouth daily. 12/28/15   [provider]  Ascorbic Acid (VITAMIN C) 250 MG CHEW Chew 1 tablet by mouth every morning.     [provider]  azithromycin (ZITHROMAX Z-PAK) 250 MG tablet Take as directed on package. 10/27/17   [place], Holli Humbles, NP  calcitonin, salmon, (MIACALCIN/FORTICAL) 200 UNIT/ACT nasal spray Place 1 spray into alternate nostrils daily.  [provider]  Calcium Carbonate-Vit D-Min (CALCIUM 1200 PO) Take 1 tablet by mouth 2 (two) times daily.     [provider]  cycloSPORINE modified (NEORAL) 25 MG capsule Take 50 mg by mouth 2 (two) times daily.     [provider]  diclofenac sodium (VOLTAREN) 1 % GEL Note:Apply 4 grams to painful knees, up to 4 times daily, as needed for pain 10/14/15   [provider]  gabapentin (NEURONTIN) 300 MG capsule Take 1 capsule (300 mg total) by  mouth 2 (two) times daily. 04/15/15   [name], MD  metoprolol succinate (TOPROL-XL) 50 MG 24 hr tablet Take 50 mg by mouth daily. 03/18/15   [provider]  Multiple Vitamins-Minerals (MULTIVITAMIN WITH MINERALS) tablet Take 1 tablet by mouth daily.      [provider]  ondansetron (ZOFRAN-ODT) 4 MG disintegrating tablet Take 4 mg by mouth every 8 (eight) hours as needed for nausea or vomiting.    [provider]  Oxycodone HCl 10 MG TABS Take 10 mg by mouth 4 (four) times daily as needed (pain).  03/15/17   [provider]  promethazine (PHENERGAN) 12.5 MG tablet Take 1 tablet (12.5 mg total) by mouth every 8 (eight) hours as needed for nausea. 10/27/17 01/22/19  [place], Holli Humbles, NP  Vitamin D, Ergocalciferol, (DRISDOL) 50000 units CAPS capsule Take 50,000 Units by mouth every 14 (fourteen) days. 11/10/15   [provider]    Family History Family History  Problem Relation Age of Onset  . Stroke Mother   . Cancer Father     Social History Social History   Tobacco Use  . Smoking status: Never Smoker  . Smokeless tobacco: Never Used  . Tobacco comment: never used tobacco  Substance Use Topics  . Alcohol use: Not Currently    Alcohol/week: 0.0 oz  . Drug use: No     Allergies   Iodinated diagnostic agents; Ioxaglate; and Latex   Review of Systems Review of Systems  All other systems reviewed and are negative.    Physical Exam Updated Vital Signs BP (!) 152/85   Pulse 87   Temp 98.8 F (37.1 C) (Oral)   Resp 18   Ht 5' (1.524 m)   Wt 70.8 kg (156 lb)   SpO2 94%   BMI 30.47 kg/m   Physical Exam  Constitutional: She is oriented to person, place, and time. She appears well-developed and well-nourished. No distress.  HENT:  Head: Normocephalic and atraumatic.  Eyes: EOM are normal.  Neck: Normal range of motion.  Cardiovascular: Normal rate, regular rhythm and normal heart sounds.  Pulmonary/Chest: Effort normal  and breath sounds normal.  Abdominal: Soft. She exhibits no distension. There is no tenderness.  Musculoskeletal: Normal range of motion.  Neurological: She is alert and oriented to person, place, and time.  Skin: Skin is warm and dry.  Psychiatric: She has a normal mood and affect. Judgment normal.  Nursing note and vitals reviewed.    ED Treatments / Results  Labs (all labs ordered are listed, but only abnormal results are displayed) Labs Reviewed  CBC - Abnormal; Notable for the following components:      Result Value   WBC 3.7 (*)    RBC 3.12 (*)    Hemoglobin 9.8 (*)    HCT 28.7 (*)    Platelets 94 (*)    All other components within normal limits  COMPREHENSIVE METABOLIC PANEL - Abnormal; Notable for the following  components:   Sodium 134 (*)    CO2 19 (*)    Glucose, Bld 118 (*)    BUN 29 (*)    Creatinine, Ser 2.08 (*)    Calcium 8.6 (*)    ALT 10 (*)    GFR calc non Af Amer 24 (*)    GFR calc Af Amer 28 (*)    All other components within normal limits    ALT  Date Value Ref Range Status  02/20/2018 10 (L) 14 - 54 U/L Final  10/14/2017 10 (L) 14 - 54 U/L Final  10/13/2017 6 (L) 14 - 54 U/L Final  04/11/2017 8 0 - 55 U/L Final  01/09/2017 16 0 - 55 U/L Final  11/23/2016 9 0 - 55 U/L Final  09/22/2016 9 0 - 55 U/L Final   ALT(SGPT)  Date Value Ref Range Status  10/13/2017 15 10 - 47 U/L Final  07/14/2017 18 10 - 47 U/L Final    AST  Date Value Ref Range Status  02/20/2018 19 15 - 41 U/L Final  10/14/2017 19 15 - 41 U/L Final  10/13/2017 30 15 - 41 U/L Final  10/13/2017 18 11 - 38 U/L Final  07/14/2017 26 11 - 38 U/L Final  04/11/2017 16 5 - 34 U/L Final  01/09/2017 22 5 - 34 U/L Final  11/23/2016 16 5 - 34 U/L Final  09/22/2016 16 5 - 34 U/L Final   BUN  Date Value Ref Range Status  02/20/2018 29 (H) 6 - 20 mg/dL Final  10/16/2017 38 (H) 6 - 20 mg/dL Final  10/15/2017 52 (H) 6 - 20 mg/dL Final  10/14/2017 57 (H) 6 - 20 mg/dL Final  04/11/2017  33.4 (H) 7.0 - 26.0 mg/dL Final  01/09/2017 27.1 (H) 7.0 - 26.0 mg/dL Final  11/23/2016 26.1 (H) 7.0 - 26.0 mg/dL Final  09/22/2016 31.8 (H) 7.0 - 26.0 mg/dL Final   BUN, Bld  Date Value Ref Range Status  10/13/2017 58 (H) 7 - 22 mg/dL Final  07/14/2017 20 7 - 22 mg/dL Final   Creatinine  Date Value Ref Range Status  04/11/2017 1.8 (H) 0.6 - 1.1 mg/dL Final  01/09/2017 1.9 (H) 0.6 - 1.1 mg/dL Final  11/23/2016 1.9 (H) 0.6 - 1.1 mg/dL Final  09/22/2016 1.8 (H) 0.6 - 1.1 mg/dL Final   Creat  Date Value Ref Range Status  10/13/2017 5.9 (HH) 0.6 - 1.2 mg/dl Final  07/14/2017 1.6 (H) 0.6 - 1.2 mg/dl Final   Creatinine, Ser  Date Value Ref Range Status  02/20/2018 2.08 (H) 0.44 - 1.00 mg/dL Final  10/16/2017 3.29 (H) 0.44 - 1.00 mg/dL Final  10/15/2017 3.97 (H) 0.44 - 1.00 mg/dL Final  10/14/2017 5.03 (H) 0.44 - 1.00 mg/dL Final      EKG None  Radiology Dg Chest 2 View  Result Date: 02/20/2018 CLINICAL DATA:  Cough and shortness of breath EXAM: CHEST - 2 VIEW COMPARISON:  October 13, 2017 FINDINGS: There is no edema or consolidation. The heart size and pulmonary vascularity are normal. No adenopathy. There is anterior wedging of several lower thoracic vertebral bodies, stable. IMPRESSION: No edema or consolidation. Electronically Signed   By: Lowella Grip III M.D.   On: 02/20/2018 09:19    Procedures Procedures (including critical care time)  Medications Ordered in ED Medications  albuterol (PROVENTIL HFA;VENTOLIN HFA) 108 (90 Base) MCG/ACT inhaler 2 puff (has no administration in time range)     Initial Impression / Assessment and  Plan / ED Course  I have reviewed the triage vital signs and the nursing notes.  Pertinent labs & imaging results that were available during my care of the patient were reviewed by me and considered in my medical decision making (see chart for details).     Baseline renal insufficiency for the patient.  LFTs remain normal.  Chest  x-ray without acute abnormality.  I personally reviewed the patient's chest x-ray.  Likely bronchitis with bronchospasm.  Some mild improvement with bronchodilators.  Patient will be sent home with Tessalon Perles in addition to albuterol.  Overall well-appearing at this time and no indication for additional work-up or acute hospitalization.  No increased work of breathing.  No hypoxia noted while in the emergency department.  Suspect spurious reading of 89 on arrival to the ER.  She has had no pulse ox below the level of 94% in the emergency department.  She is ambulated to the bathroom without difficulty  Final Clinical Impressions(s) / ED Diagnoses   Final diagnoses:  None    ED Discharge Orders    None       Jola Schmidt, MD 02/20/18 1154

## 2018-02-20 NOTE — ED Triage Notes (Signed)
Dry cough x3 days with SOB.

## 2018-02-23 ENCOUNTER — Ambulatory Visit (HOSPITAL_BASED_OUTPATIENT_CLINIC_OR_DEPARTMENT_OTHER): Payer: Medicaid Other

## 2018-02-26 NOTE — Unmapped (Signed)
[facility] Refill Coordination Note    Specialty Medication(s) to be Shipped:   Transplant: Neoral 25mg      Katherine Norton, DOB: [birth year]  Phone: [phone] (home)   Shipping Address: [address]  [place] [address]    All above HIPAA information was verified with patient.     Completed refill call assessment today to schedule patient's medication shipment from the [facility] [phone]).       Specialty medication(s) and dose(s) confirmed: Regimen is correct and unchanged.   Changes to medications: Reina reports no changes reported at this time.  Changes to insurance: No  Questions for the pharmacist: No    The patient will receive an FSI print out for each medication shipped and additional FDA Medication Guides as required.  Patient education from [place] or Robet Leu may also be included in the shipment.    DISEASE-SPECIFIC INFORMATION        N/A    ADHERENCE     Medication Adherence    Patient reported X missed doses in the last month:  0         MEDICARE PART B DOCUMENTATION     Neoral 25mg : Patient has 30 capsules on hand.    SHIPPING     Shipping address confirmed in FSI.     Delivery Scheduled: Yes, Expected medication delivery date: 03/02/2018 via UPS or courier.     Katherine Norton   [facility] Specialty Technician

## 2018-03-01 MED FILL — NEORAL/25MG/CAP: NEORAL/25MG/CAP | 30 days supply | Qty: 120 | Fill #1

## 2018-03-01 MED FILL — MG PLUS PROTEIN 133 MG/133 MG/TABS: MG PLUS PROTEIN 133 MG/133 MG/TABS | 30 days supply | Qty: 60 | Fill #1

## 2018-03-10 LAB — COMPREHENSIVE METABOLIC PANEL
A/G RATIO: 1.2 (ref 1.2–2.2)
ALBUMIN: 3.9 g/dL (ref 3.6–4.8)
ALKALINE PHOSPHATASE: 86 IU/L (ref 39–117)
ALT (SGPT): 7 IU/L (ref 0–32)
AST (SGOT): 14 IU/L (ref 0–40)
BUN / CREAT RATIO: 13 (ref 12–28)
CHLORIDE: 104 mmol/L (ref 96–106)
CO2: 18 mmol/L — ABNORMAL LOW (ref 20–29)
CREATININE: 2.23 mg/dL — ABNORMAL HIGH (ref 0.57–1.00)
GFR MDRD AF AMER: 26 mL/min/{1.73_m2} — ABNORMAL LOW
GFR MDRD NON AF AMER: 23 mL/min/{1.73_m2} — ABNORMAL LOW
GLOBULIN, TOTAL: 3.3 g/dL (ref 1.5–4.5)
GLUCOSE: 158 mg/dL — ABNORMAL HIGH (ref 65–99)
POTASSIUM: 4.7 mmol/L (ref 3.5–5.2)
SODIUM: 138 mmol/L (ref 134–144)
TOTAL PROTEIN: 7.2 g/dL (ref 6.0–8.5)

## 2018-03-10 LAB — CBC W/ DIFFERENTIAL
BASOPHILS ABSOLUTE COUNT: 0 10*3/uL (ref 0.0–0.2)
BASOPHILS RELATIVE PERCENT: 0 %
EOSINOPHILS ABSOLUTE COUNT: 0.2 10*3/uL (ref 0.0–0.4)
EOSINOPHILS RELATIVE PERCENT: 4 %
HEMATOCRIT: 30.8 % — ABNORMAL LOW (ref 34.0–46.6)
HEMOGLOBIN: 10.4 g/dL — ABNORMAL LOW (ref 11.1–15.9)
IMMATURE GRANULOCYTES: 1 %
LYMPHOCYTES ABSOLUTE COUNT: 1.2 10*3/uL (ref 0.7–3.1)
LYMPHOCYTES RELATIVE PERCENT: 28 %
MEAN CORPUSCULAR HEMOGLOBIN CONC: 33.8 g/dL (ref 31.5–35.7)
MEAN CORPUSCULAR HEMOGLOBIN: 30.3 pg (ref 26.6–33.0)
MEAN CORPUSCULAR VOLUME: 90 fL (ref 79–97)
MONOCYTES ABSOLUTE COUNT: 0.3 10*3/uL (ref 0.1–0.9)
MONOCYTES RELATIVE PERCENT: 8 %
NEUTROPHILS ABSOLUTE COUNT: 2.5 10*3/uL (ref 1.4–7.0)
NEUTROPHILS RELATIVE PERCENT: 59 %
PLATELET COUNT: 171 10*3/uL (ref 150–379)
RED CELL DISTRIBUTION WIDTH: 13.7 % (ref 12.3–15.4)
WHITE BLOOD CELL COUNT: 4.2 10*3/uL (ref 3.4–10.8)

## 2018-03-10 LAB — MAGNESIUM: Lab: 1.7

## 2018-03-10 LAB — BILIRUBIN DIRECT: Lab: 0.11

## 2018-03-10 LAB — LYMPHOCYTES ABSOLUTE COUNT: Lab: 1.2

## 2018-03-10 LAB — BUN / CREAT RATIO: Lab: 13

## 2018-03-10 LAB — GAMMA GLUTAMYL TRANSFERASE: Lab: 13

## 2018-03-10 LAB — PHOSPHORUS, SERUM: Lab: 5.1 — ABNORMAL HIGH

## 2018-03-10 LAB — CYCLOSPORINE, LC-MS/MS: Lab: 101

## 2018-03-13 NOTE — Unmapped (Signed)
Attempted to call patient to discuss elevated creatinine and drug level.  Automatic message on phone prevented pheon from ringing and stated number is not accepting calls at this time please try call again at a later time.  Sent MyChart message to patient instead.

## 2018-03-16 NOTE — Unmapped (Addendum)
Spoke with patient and she had a new phone number. Took medication at 2200 on 03/08/18 so it was a 10 hour drug trough. Diagnosed with bronchitis again and was prescribed Amoxacillin unsure of current dose.  Fasting prior to lab draw. Per NP Renee Rival labs went up due infection and to repeat labs in 1-2 weeks.  Patient verbalized understanding.      Amoxicillin and Clavulanate Potassium 875 mg-125 mg taken one pill two times a day started 03/03/18 patient with 4 pills left.  Per NP Martin-Velez patient to stop medication since she now feels fine. Patient verbalized understanding.

## 2018-03-26 NOTE — Unmapped (Signed)
[facility] Refill Coordination Note  Medication: NEORAL    Unable to reach patient to schedule shipment for medication being filled at [facility]. 2#S disconnected, new # no vm.  As this is the 3rd unsuccessful attempt to reach the patient, no additional phone call attempts will be made at this time.      Phone numbers attempted: old#s: [phone] and [phone], no vm se tup: [phone]  Last scheduled delivery: 03/01/18    Please call the [facility] at [phone] (option [phone]) should you have any further questions.      Thanks,  [facility] Specialty Team

## 2018-03-27 NOTE — Unmapped (Signed)
[facility] Refill Coordination Note  Specialty Medication(s): Neoral 25mg   Additional Medications shipped: Pt states that she does not need the Mg Plus at this time.    Katherine Norton, DOB: 08/14/1956  Phone: There are no phone numbers on file., Alternate phone contact: N/A  Phone or address changes today?: No  All above HIPAA information was verified with patient.  Shipping Address: [address]  [place] [address]   Insurance changes? No    Completed refill call assessment today to schedule patient's medication shipment from the [facility] [phone]).      Confirmed the medication and dosage are correct and have not changed: Yes, regimen is correct and unchanged.    Confirmed patient started or stopped the following medications in the past month:  No, there are no changes reported at this time.    Are you tolerating your medication?:  Suheily reports tolerating the medication.    ADHERENCE    (Below is required for Medicare Part B or Transplant patients only - per drug):   How many tablets were dispensed last month: 120  Patient currently has 10 remaining.    Did you miss any doses in the past 4 weeks? No missed doses reported.    FINANCIAL/SHIPPING    Delivery Scheduled: Yes, Expected medication delivery date: 03/29/2018     The patient will receive an FSI print out for each medication shipped and additional FDA Medication Guides as required.  Patient education from [place] or Robet Leu may also be included in the shipment    Kadynce did not have any additional questions at this time.    Delivery address validated in FSI scheduling system: Yes, address listed in FSI is correct.    We will follow up with patient monthly for standard refill processing and delivery.      Thank you,  Bret Stamour  Anders Grant   [facility] Specialty Pharmacist

## 2018-03-28 MED FILL — NEORAL/25MG/CAP: NEORAL/25MG/CAP | 30 days supply | Qty: 120 | Fill #2

## 2018-04-06 LAB — COMPREHENSIVE METABOLIC PANEL
A/G RATIO: 1.2 (ref 1.2–2.2)
ALBUMIN: 4 g/dL (ref 3.6–4.8)
ALKALINE PHOSPHATASE: 90 IU/L (ref 39–117)
ALT (SGPT): 6 IU/L (ref 0–32)
AST (SGOT): 14 IU/L (ref 0–40)
BLOOD UREA NITROGEN: 30 mg/dL — ABNORMAL HIGH (ref 8–27)
BUN / CREAT RATIO: 13 (ref 12–28)
CALCIUM: 9.2 mg/dL (ref 8.7–10.3)
CHLORIDE: 104 mmol/L (ref 96–106)
CO2: 21 mmol/L (ref 20–29)
CREATININE: 2.32 mg/dL — ABNORMAL HIGH (ref 0.57–1.00)
GFR MDRD AF AMER: 25 mL/min/{1.73_m2} — ABNORMAL LOW
GLOBULIN, TOTAL: 3.4 g/dL (ref 1.5–4.5)
GLUCOSE: 115 mg/dL — ABNORMAL HIGH (ref 65–99)
POTASSIUM: 5.1 mmol/L (ref 3.5–5.2)
SODIUM: 140 mmol/L (ref 134–144)
TOTAL PROTEIN: 7.4 g/dL (ref 6.0–8.5)

## 2018-04-06 LAB — GAMMA GLUTAMYL TRANSFERASE: Lab: 12

## 2018-04-06 LAB — PHOSPHORUS, SERUM: Lab: 4.2

## 2018-04-06 LAB — CBC W/ DIFFERENTIAL
BANDED NEUTROPHILS ABSOLUTE COUNT: 0 10*3/uL (ref 0.0–0.1)
BASOPHILS ABSOLUTE COUNT: 0 10*3/uL (ref 0.0–0.2)
EOSINOPHILS ABSOLUTE COUNT: 0.1 10*3/uL (ref 0.0–0.4)
EOSINOPHILS RELATIVE PERCENT: 4 %
HEMATOCRIT: 30.8 % — ABNORMAL LOW (ref 34.0–46.6)
HEMOGLOBIN: 10.3 g/dL — ABNORMAL LOW (ref 11.1–15.9)
IMMATURE GRANULOCYTES: 1 %
LYMPHOCYTES ABSOLUTE COUNT: 0.9 10*3/uL (ref 0.7–3.1)
MEAN CORPUSCULAR HEMOGLOBIN CONC: 33.4 g/dL (ref 31.5–35.7)
MEAN CORPUSCULAR HEMOGLOBIN: 29.3 pg (ref 26.6–33.0)
MEAN CORPUSCULAR VOLUME: 88 fL (ref 79–97)
MONOCYTES ABSOLUTE COUNT: 0.4 10*3/uL (ref 0.1–0.9)
MONOCYTES RELATIVE PERCENT: 13 %
NEUTROPHILS ABSOLUTE COUNT: 1.9 10*3/uL (ref 1.4–7.0)
NEUTROPHILS RELATIVE PERCENT: 56 %
PLATELET COUNT: 163 10*3/uL (ref 150–450)
RED BLOOD CELL COUNT: 3.51 x10E6/uL — ABNORMAL LOW (ref 3.77–5.28)
WHITE BLOOD CELL COUNT: 3.3 10*3/uL — ABNORMAL LOW (ref 3.4–10.8)

## 2018-04-06 LAB — BILIRUBIN DIRECT: Lab: 0.13

## 2018-04-06 LAB — MEAN CORPUSCULAR HEMOGLOBIN: Lab: 29.3

## 2018-04-06 LAB — GFR MDRD NON AF AMER: Lab: 22 — ABNORMAL LOW

## 2018-04-06 LAB — MAGNESIUM: Lab: 2

## 2018-04-07 LAB — CYCLOSPORINE, LC-MS/MS: Lab: 71 — ABNORMAL LOW

## 2018-04-09 NOTE — Unmapped (Signed)
Spoke with NP Martin-Velez about creatinine and no intervention at this time for it.

## 2018-04-10 ENCOUNTER — Other Ambulatory Visit: Payer: Self-pay | Admitting: Family

## 2018-04-10 DIAGNOSIS — N189 Chronic kidney disease, unspecified: Principal | ICD-10-CM

## 2018-04-10 DIAGNOSIS — D631 Anemia in chronic kidney disease: Secondary | ICD-10-CM

## 2018-04-11 ENCOUNTER — Inpatient Hospital Stay: Payer: Medicaid Other

## 2018-04-11 ENCOUNTER — Inpatient Hospital Stay: Payer: Medicaid Other | Attending: Family | Admitting: Family

## 2018-04-11 VITALS — BP 122/67 | HR 80 | Temp 98.0°F | Resp 18 | Wt 154.2 lb

## 2018-04-11 DIAGNOSIS — N189 Chronic kidney disease, unspecified: Principal | ICD-10-CM

## 2018-04-11 DIAGNOSIS — D631 Anemia in chronic kidney disease: Secondary | ICD-10-CM | POA: Diagnosis not present

## 2018-04-11 DIAGNOSIS — D61818 Other pancytopenia: Secondary | ICD-10-CM | POA: Diagnosis not present

## 2018-04-11 DIAGNOSIS — K754 Autoimmune hepatitis: Secondary | ICD-10-CM | POA: Diagnosis not present

## 2018-04-11 DIAGNOSIS — Z944 Liver transplant status: Secondary | ICD-10-CM | POA: Insufficient documentation

## 2018-04-11 DIAGNOSIS — R110 Nausea: Secondary | ICD-10-CM

## 2018-04-11 LAB — CMP (CANCER CENTER ONLY)
ALT: 15 U/L (ref 10–47)
ANION GAP: 13 (ref 5–15)
AST: 23 U/L (ref 11–38)
Albumin: 3.6 g/dL (ref 3.5–5.0)
Alkaline Phosphatase: 84 U/L (ref 26–84)
BUN: 27 mg/dL — ABNORMAL HIGH (ref 7–22)
CHLORIDE: 107 mmol/L (ref 98–108)
CO2: 26 mmol/L (ref 18–33)
Calcium: 9 mg/dL (ref 8.0–10.3)
Creatinine: 2.3 mg/dL — ABNORMAL HIGH (ref 0.60–1.20)
GLUCOSE: 161 mg/dL — AB (ref 73–118)
POTASSIUM: 5 mmol/L — AB (ref 3.3–4.7)
Sodium: 146 mmol/L — ABNORMAL HIGH (ref 128–145)
Total Bilirubin: 0.7 mg/dL (ref 0.2–1.6)
Total Protein: 8.4 g/dL — ABNORMAL HIGH (ref 6.4–8.1)

## 2018-04-11 LAB — CBC WITH DIFFERENTIAL (CANCER CENTER ONLY)
Basophils Absolute: 0 10*3/uL (ref 0.0–0.1)
Basophils Relative: 0 %
Eosinophils Absolute: 0 10*3/uL (ref 0.0–0.5)
Eosinophils Relative: 1 %
HEMATOCRIT: 32.9 % — AB (ref 34.8–46.6)
HEMOGLOBIN: 10.7 g/dL — AB (ref 11.6–15.9)
LYMPHS PCT: 19 %
Lymphs Abs: 0.6 10*3/uL — ABNORMAL LOW (ref 0.9–3.3)
MCH: 30.1 pg (ref 26.0–34.0)
MCHC: 32.5 g/dL (ref 32.0–36.0)
MCV: 92.4 fL (ref 81.0–101.0)
MONO ABS: 0.3 10*3/uL (ref 0.1–0.9)
MONOS PCT: 10 %
NEUTROS ABS: 2.4 10*3/uL (ref 1.5–6.5)
Neutrophils Relative %: 70 %
Platelet Count: 140 10*3/uL — ABNORMAL LOW (ref 145–400)
RBC: 3.56 MIL/uL — ABNORMAL LOW (ref 3.70–5.32)
RDW: 12.2 % (ref 11.1–15.7)
WBC Count: 3.4 10*3/uL — ABNORMAL LOW (ref 3.9–10.0)

## 2018-04-11 MED ORDER — PROMETHAZINE HCL 12.5 MG PO TABS: 12.5000 mg | ORAL_TABLET | Freq: Three times a day (TID) | ORAL | 0 refills | Status: DC | PRN

## 2018-04-11 NOTE — Progress Notes (Signed)
Hematology and Oncology Follow Up Visit  Korine Winton [id number] [birth year] 62 y.o. 04/11/2018   Principle Diagnosis:  Chronic pancytopenia  Status post liver transplant for autoimmune hepatitis Anemia of chronic kidney disease  Current Therapy:   Aranesp 300 mcg SQ as needed for hemoglobin less than 10    Interim History:  Ms. Timberlake is here today for follow-up. She is doing well and has no complaints at this time. Hgb is stable at 10.7.  She is now followed closely by nephrology and will see them again in August.  She has nausea at times which resolves after she takes Phenergan.  No fever, chills, cough, rash, dizziness, SOB, chest pain, palpitations, abdominal pain or changes in bowel or bladder habits.  No episodes of bleeding, no bruising or petechiae.  No swelling, tenderness, numbness or tingling in her extremities. No c/o pain.  No lymphadenopathy noted on exam.  He has maintained a good appetite and is staying well hydrated. Her weight is stable.   ECOG Performance Status: 1 - Symptomatic but completely ambulatory  Medications:  Allergies as of 04/11/2018      Reactions   Iodinated Diagnostic Agents    Ioxaglate    Latex       Medication List        Accurate as of 04/11/18 11:44 AM. Always use your most recent med list.          albuterol 108 (90 Base) MCG/ACT inhaler Commonly known as:  PROVENTIL HFA;VENTOLIN HFA Inhale into the lungs.   alendronate 70 MG tablet Commonly known as:  FOSAMAX Take 70 mg by mouth once a week.   allopurinol 100 MG tablet Commonly known as:  ZYLOPRIM Take 100 mg by mouth daily.   amLODipine 5 MG tablet Commonly known as:  NORVASC Take 5 mg by mouth daily.   azithromycin 250 MG tablet Commonly known as:  ZITHROMAX Z-PAK Take as directed on package.   benzonatate 100 MG capsule Commonly known as:  TESSALON Take 1 capsule (100 mg total) by mouth every 8 (eight) hours.   calcitonin (salmon) 200 UNIT/ACT nasal  spray Commonly known as:  MIACALCIN/FORTICAL Place 1 spray into alternate nostrils daily.   CALCIUM 1200 PO Take 1 tablet by mouth 2 (two) times daily.   gabapentin 300 MG capsule Commonly known as:  NEURONTIN Take 1 capsule (300 mg total) by mouth 2 (two) times daily.   metoprolol succinate 50 MG 24 hr tablet Commonly known as:  TOPROL-XL Take 50 mg by mouth daily.   multivitamin with minerals tablet Take 1 tablet by mouth daily.   NEORAL 25 MG capsule Generic drug:  cycloSPORINE modified Take 50 mg by mouth 2 (two) times daily.   Oxycodone HCl 10 MG Tabs Take 10 mg by mouth 4 (four) times daily as needed (pain).   promethazine 12.5 MG tablet Commonly known as:  PHENERGAN Take 1 tablet (12.5 mg total) by mouth every 8 (eight) hours as needed for nausea.   Vitamin C 250 MG Chew Chew 1 tablet by mouth every morning.   Vitamin D (Ergocalciferol) 50000 units Caps capsule Commonly known as:  DRISDOL Take 50,000 Units by mouth every 14 (fourteen) days.   VOLTAREN 1 % Gel Generic drug:  diclofenac sodium Note:Apply 4 grams to painful knees, up to 4 times daily, as needed for pain       Allergies:  Allergies  Allergen Reactions  . Iodinated Diagnostic Agents   . Ioxaglate   . Latex  Past Medical History, Surgical history, Social history, and Family History were reviewed and updated.  Review of Systems: All other 10 point review of systems is negative.   Physical Exam:  weight is 154 lb 4 oz (70 kg). Her oral temperature is 98 F (36.7 C). Her blood pressure is 122/67 and her pulse is 80. Her respiration is 18 and oxygen saturation is 99%.   Wt Readings from Last 3 Encounters:  04/11/18 154 lb 4 oz (70 kg)  02/20/18 156 lb (70.8 kg)  10/13/17 163 lb 9.3 oz (74.2 kg)    Ocular: Sclerae unicteric, pupils equal, round and reactive to light Ear-nose-throat: Oropharynx clear, dentition fair Lymphatic: No cervical, supraclavicular or axillary adenopathy Lungs  no rales or rhonchi, good excursion bilaterally Heart regular rate and rhythm, no murmur appreciated Abd soft, nontender, positive bowel sounds, no liver or spleen tip palpated on exam, no fluid wave  MSK no focal spinal tenderness, no joint edema Neuro: non-focal, well-oriented, appropriate affect Breasts: Deferred   Lab Results  Component Value Date   WBC 3.4 (L) 04/11/2018   HGB 10.7 (L) 04/11/2018   HCT 32.9 (L) 04/11/2018   MCV 92.4 04/11/2018   PLT 140 (L) 04/11/2018   Lab Results  Component Value Date   FERRITIN 710 (H) 10/13/2017   IRON 90 10/13/2017   TIBC 174 (L) 10/13/2017   UIBC 84 (L) 10/13/2017   IRONPCTSAT 52 10/13/2017   Lab Results  Component Value Date   RETICCTPCT 1.2 09/29/2015   RBC 3.56 (L) 04/11/2018   RETICCTABS 38.0 09/29/2015   No results found for: KPAFRELGTCHN, LAMBDASER, KAPLAMBRATIO No results found for: Kandis Cocking, IGMSERUM Lab Results  Component Value Date   TOTALPROTELP 9.4 (H) 11/19/2008   ALBUMINELP 41.1 (L) 11/19/2008   A1GS 4.0 11/19/2008   A2GS 7.1 11/19/2008   BETS 4.0 (L) 11/19/2008   BETA2SER 7.1 (H) 11/19/2008   GAMS 36.7 (H) 11/19/2008   MSPIKE NOT DET 11/19/2008   SPEI * 11/19/2008     Chemistry      Component Value Date/Time   NA 134 (L) 02/20/2018 0918   NA 140 10/13/2017 1106   NA 138 04/11/2017 1023   K 5.0 02/20/2018 0918   K 5.3 no visable hemolysis (H) 10/13/2017 1106   K 5.2 No visable hemolysis (H) 04/11/2017 1023   CL 106 02/20/2018 0918   CL 104 10/13/2017 1106   CO2 19 (L) 02/20/2018 0918   CO2 17 (L) 10/13/2017 1106   CO2 19 (L) 04/11/2017 1023   BUN 29 (H) 02/20/2018 0918   BUN 58 (H) 10/13/2017 1106   BUN 33.4 (H) 04/11/2017 1023   CREATININE 2.08 (H) 02/20/2018 0918   CREATININE 5.9 (HH) 10/13/2017 1106   CREATININE 1.8 (H) 04/11/2017 1023      Component Value Date/Time   CALCIUM 8.6 (L) 02/20/2018 0918   CALCIUM 8.4 10/13/2017 1106   CALCIUM 9.0 04/11/2017 1023   ALKPHOS 79 02/20/2018  0918   ALKPHOS 68 10/13/2017 1106   ALKPHOS 80 04/11/2017 1023   AST 19 02/20/2018 0918   AST 18 10/13/2017 1106   AST 16 04/11/2017 1023   ALT 10 (L) 02/20/2018 0918   ALT 15 10/13/2017 1106   ALT 8 04/11/2017 1023   BILITOT 0.7 02/20/2018 0918   BILITOT 0.50 10/13/2017 1106   BILITOT 0.54 04/11/2017 1023      Impression and Plan: Ms. Wisby is a very pleasant 62 yo African American female with anemia of chronic renal  disease. Hgb is now up to 10.7 and she states she is being followed closely by nephrology.  Phenergan was refilled today.  She is doing well and we will plan to see her back in another 3 months for follow-up.  She will contact our office with any questions or concerns. We can certainly see her sooner if need be.   Laverna Peace, NP 6/19/201911:44 AM

## 2018-04-27 NOTE — Unmapped (Signed)
[facility] Pharmacy Refill and Clinical Coordination Note  Medication(s): Neoral 25mg , Mg Plus Proteine 133mg     Katherine Norton, DOB: 03-May-1956  Phone: There are no phone numbers on file., Alternate phone contact: N/A  Shipping address: [address]  [place] [address]  Phone or address changes today?: No  All above HIPAA information verified.  Insurance changes? No    Completed refill and clinical call assessment today to schedule patient's medication shipment from the [facility] [phone]).      MEDICATION RECONCILIATION    Confirmed the medication and dosage are correct and have not changed: Yes, regimen is correct and unchanged.    Were there any changes to your medication(s) in the past month:  No, there are no changes reported at this time.    ADHERENCE    Is this medicine transplant or covered by Medicare Part B? Yes.    Neoral 25mg     Did you miss any doses in the past 4 weeks? No missed doses reported.  Adherence counseling provided? Not needed     SIDE EFFECT MANAGEMENT    Are you tolerating your medication?:  Katherine Norton reports tolerating the medication.  Side effect management discussed: None      Therapy is appropriate and should be continued.    Evidence of clinical benefit: See Epic note from 01/15/18      FINANCIAL/SHIPPING    Delivery Scheduled: Yes, Expected medication delivery date: 05/01/18   Additional medications refilled: No additional medications/refills needed at this time.    The patient will receive an FSI print out for each medication shipped and additional FDA Medication Guides as required.  Patient education from [place] or Robet Leu may also be included in the shipment.    Katherine Norton did not have any additional questions at this time.    Delivery address validated in FSI scheduling system: Yes, address listed above is correct.      We will follow up with patient monthly for standard refill processing and delivery.      Thank you,  [employer] [facility] Shared [facility] Specialty Pharmacist

## 2018-04-28 MED FILL — NEORAL/25MG/CAP: NEORAL/25MG/CAP | 30 days supply | Qty: 120 | Fill #3

## 2018-04-28 MED FILL — MG PLUS PROTEIN 133 MG/133 MG/TABS: MG PLUS PROTEIN 133 MG/133 MG/TABS | 30 days supply | Qty: 60 | Fill #2

## 2018-05-26 NOTE — Unmapped (Signed)
Current Outpatient Medications on File Prior to Visit   Medication Sig   ??? albuterol (PROVENTIL HFA;VENTOLIN HFA) 90 mcg/actuation inhaler Inhale 1-2 puffs every six (6) hours as needed for wheezing.   ??? alendronate (FOSAMAX) 70 MG tablet Take 70 mg by mouth once a week. Frequency:1XW   Dosage:70   MG  Instructions:  Note:Dose: 70MG    ??? allopurinol (ZYLOPRIM) 100 MG tablet Take 100 mg by mouth.   ??? amLODIPine (NORVASC) 5 MG tablet Take 5 mg by mouth daily.   ??? calcitonin, salmon, (MIACALCIN) 200 unit/actuation nasal spray 1 spray by Alternating Nares route once daily.    ??? calcium carbonate-vitamin D3 500-100 mg-unit Chew Frequency:   Dosage:0     Instructions:  Note:2 daily   ??? cholecalciferol, vitamin D3, (VITAMIN D3) 1,000 unit capsule Take 1,000 Units by mouth daily. Frequency:   Dosage:0   UNIT  Instructions:  Note:1 po daily   ??? citalopram (CELEXA) 10 MG tablet Take 10 mg by mouth once daily. Frequency:QD   Dosage:20   MG  Instructions:  Note:Dose: 20MG    ??? darbepoetin alfa-polysorbate (ARANESP, IN POLYSORBATE,) 200 mcg/0.4 mL Syrg Note:Once a month Dose: 200MCG/0.4   ??? ergocalciferol (VITAMIN D2) 50,000 unit capsule Take 50,000 Units by mouth once a week.    ??? fluticasone (FLONASE) 50 mcg/actuation nasal spray daily as needed.    ??? gabapentin (NEURONTIN) 300 MG capsule Take 300 mg by mouth daily as needed.    ??? hydrochlorothiazide (HYDRODIURIL) 12.5 MG tablet Take 12.5 mg by mouth daily. Frequency:QD   Dosage:12.5   MG  Instructions:  Note:Dose: 12.5 MG   ??? magnesium oxide-Mg AA chelate 133 mg Tab Take 1 tablet by mouth Two (2) times a day.   ??? metoprolol succinate (TOPROL-XL) 50 MG 24 hr tablet Take 50 mg by mouth daily.    ??? multivitamin with minerals tablet Take 1 tablet by mouth daily.    ??? NEORAL 25 mg capsule Take 2 capsules (50 mg total) by mouth Two (2) times a day.   ??? ondansetron (ZOFRAN-ODT) 4 MG disintegrating tablet Take 4 mg by mouth.   ??? oxyCODONE (ROXICODONE) 15 MG immediate release tablet Take 1 tablet (15 mg total) by mouth 4 (four) times a day as needed. (Patient taking differently: Take 10 mg by mouth 4 (four) times a day as needed. )   ??? promethazine (PHENERGAN) 12.5 MG tablet Take 12.5 mg by mouth every six (6) hours as needed.    ??? VOLTAREN 1 % gel Note:Apply 4 grams to painful knees, up to 4 times daily, as needed   ??? zolpidem (AMBIEN) 5 MG tablet Take 5 mg by mouth nightly as needed.      No current facility-administered medications on file prior to visit.

## 2018-05-31 NOTE — Unmapped (Signed)
[facility] Special[facility] Pharm[facility] Refill Coordination Note  Medication: Neoral 25mg      Unable to reach patient to schedule shipment for medication being filled at [facility]. Does not have voicemail set-up.  As this is the 3rd unsuccessful attempt to reach the patient, no additional phone call attempts will be made at this time.      Phone numbers attempted: [phone] (Mobile)    Last scheduled delivery: 04/28/2018    Please call the [facility] at [phone] (option 4) should you have any further questions.      Thanks,  [facility] Specialty Team

## 2018-06-02 DIAGNOSIS — J9601 Acute respiratory failure with hypoxia: Principal | ICD-10-CM

## 2018-06-02 LAB — URINALYSIS
BILIRUBIN UA: NEGATIVE
KETONES UA: NEGATIVE
NITRITE UA: NEGATIVE
PH UA: 6 (ref 5.0–9.0)
RBC UA: 36 /HPF — ABNORMAL HIGH (ref <=4)
SPECIFIC GRAVITY UA: 1.01 (ref 1.003–1.030)
SQUAMOUS EPITHELIAL: <1 /HPF (ref 0–5)
UROBILINOGEN UA: 0.2
WBC UA: 5 /HPF (ref 0–5)

## 2018-06-02 LAB — AMMONIA: Ammonia:SCnc:Pt:Plas:Qn:: <9 — ABNORMAL LOW

## 2018-06-02 LAB — COMPREHENSIVE METABOLIC PANEL
ALBUMIN: 3.6 g/dL (ref 3.5–5.0)
ALKALINE PHOSPHATASE: 49 U/L (ref 38–126)
ALT (SGPT): 16 U/L (ref 15–48)
ANION GAP: 16 mmol/L — ABNORMAL HIGH (ref 9–15)
AST (SGOT): 13 U/L — ABNORMAL LOW (ref 14–38)
BILIRUBIN TOTAL: 0.7 mg/dL (ref 0.0–1.2)
BLOOD UREA NITROGEN: 119 mg/dL — ABNORMAL HIGH (ref 7–21)
CALCIUM: 8.9 mg/dL (ref 8.5–10.2)
CHLORIDE: 100 mmol/L (ref 98–107)
CREATININE: 2.89 mg/dL — ABNORMAL HIGH (ref 0.60–1.00)
EGFR CKD-EPI AA FEMALE: 19 mL/min/{1.73_m2} — ABNORMAL LOW (ref >=60)
EGFR CKD-EPI NON-AA FEMALE: 17 mL/min/{1.73_m2} — ABNORMAL LOW (ref >=60)
GLUCOSE RANDOM: 95 mg/dL (ref 65–179)
POTASSIUM: 4.7 mmol/L (ref 3.5–5.0)
PROTEIN TOTAL: 6.9 g/dL (ref 6.5–8.3)

## 2018-06-02 LAB — CBC
HEMATOCRIT: 27.2 % — ABNORMAL LOW (ref 36.0–46.0)
HEMOGLOBIN: 8.9 g/dL — ABNORMAL LOW (ref 12.0–16.0)
MEAN CORPUSCULAR HEMOGLOBIN CONC: 32.6 g/dL (ref 31.0–37.0)
MEAN CORPUSCULAR HEMOGLOBIN: 30.7 pg (ref 26.0–34.0)
MEAN CORPUSCULAR VOLUME: 94.1 fL (ref 80.0–100.0)
PLATELET COUNT: 145 10*9/L — ABNORMAL LOW (ref 150–440)
RED BLOOD CELL COUNT: 2.89 10*12/L — ABNORMAL LOW (ref 4.00–5.20)
RED CELL DISTRIBUTION WIDTH: 18.1 % — ABNORMAL HIGH (ref 12.0–15.0)
WBC ADJUSTED: 11.6 10*9/L — ABNORMAL HIGH (ref 4.5–11.0)

## 2018-06-02 LAB — HEPATIC FUNCTION PANEL
ALBUMIN: 3.6 g/dL (ref 3.5–5.0)
ALKALINE PHOSPHATASE: 49 U/L (ref 38–126)
AST (SGOT): 13 U/L — ABNORMAL LOW (ref 14–38)
BILIRUBIN DIRECT: 0.6 mg/dL — ABNORMAL HIGH (ref 0.00–0.40)

## 2018-06-02 LAB — BLOOD GAS CRITICAL CARE PANEL, ARTERIAL
BASE EXCESS ARTERIAL: -4.5 — ABNORMAL LOW (ref -2.0–2.0)
CALCIUM IONIZED ARTERIAL (MG/DL): 4.64 mg/dL (ref 4.40–5.40)
GLUCOSE WHOLE BLOOD: 96 mg/dL
HCO3 ARTERIAL: 20 mmol/L — ABNORMAL LOW (ref 22–27)
HEMOGLOBIN BLOOD GAS: 8.9 g/dL — ABNORMAL LOW (ref 12.00–16.00)
LACTATE BLOOD ARTERIAL: 1.6 mmol/L — ABNORMAL HIGH (ref <=1.2)
O2 SATURATION ARTERIAL: 91.6 % — ABNORMAL LOW (ref 94.0–100.0)
PCO2 ARTERIAL: 37.4 mmHg (ref 35.0–45.0)
PH ARTERIAL: 7.35 (ref 7.35–7.45)
SODIUM WHOLE BLOOD: 140 mmol/L (ref 135–145)

## 2018-06-02 LAB — PCO2 ARTERIAL: Carbon dioxide:PPres:Pt:BldA:Qn:: 37.4

## 2018-06-02 LAB — BLOOD UA

## 2018-06-02 LAB — HEMOGLOBIN: Lab: 8.9 — ABNORMAL LOW

## 2018-06-02 LAB — PROTEIN TOTAL: Protein:MCnc:Pt:Ser/Plas:Qn:: 6.9

## 2018-06-02 LAB — PHOSPHORUS: Phosphate:MCnc:Pt:Ser/Plas:Qn:: 8.9 — ABNORMAL HIGH

## 2018-06-02 LAB — AST (SGOT): Aspartate aminotransferase:CCnc:Pt:Ser/Plas:Qn:: 13 — ABNORMAL LOW

## 2018-06-02 LAB — MAGNESIUM: Magnesium:MCnc:Pt:Ser/Plas:Qn:: 2.3 — ABNORMAL HIGH

## 2018-06-03 ENCOUNTER — Ambulatory Visit
Admit: 2018-06-03 | Discharge: 2018-06-28 | Disposition: A | Discharge status: Other Acute Inpatient Hospital | Payer: MEDICARE | Source: Other Acute Inpatient Hospital

## 2018-06-03 ENCOUNTER — Encounter
Admit: 2018-06-03 | Discharge: 2018-06-28 | Disposition: A | Discharge status: Other Acute Inpatient Hospital | Payer: MEDICARE | Source: Other Acute Inpatient Hospital | Attending: Student in an Organized Health Care Education/Training Program

## 2018-06-03 DIAGNOSIS — J9601 Acute respiratory failure with hypoxia: Principal | ICD-10-CM

## 2018-06-03 LAB — CBC
HEMATOCRIT: 26.8 % — ABNORMAL LOW (ref 36.0–46.0)
HEMATOCRIT: 26.7 % — ABNORMAL LOW (ref 36.0–46.0)
HEMATOCRIT: 23.9 % — ABNORMAL LOW (ref 36.0–46.0)
HEMATOCRIT: 26.7 % — ABNORMAL LOW (ref 36.0–46.0)
HEMOGLOBIN: 8.7 g/dL — ABNORMAL LOW (ref 12.0–16.0)
HEMOGLOBIN: 8.5 g/dL — ABNORMAL LOW (ref 12.0–16.0)
MEAN CORPUSCULAR HEMOGLOBIN CONC: 32.4 g/dL (ref 31.0–37.0)
MEAN CORPUSCULAR HEMOGLOBIN CONC: 32.5 g/dL (ref 31.0–37.0)
MEAN CORPUSCULAR HEMOGLOBIN CONC: 31.8 g/dL (ref 31.0–37.0)
MEAN CORPUSCULAR HEMOGLOBIN: 30.5 pg (ref 26.0–34.0)
MEAN CORPUSCULAR HEMOGLOBIN: 30.7 pg (ref 26.0–34.0)
MEAN CORPUSCULAR HEMOGLOBIN: 31.2 pg (ref 26.0–34.0)
MEAN CORPUSCULAR HEMOGLOBIN: 30.6 pg (ref 26.0–34.0)
MEAN CORPUSCULAR VOLUME: 94.2 fL (ref 80.0–100.0)
MEAN CORPUSCULAR VOLUME: 94.6 fL (ref 80.0–100.0)
MEAN CORPUSCULAR VOLUME: 96 fL (ref 80.0–100.0)
MEAN CORPUSCULAR VOLUME: 96 fL (ref 80.0–100.0)
MEAN PLATELET VOLUME: 10.2 fL — ABNORMAL HIGH (ref 7.0–10.0)
MEAN PLATELET VOLUME: 10.6 fL — ABNORMAL HIGH (ref 7.0–10.0)
MEAN PLATELET VOLUME: 9.3 fL (ref 7.0–10.0)
PLATELET COUNT: 167 10*9/L (ref 150–440)
PLATELET COUNT: 160 10*9/L (ref 150–440)
PLATELET COUNT: 139 10*9/L — ABNORMAL LOW (ref 150–440)
PLATELET COUNT: 155 10*9/L (ref 150–440)
RED BLOOD CELL COUNT: 2.84 10*12/L — ABNORMAL LOW (ref 4.00–5.20)
RED BLOOD CELL COUNT: 2.83 10*12/L — ABNORMAL LOW (ref 4.00–5.20)
RED BLOOD CELL COUNT: 2.49 10*12/L — ABNORMAL LOW (ref 4.00–5.20)
RED BLOOD CELL COUNT: 2.78 10*12/L — ABNORMAL LOW (ref 4.00–5.20)
RED CELL DISTRIBUTION WIDTH: 18.3 % — ABNORMAL HIGH (ref 12.0–15.0)
RED CELL DISTRIBUTION WIDTH: 18.3 % — ABNORMAL HIGH (ref 12.0–15.0)
RED CELL DISTRIBUTION WIDTH: 18.7 % — ABNORMAL HIGH (ref 12.0–15.0)
WBC ADJUSTED: 13.3 10*9/L — ABNORMAL HIGH (ref 4.5–11.0)
WBC ADJUSTED: 12.2 10*9/L — ABNORMAL HIGH (ref 4.5–11.0)
WBC ADJUSTED: 7.7 10*9/L (ref 4.5–11.0)
WBC ADJUSTED: 11 10*9/L (ref 4.5–11.0)

## 2018-06-03 LAB — BLOOD GAS CRITICAL CARE PANEL, ARTERIAL
BASE EXCESS ARTERIAL: -5.8 — ABNORMAL LOW (ref -2.0–2.0)
BASE EXCESS ARTERIAL: -6.9 — ABNORMAL LOW (ref -2.0–2.0)
BASE EXCESS ARTERIAL: -7.9 — ABNORMAL LOW (ref -2.0–2.0)
CALCIUM IONIZED ARTERIAL (MG/DL): 4.65 mg/dL (ref 4.40–5.40)
CALCIUM IONIZED ARTERIAL (MG/DL): 4.85 mg/dL (ref 4.40–5.40)
CALCIUM IONIZED ARTERIAL (MG/DL): 4.54 mg/dL (ref 4.40–5.40)
FIO2 ARTERIAL: 60
GLUCOSE WHOLE BLOOD: 99 mg/dL
GLUCOSE WHOLE BLOOD: 116 mg/dL
GLUCOSE WHOLE BLOOD: 106 mg/dL
HCO3 ARTERIAL: 19 mmol/L — ABNORMAL LOW (ref 22–27)
HCO3 ARTERIAL: 19 mmol/L — ABNORMAL LOW (ref 22–27)
HCO3 ARTERIAL: 18 mmol/L — ABNORMAL LOW (ref 22–27)
HEMOGLOBIN BLOOD GAS: 9.7 g/dL — ABNORMAL LOW (ref 12.00–16.00)
HEMOGLOBIN BLOOD GAS: 7.8 g/dL — ABNORMAL LOW (ref 12.00–16.00)
LACTATE BLOOD ARTERIAL: 1.3 mmol/L — ABNORMAL HIGH (ref <=1.2)
LACTATE BLOOD ARTERIAL: 0.9 mmol/L (ref <=1.2)
O2 SATURATION ARTERIAL: 94.9 % (ref 94.0–100.0)
O2 SATURATION ARTERIAL: 96.5 % (ref 94.0–100.0)
O2 SATURATION ARTERIAL: 98.4 % (ref 94.0–100.0)
PCO2 ARTERIAL: 38.4 mmHg (ref 35.0–45.0)
PCO2 ARTERIAL: 46.2 mmHg — ABNORMAL HIGH (ref 35.0–45.0)
PCO2 ARTERIAL: 41.9 mmHg (ref 35.0–45.0)
PH ARTERIAL: 7.32 — ABNORMAL LOW (ref 7.35–7.45)
PH ARTERIAL: 7.26 — ABNORMAL LOW (ref 7.35–7.45)
PO2 ARTERIAL: 77.6 mmHg — ABNORMAL LOW (ref 80.0–110.0)
POTASSIUM WHOLE BLOOD: 4.4 mmol/L (ref 3.4–4.6)
POTASSIUM WHOLE BLOOD: 4.4 mmol/L (ref 3.4–4.6)
POTASSIUM WHOLE BLOOD: 4 mmol/L (ref 3.4–4.6)
SODIUM WHOLE BLOOD: 138 mmol/L (ref 135–145)
SODIUM WHOLE BLOOD: 138 mmol/L (ref 135–145)
SODIUM WHOLE BLOOD: 138 mmol/L (ref 135–145)

## 2018-06-03 LAB — MEAN PLATELET VOLUME: Lab: 10.6 — ABNORMAL HIGH

## 2018-06-03 LAB — COMPREHENSIVE METABOLIC PANEL
ALBUMIN: 3.5 g/dL (ref 3.5–5.0)
ALKALINE PHOSPHATASE: 49 U/L (ref 38–126)
ALT (SGPT): 14 U/L — ABNORMAL LOW (ref 15–48)
ANION GAP: 19 mmol/L — ABNORMAL HIGH (ref 9–15)
AST (SGOT): 12 U/L — ABNORMAL LOW (ref 14–38)
BILIRUBIN TOTAL: 0.6 mg/dL (ref 0.0–1.2)
BLOOD UREA NITROGEN: 108 mg/dL — ABNORMAL HIGH (ref 7–21)
BUN / CREAT RATIO: 38
CALCIUM: 8.9 mg/dL (ref 8.5–10.2)
CHLORIDE: 101 mmol/L (ref 98–107)
CREATININE: 2.87 mg/dL — ABNORMAL HIGH (ref 0.60–1.00)
EGFR CKD-EPI AA FEMALE: 19 mL/min/{1.73_m2} — ABNORMAL LOW (ref >=60)
EGFR CKD-EPI NON-AA FEMALE: 17 mL/min/{1.73_m2} — ABNORMAL LOW (ref >=60)
GLUCOSE RANDOM: 97 mg/dL (ref 65–179)
POTASSIUM: 4.5 mmol/L (ref 3.5–5.0)
PROTEIN TOTAL: 6.7 g/dL (ref 6.5–8.3)
SODIUM: 139 mmol/L (ref 135–145)

## 2018-06-03 LAB — MEAN CORPUSCULAR HEMOGLOBIN: Lab: 31.2

## 2018-06-03 LAB — BASE EXCESS ARTERIAL: Base excess:SCnc:Pt:BldA:Qn:Calculated: -6.9 — ABNORMAL LOW

## 2018-06-03 LAB — LACTATE BLOOD ARTERIAL: Lactate:SCnc:Pt:BldA:Qn:: 1.3 — ABNORMAL HIGH

## 2018-06-03 LAB — MAGNESIUM: Magnesium:MCnc:Pt:Ser/Plas:Qn:: 2.1

## 2018-06-03 LAB — MEAN CORPUSCULAR HEMOGLOBIN CONC: Lab: 32.4

## 2018-06-03 LAB — HEMATOCRIT: Lab: 26.7 — ABNORMAL LOW

## 2018-06-03 LAB — CYCLOSPORINE (FPIA) BLOOD
Lab: 43
Lab: 49

## 2018-06-03 LAB — ANION GAP: Anion gap 3:SCnc:Pt:Ser/Plas:Qn:: 19 — ABNORMAL HIGH

## 2018-06-03 LAB — PH ARTERIAL: pH:LsCnc:Pt:BldA:Qn:: 7.26 — ABNORMAL LOW

## 2018-06-03 LAB — LIPASE: Triacylglycerol lipase:CCnc:Pt:Ser/Plas:Qn:: 252 — ABNORMAL HIGH

## 2018-06-03 LAB — PRO-BNP: Natriuretic peptide.B prohormone N-Terminal:MCnc:Pt:Ser/Plas:Qn:: 62500 — ABNORMAL HIGH

## 2018-06-03 LAB — PHOSPHORUS: Phosphate:MCnc:Pt:Ser/Plas:Qn:: 8.3 — ABNORMAL HIGH

## 2018-06-03 NOTE — Unmapped (Signed)
MICU Accept Note     Date of Service: 06/03/2018    Problem List:       Interval history: Katherine Norton is a 62 y.o. female with  PMHx of liver transplant (1999) on immunosuppresion, chronic back pain, anxiety, HTN, CKD,  that presents to [facility] as a transfer from [facility], with respiratory failure and possible contained duodenal perforation.      24hr events: adm to SICU yesterday from OSH.  No acute surgical needs therfore tx to MICU for continued care    Neurological   #acute on chronic pain/sedation  - will transition from fentanyl gtt to prn morphine  For RAS 0-1      #acute encephalopathy: presented to OSH altered, but also hypoxic at that time. Currently [facility]    Pulmonary   #acute hypoxic respiratory failure likely due to multifocal PNA.  Intubated on 8/1 CT chest with Scattered airspace opacities in the left apex, left upper lobe and superior segment of left lower lobe; concerning for pneumonia.  Currently on PRVC 12/300/60% +8 PEEP with most recent ABG 7.24/46/99/97%.  Will adjust minute ventilation for resp acidosis and wean Fi02.    -sedation as needed for vent compliance.      Cardiovascular   #h/o hypertension: hold home metoprolol and amlodipine as recently off pressor  - prn hydralazine    Echo 7/29: EF 65-70%, mod pulm HTN and borderline dilated RV  Renal   #Acute on chronic renal failure  - Cr baseline appears to be ~1.7--> 3-->2.87  - Was on diuresis regimen at OSH, but was net pos due to enteral feeds  - Trend and may require CRRT or diuresis regimen    Infectious Disease/Autoimmune   #septic shock due to ? PNA.  Initially seen at OSH and had c/f perforated duodenal ulcer, but CT scan here non concerning.  CT chest with possible multifocal PNA.    - ID consult pending    Abx:   Vanco (8/11-->)  Mica (8/11-->)  Flagyl (8/10-->)  Cefepime  (8/10-->)    Cx:  8/11: fungal cx: pending  8/11: LRC: pending  8/10: blood cx pending  8/10: BAL: pending    Abx: OSH  ceftaroline and flagyl levaquin  meropenem     Cx OSH:  OSH: Fungal, pneumocystis/histoplasma, AFB, Gram on BAL all were negative.  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  Per surgery, NO perf.      #abd pain: pt endorsing abd pain.  Will check lipase.  Hold on feeding.      Malnutrition Assessment: Not done yet.         Heme/Coag   Acute blood loss  - Hgb drop to 5.8 at OSH with transfusion  - Watch for signs of bleeding: s  - cbc q 6 hrs.    - DVT ppx held due to drop in h/h.  Restart if h/h stable overnight.      Endocrine   nai    Prophylaxis/LDA/Restraints/Consults   Can CVC be removed? No: need for medications requiring central access (e.g. pressors)   Can A-line be removed? No: frequent ABGs  Can Foley be removed? No: Need continuous I/O  Mobility plan: Step  1 - Range of motion    Feeding: NPO for procedure  Analgesia: Pain not adequately controlled, titrating medications  Sedation SAT/SBT: No PEEP > 8  Thrombembolic ppx: Mechanical only, chemical contraindicated secondary to active bleeding in last 48 hours  Head of bed: >30 deg  Ulcer ppx: On treatment PPI for GI bleed  Glucose within target range: Yes, in range    RASS at goal? No - adjusting sedation or order to reflect need  [place] Agitation Assessment Scale (RASS) : +1 (06/03/2018  2:00 PM)     Can antipsychotics be stopped? N/A, not on antipsychotics  CAM-ICU Result: Positive (06/03/2018  8:00 AM)      Patient Lines/Drains/Airways Status    Active Active Lines, Drains, & Airways     Name:   Placement date:   Placement time:   Site:   Days:    ETT  7.5   06/02/18    2123     less than 1    CVC Triple Lumen 06/02/18 Left Femoral   06/02/18    2119    Femoral   less than 1    CVC Triple Lumen 06/03/18 Non-tunneled Right Internal jugular   06/03/18    1503    Internal jugular   less than 1    NG/OG Tube Right nostril   06/02/18    2121    Right nostril   less than 1    Urethral Catheter   06/02/18    2118    ???   less than 1    Peripheral IV 06/02/18 Posterior;Left Forearm   06/02/18    2120    Forearm   less than 1    Arterial Line 06/03/18 Right Radial   06/03/18    0007    Radial   less than 1              Patient Lines/Drains/Airways Status    Active Wounds     None                Goals of Care     Code Status: Full Code    Designated Healthcare Decision Maker:  Katherine Norton currently lacks decisional capacity for healthcare decision-making and is unable to designate a surrogate healthcare decision maker. Katherine Norton designated healthcare decision maker(s) is/are Katherine Norton and Katherine Norton (the patient's adult child) as denoted by hospital policy for patients without a known preference.      Subjective   Katherine Norton is a 62 y.o. female with PMHx of liver transplant (1999) on immunosuppresion, chronic back pain, anxiety, HTN, CKD,  that presents to [facility] as a transfer from [facility]. Patient initially presented to the OSH on 05/20/18 due to acute worsening shortness of breath and altered mental status. Patient was sating 68% on RA, but 90% on CPAP. Patient had severe acidemia on ABG as well as acute on chronic renal failure with Cr of 3.89. CXR at that time showed consolidation with concern for pneumonia and was febrile. Patient's initial lactate was 2.70. Patient was started on IV ceftaroline and flagyl initially.  ??  Patient was intubated on 05/24/18 due to worsening respiratory status and needed vasopressors on/off for initial 24 hours. AKI continued to persist despite resuscitation, lasix gtt, and bolus pushes of lasix. Patient is estimated to be approximately 15L positive even with diuresis as most of input is from enteral feeds. Patient underwent bronch on 8/4 and 8/5 for significant mucus plugging.  Patient was switched to merrem by ID and d/c levaquin. Fungal, pneumocystis/histoplasma, AFB, Gram on BAL all were negative. Patient had a recent CT Abd/pelvis/chest done which showed a possible contained perforated duodenal ulcer  ??  Today patient became increasingly more tachypneic with RR in the 40s. Changes to sedation from precedex to propofol did not help with this. Given if patient would require surgery, the complicated nature of it, decided to transfer to a liver transplant facility (patient had transplant done here). In addition patient's hemoglobin reached a low of 5.8 requiring pRBC transfusion.    Objective     Vitals - past 24 hours  Temp:  [36.5 ??C (97.7 ??F)-36.6 ??C (97.9 ??F)] 36.6 ??C (97.9 ??F)  Heart Rate:  [45-88] 73  SpO2 Pulse:  [48-87] 74  Resp:  [12-47] 19  BP: (158-194)/(96-106) 191/101  A BP-1: (158)/(75) 158/75  FiO2 (%):  [40 %-100 %] 80 %  SpO2:  [92 %-100 %] 99 % Intake/Output  I/O last 3 completed shifts:  In: 1770.4 [I.V.:987.9; NG/GT:400; IV Piggyback:382.5]  Out: 1195 [Urine:995; Stool:200]     Physical Exam:    General: obese female, awake in NAD  HEENT: PERRLA  CV: RRR, no mrg  Pulm: coarse bs bilaterally  GI: soft, tender diffusely to palpation  MSK: no edema  Skin: intact  Neuro: awake, FC, mae      Continuous Infusions:   ??? dexmedetomidine Stopped (06/03/18 1205)   ??? fentaNYL citrate (PF) 75 mcg/hr (06/03/18 1540)   ??? lactated Ringers 100 mL/hr (06/03/18 1200)   ??? norepinephrine bitartrate-NS 2 mcg/min (06/03/18 1220)   ??? propofol Stopped (06/03/18 1327)       Scheduled Medications:   ??? calcitonin (salmon)  1 spray Alternating Nares Daily (RT)   ??? [START ON 06/04/2018] Cefepime  1 g Intravenous Q12H   ??? cycloSPORINE  50 mg NG tube BID   ??? metoprolol succinate  50 mg Oral Daily   ??? metronidazole  500 mg Intravenous [facility]   ??? micafungin  100 mg Intravenous Q24H [facility]   ??? pantoprazole  40 mg Intravenous BID       PRN medications:      Data/Imaging Review: Reviewed in Epic and personally interpreted on 06/03/2018. See EMR for detailed results.     .  Critical Care Attestation     This patient is critically ill or injured with the impairment of vital organ systems such that there is a high probability of imminent or life threatening deterioration in the patient's condition. This patient must remain in the ICU for ongoing evaluation of the comprehensive management plan outlined in this note. I directly provided critical care services as documented in this note and the critical care time spent (45 min) is exclusive of separately billable procedures.    Raeshawn Tafolla Paulino Door, PA

## 2018-06-03 NOTE — Unmapped (Signed)
Arterial Line Insertion Procedure Note    Patient Name:: Katherine Norton  Patient MRN: [id number]    Indications:  Need for continuous blood pressure monitoring and frequent blood gas analysis.    Procedure Details:   Informed consent was obtained after explanation of the risks and benefits of the procedure, refer to the consent documentation.    Time-out was performed immediately prior to the procedure.    The right radial artery was identified using bedside ultrasound.  Allen's test was performed to verify dual arterial blood supply.  This area was prepped and draped in the usual sterile fashion.  Lidocaine was used to anesthetize the surrounding skin area.  The artery was identified with real time ultrasound and a 20 g Arrow catheter was placed into the artery with pulsatile arterial blood return.  An arterial waveform was transduced and then the catheter was secured with suture and a transparent dressing was applied.       Condition:  The patient tolerated the procedure well and remains in the same condition as pre-procedure.    Complications:  None; patient tolerated the procedure well.    Time Completed: 10 min  Comments: N/A    Resident(s) Performing Procedure: Ruta Hinds  Resident Year: PGY3    Significant Labs:  INR   Date Value Ref Range Status   05/22/2015 1.1 0.8 - 1.2 Final     Comment:     Reference interval is for non-anticoagulated patients.  Suggested INR therapeutic range for Vitamin K  antagonist therapy:     Standard Dose (moderate intensity                    therapeutic range):       2.0 - 3.0     Higher intensity therapeutic range       2.5 - 3.5       Prothrombin Time   Date Value Ref Range Status   05/22/2015 11.2 9.1 - 12.0 sec Final     APTT   Date Value Ref Range Status   04/07/2009 30.7 24.7 - 35.3 SECONDS Final     Platelet   Date Value Ref Range Status   06/02/2018 145 (L) 150 - 440 10*9/L Final   04/05/2018 163 150 - 450 x10E3/uL Final

## 2018-06-03 NOTE — Unmapped (Signed)
CVAD Liaison - Insertion Note    The CVAD Liaison was contacted for the insertion of placing Central Venous Access Device (CVAD).  A chart review performed. SBARq received from primary nurse.    Indication:  Poor Vascular Access    Informed consent obtained from guardian.    Prior to the start of the procedure, a time out was performed and the identity of the patient was confirmed via name, medical record number and date of birth.  The sterile field was prepared with necessary supplies and equipment verified.  Insertion site was prepped with chlorhexidine solution and allowed to dry.  Maximum sterile techniques was utilized.    CVAD was inserted by Dr. Tiburcio Pea.  Catheter was aspirated and flushed.  Insertion site cleansed, and sterile dressing applied per manufacturer guidelines.  The Central Line Checklist was referenced.    CVAD Liaison was present during entire procedure.  Report of the procedure given to the Primary Nurse.    CVAD Liaison provided written education.    Thank you for this consult,  Raymon Mutton RN    Consult Time 75 minutes (min)

## 2018-06-03 NOTE — Unmapped (Signed)
Consult note will serve as H&P.

## 2018-06-03 NOTE — Unmapped (Signed)
Pt admitted from OSH to SICU.  Propofol at 50 mcg/kg/min on arrival. Restarted per orders at 30 mcg and titrated per RASS.  Pt nods yes/no, hand grasp right is weak.  Flicker only of left hand.  Wiggles bilateral toes.  SBP initially 180's-190 decreased to 160 with sedation optimization.  Contrast given and awaiting Ct abd. Urine culture, BAL, and blood cultures drawn.  Afebrile.  Noted bilateral pannus breakdown from MASD.    Problem: Adult Inpatient Plan of Care  Goal: Plan of Care Review  Outcome: Progressing  Goal: Patient-Specific Goal (Individualization)  Outcome: Progressing  Goal: Absence of Hospital-Acquired Illness or Injury  Outcome: Progressing  Goal: Optimal Comfort and Wellbeing  Outcome: Progressing  Goal: Readiness for Transition of Care  Outcome: Progressing  Goal: Rounds/Family Conference  Outcome: Progressing     Problem: Latex Allergy  Goal: Absence of Allergy Symptoms  Outcome: Progressing     Problem: Skin Injury Risk Increased  Goal: Skin Health and Integrity  Outcome: Progressing     Problem: Fall Injury Risk  Goal: Absence of Fall and Fall-Related Injury  Outcome: Progressing     Problem: Communication Impairment (Mechanical Ventilation, Invasive)  Goal: Effective Communication  Outcome: Progressing     Problem: Device-Related Complication Risk (Mechanical Ventilation, Invasive)  Goal: Optimal Device Function  Outcome: Progressing     Problem: Inability to Wean (Mechanical Ventilation, Invasive)  Goal: Mechanical Ventilation Liberation  Outcome: Progressing     Problem: Nutrition Impairment (Mechanical Ventilation, Invasive)  Goal: Optimal Nutrition Delivery  Outcome: Progressing     Problem: Skin and Tissue Injury (Mechanical Ventilation, Invasive)  Goal: Absence of Device-Related Skin and Tissue Injury  Outcome: Progressing     Problem: Ventilator-Induced Lung Injury (Mechanical Ventilation, Invasive)  Goal: Absence of Ventilator-Induced Lung Injury  Outcome: Progressing     Problem: Non-Violent Restraints  Goal: Patient will remain free of restraint events  Outcome: Progressing  Goal: Patient will remain free of physical injury  Outcome: Progressing     Problem: Self-Care Deficit  Goal: Improved Ability to Complete Activities of Daily Living  Outcome: Progressing

## 2018-06-03 NOTE — Unmapped (Signed)
SURGICAL ICU PROCEDURE NOTE     DATE OF SERVICE: 06/03/2018     PROCEDURE:  BRONCHOSCOPY    INDICATION:  Pneumonia     CONSENT:   yes    TIME OUT:   correct patient, side, site, procedure, position, equipment Yes    POSITION:                 supine      SITE:                          N/A    ULTRASOUND:        N/A               ANESTHESIA:  Propofol Infusion at 80 mcg/kg/min    DESCRIPTION: BRONCHOSCOPY  Topical nasopharyngeal anesthesia was applied,   The patient was positioned appropriately and the bronchoscope was passed through the endotracheal tube. The scope was then passed into the trachea. Careful inspection of the tracheal lumen was accomplished.  The scope was sequentially passed into the left main and then left upper and lower bronchi and segmental bronchi.  The scope was then withdrawn and advanced into the right main bronchus and then into the RUL, RML, and RLL bronchi and segmental bronchi.  Therapeutic aspiration was performed to remove mucous plugs/secretions from R and L lower lobes.  The bronchial lumens were patent after the intervention     FINDINGS:   Secretions/plugging L lower lobe    SPECIMENS SENT:  Cell count with differential, Gram Stain, Anerobe culture and Aerobe culture    COMPLICATIONS:  no complications were noted    BLOOD LOSS:  Minimal    POST-PROCEDURE DIAGNOSIS:  Same

## 2018-06-03 NOTE — Unmapped (Signed)
All sedations turned off early this afternoon secondary to hypotension. Norepinephrine was initiated for a short period and weaned off for MAP>60. Remain orally intubated and PRVC ventilation. Bronched today and sample was sent to lab. Pain controlled on fentanyl gtt. Repositioned Q2hr. Repositioned Q2hr. Interdry placed to skin folds. Family at bedside, update provided by MD and RN.  Problem: Adult Inpatient Plan of Care  Goal: Plan of Care Review  Outcome: Progressing  Goal: Patient-Specific Goal (Individualization)  Outcome: Progressing  Goal: Absence of Hospital-Acquired Illness or Injury  Outcome: Progressing  Goal: Optimal Comfort and Wellbeing  Outcome: Progressing  Goal: Readiness for Transition of Care  Outcome: Progressing  Goal: Rounds/Family Conference  Outcome: Progressing     Problem: Latex Allergy  Goal: Absence of Allergy Symptoms  Outcome: Progressing     Problem: Skin Injury Risk Increased  Goal: Skin Health and Integrity  Outcome: Progressing     Problem: Fall Injury Risk  Goal: Absence of Fall and Fall-Related Injury  Outcome: Progressing     Problem: Communication Impairment (Mechanical Ventilation, Invasive)  Goal: Effective Communication  Outcome: Progressing     Problem: Device-Related Complication Risk (Mechanical Ventilation, Invasive)  Goal: Optimal Device Function  Outcome: Progressing     Problem: Inability to Wean (Mechanical Ventilation, Invasive)  Goal: Mechanical Ventilation Liberation  Outcome: Progressing     Problem: Nutrition Impairment (Mechanical Ventilation, Invasive)  Goal: Optimal Nutrition Delivery  Outcome: Progressing     Problem: Skin and Tissue Injury (Mechanical Ventilation, Invasive)  Goal: Absence of Device-Related Skin and Tissue Injury  Outcome: Progressing     Problem: Ventilator-Induced Lung Injury (Mechanical Ventilation, Invasive)  Goal: Absence of Ventilator-Induced Lung Injury  Outcome: Progressing     Problem: Self-Care Deficit  Goal: Improved Ability to Complete Activities of Daily Living  Outcome: Progressing     Problem: Hypertension Comorbidity  Goal: Blood Pressure in Desired Range  Outcome: Progressing     Problem: Pain Chronic (Persistent) (Comorbidity Management)  Goal: Acceptable Pain Control and Functional Ability  Outcome: Progressing

## 2018-06-03 NOTE — Unmapped (Signed)
Problem: Communication Impairment (Mechanical Ventilation, Invasive)  Goal: Effective Communication  Note:   Patient came from outside hospital and was placed on full support ventilation. Is currently stable. Will continue to monitor.

## 2018-06-03 NOTE — Unmapped (Signed)
NEW IMMUNOCOMPROMISED HOST INFECTIOUS DISEASE CONSULT NOTE      Katherine Norton is being seen in consultation at the request of Merri Ray, MD for evaluation of pneumonia in the immunocompromised host.    Assessment/Recommendations:    Katherine Norton is a 62 y.o. female    ID Problem List:  #Hx of cryptogenic cirrhosis s/p open liver transplant (1996)  -Serologies: unknown  -Immunosuppression: cyclosporine BID (goal ~50-100)    #Co-morbiditis: acute on CKD, chronic back pain    #Acute hypoxic respiratory failure, concern for pneumonia - 06/02/18  - 7/28 OSH abnormal chest imaging - diffuse patchy opacities in R lung; pulm edema, R lobar pneumonia, CAP vs. Aspiration  - 7/29 Strep pneumo Ag neg, RVP neg, urine Legionella Ag neg  - 7/29 bd cx NGTD  - 8/1 intubated for worsening respiratory status  - 8/3 urine Histo Ag neg, fungal ab neg (aspergillus, blasto and histo Ab)  - 8/4 BAL WBC 911, RBC 111, cloudy; resp quant cx <1k, Gram stain no orgs seen, DFA pneumocystis neg. No cytologic e/o malignancy.  - 8/10 transferred from OSH [facility]) to [facility]  - 06/03/18 bd cx sent  - 06/03/18 BAL  ---Gram stain: 3+ PMN, 1+ yeast  ---Cx pending  - 06/03/18 CT chest w/o contrast: scattered airspace opacities in L apex, LUL and superior segment of LLL, concerning for pneumonia  - 06/03/18 CT A/P w/o contrast: prior liver transplant, diffuse mesenteric and body wall edema with no loculated fluid collections  - 06/03/18 hypotension s/p bronchoscopy, thought to be related to sedation    Recommendations:  Dx:  - Please send for serum cryptococcus Ag  - Check CMV, EBV PCR in blood  - Check serum Aspergillus galactomannan  - f/u BAL cx, fungal cx (requested DFA pneumocystis, AFB Cx in addition)  - f/u bd cx  - f/u fungitell assay  - f/u OSH cx    Tx:  -Continue cefepime and flagyl for empiric coverage  -Start vancomycin IV for empiric GP coverage (need to be dosed by level given renal dysfunction). If previously given vancomycin at OSH, please check the level.    The [facility] ID service will continue to follow.  Please page the ID Transplant/Liquid Oncology Fellow consult at [phone] with questions.  Patient discussed with Dr. Valarie Cones.    Jarome Matin, MD  Fellow, [facility] Division of Infectious Diseases       History of Present Illness:      Source of information includes:  Electronic Medical Records     Katherine Norton is a 62 year old female with history of liver transplant in 1999 on immunosuppression who was transferred from OSH to here for management of respiratory failure and altered mental status. [facility] was consulted for concern for septic shock with pneumonia.    From [facility], patient initially was admitted to OSH on 05/20/18 for acute hypoxic respiratory failure and was intubed on 05/24/18 for worsening respiratory status. She was also in shock during the hospital course requiring pressors but at the time of transfer to [facility], she has been off of pressors for more than 24 hours. Her OSH course was notable for anemia with no obvious bleeding, which responded appropriately with pRBC transfusion but noted to have ongoing tachypnea that did not improve with transfusion. CT C/A/P without contrast was done which was concering for a perforated duodenal ulcer, and general surgery team at OSH recommended transferring the patient to [facility] given history of liver transplant.  She was initially admitted to SICU on 06/02/18 for concern of a perforated duodenal ulcer, which ultimately was ruled out.  CT chest here 06/03/18 revealed findings concerning for pneumonia.      Allergies:  Allergies   Allergen Reactions   ??? Iodinated Contrast Media    ??? Ioxaglate Sodium    ??? Latex        Medications:   Current antibiotics:  Cefepime 1g q12h IV   Metronidazole 500mg  q8h IV  Micafungin 100mg  q24h IV    Previous antibiotics:    Current/Prior immunomodulators:  Cyclosporine 50mg  q12h NG    Other medications reviewed.     Medical History:  Past Medical History:   Diagnosis Date   ??? Chronic kidney disease    ??? Cirrhosis (CMS-HCC)    ??? HTN (hypertension)        Surgical History:  Past Surgical History:   Procedure Laterality Date   ??? BRONCHOSCOPY  06/03/2018        ??? HYSTERECTOMY     ??? 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 TONSILLECTOMY         Social History:  Tobacco use:   reports that she has never smoked. She has never used smokeless tobacco.   Alcohol use:    reports that she does not drink alcohol.   Drug use:    reports that she does not use drugs.   Living situation:     Residence:   [place], [place]   Birth place     [name] travel:      International travel:      Hotel manager service:     Employment:     Pets and animal exposure:     Insect exposure:     Hobbies:     TB exposures:     Sexual history:     Other significant exposures:     *Unable to obtain social history as patient is intubated/sedated. No family at bedside at the time of exam.      Family History:  Family History   Problem Relation Age of Onset   ??? Hypertension Mother    ??? Hypertension Father    ??? Prostate cancer Father    ??? Kidney disease Neg Hx    ??? Lupus Sister    ??? Lupus Brother        Review of Systems:  Limited due to patient factors         Vital Signs last 24 hours:  Temp:  [36.5 ??C-36.6 ??C] 36.6 ??C  Core Temp:  [36.5 ??C-36.6 ??C] 36.6 ??C  Heart Rate:  [45-88] 73  SpO2 Pulse:  [48-87] 74  Resp:  [12-47] 19  BP: (158-194)/(96-106) 191/101  MAP (mmHg):  [122-133] 131  A BP-1: (158)/(75) 158/75  MAP:  [102 mmHg] 102 mmHg  A BP-2: (81-160)/(46-76) 119/62  MAP:  [58 mmHg-103 mmHg] 82 mmHg  FiO2 (%):  [40 %-100 %] 80 %  SpO2:  [92 %-100 %] 99 %    Physical Exam:  Patient Lines/Drains/Airways Status    Active Active Lines, Drains, & Airways     Name:   Placement date:   Placement time:   Site:   Days:    ETT  7.5   06/02/18    2123     less than 1    CVC Triple Lumen 06/02/18 Left Femoral   06/02/18    2119    Femoral   less  than 1    CVC Triple Lumen 06/03/18 Non-tunneled Right Internal jugular   06/03/18 1503    Internal jugular   less than 1    NG/OG Tube Right nostril   06/02/18    2121    Right nostril   less than 1    Urethral Catheter   06/02/18    2118    ???   less than 1    Peripheral IV 06/02/18 Posterior;Left Forearm   06/02/18    2120    Forearm   less than 1    Arterial Line 06/03/18 Right Radial   06/03/18    0007    Radial   less than 1              GEN:  intubated and sedated, ETT in place; critically ill  EYES: eyes closed; grimaces with touch  ENT:ETT in place  LYMPH:no cervical LAD; RIJ central line in place  CV:RRR and no abnormal heart sounds noted  PULM:on vent, coarse breath sounds  ZOX:WRUEA and soft, NT  VW:UJWJX catheter in place  RECTAL:deferred  SKIN:no petechiae, ecchymoses or obvious rashes on clothed exam  BJY:NWGNFAO  NEURO:intubated and sedated    Imaging:  Imaging was reviewed in Epic. Pertinent findings include as below.  and Imaging was personally reviewed  CT Chest W/O Contrast 06/03/18  Impression       Multifocal consolidative opacities concerning for pneumonia.    Bilateral pleural effusions and associated compressive atelectasis.    ADDENDUM:    Scattered airspace opacities in the left apex, left upper lobe and superior segment of left lower lobe; concerning for pneumonia.    Mild cardiomegaly. Dilated main pulmonary artery, can be seen in the clinical setting of pulmonary hypertension.     CT A/P W/O Contrast 06/03/18  --Sequelae of prior liver transplant. There is diffuse mesenteric and body wall edema with no loculated fluid collections identified.      Microbiology:  Microbiology was reviewed in Epic. Pertinent findings include as above in the ID problem list.     Labs:    Lab Results   Component Value Date    WBC 7.7 06/03/2018    WBC 12.2 (H) 06/03/2018    WBC 3.3 (L) 04/05/2018    WBC 4.2 03/09/2018    HGB 7.8 (L) 06/03/2018    HGB 10.3 (L) 04/05/2018    HCT 23.9 (L) 06/03/2018    HCT 30.8 (L) 04/05/2018    PLT 139 (L) 06/03/2018    PLT 163 04/05/2018    NEUTROABS 1.9 04/05/2018    LYMPHSABS 0.9 04/05/2018    EOSABS 0.1 04/05/2018    NA 139 06/03/2018    NA 138 06/03/2018    NA 140 04/05/2018    K 4.5 06/03/2018    K 4.4 06/03/2018    K 5.1 04/05/2018    BUN 108 (H) 06/03/2018    BUN 30 (H) 04/05/2018    CREATININE 2.87 (H) 06/03/2018    CREATININE 2.89 (H) 06/02/2018    CREATININE 2.32 (H) 04/05/2018    CREATININE 2.23 (H) 03/09/2018    GLU 97 06/03/2018    GLU 115 (H) 04/05/2018    MG 2.1 06/03/2018    MG 2.0 04/05/2018    ALBUMIN 3.5 06/03/2018    ALBUMIN 3.9 12/04/2014    BILITOT 0.6 06/03/2018    BILITOT 0.4 04/05/2018    AST 12 (L) 06/03/2018    AST 14 04/05/2018    ALT 14 (L) 06/03/2018  ALT 6 04/05/2018    ALKPHOS 49 06/03/2018    ALKPHOS 90 04/05/2018    INR 1.1 05/22/2015    ESR 45 (H) 01/17/2013    CRP <0.5 01/17/2013       Serologies:  Lab Results   Component Value Date    HIVAGAB Nonreactive 12/07/2015    HIVAGAB Nonreactive 10/09/2014    HEPBSAG Negative 10/09/2014    HEPBSAB Nonreactive 12/07/2015    HEPBSAB Nonreactive 10/09/2014    HEPBCAB Nonreactive 12/07/2015    HEPBCAB Negative 10/09/2014    HEPCAB Nonreactive 12/07/2015    HEPCAB Negative 10/09/2014       Immunizations:  Immunization History   Administered Date(s) Administered   ??? Influenza Virus Vaccine, unspecified formulation 07/06/2017   ??? Pneumococcal Conjugate 13-Valent 07/06/2017   ??? SHINGRIX-ZOSTER VACCINE (HZV), RECOMBINANT,SUB-UNIT,ADJUVANTED IM 01/15/2018

## 2018-06-03 NOTE — Unmapped (Signed)
Cyclosporine Therapeutic Monitoring Pharmacy Note    Katherine Norton is a 62 y.o. female continuing cyclosporine NEORAL.     Indication: Liver transplant     Date of Transplant: 05/15/1995      Prior Dosing Information: Home regimen 50 mg twice daily     Goals:  Therapeutic Drug Levels  Cyclosporine trough goal: 50-100 ng/mL per 12/2017 note    Additional Clinical Monitoring/Outcomes  ? Monitor renal function (SCr and urine output) and liver function (LFTs)  ? Monitor for signs/symptoms of adverse events (e.g., hyperglycemia, hyperkalemia, hypomagnesemia, hypertension, headache, tremor)    Results:   Cyclosporine level: Not applicable    Pharmacokinetic Considerations and Significant Drug Interactions:  ? Concurrent hepatotoxic medications: None identified  ? Concurrent CYP3A4 substrates/inhibitors: None identified  ? Concurrent nephrotoxic medications: None identified    Assessment/Plan:  Recommendation(s)  ? Continue current regimen of 50 mg twice daily    Follow-up  ? Next level has been ordered on 06/03/2018 at 0700.   ? A pharmacist will continue to monitor and recommend levels as appropriate    Please page service pharmacist with questions/clarifications.    Meyer Cory, PharmD   PGY1 Pharmacy Resident

## 2018-06-03 NOTE — Unmapped (Signed)
TEACHING ATTENDING: Pershing Proud  PERFORMING SERVICE: SICU  CONTACT [id number]    PROCEDURE PERFORMED: right internal jugular vein Triple Lumen insertion    INDICATIONS: Medications requiring central access and Hypovolemia  TIMEOUT: performed immediately prior  CONSENT: n/a This procedure has been fully reviewed with the patient/patient's next of kin, and written informed consent has been obtained.    IJ:  The right internal jugular vein was identified. This area was prepped and draped in the usual sterile fashion. Patient was placed inTrendelenburg position. Local anesthesia with 1 percent lidocaine was applied subcutaneously then deep to the skin. Patient was sedated using 40 mg Propofol push and concurrent 50 mg fentanyl gtt patient was already receiving. Bedside ultrasound was used for this procedure.    The finder or introducer needle was then inserted, remaining lateral to the carotid artery, until the internal jugular vein was punctured.   Using the Seldinger Technique a Triple Lumen was placed with each port easily flushed and freely drawing venous blood.   The Central Line Checklist was referenced.     PCXR Pending    I was present for the entirety of the procedure(s). Newton Pigg, MD

## 2018-06-03 NOTE — Unmapped (Signed)
Surgical ICU Admission Note    Consulting attending:   Consulting service: Trauma/Critical Care  Contact pager: [phone]     Requesting attending: Merri Ray, MD  Requesting service:     Assessment: Katherine Norton is a 62 y.o. female with PMHx of liver transplant (1999) on immunosuppresion, chronic back pain, anxiety, HTN, CKD,  that presents to [facility] as a transfer from [facility]. Patient with respiratory failure along with altered mental status.    Plan: Transfer to the SICU under Trauma Surgery/Critical Care Service for critical care management.    Neuro:   *Pain/Sedation  - Fentanyl gtt  - Propofol gtt  *AMS  - Unclear source but could be hepatic encephalopathy, sepsis, uremia  - Ammonia, labs    CV:   *Hx of HTN  - Takes metoprolol at home, will hold given recent septic shock and trend vitals    Pulm:  *Acute respiratory failure  - CT Chest to evaluate for any consolidation/effusion  - Wean as able, SBT  - Pulm toilet    FEN/GI:  F: LR at 100 ml/hr  E: replete electrolytes PRN  N: NPO  *Contained Duodenal Perf  - Protonix gtt  - CT abd/pelvis  *Liver Transplant  - Consult transplant team  - Consult Hepatology  - Check hepatic panel and ammonia  - Cyclosporine 50 mg BID    Renal/GU:   *Acute on chronic renal failure  - Cr baseline appears to be ~1.7  - Was on diuresis regimen at OSH, but was net pos due to enteral feeds  - Trend and may require CRRT or diuresis regimen    Heme:  *Acute blood loss  - Hgb drop to 5.8 at OSH with transfusion  - Watch for signs of bleeding    ID:  *Immunosuppression  - Cyclosporine 50 mg BID  - Blood cx, urine cx, mini BAL, fungal  - Empiric antibiotics?    Endo:    MSK:  *Chronic back pain  - Takes oxycodone q6h at home  - Fentanyl gtt    Wound:  N/A    Lines, tubes, drains:   ETT, NGT, Foley, PIV, Fem CVL    Daily Care Checklish:            Stress Ulcer Prevention:Yes, Occult gastrointestinal bleeding           DVT Prophylaxis: No           HOB > 30 degrees: yes Daily Awakening:  Yes           Spontaneous Breathing Trial: yes           Continued Beta Blockade:  no           Continued need for central/PICC line : yes  critically ill requiring fluid resuscitation           Continue urinary catheter for: yes  agressive diuresis/hydration    Dispo: Admit to SICU.     __________________________________________________  PCP: Soledad Gerlach, MD     History of Present Illness:     Katherine Norton is a 63 y.o. female with PMHx of liver transplant (1999) on immunosuppresion, chronic back pain, anxiety, HTN, CKD,  that presents to [facility] as a transfer from [facility]. Patient initially presented to the OSH on 05/20/18 due to acute worsening shortness of breath and altered mental status. Patient was sating 68% on RA, but 90% on CPAP. Patient had severe acidemia on ABG as well as  acute on chronic renal failure with Cr of 3.89. CXR at that time showed consolidation with concern for pneumonia and was febrile. Patient's initial lactate was 2.70. Patient was started on IV ceftaroline and flagyl initially.    Patient was intubated on 05/24/18 due to worsening respiratory status and needed vasopressors on/off for initial 24 hours. AKI continued to persist despite resuscitation, lasix gtt, and bolus pushes of lasix. Patient is estimated to be approximately 15L positive even with diuresis as most of input is from enteral feeds. Patient underwent bronch on 8/4 and 8/5 for significant mucus plugging. Patient was switched to merrem by ID and d/c levaquin. Fungal, pneumocystis/histoplasma, AFB, Gram on BAL all were negative. Patient had a recent CT Abd/pelvis/chest done which showed a possible contained perforated duodenal ulcer    Today patient became increasingly more tachypneic with RR in the 40s. Changes to sedation from precedex to propofol did not help with this. Given if patient would require surgery, the complicated nature of it, decided to transfer to a liver transplant facility (patient had transplant done here). In addition patient's hemoglobin reached a low of 5.8 requiring pRBC transfusion.    Allergies:  Iodinated contrast media; Ioxaglate sodium; and Latex    Home Medications:   Medications Prior to Admission   Medication Sig Dispense Refill Last Dose   ??? albuterol (PROVENTIL HFA;VENTOLIN HFA) 90 mcg/actuation inhaler Inhale 1-2 puffs every six (6) hours as needed for wheezing. 1 Inhaler 0 01/14/2018 at Unknown time   ??? alendronate (FOSAMAX) 70 MG tablet Take 70 mg by mouth once a week. Frequency:1XW   Dosage:70   MG  Instructions:  Note:Dose: 70MG    01/14/2018 at Unknown time   ??? allopurinol (ZYLOPRIM) 100 MG tablet Take 100 mg by mouth.   01/14/2018 at Unknown time   ??? amLODIPine (NORVASC) 5 MG tablet Take 5 mg by mouth daily.  2 01/14/2018 at Unknown time   ??? calcitonin, salmon, (MIACALCIN) 200 unit/actuation nasal spray 1 spray by Alternating Nares route once daily.    01/14/2018 at Unknown time   ??? calcium carbonate-vitamin D3 500-100 mg-unit Chew Frequency:   Dosage:0     Instructions:  Note:2 daily   01/14/2018 at Unknown time   ??? cholecalciferol, vitamin D3, (VITAMIN D3) 1,000 unit capsule Take 1,000 Units by mouth daily. Frequency:   Dosage:0   UNIT  Instructions:  Note:1 po daily   01/14/2018 at Unknown time   ??? citalopram (CELEXA) 10 MG tablet Take 10 mg by mouth once daily. Frequency:QD   Dosage:20   MG  Instructions:  Note:Dose: 20MG    01/14/2018 at Unknown time   ??? darbepoetin alfa-polysorbate (ARANESP, IN POLYSORBATE,) 200 mcg/0.4 mL Syrg Note:Once a month Dose: 200MCG/0.4   Taking   ??? ergocalciferol (VITAMIN D2) 50,000 unit capsule Take 50,000 Units by mouth once a week.    01/14/2018 at Unknown time   ??? fluticasone (FLONASE) 50 mcg/actuation nasal spray daily as needed.    01/14/2018 at Unknown time   ??? gabapentin (NEURONTIN) 300 MG capsule Take 300 mg by mouth daily as needed.   1 01/14/2018 at Unknown time   ??? hydrochlorothiazide (HYDRODIURIL) 12.5 MG tablet Take 12.5 mg by mouth daily. Frequency:QD   Dosage:12.5   MG  Instructions:  Note:Dose: 12.5 MG   01/14/2018 at Unknown time   ??? MAGNESIUM, AMINO ACID CHELATE, 133 mg tablet TAKE 1 TABLET BY MOUTH TWICE DAILY 60 each 3    ??? metoprolol succinate (TOPROL-XL) 50 MG 24 hr tablet  Take 50 mg by mouth daily.    01/14/2018 at Unknown time   ??? multivitamin with minerals tablet Take 1 tablet by mouth daily.    01/14/2018 at Unknown time   ??? NEORAL 25 mg capsule TAKE 2 CAPSULES (50MG ) BY MOUTH TWICE DAILY 120 each 8    ??? ondansetron (ZOFRAN-ODT) 4 MG disintegrating tablet Take 4 mg by mouth.   01/14/2018 at Unknown time   ??? oxyCODONE (ROXICODONE) 15 MG immediate release tablet Take 1 tablet (15 mg total) by mouth 4 (four) times a day as needed. (Patient taking differently: Take 10 mg by mouth 4 (four) times a day as needed. ) 120 tablet 0 01/14/2018 at Unknown time   ??? promethazine (PHENERGAN) 12.5 MG tablet Take 12.5 mg by mouth every six (6) hours as needed.   6 Unknown at Unknown time   ??? VOLTAREN 1 % gel Note:Apply 4 grams to painful knees, up to 4 times daily, as needed 300 g 3 01/08/2018   ??? zolpidem (AMBIEN) 5 MG tablet Take 5 mg by mouth nightly as needed.    01/14/2018 at Unknown time       Medical History:  Past Medical History:   Diagnosis Date   ??? Chronic kidney disease    ??? Cirrhosis (CMS-HCC)    ??? HTN (hypertension)        Surgical History:  Past Surgical History:   Procedure Laterality Date   ??? HYSTERECTOMY     ??? 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 TONSILLECTOMY         Social History:  Tobacco use:   reports that she has never smoked. She has never used smokeless tobacco.  Alcohol use:   reports that she does not drink alcohol.  Drug use:  reports that she does not use drugs.    Family History:  The patient's family history includes Hypertension in her father and mother; Lupus in her brother and sister; Prostate cancer in her father..     Review of Systems:  Review of systems was unobtainable due to patient factors. Objective  Vitals Reviewed:    Temp:  [36.6 ??C] 36.6 ??C  Heart Rate:  [70-78] 78  Resp:  [12-17] 17  BP: (158)/(106) 158/106  MAP (mmHg):  [123] 123  FiO2 (%):  [40 %] 40 %  SpO2:  [92 %-93 %] 93 %   Temp (24hrs), Avg:36.6 ??C, Min:36.6 ??C, Max:36.6 ??C     SpO2: 93 %   Height: 152.4 cm (5')         Body mass index is 31.52 kg/m??.    Body surface area is 1.76 meters squared.   No intake or output data in the 24 hours ending 06/02/18 2128     No intake/output data recorded.   No intake/output data recorded.    Continuous Infusions:   ??? lactated Ringers         Vent settings for last 24 hours:  Vent Mode: PRVC  FiO2 (%): 40 %  S RR: 12  S VT: 400 mL  PEEP: 8 cm H20    Hemodynamic parameters for last 24 hours:       Tubes and Drains:  Urethral Catheter (Active)   Number of days: 0     CVC Triple Lumen 06/02/18 Left Femoral (Active)   Site Assessment Clean;Dry;Intact 06/02/2018  9:21 PM   Proximal Lumen Status No blood return 06/02/2018  9:21 PM   Proximal Lumen Flush  Status Flushed 06/02/2018  9:21 PM   Medial Lumen Status No blood return 06/02/2018  9:21 PM   Medial 1 Lumen Flush Status Flushed 06/02/2018  9:21 PM   Distal Lumen Status Blood return noted 06/02/2018  9:21 PM   Distal Lumen Flush Status Flushed 06/02/2018  9:21 PM   IV Tubing / Clave Change Due 06/06/18 06/02/2018  9:21 PM   Dressing Type Transparent;Occlusive;Antimicrobial dressing 06/02/2018  9:21 PM   Dressing Status      Clean;Dry;Intact/not removed 06/02/2018  9:21 PM   Dressing Change Due 06/05/18 06/02/2018  9:21 PM   Line Necessity Reviewed? Y 06/02/2018  9:21 PM   Line Necessity Indications Yes - Medications requiring central line access (Consult Pharmacy PRN) 06/02/2018  9:21 PM   Line Necessity Reviewed With SICU Team 06/02/2018  9:21 PM   Number of days: 0        ETT  7.5 (Active)   Secured at 23 cm 06/02/2018  9:23 PM   Measured from Lips 06/02/2018  9:23 PM   Secured Location Right 06/02/2018  9:23 PM   Secured by [employer] tube holder 06/02/2018  9:23 PM Site Condition Cool;Dry 06/02/2018  9:23 PM   Placement Assessment Equal Bilateral Breath Sounds 06/02/2018  9:23 PM   Number of days: 0     Physical Exam:    Gen: Intubated and sedated.  HEENT: sclera anicteric. PERRLA. Dry mucous membranes. OP clear without lesions  CV: regular rate and rhythm, no murmurs  Resp: Distant BS, crackles heard bilaterally.  Abd: soft, non-tender, non-distended, active bowel sounds  Ext: warm, well-perfused. No edema. Distal pulses are palpable.  Skin: no rashes or lesions  Neuro: Moves and grimaces to painful stimuli. No purposeful movements. Doesn't track with eyes.    Labs/Studies: Reviewed in EPIC.

## 2018-06-03 NOTE — Unmapped (Signed)
ADULT SPECIALTY CARE TEAM  Transport Summary Note     Departing Unit: SICU Departure Time: 0300   Unit Returned To: SICU Return Time: 0345           Report received from primary nurse via SBARq. Patient prepared to transport to CT Scan via stretcher under ICU Transport Protocol. Vital signs during transport, see vital signs section of DocFlowsheet for further details. Patient is somnolent, sedated on Propofol & Fentanyl see MAR. Intubated on  ventilator managed  per RT . Patient tolerated procedure well. Standard precautions maintained throughout transport.     Returned to SICU, update and care given to primary nurse. See Doc Flowsheet for additional transport documentation.

## 2018-06-03 NOTE — Unmapped (Signed)
Vancomycin Therapeutic Monitoring Pharmacy Note    Faigy Stretch is a 62 y.o. female starting vancomycin. Date of therapy initiation: 06/03/18    Indication: Suspected infection     Prior Dosing Information: None/new initiation     Goals:  Therapeutic Drug Levels  Vancomycin trough goal: 15-20 mg/L    Additional Clinical Monitoring/Outcomes  Renal function, volume status (intake and output)    Results: Not applicable    Wt Readings from Last 1 Encounters:   06/02/18 92.5 kg (203 lb 14.8 oz)     Creatinine   Date Value Ref Range Status   06/02/2018 2.89 (H) 0.60 - 1.00 mg/dL Final   16/07/9603 5.40 (H) 0.57 - 1.00 mg/dL Final   98/08/9146 8.29 (H) 0.57 - 1.00 mg/dL Final        Pharmacokinetic Considerations and Significant Drug Interactions:  ? Adult (estimated initial): Vd = 65.675 L, ke = 0.0232 hr-1  ? Concurrent nephrotoxic meds: cefepime    Assessment/Plan:  Recommendation(s)  ? Start 750mg  IV every 24 hours  ? Estimated trough on recommended regimen: 16 mg/L    Follow-up  ? Level due: prior to fourth or fifth dose  ? A pharmacist will continue to monitor and order levels as appropriate    Please page service pharmacist with questions/clarifications.    Orvilla Fus, PharmD

## 2018-06-04 LAB — BLOOD GAS CRITICAL CARE PANEL, ARTERIAL
BASE EXCESS ARTERIAL: -7.6 — ABNORMAL LOW (ref -2.0–2.0)
BASE EXCESS ARTERIAL: -11.3 — ABNORMAL LOW (ref -2.0–2.0)
CALCIUM IONIZED ARTERIAL (MG/DL): 5.1 mg/dL (ref 4.40–5.40)
CALCIUM IONIZED ARTERIAL (MG/DL): 4.8 mg/dL (ref 4.40–5.40)
CALCIUM IONIZED ARTERIAL (MG/DL): 4.53 mg/dL (ref 4.40–5.40)
FIO2 ARTERIAL: 40
GLUCOSE WHOLE BLOOD: 81 mg/dL
GLUCOSE WHOLE BLOOD: 92 mg/dL
HCO3 ARTERIAL: 19 mmol/L — ABNORMAL LOW (ref 22–27)
HCO3 ARTERIAL: 17 mmol/L — ABNORMAL LOW (ref 22–27)
HCO3 ARTERIAL: 16 mmol/L — ABNORMAL LOW (ref 22–27)
HEMOGLOBIN BLOOD GAS: 11.1 g/dL — ABNORMAL LOW (ref 12.00–16.00)
HEMOGLOBIN BLOOD GAS: 7.9 g/dL — ABNORMAL LOW (ref 12.00–16.00)
HEMOGLOBIN BLOOD GAS: 8.4 g/dL — ABNORMAL LOW (ref 12.00–16.00)
LACTATE BLOOD ARTERIAL: 0.8 mmol/L (ref <=1.2)
LACTATE BLOOD ARTERIAL: 0.6 mmol/L (ref <=1.2)
O2 SATURATION ARTERIAL: 95.6 % (ref 94.0–100.0)
O2 SATURATION ARTERIAL: 94.7 % (ref 94.0–100.0)
O2 SATURATION ARTERIAL: 96.6 % (ref 94.0–100.0)
PCO2 ARTERIAL: 52.3 mmHg — ABNORMAL HIGH (ref 35.0–45.0)
PCO2 ARTERIAL: 37 mmHg (ref 35.0–45.0)
PH ARTERIAL: 7.19 — CL (ref 7.35–7.45)
PH ARTERIAL: 7.28 — ABNORMAL LOW (ref 7.35–7.45)
PH ARTERIAL: 7.18 — CL (ref 7.35–7.45)
PO2 ARTERIAL: 92.2 mmHg (ref 80.0–110.0)
PO2 ARTERIAL: 75.9 mmHg — ABNORMAL LOW (ref 80.0–110.0)
PO2 ARTERIAL: 104 mmHg (ref 80.0–110.0)
POTASSIUM WHOLE BLOOD: 4.5 mmol/L (ref 3.4–4.6)
POTASSIUM WHOLE BLOOD: 3.9 mmol/L (ref 3.4–4.6)
POTASSIUM WHOLE BLOOD: 3.6 mmol/L (ref 3.4–4.6)
SODIUM WHOLE BLOOD: 141 mmol/L (ref 135–145)
SODIUM WHOLE BLOOD: 141 mmol/L (ref 135–145)

## 2018-06-04 LAB — CBC
HEMATOCRIT: 25.4 % — ABNORMAL LOW (ref 36.0–46.0)
HEMOGLOBIN: 8.8 g/dL — ABNORMAL LOW (ref 12.0–16.0)
HEMOGLOBIN: 8.3 g/dL — ABNORMAL LOW (ref 12.0–16.0)
MEAN CORPUSCULAR HEMOGLOBIN CONC: 31.8 g/dL (ref 31.0–37.0)
MEAN CORPUSCULAR HEMOGLOBIN CONC: 32.6 g/dL (ref 31.0–37.0)
MEAN CORPUSCULAR HEMOGLOBIN: 31.1 pg (ref 26.0–34.0)
MEAN CORPUSCULAR HEMOGLOBIN: 31.2 pg (ref 26.0–34.0)
MEAN CORPUSCULAR VOLUME: 97.6 fL (ref 80.0–100.0)
MEAN CORPUSCULAR VOLUME: 95.6 fL (ref 80.0–100.0)
MEAN PLATELET VOLUME: 9.4 fL (ref 7.0–10.0)
MEAN PLATELET VOLUME: 9.8 fL (ref 7.0–10.0)
PLATELET COUNT: 135 10*9/L — ABNORMAL LOW (ref 150–440)
RED BLOOD CELL COUNT: 2.84 10*12/L — ABNORMAL LOW (ref 4.00–5.20)
RED CELL DISTRIBUTION WIDTH: 18.3 % — ABNORMAL HIGH (ref 12.0–15.0)
RED CELL DISTRIBUTION WIDTH: 18.3 % — ABNORMAL HIGH (ref 12.0–15.0)
WBC ADJUSTED: 14.8 10*9/L — ABNORMAL HIGH (ref 4.5–11.0)
WBC ADJUSTED: 8.5 10*9/L (ref 4.5–11.0)

## 2018-06-04 LAB — BLOOD GAS, ARTERIAL
HCO3 ARTERIAL: 18 mmol/L — ABNORMAL LOW (ref 22–27)
O2 SATURATION ARTERIAL: 99.2 % (ref 94.0–100.0)
PCO2 ARTERIAL: 35.4 mmHg (ref 35.0–45.0)
PH ARTERIAL: 7.33 — ABNORMAL LOW (ref 7.35–7.45)
PO2 ARTERIAL: 144 mmHg — ABNORMAL HIGH (ref 80.0–110.0)

## 2018-06-04 LAB — CRYPTOCOCCAL ANTIGEN: Lab: NEGATIVE

## 2018-06-04 LAB — VANCOMYCIN RANDOM: Vancomycin^random:MCnc:Pt:Ser/Plas:Qn:: 9.8

## 2018-06-04 LAB — BASE EXCESS ARTERIAL
Base excess:SCnc:Pt:BldA:Qn:Calculated: -7 — ABNORMAL LOW
Base excess:SCnc:Pt:BldA:Qn:Calculated: -7.6 — ABNORMAL LOW

## 2018-06-04 LAB — MAGNESIUM: Magnesium:MCnc:Pt:Ser/Plas:Qn:: 2

## 2018-06-04 LAB — COMPREHENSIVE METABOLIC PANEL
ALBUMIN: 3.2 g/dL — ABNORMAL LOW (ref 3.5–5.0)
ALKALINE PHOSPHATASE: 46 U/L (ref 38–126)
ALT (SGPT): 17 U/L (ref 15–48)
ANION GAP: 14 mmol/L (ref 9–15)
AST (SGOT): 15 U/L (ref 14–38)
BILIRUBIN TOTAL: 0.7 mg/dL (ref 0.0–1.2)
BLOOD UREA NITROGEN: 100 mg/dL — ABNORMAL HIGH (ref 7–21)
BUN / CREAT RATIO: 34
CALCIUM: 8.8 mg/dL (ref 8.5–10.2)
CHLORIDE: 107 mmol/L (ref 98–107)
CO2: 20 mmol/L — ABNORMAL LOW (ref 22.0–30.0)
CREATININE: 2.9 mg/dL — ABNORMAL HIGH (ref 0.60–1.00)
EGFR CKD-EPI NON-AA FEMALE: 17 mL/min/{1.73_m2} — ABNORMAL LOW (ref >=60)
POTASSIUM: 4.7 mmol/L (ref 3.5–5.0)
SODIUM: 141 mmol/L (ref 135–145)

## 2018-06-04 LAB — CYCLOSPORINE (FPIA) BLOOD: Lab: 49

## 2018-06-04 LAB — SODIUM WHOLE BLOOD: Sodium:SCnc:Pt:Bld:Qn:: 141

## 2018-06-04 LAB — CO2: Carbon dioxide:SCnc:Pt:Ser/Plas:Qn:: 20 — ABNORMAL LOW

## 2018-06-04 LAB — CMV DNA, QUANTITATIVE, PCR: CMV VIRAL LD: NOT DETECTED

## 2018-06-04 LAB — HEMOGLOBIN BLOOD GAS: Hemoglobin:MCnc:Pt:Bld:Qn:: 7.9 — ABNORMAL LOW

## 2018-06-04 LAB — HEMATOCRIT: Lab: 25.4 — ABNORMAL LOW

## 2018-06-04 LAB — PHOSPHORUS: Phosphate:MCnc:Pt:Ser/Plas:Qn:: 8.9 — ABNORMAL HIGH

## 2018-06-04 LAB — CRYPTOCOCCAL ANTIGEN, SERUM: CRYPTOCOCCAL ANTIGEN: NEGATIVE

## 2018-06-04 LAB — CMV COMMENT: Lab: 0

## 2018-06-04 LAB — MEAN CORPUSCULAR HEMOGLOBIN: Lab: 31.1

## 2018-06-04 NOTE — Unmapped (Signed)
GASTROENTEROLOGY INPATIENT CONSULTATION H&P      Requesting Attending Physician:  Osvaldo Shipper, MD  Requesting Consult Service: Medical ICU (MDI)    Reason for Consult:    [name] is a 62 y.o. female seen in consultation at the request of Dr. Osvaldo Shipper, MD for post liver transplant.    Assessment and Recommendations:   [name] is a 62 y.o. 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  She has no concerns for graft failure or cirrhosis with preserved synthetic function and unremarkable LFTs.  Without cirrhosis, there is no contribution of hepatic encephalopathy to her mental status.  However, upon speaking to the family it is unclear if the patient was ever truly altered.  Agree with broad infectious work-up and evaluation due to her immunocompromise status.     -Continue cyclosporine 50mg  BID and check trough every morning. Goal level ~50-100.  -Agree with ICID consult, follow-up infectious work-up  -Please contact us if there are concerns for GI bleed moving forward in the setting of her transfusion requirement at outside hospital    Thank you for this consult. We will continue to follow along. Please page the GI hepatology pager at with any further questions.    Rutherford Limerick MD  Fellow PGY-4  [place] [facility] [place] at [facility]  Division of Gastroenterology & Hepatology    History of Present Illness:      Chief Complaint: Post liver transplant    HPI:   This is a 62 y.o. 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    She was admitted to the outside hospital on July 28 for hypoxic respiratory failure and subsequently intubated on August 1.  Her brother, who lives with her admits that she was progressively short of breath with intermittent fevers but denies any altered mentation prior to intubation.      During her course at the outside hospital, she required pressor therapy and she had several bronchoscopies with BAL.  Her bronchoscopy is on 8 4 and 8 5 showed mucous plugging and her infectious work-up from these have been negative thus far.  She was intermittently anemic throughout her stay and required several transfusions of unknown clear etiology.  At the outside hospital, the patient was placed on IV ceftaroline and Flagyl.  A CT scan at the outside hospital showed a possible perforated duodenal ulcer and so she was transferred to [facility] for further evaluation.  However a CT scan here makes no mention of a duodenal perforation or similar findings in the abdomen.  Per the patient's daughter, no observed melena, hematemesis, abdominal distention, significant lower extremity edema.      Review of Systems:  Patient unable to give review of systems    Medical History:   Allergies:  Iodinated contrast media; Ioxaglate sodium; and Latex    Medications:   Prior to Admission medications    Medication Dose, Route, Frequency   albuterol (PROVENTIL HFA;VENTOLIN HFA) 90 mcg/actuation inhaler 1-2 puffs, Inhalation, Every 6 hours PRN   calcitonin, salmon, (MIACALCIN) 200 unit/actuation nasal spray 1 spray, Alternating Nares, Daily (RT)   cholecalciferol, vitamin D3, (VITAMIN D3) 1,000 unit capsule 1,000 Units, Oral, Daily (standard), Frequency:   Dosage:0   UNIT  Instructions:  Note:1 po daily  metoprolol succinate (TOPROL-XL) 50 MG 24 hr tablet 50 mg, Oral, Daily (standard)   NEORAL 25 mg capsule TAKE 2 CAPSULES (50MG ) BY MOUTH TWICE DAILY   oxyCODONE (ROXICODONE) 15 MG immediate release tablet 15 mg, Oral, 4 times daily PRN  Patient taking differently: Take 10 mg by mouth 4 (four) times a day as needed.        Medical History:  Past Medical History:   Diagnosis Date   ??? Chronic kidney disease    ??? Cirrhosis (CMS-HCC)    ??? HTN (hypertension) Surgical History:  Past Surgical History:   Procedure Laterality Date   ??? BRONCHOSCOPY  06/03/2018        ??? HYSTERECTOMY     ??? 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 TONSILLECTOMY         Social History:  Social history reviewed with the patient's daughter  Tobacco use:   reports that she has never smoked. She has never used smokeless tobacco.  Alcohol use:   reports that she does not drink alcohol.  Drug use:  reports that she does not use drugs.    Family History:  Family history reviewed with the patient's daughter  Family History   Problem Relation Age of Onset   ??? Hypertension Mother    ??? Hypertension Father    ??? Prostate cancer Father    ??? Kidney disease Neg Hx    ??? Lupus Sister    ??? Lupus Brother        Review of Systems:  Review of systems not obtained, patient intubated and sedated    Objective:    Vital Signs/Weight:  Temp:  [36.5 ??C-36.6 ??C] 36.6 ??C  Core Temp:  [36.5 ??C-36.6 ??C] 36.5 ??C  Heart Rate:  [45-88] 67  SpO2 Pulse:  [48-87] 67  Resp:  [12-47] 19  BP: (158-194)/(96-106) 191/101  MAP (mmHg):  [122-133] 131  A BP-1: (158)/(75) 158/75  MAP:  [102 mmHg] 102 mmHg  A BP-2: (81-160)/(46-76) 118/61  MAP:  [58 mmHg-103 mmHg] 81 mmHg  FiO2 (%):  [40 %-100 %] 60 %  SpO2:  [92 %-100 %] 98 %  Wt Readings from Last 3 Encounters:   06/02/18 92.5 kg (203 lb 14.8 oz)   01/15/18 73.2 kg (161 lb 6.4 oz)   12/26/16 72.7 kg (160 lb 4.8 oz)       Physical Exam:  Constitutional: Intubated and sedated  HEENT: Moist mucous membranes  CV: Regular rate rhythm no extra heart sounds  Lung: Ventilated associated lung sounds, coarse  Abdomen: soft, normal bowel sounds, non-distended, non-tender. No organomegaly. No ascites.   Extremities: No edema, well perfused, SCDs in place  MSK: No joint swelling or tenderness noted, no deformities  Skin: No rashes no skin breakdown  Neuro: Intubated and sedated  Mental Status: Intubated and sedated    Diagnostic Studies:  I reviewed all pertinent diagnostic studies, including:      Labs:    Recent Labs     06/03/18  0352 06/03/18  0836 06/03/18  1530   WBC 13.3* 12.2* 7.7   HGB 8.7* 8.7* 7.8*   HCT 26.8* 26.7* 23.9*   PLT 167 160 139*     Recent Labs     06/02/18  2141 06/02/18  2147 06/03/18  0353   NA 137 140 139 - 138   K 4.7 4.6 4.5 - 4.4   CL 100  --  101   BUN 119*  --  108*   CREATININE 2.89*  --  2.87*   GLU 95  --  97     Recent Labs     06/02/18  2141 06/03/18  0353   PROT 6.9 - 6.9 6.7   ALBUMIN 3.6 - 3.6 3.5   AST 13* - 13* 12*   ALT 16 - 16 14*   ALKPHOS 49 - 49 49   BILITOT 0.7 - 0.7 0.6     No results for input(s): INR, APTT, FIBRINOGEN in the last 72 hours.  No results for input(s): ESR, CRP in the last 72 hours.  No results for input(s): IRON, TIBC, FERRITIN in the last 72 hours.  Lab Results   Component Value Date    HEPBCAB Nonreactive 12/07/2015    HEPBSAG Negative 10/09/2014    HEPCAB Nonreactive 12/07/2015    HIVAGAB Nonreactive 12/07/2015     Lab Results   Component Value Date    ANA POSITIVE (A) 05/30/2013       Imaging:   CT:   Impression     --Sequelae of prior liver transplant. There is diffuse mesenteric and body wall edema with no loculated fluid collections identified.       GI Procedures: None, no EGD at outside hospital per family

## 2018-06-04 NOTE — Unmapped (Signed)
Vancomycin Therapeutic Monitoring Pharmacy Note    Lauris Keepers is a 62 y.o. female continuing vancomycin. Date of therapy initiation: 06/02/18    Indication: Suspected infection     Prior Dosing Information: Current regimen 750 mg IV Q24 hours      Goals:  Therapeutic Drug Levels  Vancomycin trough goal: 15-20 mg/L    Additional Clinical Monitoring/Outcomes  Renal function, volume status (intake and output)    Results: Vancomycin random level 9.8 mcg/mL 8/12 AM    Wt Readings from Last 1 Encounters:   06/02/18 92.5 kg (203 lb 14.8 oz)     Creatinine   Date Value Ref Range Status   06/04/2018 2.90 (H) 0.60 - 1.00 mg/dL Final   29/56/2130 8.65 (H) 0.60 - 1.00 mg/dL Final   78/46/9629 5.28 (H) 0.60 - 1.00 mg/dL Final        Pharmacokinetic Considerations and Significant Drug Interactions:  ? Not accurate in setting of AKI  ? Concurrent nephrotoxic meds: not applicable    Assessment/Plan:  Recommendation(s)  ? Discontinue scheduled vancomycin in setting of AKI. After level  of 9.8 this morning patient was given scheduled vancomycin 750 mg IV x1. Will plan to give another 750 mg this morning to complete a 15 mg/kg dose.   ? Estimated trough on recommended regimen: Not applicable - dosing by level    Follow-up  ? Level due: with AM labs on 8/13  ? A pharmacist will continue to monitor and order levels as appropriate    Please page service pharmacist with questions/clarifications.    Christene Lye, PharmD

## 2018-06-04 NOTE — Unmapped (Signed)
Care Management  Initial Transition Planning Assessment              General  Care Manager assessed the patient by : In person interview with family, Medical record review, Discussion with Clinical Care team  Orientation Level: Other (Comment)(UTA) - pt intubated    CM met with pt's 2 adult children - Laporshia Hogen and Ramond Craver.  Pt was admitted to [facility] on 05/20/18 and transferred to [facility] 8/10.  Previously pt was last hospitalized in December 2018.    Pt is single with 2 children: they are her decision makers while she is unable to make her decisions.    Pt lives with her son Christen Bame.  She was independent- using a walker or cane at times.  Pt  Was driving herself as needed.    CM provided info about [facility] - no referral needed a this time. Provided [name] and pt's sister Rowe Clack with letters for work.    Type of Residence: Mailing Address:    [address] [place]  [place] [address]  Patient Phone Number: [phone]        Medical Provider(s): Soledad Gerlach, MD  Reason for Admission: Admitting Diagnosis:  bowel perforation  Past Medical History:   has a past medical history of Chronic kidney disease, Cirrhosis (CMS-HCC), and HTN (hypertension).  Past Surgical History:   has a past surgical history that includes Liver transplantation (1996); Hysterectomy; Tonsillectomy; and Bronchoscopy (06/03/2018).   Previous admit date: N/A    Primary Insurance- Payor: MEDICAID [place] / Plan: MEDICAID [place] ACCESS / Product Type: *No Product type* /   Secondary Insurance ??? None  Prescription Coverage ??? yes  Preferred Pharmacy - [id number] [place], [place] - [address] AT [address]  [facility]    Transportation home: TBD  Level of function prior to admission: Independent      Contact/Decision Maker        Extended Emergency Contact Information  Primary Emergency Contact: Mondesir,Ronnie  Address: unknown           [place], [place] [address] [place]  Home Phone: [phone]  Relation: Son  Secondary Emergency Contact: [name]  Address: unknown           [place], [place] [address] [place]  Home Phone: [phone]  Relation: Sister    Legal Next of Kin / Guardian / POA / Advance Directives       Advance Directive (Medical Treatment)  Does patient have an advance directive covering medical treatment?: Patient does not have advance directive covering medical treatment., Unable to assess (Pt. cognitively impaired, and/or unaccompanied).  Reason patient does not have an advance directive covering medical treatment:: Patient does not wish to complete one at this time  Reason there is not a Health Care Decision Maker appointed:: Patient does not wish to appoint a Health Care Decision Maker at this time  Information provided on advance directive:: Yes  Patient requests assistance:: No         Patient Information  Lives with: Children    Type of Residence: Private residence - 1st floor apt with no steps to enter        Support Systems: Children, Family Members    Responsibilities/Dependents at home?: No       Equipment Currently Used at Home: none       Currently receiving outpatient dialysis?: No  Financial Information       Need for financial assistance?: No         Discharge Needs Assessment  Concerns to be Addressed: adjustment to diagnosis/illness    Clinical Risk Factors: New Diagnosis, Multiple Diagnoses (Chronic)    Barriers to taking medications: No    Prior overnight hospital stay or ED visit in last 90 days: No       Anticipated Changes Related to Illness: inability to care for self         Discharge Facility/Level of Care Needs: other (see comments)(back transfer to [facility])    Readmission  Risk of Unplanned Readmission Score: UNPLANNED READMISSION SCORE: 15%  Readmitted Within the Last 30 Days?   Patient at risk for readmission?: Yes    Discharge Plan  Screen findings are: Discharge planning needs identified or anticipated (Comment). CM will follow for DC needs    Expected Discharge Date: TBD    Expected Transfer from Critical Care:      Patient and/or family were provided with choice of facilities / services that are available and appropriate to meet post hospital care needs?: Other (Comment) CM will offer choice as appropriate       Initial Assessment complete?: Yes

## 2018-06-04 NOTE — Unmapped (Signed)
MICU Accept Note     Date of Service: 06/04/2018    Problem List:       Interval history: Katherine Norton is a 62 y.o. female with  PMHx of liver transplant (1999) on immunosuppresion, chronic back pain, anxiety, HTN, CKD,  that presents to [facility] as a transfer from [facility], with respiratory failure and possible contained duodenal perforation.      24hr events: adm to SICU yesterday from OSH.  No acute surgical needs therfore tx to MICU for continued care    Neurological   #acute on chronic pain/sedation  - will transition from fentanyl gtt to prn morphine  For RAS 0-1  - add oxy 10 mg scheduled every 8 hrs    #acute encephalopathy: Resolved-  presented to OSH altered, but also hypoxic at that time. Currently [facility]    Pulmonary   #acute hypoxic respiratory failure likely due to multifocal PNA.  Intubated on 8/1 CT chest with Scattered airspace opacities in the left apex, left upper lobe and superior segment of left lower lobe; concerning for pneumonia.  Currently on PRVC 12/300/60% +8 PEEP with most recent ABG 7.24/46/99/97%.  Will adjust minute ventilation for resp acidosis and wean Fi02.    -sedation as needed for vent compliance.    8/12: Plan SBT once sedation is lowered.  May need diuresis as well.     Cardiovascular   #h/o hypertension: hold home metoprolol and amlodipine as recently off pressor  - prn hydralazine    Echo 7/29: EF 65-70%, mod pulm HTN and borderline dilated RV  Renal   #Acute on chronic renal failure  - Cr baseline appears to be ~1.7--> 3-->2.87  - Was on diuresis regimen at OSH, but was net pos due to enteral feeds  - Trend and may require CRRT or diuresis regimen    Infectious Disease/Autoimmune   #septic shock due to ? PNA.  Initially seen at OSH and had c/f perforated duodenal ulcer, but CT scan here non concerning.  CT chest with possible multifocal PNA.    - appreciate ID's assistance.    Abx:   Vanco (8/11-->8/12)  Mica (8/11-->8/12)  Flagyl (8/10-->)  Cefepime  (8/10-->) Voriconazole (8/12-->)    Cx:  8/11: fungal cx: some fungal elements  8/11: LRC: pending  8/10: blood cx pending  8/10: BAL: pending    Abx: OSH  ceftaroline and flagyl  levaquin  meropenem     Cx OSH:  - 7/29 Strep pneumo Ag neg, RVP neg, urine Legionella Ag neg  - 7/29 bd cx NGTD  OSH: Fungal, pneumocystis/histoplasma, AFB, Gram on BAL all were negative.  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  Per surgery, NO perf.      #abd pain: pt endorsing abd pain.  Will check lipase.  Start TF if no plans to extubate.     Malnutrition Assessment: Not done yet.         Heme/Coag   Acute blood loss- no s/s of bleeding  - Hgb drop to 5.8 at OSH with transfusion  - cbc stable.    - DVT ppx held due to drop in h/h.  Restarted.      Endocrine  nai    Prophylaxis/LDA/Restraints/Consults   Can CVC be removed? No: need for medications requiring central access (e.g. pressors)   Can A-line be removed? No: frequent ABGs  Can Foley be removed? No: Need continuous I/O  Mobility plan: Step 1 - Range of motion    Feeding: will start if not extubated  Analgesia: Pain adequately controlled  Sedation SAT/SBT: No PEEP > 8  Thrombembolic ppx: SQ heparin  Head of bed: >30 deg  Ulcer ppx: Yes, mechanical ventilation > 3 days, not on tube feeds  Glucose within target range: Yes, in range    RASS at goal? Yes  [place] Agitation Assessment Scale (RASS) : -1 (06/04/2018 10:00 AM)     Can antipsychotics be stopped? N/A, not on antipsychotics  CAM-ICU Result: Positive (06/04/2018  8:00 AM)      Patient Lines/Drains/Airways Status    Active Active Lines, Drains, & Airways     Name:   Placement date:   Placement time:   Site:   Days:    ETT  7.5   06/02/18    2123     1    CVC Triple Lumen 06/03/18 Non-tunneled Right Internal jugular   06/03/18    1503    Internal jugular   less than 1    NG/OG Tube Right nostril   06/02/18    2121    Right nostril   1    Urethral Catheter   06/02/18    2118    ???   1    Peripheral IV 06/02/18 Posterior;Left Forearm   06/02/18    2120    Forearm   1    Arterial Line 06/03/18 Right Radial   06/03/18    0007    Radial   1              Patient Lines/Drains/Airways Status    Active Wounds     None                Goals of Care     Code Status: Full Code    Designated Healthcare Decision Maker:  [name] currently lacks decisional capacity for healthcare decision-making and is unable to designate a surrogate healthcare decision maker. [name] designated healthcare decision maker(s) is/are malaia buchta and [name] (the patient's adult child) as denoted by hospital policy for patients without a known preference.      Subjective   Katherine Norton is a 62 y.o. female with PMHx of liver transplant (1999) on immunosuppresion, chronic back pain, anxiety, HTN, CKD,  that presents to [facility] as a transfer from [facility]. Patient initially presented to the OSH on 05/20/18 due to acute worsening shortness of breath and altered mental status. Patient was sating 68% on RA, but 90% on CPAP. Patient had severe acidemia on ABG as well as acute on chronic renal failure with Cr of 3.89. CXR at that time showed consolidation with concern for pneumonia and was febrile. Patient's initial lactate was 2.70. Patient was started on IV ceftaroline and flagyl initially.  ??  Patient was intubated on 05/24/18 due to worsening respiratory status and needed vasopressors on/off for initial 24 hours. AKI continued to persist despite resuscitation, lasix gtt, and bolus pushes of lasix. Patient is estimated to be approximately 15L positive even with diuresis as most of input is from enteral feeds. Patient underwent bronch on 8/4 and 8/5 for significant mucus plugging. Patient was switched to merrem by ID and d/c  levaquin. Fungal, pneumocystis/histoplasma, AFB, Gram on BAL all were negative. Patient had a recent CT Abd/pelvis/chest done which showed a possible contained perforated duodenal ulcer  ??  Today patient became increasingly more tachypneic with RR in the 40s. Changes to sedation from precedex to propofol did not help with this. Given if patient would require surgery, the complicated nature of it, decided to transfer to a liver transplant facility (patient had transplant done here). In addition patient's hemoglobin reached a low of 5.8 requiring pRBC transfusion.    Objective     Vitals - past 24 hours  Temp:  [36.6 ??C (97.9 ??F)-36.7 ??C (98.1 ??F)] 36.7 ??C (98.1 ??F)  Heart Rate:  [45-88] 71  SpO2 Pulse:  [48-87] 70  Resp:  [14-47] 22  FiO2 (%):  [50 %-100 %] 60 %  SpO2:  [95 %-100 %] 100 % Intake/Output  I/O last 3 completed shifts:  In: 4009.5 [I.V.:2324.5; NG/GT:400; IV Piggyback:1285]  Out: 2880 [Urine:2580; Stool:300]     Physical Exam:    General: obese female, awake in NAD  HEENT: PERRLA  CV: RRR, no mrg  Pulm: coarse bs bilaterally  GI: soft, tender diffusely to palpation  MSK: no edema  Skin: intact  Neuro: awake, FC, mae      Continuous Infusions:   ??? fentaNYL citrate (PF) 125 mcg/hr (06/04/18 1610)       Scheduled Medications:   ??? calcitonin (salmon)  1 spray Alternating Nares Daily (RT)   ??? Cefepime  1 g Intravenous Q12H   ??? cycloSPORINE  50 mg NG tube BID   ??? metronidazole  500 mg Intravenous [facility]   ??? pantoprazole  40 mg Intravenous BID   ??? vancomycin  750 mg Intravenous Q24H       PRN medications:      Data/Imaging Review: Reviewed in Epic and personally interpreted on 06/04/2018. See EMR for detailed results.     .  Critical Care Attestation     This patient is critically ill or injured with the impairment of vital organ systems such that there is a high probability of imminent or life threatening deterioration in the patient's condition. This patient must remain in the ICU for ongoing evaluation of the comprehensive management plan outlined in this note. I directly provided critical care services as documented in this note and the critical care time spent (30 min) is exclusive of separately billable procedures.    Draxton Luu Paulino Door, PA

## 2018-06-04 NOTE — Unmapped (Signed)
Pt arrived from SICU ventilated and on Fentanyl gtt @ 100 for sedation. Pt alert, following commands, but visibly uncomfortable and in pain. RASS increased to +1 during admission and drip was increased to 125. Pt tolerating well. ABG sent per order. Will follow up with team. No acute events. Will continue to monitor closely.

## 2018-06-04 NOTE — Unmapped (Signed)
Patient remained on PRVC settings throughout shift.  Increased VT and RR due to ABG results. #7.5 ETT secured with tube holder 23@ lip, no skin breakdown noted.  ETS for moderate to large amount of thick, old blood tinged secretions.  Plan to continue vent support.

## 2018-06-04 NOTE — Unmapped (Addendum)
Katherine Norton a 62 y.o.??female??with PMHx of liver transplant (1999) on immunosuppresion, chronic back pain, anxiety, HTN, CKD,????that presents to [facility] as a transfer from [facility]. Patient initially presented to the OSH on 05/20/18 due to acute worsening shortness of breath and altered mental status. Patient was sating 68% on RA, but 90% on CPAP. Patient had severe acidemia on ABG as well as acute on chronic renal failure with Cr of 3.89. CXR at that time showed consolidation with concern for pneumonia and was febrile. Patient's initial lactate was 2.70. Patient was started on IV ceftaroline and flagyl initially.  ??  Patient was intubated on 05/24/18 due to worsening respiratory status and needed vasopressors on/off for initial 24 hours. AKI continued to persist despite resuscitation, lasix gtt, and bolus pushes of lasix. Patient is estimated to be approximately 15L positive even with diuresis as most of input is from enteral feeds. Patient underwent bronch on 8/4 and 8/5 for significant mucus plugging. Patient was switched to merrem by ID and d/c levaquin. Fungal, pneumocystis/histoplasma, AFB, Gram on BAL all were negative. Patient had a recent CT Abd/pelvis/chest done which showed a possible contained perforated duodenal ulcer  ??  8/10 patient became increasingly more tachypneic with RR in the 40s. Changes to sedation from precedex to propofol did not help with this. Given if patient would require surgery, the complicated nature of it, decided to transfer to a liver transplant facility (patient had transplant done here). In addition patient's hemoglobin reached a low of 5.8 requiring pRBC transfusion.    Adm to SICU.  rescanned abd and chest.  No perf per surgery and no need for their services and tx to MICU.     Weaned off fentanyl gtt and added scheduled oxy (on home oxy) and extubated but failed, and was a complicated re-intubation. Underwent 1 round of CPR following intubation, likely driven by hypoxia. Mentally intact. Optimizing on vent prior to trialling extubation again vs trach  .     #acute hypoxic respiratory failure likely due to multifocal PNA, ARDS.  Intubated on 8/1 at OSH CT chest with Scattered airspace opacities in the left apex, left upper lobe and superior segment of left lower lobe; concerning for pneumonia.  Extubated 8/12 and reintubated 8/13 for progressive hypoxia and inc WOB .- Esophogeal balloon eval on 8/14 showed optimal PEEP 14.  Kept on PEEP of 14 until pt extubated 8/25.  Pt required intermittent hiflo and bipap and then reintubated on 8/28 am after R sided plug/collpase.    S/p Trach by ENT on 9/3.  Has been doing TC - 7 hrs on 9/4 and rested on PRVC overnight.  Returned to [employer] this am.    - tolerating PRVC well >> TCT as tol  - peridex for vap ppx  - aggressive pulm toilet as tol  - hypertonic nebs  - speech therapy consulted for PMV trials >> will reassess 48hrs post trach     Per ENT: please do not change trach until POD5 and when stable off the ventilator for at least 48 hours    #acute HFrEF  #h/o hypertension  # PEA arrest peri-intubation #1 (CPR and epi, ROSC within 1 min)   - hold home metoprolol and amlodipine   - prn hydralazine  - Echo 7/29: EF 65-70%, mod pulm HTN and borderline dilated RV   - repeat echo 8/16 EF >55%, severe RV fx and dilated RA, severe phtn, mod TR    #Acute on chronic renal failure  -  Cr baseline appears to be ~1.7--> 3-->2.87  - nephrology consulted 8/16, appreciate recs >> CRRT started 8/22, clotted off 8/25. First iHD on 8/27 with 2.5 L removed.  Tolerated well.      - foley removed 8/27, consider bladder scan daily  - last iHD tolerated well for 2L UF, next 9/5 am prior to transfer  - cont to have intmt incontinent voids, consider purewick for accurate I/O     #septic shock due to ? PNA.  Initially seen at OSH and had c/f perforated duodenal ulcer, but CT scan here non concerning.  CT chest with possible multifocal PNA.    - appreciate ID's assistance. ID signed off 8/17  - WOCN consulted for intertrigo and sacral wound (see recs 8/19)  - last Blood/urine/sputum cxs sent 8/21, abx given x 1 dose  ??  Abx:   Cefepime (8/22- 8/22)  Fluconazole (8/22- 8/22)  Vanco (8/11-->8/12)  Mica (8/11-->8/12)  Flagyl (8/10-->8/14)  Cefepime  (8/10-->8/16)  Voriconazole (8/12-->8/15)  Linezolid (8/15-8/16)  ??  Abx: OSH  ceftaroline and flagyl  levaquin  meropenem     Cx OSH:  - 7/29 Strep pneumo Ag neg, RVP neg, urine Legionella Ag neg  - 7/29 bd cx NGTD  OSH: Fungal, pneumocystis/histoplasma, AFB, Gram on BAL all were negative.    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

## 2018-06-04 NOTE — Unmapped (Signed)
IMMUNOCOMPROMISED HOST INFECTIOUS DISEASE PROGRESS NOTE    Assessment/Plan:     Katherine Norton is a 62 y.o. female    ID Problem List:  #Hx of cryptogenic cirrhosis s/p open liver transplant (1996)  -Serologies: unknown  -Immunosuppression: cyclosporine BID (goal ~50-100)  ??  #Co-morbiditis: acute on CKD, chronic back pain  ??  #Acute hypoxic respiratory failure, concern for pneumonia - 06/02/18  - 7/28 OSH abnormal chest imaging - diffuse patchy opacities in R lung; pulm edema, R lobar pneumonia, CAP vs. Aspiration  - 7/29 Strep pneumo Ag neg, RVP neg, urine Legionella Ag neg  - 7/29 bd cx NGTD  - 8/1 intubated for worsening respiratory status  - 8/3 urine Histo Ag neg, fungal ab neg (aspergillus, blasto and histo Ab)  - 8/4 BAL WBC 911, RBC 111, cloudy; resp quant cx <1k, Gram stain no orgs seen, DFA pneumocystis neg. No cytologic e/o malignancy.  - 8/10 transferred from OSH [facility]) to [facility]  - 06/03/18 bd cx sent  - 06/03/18 BAL  ---Gram stain: 3+ PMN, 1+ yeast  ---Fungal stain: +fungal elements present  ---Cx pending  - 06/03/18 CT chest w/o contrast: scattered airspace opacities in L apex, LUL and superior segment of LLL, concerning for pneumonia  - 06/03/18 CT A/P w/o contrast: prior liver transplant, diffuse mesenteric and body wall edema with no loculated fluid collections  - 06/03/18 hypotension s/p bronchoscopy, thought to be related to sedation    Recommendations:  Dx:  - f/u serum cryptococcus Ag  - f/u CMV, EBV PCR in blood  - f/u serum Aspergillus galactomannan  - f/u BAL cx, fungal cx (requested DFA pneumocystis, AFB Cx in addition)  - f/u bd cx  - f/u fungitell assay  - f/u OSH cx  - Baseline EKG to check for QTc  ??  Tx:  -Continue cefepime and flagyl for empiric coverage  -Stop vancomycin IV   -Assuming QTc is ok, start voriconazole for empiric fungal coverage (fungal elements seen on BAL fungal stain)    The [facility] ID service will continue to follow.  Please page the ID Transplant/Liquid Oncology Fellow consult at [phone] with questions.  Patient discussed with Dr. Reynold Bowen.    Jarome Matin, MD  Fellow, [facility] Division of Infectious Diseases     Attending attestation  I was available. I discussed the plan with the fellow and agree with the plan of care.  Darlen Round, MD      Subjective:   -Patient is awake and following commands  -Communicates via shaking head yes/no  -Endorses tenderness in abdomen  -Endorses discomfort with ETT  -Denies recent travel       Medications:  Current antibiotics:  Cefepime 1g q12h IV   Metronidazole 500mg  q8h NG  ??  Previous antibiotics:  Micafungin 100mg  q24h IV??    Current/Prior immunomodulators:  Cyclosporine 50mg  q12h NG  ??  Other medications reviewed.     Other medications reviewed.    Objective:     Vital Signs last 24 hours:  Temp:  [36.7 ??C] 36.7 ??C  Core Temp:  [36.5 ??C-36.8 ??C] 36.7 ??C  Heart Rate:  [45-87] 71  SpO2 Pulse:  [48-87] 72  Resp:  [14-40] 22  A BP-2: (81-147)/(46-77) 147/74  MAP:  [58 mmHg-102 mmHg] 99 mmHg  FiO2 (%):  [50 %-100 %] 50 %  SpO2:  [95 %-100 %] 98 %    Physical Exam:  Patient Lines/Drains/Airways Status    Active Active Lines, Drains, & Airways  Name:   Placement date:   Placement time:   Site:   Days:    ETT  7.5   06/02/18    2123     1    CVC Triple Lumen 06/03/18 Non-tunneled Right Internal jugular   06/03/18    1503    Internal jugular   less than 1    NG/OG Tube Right nostril   06/02/18    2121    Right nostril   1    Urethral Catheter   06/02/18    2118    ???   1    Peripheral IV 06/02/18 Posterior;Left Forearm   06/02/18    2120    Forearm   1    Arterial Line 06/03/18 Right Radial   06/03/18    0007    Radial   1              GEN:  alert, awake, on ventilator  EYES: sclerae anicteric and non injected  ENT:ETT in place  LYMPH:no cervical LAD  CV:RRR and no abnormal heart sounds noted  PULM:on ventilator, coarse breath sounds bilaterally  EPP:IRJJ, diffuse, mild tenderness with palpation, obese  OA:CZYSA catheter in place RECTAL:deferred  SKIN:no petechiae, ecchymoses or obvious rashes on clothed exam  YTK:ZSWF neck ROM  NEURO:follows commands and communicates via nodding and no tremor noted    Labs:  Lab Results   Component Value Date    WBC 8.5 06/04/2018    WBC 14.8 (H) 06/04/2018    WBC 11.0 06/03/2018    WBC 3.3 (L) 04/05/2018    WBC 4.2 03/09/2018    WBC 7.4 01/12/2018    HGB 8.3 (L) 06/04/2018    HGB 7.0 (L) 06/04/2018    HCT 25.4 (L) 06/04/2018    HCT 30.8 (L) 04/05/2018    PLT 135 (L) 06/04/2018    PLT 163 04/05/2018    NEUTROABS 1.9 04/05/2018    LYMPHSABS 0.9 04/05/2018    EOSABS 0.1 04/05/2018    NA 142 06/04/2018    K 3.8 06/04/2018    BUN 100 (H) 06/04/2018    BUN 30 (H) 04/05/2018    CREATININE 2.90 (H) 06/04/2018    CREATININE 2.87 (H) 06/03/2018    CREATININE 2.89 (H) 06/02/2018    CREATININE 2.32 (H) 04/05/2018    CREATININE 2.23 (H) 03/09/2018    CREATININE 1.96 (H) 01/12/2018    GLU 99 06/04/2018    GLU 115 (H) 04/05/2018    MG 2.0 06/04/2018    MG 2.0 04/05/2018    ALBUMIN 3.2 (L) 06/04/2018    ALBUMIN 3.9 12/04/2014    BILITOT 0.7 06/04/2018    BILITOT 0.4 04/05/2018    AST 15 06/04/2018    AST 14 04/05/2018    ALT 17 06/04/2018    ALT 6 04/05/2018    ALKPHOS 46 06/04/2018    ALKPHOS 90 04/05/2018    INR 1.1 05/22/2015    ESR 45 (H) 01/17/2013    CRP <0.5 01/17/2013       Imaging:  Imaging was reviewed in Epic. Pertinent findings include as below.  and Imaging was personally reviewed   CXR 06/03/18  Lungs hypoinflated. Patchy parenchymal and interstitial opacities with prominent central pulmonary vasculature, consistent with edema. Bibasilar atelectasis. Small bilateral pleural effusions, similar to prior.      Microbiology:  Please see the problem list above for the pertinent micro

## 2018-06-05 DIAGNOSIS — J9601 Acute respiratory failure with hypoxia: Principal | ICD-10-CM

## 2018-06-05 LAB — MEAN CORPUSCULAR VOLUME: Lab: 98.5

## 2018-06-05 LAB — SODIUM: Sodium:SCnc:Pt:Ser/Plas:Qn:: 142

## 2018-06-05 LAB — BLOOD GAS, ARTERIAL
BASE EXCESS ARTERIAL: -7.2 — ABNORMAL LOW (ref -2.0–2.0)
HCO3 ARTERIAL: 17 mmol/L — ABNORMAL LOW (ref 22–27)
HCO3 ARTERIAL: 18 mmol/L — ABNORMAL LOW (ref 22–27)
O2 SATURATION ARTERIAL: 95.1 % (ref 94.0–100.0)
PCO2 ARTERIAL: 45.4 mmHg — ABNORMAL HIGH (ref 35.0–45.0)
PCO2 ARTERIAL: 38.4 mmHg (ref 35.0–45.0)
PH ARTERIAL: 7.19 — CL (ref 7.35–7.45)
PH ARTERIAL: 7.29 — ABNORMAL LOW (ref 7.35–7.45)

## 2018-06-05 LAB — BLOOD GAS CRITICAL CARE PANEL, ARTERIAL
BASE EXCESS ARTERIAL: -9.4 — ABNORMAL LOW (ref -2.0–2.0)
CALCIUM IONIZED ARTERIAL (MG/DL): 4.85 mg/dL (ref 4.40–5.40)
GLUCOSE WHOLE BLOOD: 59 mg/dL
GLUCOSE WHOLE BLOOD: 58 mg/dL
HCO3 ARTERIAL: 17 mmol/L — ABNORMAL LOW (ref 22–27)
HCO3 ARTERIAL: 16 mmol/L — ABNORMAL LOW (ref 22–27)
HEMOGLOBIN BLOOD GAS: 7 g/dL — ABNORMAL LOW (ref 12.00–16.00)
HEMOGLOBIN BLOOD GAS: 8.4 g/dL — ABNORMAL LOW (ref 12.00–16.00)
LACTATE BLOOD ARTERIAL: 0.7 mmol/L (ref <=1.2)
O2 SATURATION ARTERIAL: 98.5 % (ref 94.0–100.0)
O2 SATURATION ARTERIAL: 95.3 % (ref 94.0–100.0)
PCO2 ARTERIAL: 36 mmHg (ref 35.0–45.0)
PCO2 ARTERIAL: 33 mmHg — ABNORMAL LOW (ref 35.0–45.0)
PH ARTERIAL: 7.29 — ABNORMAL LOW (ref 7.35–7.45)
PH ARTERIAL: 7.3 — ABNORMAL LOW (ref 7.35–7.45)
PO2 ARTERIAL: 83 mmHg (ref 80.0–110.0)
POTASSIUM WHOLE BLOOD: 3.8 mmol/L (ref 3.4–4.6)
POTASSIUM WHOLE BLOOD: 3.7 mmol/L (ref 3.4–4.6)
SODIUM WHOLE BLOOD: 144 mmol/L (ref 135–145)

## 2018-06-05 LAB — PRO-BNP
Natriuretic peptide.B prohormone N-Terminal:MCnc:Pt:Ser/Plas:Qn:: 55500 — ABNORMAL HIGH
Natriuretic peptide.B prohormone N-Terminal:MCnc:Pt:Ser/Plas:Qn:: 65000 — ABNORMAL HIGH
Natriuretic peptide.B prohormone N-Terminal:MCnc:Pt:Ser/Plas:Qn:: 75000 — ABNORMAL HIGH

## 2018-06-05 LAB — PHOSPHORUS
Phosphate:MCnc:Pt:Ser/Plas:Qn:: 9.5 — ABNORMAL HIGH
Phosphate:MCnc:Pt:Ser/Plas:Qn:: 9.8 — ABNORMAL HIGH

## 2018-06-05 LAB — LACTATE BLOOD ARTERIAL
Lactate:SCnc:Pt:BldA:Qn:: 0.7
Lactate:SCnc:Pt:BldA:Qn:: 1.1

## 2018-06-05 LAB — BASIC METABOLIC PANEL
ANION GAP: 17 mmol/L — ABNORMAL HIGH (ref 9–15)
BLOOD UREA NITROGEN: 97 mg/dL — ABNORMAL HIGH (ref 7–21)
BUN / CREAT RATIO: 33
CALCIUM: 8.7 mg/dL (ref 8.5–10.2)
CREATININE: 2.98 mg/dL — ABNORMAL HIGH (ref 0.60–1.00)
EGFR CKD-EPI AA FEMALE: 19 mL/min/{1.73_m2} — ABNORMAL LOW (ref >=60)
EGFR CKD-EPI NON-AA FEMALE: 16 mL/min/{1.73_m2} — ABNORMAL LOW (ref >=60)
GLUCOSE RANDOM: 117 mg/dL (ref 65–179)
POTASSIUM: 4.2 mmol/L (ref 3.5–5.0)
SODIUM: 142 mmol/L (ref 135–145)

## 2018-06-05 LAB — CALCIUM IONIZED ARTERIAL (MG/DL): Calcium.ionized:MCnc:Pt:Bld:Qn:: 4.53

## 2018-06-05 LAB — CBC
HEMATOCRIT: 24.8 % — ABNORMAL LOW (ref 36.0–46.0)
HEMATOCRIT: 25.5 % — ABNORMAL LOW (ref 36.0–46.0)
HEMOGLOBIN: 8.1 g/dL — ABNORMAL LOW (ref 12.0–16.0)
MEAN CORPUSCULAR HEMOGLOBIN CONC: 31.6 g/dL (ref 31.0–37.0)
MEAN CORPUSCULAR HEMOGLOBIN CONC: 32.5 g/dL (ref 31.0–37.0)
MEAN CORPUSCULAR HEMOGLOBIN: 31.1 pg (ref 26.0–34.0)
MEAN CORPUSCULAR HEMOGLOBIN: 31.7 pg (ref 26.0–34.0)
MEAN CORPUSCULAR VOLUME: 97.5 fL (ref 80.0–100.0)
MEAN CORPUSCULAR VOLUME: 98.5 fL (ref 80.0–100.0)
MEAN PLATELET VOLUME: 10 fL (ref 7.0–10.0)
MEAN PLATELET VOLUME: 9.2 fL (ref 7.0–10.0)
PLATELET COUNT: 122 10*9/L — ABNORMAL LOW (ref 150–440)
PLATELET COUNT: 154 10*9/L (ref 150–440)
RED BLOOD CELL COUNT: 2.55 10*12/L — ABNORMAL LOW (ref 4.00–5.20)
RED BLOOD CELL COUNT: 2.59 10*12/L — ABNORMAL LOW (ref 4.00–5.20)
RED CELL DISTRIBUTION WIDTH: 18.2 % — ABNORMAL HIGH (ref 12.0–15.0)
RED CELL DISTRIBUTION WIDTH: 18.3 % — ABNORMAL HIGH (ref 12.0–15.0)

## 2018-06-05 LAB — COMPREHENSIVE METABOLIC PANEL
ALBUMIN: 3 g/dL — ABNORMAL LOW (ref 3.5–5.0)
ALKALINE PHOSPHATASE: 44 U/L (ref 38–126)
ALT (SGPT): 14 U/L — ABNORMAL LOW (ref 15–48)
ANION GAP: 15 mmol/L (ref 9–15)
AST (SGOT): 23 U/L (ref 14–38)
BLOOD UREA NITROGEN: 95 mg/dL — ABNORMAL HIGH (ref 7–21)
BUN / CREAT RATIO: 32
CHLORIDE: 108 mmol/L — ABNORMAL HIGH (ref 98–107)
CO2: 19 mmol/L — ABNORMAL LOW (ref 22.0–30.0)
CREATININE: 2.97 mg/dL — ABNORMAL HIGH (ref 0.60–1.00)
EGFR CKD-EPI AA FEMALE: 19 mL/min/{1.73_m2} — ABNORMAL LOW (ref >=60)
EGFR CKD-EPI NON-AA FEMALE: 16 mL/min/{1.73_m2} — ABNORMAL LOW (ref >=60)
GLUCOSE RANDOM: 67 mg/dL (ref 65–179)
POTASSIUM: 4.5 mmol/L (ref 3.5–5.0)
PROTEIN TOTAL: 6.1 g/dL — ABNORMAL LOW (ref 6.5–8.3)
SODIUM: 142 mmol/L (ref 135–145)

## 2018-06-05 LAB — ALBUMIN: Albumin:MCnc:Pt:Ser/Plas:Qn:: 3 — ABNORMAL LOW

## 2018-06-05 LAB — ASPERGILLUS AG SERUM: Lab: <0.5

## 2018-06-05 LAB — CALCIUM: Calcium:MCnc:Pt:Ser/Plas:Qn:: 8.7

## 2018-06-05 LAB — O2 SATURATION ARTERIAL: Oxygen saturation:MFr:Pt:BldA:Qn:: 95.1

## 2018-06-05 LAB — HCO3 ARTERIAL: Bicarbonate:SCnc:Pt:BldA:Qn:: 17 — ABNORMAL LOW

## 2018-06-05 LAB — MAGNESIUM
MAGNESIUM: 1.9 mg/dL (ref 1.6–2.2)
Magnesium:MCnc:Pt:Ser/Plas:Qn:: 1.9
Magnesium:MCnc:Pt:Ser/Plas:Qn:: 1.9

## 2018-06-05 LAB — HEMOGLOBIN BLOOD GAS: Hemoglobin:MCnc:Pt:Bld:Qn:: 8.4 — ABNORMAL LOW

## 2018-06-05 LAB — CYCLOSPORINE (FPIA) BLOOD: Lab: 49

## 2018-06-05 LAB — PLATELET COUNT: Lab: 122 — ABNORMAL LOW

## 2018-06-05 LAB — EBV VIRAL LOAD RESULT: Lab: NOT DETECTED

## 2018-06-05 NOTE — Unmapped (Signed)
Patient re-intubated earlier. in shift, placed on PRVC settings.  Prior to intubation, ETS large amount of thick yellow secretions nasotracheally.  ETT secured 23@ lip.  ETS for moderate, thick, bloody secretions.  Will continue to monitor.

## 2018-06-05 NOTE — Unmapped (Signed)
GASTROENTEROLOGY TREATMENT PLAN NOTE     Requesting Attending Physician :  Maryclare Labrador, MD    Reason for Consult:    Katherine Norton is a 62 y.o. female seen in consultation at the request of Dr. Maryclare Labrador, MD for cyclosporine management.    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 ??She has no concerns for graft failure or??cirrhosis with preserved synthetic function and unremarkable LFTs. ??Without cirrhosis, there is no contribution of hepatic encephalopathy to her mental status. ??However,??upon speaking to the family it is unclear if the patient was ever truly??altered. ??Agree with broad infectious work-up and evaluation due to her immunocompromise status.   Cyclosporine levels have ranged 43-49 since admission, which is almost at goal, therefore no dose adjustment is needed.  Hgb has remained stable since admission.  ??  -Continue cyclosporine 50mg  BID and check trough every morning immediately prior to AM dose. Goal level ~50-100.  -Appreciate ID recs  ??  ??  Please page [phone] for further questions/concerns. Patient seen and discussed with Dr. Piedad Climes.  ??  Katherine Hoehn, MD  Gastroenterology Fellow PGY4  [address] of [place], [place]

## 2018-06-05 NOTE — Unmapped (Signed)
IMMUNOCOMPROMISED HOST INFECTIOUS DISEASE PROGRESS NOTE    Assessment/Plan:     Katherine Norton is a 62 y.o. female    ID Problem List:  #Hx of cryptogenic cirrhosis s/p orthotopic liver transplant (1996)  -Serologies: unknown  -Immunosuppression: cyclosporine BID (goal ~50-100)  ??  #Co-morbiditis: acute on CKD, chronic back pain  ??  #Acute hypoxic respiratory failure, concern for pneumonia - 06/02/18  - 7/28 OSH abnormal chest imaging - diffuse patchy opacities in R lung; pulm edema, R lobar pneumonia, CAP vs. Aspiration  - 7/29 Strep pneumo Ag neg, RVP neg, urine Legionella Ag neg  - 7/29 bd cx NGTD  - 8/1 intubated for worsening respiratory status  - 8/3 urine Histo Ag neg, fungal ab neg (aspergillus, blasto and histo Ab)  - 8/4 BAL WBC 911, RBC 111, cloudy; resp quant cx <1k, Gram stain no orgs seen, DFA pneumocystis neg. No cytologic e/o malignancy.  - 8/10 transferred [facility] OSH [facility] to [facility]  - 06/03/18 bd cx ngtd  - 06/03/18 BAL  ---Fungal stain: +fungal elements present, no AFB seen, pneumocystis DFA neg  ---Cx: OPF  - 06/03/18 CT chest w/o contrast: scattered airspace opacities in L apex, LUL and superior segment of LLL, concerning for pneumonia  - 06/03/18 CT A/P w/o contrast: prior liver transplant, diffuse mesenteric and body wall edema with no loculated fluid collections  - 06/03/18 hypotension s/p bronchoscopy, thought to be related to sedation  - 06/04/18 CMV PCR neg, crypto Ag neg    Recommendations:  Dx:  - f/u EBV PCR in blood  - f/u serum Aspergillus galactomannan  - f/u BAL cx, fungal cx  - f/u fungitell assay  - f/u OSH cx  ??  Tx:  - Continue cefepime and flagyl for empiric coverage  - Continue voriconazole for empiric fungal coverage (fungal elements seen on BAL fungal stain)  - Check voriconazole level on 8/16    The [facility] ID service will continue to follow.  Please page the ID Transplant/Liquid Oncology Fellow consult at [phone] with questions.  Patient discussed with Dr. Verlan Friends.    Jarome Matin, MD  Fellow, [facility] Division of Infectious Diseases       Subjective:   -Per chart review and nursing, was extubated on 8/12 to 6L [place] but increased oxygen requirement to HFNC in the evening and became more hypoxic and lethargy with increased work of breathing and was re-intubated for acute hypoxic respiratory failure. Also had PEA arrest during intubation (required 3 attempts), thought to be due to hypoxia. Achieved ROSC after 1 epi and bicarb, 1 round of CPR about 1 Katherine Norton into compressions.  -Spoke with daughter Katherine Norton) on the phone for possible aspiration and she does not think that patient aspirates. No cough with eating or drinking that she knows of. She was otherwise healthy prior to initial hospitalization on 05/20/18 and even went to a family cook out the day before. Patient has been having recurrent bronchitis recently, and the daughter believes that the patient was recovering from bronchitis at the time of admission. But the patient was never smoker and no history of COPD. No recent travels or sick contact.      Medications:  Current antibiotics:  Cefepime 1g q12h IV   Metronidazole 500mg  q8h NG  Voriconazole 200mg  q12h NG  ??  Previous antibiotics:  Micafungin 100mg  q24h IV??    Current/Prior immunomodulators:  Cyclosporine 50mg  q12h NG  ??  Other medications reviewed.     Other medications reviewed.  Objective:     Vital Signs last 24 hours:  Core Temp:  [36.3 ??C-36.9 ??C] 36.8 ??C  Heart Rate:  [63-123] 102  SpO2 Pulse:  [63-116] 102  Resp:  [12-32] 28  A BP-2: (49-230)/(40-137) 96/65  MAP:  [42 mmHg-170 mmHg] 76 mmHg  FiO2 (%):  [40 %-100 %] 100 %  SpO2:  [51 %-100 %] 100 %    Physical Exam:  Patient Lines/Drains/Airways Status    Active Active Lines, Drains, & Airways     Name:   Placement date:   Placement time:   Site:   Days:    ETT  7.5   06/05/18    0037     less than 1    CVC Triple Lumen 06/03/18 Non-tunneled Right Internal jugular   06/03/18    1503    Internal jugular   1    NG/OG Tube Right nostril 06/02/18    2121    Right nostril   2    Urethral Catheter   06/02/18    2118    ???   2    Peripheral IV 06/02/18 Posterior;Left Forearm   06/02/18    2120    Forearm   2    Arterial Line 06/03/18 Right Radial   06/03/18    0007    Radial   2              GEN:  intubated and sedated  EYES: eyes closed  ENT:ETT in place  CV:RRR and no abnormal heart sounds noted  PULM:on ventilator, coarse breath sounds bilaterally  ZOX:WRUE, diffuse, mild tenderness with palpation, obese  AV:WUJWJ catheter in place  SKIN:no petechiae, ecchymoses or obvious rashes on clothed exam  NEURO:intubated and sedated    Labs:  Lab Results   Component Value Date    WBC 8.2 06/05/2018    WBC 14.8 (H) 06/05/2018    WBC 8.5 06/04/2018    WBC 3.3 (L) 04/05/2018    WBC 4.2 03/09/2018    WBC 7.4 01/12/2018    HGB 8.1 (L) 06/05/2018    HGB 6.3 (L) 06/04/2018    HCT 24.8 (L) 06/05/2018    HCT 30.8 (L) 04/05/2018    PLT 122 (L) 06/05/2018    PLT 163 04/05/2018    NEUTROABS 1.9 04/05/2018    LYMPHSABS 0.9 04/05/2018    EOSABS 0.1 04/05/2018    NA 142 06/05/2018    NA 144 06/05/2018    NA 143 06/04/2018    K 4.5 06/05/2018    K 3.8 06/05/2018    K 3.2 (L) 06/04/2018    BUN 95 (H) 06/05/2018    BUN 30 (H) 04/05/2018    CREATININE 2.97 (H) 06/05/2018    CREATININE 2.98 (H) 06/05/2018    CREATININE 2.90 (H) 06/04/2018    CREATININE 2.32 (H) 04/05/2018    CREATININE 2.23 (H) 03/09/2018    CREATININE 1.96 (H) 01/12/2018    GLU 67 06/05/2018    GLU 115 (H) 04/05/2018    MG 1.9 06/05/2018    MG 2.0 04/05/2018    ALBUMIN 3.0 (L) 06/05/2018    ALBUMIN 3.9 12/04/2014    BILITOT 0.7 06/05/2018    BILITOT 0.4 04/05/2018    AST 23 06/05/2018    AST 14 04/05/2018    ALT 14 (L) 06/05/2018    ALT 6 04/05/2018    ALKPHOS 44 06/05/2018    ALKPHOS 90 04/05/2018    INR 1.1 05/22/2015    ESR 45 (  H) 01/17/2013    CRP <0.5 01/17/2013       Imaging:  Imaging was reviewed in Epic. Pertinent findings include as below.  and Imaging was personally reviewed   CXR 06/05/18  Patchy opacities, likely pulmonary edema. Small bilateral pleural effusion. No pneumothorax    Microbiology:  Please see the problem list above for the pertinent micro

## 2018-06-05 NOTE — Unmapped (Signed)
MICU Progress Note     Date of Service: 06/05/2018    Problem List:       Interval history: Debbra Digiulio is a 62 y.o. female with  PMHx of liver transplant (1999) on immunosuppresion, chronic back pain, anxiety, HTN, CKD,  that presents to [facility] as a transfer from [facility], with respiratory failure and possible contained duodenal perforation.      24hr events: extubated yesterday morning w/ progressive hypoxic respiratory failure requiring re-intubation, which proved difficult apparently. Pt became hypoxic to the 60s and ultimately required very brief round of CPR and 1mg  epi before ROSC achieved. Mental status intact following event. Unable to get in touch with family at this time to update them     Neurological   #acute on chronic pain/sedation  - low dose propofol gtt   - add oxy 10 mg scheduled every 8 hrs    #acute encephalopathy: Resolved-  presented to OSH altered, but also hypoxic at that time. Currently [facility]    Pulmonary   #acute hypoxic respiratory failure likely due to multifocal PNA.  Intubated on 8/1 CT chest with Scattered airspace opacities in the left apex, left upper lobe and superior segment of left lower lobe; concerning for pneumonia.  Currently on PRVC 12/300/60% +8 PEEP with most recent ABG 7.24/46/99/97%.  Will adjust minute ventilation for resp acidosis and wean Fi02.    -sedation as needed for vent compliance.    8/12: Plan SBT once sedation is lowered.  May need diuresis as well. Extubated, followed by progressively worsening respiratory failure  8/13: Re-intubated early morning, weaning vent. Bronch completed, culture and cell count sent.    Cardiovascular   #h/o hypertension: hold home metoprolol and amlodipine as recently off pressor  - prn hydralazine    Echo 7/29: EF 65-70%, mod pulm HTN and borderline dilated RV   - repeat echo pending    Renal   #Acute on chronic renal failure  - Cr baseline appears to be ~1.7--> 3-->2.87  - Was on diuresis regimen at OSH, but was net pos due to enteral feeds  - Trend and may require CRRT or diuresis regimen  - Trial 40mg  lasix today    Infectious Disease/Autoimmune   #septic shock due to ? PNA.  Initially seen at OSH and had c/f perforated duodenal ulcer, but CT scan here non concerning.  CT chest with possible multifocal PNA.    - appreciate ID's assistance.  - Plan to bronch as above    Abx:   Vanco (8/11-->8/12)  Mica (8/11-->8/12)  Flagyl (8/10-->)  Cefepime  (8/10-->)  Voriconazole (8/12-->)    Cx:  8/11: fungal cx: some fungal elements  8/11: LRC: pending  8/10: blood cx pending  8/10: BAL: pending    Abx: OSH  ceftaroline and flagyl  levaquin  meropenem     Cx OSH:  - 7/29 Strep pneumo Ag neg, RVP neg, urine Legionella Ag neg  - 7/29 bd cx NGTD  OSH: Fungal, pneumocystis/histoplasma, AFB, Gram on BAL all were negative.  FEN/GI   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  -  transplant team consult:   - cylcosporin 50 mg bid   - Continue voriconazole    # concern for contained duodenal perf:  06/03/18 CT A/P w/o contrast: prior liver transplant, diffuse mesenteric and body wall edema with no loculated fluid collections.  Per surgery, NO perf.      #abd pain: pt endorsing abd pain.  Will check  lipase.  Start TF if no plans to extubate.     Malnutrition Assessment: Not done yet.         Heme/Coag   Acute blood loss- no s/s of bleeding  - Hgb drop to 5.8 at OSH with transfusion  - cbc stable.    - DVT ppx held due to drop in h/h.  Restarted.      Endocrine   nai    Prophylaxis/LDA/Restraints/Consults   Can CVC be removed? No: need for medications requiring central access (e.g. pressors)   Can A-line be removed? No: frequent ABGs  Can Foley be removed? No: Need continuous I/O  Mobility plan: Step 1 - Range of motion    Feeding: will start if not extubated  Analgesia: Pain adequately controlled  Sedation SAT/SBT: No PEEP > 8  Thrombembolic ppx: SQ heparin  Head of bed: >30 deg  Ulcer ppx: Yes, mechanical ventilation > 3 days, not on tube feeds  Glucose within target range: Yes, in range    RASS at goal? Yes  Rich[place] Agitation Assessment Scale (RASS) : -1 (06/05/2018  6:00 AM)     Can antipsychotics be stopped? N/A, not on antipsychotics  CAM-ICU Result: Positive (06/04/2018  8:00 PM)      Patient Lines/Drains/Airways Status    Active Active Lines, Drains, & Airways     Name:   Placement date:   Placement time:   Site:   Days:    ETT  7.5   06/05/18    0037     less than 1    CVC Triple Lumen 06/03/18 Non-tunneled Right Internal jugular   06/03/18    1503    Internal jugular   1    NG/OG Tube Right nostril   06/02/18    2121    Right nostril   2    Urethral Catheter   06/02/18    2118    ???   2    Peripheral IV 06/02/18 Posterior;Left Forearm   06/02/18    2120    Forearm   2    Arterial Line 06/03/18 Right Radial   06/03/18    0007    Radial   2              Patient Lines/Drains/Airways Status    Active Wounds     None                Goals of Care     Code Status: Full Code    Designated Healthcare Decision Maker:  Ms. Zuba currently lacks decisional capacity for healthcare decision-making and is unable to designate a surrogate healthcare decision maker. Ms. Widjaja designated healthcare decision maker(s) is/are tashauna caisse and [name] (the patient's adult child) as denoted by hospital policy for patients without a known preference.      Subjective   Anyia Jayni Prescher is a 62 y.o. female with PMHx of liver transplant (1999) on immunosuppresion, chronic back pain, anxiety, HTN, CKD,  that presents to [facility] as a transfer from [facility]. Patient initially presented to the OSH on 05/20/18 due to acute worsening shortness of breath and altered mental status. Patient was sating 68% on RA, but 90% on CPAP. Patient had severe acidemia on ABG as well as acute on chronic renal failure with Cr of 3.89. CXR at that time showed consolidation with concern for pneumonia and was febrile. Patient's initial lactate was 2.70. Patient was started on IV ceftaroline and flagyl initially.  Objective     Vitals - past 24 hours  Heart Rate:  [63-123] 102  SpO2 Pulse:  [63-116] 102  Resp:  [12-32] 28  FiO2 (%):  [40 %-100 %] 100 %  SpO2:  [51 %-100 %] 100 % Intake/Output  I/O last 3 completed shifts:  In: 1888.6 [I.V.:42.6; NG/GT:120; IV Piggyback:1726]  Out: 2860 [Urine:2860]     Physical Exam:    General: obese female, awake in NAD  HEENT: PERRLA  CV: RRR, no mrg  Pulm: coarse bs bilaterally  GI: soft, tender diffusely to palpation  MSK: no edema  Skin: intact  Neuro: awake, FC, mae      Continuous Infusions:   ??? propofol 15 mcg/kg/min (06/05/18 0400)       Scheduled Medications:   ??? alteplase  2 mg Intravenous Once   ??? calcitonin (salmon)  1 spray Alternating Nares Daily (RT)   ??? Cefepime  1 g Intravenous Q12H   ??? chlorhexidine  10 mL Mouth BID   ??? cycloSPORINE  50 mg NG tube BID   ??? heparin (porcine) for subcutaneous use  5,000 Units Subcutaneous [facility]   ??? metroNIDAZOLE  500 mg NG tube TID   ??? oxyCODONE  10 mg NG tube [facility]   ??? pantoprazole  40 mg Intravenous BID   ??? sodium bicarbonate  650 mg Oral 4x Daily   ??? voriconazole  200 mg Oral Q12H [facility]       PRN medications:      Data/Imaging Review: Reviewed in Epic and personally interpreted on 06/05/2018. See EMR for detailed results.     .  Critical Care Attestation     This patient is critically ill or injured with the impairment of vital organ systems such that there is a high probability of imminent or life threatening deterioration in the patient's condition. This patient must remain in the ICU for ongoing evaluation of the comprehensive management plan outlined in this note. I directly provided critical care services as documented in this note and the critical care time spent (30 min) is exclusive of separately billable procedures.    Daryel November, ACNP

## 2018-06-06 LAB — CBC
HEMATOCRIT: 24.5 % — ABNORMAL LOW (ref 36.0–46.0)
HEMOGLOBIN: 7.8 g/dL — ABNORMAL LOW (ref 12.0–16.0)
MEAN CORPUSCULAR HEMOGLOBIN CONC: 32 g/dL (ref 31.0–37.0)
MEAN CORPUSCULAR HEMOGLOBIN: 31.1 pg (ref 26.0–34.0)
MEAN CORPUSCULAR VOLUME: 97.1 fL (ref 80.0–100.0)
MEAN PLATELET VOLUME: 12.5 fL — ABNORMAL HIGH (ref 7.0–10.0)
PLATELET COUNT: 75 10*9/L — ABNORMAL LOW (ref 150–440)
RED BLOOD CELL COUNT: 2.52 10*12/L — ABNORMAL LOW (ref 4.00–5.20)
RED CELL DISTRIBUTION WIDTH: 18 % — ABNORMAL HIGH (ref 12.0–15.0)

## 2018-06-06 LAB — BLOOD GAS CRITICAL CARE PANEL, ARTERIAL
BASE EXCESS ARTERIAL: -9.4 — ABNORMAL LOW (ref -2.0–2.0)
CALCIUM IONIZED ARTERIAL (MG/DL): 4.33 mg/dL — ABNORMAL LOW (ref 4.40–5.40)
GLUCOSE WHOLE BLOOD: 52 mg/dL
HCO3 ARTERIAL: 16 mmol/L — ABNORMAL LOW (ref 22–27)
HEMOGLOBIN BLOOD GAS: 6.9 g/dL — ABNORMAL LOW (ref 12.00–16.00)
PCO2 ARTERIAL: 32.1 mmHg — ABNORMAL LOW (ref 35.0–45.0)
PO2 ARTERIAL: 92.1 mmHg (ref 80.0–110.0)
POTASSIUM WHOLE BLOOD: 3.6 mmol/L (ref 3.4–4.6)
SODIUM WHOLE BLOOD: 144 mmol/L (ref 135–145)

## 2018-06-06 LAB — BASIC METABOLIC PANEL
ANION GAP: 20 mmol/L — ABNORMAL HIGH (ref 9–15)
BLOOD UREA NITROGEN: 95 mg/dL — ABNORMAL HIGH (ref 7–21)
BUN / CREAT RATIO: 30
CALCIUM: 8.7 mg/dL (ref 8.5–10.2)
CHLORIDE: 106 mmol/L (ref 98–107)
CO2: 15 mmol/L — ABNORMAL LOW (ref 22.0–30.0)
CREATININE: 3.19 mg/dL — ABNORMAL HIGH (ref 0.60–1.00)
EGFR CKD-EPI NON-AA FEMALE: 15 mL/min/{1.73_m2} — ABNORMAL LOW (ref >=60)
GLUCOSE RANDOM: 54 mg/dL — ABNORMAL LOW (ref 65–179)
POTASSIUM: 4.2 mmol/L (ref 3.5–5.0)
SODIUM: 141 mmol/L (ref 135–145)

## 2018-06-06 LAB — PH ARTERIAL: pH:LsCnc:Pt:BldA:Qn:: 7.31 — ABNORMAL LOW

## 2018-06-06 LAB — COMPREHENSIVE METABOLIC PANEL
ALBUMIN: 2.9 g/dL — ABNORMAL LOW (ref 3.5–5.0)
ALT (SGPT): 16 U/L (ref 15–48)
ANION GAP: 20 mmol/L — ABNORMAL HIGH (ref 9–15)
AST (SGOT): 23 U/L (ref 14–38)
BILIRUBIN TOTAL: 1 mg/dL (ref 0.0–1.2)
BLOOD UREA NITROGEN: 95 mg/dL — ABNORMAL HIGH (ref 7–21)
BUN / CREAT RATIO: 30
CALCIUM: 8.7 mg/dL (ref 8.5–10.2)
CHLORIDE: 106 mmol/L (ref 98–107)
CO2: 15 mmol/L — ABNORMAL LOW (ref 22.0–30.0)
CREATININE: 3.19 mg/dL — ABNORMAL HIGH (ref 0.60–1.00)
EGFR CKD-EPI AA FEMALE: 17 mL/min/{1.73_m2} — ABNORMAL LOW (ref >=60)
EGFR CKD-EPI NON-AA FEMALE: 15 mL/min/{1.73_m2} — ABNORMAL LOW (ref >=60)
GLUCOSE RANDOM: 54 mg/dL — ABNORMAL LOW (ref 65–179)
POTASSIUM: 4.2 mmol/L (ref 3.5–5.0)
PROTEIN TOTAL: 6 g/dL — ABNORMAL LOW (ref 6.5–8.3)
SODIUM: 141 mmol/L (ref 135–145)

## 2018-06-06 LAB — PHOSPHORUS: Phosphate:MCnc:Pt:Ser/Plas:Qn:: 9 — ABNORMAL HIGH

## 2018-06-06 LAB — TRIGLYCERIDES: Triglyceride:MCnc:Pt:Ser/Plas:Qn:: 222 — ABNORMAL HIGH

## 2018-06-06 LAB — CO2: Carbon dioxide:SCnc:Pt:Ser/Plas:Qn:: 15 — ABNORMAL LOW

## 2018-06-06 LAB — MAGNESIUM: Magnesium:MCnc:Pt:Ser/Plas:Qn:: 1.8

## 2018-06-06 LAB — CALCIUM: Calcium:MCnc:Pt:Ser/Plas:Qn:: 8.7

## 2018-06-06 LAB — FUNGITELL: Lab: 74

## 2018-06-06 LAB — CYCLOSPORINE (FPIA) BLOOD: Lab: 110

## 2018-06-06 LAB — PLATELET COUNT: Lab: 75 — ABNORMAL LOW

## 2018-06-06 NOTE — Unmapped (Signed)
IMMUNOCOMPROMISED HOST INFECTIOUS DISEASE PROGRESS NOTE    Assessment/Plan:     Katherine Norton is a 62 y.o. female with PMHx of cryptogenic cirrhosis s/p OLT who presents with     ID Problem List:  #Hx of cryptogenic cirrhosis s/p orthotopic liver transplant (1996)  -Serologies: unknown  -Immunosuppression: cyclosporine BID (goal ~50-100)  ??  #Co-morbiditis: acute on CKD, chronic back pain  ??  #Acute hypoxic respiratory failure, concern for pneumonia - 06/02/18  - 7/28 OSH abnormal chest imaging - diffuse patchy opacities in R lung; pulm edema, R lobar pneumonia, CAP vs. Aspiration  - 8/1 intubated for worsening respiratory status  - 8/4 BAL WBC 911, RBC 111, cloudy; resp quant cx <1k, Gram stain no orgs seen, DFA pneumocystis neg. No cytologic e/o malignancy.  - 8/10 transferred from OSH [facility]) to [facility]  - 06/03/18 bd cx ngtd  - 06/03/18 BAL- Fungal stain: +fungal elements present, no AFB seen, pneumocystis DFA neg -Cx: OPF  - 06/03/18 CT chest w/o contrast: scattered airspace opacities in L apex, LUL and superior segment of LLL, concerning for pneumonia  - 06/03/18 hypotension s/p bronchoscopy, thought to be related to sedation  - 06/04/18 CMV PCR neg, crypto Ag neg  - 06/05/18 Bronch cultures: NGTD, 2+ PMNs, no organism. Bronch without mention of secretions     Recommendations:  - Continue cefepime for empiric coverage  - stop metronidazole   - Continue voriconazole for empiric fungal coverage (fungal elements seen on BAL fungal stain). Will ask micro lab if they can do a fungal smear off cultures from 8/13.  If negative would stop voriconazole   - Check voriconazole level on 8/16    The [facility] ID service will continue to follow.  Please page the ID Transplant/Liquid Oncology Fellow consult at [phone] with questions.  Patient discussed with Dr. Verlan Friends.    Elliot Gault, MD  Fellow, [facility] Division of Infectious Diseases       Subjective:   - Patient unable provide history- intubated and sedated.   - Afebrile. WBC trending down.  - Remains intubated.   - Vent settings slightly improved from yesterday       Medications:  Current antibiotics:  Cefepime 1g q12h IV   Metronidazole 500mg  q8h NG  Voriconazole 200mg  q12h NG  ??  Previous antibiotics:  Micafungin 100mg  q24h IV??    Current/Prior immunomodulators:  Cyclosporine 50mg  q12h NG  ??  Other medications reviewed.     Other medications reviewed.    Objective:     Vital Signs last 24 hours:  Core Temp:  [36.2 ??C (97.2 ??F)-36.6 ??C (97.9 ??F)] 36.3 ??C (97.3 ??F)  Heart Rate:  [90-107] 99  SpO2 Pulse:  [90-107] 98  Resp:  [17-67] 28  A BP-2: (89-148)/(62-87) 115/65  MAP:  [72 mmHg-108 mmHg] 83 mmHg  FiO2 (%):  [40 %-50 %] 40 %  SpO2:  [94 %-100 %] 96 %    Physical Exam:  Patient Lines/Drains/Airways Status    Active Active Lines, Drains, & Airways     Name:   Placement date:   Placement time:   Site:   Days:    ETT  7.5   06/05/18    0037     1    CVC Triple Lumen 06/03/18 Non-tunneled Right Internal jugular   06/03/18    1503    Internal jugular   2    NG/OG Tube Right nostril   06/02/18    2121  Right nostril   3    Urethral Catheter   06/02/18    2118    ???   3    Peripheral IV 06/02/18 Posterior;Left Forearm   06/02/18    2120    Forearm   3    Arterial Line 06/03/18 Right Radial   06/03/18    0007    Radial   3              GEN:  intubated and sedated  EYES: eyes closed  ENT:ETT in place  CV:RRR and no abnormal heart sounds noted  PULM:on ventilator, coarse breath sounds bilaterally  ZHY:QMVH, diffuse, mild tenderness with palpation, obese  QI:ONGEX catheter in place  SKIN:no petechiae, ecchymoses or obvious rashes on clothed exam  NEURO:intubated and sedated    Labs:  Lab Results   Component Value Date    WBC 3.7 (L) 06/06/2018    WBC 8.2 06/05/2018    WBC 14.8 (H) 06/05/2018    WBC 3.3 (L) 04/05/2018    WBC 4.2 03/09/2018    WBC 7.4 01/12/2018    HGB 7.8 (L) 06/06/2018    HGB 6.3 (L) 06/04/2018    HCT 24.5 (L) 06/06/2018    HCT 30.8 (L) 04/05/2018    PLT 75 (L) 06/06/2018 PLT 163 04/05/2018    NEUTROABS 1.9 04/05/2018    LYMPHSABS 0.9 04/05/2018    EOSABS 0.1 04/05/2018    NA 141 06/06/2018    NA 141 06/06/2018    NA 144 06/06/2018    NA 143 06/04/2018    K 4.2 06/06/2018    K 4.2 06/06/2018    K 3.6 06/06/2018    K 3.2 (L) 06/04/2018    BUN 95 (H) 06/06/2018    BUN 95 (H) 06/06/2018    BUN 30 (H) 04/05/2018    CREATININE 3.19 (H) 06/06/2018    CREATININE 3.19 (H) 06/06/2018    CREATININE 2.97 (H) 06/05/2018    CREATININE 2.32 (H) 04/05/2018    CREATININE 2.23 (H) 03/09/2018    CREATININE 1.96 (H) 01/12/2018    GLU 54 (L) 06/06/2018    GLU 54 (L) 06/06/2018    GLU 115 (H) 04/05/2018    MG 1.8 06/06/2018    MG 2.0 04/05/2018    ALBUMIN 2.9 (L) 06/06/2018    ALBUMIN 3.9 12/04/2014    BILITOT 1.0 06/06/2018    BILITOT 0.4 04/05/2018    AST 23 06/06/2018    AST 14 04/05/2018    ALT 16 06/06/2018    ALT 6 04/05/2018    ALKPHOS 45 06/06/2018    ALKPHOS 90 04/05/2018    INR 1.1 05/22/2015    ESR 45 (H) 01/17/2013    CRP <0.5 01/17/2013       Imaging:  No new imaging.       Microbiology:  bronch culture with yeast

## 2018-06-06 NOTE — Unmapped (Signed)
MICU Progress Note     Date of Service: 06/06/2018    Problem List:       Interval history: Katherine Norton is a 62 y.o. female with  PMHx of liver transplant (1999) on immunosuppresion, chronic back pain, anxiety, HTN, CKD,  that presents to [facility] as a transfer from [facility], with respiratory failure and possible contained duodenal perforation.      24hr events: extubated yesterday morning w/ progressive hypoxic respiratory failure requiring re-intubation, which proved difficult apparently. Pt became hypoxic to the 60s and ultimately required very brief round of CPR and 1mg  epi before ROSC achieved. Mental status intact following event. Unable to get in touch with family at this time to update them     Neurological   #acute on chronic pain/sedation  - low dose propofol gtt   - add oxy 10 mg scheduled every 8 hrs    #acute encephalopathy: Resolved-  presented to OSH altered, but also hypoxic at that time. Currently [facility]    Pulmonary   #acute hypoxic respiratory failure likely due to multifocal PNA.  Intubated on 8/1 CT chest with Scattered airspace opacities in the left apex, left upper lobe and superior segment of left lower lobe; concerning for pneumonia.  Currently on PRVC 12/300/60% +8 PEEP with most recent ABG 7.24/46/99/97%.  Will adjust minute ventilation for resp acidosis and wean Fi02.    -sedation as needed for vent compliance.    8/12: Plan SBT once sedation is lowered.  May need diuresis as well. Extubated, followed by progressively worsening respiratory failure  8/13: Re-intubated early morning, weaning vent. Bronch completed, culture and cell count sent.  8/14: Will place esophogeal balloon to figure out optimal PEEP    Cardiovascular   #h/o hypertension: hold home metoprolol and amlodipine as recently off pressor  - prn hydralazine    Echo 7/29: EF 65-70%, mod pulm HTN and borderline dilated RV   - repeat echo still pending    Renal   #Acute on chronic renal failure  - Cr baseline appears to be ~1.7--> 3-->2.87  - Was on diuresis regimen at OSH, but was net pos due to enteral feeds  - Trend and may require CRRT or diuresis regimen  - Trial of 40mg  lasix 8/13 w/ good output but decent bump in Cr. Will hold further diuresis today    Infectious Disease/Autoimmune   #septic shock due to ? PNA.  Initially seen at OSH and had c/f perforated duodenal ulcer, but CT scan here non concerning.  CT chest with possible multifocal PNA.    - appreciate ID's assistance.  - Plan to bronch as above    Abx:   Vanco (8/11-->8/12)  Mica (8/11-->8/12)  Flagyl (8/10-->)  Cefepime  (8/10-->)  Voriconazole (8/12-->)    Cx:  8/11: fungal cx: some fungal elements  8/11: LRC: pending  8/10: blood cx pending  8/10: BAL: pending    Abx: OSH  ceftaroline and flagyl  levaquin  meropenem     Cx OSH:  - 7/29 Strep pneumo Ag neg, RVP neg, urine Legionella Ag neg  - 7/29 bd cx NGTD  OSH: Fungal, pneumocystis/histoplasma, AFB, Gram on BAL all were negative.  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  Per surgery, NO perf.      #abd pain: pt endorsing abd pain.  Will check lipase.  Start TF if no plans to extubate.     Malnutrition Assessment: Not done yet.   - Restart TF today       Heme/Coag   Acute blood loss- no s/s of bleeding  - Hgb drop to 5.8 at OSH with transfusion  - cbc stable.    - DVT ppx held due to drop in h/h.  Restarted.      Endocrine   nai    Prophylaxis/LDA/Restraints/Consults   Can CVC be removed? No: need for medications requiring central access (e.g. pressors)   Can A-line be removed? No: frequent ABGs  Can Foley be removed? No: Need continuous I/O  Mobility plan: Step 1 - Range of motion    Feeding: Trickle feeds, advance as tolerated Analgesia: Pain adequately controlled  Sedation SAT/SBT: No PEEP > 8  Thrombembolic ppx: SQ heparin  Head of bed: >30 deg  Ulcer ppx: Yes, mechanical ventilation > 3 days, not on tube feeds  Glucose within target range: Yes, in range    RASS at goal? Yes  Rich[place] Agitation Assessment Scale (RASS) : -1 (06/06/2018  6:00 AM)     Can antipsychotics be stopped? N/A, not on antipsychotics  CAM-ICU Result: Positive (06/05/2018  8:00 AM)      Patient Lines/Drains/Airways Status    Active Active Lines, Drains, & Airways     Name:   Placement date:   Placement time:   Site:   Days:    ETT  7.5   06/05/18    0037     1    CVC Triple Lumen 06/03/18 Non-tunneled Right Internal jugular   06/03/18    1503    Internal jugular   2    NG/OG Tube Right nostril   06/02/18    2121    Right nostril   3    Urethral Catheter   06/02/18    2118    ???   3    Peripheral IV 06/02/18 Posterior;Left Forearm   06/02/18    2120    Forearm   3    Arterial Line 06/03/18 Right Radial   06/03/18    0007    Radial   3              Patient Lines/Drains/Airways Status    Active Wounds     None                Goals of Care     Code Status: Full Code    Designated Healthcare Decision Maker:  Ms. Malachi currently lacks decisional capacity for healthcare decision-making and is unable to designate a surrogate healthcare decision maker. Ms. Gressman designated healthcare decision maker(s) is/are Katherine Norton and Katherine Norton (the patient's adult child) as denoted by hospital policy for patients without a known preference.      Subjective   Katherine Norton is a 62 y.o. female with PMHx of liver transplant (1999) on immunosuppresion, chronic back pain, anxiety, HTN, CKD,  that presents to [facility] as a transfer from [facility]. Patient initially presented to the OSH on 05/20/18 due to acute worsening shortness of breath and altered mental status. Patient was sating 68% on RA, but 90% on CPAP. Patient had severe acidemia on ABG as well as acute on chronic renal failure with Cr of 3.89.  CXR at that time showed consolidation with concern for pneumonia and was febrile. Patient's initial lactate was 2.70. Patient was started on IV ceftaroline and flagyl initially.      Objective     Vitals - past 24 hours  Heart Rate:  [90-107] 93  SpO2 Pulse:  [90-107] 101  Resp:  [17-59] 28  FiO2 (%):  [50 %-100 %] 50 %  SpO2:  [94 %-100 %] 94 % Intake/Output  I/O last 3 completed shifts:  In: 920.6 [I.V.:350.1; NG/GT:140; IV Piggyback:430.5]  Out: 2645 [Urine:2545; Stool:100]     Physical Exam:    General: obese female, awake in NAD  HEENT: PERRLA  CV: RRR, no mrg  Pulm: coarse bs bilaterally  GI: soft, tender diffusely to palpation  MSK: no edema  Skin: intact  Neuro: awake, FC, mae      Continuous Infusions:   ??? propofol 20 mcg/kg/min (06/06/18 0001)       Scheduled Medications:   ??? calcitonin (salmon)  1 spray Alternating Nares Daily (RT)   ??? Cefepime  1 g Intravenous Q12H   ??? chlorhexidine  5 mL Mouth BID   ??? cycloSPORINE  50 mg NG tube BID   ??? esomeprazole  40 mg NG tube daily   ??? heparin (porcine) for subcutaneous use  5,000 Units Subcutaneous [facility]   ??? metroNIDAZOLE  500 mg NG tube TID   ??? oxyCODONE  10 mg NG tube [facility]   ??? sodium bicarbonate  650 mg NG tube 4x Daily   ??? voriconazole  200 mg NG tube Q12H [facility]       PRN medications:      Data/Imaging Review: Reviewed in Epic and personally interpreted on 06/06/2018. See EMR for detailed results.     .  Critical Care Attestation     This patient is critically ill or injured with the impairment of vital organ systems such that there is a high probability of imminent or life threatening deterioration in the patient's condition. This patient must remain in the ICU for ongoing evaluation of the comprehensive management plan outlined in this note. I directly provided critical care services as documented in this note and the critical care time spent (0 min) is exclusive of separately billable procedures.    Daryel November, ACNP

## 2018-06-06 NOTE — Unmapped (Signed)
Adult Nutrition Consult     Visit Type: MD Consult  Reason for Visit:  Assessment and Enteral Nutrition     ASSESSMENT:   HPI & PMH: Per MD note, Katherine Norton is a 62 y.o. female with ??PMHx of liver transplant (1999) on immunosuppresion, chronic back pain, anxiety, HTN, CKD,????that presents to [facility] as a transfer from [facility], with respiratory failure and possible contained duodenal perforation.   Nutrition Hx:  Obtained per chart review as pt intubated and sedated and provider in rounding on patient at time of my visit. Per RN, pt edematous and s/p diuresis.  Nutritionally Pertinent Meds: Reviewed and include Propofol at 8.17ml/hr  Labs:   Lab Results   Component Value Date    NA 142 06/06/2018    K 3.7 06/06/2018    CL 106 06/06/2018    CL 106 06/06/2018    CO2 15.0 (L) 06/06/2018    CO2 15.0 (L) 06/06/2018    BUN 95 (H) 06/06/2018    BUN 95 (H) 06/06/2018    CREATININE 3.19 (H) 06/06/2018    CREATININE 3.19 (H) 06/06/2018    GFR 53.95 (L) 01/17/2013    GLU 54 (L) 06/06/2018    GLU 54 (L) 06/06/2018    CALCIUM 8.7 06/06/2018    CALCIUM 8.7 06/06/2018    ALBUMIN 2.9 (L) 06/06/2018    PHOS 9.0 (H) 06/06/2018     Recent Labs   Lab Units 06/06/18  0304   MAGNESIUM mg/dL 1.8        Abd/GI: Per above-perf ruled out per surgery.  NGT in place with tip located in the stomach per xray   Skin:   Patient Lines/Drains/Airways Status    Active Wounds     None               Current nutrition therapy order:   Nutrition Orders          Adult Enteral Nutrition Osmolite 1.0 (Standard Isotonic) starting at 08/14 0800    NPO No Exceptions; Medically necessary starting at 08/13 0106           Anthropometric Data:  -- Height: 152.4 cm (5')   -- Admission weight: 92kg-likely inaccurate and reflective of fluid  -- IBW: 45.45 kg  -- BMI: 31   -- Weight changes this admission:   Last 5 Recorded Weights    06/02/18 2200   Weight: 92.5 kg (203 lb 14.8 oz)      -- Weight history PTA: unclear at this time-weight gain noted and likely fluid  Wt Readings from Last 10 Encounters:   06/02/18 92.5 kg (203 lb 14.8 oz)   01/15/18 73.2 kg (161 lb 6.4 oz)   12/26/16 72.7 kg (160 lb 4.8 oz)   04/01/16 71.2 kg (157 lb)   03/24/16 70.8 kg (156 lb)   02/18/16 69.9 kg (154 lb)   12/11/15 68.5 kg (151 lb)   12/07/15 69.7 kg (153 lb 11.2 oz)   10/14/15 68.3 kg (150 lb 9.6 oz)   08/12/15 69.7 kg (153 lb 9.6 oz)        Daily Estimated Nutrient Needs: using last weight from March as seems most accurate     Energy: 1610-9604 kcals 22-25 kcal/kg using estimated dry weight, 73 kg (06/06/18 1546)]  Protein: 85-110 gm [1.2-1.5 gm/kg using estimated dry weight, 73 kg (06/06/18 1546)]  Carbohydrate:   [no restriction]  Fluid:   mL [per MD team]    Nutrition Focused Physical Exam:  To be completed pending pt availaibility         DIAGNOSIS:  Malnutrition Assessment using AND/ASPEN Clinical Characteristics:     To be completed pending pt/family ability to provide nutrition/weight hx as well as physical exam         Overall nutrition impression:     Agree with enteral nutrition support until able to start oral diet. Recommendations are below for  Nepro TF formula which is appropriate given renal insufficiency and volume overload    Propofol at current rate of 8.75ml/hr will not be factored into calorie calculations from tube feeds as not currently considered a significant calorie source     GOALS:  Enteral Nutrition:       - Patient to reach goal TF rate within 3 days of initiation.  - Patient to meet 60% of TF goal in the first week of ICU stay.  Anthropometric:       - Goal for purposeful weight loss via diuresis  Laboratory Data:       - Electrolyte profile will be within provider-set parameters.  - Blood Glucose and/or A1C values trend towards normal limits.  - Triglyceride levels to be measured and < 400-500 mg/dL while on propofol infusion.         RECOMMENDATIONS, INTERVENTIONS, AND GOALS:   1. For tube feeds, suggest Nepro  with goal rate of 51ml/hr. In , this will provide 1700kcal, 80g protein, free water, and to meet 100% USRDI for vitamins and minerals.  2. Start tube feed at 10-69ml/hr and increase by 20ml q4-8hrs as tolerated to goal.  3. FWF per team  4. Do not hold tube feeds for residuals unless residuals are greater than  5. Please weigh patient at least 2x/wk  6. Further assessment and recommendations to follow pending pt/family availability to obtain nutrition/weight hx as well as nutrition focused physical exam        RD Follow Up Parameters:  1-2 times per week (and more frequent as indicated)    Griffith Citron, MS, RD, LDN, CNSC

## 2018-06-06 NOTE — Unmapped (Signed)
Remains on vent, stable, no resp distress. ETT secure. Patient bronched today and tol well. Continue to monitor.      Problem: Communication Impairment (Mechanical Ventilation, Invasive)  Goal: Effective Communication  Outcome: Ongoing - Unchanged     Problem: Device-Related Complication Risk (Mechanical Ventilation, Invasive)  Goal: Optimal Device Function  Outcome: Ongoing - Unchanged     Problem: Inability to Wean (Mechanical Ventilation, Invasive)  Goal: Mechanical Ventilation Liberation  Outcome: Ongoing - Unchanged     Problem: Skin and Tissue Injury (Mechanical Ventilation, Invasive)  Goal: Absence of Device-Related Skin and Tissue Injury  Outcome: Ongoing - Unchanged     Problem: Ventilator-Induced Lung Injury (Mechanical Ventilation, Invasive)  Goal: Absence of Ventilator-Induced Lung Injury  Outcome: Ongoing - Unchanged

## 2018-06-06 NOTE — Unmapped (Signed)
Pt calm, following commands but slightly sedated on prop. VSS, remains off pressors. NSR to low ST. Titrated down to 50% FiO2 PRVC.  Bronched at bedside. Turned q2. NG remains clamped. Foley with decent urine output, minimal increase in UO with 40IV lasix. Family at bedside, updated on POC.   Problem: Adult Inpatient Plan of Care  Goal: Plan of Care Review  Outcome: Ongoing - Unchanged  Goal: Patient-Specific Goal (Individualization)  Outcome: Ongoing - Unchanged  Goal: Absence of Hospital-Acquired Illness or Injury  Outcome: Ongoing - Unchanged  Goal: Optimal Comfort and Wellbeing  Outcome: Ongoing - Unchanged  Goal: Readiness for Transition of Care  Outcome: Ongoing - Unchanged  Goal: Rounds/Family Conference  Outcome: Ongoing - Unchanged     Problem: Latex Allergy  Goal: Absence of Allergy Symptoms  Outcome: Ongoing - Unchanged     Problem: Skin Injury Risk Increased  Goal: Skin Health and Integrity  Outcome: Ongoing - Unchanged     Problem: Fall Injury Risk  Goal: Absence of Fall and Fall-Related Injury  Outcome: Ongoing - Unchanged     Problem: Communication Impairment (Mechanical Ventilation, Invasive)  Goal: Effective Communication  Outcome: Ongoing - Unchanged     Problem: Device-Related Complication Risk (Mechanical Ventilation, Invasive)  Goal: Optimal Device Function  Outcome: Ongoing - Unchanged     Problem: Inability to Wean (Mechanical Ventilation, Invasive)  Goal: Mechanical Ventilation Liberation  Outcome: Ongoing - Unchanged     Problem: Nutrition Impairment (Mechanical Ventilation, Invasive)  Goal: Optimal Nutrition Delivery  Outcome: Ongoing - Unchanged     Problem: Skin and Tissue Injury (Mechanical Ventilation, Invasive)  Goal: Absence of Device-Related Skin and Tissue Injury  Outcome: Ongoing - Unchanged     Problem: Ventilator-Induced Lung Injury (Mechanical Ventilation, Invasive)  Goal: Absence of Ventilator-Induced Lung Injury  Outcome: Ongoing - Unchanged     Problem: Self-Care Deficit Goal: Improved Ability to Complete Activities of Daily Living  Outcome: Ongoing - Unchanged     Problem: Hypertension Comorbidity  Goal: Blood Pressure in Desired Range  Outcome: Ongoing - Unchanged     Problem: Pain Chronic (Persistent) (Comorbidity Management)  Goal: Acceptable Pain Control and Functional Ability  Outcome: Ongoing - Unchanged     Problem: Fluid Imbalance (Pneumonia)  Goal: Fluid Balance  Outcome: Ongoing - Unchanged     Problem: Infection (Pneumonia)  Goal: Resolution of Infection Signs/Symptoms  Outcome: Ongoing - Unchanged     Problem: Respiratory Compromise (Pneumonia)  Goal: Effective Oxygenation and Ventilation  Outcome: Ongoing - Unchanged

## 2018-06-07 DIAGNOSIS — J9601 Acute respiratory failure with hypoxia: Principal | ICD-10-CM

## 2018-06-07 LAB — COMPREHENSIVE METABOLIC PANEL
ALBUMIN: 3 g/dL — ABNORMAL LOW (ref 3.5–5.0)
ALKALINE PHOSPHATASE: 55 U/L (ref 38–126)
ALT (SGPT): 15 U/L (ref 15–48)
ANION GAP: 16 mmol/L — ABNORMAL HIGH (ref 9–15)
AST (SGOT): 28 U/L (ref 14–38)
BILIRUBIN TOTAL: 1.4 mg/dL — ABNORMAL HIGH (ref 0.0–1.2)
BLOOD UREA NITROGEN: 92 mg/dL — ABNORMAL HIGH (ref 7–21)
BUN / CREAT RATIO: 27
CALCIUM: 8.6 mg/dL (ref 8.5–10.2)
CHLORIDE: 111 mmol/L — ABNORMAL HIGH (ref 98–107)
CO2: 15 mmol/L — ABNORMAL LOW (ref 22.0–30.0)
CREATININE: 3.36 mg/dL — ABNORMAL HIGH (ref 0.60–1.00)
EGFR CKD-EPI AA FEMALE: 16 mL/min/{1.73_m2} — ABNORMAL LOW (ref >=60)
GLUCOSE RANDOM: 95 mg/dL (ref 65–179)
POTASSIUM: 4 mmol/L (ref 3.5–5.0)
PROTEIN TOTAL: 6.5 g/dL (ref 6.5–8.3)
SODIUM: 142 mmol/L (ref 135–145)

## 2018-06-07 LAB — URINALYSIS
BILIRUBIN UA: NEGATIVE
GLUCOSE UA: NEGATIVE
KETONES UA: 20 — AB
NITRITE UA: NEGATIVE
PH UA: 5.5 (ref 5.0–9.0)
PROTEIN UA: 30 — AB
RBC UA: 1 /HPF (ref <=4)
SPECIFIC GRAVITY UA: 1.015 (ref 1.003–1.030)
SQUAMOUS EPITHELIAL: <1 /HPF (ref 0–5)
UROBILINOGEN UA: 0.2
WBC UA: 1 /HPF (ref 0–5)

## 2018-06-07 LAB — CALCIUM: Calcium:MCnc:Pt:Ser/Plas:Qn:: 8.6

## 2018-06-07 LAB — CBC
HEMATOCRIT: 26.8 % — ABNORMAL LOW (ref 36.0–46.0)
HEMOGLOBIN: 8.4 g/dL — ABNORMAL LOW (ref 12.0–16.0)
MEAN CORPUSCULAR HEMOGLOBIN: 30.5 pg (ref 26.0–34.0)
MEAN CORPUSCULAR VOLUME: 97.5 fL (ref 80.0–100.0)
MEAN PLATELET VOLUME: 9.9 fL (ref 7.0–10.0)
PLATELET COUNT: 120 10*9/L — ABNORMAL LOW (ref 150–440)
RED BLOOD CELL COUNT: 2.75 10*12/L — ABNORMAL LOW (ref 4.00–5.20)
RED CELL DISTRIBUTION WIDTH: 18.3 % — ABNORMAL HIGH (ref 12.0–15.0)
WBC ADJUSTED: 5.9 10*9/L (ref 4.5–11.0)

## 2018-06-07 LAB — PHOSPHORUS: Phosphate:MCnc:Pt:Ser/Plas:Qn:: 8.5 — ABNORMAL HIGH

## 2018-06-07 LAB — PROTEIN UA: Protein:MCnc:Pt:Urine:Qn:Test strip: 30 — AB

## 2018-06-07 LAB — PLATELET COUNT: Lab: 120 — ABNORMAL LOW

## 2018-06-07 LAB — MAGNESIUM: Magnesium:MCnc:Pt:Ser/Plas:Qn:: 1.9

## 2018-06-07 LAB — CYCLOSPORINE (FPIA) BLOOD: Lab: 105

## 2018-06-07 NOTE — Unmapped (Signed)
Patient Summary: Patient failed SAT. During SAT patient 30-40's and dyssynchronous with the ventilator. Dr. Krista Blue at patient bedside to assess. Patient started on norepinephrine and propofol was increased. See MAR and flowsheet for events. Patient repositioned for comfort.  All lines and tubes remain in place. Will continue to monitor patient.

## 2018-06-07 NOTE — Unmapped (Signed)
MICU Progress Note     Date of Service: 06/07/2018    Problem List:       Interval history: Katherine Norton is a 62 y.o. female with  PMHx of liver transplant (1999) on immunosuppresion, chronic back pain, anxiety, HTN, CKD,  that presents to [facility] as a transfer from [facility], with respiratory failure and possible contained duodenal perforation.      24hr events: Increased agitation this morning, w/ a new pressor requirement, sinus tach.     Neurological   #acute on chronic pain/sedation  - low dose propofol gtt   - add oxy 10 mg scheduled every 8 hrs    #acute encephalopathy: Resolved-  presented to OSH altered, but also hypoxic at that time. Currently [facility]    Pulmonary   #acute hypoxic respiratory failure likely due to multifocal PNA.  Intubated on 8/1 CT chest with Scattered airspace opacities in the left apex, left upper lobe and superior segment of left lower lobe; concerning for pneumonia.  Currently on PRVC 12/300/60% +8 PEEP with most recent ABG 7.24/46/99/97%.  Will adjust minute ventilation for resp acidosis and wean Fi02.    -sedation as needed for vent compliance.    8/12: Plan SBT once sedation is lowered.  May need diuresis as well. Extubated, followed by progressively worsening respiratory failure  8/13: Re-intubated early morning, weaning vent. Bronch completed, culture and cell count sent.  8/14: Will place esophogeal balloon to figure out optimal PEEP. Optimal PEEP found to be 14    Cardiovascular   #h/o hypertension: hold home metoprolol and amlodipine as recently off pressor  - prn hydralazine    Echo 7/29: EF 65-70%, mod pulm HTN and borderline dilated RV   - repeat echo still pending    #Hypotension:  - developed pressor requirement 8/15    Renal   #Acute on chronic renal failure  - Cr baseline appears to be ~1.7--> 3-->2.87  - Was on diuresis regimen at OSH, but was net pos due to enteral feeds  - Trend and may require CRRT or diuresis regimen  - Trial of 40mg  lasix 8/13 w/ good output but decent bump in Cr. Will hold further diuresis today  - Cr continues to rise    Infectious Disease/Autoimmune   #septic shock due to ? PNA.  Initially seen at OSH and had c/f perforated duodenal ulcer, but CT scan here non concerning.  CT chest with possible multifocal PNA.    - appreciate ID's assistance.  - S/p bronch 8/13 w/ no growth  - UA, blood cultures and cxr ordered for new pressor requirement.    Abx:   Vanco (8/11-->8/12)  Mica (8/11-->8/12)  Flagyl (8/10-->8/14)  Cefepime  (8/10-->)  Voriconazole (8/12-->8/15)  Linezolid (8/15- )    Cx:  8/11: fungal neg  8/11: LRC: pending  8/10: blood cx pending  8/10: BAL: pending    Abx: OSH  ceftaroline and flagyl  levaquin  meropenem     Cx OSH:  - 7/29 Strep pneumo Ag neg, RVP neg, urine Legionella Ag neg  - 7/29 bd cx NGTD  OSH: Fungal, pneumocystis/histoplasma, AFB, Gram on BAL all were negative.  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  Per surgery, NO perf.      #abd pain: pt endorsing abd pain.  Will check lipase.  Start TF if no plans to extubate.     Malnutrition Assessment: Not done yet.   - Restart TF today       Heme/Coag   Acute blood loss- no s/s of bleeding  - Hgb drop to 5.8 at OSH with transfusion  - cbc stable.    - DVT ppx held due to drop in h/h.  Restarted.      Endocrine   nai    Prophylaxis/LDA/Restraints/Consults   Can CVC be removed? No: need for medications requiring central access (e.g. pressors)   Can A-line be removed? No: frequent ABGs  Can Foley be removed? No: Need continuous I/O  Mobility plan: Step 1 - Range of motion    Feeding: Trickle feeds, advance as tolerated  Analgesia: Pain adequately controlled  Sedation SAT/SBT: No PEEP > 8  Thrombembolic ppx: SQ heparin  Head of bed: >30 deg  Ulcer ppx: Yes, mechanical ventilation > 3 days, not on tube feeds  Glucose within target range: Yes, in range    RASS at goal? Yes  [place] Agitation Assessment Scale (RASS) : -1 (06/07/2018  7:45 AM)     Can antipsychotics be stopped? N/A, not on antipsychotics  CAM-ICU Result: Positive (06/06/2018  8:00 AM)      Patient Lines/Drains/Airways Status    Active Active Lines, Drains, & Airways     Name:   Placement date:   Placement time:   Site:   Days:    ETT  7.5   06/05/18    0037     2    CVC Triple Lumen 06/03/18 Non-tunneled Right Internal jugular   06/03/18    1503    Internal jugular   3    NG/OG Tube Right nostril   06/02/18    2121    Right nostril   4    Urethral Catheter   06/02/18    2118    ???   4    Peripheral IV 06/02/18 Posterior;Left Forearm   06/02/18    2120    Forearm   4    Arterial Line 06/03/18 Right Radial   06/03/18    0007    Radial   4              Patient Lines/Drains/Airways Status    Active Wounds     None                Goals of Care     Code Status: Full Code    Designated Healthcare Decision Maker:  [name] currently lacks decisional capacity for healthcare decision-making and is unable to designate a surrogate healthcare decision maker. [name] designated healthcare decision maker(s) is/are milta croson and [name] (the patient's adult child) as denoted by hospital policy for patients without a known preference.      Subjective   Katherine Norton is a 62 y.o. female with PMHx of liver transplant (1999) on immunosuppresion, chronic back pain, anxiety, HTN, CKD,  that presents to [facility] as a transfer from [facility]. Patient initially presented to the OSH on 05/20/18 due to acute worsening shortness of breath and altered mental status. Patient was sating 68% on RA, but 90% on CPAP. Patient had severe acidemia on ABG as well as acute on chronic renal failure with Cr of 3.89. CXR at that time  showed consolidation with concern for pneumonia and was febrile. Patient's initial lactate was 2.70. Patient was started on IV ceftaroline and flagyl initially.      Objective     Vitals - past 24 hours  Temp:  [36.7 ??C (98 ??F)] 36.7 ??C (98 ??F)  Heart Rate:  [92-133] 123  SpO2 Pulse:  [92-133] 123  Resp:  [24-46] 25  FiO2 (%):  [30 %-50 %] 40 %  SpO2:  [94 %-99 %] 98 % Intake/Output  I/O last 3 completed shifts:  In: 1657 [I.V.:627; NG/GT:730; IV Piggyback:300]  Out: 1975 [Urine:1875; Stool:100]     Physical Exam:    General: obese female, awake in NAD  HEENT: PERRLA  CV: RRR, no mrg  Pulm: coarse bs bilaterally  GI: soft, tender diffusely to palpation  MSK: no edema  Skin: intact  Neuro: awake, FC, mae      Continuous Infusions:   ??? norepinephrine bitartrate-NS 10.027 mcg/min (06/07/18 0745)   ??? propofol 20 mcg/kg/min (06/07/18 0745)       Scheduled Medications:   ??? calcitonin (salmon)  1 spray Alternating Nares Daily (RT)   ??? Cefepime  1 g Intravenous Q12H   ??? chlorhexidine  5 mL [place] BID   ??? cycloSPORINE  50 mg NG tube BID   ??? esomeprazole  40 mg NG tube daily   ??? heparin (porcine) for subcutaneous use  5,000 Units Subcutaneous [facility]   ??? oxyCODONE  10 mg NG tube [facility]   ??? sodium bicarbonate  650 mg NG tube 4x Daily   ??? voriconazole  200 mg NG tube Q12H [facility]       PRN medications:      Data/Imaging Review: Reviewed in Epic and personally interpreted on 06/07/2018. See EMR for detailed results.     .  Critical Care Attestation     This patient is critically ill or injured with the impairment of vital organ systems such that there is a high probability of imminent or life threatening deterioration in the patient's condition. This patient must remain in the ICU for ongoing evaluation of the comprehensive management plan outlined in this note. I directly provided critical care services as documented in this note and the critical care time spent (30 min) is exclusive of separately billable procedures.    Daryel November, ACNP

## 2018-06-07 NOTE — Unmapped (Signed)
IMMUNOCOMPROMISED HOST INFECTIOUS DISEASE PROGRESS NOTE    Assessment/Plan:     Katherine Norton is a 62 y.o. female with PMHx of cryptogenic cirrhosis s/p OLT who presents with acute respiratory failure.     ID Problem List:  #Hx of cryptogenic cirrhosis s/p orthotopic liver transplant (1996)  -Serologies: unknown  -Immunosuppression: cyclosporine BID (goal ~50-100)  ??  #Co-morbiditis: acute on CKD, chronic back pain  ??  #Acute hypoxic respiratory failure, concern for pneumonia - 06/02/18  - 7/28 OSH abnormal chest imaging - diffuse patchy opacities in R lung; pulm edema, R lobar pneumonia, CAP vs. Aspiration  - 8/1 intubated for worsening respiratory status  - 8/4 BAL WBC 911, RBC 111, cloudy; resp quant cx <1k, Gram stain no orgs seen, DFA pneumocystis neg. No cytologic e/o malignancy.  - 8/10 transferred from OSH [facility]) to [facility]  - 06/03/18 bd cx ngtd  - 06/03/18 BAL- Fungal stain: +fungal elements present, no AFB seen, pneumocystis DFA neg -Cx: OPF  - 06/03/18 CT chest w/o contrast: scattered airspace opacities in L apex, LUL and superior segment of LLL, concerning for pneumonia  - 06/03/18 hypotension s/p bronchoscopy, thought to be related to sedation  - 06/04/18 CMV PCR neg, crypto Ag neg  - 06/05/18 Bronch cultures: NGTD, 2+ PMNs, no organism. Bronch without mention of secretions     Recommendations:  - Continue cefepime for empiric coverage  - would start linezolid 600 mg bID for MRSA coverage given new pressor requirements. If cultures remain negative would anticipate stopping in the next 48 hours   - Fungal smear is negative would stop voriconazole     The [facility] ID service will continue to follow.  Please page the ID Transplant/Liquid Oncology Fellow consult at [id number] with questions.  Patient discussed with Dr. Verlan Friends.    Elliot Gault, MD  Fellow, [facility] Division of Infectious Diseases       Subjective:   - Remains intubated and sedated. Unable to provide history  - Spoke with nursing and team for history. More agitation this morning. Increased pressor requirement.   - WBC stable  - remains on pressors  - remains on ventilator     Medications:  Current antibiotics:  Cefepime 1g q12h IV   Metronidazole 500mg  q8h NG  Voriconazole 200mg  q12h NG  ??  Previous antibiotics:  Micafungin 100mg  q24h IV??    Current/Prior immunomodulators:  Cyclosporine 50mg  q12h NG    Other medications reviewed.    Objective:     Vital Signs last 24 hours:  Temp:  [36.7 ??C (98 ??F)] 36.7 ??C (98 ??F)  Core Temp:  [36.3 ??C (97.3 ??F)-37.6 ??C (99.7 ??F)] 37.6 ??C (99.7 ??F)  Heart Rate:  [92-133] 112  SpO2 Pulse:  [93-133] 112  Resp:  [24-46] 25  A BP-2: (81-139)/(53-84) 127/71  MAP:  [63 mmHg-101 mmHg] 88 mmHg  FiO2 (%):  [30 %-50 %] 40 %  SpO2:  [94 %-100 %] 100 %    Physical Exam:  Patient Lines/Drains/Airways Status    Active Active Lines, Drains, & Airways     Name:   Placement date:   Placement time:   Site:   Days:    ETT  7.5   06/05/18    0037     2    CVC Triple Lumen 06/03/18 Non-tunneled Right Internal jugular   06/03/18    1503    Internal jugular   3    NG/OG Tube Right nostril   06/02/18  2121    Right nostril   4    Urethral Catheter   06/02/18    2118    ???   4    Peripheral IV 06/02/18 Posterior;Left Forearm   06/02/18    2120    Forearm   4    Arterial Line 06/03/18 Right Radial   06/03/18    0007    Radial   4              GEN:  intubated and sedated  EYES: eyes closed  ENT:ETT in place  CV:RRR and no abnormal heart sounds noted  PULM:on ventilator, coarse breath sounds bilaterally  VQQ:VZDG, diffuse, mild tenderness with palpation, obese  LO:VFIEP catheter in place  SKIN:no petechiae, ecchymoses or obvious rashes on clothed exam  NEURO:intubated and sedated    Labs:  Lab Results   Component Value Date    WBC 5.9 06/07/2018    WBC 3.7 (L) 06/06/2018    WBC 8.2 06/05/2018    WBC 3.3 (L) 04/05/2018    WBC 4.2 03/09/2018    WBC 7.4 01/12/2018    HGB 8.4 (L) 06/07/2018    HGB 7.2 (L) 06/06/2018    HCT 26.8 (L) 06/07/2018    HCT 30.8 (L) 04/05/2018    PLT 120 (L) 06/07/2018    PLT 163 04/05/2018    NEUTROABS 1.9 04/05/2018    LYMPHSABS 0.9 04/05/2018    EOSABS 0.1 04/05/2018    NA 142 06/07/2018    NA 142 06/06/2018    K 4.0 06/07/2018    K 3.7 06/06/2018    BUN 92 (H) 06/07/2018    BUN 30 (H) 04/05/2018    CREATININE 3.36 (H) 06/07/2018    CREATININE 3.19 (H) 06/06/2018    CREATININE 3.19 (H) 06/06/2018    CREATININE 2.32 (H) 04/05/2018    CREATININE 2.23 (H) 03/09/2018    CREATININE 1.96 (H) 01/12/2018    GLU 95 06/07/2018    GLU 115 (H) 04/05/2018    MG 1.9 06/07/2018    MG 2.0 04/05/2018    ALBUMIN 3.0 (L) 06/07/2018    ALBUMIN 3.9 12/04/2014    BILITOT 1.4 (H) 06/07/2018    BILITOT 0.4 04/05/2018    AST 28 06/07/2018    AST 14 04/05/2018    ALT 15 06/07/2018    ALT 6 04/05/2018    ALKPHOS 55 06/07/2018    ALKPHOS 90 04/05/2018    INR 1.1 05/22/2015    ESR 45 (H) 01/17/2013    CRP <0.5 01/17/2013     Estimated Creatinine Clearance: 17.4 mL/min (A) (based on SCr of 3.36 mg/dL (H)).      Imaging:  Imaging was reviewed in Epic. Pertinent findings include CXR 8/14.  Improved effusions b/l       Microbiology:  Microbiology was reviewed in Epic. Pertinent findings include fungal elements seen on bronch culture.

## 2018-06-07 NOTE — Unmapped (Signed)
Patient had esophageal balloon done today. Peep was changed based on esophageal balloon to 14. FIO2 was taken down to 40% and RR was taken down to 24 based on ABG, MD was made aware. Will continue to monitor.

## 2018-06-07 NOTE — Unmapped (Signed)
Patient had an uneventful day with the goal of weaning norepinephrine. Weaned off for a couple hours, but unfortunately had to be restarted due to [facility] and maps <65. Remains tachycardic which is noted to be different than yesterday (70-90) with heart rates today 105-130. Respiratory remains on ventilator with a notable cuff leak with neck positioning. Minimal ET secretions. Oral secretions pink tinged, thick, and moderate. Edema noted and appears more edematous today per nursing assessment. Neurologically remained sedated due to events overnight including tachycardia and agitation when propofol was weaned off. Weaned propofol minimally (see MAR). Pt appears and remains comfortable on scheduled oxy and propofol gtt. Abdomen rounded with active, distant, BS. Tube feeds (Osmolite) at goal so 20 hr with 30 Q4 water flushes and water with scheduled medications. Minimal output in FMS. Foley remains in place. Urine output minimal. Creatine trending up. Providers aware. Skin intact. Low grade fever throughout the day today tmax 37.3. Pt re cultured off central line and peripheral, urine, and sputum. Sputum had no organisms and unable to culture.   Access: Right IJ, left PIV, foley, and FMS     Problem: Adult Inpatient Plan of Care  Goal: Plan of Care Review  Outcome: Ongoing - Unchanged  Goal: Patient-Specific Goal (Individualization)  Outcome: Ongoing - Unchanged  Goal: Absence of Hospital-Acquired Illness or Injury  Outcome: Ongoing - Unchanged  Note:   Cultured urine, sputum and blood cultures x 2 for norepi initiation and hotn.   Goal: Optimal Comfort and Wellbeing  Outcome: Ongoing - Unchanged  Note:   Comfortable on propofol infusion and scheduled oxy.   Goal: Rounds/Family Conference  Outcome: Ongoing - Unchanged     Problem: Latex Allergy  Goal: Absence of Allergy Symptoms  Outcome: Ongoing - Unchanged     Problem: Skin Injury Risk Increased  Goal: Skin Health and Integrity  Outcome: Ongoing - Unchanged     Problem: Fall Injury Risk  Goal: Absence of Fall and Fall-Related Injury  Outcome: Ongoing - Unchanged     Problem: Communication Impairment (Mechanical Ventilation, Invasive)  Goal: Effective Communication  Outcome: Ongoing - Unchanged     Problem: Device-Related Complication Risk (Mechanical Ventilation, Invasive)  Goal: Optimal Device Function  Outcome: Ongoing - Unchanged     Problem: Nutrition Impairment (Mechanical Ventilation, Invasive)  Goal: Optimal Nutrition Delivery  Outcome: Ongoing - Unchanged     Problem: Skin and Tissue Injury (Mechanical Ventilation, Invasive)  Goal: Absence of Device-Related Skin and Tissue Injury  Outcome: Ongoing - Unchanged     Problem: Ventilator-Induced Lung Injury (Mechanical Ventilation, Invasive)  Goal: Absence of Ventilator-Induced Lung Injury  Outcome: Ongoing - Unchanged     Problem: Self-Care Deficit  Goal: Improved Ability to Complete Activities of Daily Living  Outcome: Ongoing - Unchanged     Problem: Hypertension Comorbidity  Goal: Blood Pressure in Desired Range  Outcome: Ongoing - Unchanged     Problem: Pain Chronic (Persistent) (Comorbidity Management)  Goal: Acceptable Pain Control and Functional Ability  Outcome: Ongoing - Unchanged     Problem: Fluid Imbalance (Pneumonia)  Goal: Fluid Balance  Outcome: Ongoing - Unchanged     Problem: Infection (Pneumonia)  Goal: Resolution of Infection Signs/Symptoms  Outcome: Ongoing - Unchanged     Problem: Respiratory Compromise (Pneumonia)  Goal: Effective Oxygenation and Ventilation  Outcome: Ongoing - Unchanged     Problem: Adult Inpatient Plan of Care  Goal: Readiness for Transition of Care  Outcome: Not Progressing     Problem: Inability to Wean (Mechanical Ventilation,  Invasive)  Goal: Mechanical Ventilation Liberation  Outcome: Not Progressing

## 2018-06-08 DIAGNOSIS — J9601 Acute respiratory failure with hypoxia: Principal | ICD-10-CM

## 2018-06-08 LAB — CBC
HEMATOCRIT: 24.9 % — ABNORMAL LOW (ref 36.0–46.0)
HEMOGLOBIN: 8 g/dL — ABNORMAL LOW (ref 12.0–16.0)
MEAN CORPUSCULAR HEMOGLOBIN: 31.6 pg (ref 26.0–34.0)
MEAN PLATELET VOLUME: 9.6 fL (ref 7.0–10.0)
PLATELET COUNT: 113 10*9/L — ABNORMAL LOW (ref 150–440)
RED BLOOD CELL COUNT: 2.54 10*12/L — ABNORMAL LOW (ref 4.00–5.20)
RED CELL DISTRIBUTION WIDTH: 18.2 % — ABNORMAL HIGH (ref 12.0–15.0)
WBC ADJUSTED: 5.2 10*9/L (ref 4.5–11.0)

## 2018-06-08 LAB — EGFR CKD-EPI NON-AA FEMALE: Lab: 12 — ABNORMAL LOW

## 2018-06-08 LAB — COMPREHENSIVE METABOLIC PANEL
ALBUMIN: 2.7 g/dL — ABNORMAL LOW (ref 3.5–5.0)
ALKALINE PHOSPHATASE: 53 U/L (ref 38–126)
ANION GAP: 9 mmol/L (ref 9–15)
AST (SGOT): 27 U/L (ref 14–38)
BILIRUBIN TOTAL: 1.4 mg/dL — ABNORMAL HIGH (ref 0.0–1.2)
BUN / CREAT RATIO: 26
CALCIUM: 8.5 mg/dL (ref 8.5–10.2)
CHLORIDE: 114 mmol/L — ABNORMAL HIGH (ref 98–107)
CO2: 18 mmol/L — ABNORMAL LOW (ref 22.0–30.0)
CREATININE: 3.76 mg/dL — ABNORMAL HIGH (ref 0.60–1.00)
EGFR CKD-EPI AA FEMALE: 14 mL/min/{1.73_m2} — ABNORMAL LOW (ref >=60)
EGFR CKD-EPI NON-AA FEMALE: 12 mL/min/{1.73_m2} — ABNORMAL LOW (ref >=60)
GLUCOSE RANDOM: 125 mg/dL (ref 65–179)
POTASSIUM: 3.7 mmol/L (ref 3.5–5.0)
PROTEIN TOTAL: 6 g/dL — ABNORMAL LOW (ref 6.5–8.3)
SODIUM: 141 mmol/L (ref 135–145)

## 2018-06-08 LAB — BLOOD GAS, ARTERIAL
FIO2 ARTERIAL: 40
HCO3 ARTERIAL: 18 mmol/L — ABNORMAL LOW (ref 22–27)
O2 SATURATION ARTERIAL: 98.7 % (ref 94.0–100.0)
PH ARTERIAL: 7.39 (ref 7.35–7.45)

## 2018-06-08 LAB — CYCLOSPORINE (FPIA) BLOOD: Lab: 111

## 2018-06-08 LAB — PHOSPHORUS: Phosphate:MCnc:Pt:Ser/Plas:Qn:: 7.5 — ABNORMAL HIGH

## 2018-06-08 LAB — RED BLOOD CELL COUNT: Lab: 2.54 — ABNORMAL LOW

## 2018-06-08 LAB — MAGNESIUM: Magnesium:MCnc:Pt:Ser/Plas:Qn:: 1.8

## 2018-06-08 LAB — SPECIMEN SOURCE

## 2018-06-08 LAB — TROPONIN I: Troponin I.cardiac:MCnc:Pt:Ser/Plas:Qn:: 0.105

## 2018-06-08 NOTE — Unmapped (Signed)
MICU Progress Note     Date of Service: 06/08/2018    Problem List:       Interval history: Katherine Norton is a 62 y.o. female with  PMHx of liver transplant (1999) on immunosuppresion, chronic back pain, anxiety, HTN, CKD,  that presents to [facility] as a transfer from [facility], with respiratory failure and possible contained duodenal perforation.      24hr events: Worsening Cr, continued pressor requirement. Some family discussions and goc talks yesterday. Plan to see how she does over the next few days before making any decisions    Neurological   #acute on chronic pain/sedation  - low dose propofol gtt   - add oxy 10 mg scheduled every 8 hrs    #acute encephalopathy: Resolved-  presented to OSH altered, but also hypoxic at that time. Currently [facility]    Pulmonary   #acute hypoxic respiratory failure likely due to multifocal PNA.  Intubated on 8/1 CT chest with Scattered airspace opacities in the left apex, left upper lobe and superior segment of left lower lobe; concerning for pneumonia.  Currently on PRVC 12/300/60% +8 PEEP with most recent ABG 7.24/46/99/97%.  Will adjust minute ventilation for resp acidosis and wean Fi02.    -sedation as needed for vent compliance.    8/12: Plan SBT once sedation is lowered.  May need diuresis as well. Extubated, followed by progressively worsening respiratory failure  8/13: Re-intubated early morning, weaning vent. Bronch completed, culture and cell count sent.  8/14: Will place esophogeal balloon to figure out optimal PEEP. Optimal PEEP found to be 14    Cardiovascular   #h/o hypertension: hold home metoprolol and amlodipine as recently off pressor  - prn hydralazine    Echo 7/29: EF 65-70%, mod pulm HTN and borderline dilated RV   - repeat echo still pending    #Hypotension:  - developed pressor requirement 8/15  - Continue norepi gtt for MAP>65    Renal   #Acute on chronic renal failure  - Cr baseline appears to be ~1.7--> 3-->2.87  - Was on diuresis regimen at OSH, but was net pos due to enteral feeds  - Trend and may require CRRT or diuresis regimen  - Trial of 40mg  lasix 8/13 w/ good output but decent bump in Cr. Will hold further diuresis today  - Cr continues to rise  - nephrology consulted 8/16, appreciate recs    Infectious Disease/Autoimmune   #septic shock due to ? PNA.  Initially seen at OSH and had c/f perforated duodenal ulcer, but CT scan here non concerning.  CT chest with possible multifocal PNA.    - appreciate ID's assistance.  - S/p bronch 8/13 w/ no growth  - UA, blood cultures and cxr ordered for new pressor requirement.    Abx:   Vanco (8/11-->8/12)  Mica (8/11-->8/12)  Flagyl (8/10-->8/14)  Cefepime  (8/10-->)  Voriconazole (8/12-->8/15)  Linezolid (8/15- )    Cx:  8/11: fungal neg  8/11: LRC: pending  8/10: blood cx pending  8/10: BAL: pending    Abx: OSH  ceftaroline and flagyl  levaquin  meropenem     Cx OSH:  - 7/29 Strep pneumo Ag neg, RVP neg, urine Legionella Ag neg  - 7/29 bd cx NGTD  OSH: Fungal, pneumocystis/histoplasma, AFB, Gram on BAL all were negative.  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  Per surgery, NO perf.      #abd pain: pt endorsing abd pain.  Will check lipase.  Start TF if no plans to extubate.     Malnutrition Assessment: Not done yet.   - TF running      Heme/Coag   Acute blood loss- no s/s of bleeding  - Hgb drop to 5.8 at OSH with transfusion  - cbc stable.    - DVT ppx held due to drop in h/h.  Restarted.      Endocrine   nai    Prophylaxis/LDA/Restraints/Consults   Can CVC be removed? No: need for medications requiring central access (e.g. pressors)   Can A-line be removed? No: frequent ABGs  Can Foley be removed? No: Need continuous I/O Mobility plan: Step 1 - Range of motion    Feeding: Trickle feeds, advance as tolerated  Analgesia: Pain adequately controlled  Sedation SAT/SBT: No PEEP > 8  Thrombembolic ppx: SQ heparin  Head of bed: >30 deg  Ulcer ppx: Yes, mechanical ventilation > 3 days, not on tube feeds  Glucose within target range: Yes, in range    RASS at goal? Yes  [place] Agitation Assessment Scale (RASS) : -2 (06/08/2018  8:16 AM)     Can antipsychotics be stopped? N/A, not on antipsychotics  CAM-ICU Result: Positive (06/07/2018  8:00 AM)      Patient Lines/Drains/Airways Status    Active Active Lines, Drains, & Airways     Name:   Placement date:   Placement time:   Site:   Days:    ETT  7.5   06/05/18    0037     3    CVC Triple Lumen 06/03/18 Non-tunneled Right Internal jugular   06/03/18    1503    Internal jugular   4    NG/OG Tube Right nostril   06/02/18    2121    Right nostril   5    Urethral Catheter   06/02/18    2118    ???   5    Peripheral IV 06/02/18 Posterior;Left Forearm   06/02/18    2120    Forearm   5    Arterial Line 06/03/18 Right Radial   06/03/18    0007    Radial   5              Patient Lines/Drains/Airways Status    Active Wounds     Name:   Placement date:   Placement time:   Site:   Days:    Wound 06/08/18 Pressure Injury Buttocks Posterior pink Stage 1   06/08/18    0001    Buttocks   less than 1                Goals of Care     Code Status: Full Code    Designated Healthcare Decision Maker:  Ms. Bracher currently lacks decisional capacity for healthcare decision-making and is unable to designate a surrogate healthcare decision maker. Ms. Obey designated healthcare decision maker(s) is/are Katherine Norton and Katherine Norton (the patient's adult child) as denoted by hospital policy for patients without a known preference.      Subjective   Katherine Norton is a 62 y.o. female with PMHx of liver transplant (1999) on immunosuppresion, chronic  back pain, anxiety, HTN, CKD,  that presents to [facility] as a transfer from [facility]. Patient initially presented to the OSH on 05/20/18 due to acute worsening shortness of breath and altered mental status. Patient was sating 68% on RA, but 90% on CPAP. Patient had severe acidemia on ABG as well as acute on chronic renal failure with Cr of 3.89. CXR at that time showed consolidation with concern for pneumonia and was febrile. Patient's initial lactate was 2.70. Patient was started on IV ceftaroline and flagyl initially.      Objective     Vitals - past 24 hours  Heart Rate:  [90-116] 100  SpO2 Pulse:  [90-115] 100  Resp:  [24-111] 24  FiO2 (%):  [30 %-40 %] 30 %  SpO2:  [96 %-100 %] 96 % Intake/Output  I/O last 3 completed shifts:  In: 2881.9 [I.V.:1181.9; NG/GT:1100; IV Piggyback:600]  Out: 1090 [Urine:990; Stool:100]     Physical Exam:    General: obese female, awakens to stimuli, nods appropriately  HEENT: PERRLA  CV: RRR, no mrg  Pulm: coarse bs bilaterally  GI: soft, distended, non-tender  MSK: no edema  Skin: intact  Neuro: awake, FC, mae      Continuous Infusions:   ??? norepinephrine bitartrate-NS 5 mcg/min (06/08/18 0800)   ??? propofol 20 mcg/kg/min (06/08/18 0600)       Scheduled Medications:   ??? calcitonin (salmon)  1 spray Alternating Nares Daily (RT)   ??? Cefepime  1 g Intravenous Q12H   ??? chlorhexidine  5 mL [place] BID   ??? cycloSPORINE  50 mg NG tube BID   ??? esomeprazole  40 mg NG tube daily   ??? heparin (porcine) for subcutaneous use  5,000 Units Subcutaneous [facility]   ??? linezolid  600 mg Intravenous Q12H [facility]   ??? oxyCODONE  10 mg NG tube [facility]   ??? sodium bicarbonate  650 mg NG tube 4x Daily       PRN medications:      Data/Imaging Review: Reviewed in Epic and personally interpreted on 06/08/2018. See EMR for detailed results.     .  Critical Care Attestation     This patient is critically ill or injured with the impairment of vital organ systems such that there is a high probability of imminent or life threatening deterioration in the patient's condition. This patient must remain in the ICU for ongoing evaluation of the comprehensive management plan outlined in this note. I directly provided critical care services as documented in this note and the critical care time spent (0 min) is exclusive of separately billable procedures.    Daryel November, ACNP

## 2018-06-08 NOTE — Unmapped (Signed)
Cyclosporine Therapeutic Monitoring Pharmacy Note    Katherine Norton is a 62 y.o. female continuing cyclosporine NEORAL.     Indication: Liver transplant     Date of Transplant: 05/15/1995      Prior Dosing Information: Home regimen 50mg  BID     Goals:  Therapeutic Drug Levels  Cyclosporine trough goal: 50-100 ng/mL per 12/2017 note    Additional Clinical Monitoring/Outcomes  ? Monitor renal function (SCr and urine output) and liver function (LFTs)  ? Monitor for signs/symptoms of adverse events (e.g., hyperglycemia, hyperkalemia, hypomagnesemia, hypertension, headache, tremor)    Results:   Cyclosporine level: 111 ng/mL, drawn 2 hours early    Pharmacokinetic Considerations and Significant Drug Interactions:  ? Concurrent hepatotoxic medications: None identified  ? Concurrent CYP3A4 substrates/inhibitors: None identified  ? Concurrent nephrotoxic medications: None identified    Assessment/Plan:  Recommendation(s)  ? Decrease to 50 mg in AM and 25mg  in PM d/t AKI and recent voriconazole use, which can both be attributed to her increased CsA levels.     Follow-up  ? Next level should be ordered on 06/11/2018 at with AM labs.   ? A pharmacist will continue to monitor and recommend levels as appropriate    Please page service pharmacist with questions/clarifications.    Simonne Martinet, PharmD Candidate     S. Louie Bun, PharmD  PGY-2 Infectious Diseases Resident

## 2018-06-08 NOTE — Unmapped (Signed)
Vented ,no vent changes today . Family at bedside ,continue plan of care . Will wean pressures as tolerated. Tube in progress. Nephrology consult done today . Edema +++ . Continue to monitor urine output.  Problem: Adult Inpatient Plan of Care  Goal: Plan of Care Review  Outcome: Progressing  Goal: Patient-Specific Goal (Individualization)  Outcome: Progressing  Goal: Absence of Hospital-Acquired Illness or Injury  Outcome: Progressing  Goal: Optimal Comfort and Wellbeing  Outcome: Progressing  Goal: Readiness for Transition of Care  Outcome: Progressing  Goal: Rounds/Family Conference  Outcome: Progressing     Problem: Latex Allergy  Goal: Absence of Allergy Symptoms  Outcome: Progressing     Problem: Skin Injury Risk Increased  Goal: Skin Health and Integrity  Outcome: Progressing     Problem: Fall Injury Risk  Goal: Absence of Fall and Fall-Related Injury  Outcome: Progressing     Problem: Communication Impairment (Mechanical Ventilation, Invasive)  Goal: Effective Communication  Outcome: Progressing     Problem: Device-Related Complication Risk (Mechanical Ventilation, Invasive)  Goal: Optimal Device Function  Outcome: Progressing     Problem: Inability to Wean (Mechanical Ventilation, Invasive)  Goal: Mechanical Ventilation Liberation  Outcome: Progressing     Problem: Nutrition Impairment (Mechanical Ventilation, Invasive)  Goal: Optimal Nutrition Delivery  Outcome: Progressing     Problem: Skin and Tissue Injury (Mechanical Ventilation, Invasive)  Goal: Absence of Device-Related Skin and Tissue Injury  Outcome: Progressing     Problem: Ventilator-Induced Lung Injury (Mechanical Ventilation, Invasive)  Goal: Absence of Ventilator-Induced Lung Injury  Outcome: Progressing     Problem: Self-Care Deficit  Goal: Improved Ability to Complete Activities of Daily Living  Outcome: Progressing     Problem: Hypertension Comorbidity  Goal: Blood Pressure in Desired Range  Outcome: Progressing     Problem: Pain Chronic (Persistent) (Comorbidity Management)  Goal: Acceptable Pain Control and Functional Ability  Outcome: Progressing     Problem: Fluid Imbalance (Pneumonia)  Goal: Fluid Balance  Outcome: Progressing     Problem: Infection (Pneumonia)  Goal: Resolution of Infection Signs/Symptoms  Outcome: Progressing     Problem: Respiratory Compromise (Pneumonia)  Goal: Effective Oxygenation and Ventilation  Outcome: Progressing

## 2018-06-08 NOTE — Unmapped (Signed)
Patient Summary: No adverse events throughout the night. No SOB or acute distress. No SAT this am per MD order. All lines in tubes in place.  Plan of care continued.

## 2018-06-08 NOTE — Unmapped (Signed)
Patient remains on same vent settings. Minimal cuff leak with head position. Will continue to monitor.  Problem: Inability to Wean (Mechanical Ventilation, Invasive)  Goal: Mechanical Ventilation Liberation  Outcome: Not Progressing

## 2018-06-08 NOTE — Unmapped (Signed)
IMMUNOCOMPROMISED HOST INFECTIOUS DISEASE PROGRESS NOTE    Assessment/Plan:     Katherine Norton is a 62 y.o. female with PMHx of cryptogenic cirrhosis s/p OLT who presents with acute respiratory failure.     ID Problem List:  #Hx of cryptogenic cirrhosis s/p orthotopic liver transplant (1996)  -Serologies: unknown  -Immunosuppression: cyclosporine BID (goal ~50-100)  ??  #Co-morbiditis: acute on CKD, chronic back pain  ??  #Acute hypoxic respiratory failure, concern for pneumonia - 06/02/18  - 7/28 OSH abnormal chest imaging - diffuse patchy opacities in R lung; pulm edema, R lobar pneumonia, CAP vs. Aspiration  - 8/1 intubated for worsening respiratory status  - 06/03/18 BAL- Fungal stain: +fungal elements present, that after discussion with micro lab most likely represent candida   - 06/03/18 CT chest w/o contrast: scattered airspace opacities in L apex, LUL and superior segment of LLL, concerning for pneumonia  - 06/05/18 Bronch cultures: NGTD, 2+ PMNs, no organism.   - May be volume related, proBNP from 8/13 was 75,000.   - Vent settings improving   - CXR 8/15 improving     Recommendations:  - stop linezolid, as MRSA has not been recovered  - Decrease Cefepime to 2 gm q 24 hours given decreased renal function   - follow up Echo      The [facility] ID service will continue to follow.  Please page the ID Transplant/Liquid Oncology Fellow consult at [phone] with questions.  Patient discussed with Dr. Verlan Friends.    Elliot Gault, MD  Fellow, [facility] Division of Infectious Diseases     Immunocompromised Host ID Attending Addendum.  I evaluated the patient today.  The findings and recommendations as documented in the fellow's note reflect my direct input.    Timothy Lasso Duin  Immunocompromised Host Infectious Diseases  Pager [phone]        Subjective:   - Patient unable to provide history  - Crt continues to increase   - WBC stable   - Vori stopped yesterday   - Started on linezolid yesterday   - Remains on the vent, settings slightly improved from yesterday  - Remains on NE 5 mcg, largely unchanged from yesterday     Medications:  Current antibiotics:  Cefepime 1g q12h IV   Metronidazole 500mg  q8h NG  Voriconazole 200mg  q12h NG  ??  Previous antibiotics:  Micafungin 100mg  q24h IV??    Current/Prior immunomodulators:  Cyclosporine 50mg  q12h NG    Other medications reviewed.    Objective:     Vital Signs last 24 hours:  Core Temp:  [37 ??C (98.6 ??F)-37.7 ??C (99.9 ??F)] 37.4 ??C (99.3 ??F)  Heart Rate:  [90-116] 100  SpO2 Pulse:  [90-115] 104  Resp:  [24-111] 24  A BP-2: (83-156)/(54-77) 120/67  MAP:  [64 mmHg-97 mmHg] 82 mmHg  FiO2 (%):  [30 %-40 %] 30 %  SpO2:  [96 %-100 %] 96 %    Physical Exam:  Patient Lines/Drains/Airways Status    Active Active Lines, Drains, & Airways     Name:   Placement date:   Placement time:   Site:   Days:    ETT  7.5   06/05/18    0037     3    CVC Triple Lumen 06/03/18 Non-tunneled Right Internal jugular   06/03/18    1503    Internal jugular   4    NG/OG Tube Right nostril   06/02/18    2121  Right nostril   5    Urethral Catheter   06/02/18    2118    ???   5    Peripheral IV 06/02/18 Posterior;Left Forearm   06/02/18    2120    Forearm   5    Arterial Line 06/03/18 Right Radial   06/03/18    0007    Radial   5                GEN:  intubated and sedated  EYES: eyes closed  ENT:ETT in place  CV:RRR and no abnormal heart sounds noted  PULM:on ventilator, coarse breath sounds bilaterally  ZOX:WRUE, diffuse, mild tenderness with palpation, obese  AV:WUJWJ catheter in place  SKIN:no petechiae, ecchymoses or obvious rashes on clothed exam  NEURO:intubated and sedated    Labs:  Lab Results   Component Value Date    WBC 5.2 06/08/2018    WBC 5.9 06/07/2018    WBC 3.7 (L) 06/06/2018    WBC 3.3 (L) 04/05/2018    WBC 4.2 03/09/2018    WBC 7.4 01/12/2018    HGB 8.0 (L) 06/08/2018    HGB 7.2 (L) 06/06/2018    HCT 24.9 (L) 06/08/2018    HCT 30.8 (L) 04/05/2018    PLT 113 (L) 06/08/2018    PLT 163 04/05/2018    NEUTROABS 1.9 04/05/2018    LYMPHSABS 0.9 04/05/2018    EOSABS 0.1 04/05/2018    NA 141 06/08/2018    NA 142 06/06/2018    K 3.7 06/08/2018    K 3.7 06/06/2018    BUN 96 (H) 06/08/2018    BUN 30 (H) 04/05/2018    CREATININE 3.76 (H) 06/08/2018    CREATININE 3.36 (H) 06/07/2018    CREATININE 3.19 (H) 06/06/2018    CREATININE 3.19 (H) 06/06/2018    CREATININE 2.32 (H) 04/05/2018    CREATININE 2.23 (H) 03/09/2018    CREATININE 1.96 (H) 01/12/2018    GLU 125 06/08/2018    GLU 115 (H) 04/05/2018    MG 1.8 06/08/2018    MG 2.0 04/05/2018    ALBUMIN 2.7 (L) 06/08/2018    ALBUMIN 3.9 12/04/2014    BILITOT 1.4 (H) 06/08/2018    BILITOT 0.4 04/05/2018    AST 27 06/08/2018    AST 14 04/05/2018    ALT 9 (L) 06/08/2018    ALT 6 04/05/2018    ALKPHOS 53 06/08/2018    ALKPHOS 90 04/05/2018    INR 1.1 05/22/2015    ESR 45 (H) 01/17/2013    CRP <0.5 01/17/2013     Estimated Creatinine Clearance: 15.5 mL/min (A) (based on SCr of 3.76 mg/dL (H)).      Imaging:  Imaging was reviewed in Epic. Pertinent findings include CXR 8/15.  Improving pulmonary edema     Microbiology:  No new microbiology results.

## 2018-06-09 LAB — CALCIUM IONIZED ARTERIAL (MG/DL): Calcium.ionized:MCnc:Pt:Bld:Qn:: 4.57

## 2018-06-09 LAB — WBC ADJUSTED: Lab: 3.8 — ABNORMAL LOW

## 2018-06-09 LAB — COMPREHENSIVE METABOLIC PANEL
ALBUMIN: 2.8 g/dL — ABNORMAL LOW (ref 3.5–5.0)
ALKALINE PHOSPHATASE: 78 U/L (ref 38–126)
ALT (SGPT): 22 U/L (ref 15–48)
ANION GAP: 15 mmol/L (ref 9–15)
AST (SGOT): 27 U/L (ref 14–38)
BILIRUBIN TOTAL: 1.2 mg/dL (ref 0.0–1.2)
BLOOD UREA NITROGEN: 97 mg/dL — ABNORMAL HIGH (ref 7–21)
CALCIUM: 8.3 mg/dL — ABNORMAL LOW (ref 8.5–10.2)
CO2: 15 mmol/L — ABNORMAL LOW (ref 22.0–30.0)
CREATININE: 4.2 mg/dL — ABNORMAL HIGH (ref 0.60–1.00)
EGFR CKD-EPI AA FEMALE: 12 mL/min/{1.73_m2} — ABNORMAL LOW (ref >=60)
EGFR CKD-EPI NON-AA FEMALE: 11 mL/min/{1.73_m2} — ABNORMAL LOW (ref >=60)
GLUCOSE RANDOM: 138 mg/dL (ref 65–179)
POTASSIUM: 3.7 mmol/L (ref 3.5–5.0)
PROTEIN TOTAL: 6.4 g/dL — ABNORMAL LOW (ref 6.5–8.3)
SODIUM: 139 mmol/L (ref 135–145)

## 2018-06-09 LAB — BLOOD GAS CRITICAL CARE PANEL, ARTERIAL
BASE EXCESS ARTERIAL: -6.6 — ABNORMAL LOW (ref -2.0–2.0)
CALCIUM IONIZED ARTERIAL (MG/DL): 4.57 mg/dL (ref 4.40–5.40)
HCO3 ARTERIAL: 18 mmol/L — ABNORMAL LOW (ref 22–27)
HEMOGLOBIN BLOOD GAS: 6.4 g/dL — ABNORMAL LOW (ref 12.00–16.00)
O2 SATURATION ARTERIAL: 95.8 % (ref 94.0–100.0)
PCO2 ARTERIAL: 35.3 mmHg (ref 35.0–45.0)
PH ARTERIAL: 7.33 — ABNORMAL LOW (ref 7.35–7.45)
PO2 ARTERIAL: 98.5 mmHg (ref 80.0–110.0)
POTASSIUM WHOLE BLOOD: 3.6 mmol/L (ref 3.4–4.6)
SODIUM WHOLE BLOOD: 141 mmol/L (ref 135–145)

## 2018-06-09 LAB — PHOSPHORUS: Phosphate:MCnc:Pt:Ser/Plas:Qn:: 7.2 — ABNORMAL HIGH

## 2018-06-09 LAB — CBC
HEMATOCRIT: 24.7 % — ABNORMAL LOW (ref 36.0–46.0)
MEAN CORPUSCULAR HEMOGLOBIN CONC: 31.4 g/dL (ref 31.0–37.0)
MEAN CORPUSCULAR HEMOGLOBIN: 31.3 pg (ref 26.0–34.0)
MEAN CORPUSCULAR VOLUME: 99.7 fL (ref 80.0–100.0)
MEAN PLATELET VOLUME: 11.7 fL — ABNORMAL HIGH (ref 7.0–10.0)
PLATELET COUNT: 80 10*9/L — ABNORMAL LOW (ref 150–440)
RED BLOOD CELL COUNT: 2.48 10*12/L — ABNORMAL LOW (ref 4.00–5.20)
RED CELL DISTRIBUTION WIDTH: 17.5 % — ABNORMAL HIGH (ref 12.0–15.0)

## 2018-06-09 LAB — EGFR CKD-EPI NON-AA FEMALE: Lab: 11 — ABNORMAL LOW

## 2018-06-09 LAB — VORICONAZOLE LEVEL: Lab: 4.5

## 2018-06-09 LAB — TRIGLYCERIDES: Triglyceride:MCnc:Pt:Ser/Plas:Qn:: 300 — ABNORMAL HIGH

## 2018-06-09 LAB — MAGNESIUM: Magnesium:MCnc:Pt:Ser/Plas:Qn:: 1.8

## 2018-06-09 NOTE — Unmapped (Signed)
IMMUNOCOMPROMISED HOST INFECTIOUS DISEASE PROGRESS NOTE    Assessment/Plan:     Katherine Norton is a 62 y.o. female with PMHx of cryptogenic cirrhosis s/p OLT who presents with acute respiratory failure.     ID Problem List:  #Hx of cryptogenic cirrhosis s/p orthotopic liver transplant (1996)  -Serologies: unknown  -Immunosuppression: cyclosporine BID (goal ~50-100)  ??  #Co-morbiditis: acute on CKD, chronic back pain, severe RV dysfunction  ??  #Acute hypoxic respiratory failure, concern for pneumonia - 06/02/18  - 7/28 OSH abnormal chest imaging - diffuse patchy opacities in R lung; pulm edema, R lobar pneumonia, CAP vs. Aspiration  - 8/1 intubated for worsening respiratory status  - 06/03/18 BAL- Fungal stain: +fungal elements present, that after discussion with micro lab most likely represent candida   - 06/03/18 CT chest w/o contrast: scattered airspace opacities in L apex, LUL and superior segment of LLL, concerning for pneumonia  - 06/05/18 Bronch cultures: NGTD, 2+ PMNs, no organism.   - May be volume related, proBNP from 8/13 was 75,000.   - Vent settings improving   - CXR 8/15 improving         Recommendations:  - Given echo findings, symptoms could be consistent w/ right heart failure and fluid overload.  - Continue to monitor off abx  - ICID will sign off. Please re-page with additional questions or concerns.      The I[facility] ID service will sign off.  Please page the ID Transplant/Liquid Oncology Fellow consult at [phone] with questions.  Patient discussed with Dr. Verlan Friends.    Ouida Sills, MD  Fellow, [facility] Division of Infectious Diseases             Subjective:   - AF, remains on pressors, intubated  - Vent settings stable  - Cefepime and linezolid discontinued    Medications:  Current antibiotics:  Cefepime 1g q12h IV   Metronidazole 500mg  q8h NG  Voriconazole 200mg  q12h NG  ??  Previous antibiotics:  Micafungin 100mg  q24h IV??    Current/Prior immunomodulators:  Cyclosporine 50mg  q12h NG    Other medications reviewed.    Objective:     Vital Signs last 24 hours:  Temp:  [37.9 ??C] 37.9 ??C  Core Temp:  [36.8 ??C-37.5 ??C] 37.2 ??C  Heart Rate:  [100-121] 105  SpO2 Pulse:  [100-121] 105  Resp:  [14-29] 24  A BP-2: (78-126)/(50-74) 94/58  MAP:  [60 mmHg-90 mmHg] 69 mmHg  FiO2 (%):  [30 %] 30 %  SpO2:  [92 %-99 %] 98 %    Physical Exam:  Patient Lines/Drains/Airways Status    Active Active Lines, Drains, & Airways     Name:   Placement date:   Placement time:   Site:   Days:    ETT  7.5   06/05/18    0037     4    CVC Triple Lumen 06/03/18 Non-tunneled Right Internal jugular   06/03/18    1503    Internal jugular   5    NG/OG Tube Right nostril   06/02/18    2121    Right nostril   6    Urethral Catheter   06/02/18    2118    ???   6    Peripheral IV 06/02/18 Posterior;Left Forearm   06/02/18    2120    Forearm   6    Arterial Line 06/03/18 Right Radial   06/03/18    0007  Radial   6              GEN:  intubated and sedated  EYES: eyes closed  ENT:ETT in place  CV:RRR and no abnormal heart sounds noted  PULM:on ventilator, coarse breath sounds bilaterally  ONG:EXBM, diffuse, mild tenderness with palpation, obese  WU:XLKGM catheter in place  SKIN:no petechiae, ecchymoses or obvious rashes on clothed exam  NEURO:intubated and sedated    Labs:  Lab Results   Component Value Date    WBC 3.8 (L) 06/09/2018    WBC 5.2 06/08/2018    WBC 5.9 06/07/2018    WBC 3.3 (L) 04/05/2018    WBC 4.2 03/09/2018    WBC 7.4 01/12/2018    HGB 7.8 (L) 06/09/2018    HGB 7.2 (L) 06/06/2018    HCT 24.7 (L) 06/09/2018    HCT 30.8 (L) 04/05/2018    PLT 80 (L) 06/09/2018    PLT 163 04/05/2018    NEUTROABS 1.9 04/05/2018    LYMPHSABS 0.9 04/05/2018    EOSABS 0.1 04/05/2018    NA 139 06/09/2018    NA 141 06/09/2018    NA 142 06/06/2018    K 3.7 06/09/2018    K 3.6 06/09/2018    K 3.7 06/06/2018    BUN 97 (H) 06/09/2018    BUN 30 (H) 04/05/2018    CREATININE 4.20 (H) 06/09/2018    CREATININE 3.76 (H) 06/08/2018    CREATININE 3.36 (H) 06/07/2018 CREATININE 2.32 (H) 04/05/2018    CREATININE 2.23 (H) 03/09/2018    CREATININE 1.96 (H) 01/12/2018    GLU 138 06/09/2018    GLU 115 (H) 04/05/2018    MG 1.8 06/09/2018    MG 2.0 04/05/2018    ALBUMIN 2.8 (L) 06/09/2018    ALBUMIN 3.9 12/04/2014    BILITOT 1.2 06/09/2018    BILITOT 0.4 04/05/2018    AST 27 06/09/2018    AST 14 04/05/2018    ALT 22 06/09/2018    ALT 6 04/05/2018    ALKPHOS 78 06/09/2018    ALKPHOS 90 04/05/2018    INR 1.1 05/22/2015    ESR 45 (H) 01/17/2013    CRP <0.5 01/17/2013     Estimated Creatinine Clearance: 13.9 mL/min (A) (based on SCr of 4.2 mg/dL (H)).      Imaging:  No new imaging    Microbiology:  No new microbiology results.

## 2018-06-09 NOTE — Unmapped (Signed)
Nephrology Consult Note      Assessment/Recommendations: Katherine Norton is a 62 y.o. 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 We have been consulted for evaluation of AKI on CKD. Marland Kitchen     Her most recent baseline creatinine appears to be between 1.9 to 2.2.  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  Upon transfer from outside hospital, her creatinine was 2.9 which stayed around that until 8/13 when it was noted to keep up and reached 3.2 on 8/14 and 3.4 on 8/15.  However a bigger jump was seen today to 3.8.  Her UA is positive for 1+ leukocyte esterases, 1+ proteins and some ketones.  Her urine sediment that was examined today showed abundant granular casts and renal tubular epithelial cells along with some crystals; the sediment is consistent with ATN.    The etiology of her tubular injury is not perfectly clear, however the initial jump from 3 to 3.2 between 8/13 and 8/14 can be attributed to the questionable cardiac arrest that occurred while attempting to reintubate the patient.  Last night, the blood pressures were noted to be soft which we think might have compounded the acute injury on already chronically diseased kidney.    We recommend watchful monitoring of patient's volume status and keeping the patient euvolemic while maintaining blood pressures to support adequate perfusion to the kidneys.  We expect the creatinine to worsen before it gets better.  At the moment, she does not have an indication for dialysis; we will continue to follow this patient along and will discuss renal replacement therapy if and when needed.    Additionally:  - Strict I/O's  - Avoid nephrotoxic drugs including but not limited to NSAIDs, contrasted studies, and fleets enemas   - Dose all meds for creatinine clearance   - Unless absolutely necessary, no MRIs with gadolinium given potential risk for NSF.     Thank you for this consult.  Nephrology will continue to follow along with the primary team.  Please page the renal fellow on call with questions/concerns.    Patient was seen and evaluated by Dr. Eulah Pont who agrees with this assessment and plan.     Harland German, PGY-4  Clinical Fellow, Division of Nephrology and Hypertension  [place] [facility] [place]  Pager: [phone]    Requesting Attending Physician :  Maryclare Labrador, MD  Service Requesting Consult : Medical ICU (MDI)    Reason for Consult: AKI on CKD    HISTORY OF PRESENT ILLNESS: Katherine Norton is a 62 y.o. 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     History obtained from chart review.   She initially presented to OSH on 05/20/18 due to acute worsening shortness of breath and altered mental status.  Her medical course was complicated by AKI and progression respiratory failure which eventually required intubation.  She was transferred here for concern for duodenal perforation however that was rule out on careful look at the imaging.    Since transfer to the ICU she has been ventilator dependent, manifested by need for reintubation on 8/13.    Medications:   ???  calcitonin (salmon)  1 spray Alternating Nares Daily (RT)   ??? Cefepime  1 g Intravenous Q12H   ??? chlorhexidine  5 mL Mouth BID   ??? [START ON 06/09/2018] cycloSPORINE  50 mg NG tube Daily    And   ??? cycloSPORINE  25 mg NG tube Nightly (2000)   ??? esomeprazole  40 mg NG tube daily   ??? heparin (porcine) for subcutaneous use  5,000 Units Subcutaneous [facility]   ??? linezolid  600 mg Intravenous Q12H [facility]   ??? oxyCODONE  10 mg NG tube [facility]   ??? sodium bicarbonate  650 mg NG tube 4x Daily ALLERGIES  Iodinated contrast media; Ioxaglate sodium; and Latex    MEDICAL HISTORY  Past Medical History:   Diagnosis Date   ??? Chronic kidney disease    ??? Cirrhosis (CMS-HCC)    ??? HTN (hypertension)         SOCIAL HISTORY  Social History     Socioeconomic History   ??? Marital status: Single     Spouse name: Not on file   ??? Number of children: Not on file   ??? Years of education: Not on file   ??? Highest education level: Not on file   Occupational History   ??? Not on file   Social Needs   ??? Financial resource strain: Not on file   ??? Food insecurity:     Worry: Not on file     Inability: Not on file   ??? Transportation needs:     Medical: Not on file     Non-medical: Not on file   Tobacco Use   ??? Smoking status: Never Smoker   ??? Smokeless tobacco: Never Used   Substance and Sexual Activity   ??? Alcohol use: No     Alcohol/week: 0.0 standard drinks   ??? Drug use: No   ??? Sexual activity: Not on file   Lifestyle   ??? Physical activity:     Days per week: Not on file     Minutes per session: Not on file   ??? Stress: Not on file   Relationships   ??? Social connections:     Talks on phone: Not on file     Gets together: Not on file     Attends religious service: Not on file     Active member of club or organization: Not on file     Attends meetings of clubs or organizations: Not on file     Relationship status: Not on file   Other Topics Concern   ??? Not on file   Social History Narrative   ??? Not on file        FAMILY HISTORY  Family History   Problem Relation Age of Onset   ??? Hypertension Mother    ??? Hypertension Father    ??? Prostate cancer Father    ??? Kidney disease Neg Hx    ??? Lupus Sister    ??? Lupus Brother         Code Status:  Full Code    Review of Systems:  Review of systems was unobtainable due to patient factors. Patient intubated and sedated.       Physical Exam:  Vitals:    06/08/18 1717   BP:    Pulse: 108   Resp: 24   Temp:    SpO2: 96%     I/O this shift:  In: 637 [I.V.:177; NG/GT:360; IV Piggyback:100]  Out:  190 [Urine:190]    Intake/Output Summary (Last 24 hours) at 06/08/2018 1807  Last data filed at 06/08/2018 1600  Gross per 24 hour   Intake 1656.81 ml   Output 540 ml   Net 1116.81 ml       General: intubated, sedated  HeaD: NCAT  Eyes: eyes closed   ENT:  ET tube in place  CV: RRR, no m/r/g, no edema  Pulm: ventilated, clear anteriorly  GI: soft, non-tender, non-distended  Skin: no visible lesions or rashes  Neuro: sedated, no purposeful movements during my exam    LABS    Creatinine Trend  Lab Results   Component Value Date    CREATININE 3.76 (H) 06/08/2018    CREATININE 3.36 (H) 06/07/2018    CREATININE 3.19 (H) 06/06/2018    CREATININE 3.19 (H) 06/06/2018    CREATININE 2.97 (H) 06/05/2018    CREATININE 2.98 (H) 06/05/2018    CREATININE 2.90 (H) 06/04/2018    CREATININE 2.87 (H) 06/03/2018    CREATININE 2.89 (H) 06/02/2018    CREATININE 2.32 (H) 04/05/2018    CREATININE 2.23 (H) 03/09/2018    CREATININE 2.02 (H) 01/15/2018    CREATININE 1.96 (H) 01/12/2018    CREATININE 1.66 12/05/2017    CREATININE 1.91 (H) 11/14/2017    CREATININE 1.67 (H) 07/14/2017    CREATININE 1.56 (H) 06/23/2017    CREATININE 1.49 (H) 03/15/2017    CREATININE 1.46 (H) 02/16/2017    CREATININE 2.49 (H) 12/26/2016    CREATININE 1.53 (H) 12/16/2016    CREATININE 1.92 (H) 08/19/2016    CREATININE 1.78 (H) 06/16/2016    CREATININE 1.59 (H) 04/25/2016    CREATININE 1.79 (H) 04/01/2016    CREATININE 1.69 (H) 03/24/2016    CREATININE 1.80 (H) 02/02/2016    CREATININE 1.91 (H) 12/07/2015    CREATININE 1.38 (H) 11/24/2015    CREATININE 2.06 (H) 10/07/2015    CREATININE 1.85 (H) 09/03/2015    CREATININE 2.72 (H) 07/24/2015    CREATININE 1.62 (H) 05/22/2015    CREATININE 2.31 (H) 04/15/2015        Lab Results   Component Value Date    COLORU Yellow 06/07/2018    COLORU Yellow 08/09/2002    SPECGRAV 1.015 06/07/2018    SPECGRAV 1.014 08/09/2002    PHUR 5.5 06/07/2018    PHUR 5.0 08/09/2002    PROTEINUA 30 mg/dL (A) 16/07/9603    PROTEINUA NEG 08/09/2002 GLUCOSEU Negative 06/07/2018    GLUCOSEU NEG 08/09/2002    KETONESU 20 mg/dL (A) 54/06/8118    KETONESU NEG 08/09/2002    BLOODU Small (A) 06/07/2018    BLOODU NEG 08/09/2002    NITRITE Negative 06/07/2018    NITRITE NEG 08/09/2002    LEUKOCYTESUR Small (A) 06/07/2018    LEUKOCYTESUR NEG 08/09/2002    UROBILINOGEN 0.2 mg/dL 14/78/2956    UROBILINOGEN NORM 08/09/2002    BILIRUBINUR Negative 06/07/2018    BILIRUBINUR NEG 08/09/2002       Lab Results   Component Value Date    CREATUR 78.8 12/04/2014    PCRATIOUR 0.190 12/04/2014        No results found for: NAPA, NAUR     Lab Results   Component Value Date    NA 141 06/08/2018    NA 142 06/06/2018    K 3.7 06/08/2018    K 3.7 06/06/2018    CL 114 (H) 06/08/2018    CL 104 04/05/2018    CO2 18.0 (L) 06/08/2018    CO2 21 04/05/2018  BUN 96 (H) 06/08/2018    BUN 30 (H) 04/05/2018    CREATININE 3.76 (H) 06/08/2018    CREATININE 2.32 (H) 04/05/2018     Lab Results   Component Value Date    CALCIUM 8.5 06/08/2018    CALCIUM 9.2 04/05/2018    MG 1.8 06/08/2018    MG 2.0 04/05/2018    PHOS 7.5 (H) 06/08/2018    PHOS 4.5 12/04/2014    ALBUMIN 2.7 (L) 06/08/2018    ALBUMIN 3.9 12/04/2014        Lab Results   Component Value Date    WBC 5.2 06/08/2018    WBC 3.3 (L) 04/05/2018    HGB 8.0 (L) 06/08/2018    HGB 7.2 (L) 06/06/2018    HCT 24.9 (L) 06/08/2018    HCT 30.8 (L) 04/05/2018    PLT 113 (L) 06/08/2018    PLT 163 04/05/2018        IMAGING STUDIES:  Reviewed.

## 2018-06-09 NOTE — Unmapped (Signed)
Patient remains on ventilator. Settings are [facility] 350/24/14/30. No changes with Patient's plan of care today.

## 2018-06-09 NOTE — Unmapped (Signed)
MICU Progress Note     Date of Service: 06/09/2018    Problem List:       Interval history: Katherine Norton is a 62 y.o. female with  PMHx of liver transplant (1999) on immunosuppresion, chronic back pain, anxiety, HTN, CKD,  that presents to [facility] as a transfer from [facility], with respiratory failure and possible contained duodenal perforation.      24hr events: Worsening Cr, continued pressor requirement. Some family discussions and goc talks today with her sister. Plan to see how she does over the next few days before making any decisions. Failed SBT today.    Neurological   #acute on chronic pain/sedation  - low dose propofol gtt   - add oxy 10 mg scheduled every 8 hrs    #acute encephalopathy: Resolved-  presented to OSH altered, but also hypoxic at that time. Currently [facility]    Pulmonary   #acute hypoxic respiratory failure likely due to multifocal PNA.  Intubated on 8/1 CT chest with Scattered airspace opacities in the left apex, left upper lobe and superior segment of left lower lobe; concerning for pneumonia.  Currently on PRVC 12/300/60% +8 PEEP with most recent ABG 7.24/46/99/97%.  Will adjust minute ventilation for resp acidosis and wean Fi02.    -sedation as needed for vent compliance.    8/12: Plan SBT once sedation is lowered.  May need diuresis as well. Extubated, followed by progressively worsening respiratory failure  8/13: Re-intubated early morning, weaning vent. Bronch completed, culture and cell count sent.  8/14: Will place esophogeal balloon to figure out optimal PEEP. Optimal PEEP found to be 14    Cardiovascular   #h/o hypertension: hold home metoprolol and amlodipine as recently off pressor  - prn hydralazine    Echo 7/29: EF 65-70%, mod pulm HTN and borderline dilated RV   - repeat echo still pending    #Hypotension:  - developed pressor requirement 8/15  - Continue norepi gtt for MAP>65    Renal   #Acute on chronic renal failure  - Cr baseline appears to be ~1.7--> 3-->2.87 - Was on diuresis regimen at OSH, but was net pos due to enteral feeds  - Trend and may require CRRT or diuresis regimen  - Trial of 40mg  lasix 8/13 w/ good output but decent bump in Cr. Will hold further diuresis today  - Cr continues to rise  - nephrology consulted 8/16, appreciate recs    Infectious Disease/Autoimmune   #septic shock due to ? PNA.  Initially seen at OSH and had c/f perforated duodenal ulcer, but CT scan here non concerning.  CT chest with possible multifocal PNA.    - appreciate ID's assistance.  - S/p bronch 8/13 w/ no growth  - UA, blood cultures and cxr ordered for new pressor requirement.    Abx:   Vanco (8/11-->8/12)  Mica (8/11-->8/12)  Flagyl (8/10-->8/14)  Cefepime  (8/10-->)  Voriconazole (8/12-->8/15)  Linezolid (8/15- )    Cx:  8/11: fungal neg  8/11: LRC: pending  8/10: blood cx pending  8/10: BAL: pending    Abx: OSH  ceftaroline and flagyl  levaquin  meropenem     Cx OSH:  - 7/29 Strep pneumo Ag neg, RVP neg, urine Legionella Ag neg  - 7/29 bd cx NGTD  OSH: Fungal, pneumocystis/histoplasma, AFB, Gram on BAL all were negative.  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  Per surgery, NO perf.      #abd pain: pt endorsing abd pain.  Will check lipase.  Start TF if no plans to extubate.     Malnutrition Assessment: Not done yet.   - TF running      Heme/Coag   Acute blood loss- no s/s of bleeding  - Hgb drop to 5.8 at OSH with transfusion  - cbc stable.    - DVT ppx held due to drop in h/h.  Restarted.      Endocrine   nai    Prophylaxis/LDA/Restraints/Consults   Can CVC be removed? No: need for medications requiring central access (e.g. pressors)   Can A-line be removed? No: frequent ABGs  Can Foley be removed? No: Need continuous I/O  Mobility plan: Step 1 - Range of motion    Feeding: Trickle feeds, advance as tolerated  Analgesia: Pain adequately controlled  Sedation SAT/SBT: No PEEP > 8  Thrombembolic ppx: SQ heparin  Head of bed: >30 deg  Ulcer ppx: Yes, mechanical ventilation > 3 days, not on tube feeds  Glucose within target range: Yes, in range    RASS at goal? Yes  [place] Agitation Assessment Scale (RASS) : -2 (06/09/2018  4:00 PM)     Can antipsychotics be stopped? N/A, not on antipsychotics  CAM-ICU Result: Positive (06/09/2018  7:27 AM)      Patient Lines/Drains/Airways Status    Active Active Lines, Drains, & Airways     Name:   Placement date:   Placement time:   Site:   Days:    ETT  7.5   06/05/18    0037     4    CVC Triple Lumen 06/03/18 Non-tunneled Right Internal jugular   06/03/18    1503    Internal jugular   6    NG/OG Tube Right nostril   06/02/18    2121    Right nostril   6    Urethral Catheter   06/02/18    2118    ???   6    Peripheral IV 06/02/18 Posterior;Left Forearm   06/02/18    2120    Forearm   6    Arterial Line 06/03/18 Right Radial   06/03/18    0007    Radial   6              Patient Lines/Drains/Airways Status    Active Wounds     Name:   Placement date:   Placement time:   Site:   Days:    Wound 06/08/18 Pressure Injury Buttocks Posterior pink Stage 1   06/08/18    0001    Buttocks   1                Goals of Care     Code Status: Full Code    Designated Healthcare Decision Maker:  Ms. Witherspoon currently lacks decisional capacity for healthcare decision-making and is unable to designate a surrogate healthcare decision maker. Ms. Lorenzetti designated healthcare decision maker(s) is/are clois montavon and [name] (the patient's adult child) as denoted by hospital policy for patients without a known preference.      Subjective   Katherine Norton is a 62 y.o. female with PMHx of liver transplant (  1999) on immunosuppresion, chronic back pain, anxiety, HTN, CKD,  that presents to [facility] as a transfer from [facility]. Patient initially presented to the OSH on 05/20/18 due to acute worsening shortness of breath and altered mental status. Patient was sating 68% on RA, but 90% on CPAP. Patient had severe acidemia on ABG as well as acute on chronic renal failure with Cr of 3.89. CXR at that time showed consolidation with concern for pneumonia and was febrile. Patient's initial lactate was 2.70. Patient was started on IV ceftaroline and flagyl initially.      Objective     Vitals - past 24 hours  Temp:  [37.9 ??C (100.2 ??F)] 37.9 ??C (100.2 ??F)  Heart Rate:  [100-111] 105  SpO2 Pulse:  [100-111] 105  Resp:  [14-28] 24  FiO2 (%):  [30 %] 30 %  SpO2:  [92 %-100 %] 95 % Intake/Output  I/O last 3 completed shifts:  In: 2742.6 [I.V.:762.6; NG/GT:1480; IV Piggyback:500]  Out: 670 [Urine:570; Stool:100]     Physical Exam:    General: obese female, awakens to stimuli, nods appropriately  HEENT: PERRLA  CV: RRR, no mrg  Pulm: coarse bs bilaterally  GI: soft, distended, non-tender  MSK: no edema  Skin: intact  Neuro: awake, FC, mae      Continuous Infusions:   ??? norepinephrine bitartrate-NS 1 mcg/min (06/09/18 1600)   ??? propofol 10 mcg/kg/min (06/09/18 1600)       Scheduled Medications:   ??? calcitonin (salmon)  1 spray Alternating Nares Daily (RT)   ??? chlorhexidine  5 mL Mouth BID   ??? cycloSPORINE  50 mg NG tube Daily    And   ??? cycloSPORINE  25 mg NG tube Nightly (2000)   ??? esomeprazole  40 mg NG tube daily   ??? heparin (porcine) for subcutaneous use  5,000 Units Subcutaneous [facility]   ??? oxyCODONE  10 mg NG tube [facility]   ??? sodium bicarbonate  650 mg NG tube 4x Daily       PRN medications:      Data/Imaging Review: Reviewed in Epic and personally interpreted on 06/09/2018. See EMR for detailed results.     .  Critical Care Attestation     Critical Care Time:   The patient was critically ill during the time that I saw and cared for the patient.    I independently spent 35 minutes in critical care time (excluding procedures) which requires high complexity decision making for assessment and support including: reviewing the history, examining the patient, evaluating the hemodynamic, laboratory, and radiographic data, application of advanced monitoring technologies and extensive interpretation of multiple databases, titration of therapies to develop a comprehensive management plan, and serially assessing the patient's response to our critical care interventions.

## 2018-06-09 NOTE — Unmapped (Signed)
Norepi titrated to keep Map > 65. Minimal uo md aware. Sbt held per resp due to peep.      Problem: Adult Inpatient Plan of Care  Goal: Plan of Care Review  06/09/2018 0720 by Noland Fordyce, RN  Outcome: Progressing  06/09/2018 0145 by Noland Fordyce, RN  Outcome: Progressing  Goal: Patient-Specific Goal (Individualization)  06/09/2018 0720 by Noland Fordyce, RN  Outcome: Progressing  06/09/2018 0145 by Noland Fordyce, RN  Outcome: Progressing  Goal: Absence of Hospital-Acquired Illness or Injury  06/09/2018 0720 by Noland Fordyce, RN  Outcome: Progressing  06/09/2018 0145 by Noland Fordyce, RN  Outcome: Progressing  Goal: Optimal Comfort and Wellbeing  06/09/2018 0720 by Noland Fordyce, RN  Outcome: Progressing  06/09/2018 0145 by Noland Fordyce, RN  Outcome: Progressing  Goal: Readiness for Transition of Care  06/09/2018 0720 by Noland Fordyce, RN  Outcome: Progressing  06/09/2018 0145 by Noland Fordyce, RN  Outcome: Progressing  Goal: Rounds/Family Conference  06/09/2018 0720 by Noland Fordyce, RN  Outcome: Progressing  06/09/2018 0145 by Noland Fordyce, RN  Outcome: Progressing     Problem: Latex Allergy  Goal: Absence of Allergy Symptoms  06/09/2018 0720 by Noland Fordyce, RN  Outcome: Progressing  06/09/2018 0145 by Noland Fordyce, RN  Outcome: Progressing     Problem: Skin Injury Risk Increased  Goal: Skin Health and Integrity  06/09/2018 0720 by Noland Fordyce, RN  Outcome: Progressing  06/09/2018 0145 by Noland Fordyce, RN  Outcome: Progressing     Problem: Fall Injury Risk  Goal: Absence of Fall and Fall-Related Injury  06/09/2018 0720 by Noland Fordyce, RN  Outcome: Progressing  06/09/2018 0145 by Noland Fordyce, RN  Outcome: Progressing     Problem: Communication Impairment (Mechanical Ventilation, Invasive)  Goal: Effective Communication  06/09/2018 0720 by Noland Fordyce, RN  Outcome: Progressing  06/09/2018 0145 by Noland Fordyce, RN  Outcome: Progressing Problem: Device-Related Complication Risk (Mechanical Ventilation, Invasive)  Goal: Optimal Device Function  06/09/2018 0720 by Noland Fordyce, RN  Outcome: Progressing  06/09/2018 0145 by Noland Fordyce, RN  Outcome: Progressing     Problem: Inability to Wean (Mechanical Ventilation, Invasive)  Goal: Mechanical Ventilation Liberation  06/09/2018 0720 by Noland Fordyce, RN  Outcome: Progressing  06/09/2018 0145 by Noland Fordyce, RN  Outcome: Progressing     Problem: Nutrition Impairment (Mechanical Ventilation, Invasive)  Goal: Optimal Nutrition Delivery  06/09/2018 0720 by Noland Fordyce, RN  Outcome: Progressing  06/09/2018 0145 by Noland Fordyce, RN  Outcome: Progressing     Problem: Skin and Tissue Injury (Mechanical Ventilation, Invasive)  Goal: Absence of Device-Related Skin and Tissue Injury  06/09/2018 0720 by Noland Fordyce, RN  Outcome: Progressing  06/09/2018 0145 by Noland Fordyce, RN  Outcome: Progressing     Problem: Ventilator-Induced Lung Injury (Mechanical Ventilation, Invasive)  Goal: Absence of Ventilator-Induced Lung Injury  06/09/2018 0720 by Noland Fordyce, RN  Outcome: Progressing  06/09/2018 0145 by Noland Fordyce, RN  Outcome: Progressing     Problem: Self-Care Deficit  Goal: Improved Ability to Complete Activities of Daily Living  06/09/2018 0720 by Noland Fordyce, RN  Outcome: Progressing  06/09/2018 0145 by Noland Fordyce, RN  Outcome: Progressing     Problem: Hypertension Comorbidity  Goal: Blood Pressure in Desired Range  06/09/2018 0720 by Noland Fordyce, RN  Outcome: Progressing  06/09/2018 0145 by Noland Fordyce, RN  Outcome: Progressing     Problem: Pain Chronic (Persistent) (Comorbidity Management)  Goal: Acceptable Pain Control and Functional Ability  06/09/2018 0720 by Noland Fordyce, RN  Outcome: Progressing  06/09/2018 0145 by Noland Fordyce, RN  Outcome: Progressing     Problem: Fluid Imbalance (Pneumonia)  Goal: Fluid Balance  06/09/2018 0720 by Noland Fordyce, RN  Outcome: Progressing  06/09/2018 0145 by Noland Fordyce, RN  Outcome: Progressing     Problem: Infection (Pneumonia)  Goal: Resolution of Infection Signs/Symptoms  06/09/2018 0720 by Noland Fordyce, RN  Outcome: Progressing  06/09/2018 0145 by Noland Fordyce, RN  Outcome: Progressing     Problem: Respiratory Compromise (Pneumonia)  Goal: Effective Oxygenation and Ventilation  06/09/2018 0720 by Noland Fordyce, RN  Outcome: Progressing  06/09/2018 0145 by Noland Fordyce, RN  Outcome: Progressing

## 2018-06-09 NOTE — Unmapped (Signed)
Remains sedated rass -2 withdraws to pain opens eyes to pain turned side to side q2hrs remains on vent O2 sats > 90 suct q2 to 4hrs for small amt secretions UO remains marginal via foley remains on norepinephrine with MAP > 65 afebrile  tol TF at goal will update family when in to visit or call  Problem: Adult Inpatient Plan of Care  Goal: Plan of Care Review  Outcome: Progressing  Goal: Patient-Specific Goal (Individualization)  Outcome: Progressing  Goal: Absence of Hospital-Acquired Illness or Injury  Outcome: Progressing  Goal: Optimal Comfort and Wellbeing  Outcome: Progressing  Goal: Readiness for Transition of Care  Outcome: Progressing  Goal: Rounds/Family Conference  Outcome: Progressing     Problem: Latex Allergy  Goal: Absence of Allergy Symptoms  Outcome: Progressing     Problem: Skin Injury Risk Increased  Goal: Skin Health and Integrity  Outcome: Progressing     Problem: Fall Injury Risk  Goal: Absence of Fall and Fall-Related Injury  Outcome: Progressing     Problem: Communication Impairment (Mechanical Ventilation, Invasive)  Goal: Effective Communication  Outcome: Progressing     Problem: Device-Related Complication Risk (Mechanical Ventilation, Invasive)  Goal: Optimal Device Function  Outcome: Progressing     Problem: Inability to Wean (Mechanical Ventilation, Invasive)  Goal: Mechanical Ventilation Liberation  Outcome: Progressing     Problem: Nutrition Impairment (Mechanical Ventilation, Invasive)  Goal: Optimal Nutrition Delivery  Outcome: Progressing     Problem: Skin and Tissue Injury (Mechanical Ventilation, Invasive)  Goal: Absence of Device-Related Skin and Tissue Injury  Outcome: Progressing     Problem: Ventilator-Induced Lung Injury (Mechanical Ventilation, Invasive)  Goal: Absence of Ventilator-Induced Lung Injury  Outcome: Progressing     Problem: Self-Care Deficit  Goal: Improved Ability to Complete Activities of Daily Living  Outcome: Progressing     Problem: Hypertension Comorbidity  Goal: Blood Pressure in Desired Range  Outcome: Progressing     Problem: Pain Chronic (Persistent) (Comorbidity Management)  Goal: Acceptable Pain Control and Functional Ability  Outcome: Progressing     Problem: Fluid Imbalance (Pneumonia)  Goal: Fluid Balance  Outcome: Progressing     Problem: Infection (Pneumonia)  Goal: Resolution of Infection Signs/Symptoms  Outcome: Progressing     Problem: Respiratory Compromise (Pneumonia)  Goal: Effective Oxygenation and Ventilation  Outcome: Progressing

## 2018-06-09 NOTE — Unmapped (Signed)
I performed an SBT per MD request at 12:50. SBT lasted about 10 minutes The ventilator was placed on 14/5. Patient's respiratory rate increased to 40-50 range. Patient's tidal volume decreased to 173. SPO2 decreased to 92%. Patient was placed back on pervious ventilator settings 350/24/+14/30%. Patient's respiratory rate decreased back to 24, Patient's tidal volume increased to 341 and Patient's SPO2 increased to 98%. Will continue to monitor.

## 2018-06-10 LAB — CBC
HEMATOCRIT: 23.8 % — ABNORMAL LOW (ref 36.0–46.0)
MEAN CORPUSCULAR HEMOGLOBIN CONC: 31.4 g/dL (ref 31.0–37.0)
MEAN CORPUSCULAR HEMOGLOBIN: 31.2 pg (ref 26.0–34.0)
MEAN CORPUSCULAR VOLUME: 99.4 fL (ref 80.0–100.0)
MEAN PLATELET VOLUME: 11.6 fL — ABNORMAL HIGH (ref 7.0–10.0)
PLATELET COUNT: 75 10*9/L — ABNORMAL LOW (ref 150–440)
RED BLOOD CELL COUNT: 2.39 10*12/L — ABNORMAL LOW (ref 4.00–5.20)
RED CELL DISTRIBUTION WIDTH: 17.7 % — ABNORMAL HIGH (ref 12.0–15.0)
WBC ADJUSTED: 5.5 10*9/L (ref 4.5–11.0)

## 2018-06-10 LAB — COMPREHENSIVE METABOLIC PANEL
ALBUMIN: 2.6 g/dL — ABNORMAL LOW (ref 3.5–5.0)
ALKALINE PHOSPHATASE: 84 U/L (ref 38–126)
ALT (SGPT): 20 U/L (ref 15–48)
ANION GAP: 18 mmol/L — ABNORMAL HIGH (ref 9–15)
AST (SGOT): 24 U/L (ref 14–38)
BLOOD UREA NITROGEN: 99 mg/dL — ABNORMAL HIGH (ref 7–21)
BUN / CREAT RATIO: 21
CALCIUM: 8.4 mg/dL — ABNORMAL LOW (ref 8.5–10.2)
CHLORIDE: 105 mmol/L (ref 98–107)
CREATININE: 4.79 mg/dL — ABNORMAL HIGH (ref 0.60–1.00)
EGFR CKD-EPI AA FEMALE: 10 mL/min/{1.73_m2} — ABNORMAL LOW (ref >=60)
EGFR CKD-EPI NON-AA FEMALE: 9 mL/min/{1.73_m2} — ABNORMAL LOW (ref >=60)
GLUCOSE RANDOM: 138 mg/dL (ref 65–179)
POTASSIUM: 3.6 mmol/L (ref 3.5–5.0)
PROTEIN TOTAL: 6.2 g/dL — ABNORMAL LOW (ref 6.5–8.3)
SODIUM: 140 mmol/L (ref 135–145)

## 2018-06-10 LAB — MAGNESIUM: Magnesium:MCnc:Pt:Ser/Plas:Qn:: 1.9

## 2018-06-10 LAB — CALCIUM: Calcium:MCnc:Pt:Ser/Plas:Qn:: 8.4 — ABNORMAL LOW

## 2018-06-10 LAB — WBC ADJUSTED: Lab: 5.5

## 2018-06-10 LAB — PHOSPHORUS: Phosphate:MCnc:Pt:Ser/Plas:Qn:: 7.5 — ABNORMAL HIGH

## 2018-06-10 NOTE — Unmapped (Signed)
Patient remains intubated on vent, no changes made, prvc 350 rate 24 peep 14 30%, had desat episode when turning, but recovered quickly with o2 breath.

## 2018-06-10 NOTE — Unmapped (Signed)
Withdraws to pain opens eyes to voice remains on propofol and norepi to keep MAP >65 turned side to side q2hrs HOB at 30 degrees UO minimal stool per FMS tol TF at goal MD to speak with family today  Problem: Adult Inpatient Plan of Care  Goal: Plan of Care Review  Outcome: Progressing  Goal: Patient-Specific Goal (Individualization)  Outcome: Progressing  Goal: Absence of Hospital-Acquired Illness or Injury  Outcome: Progressing  Goal: Optimal Comfort and Wellbeing  Outcome: Progressing  Goal: Readiness for Transition of Care  Outcome: Progressing  Goal: Rounds/Family Conference  Outcome: Progressing     Problem: Latex Allergy  Goal: Absence of Allergy Symptoms  Outcome: Progressing     Problem: Skin Injury Risk Increased  Goal: Skin Health and Integrity  Outcome: Progressing     Problem: Fall Injury Risk  Goal: Absence of Fall and Fall-Related Injury  Outcome: Progressing     Problem: Communication Impairment (Mechanical Ventilation, Invasive)  Goal: Effective Communication  Outcome: Progressing     Problem: Device-Related Complication Risk (Mechanical Ventilation, Invasive)  Goal: Optimal Device Function  Outcome: Progressing     Problem: Inability to Wean (Mechanical Ventilation, Invasive)  Goal: Mechanical Ventilation Liberation  Outcome: Progressing     Problem: Nutrition Impairment (Mechanical Ventilation, Invasive)  Goal: Optimal Nutrition Delivery  Outcome: Progressing     Problem: Skin and Tissue Injury (Mechanical Ventilation, Invasive)  Goal: Absence of Device-Related Skin and Tissue Injury  Outcome: Progressing     Problem: Ventilator-Induced Lung Injury (Mechanical Ventilation, Invasive)  Goal: Absence of Ventilator-Induced Lung Injury  Outcome: Progressing     Problem: Self-Care Deficit  Goal: Improved Ability to Complete Activities of Daily Living  Outcome: Progressing     Problem: Hypertension Comorbidity  Goal: Blood Pressure in Desired Range  Outcome: Progressing     Problem: Pain Chronic (Persistent) (Comorbidity Management)  Goal: Acceptable Pain Control and Functional Ability  Outcome: Progressing     Problem: Fluid Imbalance (Pneumonia)  Goal: Fluid Balance  Outcome: Progressing     Problem: Infection (Pneumonia)  Goal: Resolution of Infection Signs/Symptoms  Outcome: Progressing     Problem: Respiratory Compromise (Pneumonia)  Goal: Effective Oxygenation and Ventilation  Outcome: Progressing

## 2018-06-10 NOTE — Unmapped (Signed)
MICU Progress Note     Date of Service: 06/10/2018    Problem List:       Interval history: Katherine Norton is a 62 y.o. female with  PMHx of liver transplant (1999) on immunosuppresion, chronic back pain, anxiety, HTN, CKD,  that presents to [facility] as a transfer from [facility], with respiratory failure and possible contained duodenal perforation.      24hr events: Worsening Cr, remains on low dose norepi    Neurological   #acute on chronic pain/sedation  - low dose propofol gtt   - add oxy 10 mg scheduled every 8 hrs    #acute encephalopathy: Resolved-  presented to OSH altered, but also hypoxic at that time. Currently [facility]    Pulmonary   #acute hypoxic respiratory failure likely due to multifocal PNA.  Intubated on 8/1 CT chest with Scattered airspace opacities in the left apex, left upper lobe and superior segment of left lower lobe; concerning for pneumonia.  Currently on PRVC 12/300/60% +8 PEEP with most recent ABG 7.24/46/99/97%.  Will adjust minute ventilation for resp acidosis and wean Fi02.    -sedation as needed for vent compliance.    8/12: Plan SBT once sedation is lowered.  May need diuresis as well. Extubated, followed by progressively worsening respiratory failure  8/13: Re-intubated early morning, weaning vent. Bronch completed, culture and cell count sent.  8/14: Will place esophogeal balloon to figure out optimal PEEP. Optimal PEEP found to be 14    Cardiovascular   #h/o hypertension: hold home metoprolol and amlodipine as recently off pressor  - prn hydralazine    Echo 7/29: EF 65-70%, mod pulm HTN and borderline dilated RV   - repeat echo shows some RH failure, consistent w/ volume overload picture    #Hypotension:  - developed pressor requirement 8/15  - Continue norepi gtt for MAP>65    Renal   #Acute on chronic renal failure  - Cr baseline appears to be ~1.7--> 3-->2.87  - Was on diuresis regimen at OSH, but was net pos due to enteral feeds  - Trend and may require CRRT or diuresis regimen  - Trial of 40mg  lasix 8/13 w/ good output but decent bump in Cr. Will hold further diuresis   - Cr continues to rise  - nephrology consulted 8/16, appreciate recs    Infectious Disease/Autoimmune   #septic shock due to ? PNA.  Initially seen at OSH and had c/f perforated duodenal ulcer, but CT scan here non concerning.  CT chest with possible multifocal PNA.    - appreciate ID's assistance. ID signed off 8/17  - S/p bronch 8/13 w/ no growth  - UA, blood cultures and cxr ordered for new pressor requirement.    Abx:   Vanco (8/11-->8/12)  Mica (8/11-->8/12)  Flagyl (8/10-->8/14)  Cefepime  (8/10-->)  Voriconazole (8/12-->8/15)  Linezolid (8/15- )    Cx:  8/11: fungal neg  8/11: LRC: pending  8/10: blood cx pending  8/10: BAL: pending    Abx: OSH  ceftaroline and flagyl  levaquin  meropenem     Cx OSH:  - 7/29 Strep pneumo Ag neg, RVP neg, urine Legionella Ag neg  - 7/29 bd cx NGTD  OSH: Fungal, pneumocystis/histoplasma, AFB, Gram on BAL all were negative.  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  Per surgery, NO perf.      #abd pain: pt endorsing abd pain.  Will check lipase.  Start TF if no plans to extubate.     Malnutrition Assessment: Not done yet.   - TF running      Heme/Coag   Acute blood loss- no s/s of bleeding  - Hgb drop to 5.8 at OSH with transfusion  - cbc stable.    - DVT ppx held due to drop in h/h.  Restarted.      Endocrine   nai    Prophylaxis/LDA/Restraints/Consults   Can CVC be removed? No: need for medications requiring central access (e.g. pressors)   Can A-line be removed? No: frequent ABGs  Can Foley be removed? No: Need continuous I/O  Mobility plan: Step 1 - Range of motion    Feeding: Tube feeds at goal  Analgesia: Pain adequately controlled  Sedation SAT/SBT: Yes  Thrombembolic ppx: SQ heparin  Head of bed: >30 deg  Ulcer ppx: Yes, mechanical ventilation > 3 days, not on tube feeds  Glucose within target range: Yes, in range    RASS at goal? Yes  [place] Agitation Assessment Scale (RASS) : -2 (06/10/2018  7:30 AM)     Can antipsychotics be stopped? N/A, not on antipsychotics  CAM-ICU Result: Positive (06/09/2018  8:00 PM)      Patient Lines/Drains/Airways Status    Active Active Lines, Drains, & Airways     Name:   Placement date:   Placement time:   Site:   Days:    ETT  7.5   06/05/18    0037     5    CVC Triple Lumen 06/03/18 Non-tunneled Right Internal jugular   06/03/18    1503    Internal jugular   6    NG/OG Tube Right nostril   06/02/18    2121    Right nostril   7    Urethral Catheter   06/02/18    2118    ???   7    Peripheral IV 06/02/18 Posterior;Left Forearm   06/02/18    2120    Forearm   7    Arterial Line 06/03/18 Right Radial   06/03/18    0007    Radial   7              Patient Lines/Drains/Airways Status    Active Wounds     Name:   Placement date:   Placement time:   Site:   Days:    Wound 06/08/18 Pressure Injury Buttocks Posterior pink Stage 1   06/08/18    0001    Buttocks   2    Wound 06/09/18 Rash/Dermititis Perineum   06/09/18    2154    Perineum   less than 1                Goals of Care     Code Status: Full Code    Designated Healthcare Decision Maker:  [name] currently lacks decisional capacity for healthcare decision-making and is unable to designate a surrogate healthcare decision maker. [name] designated healthcare decision maker(s) is/are Katherine Norton and Katherine Norton (the patient's adult child) as denoted by hospital policy for patients without a known preference.      Subjective   Katherine Norton is a 62 y.o. female  with PMHx of liver transplant (1999) on immunosuppresion, chronic back pain, anxiety, HTN, CKD,  that presents to [facility] as a transfer from [facility]. Patient initially presented to the OSH on 05/20/18 due to acute worsening shortness of breath and altered mental status. Patient was sating 68% on RA, but 90% on CPAP. Patient had severe acidemia on ABG as well as acute on chronic renal failure with Cr of 3.89. CXR at that time showed consolidation with concern for pneumonia and was febrile. Patient's initial lactate was 2.70. Patient was started on IV ceftaroline and flagyl initially.      Objective     Vitals - past 24 hours  Temp:  [36.6 ??C (97.9 ??F)-36.9 ??C (98.4 ??F)] 36.7 ??C (98 ??F)  Heart Rate:  [97-110] 109  SpO2 Pulse:  [98-110] 109  Resp:  [23-34] 30  FiO2 (%):  [30 %] 30 %  SpO2:  [93 %-100 %] 97 % Intake/Output  I/O last 3 completed shifts:  In: 2659.7 [I.V.:599.7; NG/GT:2060]  Out: 595 [Urine:345; Stool:250]     Physical Exam:    General: obese female, awakens to stimuli, nods appropriately  HEENT: PERRLA  CV: RRR, no mrg  Pulm: coarse bs bilaterally  GI: soft, distended, non-tender  MSK: no edema  Skin: intact  Neuro: awake, FC, mae      Continuous Infusions:   ??? norepinephrine bitartrate-NS 2 mcg/min (06/10/18 0825)   ??? propofol 10 mcg/kg/min (06/10/18 0800)       Scheduled Medications:   ??? calcitonin (salmon)  1 spray Alternating Nares Daily (RT)   ??? chlorhexidine  5 mL Mouth BID   ??? cycloSPORINE  50 mg NG tube Daily    And   ??? cycloSPORINE  25 mg NG tube Nightly (2000)   ??? esomeprazole  40 mg NG tube daily   ??? heparin (porcine) for subcutaneous use  5,000 Units Subcutaneous [facility]   ??? oxyCODONE  10 mg NG tube [facility]   ??? sodium bicarbonate  650 mg NG tube 4x Daily       PRN medications:      Data/Imaging Review: Reviewed in Epic and personally interpreted on 06/10/2018. See EMR for detailed results.     .  Critical Care Attestation     Critical Care Time:   The patient was critically ill during the time that I saw and cared for the patient.    I independently spent 30 minutes in critical care time (excluding procedures) which requires high complexity decision making for assessment and support including: reviewing the history, examining the patient, evaluating the hemodynamic, laboratory, and radiographic data, application of advanced monitoring technologies and extensive interpretation of multiple databases, titration of therapies to develop a comprehensive management plan, and serially assessing the patient's response to our critical care interventions.

## 2018-06-10 NOTE — Unmapped (Signed)
Patient has remained free from falls and injuries; she is being repositioned every 2 hours to prevent skin breakdown. All skin care provided during bath including sacral mepilex changed (wound cleansed); crusting MAD between skin folds and peri area with zinc paste; Patient has remained afebrile; MAP >65 with Norepi on pause since 0145. Prop weaned to RASS goal. SAT pending this AM. Patient is safe with bed locked and in low position. HOB elevated 30 degrees.

## 2018-06-11 LAB — COMPREHENSIVE METABOLIC PANEL
ALBUMIN: 2.6 g/dL — ABNORMAL LOW (ref 3.5–5.0)
ALKALINE PHOSPHATASE: 101 U/L (ref 38–126)
ALT (SGPT): 19 U/L (ref 15–48)
ANION GAP: 18 mmol/L — ABNORMAL HIGH (ref 9–15)
AST (SGOT): 25 U/L (ref 14–38)
BILIRUBIN TOTAL: 0.8 mg/dL (ref 0.0–1.2)
BLOOD UREA NITROGEN: 111 mg/dL — ABNORMAL HIGH (ref 7–21)
BUN / CREAT RATIO: 21
CALCIUM: 8.6 mg/dL (ref 8.5–10.2)
CHLORIDE: 104 mmol/L (ref 98–107)
CO2: 18 mmol/L — ABNORMAL LOW (ref 22.0–30.0)
EGFR CKD-EPI AA FEMALE: 9 mL/min/{1.73_m2} — ABNORMAL LOW (ref >=60)
EGFR CKD-EPI NON-AA FEMALE: 8 mL/min/{1.73_m2} — ABNORMAL LOW (ref >=60)
GLUCOSE RANDOM: 121 mg/dL (ref 65–179)
POTASSIUM: 3.8 mmol/L (ref 3.5–5.0)
PROTEIN TOTAL: 6.3 g/dL — ABNORMAL LOW (ref 6.5–8.3)

## 2018-06-11 LAB — BLOOD GAS CRITICAL CARE PANEL, ARTERIAL
CALCIUM IONIZED ARTERIAL (MG/DL): 4.71 mg/dL (ref 4.40–5.40)
FIO2 ARTERIAL: 30
GLUCOSE WHOLE BLOOD: 119 mg/dL
HCO3 ARTERIAL: 18 mmol/L — ABNORMAL LOW (ref 22–27)
HEMOGLOBIN BLOOD GAS: 7.9 g/dL — ABNORMAL LOW (ref 12.00–16.00)
LACTATE BLOOD ARTERIAL: 1 mmol/L (ref <=1.2)
O2 SATURATION ARTERIAL: 93.5 % — ABNORMAL LOW (ref 94.0–100.0)
PCO2 ARTERIAL: 35.4 mmHg (ref 35.0–45.0)
PO2 ARTERIAL: 73.1 mmHg — ABNORMAL LOW (ref 80.0–110.0)
POTASSIUM WHOLE BLOOD: 3.7 mmol/L (ref 3.4–4.6)
SODIUM WHOLE BLOOD: 142 mmol/L (ref 135–145)

## 2018-06-11 LAB — CBC
HEMATOCRIT: 23.4 % — ABNORMAL LOW (ref 36.0–46.0)
HEMOGLOBIN: 7.4 g/dL — ABNORMAL LOW (ref 12.0–16.0)
MEAN CORPUSCULAR HEMOGLOBIN CONC: 31.5 g/dL (ref 31.0–37.0)
MEAN CORPUSCULAR HEMOGLOBIN: 31.4 pg (ref 26.0–34.0)
PLATELET COUNT: 89 10*9/L — ABNORMAL LOW (ref 150–440)
RED BLOOD CELL COUNT: 2.34 10*12/L — ABNORMAL LOW (ref 4.00–5.20)
RED CELL DISTRIBUTION WIDTH: 17.8 % — ABNORMAL HIGH (ref 12.0–15.0)
WBC ADJUSTED: 5.4 10*9/L (ref 4.5–11.0)

## 2018-06-11 LAB — CYCLOSPORINE (FPIA) BLOOD: Lab: 61

## 2018-06-11 LAB — WBC ADJUSTED: Lab: 5.4

## 2018-06-11 LAB — BASE EXCESS ARTERIAL: Base excess:SCnc:Pt:BldA:Qn:Calculated: -6.7 — ABNORMAL LOW

## 2018-06-11 LAB — BILIRUBIN TOTAL: Bilirubin:MCnc:Pt:Ser/Plas:Qn:: 0.8

## 2018-06-11 LAB — MAGNESIUM: Magnesium:MCnc:Pt:Ser/Plas:Qn:: 2.1

## 2018-06-11 LAB — PHOSPHORUS: Phosphate:MCnc:Pt:Ser/Plas:Qn:: 8.1 — ABNORMAL HIGH

## 2018-06-11 NOTE — Unmapped (Signed)
WOCN Consult Services                                                                 Wound Evaluation     Reason for Consult:   - Moisture Associated Skin Damage  - Pressure Injury    Problem List:   Principal Problem:    Multifocal pneumonia  Active Problems:    CKD (chronic kidney disease)    Low back pain    Hypertension    Liver replaced by transplant cryptogenic    Generalized pain    Altered mental status    Acute-on-chronic kidney injury (CMS-HCC)    Assessment: Per medical record, Katherine Norton is a 62 y.o. female with ??PMHx of liver transplant (1999) on immunosuppresion, chronic back pain, anxiety, HTN, CKD,????that presents to [facility] as a transfer from [facility], with respiratory failure and possible contained duodenal perforation.      WOC RN consult for reported intertrigo under bilateral breasts, panniculus, and perineal area.  Skin under breasts is noted to be moist but intact.  Some superficial breakdown to skin under the panniculus.           06/11/18 1419   Wound 06/08/18 Pressure Injury Buttocks Posterior pink Stage 2   Date First Assessed/Time First Assessed: 06/08/18 0001   Present on Hospital Admission: No  Primary Wound Type: Pressure Injury  Location: Buttocks  Wound Location Orientation: Posterior  Wound Description (Comments): pink  Staging: Stage 2  Medical D...   Wound Image   MISLABELED:  THIS IS THE SACRUM   Wound Length (cm) 3 cm   Wound Width (cm) 2 cm   Wound Depth (cm) 0.01 cm   Wound Surface Area (cm^2) 6 cm^2   Wound Volume (cm^3) 0.06 cm^3   Wound Bed Red   Odor None   Exudate Type      Sero-sanguineous   Exudate Amnt      Scant   Tunneling      No   Undermining     No   Dressing Silicone foam bordered dressing     Continence Status:   Incontinence of bladder: Foley in place  Incontinent of bowel: Fecal management system in place    Moisture Associated Skin Damage:   - Intertrigo     Lab Results   Component Value Date    WBC 5.4 06/11/2018    HGB 7.4 (L) 06/11/2018    HCT 23.4 (L) 06/11/2018    ESR 45 (H) 01/17/2013    CRP <0.5 01/17/2013    A1C 5.0 07/04/2003    GLU 121 06/11/2018    ALBUMIN 2.6 (L) 06/11/2018    PROT 6.3 (L) 06/11/2018       Support Surface:   - Low Air Loss - ICU    Offloading:  Left: Pillow  Right: Pillow    Type Debridement Completed By WOCN:  N/A    Teaching:  - Moisture management  - Offloading  - Turning and repositioning  - Wound care    WOCN Recommendations:   - See nursing orders for wound care instructions.  - Contact WOCN with questions, concerns, or wound deterioration.     Alternate turning and repositioning patient to left and right sides. Avoid supine positioning  unless medically necessary. ??  Reposition/offload patient frequently when seated. Limit time in chair to 2 hours for pressure injuries on sitting surfaces.    Sacrum:  1. Cleanse wound with Normal Saline and 4 x 4 gauze, pat dry.  2. Apply mepilex border sacrum large silicone bordered foam ([id number]) to wound.   3. Change every other day/PRN if dislodged, soiled or saturated.    Under breasts and panniculus:  1. Remove Interdry Ag (skin fold management fabric) [id number]) daily to cleanse skin, cleanse with water and pat dry.  2. Apply antifungal powder in skin folds.   3. Dab or spray No Sting Barrier over the powder to seal it in.  4. Place Interdry Ag inside the skin fold allowing 2 of the fabric to extend outside the fold to allow moisture to evaporate from skin.    Skin Integrity Protocol:  1. Assess all at risk bony prominence for skin changes once per shift.  2. Turn and reposition patient every two hours while in bed.  3. Keep HOB ??no greater than 30 degrees.   4. Use foam chair cushion- [employer] [phone]) when OOB in chair/ reposition every hour while in chair.  5. For prevention, apply silicone bordered foam dressing to at risk bony prominence except for heels; change every other day and prn if soiled/not intact.   6. Keep heels elevated off bed with pillows placed length-wise under calves OR elevated off bed with Heel Offloading Boot [phone])    Topical Therapy/Interventions:   - Crusting (stoma powder or antifungal powder)  - Moisture wicking fabric with silver  - Silicone bordered foam    Recommended Consults:  - Currently followed by dietitian    WOCN Follow Up:  - Weekly    Plan of Care Discussed With:   - RN Vince    Supplies Ordered: No    Workup Time:   45 minutes     Cherylann Ratel MA BSN RN [facility]  [facility] Nurse Consult Service  Pager [phone]  Phone [phone]

## 2018-06-11 NOTE — Unmapped (Signed)
Norepi titrated for MAP>65. Propofol titrated for RASS of 0 to -2. Pt failed SAT d/t RR > 30 and agitation. UOP continues to be significantly diminished. Otherwise, all VSS. Tube feeds running at goal. Afebrile. Will continue to monitor.        Problem: Adult Inpatient Plan of Care  Goal: Plan of Care Review  Outcome: Ongoing - Unchanged  Flowsheets (Taken 06/11/2018 0244)  Progress: no change  Plan of Care Reviewed With: family  Goal: Patient-Specific Goal (Individualization)  Outcome: Ongoing - Unchanged  Flowsheets (Taken 06/11/2018 0244)  Patient-Specific Goals (Include Timeframe): UTA - pt intubated and sedated  Individualized Care Needs: Pt repositioned for comfort when agitated  Anxieties, Fears or Concerns: UTA - pt intubated and sedated  Goal: Absence of Hospital-Acquired Illness or Injury  Outcome: Ongoing - Unchanged  Note:   ongoing  Intervention: Identify and Manage Fall Risk  Flowsheets (Taken 06/10/2018 2000)  Safety Interventions: aspiration precautions;enteral feeding safety;environmental modification;fall reduction program maintained;lighting adjusted for tasks/safety;low bed  Intervention: Prevent Skin Injury  Flowsheets (Taken 06/10/2018 2000)  Pressure Reduction Techniques: weight shift assistance provided;heels elevated off bed;frequent weight shift encouraged  Intervention: Prevent VTE (venous thromboembolism)  Flowsheets (Taken 06/09/2018 2000 by Drinda Butts, RN)  VTE Prevention/Management: anticoagulation therapy  Intervention: Prevent Infection  Flowsheets (Taken 06/10/2018 2000)  Infection Prevention: cohorting utilized;environmental surveillance performed;handwashing promoted;equipment surfaces disinfected;personal protective equipment utilized;rest/sleep promoted;single patient room provided  Goal: Optimal Comfort and Wellbeing  Outcome: Ongoing - Unchanged  Intervention: Monitor Pain and Promote Comfort  Flowsheets (Taken 06/11/2018 0244)  Pain Management Interventions: care clustered; quiet environment facilitated; position adjusted  Intervention: Provide Person-Centered Care  Flowsheets (Taken 06/11/2018 0244)  Trust Relationship/Rapport: reassurance provided  Goal: Readiness for Transition of Care  Outcome: Ongoing - Unchanged  Intervention: Mutually Develop Transition Plan  Flowsheets (Taken 06/04/2018 1341 by Loistine Simas, RN)  Discharge Facility/Level of Care Needs: other (see comments) (back transfer to [facility])  Equipment Currently Used at Home: none  Anticipated Changes Related to Illness: inability to care for self  Concerns to be Addressed: adjustment to diagnosis/illness  Goal: Rounds/Family Conference  Outcome: Ongoing - Unchanged     Problem: Latex Allergy  Goal: Absence of Allergy Symptoms  Outcome: Ongoing - Unchanged  Note:   ongoing  Intervention: Maintain Latex-Aware Environment  Flowsheets (Taken 06/11/2018 0244)  Latex Precautions: latex precautions maintained     Problem: Skin Injury Risk Increased  Goal: Skin Health and Integrity  Outcome: Ongoing - Unchanged  Intervention: Optimize Skin Protection  Flowsheets (Taken 06/10/2018 2000)  Pressure Reduction Techniques: weight shift assistance provided;heels elevated off bed;frequent weight shift encouraged  Head of Bed (HOB): HOB at 30 degrees  Pressure Reduction Devices: pressure-redistributing mattress utilized;positioning supports utilized  Skin Protection: cleansing with dimethicone incontinence wipes;adhesive use limited;incontinence pads utilized;protective footwear used;pulse oximeter probe site changed;zinc oxide barrier cream;sacral silicone foam dressing in place  Intervention: Promote and Optimize Oral Intake  Flowsheets (Taken 06/11/2018 0244)  Oral Nutrition Promotion: calorie dense liquids provided     Problem: Fall Injury Risk  Goal: Absence of Fall and Fall-Related Injury  Outcome: Ongoing - Unchanged  Intervention: Identify and Manage Contributors to Fall Injury Risk  Flowsheets (Taken 06/11/2018 0244)  Medication Review/Management: medications reviewed; dosing adjusted; infusion titrated  Intervention: Promote Injury-Free Environment  Flowsheets  Taken 06/10/2018 2000  Safety Interventions: aspiration precautions;enteral feeding safety;environmental modification;fall reduction program maintained;lighting adjusted for tasks/safety;low bed  Taken 06/11/2018 0244  Environmental Safety Modification: clutter free environment maintained;lighting adjusted  Problem: Communication Impairment (Mechanical Ventilation, Invasive)  Goal: Effective Communication  Outcome: Ongoing - Unchanged     Problem: Device-Related Complication Risk (Mechanical Ventilation, Invasive)  Goal: Optimal Device Function  Outcome: Ongoing - Unchanged  Intervention: Optimize Device Care and Function  Flowsheets  Taken 06/10/2018 2000  Aspiration Precautions: upright posture maintained;tube feeding placement verified;respiratory status monitored  Taken 06/11/2018 0100  Airway Safety Measures: suction at bedside;manual resuscitator at bedside (Pended)     Problem: Inability to Wean (Mechanical Ventilation, Invasive)  Goal: Mechanical Ventilation Liberation  Outcome: Ongoing - Unchanged  Intervention: Promote Extubation and Mechanical Ventilation Liberation  Flowsheets (Taken 06/11/2018 0244)  Environmental Support: calm environment promoted; caregiver consistency promoted  Sleep/Rest Enhancement: awakenings minimized; consistent schedule promoted; family presence promoted; noise level reduced; regular sleep/rest pattern promoted  Medication Review/Management: medications reviewed; dosing adjusted; infusion titrated     Problem: Nutrition Impairment (Mechanical Ventilation, Invasive)  Goal: Optimal Nutrition Delivery  Outcome: Ongoing - Unchanged     Problem: Skin and Tissue Injury (Mechanical Ventilation, Invasive)  Goal: Absence of Device-Related Skin and Tissue Injury  Outcome: Ongoing - Unchanged  Intervention: Maintain Skin and Tissue Health  Flowsheets (Taken 06/10/2018 0600 by Drinda Butts, RN)  Device Skin Pressure Protection: absorbent pad utilized/changed     Problem: Ventilator-Induced Lung Injury (Mechanical Ventilation, Invasive)  Goal: Absence of Ventilator-Induced Lung Injury  Outcome: Ongoing - Unchanged  Intervention: Facilitate Lung-Protection Measures  Flowsheets (Taken 06/11/2018 0244)  Lung Protection Measures: lung compliance monitored; optimal PEEP applied  Intervention: Prevent Ventilator-Associated Pneumonia  Flowsheets  Taken 06/11/2018 0244  Suction Type: Subglottic  VAP Prevention Bundle: HOB elevation maintained;oral care regularly provided;readiness to extubate assessed;sedation interruption performed;spontaneous breathing trial performed;stress ulcer prophylaxis provided;vent circuit breaks minimized;VTE prophylaxis provided  Oral Care: oral rinse provided;tongue brushed;teeth brushed;mouth swabbed  Taken 06/10/2018 2000  Head of Bed (HOB): HOB at 30 degrees     Problem: Self-Care Deficit  Goal: Improved Ability to Complete Activities of Daily Living  Outcome: Ongoing - Unchanged  Intervention: Promote Activity and Functional Independence  Flowsheets (Taken 06/11/2018 0100)  Activity Assistance Provided: assistance, 2 people (Pended)     Problem: Hypertension Comorbidity  Goal: Blood Pressure in Desired Range  Outcome: Ongoing - Unchanged  Intervention: Maintain Hypertension-Management Strategies  Flowsheets (Taken 06/11/2018 0244)  Medication Review/Management: medications reviewed; dosing adjusted; infusion titrated     Problem: Pain Chronic (Persistent) (Comorbidity Management)  Goal: Acceptable Pain Control and Functional Ability  Outcome: Ongoing - Unchanged  Intervention: Develop Pain Management Plan  Flowsheets (Taken 06/11/2018 0244)  Pain Management Interventions: care clustered; quiet environment facilitated; position adjusted  Intervention: Manage Persistent Pain  Flowsheets (Taken 06/11/2018 0244)  Sleep/Rest Enhancement: awakenings minimized; consistent schedule promoted; family presence promoted; noise level reduced; regular sleep/rest pattern promoted  Medication Review/Management: medications reviewed; dosing adjusted; infusion titrated     Problem: Fluid Imbalance (Pneumonia)  Goal: Fluid Balance  Outcome: Ongoing - Unchanged  Intervention: Monitor and Manage Fluid Balance  Flowsheets (Taken 06/11/2018 0244)  Fluid/Electrolyte Management: fluids adjusted     Problem: Infection (Pneumonia)  Goal: Resolution of Infection Signs/Symptoms  Outcome: Ongoing - Unchanged  Intervention: Prevent Infection Progression  Flowsheets (Taken 06/10/2018 2000)  Infection Management: aseptic technique maintained     Problem: Respiratory Compromise (Pneumonia)  Goal: Effective Oxygenation and Ventilation  Outcome: Ongoing - Unchanged  Intervention: Optimize Oxygenation and Ventilation  Flowsheets  Taken 06/10/2018 2000  Head of Bed [facility]): HOB at 30 degrees  Taken 06/11/2018 0100  Airway/Ventilation Management: pulmonary  hygiene promoted;humidification applied (Pended)

## 2018-06-11 NOTE — Unmapped (Signed)
Ms. Rohwer is a 62 year old female, s/p OLT in 71. Admitted with resp failure.    On cyclosporin 50 mg BID. Goal trough 50-100. Trough today is 61. No changes to cyclosporin. LFTs WNL.    Hepatology will continue to follow. Please page with questions.    Thank you for this consult.  Please page [phone] with questions.    Ace Gins, NP-C  Transplant, Hepatology

## 2018-06-11 NOTE — Unmapped (Signed)
Pt remains on the vent. ETT secure and patent. BSS coarse and decreased. Suctioned for small amounts of thick tan secretions. Tolerated well. No vent changes made this shift. Will continue to monitor.

## 2018-06-11 NOTE — Unmapped (Signed)
Problem: Adult Inpatient Plan of Care  Goal: Plan of Care Review  06/11/2018 1007 by Jan Fireman, RN  Outcome: Progressing  06/11/2018 0910 by Jan Fireman, RN  Outcome: Progressing  Goal: Patient-Specific Goal (Individualization)  06/11/2018 1007 by Jan Fireman, RN  Outcome: Progressing  06/11/2018 0910 by Jan Fireman, RN  Outcome: Progressing  Goal: Absence of Hospital-Acquired Illness or Injury  06/11/2018 1007 by Jan Fireman, RN  Outcome: Progressing  06/11/2018 0910 by Jan Fireman, RN  Outcome: Progressing  Goal: Optimal Comfort and Wellbeing  06/11/2018 1007 by Jan Fireman, RN  Outcome: Progressing  06/11/2018 0910 by Jan Fireman, RN  Outcome: Progressing  Goal: Readiness for Transition of Care  06/11/2018 1007 by Jan Fireman, RN  Outcome: Progressing  06/11/2018 0910 by Jan Fireman, RN  Outcome: Progressing  Goal: Rounds/Family Conference  06/11/2018 1007 by Jan Fireman, RN  Outcome: Progressing  06/11/2018 0910 by Jan Fireman, RN  Outcome: Progressing     Problem: Latex Allergy  Goal: Absence of Allergy Symptoms  06/11/2018 1007 by Jan Fireman, RN  Outcome: Progressing  06/11/2018 0910 by Jan Fireman, RN  Outcome: Progressing     Problem: Skin Injury Risk Increased  Goal: Skin Health and Integrity  06/11/2018 1007 by Jan Fireman, RN  Outcome: Progressing  06/11/2018 0910 by Jan Fireman, RN  Outcome: Progressing     Problem: Fall Injury Risk  Goal: Absence of Fall and Fall-Related Injury  06/11/2018 1007 by Jan Fireman, RN  Outcome: Progressing  06/11/2018 0910 by Jan Fireman, RN  Outcome: Progressing     Problem: Communication Impairment (Mechanical Ventilation, Invasive)  Goal: Effective Communication  06/11/2018 1007 by Jan Fireman, RN  Outcome: Progressing  06/11/2018 0910 by Jan Fireman, RN  Outcome: Progressing     Problem: Device-Related Complication Risk (Mechanical Ventilation, Invasive)  Goal: Optimal Device Function  06/11/2018 1007 by Jan Fireman, RN  Outcome: Progressing  06/11/2018 0910 by Jan Fireman, RN  Outcome: Progressing     Problem: Inability to Wean (Mechanical Ventilation, Invasive)  Goal: Mechanical Ventilation Liberation  06/11/2018 1007 by Jan Fireman, RN  Outcome: Progressing  06/11/2018 0910 by Jan Fireman, RN  Outcome: Progressing     Problem: Nutrition Impairment (Mechanical Ventilation, Invasive)  Goal: Optimal Nutrition Delivery  06/11/2018 1007 by Jan Fireman, RN  Outcome: Progressing  06/11/2018 0910 by Jan Fireman, RN  Outcome: Progressing     Problem: Skin and Tissue Injury (Mechanical Ventilation, Invasive)  Goal: Absence of Device-Related Skin and Tissue Injury  06/11/2018 1007 by Jan Fireman, RN  Outcome: Progressing  06/11/2018 0910 by Jan Fireman, RN  Outcome: Progressing     Problem: Ventilator-Induced Lung Injury (Mechanical Ventilation, Invasive)  Goal: Absence of Ventilator-Induced Lung Injury  06/11/2018 1007 by Jan Fireman, RN  Outcome: Progressing  06/11/2018 0910 by Jan Fireman, RN  Outcome: Progressing     Problem: Self-Care Deficit  Goal: Improved Ability to Complete Activities of Daily Living  06/11/2018 1007 by Jan Fireman, RN  Outcome: Progressing  06/11/2018 0910 by Jan Fireman, RN  Outcome: Progressing     Problem: Hypertension Comorbidity  Goal: Blood Pressure in Desired Range  06/11/2018 1007 by Jan Fireman, RN  Outcome: Progressing  06/11/2018 0910 by Jan Fireman, RN  Outcome: Progressing     Problem: Pain Chronic (Persistent) (Comorbidity  Management)  Goal: Acceptable Pain Control and Functional Ability  06/11/2018 1007 by Jan Fireman, RN  Outcome: Progressing  06/11/2018 0910 by Jan Fireman, RN  Outcome: Progressing   See earlier note for 06/11/18  Problem: Fluid Imbalance (Pneumonia)  Goal: Fluid Balance  06/11/2018 1007 by Jan Fireman, RN  Outcome: Progressing  06/11/2018 0910 by Jan Fireman, RN  Outcome: Progressing     Problem: Infection (Pneumonia)  Goal: Resolution of Infection Signs/Symptoms  06/11/2018 1007 by Jan Fireman, RN  Outcome: Progressing  06/11/2018 0910 by Jan Fireman, RN  Outcome: Progressing     Problem: Respiratory Compromise (Pneumonia)  Goal: Effective Oxygenation and Ventilation  06/11/2018 1007 by Jan Fireman, RN  Outcome: Progressing  06/11/2018 0910 by Jan Fireman, RN  Outcome: Progressing

## 2018-06-11 NOTE — Unmapped (Signed)
Patient is currently on the ventilator and settings have not changed.

## 2018-06-11 NOTE — Unmapped (Signed)
MICU Progress Note     Date of Service: 06/11/2018    Problem List:       Interval history: Katherine Norton is a 62 y.o. female with  PMHx of liver transplant (1999) on immunosuppresion, chronic back pain, anxiety, HTN, CKD,  that presents to [facility] as a transfer from [facility], with respiratory failure and possible contained duodenal perforation.      24hr events: no events, failed SBT    Dispo: ICU    Neurological   #acute on chronic pain, sedation  #acute encephalopathy:  - presented to OSH altered, but also hypoxic at that time.  - improved prior to extubation and immed following re-intubation (FSC nodding y/n)  - mentation limited by sedation for vent compliance and comfort  - analegsia prn  - cont low dose propofol gtt , RASS goal 0 to -1, dec to off as tol  - inc'd oxy to 10 mg scheduled every 6 hrs      Pulmonary   #acute hypoxic respiratory failure likely due to multifocal PNA, ARDS.  Intubated on 8/1 at OSH CT chest with Scattered airspace opacities in the left apex, left upper lobe and superior segment of left lower lobe; concerning for pneumonia.  Extubated 8/12 and reint 8/13 for progressive hypoxia and inc WOB   - tolerating PRVC well  - cont wean vent as tol  - continues to fail SBT d/t inc RR and low Vt (100s)    - Optimal PEEP 14 per esophogeal balloon eval (8/14)   - sedation for vent compliance   - goal sats >90%, pao2 >80, pco2 30-40  - pulm toilet as tol, HOB>30deg  - peridex for vap ppx       Cardiovascular   #acute HFrEF  #h/o hypertension   - hold home metoprolol and amlodipine as recently off pressor  - prn hydralazine  - Echo 7/29: EF 65-70%, mod pulm HTN and borderline dilated RV   - repeat echo 8/16 EF >55%, severe RV fx and dilated RA, severe phtn, mod TR    #Hypotension:  - developed pressor requirement 8/15  - Continue norepi gtt for MAP>65    Renal   #Acute on chronic renal failure  - Cr baseline appears to be ~1.7--> 3-->2.87  - Was on diuresis regimen at OSH, but was net pos due to enteral feeds  - Trend and may require CRRT or diuresis regimen  - Trial of 40mg  lasix 8/13 w/ good output but decent bump in Cr. Will hold further diuresis   - Cr continues to rise  - nephrology consulted 8/16, appreciate recs >> holding on CRRT for now    Infectious Disease/Autoimmune   #septic shock due to ? PNA.  Initially seen at OSH and had c/f perforated duodenal ulcer, but CT scan here non concerning.  CT chest with possible multifocal PNA.    - appreciate ID's assistance. ID signed off 8/17  - S/p bronch 8/13 w/ no growth  - f/u pan cx sent 8/10, 8/11 >> ngtd  - f/u BAL 8/13, blood/urine 8/15  - WOCN consulted for intertrigo and sacral wound (see recs 8/19)  - consider cdiff if spikes temp or new leukocytosis    Abx:   Vanco (8/11-->8/12)  Mica (8/11-->8/12)  Flagyl (8/10-->8/14)  Cefepime  (8/10-->8/16)  Voriconazole (8/12-->8/15)  Linezolid (8/15-8/16)    Abx: OSH  ceftaroline and flagyl  levaquin  meropenem     Cx OSH:  - 7/29 Strep pneumo Ag  neg, RVP neg, urine Legionella Ag neg  - 7/29 bd cx NGTD  OSH: Fungal, pneumocystis/histoplasma, AFB, Gram on BAL all were negative.  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  Per surgery, NO perf.      #abd pain:   - pt endorsed abd pain, prior to reintubation >> cont to have tenderness on palpation  - lipase elevated on 8/11, f/u in am       Malnutrition Assessment: Not done yet.   - Tol Tf well at goal  - FMS for liquid stools, output unchanged   - consider cdiff if spikes temp or new leukocytosis    Heme/Coag   Acute blood loss- no s/s of bleeding  - Hgb drop to 5.8 at OSH with transfusion  - cbc stable.    - DVT ppx held due to drop in h/h.  Restarted.      Endocrine   nai Prophylaxis/LDA/Restraints/Consults   Can CVC be removed? No: need for medications requiring central access (e.g. pressors)   Can A-line be removed? No: frequent ABGs  Can Foley be removed? No: Need continuous I/O  Mobility plan: Step 1 - Range of motion    Feeding: Tube feeds at goal  Analgesia: Pain adequately controlled  Sedation SAT/SBT: Yes  Thrombembolic ppx: SQ heparin  Head of bed: >30 deg  Ulcer ppx: Yes, mechanical ventilation > 3 days, not on tube feeds  Glucose within target range: Yes, in range    RASS at goal? Yes  [place] Agitation Assessment Scale (RASS) : -1 (06/11/2018 12:00 PM)     Can antipsychotics be stopped? N/A, not on antipsychotics  CAM-ICU Result: Positive (06/11/2018  7:26 AM)      Patient Lines/Drains/Airways Status    Active Active Lines, Drains, & Airways     Name:   Placement date:   Placement time:   Site:   Days:    ETT  7.5   06/05/18    0037     6    CVC Triple Lumen 06/03/18 Non-tunneled Right Internal jugular   06/03/18    1503    Internal jugular   7    NG/OG Tube Right nostril   06/02/18    2121    Right nostril   8    Urethral Catheter   06/02/18    2118    ???   8    Arterial Line 06/03/18 Right Radial   06/03/18    0007    Radial   8              Patient Lines/Drains/Airways Status    Active Wounds     Name:   Placement date:   Placement time:   Site:   Days:    Wound 06/08/18 Pressure Injury Buttocks Posterior pink Stage 1   06/08/18    0001    Buttocks   3    Wound 06/09/18 Rash/Dermititis Perineum   06/09/18    2154    Perineum   1                Goals of Care     Code Status: Full Code    Designated  Healthcare Decision Maker:  [name] currently lacks decisional capacity for healthcare decision-making and is unable to designate a surrogate healthcare decision maker. [name] designated healthcare decision maker(s) is/are alorah mcree and [name] (the patient's adult child) as denoted by hospital policy for patients without a known preference.      Subjective Katherine Norton is a 62 y.o. female with PMHx of liver transplant (1999) on immunosuppresion, chronic back pain, anxiety, HTN, CKD,  that presents to [facility] as a transfer from [facility]. Patient initially presented to the OSH on 05/20/18 due to acute worsening shortness of breath and altered mental status. Patient was sating 68% on RA, but 90% on CPAP. Patient had severe acidemia on ABG as well as acute on chronic renal failure with Cr of 3.89. CXR at that time showed consolidation with concern for pneumonia and was febrile. Patient's initial lactate was 2.70. Patient was started on IV ceftaroline and flagyl initially.      Objective     Vitals - past 24 hours  Heart Rate:  [97-108] 106  SpO2 Pulse:  [98-106] 106  Resp:  [21-36] 24  FiO2 (%):  [30 %] 30 %  SpO2:  [94 %-97 %] 97 % Intake/Output  I/O last 3 completed shifts:  In: 2543 [I.V.:451; NG/GT:2092]  Out: 751 [Urine:251; Stool:500]     Physical Exam:    General: obese female, awakens to stimuli, nods appropriately  HEENT: PERRLA  CV: RRR, no mrg  Pulm: coarse bs bilaterally  GI: soft, distended, non-tender  MSK: no edema  Skin: intact  Neuro: awake, FC, mae      Continuous Infusions:   ??? norepinephrine bitartrate-NS 2 mcg/min (06/11/18 1204)   ??? propofol 5 mcg/kg/min (06/11/18 1204)       Scheduled Medications:   ??? calcitonin (salmon)  1 spray Alternating Nares Daily (RT)   ??? chlorhexidine  5 mL Mouth BID   ??? cycloSPORINE  50 mg NG tube Daily    And   ??? cycloSPORINE  25 mg NG tube Nightly (2000)   ??? esomeprazole  40 mg NG tube daily   ??? heparin (porcine) for subcutaneous use  5,000 Units Subcutaneous [facility]   ??? oxyCODONE  10 mg NG tube Q6H   ??? sodium bicarbonate  650 mg NG tube 4x Daily       PRN medications:      Data/Imaging Review: Reviewed in Epic and personally interpreted on 06/11/2018. See EMR for detailed results.      Critical Care Attestation     This patient is critically ill or injured with the impairment of vital organ systems such that there is a high probability of imminent or life threatening deterioration in the patient's condition. This patient must remain in the ICU for ongoing evaluation of the comprehensive management plan outlined in this note. I directly provided critical care services as documented in this note and the critical care time spent (35 min) is exclusive of separately billable procedures.    Vonna Brabson Fonnie Mu, ACNP

## 2018-06-11 NOTE — Unmapped (Signed)
Opens to voice slight mov't of hands and feet to stimulation follows some commands I.e. Open moth MAP > 65 on norepi remains on propofol to keep RASS 0 to -2.  Turned side to side q2hrs HOB at 30 degrees to TF at goal stool per FMS scant UO via foley will update family when in or call.

## 2018-06-12 LAB — COMPREHENSIVE METABOLIC PANEL
ALBUMIN: 2.6 g/dL — ABNORMAL LOW (ref 3.5–5.0)
ALKALINE PHOSPHATASE: 96 U/L (ref 38–126)
ALT (SGPT): 10 U/L — ABNORMAL LOW (ref 15–48)
ANION GAP: 18 mmol/L — ABNORMAL HIGH (ref 9–15)
AST (SGOT): 23 U/L (ref 14–38)
BILIRUBIN TOTAL: 0.8 mg/dL (ref 0.0–1.2)
BUN / CREAT RATIO: 20
CALCIUM: 8.8 mg/dL (ref 8.5–10.2)
CHLORIDE: 105 mmol/L (ref 98–107)
CO2: 17 mmol/L — ABNORMAL LOW (ref 22.0–30.0)
CREATININE: 5.94 mg/dL — ABNORMAL HIGH (ref 0.60–1.00)
EGFR CKD-EPI AA FEMALE: 8 mL/min/{1.73_m2} — ABNORMAL LOW (ref >=60)
EGFR CKD-EPI NON-AA FEMALE: 7 mL/min/{1.73_m2} — ABNORMAL LOW (ref >=60)
GLUCOSE RANDOM: 145 mg/dL (ref 65–179)
POTASSIUM: 3.6 mmol/L (ref 3.5–5.0)
PROTEIN TOTAL: 6.3 g/dL — ABNORMAL LOW (ref 6.5–8.3)
SODIUM: 140 mmol/L (ref 135–145)

## 2018-06-12 LAB — POTASSIUM WHOLE BLOOD: Potassium:SCnc:Pt:Bld:Qn:: 3.4

## 2018-06-12 LAB — CO2: Carbon dioxide:SCnc:Pt:Ser/Plas:Qn:: 17 — ABNORMAL LOW

## 2018-06-12 LAB — CBC
HEMATOCRIT: 21.5 % — ABNORMAL LOW (ref 36.0–46.0)
HEMOGLOBIN: 6.7 g/dL — ABNORMAL LOW (ref 12.0–16.0)
MEAN CORPUSCULAR VOLUME: 100 fL (ref 80.0–100.0)
MEAN PLATELET VOLUME: 12 fL — ABNORMAL HIGH (ref 7.0–10.0)
PLATELET COUNT: 109 10*9/L — ABNORMAL LOW (ref 150–440)
RED BLOOD CELL COUNT: 2.15 10*12/L — ABNORMAL LOW (ref 4.00–5.20)
RED CELL DISTRIBUTION WIDTH: 17.9 % — ABNORMAL HIGH (ref 12.0–15.0)
WBC ADJUSTED: 6.9 10*9/L (ref 4.5–11.0)

## 2018-06-12 LAB — MAGNESIUM: Magnesium:MCnc:Pt:Ser/Plas:Qn:: 2.1

## 2018-06-12 LAB — RED BLOOD CELL COUNT: Lab: 2.15 — ABNORMAL LOW

## 2018-06-12 LAB — BLOOD GAS CRITICAL CARE PANEL, ARTERIAL
CALCIUM IONIZED ARTERIAL (MG/DL): 4.74 mg/dL (ref 4.40–5.40)
FIO2 ARTERIAL: 30
GLUCOSE WHOLE BLOOD: 152 mg/dL
HCO3 ARTERIAL: 18 mmol/L — ABNORMAL LOW (ref 22–27)
LACTATE BLOOD ARTERIAL: 1.3 mmol/L — ABNORMAL HIGH (ref <=1.2)
O2 SATURATION ARTERIAL: 95.8 % (ref 94.0–100.0)
PCO2 ARTERIAL: 37 mmHg (ref 35.0–45.0)
PH ARTERIAL: 7.31 — ABNORMAL LOW (ref 7.35–7.45)
PO2 ARTERIAL: 89.5 mmHg (ref 80.0–110.0)
POTASSIUM WHOLE BLOOD: 3.4 mmol/L (ref 3.4–4.6)

## 2018-06-12 LAB — TRIGLYCERIDES: Triglyceride:MCnc:Pt:Ser/Plas:Qn:: 344 — ABNORMAL HIGH

## 2018-06-12 LAB — PHOSPHORUS: Phosphate:MCnc:Pt:Ser/Plas:Qn:: 8.3 — ABNORMAL HIGH

## 2018-06-12 LAB — LIPASE: Triacylglycerol lipase:CCnc:Pt:Ser/Plas:Qn:: 239 — ABNORMAL HIGH

## 2018-06-12 NOTE — Unmapped (Addendum)
Patient remains somnolent, opens eyes to voice for brief period of time. Unable to follow commands, unable to assess CAM-ICU status at this time. Pupils equal and reactive. Received x1 PRN morphine for pain with adequate response. Low ST this shift. Maintaining BP with MAP >65. Norepi gtt infusing at 5 mcg/min. Received 1 unit of PRBC's for Hgb of 6.7 and tolerated well. No rxn noted. Remains on APV vent settings. Nepro tube feeds infusing at 45 mL/hr. Family called and goals of care continue to be discussed. Patient code status changed to code with limitations. Will continue to monitor.     Problem: Adult Inpatient Plan of Care  Goal: Plan of Care Review  06/12/2018 1401 by Herminio Commons, RN  Outcome: Ongoing - Unchanged  Flowsheets  Taken 06/12/2018 1401 by Herminio Commons, RN  Progress: no change  Taken 06/12/2018 1401 by Herminio Commons, RN  Plan of Care Reviewed With: family  Goal: Patient-Specific Goal (Individualization)  Outcome: Ongoing - Unchanged  Goal: Absence of Hospital-Acquired Illness or Injury  Outcome: Ongoing - Unchanged  Goal: Optimal Comfort and Wellbeing  Outcome: Ongoing - Unchanged  Goal: Readiness for Transition of Care  Outcome: Ongoing - Unchanged  Goal: Rounds/Family Conference  Outcome: Ongoing - Unchanged  Flowsheets (Taken 06/12/2018 1359)  Participants: advanced practice nurse; family; nursing     Problem: Latex Allergy  Goal: Absence of Allergy Symptoms  Outcome: Ongoing - Unchanged     Problem: Skin Injury Risk Increased  Goal: Skin Health and Integrity  Outcome: Ongoing - Unchanged  Intervention: Optimize Skin Protection  Flowsheets  Taken 06/12/2018 1200  Pressure Reduction Techniques: weight shift assistance provided  Pressure Reduction Devices: positioning supports utilized;pressure-redistributing mattress utilized;heel offloading device utilized  Taken 06/12/2018 0800  Head of Bed (HOB): HOB at 30 degrees  Skin Protection: adhesive use limited;cleansing with dimethicone incontinence wipes;incontinence pads utilized     Problem: Fall Injury Risk  Goal: Absence of Fall and Fall-Related Injury  Outcome: Ongoing - Unchanged     Problem: Communication Impairment (Mechanical Ventilation, Invasive)  Goal: Effective Communication  Outcome: Ongoing - Unchanged     Problem: Device-Related Complication Risk (Mechanical Ventilation, Invasive)  Goal: Optimal Device Function  Outcome: Ongoing - Unchanged     Problem: Inability to Wean (Mechanical Ventilation, Invasive)  Goal: Mechanical Ventilation Liberation  Outcome: Ongoing - Unchanged     Problem: Nutrition Impairment (Mechanical Ventilation, Invasive)  Goal: Optimal Nutrition Delivery  Outcome: Ongoing - Unchanged     Problem: Skin and Tissue Injury (Mechanical Ventilation, Invasive)  Goal: Absence of Device-Related Skin and Tissue Injury  Outcome: Ongoing - Unchanged     Problem: Ventilator-Induced Lung Injury (Mechanical Ventilation, Invasive)  Goal: Absence of Ventilator-Induced Lung Injury  Outcome: Ongoing - Unchanged     Problem: Self-Care Deficit  Goal: Improved Ability to Complete Activities of Daily Living  Outcome: Ongoing - Unchanged     Problem: Pain Chronic (Persistent) (Comorbidity Management)  Goal: Acceptable Pain Control and Functional Ability  Outcome: Ongoing - Unchanged     Problem: Fluid Imbalance (Pneumonia)  Goal: Fluid Balance  Outcome: Ongoing - Unchanged     Problem: Infection (Pneumonia)  Goal: Resolution of Infection Signs/Symptoms  Outcome: Ongoing - Unchanged     Problem: Respiratory Compromise (Pneumonia)  Goal: Effective Oxygenation and Ventilation  Outcome: Ongoing - Unchanged

## 2018-06-12 NOTE — Unmapped (Signed)
I spoke w/ brother of pt, Katherine Norton, and called the daughter, [name]. Gina plans to fly here Friday to see her mother and to talk in person. She is open to the idea that trach and dialysis may not be what her mother wanted. 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 I explained that we would not escalate care if pt decompensated prior to her arrival, and that I would make her a DNR. She was in agreement about both of these decisions. We will not escalate beyond Norepi. If she becomes acutely worse, we will transition to comfort care after talking with [name] and Katherine Norton. Katherine Norton is also in agreement with this plan.

## 2018-06-12 NOTE — Unmapped (Signed)
MICU Progress Note     Date of Service: 06/12/2018    Problem List:       Interval history: Katherine Norton is a 62 y.o. female with  PMHx of liver transplant (1999) on immunosuppresion, chronic back pain, anxiety, HTN, CKD,  that presents to [facility] as a transfer from [facility], with respiratory failure and possible contained duodenal perforation.      24hr events: no events, failed SBT     Dispo: ICU    Neurological   #acute on chronic pain, sedation  #acute encephalopathy:  - presented to OSH altered, but also hypoxic at that time.  - improved prior to extubation and immed following re-intubation (FSC nodding y/n)  - mentation limited by sedation for vent compliance and comfort  - analegsia prn  - propofol dc'd 8/20  - inc'd oxy to 10 mg scheduled every 6 hrs 8/19      Pulmonary   #acute hypoxic respiratory failure likely due to multifocal PNA, ARDS.  Intubated on 8/1 at OSH CT chest with Scattered airspace opacities in the left apex, left upper lobe and superior segment of left lower lobe; concerning for pneumonia.  Extubated 8/12 and reint 8/13 for progressive hypoxia and inc WOB   - tolerating PRVC well  - cont wean vent as tol  - continues to fail SBT d/t inc RR and low Vt (100s)    - Optimal PEEP 14 per esophogeal balloon eval (8/14)   - sedation for vent compliance   - goal sats >90%, pao2 >80, pco2 30-40  - pulm toilet as tol, HOB>30deg  - peridex for vap ppx       Cardiovascular   #acute HFrEF  #h/o hypertension   - hold home metoprolol and amlodipine as recently off pressor  - prn hydralazine  - Echo 7/29: EF 65-70%, mod pulm HTN and borderline dilated RV   - repeat echo 8/16 EF >55%, severe RV fx and dilated RA, severe phtn, mod TR    #Hypotension:  - developed pressor requirement 8/15  - Continue norepi gtt for MAP>65    Renal   #Acute on chronic renal failure  - Cr baseline appears to be ~1.7--> 3-->2.87  - Was on diuresis regimen at OSH, but was net pos due to enteral feeds  - Trend and may require CRRT or diuresis regimen  - Trial of 40mg  lasix 8/13 w/ good output but decent bump in Cr. Will hold further diuresis   - Cr continues to rise  - nephrology consulted 8/16, appreciate recs >> holding on CRRT for now for family discussions    Infectious Disease/Autoimmune   #septic shock due to ? PNA.  Initially seen at OSH and had c/f perforated duodenal ulcer, but CT scan here non concerning.  CT chest with possible multifocal PNA.    - appreciate ID's assistance. ID signed off 8/17  - S/p bronch 8/13 w/ no growth  - f/u pan cx sent 8/10, 8/11 >> ngtd  - f/u BAL 8/13, blood/urine 8/15  - WOCN consulted for intertrigo and sacral wound (see recs 8/19)  - consider cdiff if spikes temp or new leukocytosis    Abx:   Vanco (8/11-->8/12)  Mica (8/11-->8/12)  Flagyl (8/10-->8/14)  Cefepime  (8/10-->8/16)  Voriconazole (8/12-->8/15)  Linezolid (8/15-8/16)    Abx: OSH  ceftaroline and flagyl  levaquin  meropenem     Cx OSH:  - 7/29 Strep pneumo Ag neg, RVP neg, urine Legionella Ag neg  -  7/29 bd cx NGTD  OSH: Fungal, pneumocystis/histoplasma, AFB, Gram on BAL all were negative.  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  Per surgery, NO perf.      #abd pain:   - pt endorsed abd pain, prior to reintubation >> cont to have tenderness on palpation  - lipase elevated on 8/11, f/u in am       Malnutrition Assessment: Not done yet.   - Tol Tf well at goal  - FMS for liquid stools, output unchanged   - consider cdiff if spikes temp or new leukocytosis    Heme/Coag   Acute blood loss- no s/s of bleeding  - Hgb drop to 5.8 at OSH with transfusion  - cbc stable.    - DVT ppx held due to drop in h/h.  Restarted.      Endocrine   nai Prophylaxis/LDA/Restraints/Consults   Can CVC be removed? No: need for medications requiring central access (e.g. pressors)   Can A-line be removed? No: frequent ABGs  Can Foley be removed? No: Need continuous I/O  Mobility plan: Step 1 - Range of motion    Feeding: Tube feeds at goal  Analgesia: Pain adequately controlled  Sedation SAT/SBT: Yes  Thrombembolic ppx: SQ heparin  Head of bed: >30 deg  Ulcer ppx: Yes, mechanical ventilation > 3 days, not on tube feeds  Glucose within target range: Yes, in range    RASS at goal? Yes  [place] Agitation Assessment Scale (RASS) : -1 (06/12/2018  7:00 AM)     Can antipsychotics be stopped? N/A, not on antipsychotics  CAM-ICU Result: Positive (06/11/2018  7:26 AM)      Patient Lines/Drains/Airways Status    Active Active Lines, Drains, & Airways     Name:   Placement date:   Placement time:   Site:   Days:    ETT  7.5   06/05/18    0037     7    CVC Triple Lumen 06/03/18 Non-tunneled Right Internal jugular   06/03/18    1503    Internal jugular   8    NG/OG Tube Right nostril   06/02/18    2121    Right nostril   9    Urethral Catheter   06/02/18    2118    ???   9    Arterial Line 06/03/18 Right Radial   06/03/18    0007    Radial   9              Patient Lines/Drains/Airways Status    Active Wounds     Name:   Placement date:   Placement time:   Site:   Days:    Wound 06/08/18 Pressure Injury Buttocks Posterior pink Stage 2   06/08/18    0001    Buttocks   4    Wound 06/09/18 Rash/Dermititis Perineum   06/09/18    2154    Perineum   2                Goals of Care     Code Status: Full Code    Designated Healthcare Decision Maker:  [name] currently lacks  decisional capacity for healthcare decision-making and is unable to designate a surrogate healthcare decision maker. [name] designated healthcare decision maker(s) is/are Katherine Norton and Katherine Norton (the patient's adult child) as denoted by hospital policy for patients without a known preference.      Subjective Katherine Norton is a 62 y.o. female with PMHx of liver transplant (1999) on immunosuppresion, chronic back pain, anxiety, HTN, CKD,  that presents to [facility] as a transfer from [facility]. Patient initially presented to the OSH on 05/20/18 due to acute worsening shortness of breath and altered mental status. Patient was sating 68% on RA, but 90% on CPAP. Patient had severe acidemia on ABG as well as acute on chronic renal failure with Cr of 3.89. CXR at that time showed consolidation with concern for pneumonia and was febrile. Patient's initial lactate was 2.70. Patient was started on IV ceftaroline and flagyl initially.      Objective     Vitals - past 24 hours  Heart Rate:  [100-117] 107  SpO2 Pulse:  [100-107] 107  Resp:  [23-46] 25  FiO2 (%):  [30 %] 30 %  SpO2:  [93 %-98 %] 96 % Intake/Output  I/O last 3 completed shifts:  In: 2371.5 [I.V.:239.5; NG/GT:2132]  Out: 1047 [Urine:197; Stool:850]     Physical Exam:    General: obese female, awakens to stimuli, nods appropriately  HEENT: PERRLA  CV: RRR, no mrg  Pulm: coarse bs bilaterally  GI: soft, distended, non-tender  MSK: no edema  Skin: intact  Neuro: awake, FC, mae      Continuous Infusions:   ??? norepinephrine bitartrate-NS 4 mcg/min (06/12/18 0804)       Scheduled Medications:   ??? calcitonin (salmon)  1 spray Alternating Nares Daily (RT)   ??? chlorhexidine  5 mL Mouth BID   ??? cycloSPORINE  50 mg NG tube Daily    And   ??? cycloSPORINE  25 mg NG tube Nightly (2000)   ??? esomeprazole  40 mg NG tube daily   ??? heparin (porcine) for subcutaneous use  5,000 Units Subcutaneous [facility]   ??? oxyCODONE  10 mg NG tube Q6H   ??? sodium bicarbonate  650 mg NG tube 4x Daily       PRN medications:      Data/Imaging Review: Reviewed in Epic and personally interpreted on 06/12/2018. See EMR for detailed results.      Critical Care Attestation     This patient is critically ill or injured with the impairment of vital organ systems such that there is a high probability of imminent or life threatening deterioration in the patient's condition. This patient must remain in the ICU for ongoing evaluation of the comprehensive management plan outlined in this note. I directly provided critical care services as documented in this note and the critical care time spent (35 min) is exclusive of separately billable procedures.    Daryel November, ACNP

## 2018-06-12 NOTE — Unmapped (Signed)
PT remains on same settings, SBT trials this A.M. PT failed due to increased RR>40, decreased VT, RSBI >200. PT currently NAD noted at this time.

## 2018-06-12 NOTE — Unmapped (Signed)
Pt intermittently following commands. NorEpi titrated for MAP>65. Tolerated no pressors for about 3 hours. UOP remains diminished. Remains unsedated and tolerating well. Failed SBT. Will continue to monitor.    Problem: Adult Inpatient Plan of Care  Goal: Plan of Care Review  Outcome: Ongoing - Unchanged  Flowsheets (Taken 06/11/2018 0244)  Progress: no change  Plan of Care Reviewed With: family  Goal: Patient-Specific Goal (Individualization)  Outcome: Ongoing - Unchanged  Flowsheets (Taken 06/11/2018 0244)  Patient-Specific Goals (Include Timeframe): UTA - pt intubated and sedated  Individualized Care Needs: Pt repositioned for comfort when agitated  Anxieties, Fears or Concerns: UTA - pt intubated and sedated  Goal: Absence of Hospital-Acquired Illness or Injury  Outcome: Ongoing - Unchanged  Intervention: Identify and Manage Fall Risk  Flowsheets (Taken 06/11/2018 2000)  Safety Interventions: aspiration precautions;bleeding precautions;enteral feeding safety;environmental modification;fall reduction program maintained;lighting adjusted for tasks/safety;low bed  Intervention: Prevent Skin Injury  Flowsheets (Taken 06/11/2018 2000)  Pressure Reduction Techniques: weight shift assistance provided;heels elevated off bed  Intervention: Prevent VTE (venous thromboembolism)  Flowsheets (Taken 06/11/2018 2000 by Julieta Bellini, RN)  VTE Prevention/Management: anticoagulation therapy  Intervention: Prevent Infection  Flowsheets (Taken 06/11/2018 2000)  Infection Prevention: cohorting utilized;equipment surfaces disinfected;environmental surveillance performed;handwashing promoted;personal protective equipment utilized;rest/sleep promoted;single patient room provided  Goal: Optimal Comfort and Wellbeing  Outcome: Ongoing - Unchanged  Intervention: Monitor Pain and Promote Comfort  Flowsheets (Taken 06/11/2018 0244)  Pain Management Interventions: care clustered;quiet environment facilitated;position adjusted  Intervention: Provide Person-Centered Care  Flowsheets (Taken 06/11/2018 0244)  Trust Relationship/Rapport: reassurance provided  Goal: Readiness for Transition of Care  Outcome: Ongoing - Unchanged  Intervention: Mutually Develop Transition Plan  Flowsheets (Taken 06/04/2018 1341 by Loistine Simas, RN)  Discharge Facility/Level of Care Needs: other (see comments) (back transfer to [facility])  Equipment Currently Used at Home: none  Anticipated Changes Related to Illness: inability to care for self  Concerns to be Addressed: adjustment to diagnosis/illness  Goal: Rounds/Family Conference  Outcome: Ongoing - Unchanged     Problem: Latex Allergy  Goal: Absence of Allergy Symptoms  Outcome: Ongoing - Unchanged  Intervention: Maintain Latex-Aware Environment  Flowsheets (Taken 06/11/2018 0244)  Latex Precautions: latex precautions maintained     Problem: Skin Injury Risk Increased  Goal: Skin Health and Integrity  Outcome: Ongoing - Unchanged  Intervention: Optimize Skin Protection  Flowsheets  Taken 06/11/2018 2000 by Neva Seat, RN  Pressure Reduction Techniques: weight shift assistance provided;heels elevated off bed  Pressure Reduction Devices: pressure-redistributing mattress utilized;positioning supports utilized  Skin Protection: adhesive use limited;cleansing with dimethicone incontinence wipes;incontinence pads utilized;moisture wicking fabric applied to skin folds;sacral silicone foam dressing in place;pulse oximeter probe site changed;tubing/devices free from skin contact;zinc oxide barrier cream  Taken 06/12/2018 0200 by Maureen Ralphs, RRT  Head of Bed [facility]): HOB at 30-45 degrees  Intervention: Promote and Optimize Oral Intake  Flowsheets (Taken 06/11/2018 0244)  Oral Nutrition Promotion: calorie dense liquids provided     Problem: Fall Injury Risk  Goal: Absence of Fall and Fall-Related Injury  Outcome: Ongoing - Unchanged  Intervention: Identify and Manage Contributors to Fall Injury Risk  Flowsheets (Taken 06/11/2018 0244)  Medication Review/Management: medications reviewed;dosing adjusted;infusion titrated  Intervention: Promote Injury-Free Environment  Flowsheets  Taken 06/11/2018 2000  Safety Interventions: aspiration precautions;bleeding precautions;enteral feeding safety;environmental modification;fall reduction program maintained;lighting adjusted for tasks/safety;low bed  Taken 06/11/2018 0244  Environmental Safety Modification: clutter free environment maintained;lighting adjusted     Problem: Communication Impairment (Mechanical Ventilation, Invasive)  Goal: Effective Communication  Outcome: Ongoing - Unchanged     Problem: Device-Related Complication Risk (Mechanical Ventilation, Invasive)  Goal: Optimal Device Function  Outcome: Ongoing - Unchanged  Intervention: Optimize Device Care and Function  Flowsheets  Taken 06/11/2018 2000  Aspiration Precautions: upright posture maintained;tube feeding placement verified;respiratory status monitored  Taken 06/11/2018 0100  Airway Safety Measures: suction at bedside;manual resuscitator at bedside     Problem: Inability to Wean (Mechanical Ventilation, Invasive)  Goal: Mechanical Ventilation Liberation  Outcome: Ongoing - Unchanged  Intervention: Promote Extubation and Mechanical Ventilation Liberation  Flowsheets (Taken 06/11/2018 0244)  Environmental Support: calm environment promoted;caregiver consistency promoted  Sleep/Rest Enhancement: awakenings minimized;consistent schedule promoted;family presence promoted;noise level reduced;regular sleep/rest pattern promoted  Medication Review/Management: medications reviewed;dosing adjusted;infusion titrated     Problem: Nutrition Impairment (Mechanical Ventilation, Invasive)  Goal: Optimal Nutrition Delivery  Outcome: Ongoing - Unchanged     Problem: Skin and Tissue Injury (Mechanical Ventilation, Invasive)  Goal: Absence of Device-Related Skin and Tissue Injury  Outcome: Ongoing - Unchanged  Intervention: Maintain Skin and Tissue Health  Flowsheets (Taken 06/10/2018 0600 by Drinda Butts, RN)  Device Skin Pressure Protection: absorbent pad utilized/changed     Problem: Ventilator-Induced Lung Injury (Mechanical Ventilation, Invasive)  Goal: Absence of Ventilator-Induced Lung Injury  Outcome: Ongoing - Unchanged  Intervention: Facilitate Lung-Protection Measures  Flowsheets (Taken 06/11/2018 0244)  Lung Protection Measures: lung compliance monitored;optimal PEEP applied  Intervention: Prevent Ventilator-Associated Pneumonia  Flowsheets  Taken 06/11/2018 2000 by Julieta Bellini, RN  Suction Type: Oral  Taken 06/12/2018 0200 by Maureen Ralphs, RRT  Head of Bed [facility]): HOB at 30-45 degrees  Taken 06/11/2018 0430 by Rudy Jew [place], RRT  VAP Prevention Bundle: HOB elevation maintained  Taken 06/11/2018 2100 by Maureen Ralphs, RRT  Oral Care:  (RN completed oral care)     Problem: Self-Care Deficit  Goal: Improved Ability to Complete Activities of Daily Living  Outcome: Ongoing - Unchanged  Intervention: Promote Activity and Functional Independence  Flowsheets (Taken 06/11/2018 0100)  Activity Assistance Provided: assistance, 2 people (Pended)     Problem: Hypertension Comorbidity  Goal: Blood Pressure in Desired Range  Outcome: Ongoing - Unchanged  Intervention: Maintain Hypertension-Management Strategies  Flowsheets (Taken 06/11/2018 0244)  Medication Review/Management: medications reviewed;dosing adjusted;infusion titrated     Problem: Pain Chronic (Persistent) (Comorbidity Management)  Goal: Acceptable Pain Control and Functional Ability  Outcome: Ongoing - Unchanged  Intervention: Develop Pain Management Plan  Flowsheets (Taken 06/11/2018 0244)  Pain Management Interventions: care clustered;quiet environment facilitated;position adjusted  Intervention: Manage Persistent Pain  Flowsheets (Taken 06/11/2018 0244)  Sleep/Rest Enhancement: awakenings minimized;consistent schedule promoted;family presence promoted;noise level reduced;regular sleep/rest pattern promoted  Medication Review/Management: medications reviewed;dosing adjusted;infusion titrated     Problem: Fluid Imbalance (Pneumonia)  Goal: Fluid Balance  Outcome: Ongoing - Unchanged  Intervention: Monitor and Manage Fluid Balance  Flowsheets (Taken 06/11/2018 0244)  Fluid/Electrolyte Management: fluids adjusted     Problem: Infection (Pneumonia)  Goal: Resolution of Infection Signs/Symptoms  Outcome: Ongoing - Unchanged  Intervention: Prevent Infection Progression  Flowsheets (Taken 06/11/2018 2000)  Infection Management: aseptic technique maintained     Problem: Respiratory Compromise (Pneumonia)  Goal: Effective Oxygenation and Ventilation  Outcome: Ongoing - Unchanged  Intervention: Optimize Oxygenation and Ventilation  Flowsheets (Taken 06/12/2018 0200 by Maureen Ralphs, RRT)  Head of Bed [facility]): HOB at 30-45 degrees  Airway/Ventilation Management: humidification applied

## 2018-06-13 DIAGNOSIS — J9601 Acute respiratory failure with hypoxia: Principal | ICD-10-CM

## 2018-06-13 LAB — CBC
HEMATOCRIT: 22.3 % — ABNORMAL LOW (ref 36.0–46.0)
HEMOGLOBIN: 6.9 g/dL — ABNORMAL LOW (ref 12.0–16.0)
MEAN CORPUSCULAR HEMOGLOBIN CONC: 31 g/dL (ref 31.0–37.0)
MEAN CORPUSCULAR HEMOGLOBIN: 30.3 pg (ref 26.0–34.0)
MEAN CORPUSCULAR VOLUME: 97.8 fL (ref 80.0–100.0)
PLATELET COUNT: 95 10*9/L — ABNORMAL LOW (ref 150–440)
RED BLOOD CELL COUNT: 2.28 10*12/L — ABNORMAL LOW (ref 4.00–5.20)
RED CELL DISTRIBUTION WIDTH: 18 % — ABNORMAL HIGH (ref 12.0–15.0)
WBC ADJUSTED: 6.2 10*9/L (ref 4.5–11.0)

## 2018-06-13 LAB — COMPREHENSIVE METABOLIC PANEL
ALBUMIN: 2.4 g/dL — ABNORMAL LOW (ref 3.5–5.0)
ALKALINE PHOSPHATASE: 81 U/L (ref 38–126)
ALT (SGPT): 12 U/L — ABNORMAL LOW (ref 15–48)
ANION GAP: 18 mmol/L — ABNORMAL HIGH (ref 9–15)
AST (SGOT): 20 U/L (ref 14–38)
BILIRUBIN TOTAL: 0.7 mg/dL (ref 0.0–1.2)
BLOOD UREA NITROGEN: 127 mg/dL — ABNORMAL HIGH (ref 7–21)
BUN / CREAT RATIO: 20
CALCIUM: 8.8 mg/dL (ref 8.5–10.2)
CHLORIDE: 106 mmol/L (ref 98–107)
CREATININE: 6.28 mg/dL — ABNORMAL HIGH (ref 0.60–1.00)
EGFR CKD-EPI AA FEMALE: 8 mL/min/{1.73_m2} — ABNORMAL LOW (ref >=60)
EGFR CKD-EPI NON-AA FEMALE: 7 mL/min/{1.73_m2} — ABNORMAL LOW (ref >=60)
GLUCOSE RANDOM: 123 mg/dL (ref 65–179)
PROTEIN TOTAL: 6 g/dL — ABNORMAL LOW (ref 6.5–8.3)

## 2018-06-13 LAB — PHOSPHORUS: Phosphate:MCnc:Pt:Ser/Plas:Qn:: 7.8 — ABNORMAL HIGH

## 2018-06-13 LAB — WBC ADJUSTED: Lab: 6.2

## 2018-06-13 LAB — HEMOGLOBIN AND HEMATOCRIT, BLOOD: HEMOGLOBIN: 7.7 g/dL — ABNORMAL LOW (ref 12.0–16.0)

## 2018-06-13 LAB — HEMATOCRIT: Lab: 23.6 — ABNORMAL LOW

## 2018-06-13 LAB — BUN / CREAT RATIO: Urea nitrogen/Creatinine:MRto:Pt:Ser/Plas:Qn:: 20

## 2018-06-13 LAB — MAGNESIUM: Magnesium:MCnc:Pt:Ser/Plas:Qn:: 2.2

## 2018-06-13 NOTE — Unmapped (Signed)
Pt remains on vent, ETT secure at the lip. BSS coarse. Suctioned for small thick tan secretions. No vent changes made this shift.

## 2018-06-13 NOTE — Unmapped (Signed)
MICU Progress Note     Date of Service: 06/13/2018    Problem List:       Interval history: Katherine Norton is a 62 y.o. female with  PMHx of liver transplant (1999) on immunosuppresion, chronic back pain, anxiety, HTN, CKD,  that presents to [facility] as a transfer from [facility], with respiratory failure and possible contained duodenal perforation.      24hr events: no events    Dispo: ICU    Neurological   #acute on chronic pain, sedation  #acute encephalopathy:  - presented to OSH altered, but also hypoxic at that time.  - improved prior to extubation and immed following re-intubation (FSC nodding y/n)  - mentation limited by sedation for vent compliance and comfort  - analegsia prn  - propofol dc'd 8/20  - inc'd oxy to 10 mg scheduled every 6 hrs 8/19      Pulmonary   #acute hypoxic respiratory failure likely due to multifocal PNA, ARDS.  Intubated on 8/1 at OSH CT chest with Scattered airspace opacities in the left apex, left upper lobe and superior segment of left lower lobe; concerning for pneumonia.  Extubated 8/12 and reint 8/13 for progressive hypoxia and inc WOB   - tolerating PRVC well  - cont wean vent as tol  - continues to fail SBT d/t inc RR and low Vt (100s)    - Optimal PEEP 14 per esophogeal balloon eval (8/14)   - sedation for vent compliance   - goal sats >90%, pao2 >80, pco2 30-40  - pulm toilet as tol, HOB>30deg  - peridex for vap ppx       Cardiovascular   #acute HFrEF  #h/o hypertension   - hold home metoprolol and amlodipine   - prn hydralazine  - Echo 7/29: EF 65-70%, mod pulm HTN and borderline dilated RV   - repeat echo 8/16 EF >55%, severe RV fx and dilated RA, severe phtn, mod TR    #Hypotension:  - developed pressor requirement 8/15  - Continue norepi gtt for MAP>65    Renal   #Acute on chronic renal failure  - Cr baseline appears to be ~1.7--> 3-->2.87  - Was on diuresis regimen at OSH, but was net pos due to enteral feeds  - Trend and may require CRRT or diuresis regimen  - Trial of 40mg  lasix 8/13 w/ good output but decent bump in Cr. Will hold further diuresis   - Cr continues to rise  - nephrology consulted 8/16, appreciate recs >> holding on CRRT for now for family discussions    Infectious Disease/Autoimmune   #septic shock due to ? PNA.  Initially seen at OSH and had c/f perforated duodenal ulcer, but CT scan here non concerning.  CT chest with possible multifocal PNA.    - appreciate ID's assistance. ID signed off 8/17  - S/p bronch 8/13 w/ no growth  - f/u pan cx sent 8/10, 8/11 >> ngtd  - f/u BAL 8/13, blood/urine 8/15  - WOCN consulted for intertrigo and sacral wound (see recs 8/19)      Abx:   Vanco (8/11-->8/12)  Mica (8/11-->8/12)  Flagyl (8/10-->8/14)  Cefepime  (8/10-->8/16)  Voriconazole (8/12-->8/15)  Linezolid (8/15-8/16)    Abx: OSH  ceftaroline and flagyl  levaquin  meropenem     Cx OSH:  - 7/29 Strep pneumo Ag neg, RVP neg, urine Legionella Ag neg  - 7/29 bd cx NGTD  OSH: Fungal, pneumocystis/histoplasma, AFB, Gram on BAL all  were negative.  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  Per surgery, NO perf.      #abd pain:   - pt endorsed abd pain, prior to reintubation >> cont to have tenderness on palpation  - lipase elevated on 8/11, f/u in am       Malnutrition Assessment: Not done yet.   - Tol Tf well at goal  - FMS for liquid stools, output unchanged   - consider cdiff if spikes temp or new leukocytosis    Heme/Coag   Acute blood loss- no s/s of bleeding  - Hgb drop to 5.8 at OSH with transfusion  - cbc stable.    - DVT ppx held due to drop in h/h.  Restarted.    - Required 2 units PRBC 8/20-8/21, no s/s of bleeding    Endocrine   nai    Prophylaxis/LDA/Restraints/Consults Can CVC be removed? No: need for medications requiring central access (e.g. pressors)   Can A-line be removed? No: frequent ABGs  Can Foley be removed? No: Need continuous I/O  Mobility plan: Step 1 - Range of motion    Feeding: Tube feeds at goal  Analgesia: Pain adequately controlled  Sedation SAT/SBT: Yes  Thrombembolic ppx: SQ heparin  Head of bed: >30 deg  Ulcer ppx: Yes, mechanical ventilation > 3 days, not on tube feeds  Glucose within target range: Yes, in range    RASS at goal? Yes  [place] Agitation Assessment Scale (RASS) : -1 (06/13/2018  6:00 AM)     Can antipsychotics be stopped? N/A, not on antipsychotics  CAM-ICU Result: Positive (06/12/2018  8:00 PM)      Patient Lines/Drains/Airways Status    Active Active Lines, Drains, & Airways     Name:   Placement date:   Placement time:   Site:   Days:    ETT  7.5   06/05/18    0037     8    CVC Triple Lumen 06/03/18 Non-tunneled Right Internal jugular   06/03/18    1503    Internal jugular   9    NG/OG Tube Right nostril   06/02/18    2121    Right nostril   10    Urethral Catheter   06/02/18    2118    ???   10    Arterial Line 06/03/18 Right Radial   06/03/18    0007    Radial   10              Patient Lines/Drains/Airways Status    Active Wounds     Name:   Placement date:   Placement time:   Site:   Days:    Wound 06/08/18 Pressure Injury Buttocks Posterior pink Stage 2   06/08/18    0001    Buttocks   5    Wound 06/09/18 Rash/Dermititis Perineum   06/09/18    2154    Perineum   3                Goals of Care     Code Status: Full Code    Designated Healthcare Decision Maker:  [name] currently lacks decisional  capacity for healthcare decision-making and is unable to designate a surrogate healthcare decision maker. [name] designated healthcare decision maker(s) is/are Katherine Norton and Katherine Norton (the patient's adult child) as denoted by hospital policy for patients without a known preference.      Subjective   Katherine Norton is a 62 y.o. female with PMHx of liver transplant (1999) on immunosuppresion, chronic back pain, anxiety, HTN, CKD,  that presents to [facility] as a transfer from [facility]. Patient initially presented to the OSH on 05/20/18 due to acute worsening shortness of breath and altered mental status. Patient was sating 68% on RA, but 90% on CPAP. Patient had severe acidemia on ABG as well as acute on chronic renal failure with Cr of 3.89. CXR at that time showed consolidation with concern for pneumonia and was febrile. Patient's initial lactate was 2.70. Patient was started on IV ceftaroline and flagyl initially.      Objective     Vitals - past 24 hours  Temp:  [36.9 ??C (98.5 ??F)] 36.9 ??C (98.5 ??F)  Heart Rate:  [98-118] 102  SpO2 Pulse:  [98-117] 101  Resp:  [17-31] 24  FiO2 (%):  [1 %-30 %] 30 %  SpO2:  [91 %-98 %] 97 % Intake/Output  I/O last 3 completed shifts:  In: 2624.4 [I.V.:229.4; Blood:400; NG/GT:1995]  Out: 737 [Urine:137; Stool:600]     Physical Exam:    General: obese female, awakens to stimuli, nods appropriately  HEENT: PERRLA  CV: RRR, no mrg  Pulm: coarse bs bilaterally  GI: soft, distended, non-tender  MSK: no edema  Skin: intact  Neuro: awake, FC, mae      Continuous Infusions:   ??? norepinephrine bitartrate-NS 5 mcg/min (06/13/18 0258)       Scheduled Medications:   ??? calcitonin (salmon)  1 spray Alternating Nares Daily (RT)   ??? chlorhexidine  5 mL Mouth BID   ??? cycloSPORINE  50 mg NG tube Daily    And   ??? cycloSPORINE  25 mg NG tube Nightly (2000)   ??? esomeprazole  40 mg NG tube daily   ??? heparin (porcine) for subcutaneous use  5,000 Units Subcutaneous [facility]   ??? oxyCODONE  10 mg NG tube Q6H   ??? potassium chloride  40 mEq Oral Once   ??? sodium bicarbonate  650 mg NG tube 4x Daily       PRN medications:      Data/Imaging Review: Reviewed in Epic and personally interpreted on 06/13/2018. See EMR for detailed results.      Critical Care Attestation     This patient is critically ill or injured with the impairment of vital organ systems such that there is a high probability of imminent or life threatening deterioration in the patient's condition. This patient must remain in the ICU for ongoing evaluation of the comprehensive management plan outlined in this note. I directly provided critical care services as documented in this note and the critical care time spent ( ) is exclusive of separately billable procedures.    Daryel November, ACNP

## 2018-06-13 NOTE — Unmapped (Signed)
Patient intermittently following commands. Remains intubated. Failed SBT. HR ST. Remains on levo to achieve MAP of 65. Afebrile. TF at goal, FMS in place. Foley in place, urine output minimal, MDI aware. Will continue to monitor.

## 2018-06-13 NOTE — Unmapped (Signed)
Pt settings have not changed overnight, Pt continually fails SBT trials due to increased agitation, RR, BP and low vt. Pt suctioned throughout shift for thick, tan secretions. Pt currently in NAD at this time.

## 2018-06-14 DIAGNOSIS — J9601 Acute respiratory failure with hypoxia: Principal | ICD-10-CM

## 2018-06-14 LAB — URINALYSIS
BILIRUBIN UA: NEGATIVE
GLUCOSE UA: NEGATIVE
KETONES UA: NEGATIVE
NITRITE UA: NEGATIVE
PH UA: 6 (ref 5.0–9.0)
PROTEIN UA: 100 — AB
RBC UA: 66 /HPF — ABNORMAL HIGH (ref <=4)
SPECIFIC GRAVITY UA: 1.013 (ref 1.003–1.030)
SQUAMOUS EPITHELIAL: 2 /HPF (ref 0–5)
UROBILINOGEN UA: 0.2
WBC UA: 41 /HPF — ABNORMAL HIGH (ref 0–5)

## 2018-06-14 LAB — COMPREHENSIVE METABOLIC PANEL
ALBUMIN: 2.4 g/dL — ABNORMAL LOW (ref 3.5–5.0)
ALKALINE PHOSPHATASE: 76 U/L (ref 38–126)
ALT (SGPT): <8 U/L — ABNORMAL LOW (ref 15–48)
ANION GAP: 15 mmol/L (ref 9–15)
AST (SGOT): 19 U/L (ref 14–38)
BILIRUBIN TOTAL: 0.7 mg/dL (ref 0.0–1.2)
BLOOD UREA NITROGEN: 138 mg/dL — ABNORMAL HIGH (ref 7–21)
BUN / CREAT RATIO: 21
CALCIUM: 8.5 mg/dL (ref 8.5–10.2)
CHLORIDE: 109 mmol/L — ABNORMAL HIGH (ref 98–107)
CO2: 17 mmol/L — ABNORMAL LOW (ref 22.0–30.0)
CREATININE: 6.72 mg/dL — ABNORMAL HIGH (ref 0.60–1.00)
EGFR CKD-EPI AA FEMALE: 7 mL/min/{1.73_m2} — ABNORMAL LOW (ref >=60)
EGFR CKD-EPI NON-AA FEMALE: 6 mL/min/{1.73_m2} — ABNORMAL LOW (ref >=60)
GLUCOSE RANDOM: 113 mg/dL (ref 65–179)
PROTEIN TOTAL: 6 g/dL — ABNORMAL LOW (ref 6.5–8.3)
SODIUM: 141 mmol/L (ref 135–145)

## 2018-06-14 LAB — CBC
HEMATOCRIT: 22.3 % — ABNORMAL LOW (ref 36.0–46.0)
HEMOGLOBIN: 7.1 g/dL — ABNORMAL LOW (ref 12.0–16.0)
MEAN CORPUSCULAR HEMOGLOBIN CONC: 31.7 g/dL (ref 31.0–37.0)
MEAN CORPUSCULAR HEMOGLOBIN: 30.7 pg (ref 26.0–34.0)
MEAN CORPUSCULAR VOLUME: 96.7 fL (ref 80.0–100.0)
MEAN PLATELET VOLUME: 12.2 fL — ABNORMAL HIGH (ref 7.0–10.0)
PLATELET COUNT: 85 10*9/L — ABNORMAL LOW (ref 150–440)
RED CELL DISTRIBUTION WIDTH: 17.8 % — ABNORMAL HIGH (ref 12.0–15.0)
WBC ADJUSTED: 4.4 10*9/L — ABNORMAL LOW (ref 4.5–11.0)

## 2018-06-14 LAB — MEAN CORPUSCULAR VOLUME: Lab: 96.7

## 2018-06-14 LAB — PHOSPHORUS: Phosphate:MCnc:Pt:Ser/Plas:Qn:: 7.1 — ABNORMAL HIGH

## 2018-06-14 LAB — BLOOD UREA NITROGEN: Urea nitrogen:MCnc:Pt:Ser/Plas:Qn:: 138 — ABNORMAL HIGH

## 2018-06-14 LAB — BILIRUBIN UA: Lab: NEGATIVE

## 2018-06-14 LAB — MAGNESIUM: Magnesium:MCnc:Pt:Ser/Plas:Qn:: 2.2

## 2018-06-14 LAB — CYCLOSPORINE (FPIA) BLOOD: Lab: 31

## 2018-06-14 NOTE — Unmapped (Signed)
MICU Progress Note     Date of Service: 06/14/2018    Problem List:       Interval history: Katherine Norton is a 62 y.o. female with  PMHx of liver transplant (1999) on immunosuppresion, chronic back pain, anxiety, HTN, CKD,  that presents to [facility] as a transfer from [facility], with respiratory failure and possible contained duodenal perforation.      24hr events: Spiked a fever overngiht, UA dirty and sputum impressive. Cefepime started. Fluc also started in the setting of yeast and cyclosporine.  Stopped these given that foley has been indwelling for some time. Will await culture data to indicate need for abx    Dispo: ICU    Neurological   #acute on chronic pain, sedation  #acute encephalopathy:  - presented to OSH altered, but also hypoxic at that time.  - improved prior to extubation and immed following re-intubation (FSC nodding y/n)  - mentation limited by sedation for vent compliance and comfort  - analegsia prn  - propofol dc'd 8/20  - inc'd oxy to 10 mg scheduled every 6 hrs 8/19  - pt alert 8/22 and in agreement w/ dialysis and trach if needed    Pulmonary   #acute hypoxic respiratory failure likely due to multifocal PNA, ARDS.  Intubated on 8/1 at OSH CT chest with Scattered airspace opacities in the left apex, left upper lobe and superior segment of left lower lobe; concerning for pneumonia.  Extubated 8/12 and reint 8/13 for progressive hypoxia and inc WOB   - tolerating PRVC well  - cont wean vent as tol  - continues to fail SBT d/t inc RR and low Vt (100s)    - Optimal PEEP 14 per esophogeal balloon eval (8/14)   - sedation for vent compliance   - goal sats >90%, pao2 >80, pco2 30-40  - pulm toilet as tol, HOB>30deg  - peridex for vap ppx       Cardiovascular   #acute HFrEF  #h/o hypertension   - hold home metoprolol and amlodipine   - prn hydralazine  - Echo 7/29: EF 65-70%, mod pulm HTN and borderline dilated RV   - repeat echo 8/16 EF >55%, severe RV fx and dilated RA, severe phtn, mod TR    #Hypotension:  - developed pressor requirement 8/15  - Continue norepi gtt for MAP>65    Renal   #Acute on chronic renal failure  - Cr baseline appears to be ~1.7--> 3-->2.87  - Was on diuresis regimen at OSH, but was net pos due to enteral feeds  - Trend and may require CRRT or diuresis regimen  - Trial of 40mg  lasix 8/13 w/ good output but decent bump in Cr. Will hold further diuresis   - Cr continues to rise  - nephrology consulted 8/16, appreciate recs >> plan to start dialysis as pt is agreeable as of 8/22    Infectious Disease/Autoimmune   #septic shock due to ? PNA.  Initially seen at OSH and had c/f perforated duodenal ulcer, but CT scan here non concerning.  CT chest with possible multifocal PNA.    - appreciate ID's assistance. ID signed off 8/17  - WOCN consulted for intertrigo and sacral wound (see recs 8/19)  - Blood/urine/sputum cxs sent overnight 8/20, abx given x 1 dose    Abx:   Cefepime (8/22- 8/22)  Fluconazole (8/22- 8/22)  Vanco (8/11-->8/12)  Mica (8/11-->8/12)  Flagyl (8/10-->8/14)  Cefepime  (8/10-->8/16)  Voriconazole (8/12-->8/15)  Linezolid (  8/15-8/16)    Abx: OSH  ceftaroline and flagyl  levaquin  meropenem     Cx OSH:  - 7/29 Strep pneumo Ag neg, RVP neg, urine Legionella Ag neg  - 7/29 bd cx NGTD  OSH: Fungal, pneumocystis/histoplasma, AFB, Gram on BAL all were negative.    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  Per surgery, NO perf.      #abd pain:   - pt endorsed abd pain, prior to reintubation >> cont to have tenderness on palpation  - lipase elevated on 8/11, f/u in am       Malnutrition Assessment: Not done yet.  Patient does not meet AND/ASPEN criteria for malnutrition at this time (06/13/18 1522)- Tol Tf well at goal  - FMS for liquid stools, output unchanged   - consider cdiff if spikes temp or new leukocytosis    Heme/Coag   Acute blood loss- no s/s of bleeding  - Hgb drop to 5.8 at OSH with transfusion  - cbc stable.    - DVT ppx held due to drop in h/h.  Restarted.    - Required 2 units PRBC 8/20-8/21, no s/s of bleeding    Endocrine   nai    Prophylaxis/LDA/Restraints/Consults   Can CVC be removed? No: need for medications requiring central access (e.g. pressors)   Can A-line be removed? No: frequent ABGs  Can Foley be removed? No: Need continuous I/O  Mobility plan: Step 1 - Range of motion    Feeding: Tube feeds at goal  Analgesia: Pain adequately controlled  Sedation SAT/SBT: Yes  Thrombembolic ppx: SQ heparin  Head of bed: >30 deg  Ulcer ppx: Yes, mechanical ventilation > 3 days, not on tube feeds  Glucose within target range: Yes, in range    RASS at goal? Yes  [place] Agitation Assessment Scale (RASS) : -1 (06/14/2018  6:00 AM)     Can antipsychotics be stopped? N/A, not on antipsychotics  CAM-ICU Result: Positive (06/13/2018  8:00 PM)      Patient Lines/Drains/Airways Status    Active Active Lines, Drains, & Airways     Name:   Placement date:   Placement time:   Site:   Days:    ETT  7.5   06/05/18    0037     9    CVC Triple Lumen 06/03/18 Non-tunneled Right Internal jugular   06/03/18    1503    Internal jugular   10    NG/OG Tube Right nostril   06/02/18    2121    Right nostril   11    Urethral Catheter   06/02/18    2118    ???   11    Arterial Line 06/03/18 Right Radial   06/03/18    0007    Radial   11              Patient Lines/Drains/Airways Status    Active Wounds     Name:   Placement date:   Placement time:   Site:   Days:    Wound 06/08/18 Pressure Injury Buttocks Posterior pink Stage 2   06/08/18    0001    Buttocks  6    Wound 06/09/18 Rash/Dermititis Perineum   06/09/18    2154    Perineum   4                Goals of Care     Code Status: Full Code    Designated Healthcare Decision Maker:  Ms. Loomer currently lacks decisional capacity for healthcare decision-making and is unable to designate a surrogate healthcare decision maker. Ms. Newhart designated healthcare decision maker(s) is/are dasani thurlow and [name] (the patient's adult child) as denoted by hospital policy for patients without a known preference.      Subjective   Katherine Norton is a 62 y.o. female with PMHx of liver transplant (1999) on immunosuppresion, chronic back pain, anxiety, HTN, CKD,  that presents to [facility] as a transfer from [facility] Patient initially presented to the OSH on 05/20/18 due to acute worsening shortness of breath and altered mental status. Patient was sating 68% on RA, but 90% on CPAP. Patient had severe acidemia on ABG as well as acute on chronic renal failure with Cr of 3.89. CXR at that time showed consolidation with concern for pneumonia and was febrile. Patient's initial lactate was 2.70. Patient was started on IV ceftaroline and flagyl initially.      Objective     Vitals - past 24 hours  Temp:  [37.2 ??C (99 ??F)-38.3 ??C (100.9 ??F)] 37.2 ??C (99 ??F)  Heart Rate:  [101-117] 108  SpO2 Pulse:  [101-116] 108  Resp:  [19-36] 24  BP: (104)/(61) 104/61  FiO2 (%):  [30 %] 30 %  SpO2:  [95 %-100 %] 97 % Intake/Output  I/O last 3 completed shifts:  In: 3172.4 [I.V.:427.4; Blood:280; NG/GT:2165; IV Piggyback:300]  Out: 655 [Urine:155; Stool:500]     Physical Exam:    General: obese female, awakens to stimuli, nods appropriately  HEENT: PERRLA  CV: RRR, no mrg  Pulm: coarse bs bilaterally  GI: soft, distended, non-tender  MSK: no edema  Skin: intact  Neuro: awake, FC, mae      Continuous Infusions:   ??? norepinephrine bitartrate-NS 2 mcg/min (06/14/18 0981)       Scheduled Medications:   ??? calcitonin (salmon)  1 spray Alternating Nares Daily (RT)   ??? Cefepime  2 g Intravenous Q24H   ??? chlorhexidine  5 mL Mouth BID   ??? cycloSPORINE  50 mg NG tube Daily    And   ??? cycloSPORINE  25 mg NG tube Nightly (2000)   ??? esomeprazole  40 mg NG tube daily   ??? fluconazole (DIFLUCAN) INTRAVENOUS  400 mg Intravenous Q24H   ??? heparin (porcine) for subcutaneous use  5,000 Units Subcutaneous [facility]   ??? oxyCODONE  10 mg NG tube Q6H   ??? sevelamer  800 mg NG tube [facility]   ??? sodium bicarbonate  650 mg NG tube 4x Daily       PRN medications:      Data/Imaging Review: Reviewed in Epic and personally interpreted on 06/14/2018. See EMR for detailed results.      Critical Care Attestation     This patient is critically ill or injured with the impairment of vital organ systems such that there is a high probability of imminent or life threatening deterioration in the patient's condition. This patient must remain in the ICU for ongoing evaluation of the comprehensive management plan outlined in this note. I directly provided critical care services as documented in this note and  the critical care time spent ( ) is exclusive of separately billable procedures.    Daryel November, ACNP

## 2018-06-14 NOTE — Unmapped (Signed)
Central Venous Catheter Insertion Procedure Note    Patient Name:: Katherine Norton  Patient MRN: [id number]    Line type:  Hemodialysis Catheter    Indications:  Inadequate peripheral access and Hemodialysis    Procedure Details:   Informed consent was obtained after explanation of the risks and benefits of the procedure, refer to the consent documentation.    Time-out was performed immediately prior to the procedure.    The left internal jugular vein was identified using bedside ultrasound. This area was prepped and draped in the usual sterile fashion. Maximum sterile technique was used including antiseptics, cap, gloves, gown, hand hygiene, mask, and sterile sheet.  The patient was placed in Trendelenburgs position. Local anesthesia with 1% lidocaine was applied subcutaneously then deep to the skin. The finder was then inserted into the internal jugular vein using ultrasound guidance.    Using the Seldinger Technique a Hemodialysis Catheter was placed with each port easily flushed and freely drawing venous blood.    The catheter was secured with sutures. A sterile bandage was placed over the site.    Condition:  The patient tolerated the procedure well and remains in the same condition as pre-procedure.    Complications:  None; patient tolerated the procedure well.    Plan:  CXR was ordered to verify placement.    Caesar Chestnut, NP

## 2018-06-14 NOTE — Unmapped (Signed)
Pt alert this morning and able to interact. In the presence of her brother, she was able to say that she was willing to undergo dialysis and a trach, even if it meant that she would live in a nursing facility for the rest of her life, worst case scenario. She really doesn't want a trach but would have one if it was deemed necessary to survive. We will proceed w/ dialysis at this time and attempt to optimize her for a trial of extubation if feasible.

## 2018-06-14 NOTE — Unmapped (Signed)
Pt remains intubated and current settings are PRVC 350 24 +14 30%. Will continue to monitor and wean as tolerated.

## 2018-06-14 NOTE — Unmapped (Signed)
Patient mental remains the same, opens eyes to voice or spontaneously. Unable to follow commands. Will focus. Pupils equal and reactive. Given morphine x1 for increased pain with adequate response. Otherwise patient calm with RASS 0/-1. Remains on APV. Tube feeds changes to Vital AF per nutrition recommendations. Changed to goal rate of 56mL/hr. Minimal UO; 35 mL for shift. ST in low 100's this shift. Norepi infusing at 5 mcg/min to maintain MAP >65. Afebrile. Given 1 unit PRBC for Hgb 6.9. No transfusion reaction noted. Family updated on plan of care. Plan remains to have GOC discussion once daughter arrives. Will continue to monitor.   Problem: Adult Inpatient Plan of Care  Goal: Plan of Care Review  Outcome: Ongoing - Unchanged  Flowsheets  Taken 06/13/2018 2004 by Herminio Commons, RN  Progress: no change  Taken 06/11/2018 0244 by Neva Seat, RN  Plan of Care Reviewed With: family  Goal: Patient-Specific Goal (Individualization)  Outcome: Ongoing - Unchanged  Goal: Absence of Hospital-Acquired Illness or Injury  Outcome: Ongoing - Unchanged  Goal: Optimal Comfort and Wellbeing  Outcome: Ongoing - Unchanged  Goal: Readiness for Transition of Care  Outcome: Ongoing - Unchanged  Goal: Rounds/Family Conference  Outcome: Ongoing - Unchanged     Problem: Latex Allergy  Goal: Absence of Allergy Symptoms  Outcome: Ongoing - Unchanged     Problem: Skin Injury Risk Increased  Goal: Skin Health and Integrity  Outcome: Ongoing - Unchanged     Problem: Fall Injury Risk  Goal: Absence of Fall and Fall-Related Injury  Outcome: Ongoing - Unchanged     Problem: Communication Impairment (Mechanical Ventilation, Invasive)  Goal: Effective Communication  Outcome: Ongoing - Unchanged     Problem: Device-Related Complication Risk (Mechanical Ventilation, Invasive)  Goal: Optimal Device Function  Outcome: Ongoing - Unchanged     Problem: Inability to Wean (Mechanical Ventilation, Invasive)  Goal: Mechanical Ventilation Liberation Outcome: Ongoing - Unchanged     Problem: Nutrition Impairment (Mechanical Ventilation, Invasive)  Goal: Optimal Nutrition Delivery  Outcome: Ongoing - Unchanged     Problem: Skin and Tissue Injury (Mechanical Ventilation, Invasive)  Goal: Absence of Device-Related Skin and Tissue Injury  Outcome: Ongoing - Unchanged     Problem: Ventilator-Induced Lung Injury (Mechanical Ventilation, Invasive)  Goal: Absence of Ventilator-Induced Lung Injury  Outcome: Ongoing - Unchanged     Problem: Self-Care Deficit  Goal: Improved Ability to Complete Activities of Daily Living  Outcome: Ongoing - Unchanged     Problem: Hypertension Comorbidity  Goal: Blood Pressure in Desired Range  Outcome: Ongoing - Unchanged     Problem: Pain Chronic (Persistent) (Comorbidity Management)  Goal: Acceptable Pain Control and Functional Ability  Outcome: Ongoing - Unchanged     Problem: Fluid Imbalance (Pneumonia)  Goal: Fluid Balance  Outcome: Ongoing - Unchanged     Problem: Infection (Pneumonia)  Goal: Resolution of Infection Signs/Symptoms  Outcome: Ongoing - Unchanged     Problem: Respiratory Compromise (Pneumonia)  Goal: Effective Oxygenation and Ventilation  Outcome: Ongoing - Unchanged

## 2018-06-14 NOTE — Unmapped (Signed)
Patient intermittently follows commands. Remains intubated. Thick secretions requiring patient to be lavaged frequently. HR St, BP MAPS maintained above 65. Levo weaned to . Tmax 38.3. Cultures sent. FMS in place. Foley in place with minimal urine output. Will continue to monitor.

## 2018-06-14 NOTE — Unmapped (Signed)
Cyclosporine Therapeutic Monitoring Pharmacy Note    Klaryssa Fauth is a 62 y.o. female continuing cyclosporine NEORAL.     Indication: Liver transplant     Date of Transplant: 05/15/1995      Prior Dosing Information: Current regimen 50mg  qAM, 25mg  qPM      Goals:  Therapeutic Drug Levels  Cyclosporine trough goal: 50-100 ng/mL    Additional Clinical Monitoring/Outcomes  ? Monitor renal function (SCr and urine output) and liver function (LFTs)  ? Monitor for signs/symptoms of adverse events (e.g., hyperglycemia, hyperkalemia, hypomagnesemia, hypertension, headache, tremor)    Results:   Cyclosporine level: 31 ng/mL, drawn 2.5 hours early    Pharmacokinetic Considerations and Significant Drug Interactions:  ? Concurrent hepatotoxic medications: None identified  ? Concurrent CYP3A4 substrates/inhibitors: None identified  ? Concurrent nephrotoxic medications: None identified    Assessment/Plan:  Recommendation(s)  ? Increase to 50mg  BID    Follow-up  ? Next level has been ordered on 06/18/2018 at prior to AM dose.   ? A pharmacist will continue to monitor and recommend levels as appropriate    Please page service pharmacist with questions/clarifications.    Simonne Martinet, PharmD Candidate

## 2018-06-14 NOTE — Unmapped (Signed)
Pt settings remained unchanged, pt continues to be unable to pass SBT. Pt suctioned frequently overnight for thick, tan, mucus plug secretions. Sample sent to lab. PT currently NAD a this time.

## 2018-06-15 DIAGNOSIS — J9601 Acute respiratory failure with hypoxia: Principal | ICD-10-CM

## 2018-06-15 LAB — COMPREHENSIVE METABOLIC PANEL
ALBUMIN: 2.4 g/dL — ABNORMAL LOW (ref 3.5–5.0)
ALBUMIN: 2.5 g/dL — ABNORMAL LOW (ref 3.5–5.0)
ALKALINE PHOSPHATASE: 68 U/L (ref 38–126)
ALKALINE PHOSPHATASE: 65 U/L (ref 38–126)
ALKALINE PHOSPHATASE: 74 U/L (ref 38–126)
ALT (SGPT): 19 U/L (ref 15–48)
ALT (SGPT): 14 U/L — ABNORMAL LOW (ref 15–48)
ALT (SGPT): 12 U/L — ABNORMAL LOW (ref 15–48)
ANION GAP: 14 mmol/L (ref 9–15)
ANION GAP: 7 mmol/L — ABNORMAL LOW (ref 9–15)
ANION GAP: 6 mmol/L — ABNORMAL LOW (ref 9–15)
AST (SGOT): 23 U/L (ref 14–38)
AST (SGOT): 25 U/L (ref 14–38)
BILIRUBIN TOTAL: 0.9 mg/dL (ref 0.0–1.2)
BILIRUBIN TOTAL: 1 mg/dL (ref 0.0–1.2)
BILIRUBIN TOTAL: 0.8 mg/dL (ref 0.0–1.2)
BLOOD UREA NITROGEN: 100 mg/dL — ABNORMAL HIGH (ref 7–21)
BLOOD UREA NITROGEN: 68 mg/dL — ABNORMAL HIGH (ref 7–21)
BUN / CREAT RATIO: 22
BUN / CREAT RATIO: 21
BUN / CREAT RATIO: 21
CALCIUM: 8.5 mg/dL (ref 8.5–10.2)
CALCIUM: 8.6 mg/dL (ref 8.5–10.2)
CALCIUM: 8.8 mg/dL (ref 8.5–10.2)
CHLORIDE: 107 mmol/L (ref 98–107)
CHLORIDE: 106 mmol/L (ref 98–107)
CO2: 20 mmol/L — ABNORMAL LOW (ref 22.0–30.0)
CO2: 25 mmol/L (ref 22.0–30.0)
CREATININE: 4.55 mg/dL — ABNORMAL HIGH (ref 0.60–1.00)
CREATININE: 4.23 mg/dL — ABNORMAL HIGH (ref 0.60–1.00)
CREATININE: 3.2 mg/dL — ABNORMAL HIGH (ref 0.60–1.00)
EGFR CKD-EPI AA FEMALE: 11 mL/min/{1.73_m2} — ABNORMAL LOW (ref >=60)
EGFR CKD-EPI AA FEMALE: 12 mL/min/{1.73_m2} — ABNORMAL LOW (ref >=60)
EGFR CKD-EPI NON-AA FEMALE: 10 mL/min/{1.73_m2} — ABNORMAL LOW (ref >=60)
EGFR CKD-EPI NON-AA FEMALE: 11 mL/min/{1.73_m2} — ABNORMAL LOW (ref >=60)
EGFR CKD-EPI NON-AA FEMALE: 15 mL/min/{1.73_m2} — ABNORMAL LOW (ref >=60)
GLUCOSE RANDOM: 156 mg/dL (ref 65–179)
GLUCOSE RANDOM: 97 mg/dL (ref 65–179)
GLUCOSE RANDOM: 125 mg/dL (ref 65–179)
POTASSIUM: 3.7 mmol/L (ref 3.5–5.0)
POTASSIUM: 3.9 mmol/L (ref 3.5–5.0)
POTASSIUM: 4 mmol/L (ref 3.5–5.0)
PROTEIN TOTAL: 6 g/dL — ABNORMAL LOW (ref 6.5–8.3)
PROTEIN TOTAL: 6.4 g/dL — ABNORMAL LOW (ref 6.5–8.3)
SODIUM: 141 mmol/L (ref 135–145)
SODIUM: 137 mmol/L (ref 135–145)
SODIUM: 137 mmol/L (ref 135–145)

## 2018-06-15 LAB — BLOOD GAS CRITICAL CARE PANEL, ARTERIAL
BASE EXCESS ARTERIAL: -2.8 — ABNORMAL LOW (ref -2.0–2.0)
BASE EXCESS ARTERIAL: -0.5 (ref -2.0–2.0)
BASE EXCESS ARTERIAL: 0.9 (ref -2.0–2.0)
CALCIUM IONIZED ARTERIAL (MG/DL): 4.94 mg/dL (ref 4.40–5.40)
CALCIUM IONIZED ARTERIAL (MG/DL): 4.89 mg/dL (ref 4.40–5.40)
CALCIUM IONIZED ARTERIAL (MG/DL): 5.02 mg/dL (ref 4.40–5.40)
GLUCOSE WHOLE BLOOD: 171 mg/dL
GLUCOSE WHOLE BLOOD: 104 mg/dL
GLUCOSE WHOLE BLOOD: 136 mg/dL
HCO3 ARTERIAL: 25 mmol/L (ref 22–27)
HEMOGLOBIN BLOOD GAS: 6.8 g/dL — ABNORMAL LOW (ref 12.00–16.00)
HEMOGLOBIN BLOOD GAS: 6.3 g/dL — ABNORMAL LOW (ref 12.00–16.00)
HEMOGLOBIN BLOOD GAS: 7.4 g/dL — ABNORMAL LOW (ref 12.00–16.00)
LACTATE BLOOD ARTERIAL: 0.7 mmol/L (ref <=1.2)
LACTATE BLOOD ARTERIAL: 0.6 mmol/L (ref <=1.2)
LACTATE BLOOD ARTERIAL: 0.7 mmol/L (ref <=1.2)
O2 SATURATION ARTERIAL: 97.8 % (ref 94.0–100.0)
O2 SATURATION ARTERIAL: 99 % (ref 94.0–100.0)
PCO2 ARTERIAL: 41.6 mmHg (ref 35.0–45.0)
PCO2 ARTERIAL: 35 mmHg (ref 35.0–45.0)
PCO2 ARTERIAL: 40.5 mmHg (ref 35.0–45.0)
PH ARTERIAL: 7.34 — ABNORMAL LOW (ref 7.35–7.45)
PH ARTERIAL: 7.41 (ref 7.35–7.45)
PO2 ARTERIAL: 96.9 mmHg (ref 80.0–110.0)
PO2 ARTERIAL: 115 mmHg — ABNORMAL HIGH (ref 80.0–110.0)
PO2 ARTERIAL: 105 mmHg (ref 80.0–110.0)
POTASSIUM WHOLE BLOOD: 3.7 mmol/L (ref 3.4–4.6)
POTASSIUM WHOLE BLOOD: 4 mmol/L (ref 3.4–4.6)
SODIUM WHOLE BLOOD: 143 mmol/L (ref 135–145)
SODIUM WHOLE BLOOD: 142 mmol/L (ref 135–145)
SODIUM WHOLE BLOOD: 141 mmol/L (ref 135–145)

## 2018-06-15 LAB — CBC
HEMOGLOBIN: 7.1 g/dL — ABNORMAL LOW (ref 12.0–16.0)
HEMOGLOBIN: 6.4 g/dL — ABNORMAL LOW (ref 12.0–16.0)
HEMOGLOBIN: 7.5 g/dL — ABNORMAL LOW (ref 12.0–16.0)
MEAN CORPUSCULAR HEMOGLOBIN CONC: 32.8 g/dL (ref 31.0–37.0)
MEAN CORPUSCULAR HEMOGLOBIN CONC: 31.8 g/dL (ref 31.0–37.0)
MEAN CORPUSCULAR HEMOGLOBIN CONC: 32.2 g/dL (ref 31.0–37.0)
MEAN CORPUSCULAR HEMOGLOBIN: 32.1 pg (ref 26.0–34.0)
MEAN CORPUSCULAR HEMOGLOBIN: 30.8 pg (ref 26.0–34.0)
MEAN CORPUSCULAR HEMOGLOBIN: 31.1 pg (ref 26.0–34.0)
MEAN CORPUSCULAR VOLUME: 97.9 fL (ref 80.0–100.0)
MEAN CORPUSCULAR VOLUME: 96.7 fL (ref 80.0–100.0)
MEAN CORPUSCULAR VOLUME: 96.5 fL (ref 80.0–100.0)
MEAN PLATELET VOLUME: 12.2 fL — ABNORMAL HIGH (ref 7.0–10.0)
MEAN PLATELET VOLUME: 11.8 fL — ABNORMAL HIGH (ref 7.0–10.0)
PLATELET COUNT: 86 10*9/L — ABNORMAL LOW (ref 150–440)
PLATELET COUNT: 101 10*9/L — ABNORMAL LOW (ref 150–440)
RED BLOOD CELL COUNT: 2.07 10*12/L — ABNORMAL LOW (ref 4.00–5.20)
RED BLOOD CELL COUNT: 2.4 10*12/L — ABNORMAL LOW (ref 4.00–5.20)
RED CELL DISTRIBUTION WIDTH: 18.2 % — ABNORMAL HIGH (ref 12.0–15.0)
RED CELL DISTRIBUTION WIDTH: 18.4 % — ABNORMAL HIGH (ref 12.0–15.0)
RED CELL DISTRIBUTION WIDTH: 18 % — ABNORMAL HIGH (ref 12.0–15.0)
WBC ADJUSTED: 4 10*9/L — ABNORMAL LOW (ref 4.5–11.0)
WBC ADJUSTED: 3.4 10*9/L — ABNORMAL LOW (ref 4.5–11.0)
WBC ADJUSTED: 4.1 10*9/L — ABNORMAL LOW (ref 4.5–11.0)

## 2018-06-15 LAB — APTT
APTT: 31.9 s (ref 25.9–39.5)
Coagulation surface induced:Time:Pt:PPP:Qn:Coag: 31.9
HEPARIN CORRELATION: 0.2

## 2018-06-15 LAB — PLATELET COUNT: Lab: 95 — ABNORMAL LOW

## 2018-06-15 LAB — MAGNESIUM
Magnesium:MCnc:Pt:Ser/Plas:Qn:: 1.9
Magnesium:MCnc:Pt:Ser/Plas:Qn:: 1.9
Magnesium:MCnc:Pt:Ser/Plas:Qn:: 1.9

## 2018-06-15 LAB — SODIUM: Sodium:SCnc:Pt:Ser/Plas:Qn:: 141

## 2018-06-15 LAB — PHOSPHORUS
Phosphate:MCnc:Pt:Ser/Plas:Qn:: 3.7
Phosphate:MCnc:Pt:Ser/Plas:Qn:: 4.6
Phosphate:MCnc:Pt:Ser/Plas:Qn:: 4.8 — ABNORMAL HIGH

## 2018-06-15 LAB — INR
Lab: 1.18
Lab: 1.22
Lab: 1.23

## 2018-06-15 LAB — POTASSIUM WHOLE BLOOD
Potassium:SCnc:Pt:Bld:Qn:: 3.7
Potassium:SCnc:Pt:Bld:Qn:: 4

## 2018-06-15 LAB — HEPARIN CORRELATION
Lab: 0.2
Lab: 0.2

## 2018-06-15 LAB — BLOOD UREA NITROGEN: Urea nitrogen:MCnc:Pt:Ser/Plas:Qn:: 88 — ABNORMAL HIGH

## 2018-06-15 LAB — PROTIME-INR
INR: 1.22
PROTIME: 13.5 s — ABNORMAL HIGH (ref 10.2–12.8)

## 2018-06-15 LAB — RED BLOOD CELL COUNT: Lab: 2.2 — ABNORMAL LOW

## 2018-06-15 LAB — MEAN PLATELET VOLUME: Lab: 11.1 — ABNORMAL HIGH

## 2018-06-15 LAB — SPECIMEN SOURCE

## 2018-06-15 LAB — CHLORIDE: Chloride:SCnc:Pt:Ser/Plas:Qn:: 106

## 2018-06-15 NOTE — Unmapped (Signed)
Pt remains vented and current settings are 18/14 30%. Pt transitioned to PSV this morning. ETT holder changed out on shift and is secure. Will continue to monitor.

## 2018-06-15 NOTE — Unmapped (Signed)
Problem: Communication Impairment (Mechanical Ventilation, Invasive)  Goal: Effective Communication  Outcome: Ongoing - Unchanged     Problem: Device-Related Complication Risk (Mechanical Ventilation, Invasive)  Goal: Optimal Device Function  Outcome: Ongoing - Unchanged     Problem: Inability to Wean (Mechanical Ventilation, Invasive)  Goal: Mechanical Ventilation Liberation  Outcome: Ongoing - Unchanged     Problem: Skin and Tissue Injury (Mechanical Ventilation, Invasive)  Goal: Absence of Device-Related Skin and Tissue Injury  Outcome: Ongoing - Unchanged     Problem: Ventilator-Induced Lung Injury (Mechanical Ventilation, Invasive)  Goal: Absence of Ventilator-Induced Lung Injury  Outcome: Ongoing - Unchanged  Corinna Capra Boglioli, RRT  06/15/2018  Pt vomited moderately after bath, pt seemed agitated most of the night.  Pt RR had increased and pt was returned to [facility] ventilation.  Pt was also given medication to help relax.  No other concerns.

## 2018-06-15 NOTE — Unmapped (Signed)
Afebrile. ST 100s at rest. SpO2 > 94%, remains intubated, transitioned to PSV this morning, desat to 80s x 3 with turns to L, recovered once returned flat with HOB elevated on 100% FiO2. Foley remains in place, diminished output. Vas-cath placed by NP, to be started on CRRT today. FMS in place, draining green/brown liquid stool. PRN morphine given x 1 per NAPS assessment for pain during line placement, good response. Family at bedside. Updated on plan of care. Will continue to monitor closely.     Problem: Adult Inpatient Plan of Care  Goal: Plan of Care Review  Outcome: Ongoing - Unchanged  Goal: Patient-Specific Goal (Individualization)  Outcome: Ongoing - Unchanged  Goal: Absence of Hospital-Acquired Illness or Injury  Outcome: Ongoing - Unchanged  Goal: Optimal Comfort and Wellbeing  Outcome: Ongoing - Unchanged  Goal: Readiness for Transition of Care  Outcome: Ongoing - Unchanged  Goal: Rounds/Family Conference  Outcome: Ongoing - Unchanged     Problem: Latex Allergy  Goal: Absence of Allergy Symptoms  Outcome: Ongoing - Unchanged     Problem: Skin Injury Risk Increased  Goal: Skin Health and Integrity  Outcome: Ongoing - Unchanged     Problem: Fall Injury Risk  Goal: Absence of Fall and Fall-Related Injury  Outcome: Ongoing - Unchanged     Problem: Communication Impairment (Mechanical Ventilation, Invasive)  Goal: Effective Communication  Outcome: Ongoing - Unchanged     Problem: Device-Related Complication Risk (Mechanical Ventilation, Invasive)  Goal: Optimal Device Function  Outcome: Ongoing - Unchanged     Problem: Inability to Wean (Mechanical Ventilation, Invasive)  Goal: Mechanical Ventilation Liberation  Outcome: Ongoing - Unchanged     Problem: Nutrition Impairment (Mechanical Ventilation, Invasive)  Goal: Optimal Nutrition Delivery  Outcome: Ongoing - Unchanged     Problem: Skin and Tissue Injury (Mechanical Ventilation, Invasive)  Goal: Absence of Device-Related Skin and Tissue Injury  Outcome: Ongoing - Unchanged     Problem: Ventilator-Induced Lung Injury (Mechanical Ventilation, Invasive)  Goal: Absence of Ventilator-Induced Lung Injury  Outcome: Ongoing - Unchanged     Problem: Self-Care Deficit  Goal: Improved Ability to Complete Activities of Daily Living  Outcome: Ongoing - Unchanged     Problem: Hypertension Comorbidity  Goal: Blood Pressure in Desired Range  Outcome: Ongoing - Unchanged     Problem: Pain Chronic (Persistent) (Comorbidity Management)  Goal: Acceptable Pain Control and Functional Ability  Outcome: Ongoing - Unchanged     Problem: Fluid Imbalance (Pneumonia)  Goal: Fluid Balance  Outcome: Ongoing - Unchanged     Problem: Infection (Pneumonia)  Goal: Resolution of Infection Signs/Symptoms  Outcome: Ongoing - Unchanged     Problem: Respiratory Compromise (Pneumonia)  Goal: Effective Oxygenation and Ventilation  Outcome: Ongoing - Unchanged

## 2018-06-15 NOTE — Unmapped (Signed)
MICU Progress Note     Date of Service: 06/15/2018    Problem List:       Interval history: Katherine Norton is a 62 y.o. female with  PMHx of liver transplant (1999) on immunosuppresion, chronic back pain, anxiety, HTN, CKD,  that presents to [facility] as a transfer from [facility], with respiratory failure and possible contained duodenal perforation.      24hr events: No further fevers. Did have large volume emesis overnight. Tolerating UF of -200    Dispo: ICU    Neurological   #acute on chronic pain, sedation  #acute encephalopathy:  - presented to OSH altered, but also hypoxic at that time.  - improved prior to extubation and immed following re-intubation (FSC nodding y/n)  - mentation limited by sedation for vent compliance and comfort  - analegsia prn  - propofol dc'd 8/20  - inc'd oxy to 10 mg scheduled every 6 hrs 8/19  - pt alert 8/22 and in agreement w/ dialysis and trach if needed    Pulmonary   #acute hypoxic respiratory failure likely due to multifocal PNA, ARDS.  Intubated on 8/1 at OSH CT chest with Scattered airspace opacities in the left apex, left upper lobe and superior segment of left lower lobe; concerning for pneumonia.  Extubated 8/12 and reint 8/13 for progressive hypoxia and inc WOB   - tolerating PRVC well  - cont wean vent as tol  - continues to fail SBT d/t inc RR and low Vt (100s)    - Optimal PEEP 14 per esophogeal balloon eval (8/14)   - pulm toilet as tol, HOB>30deg  - peridex for vap ppx       Cardiovascular   #acute HFrEF  #h/o hypertension   - hold home metoprolol and amlodipine   - prn hydralazine  - Echo 7/29: EF 65-70%, mod pulm HTN and borderline dilated RV   - repeat echo 8/16 EF >55%, severe RV fx and dilated RA, severe phtn, mod TR    #Hypotension:  - developed pressor requirement 8/15  - Continue norepi gtt for MAP>65, currently off 8/22    Renal   #Acute on chronic renal failure  - Cr baseline appears to be ~1.7--> 3-->2.87  - nephrology consulted 8/16, appreciate recs >>   - CRRT started 8/22    Infectious Disease/Autoimmune   #septic shock due to ? PNA.  Initially seen at OSH and had c/f perforated duodenal ulcer, but CT scan here non concerning.  CT chest with possible multifocal PNA.    - appreciate ID's assistance. ID signed off 8/17  - WOCN consulted for intertrigo and sacral wound (see recs 8/19)  - Blood/urine/sputum cxs sent overnight 8/20, abx given x 1 dose    Abx:   Cefepime (8/22- 8/22)  Fluconazole (8/22- 8/22)  Vanco (8/11-->8/12)  Mica (8/11-->8/12)  Flagyl (8/10-->8/14)  Cefepime  (8/10-->8/16)  Voriconazole (8/12-->8/15)  Linezolid (8/15-8/16)    Abx: OSH  ceftaroline and flagyl  levaquin  meropenem     Cx OSH:  - 7/29 Strep pneumo Ag neg, RVP neg, urine Legionella Ag neg  - 7/29 bd cx NGTD  OSH: Fungal, pneumocystis/histoplasma, AFB, Gram on BAL all were negative.    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  Per surgery, NO perf.      #abd pain:   - pt endorsed abd pain, prior to reintubation >> cont to have tenderness on palpation  - lipase elevated on 8/11, f/u in am       Malnutrition Assessment: Not done yet.  Patient does not meet AND/ASPEN criteria for malnutrition at this time (06/13/18 1522)- Tol Tf well at goal  - FMS for liquid stools, output unchanged   - consider cdiff if spikes temp or new leukocytosis    Heme/Coag   Acute blood loss- no s/s of bleeding  - Hgb drop to 5.8 at OSH with transfusion  - cbc stable.    - DVT ppx held due to drop in h/h.  Restarted.    - Required 2 units PRBC 8/20-8/21, no s/s of bleeding    Endocrine   nai    Prophylaxis/LDA/Restraints/Consults   Can CVC be removed? No: need for medications requiring central access (e.g. pressors) Can A-line be removed? No: frequent ABGs  Can Foley be removed? No: Need continuous I/O  Mobility plan: Step 1 - Range of motion    Feeding: Tube feeds at goal  Analgesia: Pain adequately controlled  Sedation SAT/SBT: Yes  Thrombembolic ppx: SQ heparin  Head of bed: >30 deg  Ulcer ppx: Yes, mechanical ventilation > 3 days, not on tube feeds  Glucose within target range: Yes, in range    RASS at goal? Yes  [place] Agitation Assessment Scale (RASS) : -1 (06/15/2018  6:00 AM)     Can antipsychotics be stopped? N/A, not on antipsychotics  CAM-ICU Result: Positive (06/14/2018  8:00 PM)      Patient Lines/Drains/Airways Status    Active Active Lines, Drains, & Airways     Name:   Placement date:   Placement time:   Site:   Days:    ETT  7.5   06/05/18    0037     10    CVC Triple Lumen 06/03/18 Non-tunneled Right Internal jugular   06/03/18    1503    Internal jugular   11    NG/OG Tube Right nostril   06/02/18    2121    Right nostril   12    Urethral Catheter   06/02/18    2118    ???   12    Arterial Line 06/03/18 Right Radial   06/03/18    0007    Radial   12    Hemodialysis Catheter 06/14/18 Left Internal jugular   06/14/18    1400    Internal jugular   less than 1              Patient Lines/Drains/Airways Status    Active Wounds     Name:   Placement date:   Placement time:   Site:   Days:    Wound 06/08/18 Pressure Injury Buttocks Posterior pink Stage 2   06/08/18    0001    Buttocks   7    Wound 06/09/18 Rash/Dermititis Perineum   06/09/18    2154    Perineum   5                Goals of Care     Code Status: Full Code    Designated Healthcare Decision Maker:  Katherine Norton currently lacks decisional capacity for healthcare decision-making and is unable to designate a surrogate healthcare  decision maker. Katherine Norton designated healthcare decision maker(s) is/are Katherine Norton and Katherine Norton (the patient's adult child) as denoted by hospital policy for patients without a known preference.      Subjective   Katherine Norton is a 62 y.o. female with PMHx of liver transplant (1999) on immunosuppresion, chronic back pain, anxiety, HTN, CKD,  that presents to [facility] as a transfer from [facility]. Patient initially presented to the OSH on 05/20/18 due to acute worsening shortness of breath and altered mental status. Patient was sating 68% on RA, but 90% on CPAP. Patient had severe acidemia on ABG as well as acute on chronic renal failure with Cr of 3.89. CXR at that time showed consolidation with concern for pneumonia and was febrile. Patient's initial lactate was 2.70. Patient was started on IV ceftaroline and flagyl initially.      Objective     Vitals - past 24 hours  Temp:  [36.3 ??C (97.4 ??F)-36.6 ??C (97.9 ??F)] 36.3 ??C (97.4 ??F)  Heart Rate:  [85-111] 85  SpO2 Pulse:  [85-111] 85  Resp:  [18-46] 24  FiO2 (%):  [30 %-61 %] 30 %  SpO2:  [90 %-100 %] 100 % Intake/Output  I/O last 3 completed shifts:  In: 2911.4 [I.V.:228.4; NG/GT:2310; IV Piggyback:373]  Out: 2701 [Urine:306; ZOXWR:6045; Stool:1000]     Physical Exam:    General: obese female, awakens to stimuli, nods appropriately  HEENT: PERRLA  CV: RRR, no mrg  Pulm: coarse bs bilaterally  GI: soft, distended, non-tender  MSK: no edema  Skin: intact  Neuro: awake, FC, mae      Continuous Infusions:   ??? norepinephrine bitartrate-NS Stopped (06/14/18 1600)   ??? NxStage RFP 400 5000 mL - contains 2 mEq/L of potassium     ??? NxStage RFP 401 5000 mL - contains 4 mEq/L of potassium     ??? NxStage RFP 401 BB 5000 mL - contains 4 mEq/L of potassium         Scheduled Medications:   ??? calcitonin (salmon)  1 spray Alternating Nares Daily (RT)   ??? chlorhexidine  5 mL Mouth BID   ??? cycloSPORINE  50 mg NG tube BID   ??? esomeprazole  40 mg NG tube daily   ??? heparin (porcine) for subcutaneous use  5,000 Units Subcutaneous [facility]   ??? oxyCODONE  10 mg NG tube Q6H   ??? sevelamer  800 mg NG tube [facility]   ??? sodium bicarbonate  650 mg NG tube 4x Daily       PRN medications:      Data/Imaging Review: Reviewed in Epic and personally interpreted on 06/15/2018. See EMR for detailed results.      Critical Care Attestation     This patient is critically ill or injured with the impairment of vital organ systems such that there is a high probability of imminent or life threatening deterioration in the patient's condition. This patient must remain in the ICU for ongoing evaluation of the comprehensive management plan outlined in this note. I directly provided critical care services as documented in this note and the critical care time spent ( ) is exclusive of separately billable procedures.    Daryel November, ACNP

## 2018-06-15 NOTE — Unmapped (Signed)
Continuous Renal Replacement  Dialysis Nurse Therapy Procedure Note    Treatment Type:  [facility] Number Of Days On Therapy:  - Procedure Date:  06/15/2018 4:04 PM     TREATMENT STATUS:  Restarted  Patient and Treatment Status     None          Active Dialysis Orders (168h ago, onward)     Start     Ordered    06/14/18 1529  CRRT Orders - NxStage (Adult)  Continuous     Comments:  Fluid Removal Rate parameters:  MAP   < 50 mmHg: 10 mL/hr;  MAP 51-60 mmHg: 50 mL/hr;  MAP 61-70 mmHg: 150 mL/hr;  MAP   > 70 mmHg: 200 mL/hr    Please rinse back when taking off CRRT.   Question Answer Comment   CRRT System: NxStage    Modality: CVVH    Access: Left Internal Jugular    BFR (mL/min): 200-350    Dialysate Flow Rate (mL/kg/hr): Other (Specify) 2.5 L/hr   Fluid Removal Initial Rate (mL/hr): 10    Fluid Removal Parameters: see below        06/14/18 1529              SYSTEM CHECK:  Machine Name: [id number]  Dialyzer: CAR-505   Self Test Completed: Yes.        Alarms Connected To The Wall And Active:  No.    VITAL SIGNS:  Temp:  [36.3 ??C (97.4 ??F)-36.8 ??C (98.2 ??F)] 36.6 ??C (97.9 ??F)  Core Temp:  [35.5 ??C (95.9 ??F)-36.4 ??C (97.5 ??F)] 35.5 ??C (95.9 ??F)  Heart Rate:  [82-103] 82  SpO2 Pulse:  [82-103] 82  Resp:  [18-46] 24  SpO2:  [90 %-100 %] 100 %  A BP-2: (93-177)/(54-91) 130/69  MAP:  [66 mmHg-125 mmHg] 90 mmHg    ACCESS SITE:     Hemodialysis Catheter 06/14/18 Left Internal jugular (Active)   Site Assessment Clean;Dry;Intact 06/15/2018  3:17 PM   Status Accessed 06/15/2018  3:17 PM   Dressing Intervention New dressing 06/14/2018  6:20 PM   Dressing Status      Old drainage 06/15/2018 12:00 PM   Verification by X-ray Yes 06/15/2018 12:00 PM   Site Condition No complications 06/15/2018 12:00 PM   Dressing Type Occlusive;Transparent 06/15/2018  8:00 AM   Dressing Drainage Description Sanguineous 06/15/2018 12:00 PM   Dressing Change Due 06/21/18 06/15/2018  8:00 AM   Line Necessity Reviewed? Y 06/15/2018  8:00 AM   Line Necessity Indications Yes - Hemodialysis 06/15/2018  8:00 AM   Line Necessity Reviewed With mdi 06/15/2018  8:00 AM             CATHETER FILL VOLUMES:     Arterial: 1.5 mL  Venous: 1.7 mL     Lab Results   Component Value Date    NA 137 06/15/2018    NA 142 06/15/2018    K 3.9 06/15/2018    K 4.0 06/15/2018    CL 107 06/15/2018    CO2 23.0 06/15/2018    BUN 88 (H) 06/15/2018     Lab Results   Component Value Date    CALCIUM 8.6 06/15/2018    CAION 4.89 06/15/2018    PHOS 4.6 06/15/2018    MG 1.9 06/15/2018        SETTINGS:  Blood Pump Rate:    Replacement Fluid Rate:     Pre-Blood Pump Fluid Rate:    Hourly Fluid Removal Rate: 300  mL/hr   Dialysate Fluid Rate    Therapy Fluid Temperature:       ANTICOAGULANT:  None    ADDITIONAL COMMENTS:  Lines reversed.    HEMODIALYSIS ON-CALL NURSE PAGER NUMBER:  ?? Monday thru Friday 0700 - 1730: Call the Dialysis Unit ext. [phone]   ?? After 1730 and all day Sunday: Call the Dialysis RN Pager Number [phone]     PROCEDURE REVIEW, VERIFICATION, HANDOFF:  CRRT settings verified, procedure reviewed, and instructions given to primary RN.     Primary CRRT RN Verifying: Desmond Dike, RN Dialysis RN Verifying: Theodoro Parma, RN

## 2018-06-15 NOTE — Unmapped (Signed)
All care explained to pt and her family as performed. CRRT was restarted by dialysis RN. Tube feed was re-started this pm following close assessment of residuals.

## 2018-06-15 NOTE — Unmapped (Signed)
CONTINUOUS RENAL REPLACEMENT THERAPY NOTE    Patient Katherine Norton was seen and examined on continuous renal replacement therapy June 15, 2018.    CHIEF COMPLAINT:  Acute Kidney Disease    Katherine Nortonis a 62 y.o.??female??with PMHx of liver transplant (1999) on immunosuppresion, chronic back pain, anxiety, HTN, CKD,????that presents to [facility] as a transfer from [facility] with respiratory failure and possible duodenal perforation.  INTERVAL HISTORY:   U.P continues to be poor.    PHYSICAL EXAM:  Vitals:  Temp:  [36.3 ??C (97.4 ??F)-36.6 ??C (97.9 ??F)] 36.5 ??C (97.7 ??F)  Core Temp:  [35.5 ??C (95.9 ??F)-36.9 ??C (98.4 ??F)] 35.5 ??C (95.9 ??F)  Heart Rate:  [84-111] 85  SpO2 Pulse:  [84-111] 85  MAP:  [64 mmHg-125 mmHg] 91 mmHg  A BP-2: (88-177)/(52-91) 130/71  MAP:  [64 mmHg-125 mmHg] 91 mmHg    Weights:  Admission Weight: 92.5 kg (203 lb 14.8 oz)  Last documented Weight: 99.8 kg (220 lb 0.3 oz)  Weight Change from Previous Day: No weight listed for specified days    Assessment:  General: appearing ill  Pulmonary: rales  Cardiovascular: normal  Extremities:  2+ edema to back     ACCESS:       LAB DATA:  Lab Results   Component Value Date    NA 141 06/15/2018    NA 143 06/15/2018    K 3.7 06/15/2018    K 3.7 06/15/2018    CL 107 06/15/2018    CO2 20.0 (L) 06/15/2018    BUN 100 (H) 06/15/2018    CREATININE 4.55 (H) 06/15/2018    CALCIUM 8.5 06/15/2018    PHOS 4.8 (H) 06/15/2018    ALBUMIN 2.4 (L) 06/15/2018     Lab Results   Component Value Date    HGB 7.1 (L) 06/15/2018    HCT 21.6 (L) 06/15/2018    PLT 86 (L) 06/15/2018       PLAN:  ultrafiltration rate:  200 ml/hr

## 2018-06-15 NOTE — Unmapped (Signed)
Patient intermittently follows commands. Was on pressure support for most of shift. Switched back to rate around 0200 due to being agitated and tachypneic. HR NSR, BP WDL. TF turned off around 0200 due to vomiting. NGT to low intermittent suction. Foley in place, minimal urine output. CRRT running.

## 2018-06-15 NOTE — Unmapped (Signed)
Continuous Renal Replacement  Dialysis Nurse Therapy Procedure Note    Treatment Type:  [facility] Number Of Days On Therapy:   Procedure Date:  06/14/2018 6:42 PM     TREATMENT STATUS:  Initiated  Patient and Treatment Status     None          Active Dialysis Orders (168h ago, onward)     Start     Ordered    06/14/18 1529  CRRT Orders - NxStage (Adult)  Continuous     Comments:  Fluid Removal Rate parameters:  MAP   < 50 mmHg: 10 mL/hr;  MAP 51-60 mmHg: 50 mL/hr;  MAP 61-70 mmHg: 150 mL/hr;  MAP   > 70 mmHg: 200 mL/hr    Please rinse back when taking off CRRT.   Question Answer Comment   CRRT System: NxStage    Modality: CVVH    Access: Left Internal Jugular    BFR (mL/min): 200-350    Dialysate Flow Rate (mL/kg/hr): Other (Specify) 2.5 L/hr   Fluid Removal Initial Rate (mL/hr): 10    Fluid Removal Parameters: see below        06/14/18 1529              SYSTEM CHECK:  Machine Name: [id number]  Dialyzer: CAR-505   Self Test Completed: Yes.          VITAL SIGNS:  Temp:  [37.2 ??C (99 ??F)-38.3 ??C (100.9 ??F)] 37.4 ??C (99.3 ??F)  Core Temp:  [36.4 ??C (97.5 ??F)-37.6 ??C (99.7 ??F)] 36.4 ??C (97.5 ??F)  Heart Rate:  [96-117] 96  SpO2 Pulse:  [96-116] 96  Resp:  [21-37] 21  SpO2:  [94 %-100 %] 97 %  A BP-2: (85-139)/(50-74) 103/56  MAP:  [62 mmHg-95 mmHg] 72 mmHg    ACCESS SITE:     Hemodialysis Catheter 06/14/18 Left Internal jugular (Active)   Site Assessment Bleeding 06/14/2018  6:20 PM   Status Accessed 06/14/2018  6:20 PM   Dressing Intervention New dressing 06/14/2018  6:20 PM   Dressing Status      New drainage;Old drainage 06/14/2018  6:20 PM   Verification by X-ray Yes 06/14/2018  6:20 PM   Site Condition Bleeding 06/14/2018  6:20 PM   Dressing Type Transparent;Occlusive 06/14/2018  6:20 PM   Dressing Drainage Description Sanguineous 06/14/2018  6:20 PM   Dressing Change Due 06/15/18 06/14/2018  6:20 PM   Line Necessity Reviewed? Y 06/14/2018  6:20 PM   Line Necessity Indications Yes - Other (specify in comments) 06/14/2018  6:20 PM             CATHETER FILL VOLUMES:     Arterial: 1.5 mL  Venous: 1.7 mL     Lab Results   Component Value Date    NA 141 06/14/2018    K 3.8 06/14/2018    CL 109 (H) 06/14/2018    CO2 17.0 (L) 06/14/2018    BUN 138 (H) 06/14/2018     Lab Results   Component Value Date    CALCIUM 8.5 06/14/2018    CAION 4.74 06/12/2018    PHOS 7.1 (H) 06/14/2018    MG 2.2 06/14/2018        SETTINGS:  Blood Pump Rate:    Replacement Fluid Rate:     Pre-Blood Pump Fluid Rate:    Hourly Fluid Removal Rate: 10 mL/hr   Dialysate Fluid Rate    Therapy Fluid Temperature:       ANTICOAGULANT:  None  ADDITIONAL COMMENTS:  Tx initiated with lines reversed, arterial port sluggish.    HEMODIALYSIS ON-CALL NURSE PAGER NUMBER:  ?? Monday thru Friday 0700 - 1730: Call the Dialysis Unit ext. [phone]   ?? After 1730 and all day Sunday: Call the Dialysis RN Pager Number [phone]     PROCEDURE REVIEW, VERIFICATION, HANDOFF:  CRRT settings verified, procedure reviewed, and instructions given to primary RN.     Primary CRRT RN Verifying: Museum/gallery exhibitions officer, RN Dialysis RN Verifying: Minette Headland, RN

## 2018-06-16 LAB — CBC
HEMATOCRIT: 22 % — ABNORMAL LOW (ref 36.0–46.0)
HEMATOCRIT: 22.7 % — ABNORMAL LOW (ref 36.0–46.0)
HEMATOCRIT: 22.3 % — ABNORMAL LOW (ref 36.0–46.0)
HEMOGLOBIN: 7.5 g/dL — ABNORMAL LOW (ref 12.0–16.0)
HEMOGLOBIN: 7.6 g/dL — ABNORMAL LOW (ref 12.0–16.0)
MEAN CORPUSCULAR HEMOGLOBIN CONC: 33.9 g/dL (ref 31.0–37.0)
MEAN CORPUSCULAR HEMOGLOBIN CONC: 32.9 g/dL (ref 31.0–37.0)
MEAN CORPUSCULAR HEMOGLOBIN CONC: 34.3 g/dL (ref 31.0–37.0)
MEAN CORPUSCULAR HEMOGLOBIN: 32.6 pg (ref 26.0–34.0)
MEAN CORPUSCULAR HEMOGLOBIN: 31.8 pg (ref 26.0–34.0)
MEAN CORPUSCULAR HEMOGLOBIN: 33 pg (ref 26.0–34.0)
MEAN CORPUSCULAR VOLUME: 96.1 fL (ref 80.0–100.0)
MEAN CORPUSCULAR VOLUME: 96.8 fL (ref 80.0–100.0)
MEAN CORPUSCULAR VOLUME: 96.3 fL (ref 80.0–100.0)
MEAN PLATELET VOLUME: 11.9 fL — ABNORMAL HIGH (ref 7.0–10.0)
MEAN PLATELET VOLUME: 11 fL — ABNORMAL HIGH (ref 7.0–10.0)
MEAN PLATELET VOLUME: 11.6 fL — ABNORMAL HIGH (ref 7.0–10.0)
PLATELET COUNT: 96 10*9/L — ABNORMAL LOW (ref 150–440)
PLATELET COUNT: 105 10*9/L — ABNORMAL LOW (ref 150–440)
PLATELET COUNT: 114 10*9/L — ABNORMAL LOW (ref 150–440)
RED BLOOD CELL COUNT: 2.34 10*12/L — ABNORMAL LOW (ref 4.00–5.20)
RED BLOOD CELL COUNT: 2.31 10*12/L — ABNORMAL LOW (ref 4.00–5.20)
RED CELL DISTRIBUTION WIDTH: 18 % — ABNORMAL HIGH (ref 12.0–15.0)
RED CELL DISTRIBUTION WIDTH: 18.1 % — ABNORMAL HIGH (ref 12.0–15.0)
WBC ADJUSTED: 3.9 10*9/L — ABNORMAL LOW (ref 4.5–11.0)
WBC ADJUSTED: 3.7 10*9/L — ABNORMAL LOW (ref 4.5–11.0)

## 2018-06-16 LAB — COMPREHENSIVE METABOLIC PANEL
ALBUMIN: 2.8 g/dL — ABNORMAL LOW (ref 3.5–5.0)
ALBUMIN: 2.7 g/dL — ABNORMAL LOW (ref 3.5–5.0)
ALKALINE PHOSPHATASE: 73 U/L (ref 38–126)
ALKALINE PHOSPHATASE: 81 U/L (ref 38–126)
ALKALINE PHOSPHATASE: 83 U/L (ref 38–126)
ALT (SGPT): 13 U/L — ABNORMAL LOW (ref 15–48)
ALT (SGPT): 10 U/L — ABNORMAL LOW (ref 15–48)
ALT (SGPT): 11 U/L — ABNORMAL LOW (ref 15–48)
ANION GAP: 4 mmol/L — ABNORMAL LOW (ref 9–15)
ANION GAP: 6 mmol/L — ABNORMAL LOW (ref 9–15)
AST (SGOT): 25 U/L (ref 14–38)
AST (SGOT): 27 U/L (ref 14–38)
AST (SGOT): 28 U/L (ref 14–38)
BILIRUBIN TOTAL: 1 mg/dL (ref 0.0–1.2)
BILIRUBIN TOTAL: 1.1 mg/dL (ref 0.0–1.2)
BILIRUBIN TOTAL: 1 mg/dL (ref 0.0–1.2)
BLOOD UREA NITROGEN: 52 mg/dL — ABNORMAL HIGH (ref 7–21)
BLOOD UREA NITROGEN: 43 mg/dL — ABNORMAL HIGH (ref 7–21)
BLOOD UREA NITROGEN: 31 mg/dL — ABNORMAL HIGH (ref 7–21)
BUN / CREAT RATIO: 20
BUN / CREAT RATIO: 20
BUN / CREAT RATIO: 18
CALCIUM: 9.1 mg/dL (ref 8.5–10.2)
CALCIUM: 9.2 mg/dL (ref 8.5–10.2)
CALCIUM: 8.9 mg/dL (ref 8.5–10.2)
CHLORIDE: 104 mmol/L (ref 98–107)
CHLORIDE: 104 mmol/L (ref 98–107)
CHLORIDE: 103 mmol/L (ref 98–107)
CO2: 27 mmol/L (ref 22.0–30.0)
CO2: 25 mmol/L (ref 22.0–30.0)
CO2: 26 mmol/L (ref 22.0–30.0)
CREATININE: 2.54 mg/dL — ABNORMAL HIGH (ref 0.60–1.00)
CREATININE: 2.18 mg/dL — ABNORMAL HIGH (ref 0.60–1.00)
CREATININE: 1.73 mg/dL — ABNORMAL HIGH (ref 0.60–1.00)
EGFR CKD-EPI AA FEMALE: 27 mL/min/{1.73_m2} — ABNORMAL LOW (ref >=60)
EGFR CKD-EPI NON-AA FEMALE: 19 mL/min/{1.73_m2} — ABNORMAL LOW (ref >=60)
EGFR CKD-EPI NON-AA FEMALE: 23 mL/min/{1.73_m2} — ABNORMAL LOW (ref >=60)
GLUCOSE RANDOM: 107 mg/dL (ref 65–179)
GLUCOSE RANDOM: 80 mg/dL (ref 65–179)
POTASSIUM: 4.1 mmol/L (ref 3.5–5.0)
POTASSIUM: 4.2 mmol/L (ref 3.5–5.0)
POTASSIUM: 4.3 mmol/L (ref 3.5–5.0)
PROTEIN TOTAL: 6.7 g/dL (ref 6.5–8.3)
PROTEIN TOTAL: 6.7 g/dL (ref 6.5–8.3)
SODIUM: 135 mmol/L (ref 135–145)
SODIUM: 136 mmol/L (ref 135–145)
SODIUM: 135 mmol/L (ref 135–145)

## 2018-06-16 LAB — O2 SATURATION ARTERIAL: Oxygen saturation:MFr:Pt:BldA:Qn:: 99.4

## 2018-06-16 LAB — BLOOD GAS CRITICAL CARE PANEL, ARTERIAL
BASE EXCESS ARTERIAL: 2.1 — ABNORMAL HIGH (ref -2.0–2.0)
BASE EXCESS ARTERIAL: 1.4 (ref -2.0–2.0)
BASE EXCESS ARTERIAL: 3.1 — ABNORMAL HIGH (ref -2.0–2.0)
CALCIUM IONIZED ARTERIAL (MG/DL): 4.97 mg/dL (ref 4.40–5.40)
CALCIUM IONIZED ARTERIAL (MG/DL): 4.97 mg/dL (ref 4.40–5.40)
CALCIUM IONIZED ARTERIAL (MG/DL): 4.9 mg/dL (ref 4.40–5.40)
GLUCOSE WHOLE BLOOD: 93 mg/dL
GLUCOSE WHOLE BLOOD: 104 mg/dL
HCO3 ARTERIAL: 26 mmol/L (ref 22–27)
HCO3 ARTERIAL: 25 mmol/L (ref 22–27)
HCO3 ARTERIAL: 27 mmol/L (ref 22–27)
HEMOGLOBIN BLOOD GAS: 7.4 g/dL — ABNORMAL LOW (ref 12.00–16.00)
LACTATE BLOOD ARTERIAL: 0.6 mmol/L (ref <=1.2)
LACTATE BLOOD ARTERIAL: 0.6 mmol/L (ref <=1.2)
LACTATE BLOOD ARTERIAL: 0.6 mmol/L (ref <=1.2)
O2 SATURATION ARTERIAL: 99.5 % (ref 94.0–100.0)
O2 SATURATION ARTERIAL: 99.3 % (ref 94.0–100.0)
O2 SATURATION ARTERIAL: 99.4 % (ref 94.0–100.0)
PCO2 ARTERIAL: 36.7 mmHg (ref 35.0–45.0)
PCO2 ARTERIAL: 39 mmHg (ref 35.0–45.0)
PH ARTERIAL: 7.46 — ABNORMAL HIGH (ref 7.35–7.45)
PH ARTERIAL: 7.44 (ref 7.35–7.45)
PO2 ARTERIAL: 148 mmHg — ABNORMAL HIGH (ref 80.0–110.0)
PO2 ARTERIAL: 139 mmHg — ABNORMAL HIGH (ref 80.0–110.0)
PO2 ARTERIAL: 144 mmHg — ABNORMAL HIGH (ref 80.0–110.0)
POTASSIUM WHOLE BLOOD: 4 mmol/L (ref 3.4–4.6)
POTASSIUM WHOLE BLOOD: 4 mmol/L (ref 3.4–4.6)
POTASSIUM WHOLE BLOOD: 4.2 mmol/L (ref 3.4–4.6)
SODIUM WHOLE BLOOD: 138 mmol/L (ref 135–145)
SODIUM WHOLE BLOOD: 137 mmol/L (ref 135–145)
SODIUM WHOLE BLOOD: 137 mmol/L (ref 135–145)

## 2018-06-16 LAB — PROTIME
Lab: 13.4 — ABNORMAL HIGH
Lab: 13.4 — ABNORMAL HIGH

## 2018-06-16 LAB — PCO2 ARTERIAL: Carbon dioxide:PPres:Pt:BldA:Qn:: 39

## 2018-06-16 LAB — ALKALINE PHOSPHATASE: Alkaline phosphatase:CCnc:Pt:Ser/Plas:Qn:: 73

## 2018-06-16 LAB — SODIUM WHOLE BLOOD: Sodium:SCnc:Pt:Bld:Qn:: 138

## 2018-06-16 LAB — APTT
Coagulation surface induced:Time:Pt:PPP:Qn:Coag: 31.5
Coagulation surface induced:Time:Pt:PPP:Qn:Coag: 32
Coagulation surface induced:Time:Pt:PPP:Qn:Coag: 32.2

## 2018-06-16 LAB — MAGNESIUM
Magnesium:MCnc:Pt:Ser/Plas:Qn:: 1.8
Magnesium:MCnc:Pt:Ser/Plas:Qn:: 2
Magnesium:MCnc:Pt:Ser/Plas:Qn:: 2.3 — ABNORMAL HIGH

## 2018-06-16 LAB — GLUCOSE RANDOM: Glucose:MCnc:Pt:Ser/Plas:Qn:: 107

## 2018-06-16 LAB — PHOSPHORUS
Phosphate:MCnc:Pt:Ser/Plas:Qn:: 2.7 — ABNORMAL LOW
Phosphate:MCnc:Pt:Ser/Plas:Qn:: 2.8 — ABNORMAL LOW
Phosphate:MCnc:Pt:Ser/Plas:Qn:: 3

## 2018-06-16 LAB — RED BLOOD CELL COUNT: Lab: 2.34 — ABNORMAL LOW

## 2018-06-16 LAB — MEAN PLATELET VOLUME: Lab: 11.6 — ABNORMAL HIGH

## 2018-06-16 LAB — PROTIME-INR: PROTIME: 13.4 s — ABNORMAL HIGH (ref 10.2–12.8)

## 2018-06-16 LAB — MEAN CORPUSCULAR VOLUME: Lab: 96.1

## 2018-06-16 LAB — AST (SGOT): Aspartate aminotransferase:CCnc:Pt:Ser/Plas:Qn:: 28

## 2018-06-16 LAB — INR: Lab: 1.19

## 2018-06-16 NOTE — Unmapped (Signed)
Patient remains on vent, no changes today

## 2018-06-16 NOTE — Unmapped (Signed)
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 female with  PMHx of liver transplant (1999) on immunosuppresion, chronic back pain, anxiety, HTN, CKD,  that presents to [facility] as a transfer from [facility], with respiratory failure and possible contained duodenal perforation.      24hr events: No further fevers. Did have large volume emesis overnight. Tolerating UF of -200    Dispo: ICU    Neurological   #acute on chronic pain, sedation  #acute encephalopathy:  - presented to OSH altered, but also hypoxic at that time.  - improved prior to extubation and immed following re-intubation (FSC nodding y/n)  - mentation limited by sedation for vent compliance and comfort  - analegsia prn  - propofol dc'd 8/20  - inc'd oxy to 10 mg scheduled every 6 hrs 8/19  - pt alert 8/22 and in agreement w/ dialysis and trach if needed    Pulmonary   #acute hypoxic respiratory failure likely due to multifocal PNA, ARDS.  Intubated on 8/1 at OSH CT chest with Scattered airspace opacities in the left apex, left upper lobe and superior segment of left lower lobe; concerning for pneumonia.  Extubated 8/12 and reint 8/13 for progressive hypoxia and inc WOB   - tolerating PRVC well  - cont wean vent as tol  - continues to fail SBT d/t inc RR and low Vt (100s)    - Optimal PEEP 14 per esophogeal balloon eval (8/14)   - pulm toilet as tol, HOB>30deg  - peridex for vap ppx       Cardiovascular   #acute HFrEF  #h/o hypertension   - hold home metoprolol and amlodipine   - prn hydralazine  - Echo 7/29: EF 65-70%, mod pulm HTN and borderline dilated RV   - repeat echo 8/16 EF >55%, severe RV fx and dilated RA, severe phtn, mod TR    #Hypotension:  - developed pressor requirement 8/15  - Continue norepi gtt for MAP>65, currently off 8/22    Renal   #Acute on chronic renal failure  - Cr baseline appears to be ~1.7--> 3-->2.87  - nephrology consulted 8/16, appreciate recs >>   - CRRT started 8/22    Infectious Disease/Autoimmune   #septic shock due to ? PNA.  Initially seen at OSH and had c/f perforated duodenal ulcer, but CT scan here non concerning.  CT chest with possible multifocal PNA.    - appreciate ID's assistance. ID signed off 8/17  - WOCN consulted for intertrigo and sacral wound (see recs 8/19)  - Blood/urine/sputum cxs sent overnight 8/20, abx given x 1 dose    Abx:   Cefepime (8/22- 8/22)  Fluconazole (8/22- 8/22)  Vanco (8/11-->8/12)  Mica (8/11-->8/12)  Flagyl (8/10-->8/14)  Cefepime  (8/10-->8/16)  Voriconazole (8/12-->8/15)  Linezolid (8/15-8/16)    Abx: OSH  ceftaroline and flagyl  levaquin  meropenem     Cx OSH:  -  7/29 Strep pneumo Ag neg, RVP neg, urine Legionella Ag neg  - 7/29 bd cx NGTD  OSH: Fungal, pneumocystis/histoplasma, AFB, Gram on BAL all were negative.    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  Per surgery, NO perf.      #abd pain:   - pt endorsed abd pain, prior to reintubation >> cont to have tenderness on palpation  - lipase elevated on 8/11, f/u in am       Malnutrition Assessment: Not done yet.  Patient does not meet AND/ASPEN criteria for malnutrition at this time (06/13/18 1522)- Tol Tf well at goal  - FMS for liquid stools, output unchanged   - consider cdiff if spikes temp or new leukocytosis    Heme/Coag   Acute blood loss- no s/s of bleeding  - Hgb drop to 5.8 at OSH with transfusion  - cbc stable.    - DVT ppx held due to drop in h/h.  Restarted.    - Required 2 units PRBC 8/20-8/21, no s/s of bleeding    Endocrine   nai    Prophylaxis/LDA/Restraints/Consults   Can CVC be removed? No: need for medications requiring central access (e.g. pressors)   Can A-line be removed? No: frequent ABGs  Can Foley be removed? No: Need continuous I/O  Mobility plan: Step 1 - Range of motion    Feeding: Tube feeds at goal  Analgesia: Pain adequately controlled  Sedation SAT/SBT: Yes  Thrombembolic ppx: SQ heparin  Head of bed: >30 deg  Ulcer ppx: Yes, mechanical ventilation > 3 days, not on tube feeds  Glucose within target range: Yes, in range    RASS at goal? Yes  [place] Agitation Assessment Scale (RASS) : 0 (06/16/2018 12:00 PM)     Can antipsychotics be stopped? N/A, not on antipsychotics  CAM-ICU Result: Positive (06/15/2018  8:00 PM)      Patient Lines/Drains/Airways Status    Active Active Lines, Drains, & Airways     Name:   Placement date:   Placement time:   Site:   Days:    ETT  7.5   06/05/18    0037     11    CVC Triple Lumen 06/03/18 Non-tunneled Right Internal jugular   06/03/18    1503    Internal jugular   12    NG/OG Tube Right nostril   06/02/18    2121    Right nostril   13    Urethral Catheter   06/02/18    2118    ???   13    Arterial Line 06/03/18 Right Radial   06/03/18    0007    Radial   13    Hemodialysis Catheter 06/14/18 Left Internal jugular   06/14/18    1400    Internal jugular   1              Patient Lines/Drains/Airways Status    Active Wounds     Name:   Placement date:   Placement time:   Site:   Days:    Wound 06/08/18 Pressure Injury Buttocks Posterior pink Stage 2   06/08/18    0001  Buttocks   8    Wound 06/09/18 Rash/Dermititis Perineum   06/09/18    2154    Perineum   6                Goals of Care     Code Status: Full Code Designated Healthcare Decision Maker:  Ms. Boster currently lacks decisional capacity for healthcare decision-making and is unable to designate a surrogate healthcare decision maker. Ms. Ardito designated healthcare decision maker(s) is/are kazi montoro and [name] (the patient's adult child) as denoted by hospital policy for patients without a known preference.      Subjective   Katherine Norton is a 62 y.o. female with PMHx of liver transplant (1999) on immunosuppresion, chronic back pain, anxiety, HTN, CKD,  that presents to [facility] as a transfer from [facility]. Patient initially presented to the OSH on 05/20/18 due to acute worsening shortness of breath and altered mental status. Patient was sating 68% on RA, but 90% on CPAP. Patient had severe acidemia on ABG as well as acute on chronic renal failure with Cr of 3.89. CXR at that time showed consolidation with concern for pneumonia and was febrile. Patient's initial lactate was 2.70. Patient was started on IV ceftaroline and flagyl initially.      Objective     Vitals - past 24 hours  Temp:  [36.6 ??C (97.9 ??F)-36.8 ??C (98.2 ??F)] 36.8 ??C (98.2 ??F)  Heart Rate:  [77-107] 77  SpO2 Pulse:  [77-100] 77  Resp:  [19-34] 29  FiO2 (%):  [30 %] 30 %  SpO2:  [95 %-100 %] 100 % Intake/Output  I/O last 3 completed shifts:  In: 1965 [I.V.:120; Blood:300; NG/GT:1545]  Out: 4982 [Urine:180; Emesis/NG output:50; ZOXWR:6045; Stool:400]     Physical Exam:    General: obese female, A&O person, place, year, nods y/n   HEENT: PERRLA  CV: RRR, no mrg  Pulm: coarse bs bilaterally  GI: soft, distended, non-tender  MSK: no edema  Skin: intact  Neuro: awake, FC, mae, gen weakness, antigrav BUE, BLE 2/5      Continuous Infusions:   ??? NxStage RFP 400 5000 mL - contains 2 mEq/L of potassium     ??? NxStage RFP 401 5000 mL - contains 4 mEq/L of potassium     ??? NxStage RFP 401 BB 5000 mL - contains 4 mEq/L of potassium         Scheduled Medications:   ??? calcitonin (salmon)  1 spray Alternating Nares Daily (RT)   ??? chlorhexidine  5 mL Mouth BID   ??? cycloSPORINE  50 mg NG tube BID   ??? esomeprazole  40 mg NG tube daily   ??? heparin (porcine) for subcutaneous use  5,000 Units Subcutaneous [facility]   ??? oxyCODONE  10 mg NG tube Q6H       PRN medications:      Data/Imaging Review: Reviewed in Epic and personally interpreted on 06/16/2018. See EMR for detailed results.      Critical Care Attestation     This patient is critically ill or injured with the impairment of vital organ systems such that there is a high probability of imminent or life threatening deterioration in the patient's condition. This patient must remain in the ICU for ongoing evaluation of the comprehensive management plan outlined in this note. I directly provided critical care services as documented in this note and the critical care time spent (30 min) is exclusive of separately billable procedures.  Lucely Leard Fonnie Mu, ACNP

## 2018-06-16 NOTE — Unmapped (Signed)
CONTINUOUS RENAL REPLACEMENT THERAPY INTRA-PROCEDURE NOTE    June 16, 2018     Patient Katherine Norton was seen and examined on CRRT.    CHIEF COMPLAINT:  End Stage Renal Disease    INTERVAL HISTORY:   No acute events.     PHYSICAL EXAM:  Vitals:  Temp:  [36.6 ??C (97.9 ??F)-36.8 ??C (98.2 ??F)] 36.8 ??C (98.2 ??F)  Heart Rate:  [77-107] 77  SpO2 Pulse:  [77-100] 77  MAP:  [66 mmHg-107 mmHg] 101 mmHg  A BP-2: (93-157)/(54-79) 146/75  MAP:  [66 mmHg-107 mmHg] 101 mmHg    Weights:  Admission Weight: 92.5 kg (203 lb 14.8 oz)  Last documented Weight: 99.8 kg (220 lb 0.3 oz)  Weight Change from Previous Day: No weight listed for specified days    Assessment:  General: Appearing awake, intubated  Pulmonary: coarse ventilated BS, some rhonchi  Cardiovascular: regular rate and rhythm  Extremities:  1+ edema      ACCESS:       LAB DATA:  Lab Results   Component Value Date    NA 135 06/16/2018    NA 138 06/16/2018    NA 142 06/06/2018    K 4.1 06/16/2018    K 4.0 06/16/2018    K 3.7 06/06/2018    CL 104 06/16/2018    CL 104 04/05/2018    BUN 52 (H) 06/16/2018    BUN 30 (H) 04/05/2018    CREATININE 2.54 (H) 06/16/2018    CREATININE 2.32 (H) 04/05/2018     Lab Results   Component Value Date    HGB 7.5 (L) 06/16/2018    HGB 7.2 (L) 06/06/2018    HCT 22.0 (L) 06/16/2018    HCT 30.8 (L) 04/05/2018    WBC 3.9 (L) 06/16/2018    WBC 3.3 (L) 04/05/2018     Lab Results   Component Value Date    PHOS 3.0 06/16/2018    PHOS 4.5 12/04/2014       PLAN:  Ultrafiltration Goal:   200 mL/min    Rachel Moulds, M.D.  [phone]  June 16, 2018 8:42 AM

## 2018-06-16 NOTE — Unmapped (Signed)
Pt remained clinically stable overnight. Able to follow commands but was intermittently agitated to RASS +2 requiring PRN medication and 50 mcg Fentanyl IV x1 with relief of agitation. Remains NSR with MAP >65. No vasopressors required overnight. CRRT running-no issues. Pt is not restrained but has mitts currently, attempted to reach for her tube during mitt change overnight. All VSS. No other issues/concerns. Family updated prior to shift last night.       Problem: Adult Inpatient Plan of Care  Goal: Plan of Care Review  Outcome: Progressing  Goal: Patient-Specific Goal (Individualization)  Outcome: Progressing  Goal: Absence of Hospital-Acquired Illness or Injury  Outcome: Progressing  Goal: Optimal Comfort and Wellbeing  Outcome: Progressing  Goal: Readiness for Transition of Care  Outcome: Progressing  Goal: Rounds/Family Conference  Outcome: Progressing     Problem: Latex Allergy  Goal: Absence of Allergy Symptoms  Outcome: Progressing     Problem: Fall Injury Risk  Goal: Absence of Fall and Fall-Related Injury  Outcome: Progressing     Problem: Communication Impairment (Mechanical Ventilation, Invasive)  Goal: Effective Communication  Outcome: Progressing     Problem: Device-Related Complication Risk (Mechanical Ventilation, Invasive)  Goal: Optimal Device Function  Outcome: Progressing     Problem: Inability to Wean (Mechanical Ventilation, Invasive)  Goal: Mechanical Ventilation Liberation  Outcome: Progressing     Problem: Nutrition Impairment (Mechanical Ventilation, Invasive)  Goal: Optimal Nutrition Delivery  Outcome: Progressing     Problem: Skin and Tissue Injury (Mechanical Ventilation, Invasive)  Goal: Absence of Device-Related Skin and Tissue Injury  Outcome: Progressing     Problem: Ventilator-Induced Lung Injury (Mechanical Ventilation, Invasive)  Goal: Absence of Ventilator-Induced Lung Injury  Outcome: Progressing     Problem: Self-Care Deficit  Goal: Improved Ability to Complete Activities of Daily Living  Outcome: Progressing     Problem: Hypertension Comorbidity  Goal: Blood Pressure in Desired Range  Outcome: Progressing     Problem: Pain Chronic (Persistent) (Comorbidity Management)  Goal: Acceptable Pain Control and Functional Ability  Outcome: Progressing     Problem: Fluid Imbalance (Pneumonia)  Goal: Fluid Balance  Outcome: Progressing     Problem: Infection (Pneumonia)  Goal: Resolution of Infection Signs/Symptoms  Outcome: Progressing     Problem: Respiratory Compromise (Pneumonia)  Goal: Effective Oxygenation and Ventilation  Outcome: Progressing

## 2018-06-16 NOTE — Unmapped (Signed)
All care explained to pt and her family as performed. Will continue to monitor VS, fluid status, respiratory status, and comfort.

## 2018-06-17 DIAGNOSIS — J9601 Acute respiratory failure with hypoxia: Principal | ICD-10-CM

## 2018-06-17 LAB — COMPREHENSIVE METABOLIC PANEL
ALBUMIN: 2.7 g/dL — ABNORMAL LOW (ref 3.5–5.0)
ALBUMIN: 2.7 g/dL — ABNORMAL LOW (ref 3.5–5.0)
ALKALINE PHOSPHATASE: 82 U/L (ref 38–126)
ALKALINE PHOSPHATASE: 76 U/L (ref 38–126)
ALKALINE PHOSPHATASE: 76 U/L (ref 38–126)
ALT (SGPT): 9 U/L — ABNORMAL LOW (ref 15–48)
ALT (SGPT): 13 U/L — ABNORMAL LOW (ref 15–48)
ALT (SGPT): <8 U/L — ABNORMAL LOW (ref 15–48)
ANION GAP: 6 mmol/L — ABNORMAL LOW (ref 9–15)
ANION GAP: 6 mmol/L — ABNORMAL LOW (ref 9–15)
ANION GAP: 4 mmol/L — ABNORMAL LOW (ref 9–15)
AST (SGOT): 29 U/L (ref 14–38)
AST (SGOT): 30 U/L (ref 14–38)
AST (SGOT): 31 U/L (ref 14–38)
BILIRUBIN TOTAL: 1 mg/dL (ref 0.0–1.2)
BILIRUBIN TOTAL: 1.1 mg/dL (ref 0.0–1.2)
BLOOD UREA NITROGEN: 22 mg/dL — ABNORMAL HIGH (ref 7–21)
BLOOD UREA NITROGEN: 19 mg/dL (ref 7–21)
BLOOD UREA NITROGEN: 16 mg/dL (ref 7–21)
BUN / CREAT RATIO: 15
BUN / CREAT RATIO: 14
BUN / CREAT RATIO: 13
CALCIUM: 9 mg/dL (ref 8.5–10.2)
CALCIUM: 9 mg/dL (ref 8.5–10.2)
CALCIUM: 9 mg/dL (ref 8.5–10.2)
CHLORIDE: 104 mmol/L (ref 98–107)
CHLORIDE: 104 mmol/L (ref 98–107)
CHLORIDE: 104 mmol/L (ref 98–107)
CO2: 26 mmol/L (ref 22.0–30.0)
CO2: 26 mmol/L (ref 22.0–30.0)
CO2: 27 mmol/L (ref 22.0–30.0)
CREATININE: 1.45 mg/dL — ABNORMAL HIGH (ref 0.60–1.00)
CREATININE: 1.28 mg/dL — ABNORMAL HIGH (ref 0.60–1.00)
EGFR CKD-EPI AA FEMALE: 44 mL/min/{1.73_m2} — ABNORMAL LOW (ref >=60)
EGFR CKD-EPI AA FEMALE: 48 mL/min/{1.73_m2} — ABNORMAL LOW (ref >=60)
EGFR CKD-EPI AA FEMALE: 51 mL/min/{1.73_m2} — ABNORMAL LOW (ref >=60)
EGFR CKD-EPI NON-AA FEMALE: 38 mL/min/{1.73_m2} — ABNORMAL LOW (ref >=60)
EGFR CKD-EPI NON-AA FEMALE: 41 mL/min/{1.73_m2} — ABNORMAL LOW (ref >=60)
EGFR CKD-EPI NON-AA FEMALE: 45 mL/min/{1.73_m2} — ABNORMAL LOW (ref >=60)
GLUCOSE RANDOM: 65 mg/dL (ref 65–179)
GLUCOSE RANDOM: 67 mg/dL (ref 65–179)
POTASSIUM: 4.4 mmol/L (ref 3.5–5.0)
POTASSIUM: 4.3 mmol/L (ref 3.5–5.0)
PROTEIN TOTAL: 6.9 g/dL (ref 6.5–8.3)
PROTEIN TOTAL: 6.8 g/dL (ref 6.5–8.3)
PROTEIN TOTAL: 6.8 g/dL (ref 6.5–8.3)
SODIUM: 136 mmol/L (ref 135–145)
SODIUM: 136 mmol/L (ref 135–145)
SODIUM: 135 mmol/L (ref 135–145)

## 2018-06-17 LAB — BLOOD GAS CRITICAL CARE PANEL, ARTERIAL
BASE EXCESS ARTERIAL: 2.6 — ABNORMAL HIGH (ref -2.0–2.0)
CALCIUM IONIZED ARTERIAL (MG/DL): 5 mg/dL (ref 4.40–5.40)
CALCIUM IONIZED ARTERIAL (MG/DL): 5.09 mg/dL (ref 4.40–5.40)
CALCIUM IONIZED ARTERIAL (MG/DL): 4.77 mg/dL (ref 4.40–5.40)
GLUCOSE WHOLE BLOOD: 65 mg/dL
GLUCOSE WHOLE BLOOD: 65 mg/dL
GLUCOSE WHOLE BLOOD: 65 mg/dL
HCO3 ARTERIAL: 26 mmol/L (ref 22–27)
HCO3 ARTERIAL: 27 mmol/L (ref 22–27)
HCO3 ARTERIAL: 27 mmol/L (ref 22–27)
HEMOGLOBIN BLOOD GAS: 7 g/dL — ABNORMAL LOW (ref 12.00–16.00)
HEMOGLOBIN BLOOD GAS: 9.7 g/dL — ABNORMAL LOW (ref 12.00–16.00)
LACTATE BLOOD ARTERIAL: 0.6 mmol/L (ref <=1.2)
LACTATE BLOOD ARTERIAL: 0.4 mmol/L (ref <=1.2)
LACTATE BLOOD ARTERIAL: 0.6 mmol/L (ref <=1.2)
O2 SATURATION ARTERIAL: 99.8 % (ref 94.0–100.0)
O2 SATURATION ARTERIAL: 99.2 % (ref 94.0–100.0)
O2 SATURATION ARTERIAL: 99.1 % (ref 94.0–100.0)
PCO2 ARTERIAL: 37.2 mmHg (ref 35.0–45.0)
PCO2 ARTERIAL: 42.8 mmHg (ref 35.0–45.0)
PCO2 ARTERIAL: 45.2 mmHg — ABNORMAL HIGH (ref 35.0–45.0)
PH ARTERIAL: 7.46 — ABNORMAL HIGH (ref 7.35–7.45)
PH ARTERIAL: 7.41 (ref 7.35–7.45)
PH ARTERIAL: 7.39 (ref 7.35–7.45)
PO2 ARTERIAL: 131 mmHg — ABNORMAL HIGH (ref 80.0–110.0)
POTASSIUM WHOLE BLOOD: 4.3 mmol/L (ref 3.4–4.6)
POTASSIUM WHOLE BLOOD: 4.2 mmol/L (ref 3.4–4.6)
POTASSIUM WHOLE BLOOD: 4.2 mmol/L (ref 3.4–4.6)
SODIUM WHOLE BLOOD: 138 mmol/L (ref 135–145)
SODIUM WHOLE BLOOD: 134 mmol/L — ABNORMAL LOW (ref 135–145)

## 2018-06-17 LAB — PHOSPHORUS
Phosphate:MCnc:Pt:Ser/Plas:Qn:: 2.4 — ABNORMAL LOW
Phosphate:MCnc:Pt:Ser/Plas:Qn:: 2.4 — ABNORMAL LOW
Phosphate:MCnc:Pt:Ser/Plas:Qn:: 2.8 — ABNORMAL LOW

## 2018-06-17 LAB — CBC
HEMATOCRIT: 21.2 % — ABNORMAL LOW (ref 36.0–46.0)
HEMATOCRIT: 21.5 % — ABNORMAL LOW (ref 36.0–46.0)
HEMATOCRIT: 21.6 % — ABNORMAL LOW (ref 36.0–46.0)
HEMOGLOBIN: 6.9 g/dL — ABNORMAL LOW (ref 12.0–16.0)
HEMOGLOBIN: 7 g/dL — ABNORMAL LOW (ref 12.0–16.0)
MEAN CORPUSCULAR HEMOGLOBIN CONC: 33.2 g/dL (ref 31.0–37.0)
MEAN CORPUSCULAR HEMOGLOBIN CONC: 32.1 g/dL (ref 31.0–37.0)
MEAN CORPUSCULAR HEMOGLOBIN CONC: 32.4 g/dL (ref 31.0–37.0)
MEAN CORPUSCULAR HEMOGLOBIN: 32.1 pg (ref 26.0–34.0)
MEAN CORPUSCULAR HEMOGLOBIN: 31.4 pg (ref 26.0–34.0)
MEAN CORPUSCULAR HEMOGLOBIN: 31.6 pg (ref 26.0–34.0)
MEAN CORPUSCULAR VOLUME: 96.9 fL (ref 80.0–100.0)
MEAN CORPUSCULAR VOLUME: 97.8 fL (ref 80.0–100.0)
MEAN CORPUSCULAR VOLUME: 97.6 fL (ref 80.0–100.0)
MEAN PLATELET VOLUME: 11.2 fL — ABNORMAL HIGH (ref 7.0–10.0)
MEAN PLATELET VOLUME: 11.1 fL — ABNORMAL HIGH (ref 7.0–10.0)
PLATELET COUNT: 97 10*9/L — ABNORMAL LOW (ref 150–440)
PLATELET COUNT: 104 10*9/L — ABNORMAL LOW (ref 150–440)
PLATELET COUNT: 122 10*9/L — ABNORMAL LOW (ref 150–440)
RED BLOOD CELL COUNT: 2.19 10*12/L — ABNORMAL LOW (ref 4.00–5.20)
RED BLOOD CELL COUNT: 2.2 10*12/L — ABNORMAL LOW (ref 4.00–5.20)
RED CELL DISTRIBUTION WIDTH: 18.1 % — ABNORMAL HIGH (ref 12.0–15.0)
RED CELL DISTRIBUTION WIDTH: 17.7 % — ABNORMAL HIGH (ref 12.0–15.0)
WBC ADJUSTED: 3.7 10*9/L — ABNORMAL LOW (ref 4.5–11.0)
WBC ADJUSTED: 3.8 10*9/L — ABNORMAL LOW (ref 4.5–11.0)

## 2018-06-17 LAB — BLOOD GAS, ARTERIAL
BASE EXCESS ARTERIAL: 2.2 — ABNORMAL HIGH (ref -2.0–2.0)
BASE EXCESS ARTERIAL: 1.7 (ref -2.0–2.0)
HCO3 ARTERIAL: 27 mmol/L (ref 22–27)
HCO3 ARTERIAL: 27 mmol/L (ref 22–27)
O2 SATURATION ARTERIAL: 99 % (ref 94.0–100.0)
O2 SATURATION ARTERIAL: 95.6 % (ref 94.0–100.0)
PCO2 ARTERIAL: 43.7 mmHg (ref 35.0–45.0)
PCO2 ARTERIAL: 47.3 mmHg — ABNORMAL HIGH (ref 35.0–45.0)
PO2 ARTERIAL: 130 mmHg — ABNORMAL HIGH (ref 80.0–110.0)
PO2 ARTERIAL: 78.2 mmHg — ABNORMAL LOW (ref 80.0–110.0)

## 2018-06-17 LAB — PROTIME
Lab: 13.6 — ABNORMAL HIGH
Lab: 14 — ABNORMAL HIGH
Lab: 14.2 — ABNORMAL HIGH

## 2018-06-17 LAB — PCO2 ARTERIAL: Carbon dioxide:PPres:Pt:BldA:Qn:: 43.7

## 2018-06-17 LAB — O2 SATURATION ARTERIAL: Oxygen saturation:MFr:Pt:BldA:Qn:: 99.1

## 2018-06-17 LAB — PO2 ARTERIAL: Oxygen:PPres:Pt:BldA:Qn:: 78.2 — ABNORMAL LOW

## 2018-06-17 LAB — HEMATOCRIT
Lab: 21.5 — ABNORMAL LOW
Lab: 21.6 — ABNORMAL LOW

## 2018-06-17 LAB — HEPARIN CORRELATION
Lab: 0.2
Lab: 0.2

## 2018-06-17 LAB — LIPASE: Triacylglycerol lipase:CCnc:Pt:Ser/Plas:Qn:: 120

## 2018-06-17 LAB — APTT
APTT: 34.2 s (ref 25.9–39.5)
APTT: 37.9 s (ref 25.9–39.5)
Coagulation surface induced:Time:Pt:PPP:Qn:Coag: 35.5
HEPARIN CORRELATION: 0.2

## 2018-06-17 LAB — WBC ADJUSTED: Lab: 4 — ABNORMAL LOW

## 2018-06-17 LAB — CALCIUM IONIZED ARTERIAL (MG/DL): Calcium.ionized:MCnc:Pt:Bld:Qn:: 5

## 2018-06-17 LAB — MAGNESIUM
Magnesium:MCnc:Pt:Ser/Plas:Qn:: 1.9
Magnesium:MCnc:Pt:Ser/Plas:Qn:: 2
Magnesium:MCnc:Pt:Ser/Plas:Qn:: 2

## 2018-06-17 LAB — PROTEIN TOTAL: Protein:MCnc:Pt:Ser/Plas:Qn:: 6.8

## 2018-06-17 LAB — ALT (SGPT): Alanine aminotransferase:CCnc:Pt:Ser/Plas:Qn:: 9 — ABNORMAL LOW

## 2018-06-17 LAB — AST (SGOT): Aspartate aminotransferase:CCnc:Pt:Ser/Plas:Qn:: 30

## 2018-06-17 LAB — POTASSIUM WHOLE BLOOD: Potassium:SCnc:Pt:Bld:Qn:: 4.2

## 2018-06-17 NOTE — Unmapped (Signed)
Pt remained stable overnight. All VSS overnight. Tube feeds held. Emesis x1 around 0100, approximately 300 mL of bile. Zofran 4 mg IV PRN given and one time 2 mg Morphine given. NGT to low-intermittent per verbal from Med I team d/t KUB (possible ileus) results. CRRT continuously running without problems- goal UF reached overnight after machine change. Foley catheter exchanged due to pink, hazy, purulent urinary output. Med I team aware. No other issues/concerns. Will continue to monitor.     Problem: Adult Inpatient Plan of Care  Goal: Plan of Care Review  Outcome: Progressing  Goal: Patient-Specific Goal (Individualization)  Outcome: Progressing  Goal: Absence of Hospital-Acquired Illness or Injury  Outcome: Progressing  Goal: Optimal Comfort and Wellbeing  Outcome: Progressing  Goal: Readiness for Transition of Care  Outcome: Progressing  Goal: Rounds/Family Conference  Outcome: Progressing     Problem: Latex Allergy  Goal: Absence of Allergy Symptoms  Outcome: Progressing     Problem: Skin Injury Risk Increased  Goal: Skin Health and Integrity  Outcome: Progressing     Problem: Fall Injury Risk  Goal: Absence of Fall and Fall-Related Injury  Outcome: Progressing     Problem: Communication Impairment (Mechanical Ventilation, Invasive)  Goal: Effective Communication  Outcome: Progressing     Problem: Device-Related Complication Risk (Mechanical Ventilation, Invasive)  Goal: Optimal Device Function  Outcome: Progressing     Problem: Inability to Wean (Mechanical Ventilation, Invasive)  Goal: Mechanical Ventilation Liberation  Outcome: Progressing     Problem: Nutrition Impairment (Mechanical Ventilation, Invasive)  Goal: Optimal Nutrition Delivery  Outcome: Progressing     Problem: Skin and Tissue Injury (Mechanical Ventilation, Invasive)  Goal: Absence of Device-Related Skin and Tissue Injury  Outcome: Progressing     Problem: Ventilator-Induced Lung Injury (Mechanical Ventilation, Invasive)  Goal: Absence of Ventilator-Induced Lung Injury  Outcome: Progressing     Problem: Self-Care Deficit  Goal: Improved Ability to Complete Activities of Daily Living  Outcome: Progressing     Problem: Hypertension Comorbidity  Goal: Blood Pressure in Desired Range  Outcome: Progressing     Problem: Pain Chronic (Persistent) (Comorbidity Management)  Goal: Acceptable Pain Control and Functional Ability  Outcome: Progressing     Problem: Fluid Imbalance (Pneumonia)  Goal: Fluid Balance  Outcome: Progressing     Problem: Infection (Pneumonia)  Goal: Resolution of Infection Signs/Symptoms  Outcome: Progressing     Problem: Respiratory Compromise (Pneumonia)  Goal: Effective Oxygenation and Ventilation  Outcome: Progressing

## 2018-06-17 NOTE — Unmapped (Signed)
Pt lavaged and suctioned repeatedly to remove mucus plug.  Two plugs removed and pt resting on current vent settings.  No concerns at this time.

## 2018-06-17 NOTE — Unmapped (Signed)
Called to pt room, pt needed suctioning again, mucus plug (smaller) removed, pt vomited upon suctioning orally.  Nurse is in room cleaning pt.  Will continue to monitor.

## 2018-06-17 NOTE — Unmapped (Signed)
CONTINUOUS RENAL REPLACEMENT THERAPY NOTE    Patient Katherine Norton was seen and examined on continuous renal replacement therapy June 17, 2018.    CHIEF COMPLAINT:  Acute Kidney Disease    INTERVAL HISTORY:   B.P improved. Can stop CRRT once cartridge expires.    PHYSICAL EXAM:  Vitals:  Temp:  [36.7 ??C (98 ??F)-36.7 ??C (98.1 ??F)] 36.7 ??C (98.1 ??F)  Core Temp:  [36.9 ??C (98.4 ??F)-37.2 ??C (99 ??F)] 36.9 ??C (98.4 ??F)  Heart Rate:  [79-117] 92  SpO2 Pulse:  [79-116] 92  MAP:  [86 mmHg-120 mmHg] 102 mmHg  A BP-2: (129-170)/(63-88) 150/74  MAP:  [86 mmHg-120 mmHg] 102 mmHg    Weights:  Admission Weight: 92.5 kg (203 lb 14.8 oz)  Last documented Weight: 99.8 kg (220 lb 0.3 oz)  Weight Change from Previous Day: No weight listed for specified days    Assessment:  General: appearing fatigued  Pulmonary: normal  Cardiovascular: normal  Extremities:  No edema     ACCESS:       LAB DATA:  Lab Results   Component Value Date    NA 136 06/17/2018    NA 137 06/17/2018    K 4.3 06/17/2018    K 4.2 06/17/2018    CL 104 06/17/2018    CO2 26.0 06/17/2018    BUN 19 06/17/2018    CREATININE 1.36 (H) 06/17/2018    CALCIUM 9.0 06/17/2018    PHOS 2.4 (L) 06/17/2018    ALBUMIN 2.7 (L) 06/17/2018     Lab Results   Component Value Date    HGB 6.9 (L) 06/17/2018    HCT 21.5 (L) 06/17/2018    PLT 104 (L) 06/17/2018       PLAN:  ultrafiltration rate:  200 ml/hr

## 2018-06-17 NOTE — Unmapped (Signed)
Continuous Renal Replacement  Dialysis Nurse Therapy Procedure Note    Treatment Type:  [facility] Number Of Days On Therapy:  0 Procedure Date:  06/16/2018 7:30 PM     TREATMENT STATUS:  Restarted  Patient and Treatment Status     None          Active Dialysis Orders (168h ago, onward)     Start     Ordered    06/14/18 1529  CRRT Orders - NxStage (Adult)  Continuous     Comments:  Fluid Removal Rate parameters:  MAP   < 50 mmHg: 10 mL/hr;  MAP 51-60 mmHg: 50 mL/hr;  MAP 61-70 mmHg: 150 mL/hr;  MAP   > 70 mmHg: 200 mL/hr    Please rinse back when taking off CRRT.   Question Answer Comment   CRRT System: NxStage    Modality: CVVH    Access: Left Internal Jugular    BFR (mL/min): 200-350    Dialysate Flow Rate (mL/kg/hr): Other (Specify) 2.5 L/hr   Fluid Removal Initial Rate (mL/hr): 10    Fluid Removal Parameters: see below        06/14/18 1529              SYSTEM CHECK:  Machine Name: [id number]  Dialyzer: [id number]   Self Test Completed: Yes.        Alarms Connected To The Wall And Active:  Yes.    VITAL SIGNS:  Temp:  [36.7 ??C (98 ??F)-36.8 ??C (98.2 ??F)] 36.7 ??C (98.1 ??F)  Heart Rate:  [76-107] 81  SpO2 Pulse:  [76-96] 81  Resp:  [19-34] 24  SpO2:  [95 %-100 %] 100 %  A BP-2: (124-158)/(68-81) 150/74  MAP:  [89 mmHg-109 mmHg] 101 mmHg    ACCESS SITE:     Hemodialysis Catheter 06/14/18 Left Internal jugular (Active)   Site Assessment Clean;Dry;Intact 06/16/2018  4:00 PM   Status Patent 06/16/2018  4:00 PM   Dressing Intervention New dressing 06/14/2018  6:20 PM   Dressing Status      Clean;Dry;Intact/not removed 06/16/2018  4:00 PM   Verification by X-ray Yes 06/16/2018  4:00 PM   Site Condition No complications 06/16/2018  4:00 PM   Dressing Type Transparent;Occlusive;Antimicrobial dressing 06/16/2018  8:00 AM   Dressing Drainage Description Sanguineous 06/15/2018  4:00 PM   Dressing Change Due 06/21/18 06/16/2018  8:00 AM   Line Necessity Reviewed? Y 06/16/2018  8:00 AM   Line Necessity Indications Yes - Hemodialysis 06/16/2018 8:00 AM   Line Necessity Reviewed With nephro 06/16/2018  8:00 AM       Lab Results   Component Value Date    NA 136 06/16/2018    NA 137 06/16/2018    K 4.2 06/16/2018    K 4.0 06/16/2018    CL 104 06/16/2018    CO2 25.0 06/16/2018    BUN 43 (H) 06/16/2018     Lab Results   Component Value Date    CALCIUM 9.2 06/16/2018    CAION 4.97 06/16/2018    PHOS 2.8 (L) 06/16/2018    MG 2.3 (H) 06/16/2018        SETTINGS:  Blood Pump Rate: 300 mL/min  Replacement Fluid Rate:     Pre-Blood Pump Fluid Rate:    Hourly Fluid Removal Rate: 2.5 mL/hr   Dialysate Fluid Rate    Therapy Fluid Temperature:       ANTICOAGULANT:  None    ADDITIONAL COMMENTS:  None    HEMODIALYSIS ON-CALL  NURSE PAGER NUMBER:  ?? Monday thru Friday 0700 - 1730: Call the Dialysis Unit ext. [phone]   ?? After 1730 and all day Sunday: Call the Dialysis RN Pager Number [phone]     PROCEDURE REVIEW, VERIFICATION, HANDOFF:  CRRT settings verified, procedure reviewed, and instructions given to primary RN.     Primary CRRT RN Verifying: Desmond Dike, RN Dialysis RN Verifying: Mercie Eon, RN

## 2018-06-17 NOTE — Unmapped (Signed)
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 female with  PMHx of liver transplant (1999) on immunosuppresion, chronic back pain, anxiety, HTN, CKD,  that presents to [facility] as a transfer from [facility] on 8/10, with respiratory failure and possible contained duodenal perforation. She was transferred to MICU the following day for vent mngmt since there wasn't any evid of perf per gen surg.     24hr events: No further fevers. Did have large volume emesis overnight, KUB concerning for ileus, TF held NG to LIWS. Tolerating UF of -200    Dispo: ICU    Neurological   #acute on chronic pain, sedation  #acute encephalopathy:  - presented to OSH altered, but also hypoxic at that time.  - improved prior to extubation and immed following re-intubation (FSC nodding y/n)  - mentation limited by sedation for vent compliance and comfort  - analegsia prn  - propofol dc'd 8/20  - inc'd oxy to 10 mg scheduled every 6 hrs 8/19 >> n/v noted following doses, changed to IV narcotic  - added lidoderm patch to back and tylenol prn to minimize narc    Pulmonary   #acute hypoxic respiratory failure likely due to multifocal PNA, ARDS.  Intubated on 8/1 at OSH CT chest with Scattered airspace opacities in the left apex, left upper lobe and superior segment of left lower lobe; concerning for pneumonia.  Extubated 8/12 and reint 8/13 for progressive hypoxia and inc WOB   - previously failed SBT d/t inc RR and low Vt (100s)    - pt and family amenable to trach/peg if fails extubation  - continue to optimize volume status then trial extubation again  - tolerating PRVC well >> changed to PSV 8/25  - Optimal PEEP 14 per esophogeal balloon eval (8/14) >> wean to SBT as tol since oxygenation has improved and tolerating vent wean  - pulm toilet as tol, HOB>30deg  - peridex for vap ppx     Cardiovascular   #acute HFrEF  #h/o hypertension   - hold home metoprolol and amlodipine   - prn hydralazine  - Echo 7/29: EF 65-70%, mod pulm HTN and borderline dilated RV   - repeat echo 8/16 EF >55%, severe RV fx and dilated RA, severe phtn, mod TR    #Hypotension, resolved:  - developed pressor requirement 8/15  - Continue norepi gtt for MAP>65, currently off 8/22  - tolerating increased UF for several days    Renal   #Acute on chronic renal failure  - Cr baseline appears to be ~1.7--> 3-->2.87  - nephrology consulted 8/16, appreciate recs >> CRRT started 8/22, do not plan to restart of clots, will trial iHD  - consider pure wick urinary incontinence system instead of foley    Infectious Disease/Autoimmune   #septic shock due to ? PNA.  Initially seen at OSH and had c/f perforated duodenal ulcer, but CT scan here  non concerning.  CT chest with possible multifocal PNA.    - appreciate ID's assistance. ID signed off 8/17  - WOCN consulted for intertrigo and sacral wound (see recs 8/19)  - last Blood/urine/sputum cxs sent 8/21, abx given x 1 dose    Abx:   Cefepime (8/22- 8/22)  Fluconazole (8/22- 8/22)  Vanco (8/11-->8/12)  Mica (8/11-->8/12)  Flagyl (8/10-->8/14)  Cefepime  (8/10-->8/16)  Voriconazole (8/12-->8/15)  Linezolid (8/15-8/16)    Abx: OSH  ceftaroline and flagyl  levaquin  meropenem     Cx OSH:  - 7/29 Strep pneumo Ag neg, RVP neg, urine Legionella Ag neg  - 7/29 bd cx NGTD  OSH: Fungal, pneumocystis/histoplasma, AFB, Gram on BAL all were negative.    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  Per surgery, NO perf.      #abd pain:   - pt endorsed abd pain, prior to reintubation >> cont to have tenderness on palpation  - lipase elevated on 8/20, f/u in am    #ileus  - n/v noted overnight, zofran given, TF held  - KUB reveals dilated loops of bowel concerning for ileus  - cont NG to LIWS and hold TF    Malnutrition Assessment: Not done yet.  Patient does not meet AND/ASPEN criteria for malnutrition at this time (06/13/18 1522)- Tol Tf well at goal  - FMS for liquid stools, output unchanged   - consider cdiff if spikes temp or new leukocytosis    Heme/Coag   #Critical illness anemia  - Hgb drop to 5.8 at OSH with transfusion  - last transfusion 2 units PRBC 8/20-8/21, no s/s of bleeding  - HH, plts, coags acceptable  - no evid active bleeding  - SCDs  and heparin for DVT ppx      Endocrine   #Hypoglycemia  - cont accu check while TF on hold  - glucose within target range no  - cont dextrose 5NS at 49ml/hr prn while TF held    Prophylaxis/LDA/Restraints/Consults   Can CVC be removed? No: need for medications requiring central access (e.g. pressors)   Can A-line be removed? No: frequent ABGs  Can Foley be removed? Yes  Mobility plan: Step 3 - Bed in chair position    Feeding: NPO for procedure  Analgesia: Pain adequately controlled  Sedation SAT/SBT: Yes  Thrombembolic ppx: SQ heparin  Head of bed: >30 deg  Ulcer ppx: Yes, mechanical ventilation > 3 days, not on tube feeds  Glucose within target range: Yes, in range    RASS at goal? Yes  [place] Agitation Assessment Scale (RASS) : -1 (06/17/2018 10:00 AM)     Can antipsychotics be stopped? N/A, not on antipsychotics  CAM-ICU Result: Positive (06/16/2018  8:00 PM)      Patient Lines/Drains/Airways Status    Active Active Lines, Drains, & Airways     Name: Placement date:   Placement time:   Site:   Days:    ETT  7.5   06/05/18    0037     12    CVC Triple Lumen 06/03/18 Non-tunneled Right Internal jugular   06/03/18    1503    Internal jugular   13  NG/OG Tube Right nostril   06/02/18    2121    Right nostril   14    Urethral Catheter Temperature probe 16 Fr.   06/17/18    0245    Temperature probe   less than 1    Arterial Line 06/03/18 Right Radial   06/03/18    0007    Radial   14    Hemodialysis Catheter 06/14/18 Left Internal jugular   06/14/18    1400    Internal jugular   2              Patient Lines/Drains/Airways Status    Active Wounds     Name:   Placement date:   Placement time:   Site:   Days:    Wound 06/08/18 Pressure Injury Buttocks Posterior pink Stage 2   06/08/18    0001    Buttocks   9    Wound 06/09/18 Rash/Dermititis Perineum   06/09/18    2154    Perineum   7                Goals of Care     Code Status: Full Code    Designated Healthcare Decision Maker:  Ms. Tailor currently lacks decisional capacity for healthcare decision-making and is unable to designate a surrogate healthcare decision maker. Ms. Vila designated healthcare decision maker(s) is/are joci dress and [name] (the patient's adult child) as denoted by hospital policy for patients without a known preference.      Subjective   Katherine Norton is a 62 y.o. female with PMHx of liver transplant (1999) on immunosuppresion, chronic back pain, anxiety, HTN, CKD,  that presents to [facility] as a transfer from [facility]. Patient initially presented to the OSH on 05/20/18 due to acute worsening shortness of breath and altered mental status. Patient was sating 68% on RA, but 90% on CPAP. Patient had severe acidemia on ABG as well as acute on chronic renal failure with Cr of 3.89. CXR at that time showed consolidation with concern for pneumonia and was febrile. Patient's initial lactate was 2.70. Patient was started on IV ceftaroline and flagyl initially.      Objective Vitals - past 24 hours  Temp:  [36.7 ??C (98 ??F)-36.8 ??C (98.2 ??F)] 36.7 ??C (98.1 ??F)  Heart Rate:  [76-117] 87  SpO2 Pulse:  [76-116] 87  Resp:  [16-36] 20  FiO2 (%):  [30 %] 30 %  SpO2:  [93 %-100 %] 100 % Intake/Output  I/O last 3 completed shifts:  In: 1070 [NG/GT:1020; IV Piggyback:50]  Out: 6055 [Urine:145; Emesis/NG output:50; Other:5560; Stool:300]     Physical Exam:    General: obese female, A&O person, place, year, nods y/n   HEENT: PERRLA  CV: RRR, no mrg  Pulm: coarse bs bilaterally  GI: soft, distended, intmt tender on palaption  MSK: no edema  Skin: intact x intertrigo under bilateral breasts, panniculus, and perineal area  Neuro: awake, FC, mae, gen weakness, antigrav BUE, BLE 2/5      Continuous Infusions:   ??? NxStage RFP 400 5000 mL - contains 2 mEq/L of potassium     ??? NxStage RFP 401 5000 mL - contains 4 mEq/L of potassium     ??? NxStage RFP 401 BB 5000 mL - contains 4 mEq/L of potassium         Scheduled Medications:   ??? calcitonin (salmon)  1 spray Alternating Nares Daily (RT)   ??? chlorhexidine  5 mL Mouth BID   ??? cycloSPORINE  50 mg NG tube BID   ??? esomeprazole  40 mg NG tube daily   ??? heparin (porcine) for subcutaneous use  5,000 Units Subcutaneous [facility]   ??? oxyCODONE  10 mg NG tube Q6H       PRN medications: acetaminophen, albuterol, dextrose 5 % and sodium chloride 0.9 %, heparin (porcine), heparin (porcine), MORPhine injection, ondansetron, oxyCODONE      Data/Imaging Review: Reviewed in Epic and personally interpreted on 06/17/2018. See EMR for detailed results.      Critical Care Attestation     This patient is critically ill or injured with the impairment of vital organ systems such that there is a high probability of imminent or life threatening deterioration in the patient's condition. This patient must remain in the ICU for ongoing evaluation of the comprehensive management plan outlined in this note. I directly provided critical care services as documented in this note and the critical care time spent (30 min) is exclusive of separately billable procedures.    Kenyon Eshleman Fonnie Mu, ACNP

## 2018-06-18 LAB — BLOOD GAS CRITICAL CARE PANEL, ARTERIAL
BASE EXCESS ARTERIAL: 1.1 (ref -2.0–2.0)
BASE EXCESS ARTERIAL: -0.3 (ref -2.0–2.0)
BASE EXCESS ARTERIAL: -1.2 (ref -2.0–2.0)
BASE EXCESS ARTERIAL: -2.6 — ABNORMAL LOW (ref -2.0–2.0)
CALCIUM IONIZED ARTERIAL (MG/DL): 4.94 mg/dL (ref 4.40–5.40)
CALCIUM IONIZED ARTERIAL (MG/DL): 4.75 mg/dL (ref 4.40–5.40)
CALCIUM IONIZED ARTERIAL (MG/DL): 4.78 mg/dL (ref 4.40–5.40)
CALCIUM IONIZED ARTERIAL (MG/DL): 4.62 mg/dL (ref 4.40–5.40)
CALCIUM IONIZED ARTERIAL (MG/DL): 4.67 mg/dL (ref 4.40–5.40)
FIO2 ARTERIAL: 40
FIO2 ARTERIAL: 60
GLUCOSE WHOLE BLOOD: 61 mg/dL
GLUCOSE WHOLE BLOOD: 63 mg/dL
GLUCOSE WHOLE BLOOD: 72 mg/dL
HCO3 ARTERIAL: 26 mmol/L (ref 22–27)
HCO3 ARTERIAL: 24 mmol/L (ref 22–27)
HCO3 ARTERIAL: 24 mmol/L (ref 22–27)
HEMOGLOBIN BLOOD GAS: 6.9 g/dL — ABNORMAL LOW (ref 12.00–16.00)
HEMOGLOBIN BLOOD GAS: 6.4 g/dL — ABNORMAL LOW (ref 12.00–16.00)
HEMOGLOBIN BLOOD GAS: 7.6 g/dL — ABNORMAL LOW (ref 12.00–16.00)
HEMOGLOBIN BLOOD GAS: 7.9 g/dL — ABNORMAL LOW (ref 12.00–16.00)
HEMOGLOBIN BLOOD GAS: 7.5 g/dL — ABNORMAL LOW (ref 12.00–16.00)
LACTATE BLOOD ARTERIAL: 0.5 mmol/L (ref <=1.2)
LACTATE BLOOD ARTERIAL: 0.4 mmol/L (ref <=1.2)
O2 SATURATION ARTERIAL: 88.7 % — ABNORMAL LOW (ref 94.0–100.0)
O2 SATURATION ARTERIAL: 95.5 % (ref 94.0–100.0)
O2 SATURATION ARTERIAL: 87.8 % — ABNORMAL LOW (ref 94.0–100.0)
O2 SATURATION ARTERIAL: 97.6 % (ref 94.0–100.0)
PCO2 ARTERIAL: 44 mmHg (ref 35.0–45.0)
PCO2 ARTERIAL: 42.9 mmHg (ref 35.0–45.0)
PCO2 ARTERIAL: 45 mmHg (ref 35.0–45.0)
PCO2 ARTERIAL: 43.6 mmHg (ref 35.0–45.0)
PCO2 ARTERIAL: 42.1 mmHg (ref 35.0–45.0)
PH ARTERIAL: 7.38 (ref 7.35–7.45)
PH ARTERIAL: 7.37 (ref 7.35–7.45)
PH ARTERIAL: 7.34 — ABNORMAL LOW (ref 7.35–7.45)
PH ARTERIAL: 7.37 (ref 7.35–7.45)
PH ARTERIAL: 7.34 — ABNORMAL LOW (ref 7.35–7.45)
PO2 ARTERIAL: 167 mmHg — ABNORMAL HIGH (ref 80.0–110.0)
PO2 ARTERIAL: 54.8 mmHg — ABNORMAL LOW (ref 80.0–110.0)
PO2 ARTERIAL: 79.1 mmHg — ABNORMAL LOW (ref 80.0–110.0)
PO2 ARTERIAL: 52.5 mmHg — ABNORMAL LOW (ref 80.0–110.0)
PO2 ARTERIAL: 94.9 mmHg (ref 80.0–110.0)
POTASSIUM WHOLE BLOOD: 4.3 mmol/L (ref 3.4–4.6)
POTASSIUM WHOLE BLOOD: 4.1 mmol/L (ref 3.4–4.6)
POTASSIUM WHOLE BLOOD: 4.3 mmol/L (ref 3.4–4.6)
POTASSIUM WHOLE BLOOD: 4 mmol/L (ref 3.4–4.6)
SODIUM WHOLE BLOOD: 138 mmol/L (ref 135–145)
SODIUM WHOLE BLOOD: 137 mmol/L (ref 135–145)
SODIUM WHOLE BLOOD: 138 mmol/L (ref 135–145)
SODIUM WHOLE BLOOD: 136 mmol/L (ref 135–145)

## 2018-06-18 LAB — CBC
HEMATOCRIT: 21 % — ABNORMAL LOW (ref 36.0–46.0)
HEMOGLOBIN: 7.7 g/dL — ABNORMAL LOW (ref 12.0–16.0)
MEAN CORPUSCULAR HEMOGLOBIN CONC: 32.6 g/dL (ref 31.0–37.0)
MEAN CORPUSCULAR HEMOGLOBIN: 31.3 pg (ref 26.0–34.0)
MEAN CORPUSCULAR HEMOGLOBIN: 31.4 pg (ref 26.0–34.0)
MEAN CORPUSCULAR VOLUME: 97.6 fL (ref 80.0–100.0)
MEAN PLATELET VOLUME: 9.9 fL (ref 7.0–10.0)
MEAN PLATELET VOLUME: 10.3 fL — ABNORMAL HIGH (ref 7.0–10.0)
PLATELET COUNT: 116 10*9/L — ABNORMAL LOW (ref 150–440)
PLATELET COUNT: 115 10*9/L — ABNORMAL LOW (ref 150–440)
RED BLOOD CELL COUNT: 2.15 10*12/L — ABNORMAL LOW (ref 4.00–5.20)
RED BLOOD CELL COUNT: 2.46 10*12/L — ABNORMAL LOW (ref 4.00–5.20)
RED CELL DISTRIBUTION WIDTH: 17.3 % — ABNORMAL HIGH (ref 12.0–15.0)
RED CELL DISTRIBUTION WIDTH: 17.5 % — ABNORMAL HIGH (ref 12.0–15.0)
WBC ADJUSTED: 3.1 10*9/L — ABNORMAL LOW (ref 4.5–11.0)
WBC ADJUSTED: 3.5 10*9/L — ABNORMAL LOW (ref 4.5–11.0)

## 2018-06-18 LAB — COMPREHENSIVE METABOLIC PANEL
ALBUMIN: 2.7 g/dL — ABNORMAL LOW (ref 3.5–5.0)
ALBUMIN: 2.8 g/dL — ABNORMAL LOW (ref 3.5–5.0)
ALKALINE PHOSPHATASE: 76 U/L (ref 38–126)
ALKALINE PHOSPHATASE: 74 U/L (ref 38–126)
ALT (SGPT): 14 U/L — ABNORMAL LOW (ref 15–48)
ALT (SGPT): 14 U/L — ABNORMAL LOW (ref 15–48)
ANION GAP: 9 mmol/L (ref 9–15)
ANION GAP: 6 mmol/L — ABNORMAL LOW (ref 9–15)
AST (SGOT): 42 U/L — ABNORMAL HIGH (ref 14–38)
BILIRUBIN TOTAL: 1 mg/dL (ref 0.0–1.2)
BILIRUBIN TOTAL: 1.6 mg/dL — ABNORMAL HIGH (ref 0.0–1.2)
BLOOD UREA NITROGEN: 16 mg/dL (ref 7–21)
BLOOD UREA NITROGEN: 17 mg/dL (ref 7–21)
BUN / CREAT RATIO: 9
BUN / CREAT RATIO: 9
CALCIUM: 8.6 mg/dL (ref 8.5–10.2)
CALCIUM: 8.6 mg/dL (ref 8.5–10.2)
CHLORIDE: 104 mmol/L (ref 98–107)
CHLORIDE: 106 mmol/L (ref 98–107)
CO2: 23 mmol/L (ref 22.0–30.0)
CO2: 23 mmol/L (ref 22.0–30.0)
CREATININE: 1.71 mg/dL — ABNORMAL HIGH (ref 0.60–1.00)
CREATININE: 1.89 mg/dL — ABNORMAL HIGH (ref 0.60–1.00)
EGFR CKD-EPI AA FEMALE: 36 mL/min/{1.73_m2} — ABNORMAL LOW (ref >=60)
EGFR CKD-EPI AA FEMALE: 32 mL/min/{1.73_m2} — ABNORMAL LOW (ref >=60)
EGFR CKD-EPI NON-AA FEMALE: 31 mL/min/{1.73_m2} — ABNORMAL LOW (ref >=60)
EGFR CKD-EPI NON-AA FEMALE: 28 mL/min/{1.73_m2} — ABNORMAL LOW (ref >=60)
GLUCOSE RANDOM: 60 mg/dL — ABNORMAL LOW (ref 65–179)
POTASSIUM: 4.4 mmol/L (ref 3.5–5.0)
POTASSIUM: 4.7 mmol/L (ref 3.5–5.0)
PROTEIN TOTAL: 6.6 g/dL (ref 6.5–8.3)
PROTEIN TOTAL: 7.1 g/dL (ref 6.5–8.3)
SODIUM: 136 mmol/L (ref 135–145)
SODIUM: 135 mmol/L (ref 135–145)

## 2018-06-18 LAB — MAGNESIUM
Magnesium:MCnc:Pt:Ser/Plas:Qn:: 1.8
Magnesium:MCnc:Pt:Ser/Plas:Qn:: 1.8

## 2018-06-18 LAB — HEMOGLOBIN BLOOD GAS
Hemoglobin:MCnc:Pt:Bld:Qn:: 7.5 — ABNORMAL LOW
Hemoglobin:MCnc:Pt:Bld:Qn:: 7.6 — ABNORMAL LOW

## 2018-06-18 LAB — HCO3 ARTERIAL: Bicarbonate:SCnc:Pt:BldA:Qn:: 26

## 2018-06-18 LAB — PROTIME
Lab: 14.6 — ABNORMAL HIGH
Lab: 14.6 — ABNORMAL HIGH

## 2018-06-18 LAB — PHOSPHORUS
Phosphate:MCnc:Pt:Ser/Plas:Qn:: 3.3
Phosphate:MCnc:Pt:Ser/Plas:Qn:: 3.9

## 2018-06-18 LAB — APTT
Coagulation surface induced:Time:Pt:PPP:Qn:Coag: 33.5
HEPARIN CORRELATION: 0.2

## 2018-06-18 LAB — CO2: Carbon dioxide:SCnc:Pt:Ser/Plas:Qn:: 23

## 2018-06-18 LAB — PH ARTERIAL: pH:LsCnc:Pt:BldA:Qn:: 7.37

## 2018-06-18 LAB — HEPARIN CORRELATION: Lab: 0.2

## 2018-06-18 LAB — FIO2 ARTERIAL: Blood gas studies:Cmplx:-:^Patient:Set:: 40

## 2018-06-18 LAB — MEAN PLATELET VOLUME
Lab: 10.3 — ABNORMAL HIGH
Lab: 9.9

## 2018-06-18 LAB — PROTIME-INR: INR: 1.28

## 2018-06-18 LAB — CYCLOSPORINE (FPIA) BLOOD: Lab: 17

## 2018-06-18 LAB — EGFR CKD-EPI NON-AA FEMALE: Lab: 28 — ABNORMAL LOW

## 2018-06-18 NOTE — Unmapped (Signed)
ATTEMPTED HFNC EARLIER, PT REMAINS ANXIOUS.  PLACED BACK TO BIPAP AND CHANGED TO SMALL FF MASK WHICH DID SEEM TO BE A BETTER FIT FOR PT.  WILL CONT TO MONITOR.

## 2018-06-18 NOTE — Unmapped (Signed)
Pt alert, oriented x3 and able to follow commands/move all extremities. BP stable. HR low ST. Bipap overnight, 4L [place] while awake, respirations seem to be mid 30s while pt is resting due to anxiety, states her breathing feels fine. Bed percussion used throughout shift, encouraging cough that is very weak at this time. Foley/FMS in place, diminished output. NGT to LWIS, minimal output at this time. Stage two to sacrum, mepilex in place. Break down to skin folds, crusted per wound RN. Q2 turns. Family at bedside and updated throughout shift. Will ctm.     Problem: Adult Inpatient Plan of Care  Goal: Plan of Care Review  Outcome: Ongoing - Unchanged  Goal: Patient-Specific Goal (Individualization)  Outcome: Ongoing - Unchanged  Goal: Absence of Hospital-Acquired Illness or Injury  Outcome: Ongoing - Unchanged  Goal: Optimal Comfort and Wellbeing  Outcome: Ongoing - Unchanged  Goal: Readiness for Transition of Care  Outcome: Ongoing - Unchanged  Goal: Rounds/Family Conference  Outcome: Ongoing - Unchanged     Problem: Latex Allergy  Goal: Absence of Allergy Symptoms  Outcome: Ongoing - Unchanged     Problem: Skin Injury Risk Increased  Goal: Skin Health and Integrity  Outcome: Ongoing - Unchanged     Problem: Fall Injury Risk  Goal: Absence of Fall and Fall-Related Injury  Outcome: Ongoing - Unchanged     Problem: Communication Impairment (Mechanical Ventilation, Invasive)  Goal: Effective Communication  Outcome: Ongoing - Unchanged     Problem: Device-Related Complication Risk (Mechanical Ventilation, Invasive)  Goal: Optimal Device Function  Outcome: Ongoing - Unchanged     Problem: Inability to Wean (Mechanical Ventilation, Invasive)  Goal: Mechanical Ventilation Liberation  Outcome: Ongoing - Unchanged     Problem: Nutrition Impairment (Mechanical Ventilation, Invasive)  Goal: Optimal Nutrition Delivery  Outcome: Ongoing - Unchanged     Problem: Skin and Tissue Injury (Mechanical Ventilation, Invasive)  Goal: Absence of Device-Related Skin and Tissue Injury  Outcome: Ongoing - Unchanged     Problem: Ventilator-Induced Lung Injury (Mechanical Ventilation, Invasive)  Goal: Absence of Ventilator-Induced Lung Injury  Outcome: Ongoing - Unchanged     Problem: Self-Care Deficit  Goal: Improved Ability to Complete Activities of Daily Living  Outcome: Ongoing - Unchanged     Problem: Hypertension Comorbidity  Goal: Blood Pressure in Desired Range  Outcome: Ongoing - Unchanged     Problem: Pain Chronic (Persistent) (Comorbidity Management)  Goal: Acceptable Pain Control and Functional Ability  Outcome: Ongoing - Unchanged     Problem: Fluid Imbalance (Pneumonia)  Goal: Fluid Balance  Outcome: Ongoing - Unchanged     Problem: Infection (Pneumonia)  Goal: Resolution of Infection Signs/Symptoms  Outcome: Ongoing - Unchanged     Problem: Respiratory Compromise (Pneumonia)  Goal: Effective Oxygenation and Ventilation  Outcome: Ongoing - Unchanged

## 2018-06-18 NOTE — Unmapped (Signed)
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 female with  PMHx of liver transplant (1999) on immunosuppresion, chronic back pain, anxiety, HTN, CKD,  that presents to [facility] as a transfer from [facility] on 8/10, with respiratory failure and possible contained duodenal perforation. She was transferred to MICU the following day for vent mngmt since there wasn't any evid of perf per gen surg.     24hr events: No issues overnight. Remained on bipap, CRRT clotted overnight    Dispo: ICU    Neurological   #acute on chronic pain, sedation  #acute encephalopathy:  - presented to OSH altered, but also hypoxic at that time.  - improved prior to extubation and immed following re-intubation (FSC nodding y/n)  - mentation limited by sedation for vent compliance and comfort  - analegsia prn  - propofol dc'd 8/20  - inc'd oxy to 10 mg scheduled every 6 hrs 8/19 >> n/v noted following doses, changed to IV narcotic  - cont lidoderm patch to back and tylenol prn to minimize narc  - intmt anxiety, likely r/t pain, improves w prn analgesia    Pulmonary   #acute hypoxic respiratory failure likely due to multifocal PNA, ARDS.  Intubated on 8/1 at OSH CT chest with Scattered airspace opacities in the left apex, left upper lobe and superior segment of left lower lobe; concerning for pneumonia.  Extubated 8/12 and reint 8/13 for progressive hypoxia and inc WOB   - pt and family amenable to trach/peg if fails extubation  - extubated 8/25  - optimal PEEP 14 per esophogeal balloon eval (8/14) >> remains extubated on bipap  - monitor WOB and abg  - aggressive pulm toilet as tol, HOB>30deg    Cardiovascular   #acute HFrEF  #h/o hypertension   - hold home metoprolol and amlodipine   - prn hydralazine  - Echo 7/29: EF 65-70%, mod pulm HTN and borderline dilated RV   - repeat echo 8/16 EF >55%, severe RV fx and dilated RA, severe phtn, mod TR    #Hypotension, resolved:  - developed pressor requirement 8/15  - previously on norepi gtt for MAP>65, currently off 8/22  - previously tolerating increased UF for several days    Renal   #Acute on chronic renal failure  - Cr baseline appears to be ~1.7--> 3-->2.87  - nephrology consulted 8/16, appreciate recs >> CRRT started 8/22, clotted off 8/25 will trial iHD in am  - consider pure wick urinary incontinence system instead of foley    Infectious Disease/Autoimmune   #septic shock due to ? PNA.  Initially seen at OSH and had c/f perforated duodenal ulcer, but CT scan here non concerning.  CT chest with possible multifocal PNA.    - appreciate ID's assistance. ID signed off 8/17  - WOCN consulted for intertrigo and sacral wound (see recs 8/19)  - last Blood/urine/sputum cxs sent 8/21, abx given x  1 dose    Abx:   Cefepime (8/22- 8/22)  Fluconazole (8/22- 8/22)  Vanco (8/11-->8/12)  Mica (8/11-->8/12)  Flagyl (8/10-->8/14)  Cefepime  (8/10-->8/16)  Voriconazole (8/12-->8/15)  Linezolid (8/15-8/16)    Abx: OSH  ceftaroline and flagyl  levaquin  meropenem     Cx OSH:  - 7/29 Strep pneumo Ag neg, RVP neg, urine Legionella Ag neg  - 7/29 bd cx NGTD  OSH: Fungal, pneumocystis/histoplasma, AFB, Gram on BAL all were negative.    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  Per surgery, NO perf.      #abd pain:   - pt endorsed abd pain, prior to reintubation >> cont to have tenderness on palpation  - lipase elevated on 8/20, f/u in am    #ileus  - n/v noted overnight 8/25, zofran given, TF held  - KUB reveals dilated loops of bowel concerning for ileus  - cont NG to LIWS and hold TF >> consider trial trickle feeds if output low    Malnutrition Assessment: Not done yet.  Patient does not meet AND/ASPEN criteria for malnutrition at this time (06/13/18 1522)  - FMS for liquid stools, output reduced >> consider remove today  - consider cdiff if spikes temp or new leukocytosis    Heme/Coag   #Critical illness anemia  - Hgb drop to 5.8 at OSH with transfusion  - last transfusion 2 units PRBC 8/20-8/21 and 1unit 8/26, no s/s of bleeding  - plts, coags acceptable  - SCDs  and heparin for DVT ppx      Endocrine   #Hypoglycemia  - cont accu check while TF on hold  - glucose within target range no  - cont dextrose 5NS at 57ml/hr prn while TF held    Prophylaxis/LDA/Restraints/Consults   Can CVC be removed? No: need for medications requiring central access (e.g. pressors)   Can A-line be removed? No: frequent ABGs  Can Foley be removed? Yes  Mobility plan: Step 3 - Bed in chair position    Feeding: NPO for procedure  Analgesia: Pain adequately controlled  Sedation SAT/SBT: Yes  Thrombembolic ppx: SQ heparin  Head of bed: >30 deg  Ulcer ppx: Yes, mechanical ventilation > 3 days, not on tube feeds  Glucose within target range: Yes, in range    RASS at goal? Yes  [place] Agitation Assessment Scale (RASS) : 0 (06/18/2018 12:00 PM)     Can antipsychotics be stopped? N/A, not on antipsychotics  CAM-ICU Result: Negative (06/18/2018  8:00 AM)      Patient Lines/Drains/Airways Status    Active Active Lines, Drains, & Airways     Name:   Placement date:   Placement time:   Site:   Days:    CVC Triple Lumen 06/03/18 Non-tunneled Right Internal jugular   06/03/18    1503    Internal jugular 14    NG/OG Tube Right nostril   06/02/18    2121    Right nostril   15    Urethral Catheter Temperature probe 16 Fr.   06/17/18    0245    Temperature probe   1    Arterial Line 06/03/18 Right Radial   06/03/18  0007    Radial   15    Hemodialysis Catheter 06/14/18 Left Internal jugular   06/14/18    1400    Internal jugular   4              Patient Lines/Drains/Airways Status    Active Wounds     Name:   Placement date:   Placement time:   Site:   Days:    Wound 06/08/18 Pressure Injury Buttocks Posterior pink Stage 2   06/08/18    0001    Buttocks   10    Wound 06/09/18 Rash/Dermititis Perineum   06/09/18    2154    Perineum   8    Wound 06/18/18 Abdomen Lower   06/18/18    1102    Abdomen   less than 1    Wound 06/18/18 Rash/Dermititis Breast Left;Lower   06/18/18    1108    Breast   less than 1                Goals of Care     Code Status: Full Code    Designated Healthcare Decision Maker:  Ms. Mcnulty currently lacks decisional capacity for healthcare decision-making and is unable to designate a surrogate healthcare decision maker. Ms. Fullilove designated healthcare decision maker(s) is/are saranne crislip and [name] (the patient's adult child) as denoted by hospital policy for patients without a known preference.      Subjective   Katherine Norton is a 62 y.o. female with PMHx of liver transplant (1999) on immunosuppresion, chronic back pain, anxiety, HTN, CKD,  that presents to [facility] as a transfer from [facility]. Patient initially presented to the OSH on 05/20/18 due to acute worsening shortness of breath and altered mental status. Patient was sating 68% on RA, but 90% on CPAP. Patient had severe acidemia on ABG as well as acute on chronic renal failure with Cr of 3.89. CXR at that time showed consolidation with concern for pneumonia and was febrile. Patient's initial lactate was 2.70. Patient was started on IV ceftaroline and flagyl initially.      Objective     Vitals - past 24 hours  Heart Rate:  [92-118] 98  SpO2 Pulse:  [92-117] 106  Resp:  [16-51] 32  FiO2 (%):  [40 %-50 %] 40 %  SpO2:  [89 %-100 %] 100 % Intake/Output  I/O last 3 completed shifts:  In: 630 [I.V.:400; NG/GT:230]  Out: 4474 [Urine:205; Emesis/NG output:520; UJWJX:9147; Stool:100]     Physical Exam:    General: obese female, A&O person, place, year, nods y/n   HEENT: PERRLA  CV: RRR, no mrg  Pulm: coarse bs bilaterally  GI: soft, distended, intmt tender on palaption  MSK: no edema  Skin: intact x intertrigo under bilateral breasts, panniculus, and perineal area  Neuro: awake, FC, mae, gen weakness, antigrav BUE, BLE 2/5      Continuous Infusions:   ??? dextrose 5 % and sodium chloride 0.9 % 25 mL/hr (06/18/18 0600)   ??? NxStage RFP 400 5000 mL - contains 2 mEq/L of potassium     ??? NxStage RFP 401 5000 mL - contains 4 mEq/L of potassium     ??? NxStage RFP 401 BB 5000 mL - contains 4 mEq/L of potassium     ??? sodium chloride         Scheduled Medications:   ??? calcitonin (salmon)  1 spray Alternating Nares Daily (RT)   ??? cycloSPORINE  50 mg NG tube BID   ??? heparin (porcine) for subcutaneous use  5,000 Units Subcutaneous Q8H [facility]   ??? lidocaine  1 patch Transdermal Daily   ??? oxyCODONE  10 mg NG tube Q6H   ??? pantoprazole (PROTONIX) IVPB  40 mg Intravenous Daily       PRN medications: acetaminophen, albuterol, dextrose 5 % and sodium chloride 0.9 %, heparin (porcine), heparin (porcine), MORPhine injection, ondansetron, oxyCODONE      Data/Imaging Review: Reviewed in Epic and personally interpreted on 06/18/2018. See EMR for detailed results.      Critical Care Attestation     This patient is critically ill or injured with the impairment of vital organ systems such that there is a high probability of imminent or life threatening deterioration in the patient's condition. This patient must remain in the ICU for ongoing evaluation of the comprehensive management plan outlined in this note. I directly provided critical care services as documented in this note and the critical care time spent (30 min) is exclusive of separately billable procedures.    [place] Fonnie Mu, ACNP

## 2018-06-18 NOTE — Unmapped (Signed)
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 female with ??PMHx of liver transplant (1999) on immunosuppresion, chronic back pain, anxiety, HTN, CKD,????that presents to [facility] as a transfer from [facility], with respiratory failure and possible contained duodenal perforation.      WOC RN follow up for intertrigo and pressure injury to the sacrum.  Patient found with zinc oxide in folds and a plastic blue chuck over abdomen tucked into folds.  Bedside education provided regarding appropriate moisture associated skin damage treatment. Skin cleansed thoroughly, all folds crusted, and Interdry Ag placed under bilateral breasts, panniculus.  Wound to sacrum is beginning to epithelialize. Continue with plan of care.      06/18/18 1215   Wound 06/08/18 Pressure Injury Buttocks Posterior pink Stage 2   Date First Assessed/Time First Assessed: 06/08/18 0001   Present on Hospital Admission: No  Primary Wound Type: Pressure Injury  Location: Buttocks  Wound Location Orientation: Posterior  Wound Description (Comments): pink  Staging: Stage 2  Medical D...   Wound Image    Wound Length (cm) 4 cm   Wound Width (cm) 1 cm   Wound Depth (cm) 0.1 cm   Wound Surface Area (cm^2) 4 cm^2   Wound Volume (cm^3) 0.4 cm^3   Wound Healing % -567   Wound Bed Brown;Pink   Odor None   Peri-wound Assessment      Intact   Tunneling      No   Undermining     No   Treatments Cleansed/Irrigation   Dressing Silicone foam bordered dressing        06/18/18 1102   Wound 06/18/18 Abdomen Lower   Date First Assessed/Time First Assessed: 06/18/18 1102   Location: Abdomen  Wound Location Orientation: Lower   Wound Image    Wound Length (cm) 0.5 cm   Wound Width (cm) 25 cm   Wound Depth (cm) 0.2 cm   Wound Surface Area (cm^2) 12.5 cm^2   Wound Volume (cm^3) 2.5 cm^3   Wound Bed Red   Exudate Type      Sero-sanguineous   Exudate Amnt      Scant   Tunneling      No   Undermining     No   Treatments Cleansed/Irrigation   Dressing Other (Comment)  (Crusting and interdr AG)      06/18/18 1108   Wound 06/18/18 Rash/Dermititis Breast Left;Lower   Date First Assessed/Time First  Assessed: 06/18/18 1108   Primary Wound Type: Rash/Dermititis  Location: Breast  Wound Location Orientation: Left;Lower   Wound Image    Wound Length (cm) 0.5 cm   Wound Width (cm) 3 cm   Wound Depth (cm) 0.2 cm   Wound Surface Area (cm^2) 1.5 cm^2   Wound Volume (cm^3) 0.3 cm^3   Wound Bed Red   Odor None   Exudate Type      Sero-sanguineous   Exudate Amnt      Scant   Treatments Cleansed/Irrigation;Other (Comment)  (Crusted)   Dressing   (InterdryAg)       Continence Status:   Incontinence of bladder: Foley in place  Incontinent of bowel: Fecal management system in place    Moisture Associated Skin Damage:   - Intertrigo     Lab Results   Component Value Date    WBC 3.1 (L) 06/18/2018    HGB 6.7 (L) 06/18/2018    HCT 21.0 (L) 06/18/2018    ESR 45 (H) 01/17/2013    CRP <0.5 01/17/2013    A1C 5.0 07/04/2003    GLU 60 (L) 06/18/2018    ALBUMIN 2.7 (L) 06/18/2018    PROT 6.6 06/18/2018       Support Surface:   - Low Air Loss - ICU    Offloading:  Left: Pillow  Right: Pillow    Type Debridement Completed By WOCN:  N/A    Teaching:  - Moisture management  - Offloading  - Turning and repositioning  - Wound care    WOCN Recommendations:   - See nursing orders for wound care instructions.  - Contact WOCN with questions, concerns, or wound deterioration.     Alternate turning and repositioning patient to left and right sides. Avoid supine positioning unless medically necessary. ??  Reposition/offload patient frequently when seated. Limit time in chair to 2 hours for pressure injuries on sitting surfaces.    Sacrum:  1. Cleanse wound with Normal Saline and 4 x 4 gauze, pat dry.  2. Apply mepilex border sacrum large silicone bordered foam ([id number]) to wound.   3. Change every other day/PRN if dislodged, soiled or saturated.    ** Please begin crusting and zinc if FMS removed and incontinence necessitates dressing to be changed more than once per shift.     Under breasts and panniculus:  1. Remove Interdry Ag (skin fold management fabric) [phone]) daily to cleanse skin, cleanse with water and pat dry.  2. Apply antifungal powder in skin folds.   3. Dab or spray No Sting Barrier over the powder to seal it in.  4. Place Interdry Ag inside the skin fold allowing 2 of the fabric to extend outside the fold to allow moisture to evaporate from skin.    Skin Integrity Protocol:  1. Assess all at risk bony prominence for skin changes once per shift.  2. Turn and reposition patient every two hours while in bed.  3. Keep HOB ??no greater than 30 degrees.   4. Use foam chair cushion- Geomatt [phone]) when OOB in chair/ reposition every hour while in chair.  5. For prevention, apply silicone bordered foam dressing to at risk bony prominence except for heels; change every other day and prn if soiled/not intact.   6. Keep heels elevated off bed with pillows placed length-wise under calves OR elevated off bed with Heel Offloading Boot [phone])    Topical Therapy/Interventions:   - Crusting (stoma powder or antifungal powder)  - Moisture wicking  fabric with silver  - Silicone bordered foam    Recommended Consults:  - Currently followed by dietitian    WOCN Follow Up:  - Weekly    Plan of Care Discussed With:   - Patient  - Family  - RN Letitia Caul IV Mikey    Supplies Ordered: No    Workup Time:   60 minutes     Cherylann Ratel MA BSN RN [facility]  [facility] Nurse Consult Service  Pager [phone]  Phone [phone]

## 2018-06-18 NOTE — Unmapped (Signed)
Pt on Bipap throughout the night. Attempted large bore HFNC for approximately 45 minutes. Pt became very agitated, tachycardic and tachypneic. Has weak, non-productive cough. Bed percussion utilized approximately 6 times to break up secretions. Responds well to bed percussion with decreased agitation and stable Vs. Low grade fever overnight. Remains NPO d/t ileus. NGT to low-intermittent suction. No other c/c. Will continue to monitor.       Problem: Adult Inpatient Plan of Care  Goal: Plan of Care Review  Outcome: Progressing  Goal: Patient-Specific Goal (Individualization)  Outcome: Progressing  Goal: Absence of Hospital-Acquired Illness or Injury  Outcome: Progressing  Goal: Optimal Comfort and Wellbeing  Outcome: Progressing  Goal: Readiness for Transition of Care  Outcome: Progressing  Goal: Rounds/Family Conference  Outcome: Progressing     Problem: Latex Allergy  Goal: Absence of Allergy Symptoms  Outcome: Progressing     Problem: Skin Injury Risk Increased  Goal: Skin Health and Integrity  Outcome: Progressing     Problem: Fall Injury Risk  Goal: Absence of Fall and Fall-Related Injury  Outcome: Progressing     Problem: Communication Impairment (Mechanical Ventilation, Invasive)  Goal: Effective Communication  Outcome: Progressing     Problem: Device-Related Complication Risk (Mechanical Ventilation, Invasive)  Goal: Optimal Device Function  Outcome: Progressing     Problem: Inability to Wean (Mechanical Ventilation, Invasive)  Goal: Mechanical Ventilation Liberation  Outcome: Progressing     Problem: Nutrition Impairment (Mechanical Ventilation, Invasive)  Goal: Optimal Nutrition Delivery  Outcome: Progressing     Problem: Skin and Tissue Injury (Mechanical Ventilation, Invasive)  Goal: Absence of Device-Related Skin and Tissue Injury  Outcome: Progressing     Problem: Ventilator-Induced Lung Injury (Mechanical Ventilation, Invasive)  Goal: Absence of Ventilator-Induced Lung Injury  Outcome: Progressing Problem: Self-Care Deficit  Goal: Improved Ability to Complete Activities of Daily Living  Outcome: Progressing     Problem: Hypertension Comorbidity  Goal: Blood Pressure in Desired Range  Outcome: Progressing     Problem: Pain Chronic (Persistent) (Comorbidity Management)  Goal: Acceptable Pain Control and Functional Ability  Outcome: Progressing     Problem: Fluid Imbalance (Pneumonia)  Goal: Fluid Balance  Outcome: Progressing     Problem: Infection (Pneumonia)  Goal: Resolution of Infection Signs/Symptoms  Outcome: Progressing     Problem: Respiratory Compromise (Pneumonia)  Goal: Effective Oxygenation and Ventilation  Outcome: Progressing

## 2018-06-18 NOTE — Unmapped (Signed)
All care explained to pt and her family as performed. Pt was successfully extubated without complications.

## 2018-06-18 NOTE — Unmapped (Addendum)
Cyclosporine Therapeutic Monitoring Pharmacy Note    Katherine Norton is a 62 y.o. female continuing cyclosporine NEORAL.     Indication: Liver transplant     Date of Transplant: 05/15/1995      Prior Dosing Information:     Current regimen Neoral 75mg  BID      Goals:  Therapeutic Drug Levels  Cyclosporine trough goal: 50-100 ng/mL    Additional Clinical Monitoring/Outcomes  ? Monitor renal function (SCr and urine output) and liver function (LFTs)  ? Monitor for signs/symptoms of adverse events (e.g., hyperglycemia, hyperkalemia, hypomagnesemia, hypertension, headache, tremor)    Results:   Cyclosporine level: 63 ng/mL, drawn at 0543 ~ 2.5h prior dose given    Pharmacokinetic Considerations and Significant Drug Interactions:  ? Concurrent hepatotoxic medications: None identified  ? Concurrent CYP3A4 substrates/inhibitors: None identified  ? Concurrent nephrotoxic medications: None identified    Assessment/Plan:  Recommendation(s)  ? Continue current regimen of NEORAL 75mg  bid   ? Adjust dose based on 9/2 level     Follow-up  ? Next level has been ordered on every Mon + Thus prior to AM dose at 0700.   ? A pharmacist will continue to monitor and recommend levels as appropriate    Please page service pharmacist with questions/clarifications.    Drue Flirt, PharmD

## 2018-06-19 DIAGNOSIS — J9601 Acute respiratory failure with hypoxia: Principal | ICD-10-CM

## 2018-06-19 LAB — CBC
HEMATOCRIT: 25 % — ABNORMAL LOW (ref 36.0–46.0)
HEMOGLOBIN: 8 g/dL — ABNORMAL LOW (ref 12.0–16.0)
MEAN CORPUSCULAR HEMOGLOBIN CONC: 32.1 g/dL (ref 31.0–37.0)
MEAN CORPUSCULAR HEMOGLOBIN: 31.3 pg (ref 26.0–34.0)
MEAN CORPUSCULAR VOLUME: 97.5 fL (ref 80.0–100.0)
PLATELET COUNT: 139 10*9/L — ABNORMAL LOW (ref 150–440)
RED BLOOD CELL COUNT: 2.56 10*12/L — ABNORMAL LOW (ref 4.00–5.20)
RED CELL DISTRIBUTION WIDTH: 17.5 % — ABNORMAL HIGH (ref 12.0–15.0)
WBC ADJUSTED: 2.5 10*9/L — ABNORMAL LOW (ref 4.5–11.0)

## 2018-06-19 LAB — COMPREHENSIVE METABOLIC PANEL
ALBUMIN: 2.8 g/dL — ABNORMAL LOW (ref 3.5–5.0)
ALKALINE PHOSPHATASE: 90 U/L (ref 38–126)
ALT (SGPT): 12 U/L — ABNORMAL LOW (ref 15–48)
ANION GAP: 7 mmol/L — ABNORMAL LOW (ref 9–15)
AST (SGOT): 30 U/L (ref 14–38)
BILIRUBIN TOTAL: 1.2 mg/dL (ref 0.0–1.2)
BLOOD UREA NITROGEN: 20 mg/dL (ref 7–21)
CALCIUM: 8.5 mg/dL (ref 8.5–10.2)
CHLORIDE: 105 mmol/L (ref 98–107)
CO2: 25 mmol/L (ref 22.0–30.0)
CREATININE: 2.64 mg/dL — ABNORMAL HIGH (ref 0.60–1.00)
EGFR CKD-EPI NON-AA FEMALE: 19 mL/min/{1.73_m2} — ABNORMAL LOW (ref >=60)
GLUCOSE RANDOM: 66 mg/dL (ref 65–179)
POTASSIUM: 4.4 mmol/L (ref 3.5–5.0)
PROTEIN TOTAL: 6.9 g/dL (ref 6.5–8.3)
SODIUM: 137 mmol/L (ref 135–145)

## 2018-06-19 LAB — APTT: Coagulation surface induced:Time:Pt:PPP:Qn:Coag: 33.9

## 2018-06-19 LAB — HEPATITIS B SURFACE ANTIBODY: HEPATITIS B SURFACE ANTIBODY QUANT: <8 m[IU]/mL (ref <8.00)

## 2018-06-19 LAB — PROTEIN TOTAL: Protein:MCnc:Pt:Ser/Plas:Qn:: 6.9

## 2018-06-19 LAB — MAGNESIUM: Magnesium:MCnc:Pt:Ser/Plas:Qn:: 1.9

## 2018-06-19 LAB — BLOOD GAS, ARTERIAL
BASE EXCESS ARTERIAL: -4.5 — ABNORMAL LOW (ref -2.0–2.0)
HCO3 ARTERIAL: 21 mmol/L — ABNORMAL LOW (ref 22–27)
O2 SATURATION ARTERIAL: 98.6 % (ref 94.0–100.0)
PH ARTERIAL: 7.3 — ABNORMAL LOW (ref 7.35–7.45)
PO2 ARTERIAL: 135 mmHg — ABNORMAL HIGH (ref 80.0–110.0)

## 2018-06-19 LAB — PO2 ARTERIAL: Oxygen:PPres:Pt:BldA:Qn:: 135 — ABNORMAL HIGH

## 2018-06-19 LAB — PROTIME: Lab: 15.2 — ABNORMAL HIGH

## 2018-06-19 LAB — PHOSPHORUS
PHOSPHORUS: 4.8 mg/dL — ABNORMAL HIGH (ref 2.9–4.7)
Phosphate:MCnc:Pt:Ser/Plas:Qn:: 4.8 — ABNORMAL HIGH

## 2018-06-19 LAB — HEPATITIS C ANTIBODY: Hepatitis C virus Ab:PrThr:Pt:Ser:Ord:: NONREACTIVE

## 2018-06-19 LAB — PROTIME-INR
INR: 1.33
PROTIME: 15.2 s — ABNORMAL HIGH (ref 10.2–12.8)

## 2018-06-19 LAB — MEAN PLATELET VOLUME: Lab: 10.1 — ABNORMAL HIGH

## 2018-06-19 LAB — HEPATITIS B CORE TOTAL ANTIBODY: Hepatitis B virus core Ab:PrThr:Pt:Ser/Plas:Ord:IA: NONREACTIVE

## 2018-06-19 LAB — HEPATITIS B SURFACE ANTIBODY QUANT: Hepatitis B virus surface Ab:ACnc:Pt:Ser:Qn:: <8

## 2018-06-19 LAB — HEPATITIS B SURFACE ANTIGEN: Hepatitis B virus surface Ag:PrThr:Pt:Ser:Ord:: NONREACTIVE

## 2018-06-19 NOTE — Unmapped (Signed)
VSS

## 2018-06-19 NOTE — Unmapped (Signed)
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 female with  PMHx of liver transplant (1999) on immunosuppresion, chronic back pain, anxiety, HTN, CKD,  that presents to [facility] as a transfer from [facility] on 8/10, with respiratory failure and possible contained duodenal perforation. She was transferred to MICU the following day for vent mngmt since there wasn't any evid of perf per gen surg.     24hr events: No issues overnight. conts bipap/hiflo    Dispo: ICU    Neurological   #acute on chronic pain, sedation  #acute encephalopathy:  - presented to OSH altered, but also hypoxic at that time.  - improved prior to extubation and immed following re-intubation (FSC nodding y/n)  - cont lidoderm patch to back and tylenol prn to minimize narc  - intmt anxiety, likely r/t pain, improves w prn analgesia  - decrease oxy to 5 q 6    Pulmonary   #acute hypoxic respiratory failure likely due to multifocal PNA, ARDS.  Intubated on 8/1 at OSH CT chest with Scattered airspace opacities in the left apex, left upper lobe and superior segment of left lower lobe; concerning for pneumonia.  Extubated 8/12 and reint 8/13 for progressive hypoxia and inc WOB .  Extubated 8/25  - pt and family amenable to trach/peg if fails extubation  - optimal PEEP 14 per esophogeal balloon eval (8/14) >> remains extubated on bipap  - monitor WOB and abg  - aggressive pulm toilet as tol    Cardiovascular   #acute HFrEF  #h/o hypertension   - hold home metoprolol and amlodipine   - prn hydralazine  - Echo 7/29: EF 65-70%, mod pulm HTN and borderline dilated RV   - repeat echo 8/16 EF >55%, severe RV fx and dilated RA, severe phtn, mod TR    #Hypotension, resolved:  - developed pressor requirement 8/15  - previously on norepi gtt for MAP>65, currently off 8/22    Renal   #Acute on chronic renal failure  - Cr baseline appears to be ~1.7--> 3-->2.87  - nephrology consulted 8/16, appreciate recs >> CRRT started 8/22, clotted off 8/25 will trial iHD today  - consider pure wick urinary incontinence system instead of foley    Infectious Disease/Autoimmune   #septic shock due to ? PNA.  Initially seen at OSH and had c/f perforated duodenal ulcer, but CT scan here non concerning.  CT chest with possible multifocal PNA.    - appreciate ID's assistance. ID signed off 8/17  - WOCN consulted for intertrigo and sacral wound (see recs 8/19)  - last Blood/urine/sputum cxs sent 8/21, abx given x 1 dose    Abx:   Cefepime (8/22- 8/22)  Fluconazole (8/22- 8/22)  Vanco (8/11-->8/12)  Mica (8/11-->8/12)  Flagyl (8/10-->8/14)  Cefepime  (8/10-->8/16)  Voriconazole (8/12-->8/15)  Linezolid (8/15-8/16)    Abx: OSH  ceftaroline and flagyl  levaquin  meropenem  Cx OSH:  - 7/29 Strep pneumo Ag neg, RVP neg, urine Legionella Ag neg  - 7/29 bd cx NGTD  OSH: Fungal, pneumocystis/histoplasma, AFB, Gram on BAL all were negative.    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  Per surgery, NO perf.      #abd pain:   - pt endorsed abd pain, prior to reintubation >> cont to have tenderness on palpation  - lipase elevated on 8/20, f/u in am    #ileus  - n/v noted overnight 8/25, zofran given, TF held  - KUB reveals dilated loops of bowel concerning for ileus  - cont NG to LIWS and hold TF >> Bowel regimen today     Malnutrition Assessment: Not done yet.  Patient does not meet AND/ASPEN criteria for malnutrition at this time (06/13/18 1522)  - FMS for liquid stools, output reduced >> consider remove today  - consider cdiff if spikes temp or new leukocytosis    Heme/Coag   #Critical illness anemia  - Hgb drop to 5.8 at OSH with transfusion  - last transfusion 2 units PRBC 8/20-8/21 and 1unit 8/26, no s/s of bleeding  - plts, coags acceptable  - SCDs  and heparin for DVT ppx      Endocrine   #Hypoglycemia  - cont accu check while TF on hold  - glucose within target range no  - cont dextrose 5NS at 58ml/hr prn while TF held    Prophylaxis/LDA/Restraints/Consults     CHECKLIST   Can CVC be removed? No: dialysis catheter   Can A-line be removed? no  Can Foley be removed? No: Has open wounds and incontinent  Mobility plan: Step 1 - Range of motion    Feeding: Trickle feeds, no advancement  Analgesia: Pain adequately controlled  Sedation SAT/SBT: N/A  Thrombembolic ppx: SQ heparin  Head of bed >30 degrees: Yes  Ulcer ppx: Yes, home use continued  Glucose within target range: Not in range, titrating medications    RASS at goal? Yes  [place] Agitation Assessment Scale (RASS) : 0 (06/19/2018 12:00 PM)     Can antipsychotics be stopped? N/A, not on antipsychotics  CAM-ICU Result: Positive (06/19/2018  8:00 AM)      Patient Lines/Drains/Airways Status    Active Active Lines, Drains, & Airways     Name:   Placement date:   Placement time:   Site:   Days:    CVC Triple Lumen 06/03/18 Non-tunneled Right Internal jugular   06/03/18    1503    Internal jugular   15    NG/OG Tube Right nostril   06/02/18    2121    Right nostril   16    Urethral Catheter Temperature probe 16 Fr.   06/17/18    0245    Temperature probe   2    Arterial Line 06/03/18 Right Radial   06/03/18    0007    Radial   16    Hemodialysis Catheter 06/14/18 Left Internal jugular 06/14/18    1400    Internal jugular   4              Patient Lines/Drains/Airways Status    Active  Wounds     Name:   Placement date:   Placement time:   Site:   Days:    Wound 06/08/18 Pressure Injury Buttocks Posterior pink Stage 2   06/08/18    0001    Buttocks   11    Wound 06/09/18 Rash/Dermititis Perineum   06/09/18    2154    Perineum   9    Wound 06/18/18 Abdomen Lower   06/18/18    1102    Abdomen   less than 1    Wound 06/18/18 Rash/Dermititis Breast Left;Lower   06/18/18    1108    Breast   less than 1                Goals of Care     Code Status: Full Code    Designated Healthcare Decision Maker:  Ms. Noguera currently lacks decisional capacity for healthcare decision-making and is unable to designate a surrogate healthcare decision maker. Ms. Bellavance designated healthcare decision maker(s) is/are juliette standre and [name] (the patient's adult child) as denoted by hospital policy for patients without a known preference.      Subjective   Katherine Norton is a 62 y.o. female with PMHx of liver transplant (1999) on immunosuppresion, chronic back pain, anxiety, HTN, CKD,  that presents to [facility] as a transfer from [facility]. Patient initially presented to the OSH on 05/20/18 due to acute worsening shortness of breath and altered mental status. Patient was sating 68% on RA, but 90% on CPAP. Patient had severe acidemia on ABG as well as acute on chronic renal failure with Cr of 3.89. CXR at that time showed consolidation with concern for pneumonia and was febrile. Patient's initial lactate was 2.70. Patient was started on IV ceftaroline and flagyl initially.      Objective     Vitals - past 24 hours  Heart Rate:  [88-118] 92  SpO2 Pulse:  [88-117] 96  Resp:  [16-51] 16  FiO2 (%):  [40 %-60 %] 40 %  SpO2:  [89 %-100 %] 95 % Intake/Output  I/O last 3 completed shifts:  In: 1415 [I.V.:900; Blood:235; NG/GT:180; IV Piggyback:100]  Out: 690 [Urine:490; Emesis/NG output:150; Stool:50]     Physical Exam: General: obese female, A&O person, place, year, nods y/n   HEENT: PERRLA  CV: RRR, no mrg  Pulm: coarse bs bilaterally  GI: soft, distended, intmt tender on palaption  MSK: no edema  Skin: intact x intertrigo under bilateral breasts, panniculus, and perineal area  Neuro: awake, FC, mae, gen weakness, antigrav BUE, BLE 2/5      Continuous Infusions:   ??? dextrose 5 % and sodium chloride 0.9 % 25 mL/hr (06/19/18 0600)       Scheduled Medications:   ??? calcitonin (salmon)  1 spray Alternating Nares Daily (RT)   ??? cycloSPORINE  75 mg NG tube BID   ??? famotidine (PEPCID) IV  20 mg Intravenous Daily   ??? heparin (porcine) for subcutaneous use  5,000 Units Subcutaneous Q8H [facility]   ??? lidocaine  1 patch Transdermal Daily   ??? oxyCODONE  10 mg NG tube Q6H       PRN medications: acetaminophen, albuterol, dextrose 5 % and sodium chloride 0.9 %, heparin (porcine), heparin (porcine), hydrALAZINE, MORPhine injection, ondansetron, oxyCODONE      Data/Imaging Review: Reviewed in Epic and personally interpreted on 06/19/2018. See EMR for detailed results.      Critical Care Attestation  This patient is critically ill or injured with the impairment of vital organ systems such that there is a high probability of imminent or life threatening deterioration in the patient's condition. This patient must remain in the ICU for ongoing evaluation of the comprehensive management plan outlined in this note. I directly provided critical care services as documented in this note and the critical care time spent (30 min) is exclusive of separately billable procedures.    Jamie Hafford Paulino Door, PA

## 2018-06-19 NOTE — Unmapped (Signed)
Patient continues to use NPPV with good compliance.

## 2018-06-19 NOTE — Unmapped (Signed)
Monitor labs and weight.  Maintain Hd schedules as ordered.

## 2018-06-19 NOTE — Unmapped (Signed)
Pt. Is awake, follows simple commends, answering simple questions yes/no, MAE, c/o back pain- Tylenol PRN-given, + Oxycodone as ordered. Max temp37.3, Tele-NSR, HR in 90's, SBP stable, I HD- in progress now, so far no complications, + generalized edema. Pt. Is on HF, 40%/30L, Sats>96%, LS- CTA in upper and diminished at the bases, bed P&V performed, no cough. BiPAP at noc and when Pt. Is sleeping. Pt. Is with NG, clamped for now d/t medication administration, Foley D/C'd today. Coccyx- stage 2. Will cont monitoring.

## 2018-06-19 NOTE — Unmapped (Signed)
HEMODIALYSIS NURSE PROCEDURE NOTE    Treatment Number:  1 Room/Station:  Critical Care (Specify Unit & Room)(4327) Procedure Date:  06/19/18   Total Treatment Time:  210 Min.    CONSENT:  Written consent was obtained prior to the procedure and is detailed in the medical record. Prior to the start of the procedure, a time out was taken and the identity of the patient was confirmed via name, medical record number and date of birth.     WEIGHTS:  Hemodialysis Pre-Treatment Weights     Date/Time Pre-Treatment Weight (kg) Estimated Dry Weight (kg) Patient Goal Weight (kg) Total Goal Weight (kg)    06/19/18 1054  ??? utw  ??? tbd  2.5 kg (5 lb 8.2 oz)  3.05 kg (6 lb 11.6 oz)           Hemodialysis Post Treatment Weights     Date/Time Post-Treatment Weight (kg) Treatment Weight Change (kg)    06/19/18 1440  ??? utw d/t icu status  ???        Active Dialysis Orders (168h ago, onward)     Start     Ordered    06/21/18 0700  Hemodialysis inpatient  Every Tue,Thu,Sat     Question Answer Comment   K+ 2 meq/L    Ca++ 2.5 meq/L    Bicarb 35 meq/L    Na+ 137 meq/L    Na+ Modeling none    Dialyzer [id number]    Dialysate Temperature (C) 36    BFR-As tolerated to a maximum of: 400 mL/min    DFR 800 mL/min    Duration of treatment 3.5 Hr    Dry weight (kg) TBD    Challenge dry weight (kg) NO    Fluid removal (L) 2.5L ON 8/27    Tubing Adult = 142 ml    Access Site Dialysis Catheter    Access Site Location Right    Keep SBP >: 90        06/19/18 1052              ACCESS SITE:       Hemodialysis Catheter 06/14/18 Left Internal jugular (Active)   Site Assessment Clean 06/19/2018  2:40 PM   Status Deaccessed 06/19/2018  2:40 PM   Dressing Intervention New dressing 06/14/2018  6:20 PM   Dressing Status      Intact/not removed 06/19/2018  2:40 PM   Verification by X-ray Yes 06/16/2018  4:00 PM   Site Condition No complications 06/19/2018  2:40 PM   Dressing Type Occlusive;Transparent 06/19/2018  2:40 PM   Dressing Drainage Description Sanguineous 06/15/2018 4:00 PM   Dressing Change Due 06/22/18 06/19/2018  2:40 PM   Line Necessity Reviewed? Y 06/19/2018  2:40 PM   Line Necessity Indications Yes - Hemodialysis 06/19/2018  2:40 PM   Line Necessity Reviewed With MDI 06/19/2018 12:00 PM           Catheter Fill Volumes:  Arterial:  1.5 mL Venous:  1.7 mL   Catheter filled with hep post procedure.    Patient Lines/Drains/Airways Status    Active Peripheral & Central Intravenous Access     Name:   Placement date:   Placement time:   Site:   Days:    CVC Triple Lumen 06/03/18 Non-tunneled Right Internal jugular   06/03/18    1503    Internal jugular   16              LAB RESULTS:  Lab Results  Component Value Date    NA 137 06/19/2018    K 4.4 06/19/2018    CL 105 06/19/2018    CO2 25.0 06/19/2018    BUN 20 06/19/2018    CALCIUM 8.5 06/19/2018    CAION 4.5 06/19/2018    PHOS 4.8 (H) 06/19/2018    MG 1.9 06/19/2018    PTH 293.0 12/05/2017    TOPIRAMATE 12.2 12/05/2017    IRON 202 (H) 05/30/2013    LABIRON >100 (H) 05/30/2013    TRANSFERRIN 138 (L) 05/30/2013    FERRITIN 944 01/07/2010    TIBC 174 (L) 05/30/2013     Lab Results   Component Value Date    WBC 2.5 (L) 06/19/2018    HGB 8.0 (L) 06/19/2018    HCT 25.0 (L) 06/19/2018    PLT 139 (L) 06/19/2018    PHART 7.30 (L) 06/19/2018    PO2ART 135.0 (H) 06/19/2018    PCO2ART 43.7 06/19/2018    HCO3ART 21 (L) 06/19/2018    BEART -4.5 (L) 06/19/2018    O2SATART 98.6 06/19/2018    APTT 33.9 06/19/2018    HEPBSAG Negative 10/09/2014      VITAL SIGNS:  Temperature     Date/Time Temp Temp src      06/19/18 1440  36.9 ??C (98.4 ??F)  Oral         Hemodynamics     Date/Time Pulse BP MAP (mmHg) Arterial Line BP    06/19/18 1440  97  ???  ???  ???    06/19/18 1430  92  ???  ???  ???    06/19/18 1415  107  ???  ???  ???    06/19/18 1400  94  ???  ???  ???    06/19/18 1345  93  ???  ???  ???    06/19/18 1330  93  ???  ???  ???    06/19/18 1315  92  ???  ???  ???    06/19/18 1300  89  ???  ???  ???    06/19/18 1245  91  ???  ???  ???    06/19/18 1230  93  ???  ???  ???    06/19/18 1215  90  ???  ???  ??? 06/19/18 1200  88  ???  ???  ???    06/19/18 1145  91  ???  ???  ???    06/19/18 1130  94  ???  ???  ???    06/19/18 1110  101  ???  ???  ???    Date/Time Arterial Line MAP Arterial Line BP 2 Arterial Line MAP Patient Position    06/19/18 1440  ???  174/83  114 mmHg  ???    06/19/18 1430  ???  175/84  115 mmHg  ???    06/19/18 1415  ???  157/71  102 mmHg  ???    06/19/18 1400  ???  168/80  111 mmHg  Lying    06/19/18 1345  ???  168/80  111 mmHg  ???    06/19/18 1330  ???  164/78  107 mmHg  ???    06/19/18 1315  ???  159/76  104 mmHg  ???    06/19/18 1300  ???  166/80  109 mmHg  ???    06/19/18 1245  ???  163/79  108 mmHg  ???    06/19/18 1230  ???  164/86  115 mmHg  ???    06/19/18 1215  ???  158/79  107 mmHg  ???    06/19/18 1200  ???  150/74  101 mmHg  Lying    06/19/18 1145  ???  148/74  100 mmHg  ???    06/19/18 1130  ???  139/71  95 mmHg  ???    06/19/18 1110  ???  137/72  96 mmHg  ???          Oxygen Therapy     Date/Time Resp SpO2 O2 Device FiO2 (%) O2 Flow Rate (L/min)    06/19/18 1440  26  100 %  ???  30 %  ???    06/19/18 1430  (!) 32  100 %  ???  30 %  ???    06/19/18 1415  (!) 37  (!) 89 %  ???  30 %  ???    06/19/18 1400  (!) 32  95 %  HFNC  30 %  ???    06/19/18 1345  (!) 36  94 %  ???  30 %  ???    06/19/18 1330  (!) 32  94 %  ???  30 %  ???    06/19/18 1315  26  94 %  ???  30 %  ???    06/19/18 1300  26  93 %  ???  30 %  ???    06/19/18 1245  (!) 33  94 %  ???  30 %  ???    06/19/18 1230  28  94 %  ???  30 %  ???    06/19/18 1215  (!) 32  95 %  ???  30 %  ???    06/19/18 1200  28  95 %  HFNC  40 %  30 L/min    06/19/18 1145  22  96 %  ???  30 %  ???    06/19/18 1130  23  95 %  ???  30 %  ???    06/19/18 1110  25  96 %  ???  30 %  ???        Oxygen Connected to Wall:  yes    Pre-Hemodialysis Assessment     Date/Time Therapy Number Dialyzer All Psychologist, counselling Dialysis Flow (mL/min)    06/19/18 1245  ???  ???  ???  ???  ???    06/19/18 1054  1  F-180 (98 mLs)  Yes  Engaged  800 mL/min    Date/Time Verify Priming Solution Priming Volume Hemodialysis Independent pH Hemodialysis Machine Conductivity (mS/cm) Hemodialysis Independent Conductivity (mS/cm)    06/19/18 1245  ???  ???  7.1  13.9 mS/cm  13.8 mS/cm    06/19/18 1054  0.9% NS  300 mL  7.1  13.8 mS/cm  13.9 mS/cm    Date/Time Bicarb Conductivity Residual Bleach Negative Free Chlorine Total Chlorine Chloramine    06/19/18 1245 --  ??? --  ??? --    06/19/18 1054 --  Yes --  0 --        Pre-Hemodialysis Treatment Comments     Date/Time Pre-Hemodialysis Comments    06/19/18 1054  awake        Hemodialysis Treatment     Date/Time Blood Flow Rate (mL/min) Arterial Pressure (mmHg) Venous Pressure (mmHg) Transmembrane Pressure (mmHg)    06/19/18 1440  ???  ???  ???  ???    06/19/18 1430  400 mL/min  -120 mmHg  150 mmHg  40 mmHg    06/19/18 1415  400 mL/min  -120 mmHg  160 mmHg  40 mmHg    06/19/18 1400  400 mL/min  -120 mmHg  150 mmHg  40 mmHg    06/19/18 1345  400 mL/min  -120 mmHg  150 mmHg  40 mmHg    06/19/18 1330  400 mL/min  -150 mmHg  160 mmHg  40 mmHg    06/19/18 1315  400 mL/min  -150 mmHg  160 mmHg  40 mmHg    06/19/18 1300  400 mL/min  -150 mmHg  160 mmHg  40 mmHg    06/19/18 1245  400 mL/min  -110 mmHg  160 mmHg  40 mmHg    06/19/18 1230  400 mL/min  -110 mmHg  150 mmHg  40 mmHg    06/19/18 1215  400 mL/min  -110 mmHg  150 mmHg  40 mmHg    06/19/18 1200  400 mL/min  -110 mmHg  150 mmHg  40 mmHg    06/19/18 1145  400 mL/min  -110 mmHg  150 mmHg  40 mmHg    06/19/18 1130  400 mL/min  -110 mmHg  150 mmHg  40 mmHg    06/19/18 1110  400 mL/min  -190 mmHg  160 mmHg  40 mmHg    Date/Time Ultrafiltration Rate (mL/hr) Ultrafiltrate Removed (mL) Dialysate Flow Rate (mL/min) KECN (Kecn)    06/19/18 1440  ???  3040 mL  ???  ???    06/19/18 1430  870 mL/hr  2970 mL  800 ml/min  ???    06/19/18 1415  870 mL/hr  2671 mL  800 ml/min  ???    06/19/18 1400  870 mL/hr  2500 mL  800 ml/min  ???    06/19/18 1345  870 mL/hr  2250 mL  800 ml/min  ???    06/19/18 1330  870 mL/hr  2000 mL  800 ml/min  ???    06/19/18 1315  870 mL/hr  1800 mL  800 ml/min  ???    06/19/18 1300  870 mL/hr  1630 mL  800 ml/min  ???    06/19/18 1245 870 mL/hr  1380 mL  800 ml/min  ???    06/19/18 1230  870 mL/hr  1160 mL  800 ml/min  ???    06/19/18 1215  870 mL/hr  965 mL  800 ml/min  ???    06/19/18 1200  870 mL/hr  790 mL  800 ml/min  ???    06/19/18 1145  870 mL/hr  600 mL  800 ml/min  ???    06/19/18 1130  870 mL/hr  320 mL  800 ml/min  ???    06/19/18 1110  870 mL/hr  0 mL  800 ml/min  ???        Hemodialysis Treatment Comments     Date/Time Intra-Hemodialysis Comments    06/19/18 1440  ended tx    06/19/18 1430  rseting,no sob    06/19/18 1415  Repositioned.c/o sob     06/19/18 1400  vss    06/19/18 1345  eyes closed    06/19/18 1330  vss    06/19/18 1315  eyes closed,tolerated tx     06/19/18 1300  resting    06/19/18 1245  A line set changed by Primary RN    06/19/18 1230  seen by nephrology    06/19/18 1215  vss    06/19/18 1200  rest    06/19/18 1145  eyes closed    06/19/18 1130  VSS  06/19/18 1110  TX INITIATED W/O PROBLEM seen by MD         Post Treatment     Date/Time Rinseback Volume (mL) On Line Clearance: spKt/V Total Liters Processed (L/min) Dialyzer Clearance    06/19/18 1440  300 mL  ???  80.4 L/min  Moderately streaked        Post Hemodialysis Treatment Comments     Date/Time Post-Hemodialysis Comments    06/19/18 1440  vss        POST TREATMENT ASSESSMENT:  General appearance:  alert  Neurological:  Grossly normal  Lungs:  diminished breath sounds bilaterally  Hearts:  S1, S2 normal  Abdomen:  soft, non-tender; bowel sounds normal; no masses,  no organomegaly    Skin:  Skin color, texture, turgor normal. No rashes or lesions    Hemodialysis I/O     Date/Time Total Hemodialysis Replacement Volume (mL) Total Ultrafiltrate Output (mL)    06/19/18 1440  ???  2500 mL        [id number] - Medicaitons Given During Treatment  (last 4 hrs)         Malachi Bonds, RN       Medication Name Action Time Action Route Rate Dose User     dextrose 5 % and sodium chloride 0.9 % infusion 06/19/18 1200 Rate/Dose Verify Intravenous 25 mL/hr 25 mL/hr Malachi Bonds, RN     dextrose 5 % and sodium chloride 0.9 % infusion 06/19/18 1400 Rate/Dose Verify Intravenous 25 mL/hr 25 mL/hr Malachi Bonds, RN     heparin (porcine) injection 5,000 Units 06/19/18 1422 Given Subcutaneous  5,000 Units Malachi Bonds, RN     oxyCODONE (ROXICODONE) immediate release tablet 5 mg 06/19/18 1134 Given NG tube  5 mg Malachi Bonds, RN     sevelamer (RENAGEL) oral solution 06/19/18 1134 Not Given NG tube   Malachi Bonds, RN          Dorice Lamas, RN       Medication Name Action Time Action Route Rate Dose User     heparin (porcine) 1000 unit/mL injection 1,500 Units 06/19/18 1440 Given Intra-cannular  1,500 Units Kriss Ishler K Jerrian Mells, RN     heparin (porcine) 1000 unit/mL injection 1,700 Units 06/19/18 1440 Given Intra-cannular  1,700 Units Dorice Lamas, RN

## 2018-06-20 DIAGNOSIS — J9601 Acute respiratory failure with hypoxia: Principal | ICD-10-CM

## 2018-06-20 LAB — COMPREHENSIVE METABOLIC PANEL
ALBUMIN: 2.8 g/dL — ABNORMAL LOW (ref 3.5–5.0)
ALKALINE PHOSPHATASE: 101 U/L (ref 38–126)
ALT (SGPT): 10 U/L — ABNORMAL LOW (ref 15–48)
ANION GAP: 5 mmol/L — ABNORMAL LOW (ref 9–15)
AST (SGOT): 33 U/L (ref 14–38)
BILIRUBIN TOTAL: 0.9 mg/dL (ref 0.0–1.2)
BLOOD UREA NITROGEN: 10 mg/dL (ref 7–21)
BUN / CREAT RATIO: 5
CHLORIDE: 106 mmol/L (ref 98–107)
CO2: 25 mmol/L (ref 22.0–30.0)
EGFR CKD-EPI AA FEMALE: 29 mL/min/{1.73_m2} — ABNORMAL LOW (ref >=60)
EGFR CKD-EPI NON-AA FEMALE: 25 mL/min/{1.73_m2} — ABNORMAL LOW (ref >=60)
GLUCOSE RANDOM: 66 mg/dL (ref 65–99)
POTASSIUM: 3.8 mmol/L (ref 3.5–5.0)
PROTEIN TOTAL: 7.1 g/dL (ref 6.5–8.3)
SODIUM: 136 mmol/L (ref 135–145)

## 2018-06-20 LAB — BLOOD GAS CRITICAL CARE PANEL, ARTERIAL
BASE EXCESS ARTERIAL: -2.3 — ABNORMAL LOW (ref -2.0–2.0)
BASE EXCESS ARTERIAL: -2.5 — ABNORMAL LOW (ref -2.0–2.0)
CALCIUM IONIZED ARTERIAL (MG/DL): 4.52 mg/dL (ref 4.40–5.40)
CALCIUM IONIZED ARTERIAL (MG/DL): 4.67 mg/dL (ref 4.40–5.40)
FIO2 ARTERIAL: 100
GLUCOSE WHOLE BLOOD: 68 mg/dL
GLUCOSE WHOLE BLOOD: 71 mg/dL
HCO3 ARTERIAL: 23 mmol/L (ref 22–27)
HEMOGLOBIN BLOOD GAS: 8 g/dL — ABNORMAL LOW (ref 12.00–16.00)
HEMOGLOBIN BLOOD GAS: 8.7 g/dL — ABNORMAL LOW (ref 12.00–16.00)
LACTATE BLOOD ARTERIAL: 0.6 mmol/L (ref <=1.2)
LACTATE BLOOD ARTERIAL: 1 mmol/L (ref <=1.2)
O2 SATURATION ARTERIAL: 95.3 % (ref 94.0–100.0)
O2 SATURATION ARTERIAL: 98.1 % (ref 94.0–100.0)
PCO2 ARTERIAL: 44.8 mmHg (ref 35.0–45.0)
PH ARTERIAL: 7.31 — ABNORMAL LOW (ref 7.35–7.45)
PO2 ARTERIAL: 92.5 mmHg (ref 80.0–110.0)
POTASSIUM WHOLE BLOOD: 3.5 mmol/L (ref 3.4–4.6)
POTASSIUM WHOLE BLOOD: 3.6 mmol/L (ref 3.4–4.6)
SODIUM WHOLE BLOOD: 138 mmol/L (ref 135–145)

## 2018-06-20 LAB — PHOSPHORUS: Phosphate:MCnc:Pt:Ser/Plas:Qn:: 3.8

## 2018-06-20 LAB — BLOOD GAS, ARTERIAL
BASE EXCESS ARTERIAL: -3.2 — ABNORMAL LOW (ref -2.0–2.0)
BASE EXCESS ARTERIAL: -4.5 — ABNORMAL LOW (ref -2.0–2.0)
FIO2 ARTERIAL: 35
O2 SATURATION ARTERIAL: 97.6 % (ref 94.0–100.0)
O2 SATURATION ARTERIAL: 99 % (ref 94.0–100.0)
O2 SATURATION ARTERIAL: 98.2 % (ref 94.0–100.0)
PCO2 ARTERIAL: 47.8 mmHg — ABNORMAL HIGH (ref 35.0–45.0)
PCO2 ARTERIAL: 41.8 mmHg (ref 35.0–45.0)
PCO2 ARTERIAL: 40.8 mmHg (ref 35.0–45.0)
PH ARTERIAL: 7.36 (ref 7.35–7.45)
PO2 ARTERIAL: 106 mmHg (ref 80.0–110.0)
PO2 ARTERIAL: 147 mmHg — ABNORMAL HIGH (ref 80.0–110.0)
PO2 ARTERIAL: 104 mmHg (ref 80.0–110.0)

## 2018-06-20 LAB — CBC
HEMATOCRIT: 24.7 % — ABNORMAL LOW (ref 36.0–46.0)
HEMOGLOBIN: 8.1 g/dL — ABNORMAL LOW (ref 12.0–16.0)
MEAN CORPUSCULAR HEMOGLOBIN: 31.4 pg (ref 26.0–34.0)
MEAN PLATELET VOLUME: 10.1 fL — ABNORMAL HIGH (ref 7.0–10.0)
PLATELET COUNT: 155 10*9/L (ref 150–440)
RED BLOOD CELL COUNT: 2.57 10*12/L — ABNORMAL LOW (ref 4.00–5.20)
RED CELL DISTRIBUTION WIDTH: 16.7 % — ABNORMAL HIGH (ref 12.0–15.0)
WBC ADJUSTED: 2.6 10*9/L — ABNORMAL LOW (ref 4.5–11.0)

## 2018-06-20 LAB — PH ARTERIAL: pH:LsCnc:Pt:BldA:Qn:: 7.31 — ABNORMAL LOW

## 2018-06-20 LAB — FIO2 ARTERIAL: Blood gas studies:Cmplx:-:^Patient:Set:: 100

## 2018-06-20 LAB — SPECIMEN SOURCE

## 2018-06-20 LAB — PROTIME-INR: INR: 1.35

## 2018-06-20 LAB — ALT (SGPT): Alanine aminotransferase:CCnc:Pt:Ser/Plas:Qn:: 10 — ABNORMAL LOW

## 2018-06-20 LAB — MAGNESIUM: Magnesium:MCnc:Pt:Ser/Plas:Qn:: 1.7

## 2018-06-20 LAB — PROTIME: Lab: 15.4 — ABNORMAL HIGH

## 2018-06-20 LAB — BASE EXCESS ARTERIAL: Base excess:SCnc:Pt:BldA:Qn:Calculated: -4.5 — ABNORMAL LOW

## 2018-06-20 LAB — MEAN CORPUSCULAR VOLUME: Lab: 96.1

## 2018-06-20 LAB — APTT: Coagulation surface induced:Time:Pt:PPP:Qn:Coag: 34.2

## 2018-06-20 NOTE — Unmapped (Signed)
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 female with  PMHx of liver transplant (1999) on immunosuppresion, chronic back pain, anxiety, HTN, CKD,  that presents to [facility] as a transfer from [facility] on 8/10, with respiratory failure and possible contained duodenal perforation. She was transferred to MICU the following day for vent mngmt since there wasn't any evid of perf per gen surg.     24hr events:  Plugged right lung.  reintubated this morning.      Dispo: ICU    Neurological   #acute encephalopathy: resolved      #acute on chronic pain, sedation  - propofol for sedation goal RAS 0-1  -cont lidoderm patch to back and tylenol prn to minimize narc  -  oxy to 5 q 6 scheduled with prn    Pulmonary   #acute hypoxic respiratory failure likely due to multifocal PNA, ARDS.  Intubated on 8/1 at OSH CT chest with Scattered airspace opacities in the left apex, left upper lobe and superior segment of left lower lobe; concerning for pneumonia.  Extubated 8/12 and reint 8/13 for progressive hypoxia and inc WOB .  Extubated 8/25.  Reintubated on 8/28 am after R sided plug/collpase.    - pt and family amenable to trach/peg   - optimal PEEP 14 per esophogeal balloon eval (8/14) .  Consulted ENT, but unable to trach until PEEP < 10    - aggressive pulm toilet as tol  - hypertonic nebs    Cardiovascular   #acute HFrEF  #h/o hypertension   - hold home metoprolol and amlodipine   - prn hydralazine  - Echo 7/29: EF 65-70%, mod pulm HTN and borderline dilated RV   - repeat echo 8/16 EF >55%, severe RV fx and dilated RA, severe phtn, mod TR    #Hypotension, resolved:  - developed pressor requirement 8/15  - previously on norepi gtt for MAP>65, currently off 8/22    Renal   #Acute on chronic renal failure  - Cr baseline appears to be ~1.7--> 3-->2.87  - nephrology consulted 8/16, appreciate recs >> CRRT started 8/22, clotted off 8/25. First iHD on 8/27 with 2.5 L removed.  Tolerated well.    - foley removed 8/27    Infectious Disease/Autoimmune   #septic shock due to ? PNA.  Initially seen at OSH and had c/f perforated duodenal ulcer, but CT scan here non concerning.  CT chest with possible multifocal PNA.    - appreciate ID's assistance. ID signed off 8/17  - WOCN consulted for intertrigo and sacral wound (see recs 8/19)  - last Blood/urine/sputum cxs sent 8/21, abx given x 1 dose    Abx:   Cefepime (8/22- 8/22)  Fluconazole (8/22- 8/22)  Vanco (8/11-->8/12)  Mica (8/11-->8/12)  Flagyl (8/10-->8/14)  Cefepime  (8/10-->8/16)  Voriconazole (8/12-->8/15)  Linezolid (8/15-8/16)    Abx: OSH  ceftaroline and flagyl  levaquin  meropenem     Cx OSH:  - 7/29 Strep pneumo Ag neg, RVP neg, urine Legionella Ag neg  - 7/29 bd cx NGTD  OSH: Fungal, pneumocystis/histoplasma, AFB, Gram on BAL all were negative.    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  Per surgery, NO perf.      #abd pain:   - pt endorsed abd pain, prior to reintubation >> cont to have tenderness on palpation  - lipase elevated on 8/20, f/u in am    #ileus  - n/v noted overnight 8/25, zofran given, TF held  - KUB reveals dilated loops of bowel concerning for ileus  - cont NG to LIWS and hold TF >> Bowel regimen, with + BM   - resume TF on 8/28    Malnutrition Assessment: Not done yet.  Patient does not meet AND/ASPEN criteria for malnutrition at this time (06/13/18 1522)  - FMS for liquid stools, output reduced >> consider remove today  - consider cdiff if spikes temp or new leukocytosis    Heme/Coag   #Critical illness anemia  - Hgb drop to 5.8 at OSH with transfusion  - last transfusion 2 units PRBC 8/20-8/21 and 1unit 8/26, no s/s of bleeding  - plts, coags acceptable  - SCDs  and heparin for DVT ppx      Endocrine   #Hypoglycemia  - cont accu check while TF on hold  - glucose within target range no  - cont dextrose 5NS at 23ml/hr prn while TF held    Prophylaxis/LDA/Restraints/Consults     CHECKLIST   Can CVC be removed? No: dialysis catheter   Can A-line be removed? no  Can Foley be removed? removed  Mobility plan: Step 1 - Range of motion    Feeding: Trickle feeds, no advancement  Analgesia: Pain adequately controlled  Sedation SAT/SBT: N/A  Thrombembolic ppx: SQ heparin  Head of bed >30 degrees: Yes  Ulcer ppx: Yes, home use continued  Glucose within target range: Not in range, titrating medications    RASS at goal? Yes  [place] Agitation Assessment Scale (RASS) : 0 (06/20/2018  4:00 AM)     Can antipsychotics be stopped? N/A, not on antipsychotics  CAM-ICU Result: Positive (06/19/2018  8:00 AM)      Patient Lines/Drains/Airways Status    Active Active Lines, Drains, & Airways     Name:   Placement date:   Placement time:   Site:   Days:    CVC Triple Lumen 06/03/18 Non-tunneled Right Internal jugular   06/03/18    1503    Internal jugular   16    NG/OG Tube Right nostril   06/02/18    2121    Right nostril   17    Arterial Line 06/03/18 Right Radial   06/03/18    0007    Radial   17    Hemodialysis Catheter 06/14/18 Left Internal jugular   06/14/18    1400    Internal jugular   5              Patient Lines/Drains/Airways Status    Active Wounds     Name:   Placement date:   Placement  time:   Site:   Days:    Wound 06/08/18 Pressure Injury Buttocks Posterior pink Stage 2   06/08/18    0001    Buttocks   12    Wound 06/09/18 Rash/Dermititis Perineum   06/09/18    2154    Perineum   10    Wound 06/18/18 Abdomen Lower   06/18/18    1102    Abdomen   1    Wound 06/18/18 Rash/Dermititis Breast Left;Lower   06/18/18    1108    Breast   1                Goals of Care     Code Status: Full Code    Designated Healthcare Decision Maker:  Ms. Maclaren currently lacks decisional capacity for healthcare decision-making and is unable to designate a surrogate healthcare decision maker. Ms. Gamm designated healthcare decision maker(s) is/are sharmane dame and [name] (the patient's adult child) as denoted by hospital policy for patients without a known preference.      Subjective   Katherine Norton is a 62 y.o. female with PMHx of liver transplant (1999) on immunosuppresion, chronic back pain, anxiety, HTN, CKD,  that presents to [facility] as a [facility]. Patient initially presented to the OSH on 05/20/18 due to acute worsening shortness of breath and altered mental status. Patient was sating 68% on RA, but 90% on CPAP. Patient had severe acidemia on ABG as well as acute on chronic renal failure with Cr of 3.89. CXR at that time showed consolidation with concern for pneumonia and was febrile. Patient's initial lactate was 2.70. Patient was started on IV ceftaroline and flagyl initially.      Objective     Vitals - past 24 hours  Temp:  [36.9 ??C (98.4 ??F)-38 ??C (100.4 ??F)] 38 ??C (100.4 ??F)  Heart Rate:  [88-119] 107  SpO2 Pulse:  [89-121] 107  Resp:  [21-68] 35  FiO2 (%):  [30 %-80 %] 80 %  SpO2:  [89 %-100 %] 98 % Intake/Output  I/O last 3 completed shifts:  In: 1220 [I.V.:850; NG/GT:370]  Out: 2795 [Urine:295; Other:2500]     Physical Exam:    General: obese female, A&O person, place, year, nods y/n   HEENT: PERRLA  CV: RRR, no mrg  Pulm: coarse bs bilaterally  GI: soft, distended, intmt tender on palaption  MSK: no edema  Skin: intact x intertrigo under bilateral breasts, panniculus, and perineal area  Neuro: awake, FC, mae, gen weakness, antigrav BUE, BLE 2/5      Continuous Infusions:   ??? dextrose 5 % and sodium chloride 0.9 % 25 mL/hr (06/20/18 0600)       Scheduled Medications:   ??? albuterol       ??? calcitonin (salmon)  1 spray Alternating Nares Daily (RT)   ??? cycloSPORINE  75 mg NG tube BID   ??? famotidine (PEPCID) IV  20 mg Intravenous Daily   ??? heparin (porcine) for subcutaneous use  5,000 Units Subcutaneous Q8H [facility]   ??? lidocaine  1 patch Transdermal Daily   ??? oxyCODONE  5 mg NG tube Q6H   ??? polyethylene glycol  17 g Oral BID   ??? senna  1 tablet Oral Nightly   ??? sevelamer  800 mg NG tube 3xd Meals   ??? sodium chloride  4 mL Nebulization Q6H       PRN medications: acetaminophen, albuterol, dextrose 5 % and sodium chloride  0.9 %, heparin (porcine), heparin (porcine), hydrALAZINE, ondansetron, oxyCODONE      Data/Imaging Review: Reviewed in Epic and personally interpreted on 06/20/2018. See EMR for detailed results.      Critical Care Attestation     This patient is critically ill or injured with the impairment of vital organ systems such that there is a high probability of imminent or life threatening deterioration in the patient's condition. This patient must remain in the ICU for ongoing evaluation of the comprehensive management plan outlined in this note. I directly provided critical care services as documented in this note and the critical care time spent (45 min) is exclusive of separately billable procedures.    Vibha Ferdig Paulino Door, PA

## 2018-06-20 NOTE — Unmapped (Signed)
Overnight pt become very anxious and did not want to wear bipap.  Tachypnea in the 50's. Switched to HFNC and morphine given. Pt still tachypnic to 50's. Bipap reapplied and one more dose of morphine given w/ good effect.  HR ST, BP wnl. 1 BM overnight. Anuric. Turned q2hrs. Will ctm.  Problem: Adult Inpatient Plan of Care  Goal: Plan of Care Review  Outcome: Ongoing - Unchanged  Goal: Patient-Specific Goal (Individualization)  Outcome: Ongoing - Unchanged  Goal: Absence of Hospital-Acquired Illness or Injury  Outcome: Ongoing - Unchanged  Goal: Optimal Comfort and Wellbeing  Outcome: Ongoing - Unchanged  Goal: Readiness for Transition of Care  Outcome: Ongoing - Unchanged  Goal: Rounds/Family Conference  Outcome: Ongoing - Unchanged     Problem: Latex Allergy  Goal: Absence of Allergy Symptoms  Outcome: Ongoing - Unchanged     Problem: Skin Injury Risk Increased  Goal: Skin Health and Integrity  Outcome: Ongoing - Unchanged     Problem: Fall Injury Risk  Goal: Absence of Fall and Fall-Related Injury  Outcome: Ongoing - Unchanged     Problem: Self-Care Deficit  Goal: Improved Ability to Complete Activities of Daily Living  Outcome: Ongoing - Unchanged     Problem: Hypertension Comorbidity  Goal: Blood Pressure in Desired Range  Outcome: Ongoing - Unchanged

## 2018-06-20 NOTE — Unmapped (Signed)
HEMODIALYSIS INTRA-PROCEDURE NOTE    Patient Katherine Norton was seen and examined on hemodialysis June 19, 2018.    CHIEF COMPLAINT:  Acute Kidney Disease    INTERVAL HISTORY:   +high flow nasal cannula. denies orthopnea or shortness of breath. no acute events. urine output 380 ml.     PHYSICAL EXAM:  Vitals:  Temp:  [36.9 ??C (98.4 ??F)-37.2 ??C (99 ??F)] 37.2 ??C (99 ??F)  Heart Rate:  [88-112] 112  MAP:  [91 mmHg-117 mmHg] 112 mmHg  A BP-2: (137-176)/(67-86) 176/78  MAP:  [91 mmHg-117 mmHg] 112 mmHg    Weights:  Admission Weight: 92.5 kg (203 lb 14.8 oz)  Last documented Weight: 89.7 kg (197 lb 12 oz)  Weight Change from Previous Day: No weight listed for specified days    Assessment:  General: appearing chr ill and fatigued  Pulmonary: decr breath sounds  Cardiovascular: regular rate and rhythm  Extremities:  1+ edema     ACCESS:  Hemodialysis Catheter 06/14/18 Left Internal jugular (Active)   Site Assessment Clean 06/19/2018  2:40 PM   Status Deaccessed 06/19/2018  2:40 PM   Dressing Intervention New dressing 06/14/2018  6:20 PM   Dressing Status      Intact/not removed 06/19/2018  2:40 PM   Verification by X-ray Yes 06/16/2018  4:00 PM   Site Condition No complications 06/19/2018  2:40 PM   Dressing Type Occlusive;Transparent 06/19/2018  2:40 PM     LAB DATA:  Lab Results   Component Value Date    NA 137 06/19/2018    K 4.4 06/19/2018    CL 105 06/19/2018    CO2 25.0 06/19/2018    BUN 20 06/19/2018    CREATININE 2.64 (H) 06/19/2018    CALCIUM 8.5 06/19/2018    PHOS 4.8 (H) 06/19/2018    ALBUMIN 2.8 (L) 06/19/2018     Lab Results   Component Value Date    HGB 8.0 (L) 06/19/2018    HCT 25.0 (L) 06/19/2018    PLT 139 (L) 06/19/2018     PLAN:  ultrafiltration goal:  2.5 L

## 2018-06-20 NOTE — Unmapped (Signed)
Patient continues to use NPPV with good compliance. Pt is now on CPAP

## 2018-06-21 DIAGNOSIS — J9601 Acute respiratory failure with hypoxia: Principal | ICD-10-CM

## 2018-06-21 LAB — COMPREHENSIVE METABOLIC PANEL
ALBUMIN: 2.5 g/dL — ABNORMAL LOW (ref 3.5–5.0)
ALKALINE PHOSPHATASE: 98 U/L (ref 38–126)
ALT (SGPT): 16 U/L (ref 15–48)
ANION GAP: 3 mmol/L — ABNORMAL LOW (ref 9–15)
AST (SGOT): 26 U/L (ref 14–38)
BILIRUBIN TOTAL: 0.8 mg/dL (ref 0.0–1.2)
BLOOD UREA NITROGEN: 13 mg/dL (ref 7–21)
BUN / CREAT RATIO: 4
CALCIUM: 7.9 mg/dL — ABNORMAL LOW (ref 8.5–10.2)
CHLORIDE: 105 mmol/L (ref 98–107)
CO2: 26 mmol/L (ref 22.0–30.0)
CREATININE: 2.9 mg/dL — ABNORMAL HIGH (ref 0.60–1.00)
EGFR CKD-EPI NON-AA FEMALE: 17 mL/min/{1.73_m2} — ABNORMAL LOW (ref >=60)
GLUCOSE RANDOM: 103 mg/dL (ref 65–179)
POTASSIUM: 3.6 mmol/L (ref 3.5–5.0)
PROTEIN TOTAL: 6.7 g/dL (ref 6.5–8.3)
SODIUM: 134 mmol/L — ABNORMAL LOW (ref 135–145)

## 2018-06-21 LAB — PHOSPHORUS: Phosphate:MCnc:Pt:Ser/Plas:Qn:: 3.8

## 2018-06-21 LAB — CBC
HEMATOCRIT: 23.7 % — ABNORMAL LOW (ref 36.0–46.0)
HEMOGLOBIN: 7.6 g/dL — ABNORMAL LOW (ref 12.0–16.0)
MEAN CORPUSCULAR HEMOGLOBIN CONC: 32.1 g/dL (ref 31.0–37.0)
MEAN CORPUSCULAR HEMOGLOBIN: 30.9 pg (ref 26.0–34.0)
MEAN CORPUSCULAR VOLUME: 96.2 fL (ref 80.0–100.0)
MEAN PLATELET VOLUME: 10.9 fL — ABNORMAL HIGH (ref 7.0–10.0)
PLATELET COUNT: 144 10*9/L — ABNORMAL LOW (ref 150–440)
RED BLOOD CELL COUNT: 2.47 10*12/L — ABNORMAL LOW (ref 4.00–5.20)

## 2018-06-21 LAB — CYCLOSPORINE (FPIA) BLOOD: Lab: 63

## 2018-06-21 LAB — BLOOD UREA NITROGEN: Urea nitrogen:MCnc:Pt:Ser/Plas:Qn:: 13

## 2018-06-21 LAB — BLOOD GAS, ARTERIAL
BASE EXCESS ARTERIAL: -1 (ref -2.0–2.0)
HCO3 ARTERIAL: 24 mmol/L (ref 22–27)
O2 SATURATION ARTERIAL: 98.5 % (ref 94.0–100.0)
PCO2 ARTERIAL: 42 mmHg (ref 35.0–45.0)
PO2 ARTERIAL: 112 mmHg — ABNORMAL HIGH (ref 80.0–110.0)

## 2018-06-21 LAB — APTT
APTT: 31.9 s (ref 25.9–39.5)
Coagulation surface induced:Time:Pt:PPP:Qn:Coag: 31.9

## 2018-06-21 LAB — PROTIME: Lab: 14.4 — ABNORMAL HIGH

## 2018-06-21 LAB — PROTIME-INR: INR: 1.26

## 2018-06-21 LAB — MEAN CORPUSCULAR HEMOGLOBIN CONC: Lab: 32.1

## 2018-06-21 LAB — BASE EXCESS ARTERIAL: Base excess:SCnc:Pt:BldA:Qn:Calculated: -1

## 2018-06-21 LAB — MAGNESIUM: Magnesium:MCnc:Pt:Ser/Plas:Qn:: 1.8

## 2018-06-21 NOTE — Unmapped (Signed)
Otolaryngology Consult Note      Requesting Attending Physician:  Baxter Kail, MD  Service Requesting Consult:  Medical ICU (MDI)      History of Present Illness:       Katherine Norton is seen in consultation at the request of Baxter Kail, MD for tracheostomy evaluation. She is a 62 yo female with past medical history significant for HTN, cirrhosis s/p previous liver transplant in 1999, CKD, and previous tracheostomy who presented to [facility] as a transfer from [facility]. She initially presented to OSH on 05/20/18 due to worsening shortness of breath and altered mental status. She required CPAP but was found to have severe acidemia on ABG as well as a Cr of 3.89. CXR showed consolidation with concern for pneumonia. She was started on IV antibiotics and then intubated on 05/24/18 for worsening respiratory status and requiring intermittent vasopressors. She underwent bronch on 8/4 and 8/5 for significant mucus plugging. All of her cultures were found to be negative. She was transferred to [facility] for possible perforated duodenal ulcer on a CT abd/pelvis/chest and admitted to the SICU. She was re-scanned and found to have no evidence of perforation. She was then transferred to MICU. She was extubated on 8/12 but had progressive hypoxia and increased work of breathing requiring reintubation on 8/13. This was a difficult intubation, requiring 3 attempts and success with final attempt with bougie. She had a PEA arrest immediately following the final attempt at intubation. She required 1 amp epi and bicarb, and 1 found of CPR with ROSC about 1 min into compressions. Esophageal balloon evaluation at that time showed optimal PEEP 14. She was maintained on this until she was untimately extubated again on 8/25. She required intermittent HFNC and BiPAP until she was reintubated again on 8/28 after CXR showed right sided mucus plug and right lung collapse. During the time that she was extubated, she stated that she would like trach/PEG if needed.    In addition to her respiratory issues, the patient has had an AKI, requiring CRRT now transitioned to [facility]. Her most recent Cr is 2.9. Per notes, her baseline Cr is 1.9-2.2. She is now off all antibiotics for her presumed sepsis secondary to her pneumonia and has remained afebrile. All cultures have been negative.     She is intubated at this time but not sedated. She is agreeable to the tracheostomy as she has had one previously. Her current vent settings are VC/AC with FiO2 of 35% and PEEP of 5, which has been weaned over the past few days.      The patient denies fevers, chills, shortness of breath, chest pain, nausea, vomiting, diarrhea, inability to lie flat, dysphagia, odynophagia, hemoptysis, hematemesis, changes in vision, changes in voice quality, otalgia, otorrhea, vertiginous symptoms, focal deficits, or other concerning symptoms.      Past Medical History     has a past medical history of Chronic kidney disease, Cirrhosis (CMS-HCC), and HTN (hypertension).    Past Surgical History     has a past surgical history that includes Liver transplantation (1996); Hysterectomy; Tonsillectomy; and Bronchoscopy (06/03/2018).    Family History    family history includes Hypertension in her father and mother; Lupus in her brother and sister; Prostate cancer in her father.    Social History:    Tobacco use:   Social History     Tobacco Use   Smoking Status Never Smoker   Smokeless Tobacco Never Used  Alcohol use:   Social History     Substance and Sexual Activity   Alcohol Use No   ??? Alcohol/week: 0.0 standard drinks     Drug use:   Social History     Substance and Sexual Activity   Drug Use No       Allergies  Allergies   Allergen Reactions   ??? Iodinated Contrast Media    ??? Ioxaglate Sodium    ??? Latex        Medications        Current Facility-Administered Medications:   ???  acetaminophen (TYLENOL) tablet 650 mg, 650 mg, NG tube, Q6H PRN, Amy Ross Vota, PA  ???  albuterol nebulizer solution 2.5 mg, 2.5 mg, Nebulization, Q6H PRN, Amy Ross Vota, PA, 2.5 mg at 06/21/18 0301  ???  calcitonin (salmon) (FORTICAL/MIACALCIN) nasal spray 1 spray, 1 spray, Alternating Nares, Daily (RT), Ruta Hinds, MD, 1 spray at 06/21/18 0859  ???  chlorhexidine (PERIDEX) 0.12 % solution 5 mL, 5 mL, Mouth, BID, Amy Ross Vota, PA, 5 mL at 06/21/18 0826  ???  cycloSPORINE (NEORAL) oral microemulsion solution, 75 mg, NG tube, BID, Honey Monet Gildardo Cranker, ACNP, 75 mg at 06/21/18 0859  ???  dextrose (D10W) 10% bolus 125 mL, 12.5 g, Intravenous, Q30 Min PRN, Amy Ross Vota, [place], Last Rate: 250 mL/hr at 06/20/18 1256, 125 mL at 06/20/18 1256  ???  dextrose 5 % and sodium chloride 0.9 % infusion, 25 mL/hr, Intravenous, Continuous PRN, Honey Monet Gildardo Cranker, ACNP, Last Rate: 25 mL/hr at 06/21/18 1000, 25 mL/hr at 06/21/18 1000  ???  famotidine (PF) (PEPCID) injection 20 mg, 20 mg, Intravenous, Daily, Honey Monet Jetter Jones, ACNP, 20 mg at 06/21/18 0920  ???  heparin (porcine) 1000 unit/mL injection 1,500 Units, 1.5 mL, Intra-cannular, Each time in dialysis PRN, Griffin Dakin, MD, 1,500 Units at 06/19/18 1440  ???  heparin (porcine) 1000 unit/mL injection 1,700 Units, 1.7 mL, Intra-cannular, Each time in dialysis PRN, Griffin Dakin, MD, 1,700 Units at 06/19/18 1440  ???  heparin (porcine) injection 5,000 Units, 5,000 Units, Subcutaneous, Q8H SCH, Amy Ross Vota, [place], 5,000 Units at 06/21/18 0530  ???  insulin lispro (HumaLOG) injection 0-12 Units, 0-12 Units, Subcutaneous, Q6H SCH, Baxter Kail, MD  ???  lidocaine (LIDODERM) 5 % patch 1 patch, 1 patch, Transdermal, Daily, Honey Monet Gildardo Cranker, ACNP, 1 patch at 06/21/18 0859  ???  MORPhine 4 mg/mL injection 2 mg, 2 mg, Intravenous, Q4H PRN, Baxter Kail, MD, 2 mg at 06/21/18 0931  ???  ondansetron (ZOFRAN) injection 4 mg, 4 mg, Intravenous, Q6H PRN, Ellin Mayhew, MD, 4 mg at 06/18/18 0957  ???  oxyCODONE (ROXICODONE) immediate release tablet 5 mg, 5 mg, NG tube, Q6H PRN, Amy Ladon Applebaum, PA  ???  polyethylene glycol (MIRALAX) packet 17 g, 17 g, Oral, BID, Amy Ross Vota, PA, 17 g at 06/21/18 0920  ???  senna (SENOKOT) tablet 1 tablet, 1 tablet, Oral, Nightly, Amy Ladon Applebaum, [place], 1 tablet at 06/20/18 2103  ???  sevelamer (RENAGEL) oral solution, 800 mg, NG tube, 3xd Meals, Amy [employer], PA, 800 mg at 06/21/18 0859  ???  sodium chloride 3 % nebulizer solution 4 mL, 4 mL, Nebulization, TID (RT), Baxter Kail, MD, 4 mL at 06/21/18 0825    Review of Systems  Review of Systems:  As above and, otherwise the balance of 11 systems was negative.    Objective:     Vital  Signs  Temp:  [37.1 ??C-37.9 ??C] 37.9 ??C  Heart Rate:  [90-115] 100  SpO2 Pulse:  [90-115] 100  Resp:  [19-33] 23  BP: (108-120)/(66-80) 108/66  MAP (mmHg):  [74-93] 74  A BP-2: (82-149)/(56-81) 115/60  MAP:  [64 mmHg-105 mmHg] 78 mmHg  FiO2 (%):  [30 %-40 %] 35 %  SpO2:  [97 %-100 %] 98 %  Patient Vitals for the past 8 hrs:   Temp Temp src Pulse SpO2 Pulse Resp SpO2   06/21/18 1000 ??? ??? 100 100 23 98 %   06/21/18 0900 ??? ??? 102 102 22 100 %   06/21/18 0800 37.9 ??C Axillary ??? 109 ??? ???   06/21/18 0700 ??? ??? 103 103 23 99 %   06/21/18 0600 ??? ??? 105 104 22 98 %   06/21/18 0500 ??? ??? 107 106 22 98 %   06/21/18 0400 37.2 ??C Oral 113 114 25 99 %   06/21/18 0350 ??? ??? 114 114 24 98 %       Physical Exam  Constitutional:  Vitals reviewed on nursing chart, intubated  Respiration:  Mechanical ventilation  CV: No clubbing/cyanosis/edema in hands.   Eyes:  EOM Intact, sclera normal.   Neuro:  Responsive to questions, unable to assess full cranial nerve status  Head and Face:  Skin with no masses or lesions  Ears:  Normal hearing to whispered voice.   Nose:  External nose midline, anterior rhinoscopy is normal with limited visualization just to the anterior interior turbinate.   Oral Cavity/Oropharynx/Lips: orally intubated.  Neck/Lymph:  No LAD, no thyroid masses. Palpable laryngeal landmarks. Bilateral central lines.      Test Results    All lab results last 24 hours:    Recent Results (from the past 24 hour(s))   Blood Gas, Arterial Specify Site: Arterial; FIO2 Arterial: Not Specified    Collection Time: 06/20/18 11:39 AM   Result Value Ref Range    Specimen Source Arterial     FIO2 Arterial Not Specified     pH, Arterial 7.31 (L) 7.35 - 7.45    pO2, Arterial 147.0 (H) 80.0 - 110.0 mm Hg    pCO2, Arterial 41.8 35.0 - 45.0 mm Hg    HCO3 (Bicarbonate), Arterial 21 (L) 22 - 27 mmol/L    Base Excess, Arterial -4.5 (L) -2.0 - 2.0    O2 Sat, Arterial 99.0 94.0 - 100.0 %   POCT Glucose    Collection Time: 06/20/18 12:04 PM   Result Value Ref Range    Glucose, POC 60 (L) 65 - 179 mg/dL   POCT Glucose    Collection Time: 06/20/18 12:50 PM   Result Value Ref Range    Glucose, POC 49 (L) 65 - 179 mg/dL   POCT Glucose    Collection Time: 06/20/18 12:55 PM   Result Value Ref Range    Glucose, POC 63 (L) 65 - 179 mg/dL   POCT Glucose    Collection Time: 06/20/18  2:11 PM   Result Value Ref Range    Glucose, POC 85 65 - 179 mg/dL   Blood Gas, Arterial Specify Site: Arterial; FIO2 Arterial: 35%    Collection Time: 06/20/18  5:11 PM   Result Value Ref Range    Specimen Source Arterial     FIO2 Arterial 35%     pH, Arterial 7.36 7.35 - 7.45    pO2, Arterial 104.0 80.0 - 110.0 mm Hg    pCO2, Arterial 40.8 35.0 -  45.0 mm Hg    HCO3 (Bicarbonate), Arterial 23 22 - 27 mmol/L    Base Excess, Arterial -1.9 -2.0 - 2.0    O2 Sat, Arterial 98.2 94.0 - 100.0 %   POCT Glucose    Collection Time: 06/20/18  5:42 PM   Result Value Ref Range    Glucose, POC 87 65 - 179 mg/dL   ECG 12 Lead    Collection Time: 06/20/18 10:14 PM   Result Value Ref Range    EKG Systolic BP  mmHg    EKG Diastolic BP  mmHg    EKG Ventricular Rate 98 BPM    EKG Atrial Rate 98 BPM    EKG P-R Interval 178 ms    EKG QRS Duration 122 ms    EKG Q-T Interval 416 ms    EKG QTC Calculation 531 ms    EKG Calculated P Axis 29 degrees    EKG Calculated R Axis 2 degrees    EKG Calculated T Axis 10 degrees QTC Fredericia 490 ms   POCT Glucose    Collection Time: 06/21/18 12:35 AM   Result Value Ref Range    Glucose, POC 97 65 - 179 mg/dL   APTT    Collection Time: 06/21/18  3:26 AM   Result Value Ref Range    APTT 31.9 25.9 - 39.5 sec    Heparin Correlation 0.2    CBC    Collection Time: 06/21/18  3:26 AM   Result Value Ref Range    WBC 2.2 (L) 4.5 - 11.0 10*9/L    RBC 2.47 (L) 4.00 - 5.20 10*12/L    HGB 7.6 (L) 12.0 - 16.0 g/dL    HCT 16.1 (L) 09.6 - 46.0 %    MCV 96.2 80.0 - 100.0 fL    MCH 30.9 26.0 - 34.0 pg    MCHC 32.1 31.0 - 37.0 g/dL    RDW 04.5 (H) 40.9 - 15.0 %    MPV 10.9 (H) 7.0 - 10.0 fL    Platelet 144 (L) 150 - 440 10*9/L   PT-INR    Collection Time: 06/21/18  3:26 AM   Result Value Ref Range    PT 14.4 (H) 10.2 - 12.8 sec    INR 1.26    Comprehensive Metabolic Panel    Collection Time: 06/21/18  3:28 AM   Result Value Ref Range    Sodium 134 (L) 135 - 145 mmol/L    Potassium 3.6 3.5 - 5.0 mmol/L    Chloride 105 98 - 107 mmol/L    CO2 26.0 22.0 - 30.0 mmol/L    BUN 13 7 - 21 mg/dL    Creatinine 8.11 (H) 0.60 - 1.00 mg/dL    BUN/Creatinine Ratio 4     EGFR CKD-EPI Non-African American, Female 17 (L) >=60 mL/min/1.77m2    EGFR CKD-EPI African American, Female 19 (L) >=60 mL/min/1.73m2    Glucose 103 65 - 179 mg/dL    Calcium 7.9 (L) 8.5 - 10.2 mg/dL    Albumin 2.5 (L) 3.5 - 5.0 g/dL    Total Protein 6.7 6.5 - 8.3 g/dL    Total Bilirubin 0.8 0.0 - 1.2 mg/dL    AST 26 14 - 38 U/L    ALT 16 15 - 48 U/L    Alkaline Phosphatase 98 38 - 126 U/L    Anion Gap 3 (L) 9 - 15 mmol/L   Magnesium Level    Collection Time: 06/21/18  3:28 AM   Result  Value Ref Range    Magnesium 1.8 1.6 - 2.2 mg/dL   Phosphorus Level    Collection Time: 06/21/18  3:28 AM   Result Value Ref Range    Phosphorus 3.8 2.9 - 4.7 mg/dL   Blood Gas, Arterial Specify Site: Arterial; FIO2 Arterial: 35%    Collection Time: 06/21/18  3:28 AM   Result Value Ref Range    Specimen Source Arterial     FIO2 Arterial 35%     pH, Arterial 7.37 7.35 - 7.45    pO2, Arterial 112.0 (H) 80.0 - 110.0 mm Hg    pCO2, Arterial 42.0 35.0 - 45.0 mm Hg    HCO3 (Bicarbonate), Arterial 24 22 - 27 mmol/L    Base Excess, Arterial -1.0 -2.0 - 2.0    O2 Sat, Arterial 98.5 94.0 - 100.0 %   POCT Glucose    Collection Time: 06/21/18  5:28 AM   Result Value Ref Range    Glucose, POC 112 65 - 179 mg/dL   Cyclosporine Level, Timed    Collection Time: 06/21/18  5:43 AM   Result Value Ref Range    Cyclosporine, Timed 63 ng/mL       Imaging:  CXR 06/20/18:  CONCLUSIONS:    Interval intubation with tip 2.3 cm above the carina.     Interval reexpansion of the right lung.    No other same-day changes.      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 ENT has been consulted for tracheostomy evaluation. Per the primary team, her PEEP requirement has decreased and is now at 5. They do not expect that her PEEP requirement will increase. She is a candidate for tracheostomy at this time.    - We will schedule for tracheostomy tomorrow 06/22/18. Risks, benefits, and alternatives were discussed with the patient's daughter, who has signed the consent form.  - Please hold tube feeds at midnight.  - Please hold AM dose of anticoagulation tomorrow.    - Please page the ENT consult pager for further questions or concerns [phone])

## 2018-06-21 NOTE — Unmapped (Signed)
Pt intubated this morning, pt reamians of VC with Fio2 at 35% RR 22 PEEP 10, Vt 300, no desats. Pt started on propofol and norepinephrine after intubation, gtts now off, pt able to follow commands, RASS -1 . Pt had two episodes on BS in 60s, PRN  dextrose given started on tube feeds currently at 20. Pt had one BM tofay. Pt trunedq2hous. Will continue to monitor.   Problem: Adult Inpatient Plan of Care  Goal: Plan of Care Review  Outcome: Ongoing - Unchanged  Goal: Patient-Specific Goal (Individualization)  Outcome: Ongoing - Unchanged  Goal: Absence of Hospital-Acquired Illness or Injury  Outcome: Ongoing - Unchanged  Goal: Optimal Comfort and Wellbeing  Outcome: Ongoing - Unchanged  Goal: Readiness for Transition of Care  Outcome: Ongoing - Unchanged  Goal: Rounds/Family Conference  Outcome: Ongoing - Unchanged     Problem: Latex Allergy  Goal: Absence of Allergy Symptoms  Outcome: Ongoing - Unchanged     Problem: Skin Injury Risk Increased  Goal: Skin Health and Integrity  Outcome: Ongoing - Unchanged     Problem: Fall Injury Risk  Goal: Absence of Fall and Fall-Related Injury  Outcome: Ongoing - Unchanged     Problem: Self-Care Deficit  Goal: Improved Ability to Complete Activities of Daily Living  Outcome: Ongoing - Unchanged     Problem: Hypertension Comorbidity  Goal: Blood Pressure in Desired Range  Outcome: Ongoing - Unchanged     Problem: Pain Chronic (Persistent) (Comorbidity Management)  Goal: Acceptable Pain Control and Functional Ability  Outcome: Ongoing - Unchanged     Problem: Fluid Imbalance (Pneumonia)  Goal: Fluid Balance  Outcome: Ongoing - Unchanged     Problem: Infection (Pneumonia)  Goal: Resolution of Infection Signs/Symptoms  Outcome: Ongoing - Unchanged     Problem: Respiratory Compromise (Pneumonia)  Goal: Effective Oxygenation and Ventilation  Outcome: Ongoing - Unchanged     Problem: Device-Related Complication Risk (Hemodialysis)  Goal: Safe, Effective Therapy Delivery  Outcome: Ongoing - Unchanged     Problem: Hemodynamic Instability (Hemodialysis)  Goal: Vital Signs Remain in Desired Range  Outcome: Ongoing - Unchanged     Problem: Infection (Hemodialysis)  Goal: Absence of Infection Signs/Symptoms  Outcome: Ongoing - Unchanged     Problem: Communication Impairment (Mechanical Ventilation, Invasive)  Goal: Effective Communication  Outcome: Ongoing - Unchanged     Problem: Noninvasive Ventilation Acute  Goal: Effective Unassisted Ventilation and Oxygenation  Outcome: Ongoing - Unchanged

## 2018-06-21 NOTE — Unmapped (Signed)
Pt remained on same settings overnight, pt received treatment via metaneb overnight, suctioned for thick,tan secretions. NAD noted at this time. Pt following commands, plan for trach, ENT consulted per MD

## 2018-06-21 NOTE — Unmapped (Signed)
Patient remains in the MICU,intubated/ventilated. Vent settings as documented. Patient suctioned as necessary. Oral care provided. Patient received respiratory treatments/airway clearance as documented.

## 2018-06-21 NOTE — Unmapped (Signed)
Patient updated on plan of care for shift. Patient FC and attempts to communicate by mouthing words. See flowsheet for VS, vent settings, I and O's, labs. Patient anuric. Bed locked in low postion, side rails elevated x 4, no falls. Will continue to monitor.    Problem: Adult Inpatient Plan of Care  Goal: Plan of Care Review  Outcome: Progressing  Goal: Patient-Specific Goal (Individualization)  Outcome: Progressing  Goal: Absence of Hospital-Acquired Illness or Injury  Outcome: Progressing  Goal: Optimal Comfort and Wellbeing  Outcome: Progressing  Goal: Readiness for Transition of Care  Outcome: Progressing  Goal: Rounds/Family Conference  Outcome: Progressing     Problem: Latex Allergy  Goal: Absence of Allergy Symptoms  Outcome: Progressing     Problem: Skin Injury Risk Increased  Goal: Skin Health and Integrity  Outcome: Progressing     Problem: Fall Injury Risk  Goal: Absence of Fall and Fall-Related Injury  Outcome: Progressing     Problem: Self-Care Deficit  Goal: Improved Ability to Complete Activities of Daily Living  Outcome: Progressing     Problem: Hypertension Comorbidity  Goal: Blood Pressure in Desired Range  Outcome: Progressing     Problem: Pain Chronic (Persistent) (Comorbidity Management)  Goal: Acceptable Pain Control and Functional Ability  Outcome: Progressing     Problem: Fluid Imbalance (Pneumonia)  Goal: Fluid Balance  Outcome: Progressing     Problem: Infection (Pneumonia)  Goal: Resolution of Infection Signs/Symptoms  Outcome: Progressing     Problem: Respiratory Compromise (Pneumonia)  Goal: Effective Oxygenation and Ventilation  Outcome: Progressing     Problem: Device-Related Complication Risk (Hemodialysis)  Goal: Safe, Effective Therapy Delivery  Outcome: Progressing     Problem: Hemodynamic Instability (Hemodialysis)  Goal: Vital Signs Remain in Desired Range  Outcome: Progressing     Problem: Infection (Hemodialysis)  Goal: Absence of Infection Signs/Symptoms  Outcome: Progressing Problem: Communication Impairment (Mechanical Ventilation, Invasive)  Goal: Effective Communication  Outcome: Progressing     Problem: Noninvasive Ventilation Acute  Goal: Effective Unassisted Ventilation and Oxygenation  Outcome: Progressing

## 2018-06-21 NOTE — Unmapped (Signed)
Patient intubated and placed on ventilator.  All scheduled inhaled medications and airway clearance given.  Patient PEEP weaned to 8, sats remained 98-100%.  Patient currently in no distress, RT will continue to monitor.

## 2018-06-21 NOTE — Unmapped (Signed)
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 female with  PMHx of liver transplant (1999) on immunosuppresion, chronic back pain, anxiety, HTN, CKD,  that presents to [facility] as a transfer from [facility] on 8/10, with respiratory failure and possible contained duodenal perforation. She was transferred to MICU the following day for vent mngmt since there wasn't any evid of perf per gen surg.     24hr events:  reintubated yesterday morning.  Fi02 and PEEP weaned down.      Dispo: ICU    Neurological   #acute encephalopathy: resolved    #acute on chronic pain, sedation  -cont lidoderm patch to back and tylenol prn to minimize narc  -  oxy to 5 q 6 PRN. + PRN morphine for breakthrough    Pulmonary   #acute hypoxic respiratory failure likely due to multifocal PNA, ARDS.  Intubated on 8/1 at OSH CT chest with Scattered airspace opacities in the left apex, left upper lobe and superior segment of left lower lobe; concerning for pneumonia.  Extubated 8/12 and reint 8/13 for progressive hypoxia and inc WOB .  Extubated 8/25.  Reintubated on 8/28 am after R sided plug/collpase.    - pt and family amenable to trach/peg   - optimal PEEP 14 per esophogeal balloon eval (8/14) .  Consulted ENT, but unable to trach until PEEP < 10  And PEEP down to 5 this am.  ENT re-called.     - aggressive pulm toilet as tol  - hypertonic nebs    Cardiovascular   #acute HFrEF  #h/o hypertension   - hold home metoprolol and amlodipine   - prn hydralazine  - Echo 7/29: EF 65-70%, mod pulm HTN and borderline dilated RV   - repeat echo 8/16 EF >55%, severe RV fx and dilated RA, severe phtn, mod TR    #Hypotension, resolved:  - developed pressor requirement 8/15  - previously on norepi gtt for MAP>65, currently off since 8/22    Renal   #Acute on chronic renal failure  - Cr baseline appears to be ~1.7--> 3-->2.87  - nephrology consulted 8/16, appreciate recs >> CRRT started 8/22, clotted off 8/25. First iHD on 8/27 with 2.5 L removed.  Tolerated well.    - foley removed 8/27  - Plan iHD today 8/29    Infectious Disease/Autoimmune   #septic shock due to ? PNA.  Initially seen at OSH and had c/f perforated duodenal ulcer, but CT scan here non concerning.  CT chest with possible multifocal PNA.    - appreciate ID's assistance. ID signed off 8/17  - WOCN consulted for intertrigo and sacral wound (see recs 8/19)  - last Blood/urine/sputum cxs sent 8/21, abx given x 1 dose    Abx:   Cefepime (8/22- 8/22)  Fluconazole (8/22- 8/22)  Vanco (8/11-->8/12)  Mica (8/11-->8/12)  Flagyl (8/10-->8/14)  Cefepime  (8/10-->8/16)  Voriconazole (8/12-->8/15)  Linezolid (8/15-8/16)    Abx: OSH  ceftaroline and flagyl  levaquin  meropenem     Cx OSH:  - 7/29 Strep pneumo Ag neg, RVP neg, urine Legionella Ag neg  - 7/29 bd cx NGTD  OSH: Fungal, pneumocystis/histoplasma, AFB, Gram on BAL all were negative.    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  Per surgery, NO perf.      #abd pain:   - pt endorsed abd pain, prior to reintubation >> cont to have tenderness on palpation  - lipase elevated on 8/20, f/u in am    #ileus  - n/v noted overnight 8/25, zofran given, TF held  - KUB reveals dilated loops of bowel concerning for ileus  - cont NG to LIWS and hold TF >> Bowel regimen, with + BM   - resumed TF on 8/28, cont to increase to goal.      Malnutrition Assessment: Not done yet.  Patient does not meet AND/ASPEN criteria for malnutrition at this time (06/13/18 1522)  - FMS for liquid stools, output reduced >> consider remove today  - consider cdiff if spikes temp or new leukocytosis    Heme/Coag   #Critical illness anemia  - Hgb drop to 5.8 at OSH with transfusion  - last transfusion 2 units PRBC 8/20-8/21 and 1unit 8/26, no s/s of bleeding  - plts, coags acceptable  - SCDs  and heparin for DVT ppx  Endocrine   #Hypoglycemia  - cont accu check while TF on hold  - glucose within target range no  - cont dextrose 5NS at 61ml/hr until TF at goal and not hypoglycemic    Prophylaxis/LDA/Restraints/Consults     CHECKLIST   Can CVC be removed? No: dialysis catheter   Can A-line be removed? no  Can Foley be removed? removed  Mobility plan: Step 1 - Range of motion    Feeding: adv to goal  Analgesia: Pain adequately controlled  Sedation SAT/SBT: N/A  Thrombembolic ppx: SQ heparin  Head of bed >30 degrees: Yes  Ulcer ppx: Yes, home use continued  Glucose within target range: Not in range, titrating medications    RASS at goal? Yes  [place] Agitation Assessment Scale (RASS) : -1 (06/21/2018  6:00 AM)     Can antipsychotics be stopped? N/A, not on antipsychotics  CAM-ICU Result: Positive (06/20/2018  8:00 PM)      Patient Lines/Drains/Airways Status    Active Active Lines, Drains, & Airways     Name:   Placement date:   Placement time:   Site:   Days:    ETT  7.5   06/20/18    0914     less than 1    CVC Triple Lumen 06/03/18 Non-tunneled Right Internal jugular   06/03/18    1503    Internal jugular   17    NG/OG Tube Right nostril   06/02/18    2121    Right nostril   18    Arterial Line 06/03/18 Right Radial   06/03/18    0007    Radial   18    Hemodialysis Catheter 06/14/18 Left Internal jugular   06/14/18    1400  Internal jugular   6              Patient Lines/Drains/Airways Status    Active Wounds     Name:   Placement date:   Placement time:   Site:   Days:    Wound 06/08/18 Pressure Injury Buttocks Posterior pink Stage 2   06/08/18    0001    Buttocks   13    Wound 06/09/18 Rash/Dermititis Perineum   06/09/18    2154    Perineum   11    Wound 06/18/18 Abdomen Lower   06/18/18    1102    Abdomen   2    Wound 06/18/18 Rash/Dermititis Breast Left;Lower   06/18/18    1108    Breast   2                Goals of Care     Code Status: Full Code    Designated Healthcare Decision Maker:  Katherine Norton currently lacks decisional capacity for healthcare decision-making and is unable to designate a surrogate healthcare decision maker. Katherine Norton designated healthcare decision maker(s) is/are Katherine Norton and Katherine Norton (the patient's adult child) as denoted by hospital policy for patients without a known preference.      Subjective   Katherine Norton is a 62 y.o. female with PMHx of liver transplant (1999) on immunosuppresion, chronic back pain, anxiety, HTN, CKD,  that presents to [facility] as a transfer from [facility]. Patient initially presented to the OSH on 05/20/18 due to acute worsening shortness of breath and altered mental status. Patient was sating 68% on RA, but 90% on CPAP. Patient had severe acidemia on ABG as well as acute on chronic renal failure with Cr of 3.89. CXR at that time showed consolidation with concern for pneumonia and was febrile. Patient's initial lactate was 2.70. Patient was started on IV ceftaroline and flagyl initially.      Objective     Vitals - past 24 hours  Temp:  [37.1 ??C (98.7 ??F)-37.3 ??C (99.1 ??F)] 37.2 ??C (99 ??F)  Heart Rate:  [85-118] 103  SpO2 Pulse:  [90-121] 103  Resp:  [18-62] 23  BP: (108-149)/(66-86) 108/66  FiO2 (%):  [30 %-100 %] 35 %  SpO2:  [95 %-100 %] 99 % Intake/Output  I/O last 3 completed shifts:  In: 1881.8 [I.V.:991.8; NG/GT:640; IV Piggyback:250]  Out: -      Physical Exam: General: obese female, A&O person, place, year, nods y/n   HEENT: PERRLA  CV: RRR, no mrg  Pulm: coarse bs bilaterally  GI: soft, distended, intmt tender on palaption  MSK: no edema  Skin: intact x intertrigo under bilateral breasts, panniculus, and perineal area  Neuro: awake, FC, mae, gen weakness, antigrav BUE, BLE 2/5      Continuous Infusions:   ??? dextrose 5 % and sodium chloride 0.9 % 25 mL/hr (06/21/18 0600)   ??? propofol Stopped (06/20/18 1900)       Scheduled Medications:   ??? calcitonin (salmon)  1 spray Alternating Nares Daily (RT)   ??? chlorhexidine  5 mL Mouth BID   ??? cycloSPORINE  75 mg NG tube BID   ??? famotidine (PEPCID) IV  20 mg Intravenous Daily   ??? heparin (porcine) for subcutaneous use  5,000 Units Subcutaneous [facility]   ??? insulin lispro  0-12 Units Subcutaneous Q6H [facility]   ??? lidocaine  1 patch Transdermal Daily   ??? oxyCODONE  5 mg  NG tube Q6H   ??? polyethylene glycol  17 g Oral BID   ??? senna  1 tablet Oral Nightly   ??? sevelamer  800 mg NG tube 3xd Meals   ??? sodium chloride  4 mL Nebulization Q6H (RT)   ??? sodium chloride  4 mL Nebulization TID (RT)       PRN medications: acetaminophen, albuterol, dextrose, dextrose 5 % and sodium chloride 0.9 %, heparin (porcine), heparin (porcine), hydrALAZINE, MORPhine injection, ondansetron      Data/Imaging Review: Reviewed in Epic and personally interpreted on 06/21/2018. See EMR for detailed results.      Critical Care Attestation     This patient is critically ill or injured with the impairment of vital organ systems such that there is a high probability of imminent or life threatening deterioration in the patient's condition. This patient must remain in the ICU for ongoing evaluation of the comprehensive management plan outlined in this note. I directly provided critical care services as documented in this note and the critical care time spent (30 min) is exclusive of separately billable procedures.    Solstice Lastinger Paulino Door, PA

## 2018-06-22 DIAGNOSIS — J9601 Acute respiratory failure with hypoxia: Principal | ICD-10-CM

## 2018-06-22 LAB — BLOOD GAS CRITICAL CARE PANEL, ARTERIAL
BASE EXCESS ARTERIAL: 2.1 — ABNORMAL HIGH (ref -2.0–2.0)
CALCIUM IONIZED ARTERIAL (MG/DL): 4.46 mg/dL (ref 4.40–5.40)
GLUCOSE WHOLE BLOOD: 81 mg/dL
HEMOGLOBIN BLOOD GAS: 8.6 g/dL — ABNORMAL LOW (ref 12.00–16.00)
LACTATE BLOOD ARTERIAL: 0.7 mmol/L (ref <=1.2)
O2 SATURATION ARTERIAL: 99.5 % (ref 94.0–100.0)
PCO2 ARTERIAL: 42.2 mmHg (ref 35.0–45.0)
POTASSIUM WHOLE BLOOD: 3.6 mmol/L (ref 3.4–4.6)
SODIUM WHOLE BLOOD: 136 mmol/L (ref 135–145)

## 2018-06-22 LAB — PROTIME: Lab: 13.5 — ABNORMAL HIGH

## 2018-06-22 LAB — RED CELL DISTRIBUTION WIDTH: Lab: 16.4 — ABNORMAL HIGH

## 2018-06-22 LAB — CALCIUM IONIZED ARTERIAL (MG/DL): Calcium.ionized:MCnc:Pt:Bld:Qn:: 4.46

## 2018-06-22 LAB — COMPREHENSIVE METABOLIC PANEL
ALBUMIN: 2.6 g/dL — ABNORMAL LOW (ref 3.5–5.0)
ALKALINE PHOSPHATASE: 84 U/L (ref 38–126)
ALT (SGPT): 12 U/L — ABNORMAL LOW (ref 15–48)
ANION GAP: 5 mmol/L — ABNORMAL LOW (ref 9–15)
AST (SGOT): 23 U/L (ref 14–38)
BILIRUBIN TOTAL: 0.8 mg/dL (ref 0.0–1.2)
BLOOD UREA NITROGEN: 8 mg/dL (ref 7–21)
BUN / CREAT RATIO: 4
CALCIUM: 7.9 mg/dL — ABNORMAL LOW (ref 8.5–10.2)
CHLORIDE: 103 mmol/L (ref 98–107)
CREATININE: 2.02 mg/dL — ABNORMAL HIGH (ref 0.60–1.00)
EGFR CKD-EPI AA FEMALE: 30 mL/min/{1.73_m2} — ABNORMAL LOW (ref >=60)
EGFR CKD-EPI NON-AA FEMALE: 26 mL/min/{1.73_m2} — ABNORMAL LOW (ref >=60)
GLUCOSE RANDOM: 83 mg/dL (ref 65–179)
POTASSIUM: 3.8 mmol/L (ref 3.5–5.0)
PROTEIN TOTAL: 7 g/dL (ref 6.5–8.3)
SODIUM: 134 mmol/L — ABNORMAL LOW (ref 135–145)

## 2018-06-22 LAB — BILIRUBIN TOTAL: Bilirubin:MCnc:Pt:Ser/Plas:Qn:: 0.8

## 2018-06-22 LAB — CBC
HEMATOCRIT: 25.5 % — ABNORMAL LOW (ref 36.0–46.0)
MEAN CORPUSCULAR HEMOGLOBIN CONC: 32 g/dL (ref 31.0–37.0)
MEAN CORPUSCULAR VOLUME: 95.2 fL (ref 80.0–100.0)
MEAN PLATELET VOLUME: 9.3 fL (ref 7.0–10.0)
RED BLOOD CELL COUNT: 2.68 10*12/L — ABNORMAL LOW (ref 4.00–5.20)
RED CELL DISTRIBUTION WIDTH: 16.4 % — ABNORMAL HIGH (ref 12.0–15.0)
WBC ADJUSTED: 2.5 10*9/L — ABNORMAL LOW (ref 4.5–11.0)

## 2018-06-22 LAB — HEPARIN CORRELATION: Lab: 0.2

## 2018-06-22 LAB — PROTIME-INR: PROTIME: 13.5 s — ABNORMAL HIGH (ref 10.2–12.8)

## 2018-06-22 LAB — PHOSPHORUS: Phosphate:MCnc:Pt:Ser/Plas:Qn:: 2.6 — ABNORMAL LOW

## 2018-06-22 LAB — MAGNESIUM: Magnesium:MCnc:Pt:Ser/Plas:Qn:: 1.8

## 2018-06-22 NOTE — Unmapped (Signed)
Pt Katherine Norton -1, follows commands, nods/gestures appropriately, afebrile. CAM-ICU positive. Pt remains intubated on volume control, FiO2 at  35% Rr22 TV 300 PEEP 5, no issues, sats >93%. Pt HR/BP stable see VSS. Pt on TF @65 , will stop at midnight for procedure tomorrow. Pt had two bowel movements this shift. Pt will receive 1UPRBC for procedure tomorrow. Pt getting dialysis this afternoon. Will continue to monitor.   Problem: Adult Inpatient Plan of Care  Goal: Plan of Care Review  Outcome: Progressing  Flowsheets (Taken 06/21/2018 1720)  Progress: improving  Plan of Care Reviewed With: patient  Goal: Patient-Specific Goal (Individualization)  Outcome: Progressing  Goal: Absence of Hospital-Acquired Illness or Injury  Outcome: Progressing  Goal: Optimal Comfort and Wellbeing  Outcome: Progressing  Goal: Readiness for Transition of Care  Outcome: Progressing  Goal: Rounds/Family Conference  Outcome: Progressing     Problem: Latex Allergy  Goal: Absence of Allergy Symptoms  Outcome: Progressing     Problem: Skin Injury Risk Increased  Goal: Skin Health and Integrity  Outcome: Progressing     Problem: Fall Injury Risk  Goal: Absence of Fall and Fall-Related Injury  Outcome: Progressing     Problem: Self-Care Deficit  Goal: Improved Ability to Complete Activities of Daily Living  Outcome: Progressing     Problem: Hypertension Comorbidity  Goal: Blood Pressure in Desired Range  Outcome: Progressing     Problem: Pain Chronic (Persistent) (Comorbidity Management)  Goal: Acceptable Pain Control and Functional Ability  Outcome: Progressing     Problem: Fluid Imbalance (Pneumonia)  Goal: Fluid Balance  Outcome: Progressing     Problem: Infection (Pneumonia)  Goal: Resolution of Infection Signs/Symptoms  Outcome: Progressing     Problem: Respiratory Compromise (Pneumonia)  Goal: Effective Oxygenation and Ventilation  Outcome: Progressing     Problem: Device-Related Complication Risk (Hemodialysis)  Goal: Safe, Effective Therapy Delivery  Outcome: Progressing     Problem: Hemodynamic Instability (Hemodialysis)  Goal: Vital Signs Remain in Desired Range  Outcome: Progressing     Problem: Infection (Hemodialysis)  Goal: Absence of Infection Signs/Symptoms  Outcome: Progressing     Problem: Communication Impairment (Mechanical Ventilation, Invasive)  Goal: Effective Communication  Outcome: Progressing     Problem: Noninvasive Ventilation Acute  Goal: Effective Unassisted Ventilation and Oxygenation  Outcome: Progressing

## 2018-06-22 NOTE — Unmapped (Signed)
Patient noted in bed; hob elevated; cont intubated, not sedated; pt cooperative. Call bell at bedside; turned and repositioned; currently npo for procedure this am, keep monitored.

## 2018-06-22 NOTE — Unmapped (Signed)
Pt remains on vent. ETT secure 20 @ the lip. Suctioned for small amounts of thick pluggy secretions. Pt awake and alert. BSS clear and diminished. No changes made this shift. Will continue to monitor.

## 2018-06-22 NOTE — Unmapped (Signed)
Remains vented no sign of distress. Alert,follows commands. No restraints needed. Trach( OR ) cancelled for today next OR will be Tuesday. Family notified . Restarted tube feeds. Continue plan of care .Remains a DNR . No fever.   Problem: Adult Inpatient Plan of Care  Goal: Plan of Care Review  Outcome: Progressing  Goal: Patient-Specific Goal (Individualization)  Outcome: Progressing  Goal: Absence of Hospital-Acquired Illness or Injury  Outcome: Progressing  Goal: Optimal Comfort and Wellbeing  Outcome: Progressing  Goal: Readiness for Transition of Care  Outcome: Progressing  Goal: Rounds/Family Conference  Outcome: Progressing     Problem: Latex Allergy  Goal: Absence of Allergy Symptoms  Outcome: Progressing     Problem: Skin Injury Risk Increased  Goal: Skin Health and Integrity  Outcome: Progressing     Problem: Fall Injury Risk  Goal: Absence of Fall and Fall-Related Injury  Outcome: Progressing     Problem: Self-Care Deficit  Goal: Improved Ability to Complete Activities of Daily Living  Outcome: Progressing     Problem: Hypertension Comorbidity  Goal: Blood Pressure in Desired Range  Outcome: Progressing     Problem: Pain Chronic (Persistent) (Comorbidity Management)  Goal: Acceptable Pain Control and Functional Ability  Outcome: Progressing     Problem: Fluid Imbalance (Pneumonia)  Goal: Fluid Balance  Outcome: Progressing     Problem: Infection (Pneumonia)  Goal: Resolution of Infection Signs/Symptoms  Outcome: Progressing     Problem: Respiratory Compromise (Pneumonia)  Goal: Effective Oxygenation and Ventilation  Outcome: Progressing     Problem: Device-Related Complication Risk (Hemodialysis)  Goal: Safe, Effective Therapy Delivery  Outcome: Progressing     Problem: Hemodynamic Instability (Hemodialysis)  Goal: Vital Signs Remain in Desired Range  Outcome: Progressing     Problem: Infection (Hemodialysis)  Goal: Absence of Infection Signs/Symptoms  Outcome: Progressing     Problem: Communication Impairment (Mechanical Ventilation, Invasive)  Goal: Effective Communication  Outcome: Progressing     Problem: Noninvasive Ventilation Acute  Goal: Effective Unassisted Ventilation and Oxygenation  Outcome: Progressing

## 2018-06-22 NOTE — Unmapped (Signed)
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 female with  PMHx of liver transplant (1999) on immunosuppresion, chronic back pain, anxiety, HTN, CKD,  that presents to [facility] as a transfer from  Hospital on 8/10, with respiratory failure and possible contained duodenal perforation. She was transferred to MICU the following day for vent mngmt since there wasn't any evid of perf per gen surg.     24hr events: No events overnight    Dispo: ICU    Neurological   #acute encephalopathy: resolved    #acute on chronic pain, sedation  -cont lidoderm patch to back and tylenol prn to minimize narc  -  oxy to 5 q 6 PRN. + PRN morphine for breakthrough    Pulmonary   #acute hypoxic respiratory failure likely due to multifocal PNA, ARDS.  Intubated on 8/1 at OSH CT chest with Scattered airspace opacities in the left apex, left upper lobe and superior segment of left lower lobe; concerning for pneumonia.  Extubated 8/12 and reint 8/13 for progressive hypoxia and inc WOB .  Extubated 8/25.  Reintubated on 8/28 am after R sided plug/collpase.    - pt and family amenable to trach/peg   - optimal PEEP 14 per esophogeal balloon eval (8/14) .  Consulted ENT, but unable to trach until PEEP < 10  And PEEP down to 5 this am.  ENT re-called.     - aggressive pulm toilet as tol  - hypertonic nebs  - Awaiting OR today for trach    Cardiovascular   #acute HFrEF  #h/o hypertension   - hold home metoprolol and amlodipine   - prn hydralazine  - Echo 7/29: EF 65-70%, mod pulm HTN and borderline dilated RV   - repeat echo 8/16 EF >55%, severe RV fx and dilated RA, severe phtn, mod TR    #Hypotension, resolved:  - developed pressor requirement 8/15  - previously on norepi gtt for MAP>65, currently off since 8/22    Renal   #Acute on chronic renal failure  - Cr baseline appears to be ~1.7--> 3-->2.87  - nephrology consulted 8/16, appreciate recs >> CRRT started 8/22, clotted off 8/25. First iHD on 8/27 with 2.5 L removed.  Tolerated well.    - foley removed 8/27  - Plan iHD today 8/29    Infectious Disease/Autoimmune   #septic shock due to ? PNA.  Initially seen at OSH and had c/f perforated duodenal ulcer, but CT scan here non concerning.  CT chest with possible multifocal PNA.    - appreciate ID's assistance. ID signed off 8/17  - WOCN consulted for intertrigo and sacral wound (see recs 8/19)  - last Blood/urine/sputum cxs sent 8/21, abx given x 1 dose    Abx:   Cefepime (8/22- 8/22)  Fluconazole (8/22- 8/22)  Vanco (8/11-->8/12)  Mica (8/11-->8/12)  Flagyl (8/10-->8/14)  Cefepime  (8/10-->8/16)  Voriconazole (8/12-->8/15)  Linezolid (8/15-8/16)  Abx: OSH  ceftaroline and flagyl  levaquin  meropenem     Cx OSH:  - 7/29 Strep pneumo Ag neg, RVP neg, urine Legionella Ag neg  - 7/29 bd cx NGTD  OSH: Fungal, pneumocystis/histoplasma, AFB, Gram on BAL all were negative.    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  Per surgery, NO perf.      #abd pain:   - pt endorsed abd pain, prior to reintubation >> cont to have tenderness on palpation  - lipase elevated on 8/20, f/u in am    #ileus  - n/v noted overnight 8/25, zofran given, TF held  - KUB reveals dilated loops of bowel concerning for ileus  - cont NG to LIWS and hold TF >> Bowel regimen, with + BM   - resumed TF on 8/28, cont to increase to goal.      Malnutrition Assessment: Not done yet.  Patient does not meet AND/ASPEN criteria for malnutrition at this time (06/13/18 1522)  - FMS for liquid stools, output reduced >> consider remove today  - consider cdiff if spikes temp or new leukocytosis    Heme/Coag   #Critical illness anemia  - Hgb drop to 5.8 at OSH with transfusion  - last transfusion 2 units PRBC 8/20-8/21 and 1unit 8/26, no s/s of bleeding  - plts, coags acceptable  - SCDs  and heparin for DVT ppx  Endocrine   #Hypoglycemia  - cont accu check while TF on hold  - glucose within target range no  - cont dextrose 5NS at 52ml/hr until TF at goal and not hypoglycemic    Prophylaxis/LDA/Restraints/Consults     CHECKLIST   Can CVC be removed? No: dialysis catheter   Can A-line be removed? no  Can Foley be removed? removed  Mobility plan: Step 1 - Range of motion    Feeding: adv to goal  Analgesia: Pain adequately controlled  Sedation SAT/SBT: N/A  Thrombembolic ppx: SQ heparin  Head of bed >30 degrees: Yes  Ulcer ppx: Yes, home use continued  Glucose within target range: Not in range, titrating medications    RASS at goal? Yes  [place] Agitation Assessment Scale (RASS) : 0 (06/22/2018  8:30 AM)     Can antipsychotics be stopped? N/A, not on antipsychotics  CAM-ICU Result: Positive (06/21/2018  8:00 AM)      Patient Lines/Drains/Airways Status    Active Active Lines, Drains, & Airways     Name:   Placement date:   Placement time:   Site:   Days:    ETT  7.5   06/20/18    0914     2    CVC Triple Lumen 06/03/18 Non-tunneled Right Internal jugular   06/03/18    1503    Internal jugular   18    NG/OG Tube Right nostril   06/02/18    2121    Right nostril   19    Arterial Line 06/03/18 Right Radial   06/03/18    0007    Radial   19    Hemodialysis Catheter 06/14/18 Left Internal jugular   06/14/18    1400    Internal jugular   7 Patient  Lines/Drains/Airways Status    Active Wounds     Name:   Placement date:   Placement time:   Site:   Days:    Wound 06/08/18 Pressure Injury Buttocks Posterior pink Stage 2   06/08/18    0001    Buttocks   14    Wound 06/09/18 Rash/Dermititis Perineum   06/09/18    2154    Perineum   12    Wound 06/18/18 Abdomen Lower   06/18/18    1102    Abdomen   3    Wound 06/18/18 Rash/Dermititis Breast Left;Lower   06/18/18    1108    Breast   3                Goals of Care     Code Status: Full Code    Designated Healthcare Decision Maker:  Ms. Moger currently lacks decisional capacity for healthcare decision-making and is unable to designate a surrogate healthcare decision maker. Ms. Gohlke designated healthcare decision maker(s) is/are nada godley and [name] (the patient's adult child) as denoted by hospital policy for patients without a known preference.      Subjective   Katherine Norton is a 62 y.o. female with PMHx of liver transplant (1999) on immunosuppresion, chronic back pain, anxiety, HTN, CKD,  that presents to [facility] as a transfer from [facility]. Patient initially presented to the OSH on 05/20/18 due to acute worsening shortness of breath and altered mental status. Patient was sating 68% on RA, but 90% on CPAP. Patient had severe acidemia on ABG as well as acute on chronic renal failure with Cr of 3.89. CXR at that time showed consolidation with concern for pneumonia and was febrile. Patient's initial lactate was 2.70. Patient was started on IV ceftaroline and flagyl initially.      Objective     Vitals - past 24 hours  Temp:  [36.9 ??C (98.4 ??F)-37.6 ??C (99.7 ??F)] 37 ??C (98.6 ??F)  Heart Rate:  [87-130] 99  SpO2 Pulse:  [87-111] 99  Resp:  [15-49] 29  FiO2 (%):  [35 %] 35 %  SpO2:  [94 %-100 %] 98 % Intake/Output  I/O last 3 completed shifts:  In: 2645 [I.V.:625; NG/GT:1895; IV Piggyback:125]  Out: 3000 [Other:3000]     Physical Exam:    General: obese female, A&O person, place, year, nods y/n   HEENT: PERRLA  CV: RRR, no mrg  Pulm: coarse bs bilaterally  GI: soft, distended, intmt tender on palaption  MSK: no edema  Skin: intact x intertrigo under bilateral breasts, panniculus, and perineal area  Neuro: awake, FC, mae, gen weakness, antigrav BUE, BLE 2/5      Continuous Infusions:   ??? dextrose 5 % and sodium chloride 0.9 % 25 mL/hr (06/22/18 0000)       Scheduled Medications:   ??? calcitonin (salmon)  1 spray Alternating Nares Daily (RT)   ??? chlorhexidine  5 mL Mouth BID   ??? cycloSPORINE  75 mg NG tube BID   ??? famotidine  20 mg NG tube Daily   ??? heparin (porcine) for subcutaneous use  5,000 Units Subcutaneous [facility]   ??? insulin lispro  0-12 Units Subcutaneous Q6H [facility]   ??? lidocaine  1 patch Transdermal Daily   ??? polyethylene glycol  17 g Oral BID   ??? senna  1 tablet Oral Nightly   ??? sevelamer  800 mg NG tube 3xd Meals   ??? sodium chloride  4 mL Nebulization TID (RT)       PRN medications: acetaminophen, albuterol, dextrose, dextrose 5 % and sodium chloride 0.9 %, heparin (porcine), heparin (porcine), MORPhine injection, ondansetron, oxyCODONE      Data/Imaging Review: Reviewed in Epic and personally interpreted on 06/22/2018. See EMR for detailed results.      Critical Care Attestation     This patient is critically ill or injured with the impairment of vital organ systems such that there is a high probability of imminent or life threatening deterioration in the patient's condition. This patient must remain in the ICU for ongoing evaluation of the comprehensive management plan outlined in this note. I directly provided critical care services as documented in this note and the critical care time spent (30 min) is exclusive of separately billable procedures.    Daryel November, ACNP

## 2018-06-22 NOTE — Unmapped (Signed)
HEMODIALYSIS NURSE PROCEDURE NOTE       Treatment Number:  2 Room / Station:  Critical Care (Specify Unit & Room)([id number])    Procedure Date:  06/21/18 Device Name/Number: ginger    Total Dialysis Treatment Time:  209 Min.    CONSENT:    Written consent was obtained prior to the procedure and is detailed in the medical record.  Prior to the start of the procedure, a time out was taken and the identity of the patient was confirmed via name, medical record number and date of birth.     WEIGHT:  Hemodialysis Pre-Treatment Weights     Date/Time Pre-Treatment Weight (kg) Estimated Dry Weight (kg) Patient Goal Weight (kg) Total Goal Weight (kg)    06/21/18 1650  ??? utw  ???  3 kg (6 lb 9.8 oz)  3.55 kg (7 lb 13.2 oz)         Hemodialysis Post Treatment Weights     Date/Time Post-Treatment Weight (kg) Treatment Weight Change (kg)    06/21/18 2049  ??? utw  ???        Active Dialysis Orders (168h ago, onward)     Start     Ordered    06/23/18 0700  Hemodialysis inpatient  Every Tue,Thu,Sat     Question Answer Comment   K+ 3 meq/L    Ca++ 2.5 meq/L    Bicarb 35 meq/L    Na+ 137 meq/L    Na+ Modeling none    Dialyzer [id number]    Dialysate Temperature (C) 36    BFR-As tolerated to a maximum of: 400 mL/min    DFR 800 mL/min    Duration of treatment 3.5 Hr    Dry weight (kg) TBD    Challenge dry weight (kg) NO    Fluid removal (L) 3L ON 8/29    Tubing Adult = 142 ml    Access Site Dialysis Catheter    Access Site Location Right    Keep SBP >: 90        06/21/18 0644              ASSESSMENT:  General appearance: alert    ACCESS SITE:       Hemodialysis Catheter 06/14/18 Left Internal jugular (Active)   Site Assessment Bleeding 06/21/2018  8:50 PM   Status Deaccessed 06/21/2018  8:50 PM   Dressing Intervention New dressing 06/21/2018  8:50 PM   Dressing Status      Intact/not removed 06/21/2018  8:50 PM   Verification by X-ray Yes 06/21/2018  8:50 PM   Site Condition Bleeding 06/21/2018  8:50 PM   Dressing Type Transparent;Occlusive 06/21/2018 8:50 PM   Dressing Drainage Description Sanguineous 06/21/2018  8:50 PM   Dressing Change Due 06/28/18 06/21/2018  8:50 PM   Line Necessity Reviewed? Y 06/21/2018  8:50 PM   Line Necessity Indications Yes - Hemodialysis 06/21/2018  8:50 PM   Line Necessity Reviewed With nephrologist 06/21/2018  8:50 PM           Catheter fill volumes:    Arterial: 1.5 mL Venous: 1.7 mL   Catheter filled with 1000 units Heparin post procedure.     Patient Lines/Drains/Airways Status    Active Peripheral & Central Intravenous Access     Name:   Placement date:   Placement time:   Site:   Days:    CVC Triple Lumen 06/03/18 Non-tunneled Right Internal jugular   06/03/18    1503    Internal jugular  18               LAB RESULTS:  Lab Results   Component Value Date    NA 134 (L) 06/21/2018    K 3.6 06/21/2018    CL 105 06/21/2018    CO2 26.0 06/21/2018    BUN 13 06/21/2018    CALCIUM 7.9 (L) 06/21/2018    CAION 4.67 06/20/2018    PHOS 3.8 06/21/2018    MG 1.8 06/21/2018    PTH 293.0 12/05/2017    TOPIRAMATE 12.2 12/05/2017    IRON 202 (H) 05/30/2013    LABIRON >100 (H) 05/30/2013    TRANSFERRIN 138 (L) 05/30/2013    FERRITIN 944 01/07/2010    TIBC 174 (L) 05/30/2013     Lab Results   Component Value Date    WBC 2.2 (L) 06/21/2018    HGB 7.6 (L) 06/21/2018    HCT 23.7 (L) 06/21/2018    PLT 144 (L) 06/21/2018    PHART 7.37 06/21/2018    PO2ART 112.0 (H) 06/21/2018    PCO2ART 42.0 06/21/2018    HCO3ART 24 06/21/2018    BEART -1.0 06/21/2018    O2SATART 98.5 06/21/2018    APTT 31.9 06/21/2018    HEPBSAG Negative 10/09/2014        VITAL SIGNS:  Temperature     Date/Time Temp Temp src      06/21/18 2100  37 ??C (98.6 ??F)  Oral     06/21/18 2055  37 ??C (98.6 ??F)  Oral         Hemodynamics     Date/Time Pulse BP MAP (mmHg) Patient Position    06/21/18 2100  104  ???  ???  ???    06/21/18 2055  106  ???  ???  ???    06/21/18 2045  101  ???  ???  ???    06/21/18 2030  100  ???  ???  ???    06/21/18 2015  104  ???  ???  ???    06/21/18 2000  93  ???  ???  ???    06/21/18 1945  99  ???  ???  ??? 06/21/18 1930  93  ???  ???  ???    06/21/18 1915  95  ???  ???  ???    06/21/18 1900  92  ???  ???  ???    06/21/18 1845  90  ???  ???  ???    06/21/18 1836  95  ???  ???  ???    06/21/18 1830  97  ???  ???  ???    06/21/18 1815  102  ???  ???  ???    06/21/18 1800  96  ???  ???  Lying    06/21/18 1745  95  ???  ???  ???    06/21/18 1730  93  ???  ???  ???          Oxygen Therapy     Date/Time Resp SpO2 O2 Device FiO2 (%) O2 Flow Rate (L/min)    06/21/18 2114  ???  ???  ???  35 %  ???    06/21/18 2100  27  97 %  ???  ???  ???    06/21/18 2055  30  96 %  ???  ???  ???    06/21/18 2045  29  97 %  ???  35 %  ???    06/21/18 2030  25  97 %  ???  35 %  ???    06/21/18 2015  30  97 %  ???  35 %  ???    06/21/18 2009  ???  ???  ???  35 %  ???    06/21/18 2000  22  97 %  ???  35 %  ???    06/21/18 1945  24  98 %  ???  35 %  ???    06/21/18 1930  27  97 %  ???  35 %  ???    06/21/18 1915  27  96 %  ???  35 %  ???    06/21/18 1900  24  97 %  ???  35 %  ???    06/21/18 1845  26  98 %  ???  35 %  ???    06/21/18 1836  28  100 %  ???  35 %  ???    06/21/18 1830  23  97 %  ???  35 %  ???    06/21/18 1815  28  97 %  ???  35 %  ???    06/21/18 1800  26  97 %  Ventilator  35 %  ???    06/21/18 1745  27  100 %  ???  35 %  ???    06/21/18 1730  24  100 %  ???  35 %  ???          Pre-Hemodialysis Assessment     Date/Time Therapy Number Dialyzer Hemodialysis Line Type All Machine Alarms Passed    06/21/18 1650  2  F-180 (98 mLs)  Adult (142 m/s)  Yes    Date/Time Air Detector Saline Line Double Clampled Hemo-Safe Applied Dialysis Flow (mL/min)    06/21/18 1650  Engaged  ???  ???  800 mL/min    Date/Time Verify Priming Solution Priming Volume Hemodialysis Independent pH Hemodialysis Machine Conductivity (mS/cm)    06/21/18 1650  0.9% NS  300 mL  7.2  13.9 mS/cm    Date/Time Hemodialysis Independent Conductivity (mS/cm) Bicarb Conductivity Residual Bleach Negative Total Chlorine    06/21/18 1650  13.9 mS/cm --  Yes  0        Pre-Hemodialysis Treatment Comments     Date/Time Pre-Hemodialysis Comments    06/21/18 1650  pt stable and alert        Hemodialysis Treatment     Date/Time Blood Flow Rate (mL/min) Arterial Pressure (mmHg) Venous Pressure (mmHg) Transmembrane Pressure (mmHg)    06/21/18 2045  200 mL/min  ???  ???  ???    06/21/18 2030  350 mL/min  -120 mmHg  130 mmHg  40 mmHg    06/21/18 2015  350 mL/min  -120 mmHg  130 mmHg  40 mmHg    06/21/18 2000  350 mL/min  -120 mmHg  130 mmHg  40 mmHg    06/21/18 1945  400 mL/min  -180 mmHg  170 mmHg  40 mmHg    06/21/18 1930  400 mL/min  -180 mmHg  170 mmHg  40 mmHg    06/21/18 1915  400 mL/min  -120 mmHg  150 mmHg  40 mmHg    06/21/18 1900  400 mL/min  -110 mmHg  150 mmHg  40 mmHg    06/21/18 1845  400 mL/min  -110 mmHg  150 mmHg  40 mmHg    06/21/18 1830  400 mL/min  -110 mmHg  150 mmHg  40 mmHg    06/21/18 1815  400 mL/min  -110 mmHg  150 mmHg  40 mmHg    06/21/18 1800  400 mL/min  -110 mmHg  140 mmHg  40 mmHg    06/21/18 1745  400 mL/min  -110 mmHg  140 mmHg  50 mmHg    06/21/18 1730  400 mL/min  -100 mmHg  130 mmHg  50 mmHg    06/21/18 1716  200 mL/min  -40 mmHg  50 mmHg  30 mmHg    Date/Time Ultrafiltration Rate (mL/hr) Ultrafiltrate Removed (mL) Dialysate Flow Rate (mL/min) KECN Linna Caprice)    06/21/18 2045  70 mL/hr  3550 mL  ???  ???    06/21/18 2030  1010 mL/hr  3210 mL  800 ml/min  ???    06/21/18 2015  1010 mL/hr  2960 mL  800 ml/min  ???    06/21/18 2000  1010 mL/hr  2700 mL  800 ml/min  ???    06/21/18 1945  1010 mL/hr  2501 mL  800 ml/min  ???    06/21/18 1930  1010 mL/hr  2200 mL  800 ml/min  ???    06/21/18 1915  1010 mL/hr  1955 mL  800 ml/min  ???    06/21/18 1900  1010 mL/hr  1620 mL  800 ml/min  ???    06/21/18 1845  1010 mL/hr  1400 mL  800 ml/min  ???    06/21/18 1830  1010 mL/hr  1296 mL  800 ml/min  ???    06/21/18 1815  1010 mL/hr  1000 mL  800 ml/min  ???    06/21/18 1800  1010 mL/hr  689 mL  800 ml/min  ???    06/21/18 1745  1010 mL/hr  462 mL  800 ml/min  ???    06/21/18 1730  1010 mL/hr  246 mL  800 ml/min  ???    06/21/18 1716  1010 mL/hr  0 mL  800 ml/min  ???        Hemodialysis Treatment Comments     Date/Time Intra-Hemodialysis Comments    06/21/18 2045  tx completed    06/21/18 2030  dressing changed, still some bleeding, applied pressure dressing, primary RN aware, instructed to assess and intervene if worsen overnight    06/21/18 2015  vss    06/21/18 2000  respiratory tech at bedside    06/21/18 1945  noticed some bleeding from cath exit site, Dr. Thedore Mins notified    06/21/18 1930  primary RN aware pt reuesting to be suction    06/21/18 1915  pt requesting to be suction,will notify primary RN    06/21/18 1900  vss    06/21/18 1845  nad    06/21/18 1830  vss    06/21/18 1815  respiratory tech at bedside    06/21/18 1800  primary RN at bedside    06/21/18 1745  primary RN at bedside to suction pt    06/21/18 1730  seen and examined by Dr. Toni Arthurs at bedside    06/21/18 1716  tx initiated        Post Treatment     Date/Time Rinseback Volume (mL) On Line Clearance: spKt/V Total Liters Processed (L/min) Dialyzer Clearance    06/21/18 2049  300 mL  ???  73.2 L/min  Moderately streaked        Post Hemodialysis Treatment Comments     Date/Time Post-Hemodialysis Comments    06/21/18 2049  pt stable post tx no distress  Hemodialysis I/O     Date/Time Total Hemodialysis Replacement Volume (mL) Total Ultrafiltrate Output (mL)    06/21/18 2049  ???  3000 mL          [id number] - Medicaitons Given During Treatment  (last 4 hrs)         Gwendlyn Deutscher, RN       Medication Name Action Time Action Route Rate Dose User     heparin (porcine) 1000 unit/mL injection 1,500 Units 06/21/18 2046 Given Intra-cannular  1,500 Units Gwendlyn Deutscher, RN     heparin (porcine) 1000 unit/mL injection 1,700 Units 06/21/18 2046 Given Intra-cannular  1,700 Units Gwendlyn Deutscher, RN          Maureen Ralphs, RRT       Medication Name Action Time Action Route Rate Dose User     albuterol nebulizer solution 2.5 mg 06/21/18 2008 Given Nebulization  2.5 mg Maureen Ralphs, RRT     sodium chloride 3 % nebulizer solution 4 mL 06/21/18 2016 Given Nebulization  4 mL Maureen Ralphs, RRT Carrington Clamp, RN       Medication Name Action Time Action Route Rate Dose User     MORPhine 4 mg/mL injection 2 mg 06/21/18 1836 Given Intravenous  2 mg Carrington Clamp, RN     alteplase (ACTIVASE) injection small catheter clearance 2 mg 06/21/18 1759 Not Given Intravenous  2 mg Carrington Clamp, RN     insulin lispro (HumaLOG) injection 0-12 Units 06/21/18 1759 Not Given Subcutaneous   Carrington Clamp, RN     sevelamer (RENAGEL) oral solution 06/21/18 1758 Given NG tube  800 mg Carrington Clamp, RN

## 2018-06-22 NOTE — Unmapped (Signed)
Problem: Device-Related Complication Risk (Hemodialysis)  Goal: Safe, Effective Therapy Delivery  Outcome: Progressing    3.5hrs of HD treatment today, to remove 3L as tolerated per MD. Using HD catheter LIJ, Arterial sluggish,lines reversed MD aware.

## 2018-06-22 NOTE — Unmapped (Signed)
Problem: Communication Impairment (Mechanical Ventilation, Invasive)  Goal: Effective Communication  Outcome: Ongoing - Unchanged   PT remained on same settings overnight, tolerating well. PT received inhaled treatments as ordered via metaneb. Pt suctioned for small amount of thick tan secretions.   Pt unable to pass SBT due to agitation, increase HR and BP, RSBI>105.

## 2018-06-23 DIAGNOSIS — J9601 Acute respiratory failure with hypoxia: Principal | ICD-10-CM

## 2018-06-23 LAB — BLOOD GAS CRITICAL CARE PANEL, ARTERIAL
BASE EXCESS ARTERIAL: 0.5 (ref -2.0–2.0)
CALCIUM IONIZED ARTERIAL (MG/DL): 4.63 mg/dL (ref 4.40–5.40)
GLUCOSE WHOLE BLOOD: 102 mg/dL
HEMOGLOBIN BLOOD GAS: 8.5 g/dL — ABNORMAL LOW (ref 12.00–16.00)
LACTATE BLOOD ARTERIAL: 0.5 mmol/L (ref <=1.2)
PH ARTERIAL: 7.36 (ref 7.35–7.45)
PO2 ARTERIAL: 91.5 mmHg (ref 80.0–110.0)
POTASSIUM WHOLE BLOOD: 3.6 mmol/L (ref 3.4–4.6)
SODIUM WHOLE BLOOD: 134 mmol/L — ABNORMAL LOW (ref 135–145)

## 2018-06-23 LAB — COMPREHENSIVE METABOLIC PANEL
ALBUMIN: 2.5 g/dL — ABNORMAL LOW (ref 3.5–5.0)
ALKALINE PHOSPHATASE: 75 U/L (ref 38–126)
ALT (SGPT): <8 U/L — ABNORMAL LOW (ref 15–48)
ANION GAP: 3 mmol/L — ABNORMAL LOW (ref 9–15)
AST (SGOT): 22 U/L (ref 14–38)
BILIRUBIN TOTAL: 0.6 mg/dL (ref 0.0–1.2)
BLOOD UREA NITROGEN: 13 mg/dL (ref 7–21)
CALCIUM: 8 mg/dL — ABNORMAL LOW (ref 8.5–10.2)
CHLORIDE: 107 mmol/L (ref 98–107)
CO2: 26 mmol/L (ref 22.0–30.0)
CREATININE: 2.92 mg/dL — ABNORMAL HIGH (ref 0.60–1.00)
EGFR CKD-EPI AA FEMALE: 19 mL/min/{1.73_m2} — ABNORMAL LOW (ref >=60)
EGFR CKD-EPI NON-AA FEMALE: 16 mL/min/{1.73_m2} — ABNORMAL LOW (ref >=60)
GLUCOSE RANDOM: 104 mg/dL (ref 65–179)
POTASSIUM: 3.9 mmol/L (ref 3.5–5.0)
PROTEIN TOTAL: 6.7 g/dL (ref 6.5–8.3)
SODIUM: 136 mmol/L (ref 135–145)

## 2018-06-23 LAB — SPECIMEN SOURCE

## 2018-06-23 LAB — CBC
HEMATOCRIT: 25.1 % — ABNORMAL LOW (ref 36.0–46.0)
HEMOGLOBIN: 8.3 g/dL — ABNORMAL LOW (ref 12.0–16.0)
MEAN CORPUSCULAR HEMOGLOBIN CONC: 33 g/dL (ref 31.0–37.0)
MEAN CORPUSCULAR VOLUME: 94.7 fL (ref 80.0–100.0)
MEAN PLATELET VOLUME: 9.6 fL (ref 7.0–10.0)
PLATELET COUNT: 176 10*9/L (ref 150–440)
RED BLOOD CELL COUNT: 2.65 10*12/L — ABNORMAL LOW (ref 4.00–5.20)
RED CELL DISTRIBUTION WIDTH: 16.4 % — ABNORMAL HIGH (ref 12.0–15.0)
WBC ADJUSTED: 2.1 10*9/L — ABNORMAL LOW (ref 4.5–11.0)

## 2018-06-23 LAB — PHOSPHORUS: Phosphate:MCnc:Pt:Ser/Plas:Qn:: 3.4

## 2018-06-23 LAB — APTT: HEPARIN CORRELATION: 0.2

## 2018-06-23 LAB — PROTIME: Lab: 13 — ABNORMAL HIGH

## 2018-06-23 LAB — MEAN CORPUSCULAR HEMOGLOBIN CONC: Lab: 33

## 2018-06-23 LAB — MAGNESIUM: Magnesium:MCnc:Pt:Ser/Plas:Qn:: 1.8

## 2018-06-23 LAB — CO2: Carbon dioxide:SCnc:Pt:Ser/Plas:Qn:: 26

## 2018-06-23 LAB — HEPARIN CORRELATION: Lab: 0.2

## 2018-06-23 NOTE — Unmapped (Signed)
Patient resting, repositioned q2h; pt cont intuabted, suctioned prn; keep hob elevated; prn given for pain, agitation; to monitor.

## 2018-06-23 NOTE — Unmapped (Signed)
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 female with  PMHx of liver transplant (1999) on immunosuppresion, chronic back pain, anxiety, HTN, CKD,  that presents to  [facility] as a transfer from [facility] on 8/10, with respiratory failure and possible contained duodenal perforation. She was transferred to MICU the following day for vent mngmt since there wasn't any evid of perf per gen surg.     24hr events: No events overnight    Dispo: ICU    Neurological   #acute encephalopathy: resolved    #acute on chronic pain, sedation  -cont lidoderm patch to back and tylenol prn to minimize narc  -  oxy to 5 q 6 PRN. + PRN morphine for breakthrough    Pulmonary   #acute hypoxic respiratory failure likely due to multifocal PNA, ARDS.  Intubated on 8/1 at OSH CT chest with Scattered airspace opacities in the left apex, left upper lobe and superior segment of left lower lobe; concerning for pneumonia.  Extubated 8/12 and reint 8/13 for progressive hypoxia and inc WOB .  Extubated 8/25.  Reintubated on 8/28 am after R sided plug/collpase.    - pt and family amenable to trach/peg   - optimal PEEP 14 per esophogeal balloon eval (8/14) .  Consulted ENT, but unable to trach until PEEP < 10  And PEEP down to 5 this am.  ENT re-called.     - aggressive pulm toilet as tol  - hypertonic nebs  - Awaiting OR today for trach    Cardiovascular   #acute HFrEF  #h/o hypertension   - hold home metoprolol and amlodipine   - prn hydralazine  - Echo 7/29: EF 65-70%, mod pulm HTN and borderline dilated RV   - repeat echo 8/16 EF >55%, severe RV fx and dilated RA, severe phtn, mod TR    #Hypotension, resolved:  - developed pressor requirement 8/15  - previously on norepi gtt for MAP>65, currently off since 8/22    Renal   #Acute on chronic renal failure  - Cr baseline appears to be ~1.7--> 3-->2.87  - nephrology consulted 8/16, appreciate recs >> CRRT started 8/22, clotted off 8/25. First iHD on 8/27 with 2.5 L removed.  Tolerated well.    - foley removed 8/27  - Plan iHD today 8/29    Infectious Disease/Autoimmune   #septic shock due to ? PNA.  Initially seen at OSH and had c/f perforated duodenal ulcer, but CT scan here non concerning.  CT chest with possible multifocal PNA.    - appreciate ID's assistance. ID signed off 8/17  - WOCN consulted for intertrigo and sacral wound (see recs 8/19)  - last Blood/urine/sputum cxs sent 8/21, abx given x 1 dose    Abx:   Cefepime (8/22- 8/22)  Fluconazole (8/22- 8/22)  Vanco (8/11-->8/12)  Mica (8/11-->8/12)  Flagyl (8/10-->8/14)  Cefepime  (8/10-->8/16)  Voriconazole (8/12-->8/15)  Linezolid (8/15-8/16)  Abx: OSH  ceftaroline and flagyl  levaquin  meropenem     Cx OSH:  - 7/29 Strep pneumo Ag neg, RVP neg, urine Legionella Ag neg  - 7/29 bd cx NGTD  OSH: Fungal, pneumocystis/histoplasma, AFB, Gram on BAL all were negative.    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      Malnutrition Assessment: Not done yet.  Patient does not meet AND/ASPEN criteria for malnutrition at this time (06/13/18 1522)  - FMS for liquid stools, output reduced >> consider remove today      Heme/Coag   #Critical illness anemia  - last transfusion 2 units PRBC 8/20-8/21 and 1unit 8/26, no s/s of bleeding  - SCDs  and heparin for DVT ppx    Endocrine   #Hypoglycemia  - cont accu check while TF on hold  - glucose within target range yes      Prophylaxis/LDA/Restraints/Consults     CHECKLIST   Can CVC be removed? No: dialysis catheter   Can A-line be removed? no  Can Foley be removed? removed  Mobility plan: Step 1 - Range of motion    Feeding: adv to goal  Analgesia: Pain adequately controlled  Sedation SAT/SBT: N/A  Thrombembolic ppx: SQ heparin  Head of bed >30 degrees: Yes  Ulcer ppx: Yes, home use continued  Glucose within target range: Not in range, titrating medications    RASS at goal? Yes  [place] Agitation Assessment Scale (RASS) : 0 (06/23/2018  8:00 AM)     Can antipsychotics be stopped? N/A, not on antipsychotics  CAM-ICU Result: Positive (06/23/2018  8:00 AM)      Patient Lines/Drains/Airways Status    Active Active Lines, Drains, & Airways     Name:   Placement date:   Placement time:   Site:   Days:    ETT  7.5   06/20/18    0914     2    CVC Triple Lumen 06/03/18 Non-tunneled Right Internal jugular   06/03/18    1503    Internal jugular   19    NG/OG Tube Right nostril   06/02/18    2121    Right nostril   20    Arterial Line 06/03/18 Right Radial   06/03/18    0007    Radial   20    Hemodialysis Catheter 06/14/18 Left Internal jugular   06/14/18    1400    Internal jugular   8              Patient Lines/Drains/Airways Status    Active Wounds     Name:   Placement date:   Placement time:   Site:   Days:    Wound 06/08/18 Pressure Injury Buttocks Posterior pink Stage 2   06/08/18    0001    Buttocks   15    Wound 06/09/18 Rash/Dermititis Perineum   06/09/18    2154    Perineum   13    Wound 06/18/18 Abdomen Lower   06/18/18  1102    Abdomen   4    Wound 06/18/18 Rash/Dermititis Breast Left;Lower   06/18/18    1108    Breast   4                Goals of Care     Code Status: Full Code    Designated Healthcare Decision Maker:  Ms. Krone currently lacks decisional capacity for healthcare decision-making and is unable to designate a surrogate healthcare decision maker. Ms. Neuhaus designated healthcare decision maker(s) is/are jannelly bergren and [name] (the patient's adult child) as denoted by hospital policy for patients without a known preference.      Subjective   Katherine Norton is a 62 y.o. female with PMHx of liver transplant (1999) on immunosuppresion, chronic back pain, anxiety, HTN, CKD,  that presents to [facility] as a transfer from [facility]. Patient initially presented to the OSH on 05/20/18 due to acute worsening shortness of breath and altered mental status. Patient was sating 68% on RA, but 90% on CPAP. Patient had severe acidemia on ABG as well as acute on chronic renal failure with Cr of 3.89. CXR at that time showed consolidation with concern for pneumonia and was febrile. Patient's initial lactate was 2.70. Patient was started on IV ceftaroline and flagyl initially.      Objective     Vitals - past 24 hours  Temp:  [36.8 ??C (98.2 ??F)-37.7 ??C (99.9 ??F)] 37.1 ??C (98.8 ??F)  Heart Rate:  [80-105] 94  SpO2 Pulse:  [80-105] 94  Resp:  [0-28] 22  FiO2 (%):  [35 %] 35 %  SpO2:  [97 %-100 %] 100 % Intake/Output  I/O last 3 completed shifts:  In: 1940 [I.V.:425; NG/GT:1390; IV Piggyback:125]  Out: 3000 [Other:3000]     Physical Exam:    General: obese female, A&O person, place, year, nods y/n   HEENT: PERRLA  CV: RRR, no mrg  Pulm: coarse bs bilaterally  GI: soft, distended, intmt tender on palaption  MSK: no edema  Skin: intact x intertrigo under bilateral breasts, panniculus, and perineal area  Neuro: awake, FC, mae, gen weakness, antigrav BUE, BLE 2/5      Continuous Infusions:   ??? dextrose 5 % and sodium chloride 0.9 % 25 mL/hr (06/22/18 1600)       Scheduled Medications:   ??? calcitonin (salmon)  1 spray Alternating Nares Daily (RT)   ??? chlorhexidine  5 mL Mouth BID   ??? cycloSPORINE  75 mg NG tube BID ??? famotidine  20 mg NG tube Daily   ??? heparin (porcine) for subcutaneous use  5,000 Units Subcutaneous [facility]   ??? insulin lispro  0-12 Units Subcutaneous Q6H [facility]   ??? lidocaine  1 patch Transdermal Daily   ??? polyethylene glycol  17 g Oral BID   ??? senna  1 tablet Oral Nightly   ??? sevelamer  800 mg NG tube 3xd Meals   ??? sodium chloride  4 mL Nebulization TID (RT)       PRN medications: acetaminophen, albuterol, dextrose, dextrose 5 % and sodium chloride 0.9 %, heparin (porcine), heparin (porcine), MORPhine injection, ondansetron, oxyCODONE      Data/Imaging Review: Reviewed in Epic and personally interpreted on 06/23/2018. See EMR for detailed results.      Critical Care Attestation     This patient is critically ill or injured with the impairment of vital organ systems such that there is a high probability of imminent or  life threatening deterioration in the patient's condition. This patient must remain in the ICU for ongoing evaluation of the comprehensive management plan outlined in this note. I directly provided critical care services as documented in this note and the critical care time spent (30 min) is exclusive of separately billable procedures.    Daryel November, ACNP

## 2018-06-23 NOTE — Unmapped (Signed)
Patient is currently on the ventilator and settings have not changed. Currently on SBT and appears to be tolerating well. Will likely pass. ABG pending.

## 2018-06-23 NOTE — Unmapped (Signed)
Vented ,no vent changes today .Remains alert and following commands able to tell what he needs ,mouth words.Tube feeds rate decreased due to pt complained of abdominal  due to T F . Continue plan of care . Family at bedside , updated. Pt stooling .  Problem: Adult Inpatient Plan of Care  Goal: Plan of Care Review  Outcome: Progressing  Goal: Patient-Specific Goal (Individualization)  Outcome: Progressing  Goal: Absence of Hospital-Acquired Illness or Injury  Outcome: Progressing  Goal: Optimal Comfort and Wellbeing  Outcome: Progressing  Goal: Readiness for Transition of Care  Outcome: Progressing  Goal: Rounds/Family Conference  Outcome: Progressing     Problem: Latex Allergy  Goal: Absence of Allergy Symptoms  Outcome: Progressing     Problem: Skin Injury Risk Increased  Goal: Skin Health and Integrity  Outcome: Progressing     Problem: Fall Injury Risk  Goal: Absence of Fall and Fall-Related Injury  Outcome: Progressing     Problem: Self-Care Deficit  Goal: Improved Ability to Complete Activities of Daily Living  Outcome: Progressing     Problem: Hypertension Comorbidity  Goal: Blood Pressure in Desired Range  Outcome: Progressing     Problem: Pain Chronic (Persistent) (Comorbidity Management)  Goal: Acceptable Pain Control and Functional Ability  Outcome: Progressing     Problem: Fluid Imbalance (Pneumonia)  Goal: Fluid Balance  Outcome: Progressing     Problem: Infection (Pneumonia)  Goal: Resolution of Infection Signs/Symptoms  Outcome: Progressing     Problem: Respiratory Compromise (Pneumonia)  Goal: Effective Oxygenation and Ventilation  Outcome: Progressing     Problem: Device-Related Complication Risk (Hemodialysis)  Goal: Safe, Effective Therapy Delivery  Outcome: Progressing     Problem: Hemodynamic Instability (Hemodialysis)  Goal: Vital Signs Remain in Desired Range  Outcome: Progressing     Problem: Infection (Hemodialysis)  Goal: Absence of Infection Signs/Symptoms  Outcome: Progressing     Problem: Communication Impairment (Mechanical Ventilation, Invasive)  Goal: Effective Communication  Outcome: Progressing     Problem: Noninvasive Ventilation Acute  Goal: Effective Unassisted Ventilation and Oxygenation  Outcome: Progressing

## 2018-06-24 LAB — CBC
HEMATOCRIT: 27 % — ABNORMAL LOW (ref 36.0–46.0)
HEMOGLOBIN: 8.5 g/dL — ABNORMAL LOW (ref 12.0–16.0)
MEAN CORPUSCULAR HEMOGLOBIN CONC: 31.5 g/dL (ref 31.0–37.0)
MEAN CORPUSCULAR HEMOGLOBIN: 30 pg (ref 26.0–34.0)
MEAN CORPUSCULAR VOLUME: 95.4 fL (ref 80.0–100.0)
PLATELET COUNT: 223 10*9/L (ref 150–440)
RED BLOOD CELL COUNT: 2.83 10*12/L — ABNORMAL LOW (ref 4.00–5.20)
RED CELL DISTRIBUTION WIDTH: 16 % — ABNORMAL HIGH (ref 12.0–15.0)
WBC ADJUSTED: 2.3 10*9/L — ABNORMAL LOW (ref 4.5–11.0)

## 2018-06-24 LAB — COMPREHENSIVE METABOLIC PANEL
ALBUMIN: 2.7 g/dL — ABNORMAL LOW (ref 3.5–5.0)
ALKALINE PHOSPHATASE: 83 U/L (ref 38–126)
ALT (SGPT): <8 U/L — ABNORMAL LOW (ref 15–48)
ANION GAP: 1 mmol/L — ABNORMAL LOW (ref 9–15)
AST (SGOT): 22 U/L (ref 14–38)
BILIRUBIN TOTAL: 0.7 mg/dL (ref 0.0–1.2)
BLOOD UREA NITROGEN: 9 mg/dL (ref 7–21)
BUN / CREAT RATIO: 5
CALCIUM: 7.9 mg/dL — ABNORMAL LOW (ref 8.5–10.2)
CHLORIDE: 106 mmol/L (ref 98–107)
CO2: 26 mmol/L (ref 22.0–30.0)
CREATININE: 1.84 mg/dL — ABNORMAL HIGH (ref 0.60–1.00)
EGFR CKD-EPI AA FEMALE: 33 mL/min/{1.73_m2} — ABNORMAL LOW (ref >=60)
EGFR CKD-EPI NON-AA FEMALE: 29 mL/min/{1.73_m2} — ABNORMAL LOW (ref >=60)
GLUCOSE RANDOM: 111 mg/dL (ref 65–179)
POTASSIUM: 3.9 mmol/L (ref 3.5–5.0)
PROTEIN TOTAL: 7.1 g/dL (ref 6.5–8.3)

## 2018-06-24 LAB — PHOSPHORUS: Phosphate:MCnc:Pt:Ser/Plas:Qn:: 2.3 — ABNORMAL LOW

## 2018-06-24 LAB — MAGNESIUM: Magnesium:MCnc:Pt:Ser/Plas:Qn:: 1.8

## 2018-06-24 LAB — APTT
Coagulation surface induced:Time:Pt:PPP:Qn:Coag: 30.7
HEPARIN CORRELATION: 0.2

## 2018-06-24 LAB — BLOOD UREA NITROGEN: Urea nitrogen:MCnc:Pt:Ser/Plas:Qn:: 9

## 2018-06-24 LAB — HEMOGLOBIN: Lab: 8.5 — ABNORMAL LOW

## 2018-06-24 LAB — INR: Lab: 1.12

## 2018-06-24 NOTE — Unmapped (Signed)
HEMODIALYSIS NURSE PROCEDURE NOTE    Treatment Number:  3 Room/Station:  Critical Care (Specify Unit & Room) Procedure Date:  06/23/18   Total Treatment Time:  2,925 Min.    CONSENT:  Written consent was obtained prior to the procedure and is detailed in the medical record. Prior to the start of the procedure, a time out was taken and the identity of the patient was confirmed via name, medical record number and date of birth.     WEIGHTS:  Hemodialysis Pre-Treatment Weights     Date/Time Pre-Treatment Weight (kg) Estimated Dry Weight (kg) Patient Goal Weight (kg) Total Goal Weight (kg)    06/23/18 1741  ??? no record weight - vent   ??? tbd   2 kg (4 lb 6.6 oz)  ???           Hemodialysis Post Treatment Weights     Date/Time Post-Treatment Weight (kg) Treatment Weight Change (kg)    06/23/18 2131  ??? [facility]-ICU  ???        Active Dialysis Orders (168h ago, onward)     Start     Ordered    06/26/18 0700  Hemodialysis inpatient  Every Tue,Thu,Sat     Question Answer Comment   K+ 3 meq/L    Ca++ 2.5 meq/L    Bicarb 35 meq/L    Na+ 137 meq/L    Na+ Modeling none    Dialyzer F180NR    Dialysate Temperature (C) 36    BFR-As tolerated to a maximum of: 400 mL/min    DFR 800 mL/min    Duration of treatment 3.5 Hr    Dry weight (kg) TBD    Challenge dry weight (kg) NO    Fluid removal (L) 2L ON 8/31    Tubing Adult = 142 ml    Access Site Dialysis Catheter    Access Site Location Right    Keep SBP >: 90        06/23/18 0808    06/23/18 0700  Hemodialysis inpatient  Every Tue,Thu,Sat,   Status:  Canceled     Question Answer Comment   K+ 3 meq/L    Ca++ 2.5 meq/L    Bicarb 35 meq/L    Na+ 137 meq/L    Na+ Modeling none    Dialyzer [id number]    Dialysate Temperature (C) 36    BFR-As tolerated to a maximum of: 400 mL/min    DFR 800 mL/min    Duration of treatment 3.5 Hr    Dry weight (kg) TBD    Challenge dry weight (kg) NO    Fluid removal (L) 3L ON 8/29    Tubing Adult = 142 ml    Access Site Dialysis Catheter    Access Site Location Right Keep SBP >: 90        06/21/18 0644              ACCESS SITE:       Hemodialysis Catheter 06/14/18 Left Internal jugular (Active)   Site Assessment Clean;Dry;Intact 06/23/2018  7:45 PM   Status Accessed 06/23/2018  7:45 PM   Dressing Intervention Dressing changed 06/23/2018  4:00 AM   Dressing Status      Clean;Dry;Intact/not removed 06/23/2018  7:45 PM   Verification by X-ray Yes 06/23/2018  4:00 PM   Site Condition No complications 06/23/2018  7:45 PM   Dressing Type Transparent;Occlusive 06/23/2018  7:45 PM   Dressing Drainage Description Other (Comment) 06/23/2018  4:00 AM  Dressing Change Due 06/29/18 06/23/2018  7:45 PM   Line Necessity Reviewed? Y 06/23/2018  7:45 PM   Line Necessity Indications Yes - Hemodialysis 06/23/2018  7:45 PM   Line Necessity Reviewed With Nephrology 06/23/2018  7:45 PM           Catheter Fill Volumes:  Arterial:  1.5 mL Venous:  1.7 mL   Catheter filled with 1000 units Heparin post procedure.    Patient Lines/Drains/Airways Status    Active Peripheral & Central Intravenous Access     Name:   Placement date:   Placement time:   Site:   Days:    CVC Triple Lumen 06/03/18 Non-tunneled Right Internal jugular   06/03/18    1503    Internal jugular   20              LAB RESULTS:  Lab Results   Component Value Date    NA 136 06/23/2018    NA 134 (L) 06/23/2018    K 3.9 06/23/2018    K 3.6 06/23/2018    CL 107 06/23/2018    CO2 26.0 06/23/2018    BUN 13 06/23/2018    CALCIUM 8.0 (L) 06/23/2018    CAION 4.63 06/23/2018    PHOS 3.4 06/23/2018    MG 1.8 06/23/2018    PTH 293.0 12/05/2017    TOPIRAMATE 12.2 12/05/2017    IRON 202 (H) 05/30/2013    LABIRON >100 (H) 05/30/2013    TRANSFERRIN 138 (L) 05/30/2013    FERRITIN 944 01/07/2010    TIBC 174 (L) 05/30/2013     Lab Results   Component Value Date    WBC 2.1 (L) 06/23/2018    HGB 8.3 (L) 06/23/2018    HCT 25.1 (L) 06/23/2018    PLT 176 06/23/2018    PHART 7.36 06/23/2018    PO2ART 91.5 06/23/2018    PCO2ART 46.4 (H) 06/23/2018    HCO3ART 25 06/23/2018 BEART 0.5 06/23/2018    O2SATART 97.2 06/23/2018    APTT 29.8 06/23/2018    HEPBSAG Negative 10/09/2014      VITAL SIGNS:  Temperature     Date/Time Temp Temp src      06/23/18 2130  37.2 ??C (99 ??F)  Oral     06/23/18 1740  37.4 ??C (99.3 ??F)  Oral         Hemodynamics     Date/Time Pulse BP MAP (mmHg) Arterial Line BP    06/23/18 2134  98  ???  ???  ???    06/23/18 2130  92  ???  ???  ???    06/23/18 2115  91  ???  ???  ???    06/23/18 2100  90  ???  ???  ???    06/23/18 2045  90  ???  ???  ???    06/23/18 2030  113  ???  ???  ???    06/23/18 2015  80  ???  ???  ???    06/23/18 2000  95  ???  ???  ???    06/23/18 1945  82  ???  ???  ???    06/23/18 1930  83  ???  ???  ???    06/23/18 1915  77  ???  ???  ???    06/23/18 1900  81  ???  ???  ???    06/23/18 1845  82  ???  ???  ???    06/23/18 1830  86  ???  ???  ???  06/23/18 1815  89  ???  ???  ???    06/23/18 1800  74  ???  ???  ???    06/23/18 1756  84  ???  ???  ???    06/23/18 1740  79  ???  ???  ???    Date/Time Arterial Line MAP Arterial Line BP 2 Arterial Line MAP Patient Position    06/23/18 2134  ???  168/80  111 mmHg  ???    06/23/18 2130  ???  171/82  115 mmHg  ???    06/23/18 2115  ???  160/77  106 mmHg  ???    06/23/18 2100  ???  162/80  108 mmHg  ???    06/23/18 2045  ???  169/82  113 mmHg  ???    06/23/18 2030  ???  184/87  125 mmHg  ???    06/23/18 2015  ???  168/79  110 mmHg  ???    06/23/18 2000  ???  169/81  117 mmHg  ???    06/23/18 1945  ???  164/76  106 mmHg  ???    06/23/18 1930  ???  166/79  110 mmHg  Lying    06/23/18 1915  ???  168/80  112 mmHg  Lying    06/23/18 1900  ???  166/80  111 mmHg  Lying    06/23/18 1845  ???  163/78  108 mmHg  Lying    06/23/18 1830  ???  168/82  115 mmHg  Lying    06/23/18 1815  ???  165/84  113 mmHg  Lying    06/23/18 1800  ???  159/78  108 mmHg  Lying    06/23/18 1756  ???  165/82  114 mmHg  Lying    06/23/18 1740  ???  152/75  101 mmHg  ???          Oxygen Therapy     Date/Time Resp SpO2 O2 Device FiO2 (%) O2 Flow Rate (L/min)    06/23/18 2134  20  100 %  ???  35 %  ???    06/23/18 2130  22  100 %  ???  35 %  ???    06/23/18 2115  23  100 %  ???  35 %  ???    06/23/18 2100  21  100 %  ???  35 %  ???    06/23/18 2045  20  100 %  ???  35 %  ???    06/23/18 2041  ???  ???  ???  35 %  ???    06/23/18 2030  26  99 %  ???  35 %  ???    06/23/18 2015  25  100 %  ???  35 %  ???    06/23/18 2000  20  100 %  ???  35 %  ???    06/23/18 1945  22  100 %  ???  35 %  ???    06/23/18 1930  20  100 %  Ventilator  35 %  ???    06/23/18 1915  24  100 %  Ventilator  35 %  ???    06/23/18 1900  17  100 %  Ventilator  35 %  ???    06/23/18 1845  17  100 %  Ventilator  35 %  ???    06/23/18 1830  21  100 %  Ventilator  35 %  ???  06/23/18 1815  19  100 %  Ventilator  35 %  ???    06/23/18 1800  22  100 %  Ventilator  35 %  ???    06/23/18 1756  22  100 %  Ventilator  35 %  ???    06/23/18 1740  22  ???  Ventilator  ???  ???        Oxygen Connected to Wall:  yes    Pre-Hemodialysis Assessment     Date/Time Therapy Number Dialyzer All Psychologist, counselling Dialysis Flow (mL/min)    06/23/18 1945  ???  ???  ???  ???  ???    06/23/18 1741  3  F-180 (98 mLs)  Yes  Engaged  800 mL/min    Date/Time Verify Priming Solution Priming Volume Hemodialysis Independent pH Hemodialysis Machine Conductivity (mS/cm) Hemodialysis Independent Conductivity (mS/cm)    06/23/18 1945  ???  ???  ???  ???  ???    06/23/18 1741  0.9% NS  300 mL  7.1  13.7 mS/cm  13.9 mS/cm    Date/Time Bicarb Conductivity Residual Bleach Negative Free Chlorine Total Chlorine Chloramine    06/23/18 1945 --  Yes --  0 during treatment --    06/23/18 1741 --  Yes --  0 --        Pre-Hemodialysis Treatment Comments     Date/Time Pre-Hemodialysis Comments    06/23/18 1741  pt alert, responsive to verbal         Hemodialysis Treatment     Date/Time Blood Flow Rate (mL/min) Arterial Pressure (mmHg) Venous Pressure (mmHg) Transmembrane Pressure (mmHg)    06/23/18 2130  ???  ???  ???  ???    06/23/18 2115  400 mL/min  -130 mmHg  160 mmHg  10 mmHg    06/23/18 2100  400 mL/min  -130 mmHg  160 mmHg  10 mmHg    06/23/18 2045  400 mL/min  -130 mmHg  160 mmHg  10 mmHg    06/23/18 2030  400 mL/min  -120 mmHg  160 mmHg  10 mmHg    06/23/18 2015  400 mL/min  -120 mmHg  150 mmHg  10 mmHg    06/23/18 2000  400 mL/min  -120 mmHg  160 mmHg  10 mmHg    06/23/18 1945  400 mL/min  -120 mmHg  160 mmHg  10 mmHg    06/23/18 1930  400 mL/min  -120 mmHg  170 mmHg  10 mmHg    06/23/18 1915  400 mL/min  -120 mmHg  160 mmHg  10 mmHg    06/23/18 1900  400 mL/min  -120 mmHg  170 mmHg  10 mmHg    06/23/18 1845  400 mL/min  -120 mmHg  160 mmHg  10 mmHg    06/23/18 1830  400 mL/min  -120 mmHg  150 mmHg  10 mmHg    06/23/18 1815  400 mL/min  -180 mmHg  100 mmHg  20 mmHg    06/23/18 1800  400 mL/min  -170 mmHg  100 mmHg  20 mmHg    06/23/18 1756  500 mL/min  -220 mmHg  120 mmHg  10 mmHg    Date/Time Ultrafiltration Rate (mL/hr) Ultrafiltrate Removed (mL) Dialysate Flow Rate (mL/min) KECN Linna Caprice)    06/23/18 2130  ???  2600 mL  ???  ???    06/23/18 2115  740 mL/hr  2457 mL  800 ml/min  ???    06/23/18 2100  740 mL/hr  2280 mL  800 ml/min  ???    06/23/18 2045  740 mL/hr  2078 mL  800 ml/min  ???    06/23/18 2030  740 mL/hr  1917 mL  800 ml/min  ???    06/23/18 2015  740 mL/hr  1745 mL  800 ml/min  ???    06/23/18 2000  740 mL/hr  1533 mL  800 ml/min  ???    06/23/18 1945  740 mL/hr  1343 mL  800 ml/min  ???    06/23/18 1930  740 mL/hr  1172 mL  800 ml/min  ???    06/23/18 1915  740 mL/hr  1060 mL  800 ml/min  ???    06/23/18 1900  740 mL/hr  799 mL  800 ml/min  ???    06/23/18 1845  740 mL/hr  609 mL  800 ml/min  ???    06/23/18 1830  740 mL/hr  524 mL  800 ml/min  ???    06/23/18 1815  740 mL/hr  312 mL  800 ml/min  ???    06/23/18 1800  740 mL/hr  123 mL  800 ml/min  ???    06/23/18 1756  740 mL/hr  2 mL  800 ml/min  ???        Hemodialysis Treatment Comments     Date/Time Intra-Hemodialysis Comments    06/23/18 2134  post-rinse back    06/23/18 2130  HD completed. Blood returned. Net UF 2L.    06/23/18 2115  pt appears comfortable    06/23/18 2100  stable    06/23/18 2045  pt feeling cold - covered with blanket    06/23/18 2030  treatment in progress    06/23/18 2015  RT at bedside; vs stable    06/23/18 2000  pt needing suctioning PRN c/o primary RN    06/23/18 1945  pt awake, appears calm and comfortable    06/23/18 1940  Received report from [place]; assumed care    06/23/18 1930  stable, resting, Dr Margaretmary Bayley in     06/23/18 1915  stable, resting     06/23/18 1900  stable, resting     06/23/18 1845  stable, resting    06/23/18 1830  stable, anixous, lines reversed ,     06/23/18 1815  stable, resting     06/23/18 1800  stable, resting     06/23/18 1756  dialysis started         Post Treatment     Date/Time Rinseback Volume (mL) On Line Clearance: spKt/V Total Liters Processed (L/min) Dialyzer Clearance    06/23/18 2131  300 mL  ???  79.8 L/min  Moderately streaked        Post Hemodialysis Treatment Comments     Date/Time Post-Hemodialysis Comments    06/23/18 2131  stable post-HD, alert        POST TREATMENT ASSESSMENT:  General appearance:  Alert; appears ill  Neurological:  Oriented x 4  Lungs:  Diminished breath sounds, intubated but awake  Hearts:  Regular rate and rhythm, BP and HR stable during HD  Abdomen:  Soft, non-tender  Skin:  Warm to touch, dry    Hemodialysis I/O     Date/Time Total Hemodialysis Replacement Volume (mL) Total Ultrafiltrate Output (mL)    06/23/18 2131  ???  2000 mL        [id number] - Medicaitons Given During Treatment  (last 4 hrs)  DARRYL E PERDUE, RRT       Medication Name Action Time Action Route Rate Dose User     chlorhexidine (PERIDEX) 0.12 % solution 5 mL 06/23/18 2022 Given Mouth  5 mL Darryl E Perdue, RRT          EDNA REALUBIT, RN       Medication Name Action Time Action Route Rate Dose User     MORPhine 4 mg/mL injection 2 mg 06/23/18 1835 Given Intravenous  2 mg Hollie Salk, RN          Francena Hanly, RN       Medication Name Action Time Action Route Rate Dose User     heparin (porcine) 1000 unit/mL injection 1,500 Units 06/23/18 2130 Given Intra-cannular  1,500 Units Francena Hanly, RN     heparin (porcine) 1000 unit/mL injection 1,700 Units 06/23/18 2130 Given Intra-cannular  1,700 Units Francena Hanly, RN

## 2018-06-24 NOTE — Unmapped (Signed)
HEMODIALYSIS INTRA-PROCEDURE NOTE    Patient Katherine Norton was seen and examined on hemodialysis.    CHIEF COMPLAINT:  Acute Kidney Disease    INTERVAL HISTORY:   Patient with multifocal pneumonia    PHYSICAL EXAM:  Vitals:  Temp:  [36.8 ??C (98.2 ??F)-37.4 ??C (99.3 ??F)] 37.4 ??C (99.3 ??F)  Heart Rate:  [72-105] 77  SpO2 Pulse:  [72-105] 78  MAP:  [93 mmHg-116 mmHg] 112 mmHg  A BP-2: (136-169)/(69-85) 168/80  MAP:  [93 mmHg-116 mmHg] 112 mmHg    Weights:  Admission Weight: 92.5 kg (203 lb 14.8 oz)  Last documented Weight: 84.2 kg (185 lb 10 oz)  Weight Change from Previous Day: -5.5 kg (-12 lb 2 oz)    Assessment:  General: Appearing anxious  Pulmonary: rhonchi  Cardiovascular: tachycardic  Extremities:  1+ edema      ACCESS:       LAB DATA:  Lab Results   Component Value Date    NA 136 06/23/2018    NA 134 (L) 06/23/2018    NA 139 06/19/2018    K 3.9 06/23/2018    K 3.6 06/23/2018    K 3.9 06/19/2018    CL 107 06/23/2018    CL 104 04/05/2018    BUN 13 06/23/2018    BUN 30 (H) 04/05/2018    CREATININE 2.92 (H) 06/23/2018    CREATININE 2.32 (H) 04/05/2018     Lab Results   Component Value Date    HGB 8.3 (L) 06/23/2018    HGB 7.4 (L) 06/19/2018    HCT 25.1 (L) 06/23/2018    HCT 30.8 (L) 04/05/2018    WBC 2.1 (L) 06/23/2018    WBC 3.3 (L) 04/05/2018     Lab Results   Component Value Date    PHOS 3.4 06/23/2018    PHOS 4.5 12/04/2014       PLAN:  Ultrafiltration Goal:  2 L

## 2018-06-24 NOTE — Unmapped (Signed)
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

## 2018-06-24 NOTE — Unmapped (Signed)
Pt remains on the vent. ETT secure at the lip. BSS coarse. Suctioned for small amounts of thick tan secretions. No vent changes this shift. Will continue to monitor. Pt has been refusing metaneb as she complains of not being able to breathe. She becomes very anxious.

## 2018-06-24 NOTE — Unmapped (Signed)
Dialysis treatment for 3.5hrs for 2kgs

## 2018-06-24 NOTE — Unmapped (Signed)
Remains vented ,no changes . Pt getting morphine for anxiety /agitation . Pt happy when she get her anti anxiety drugs . Continue plan of care the patient for Trach on Tuesday .  Problem: Adult Inpatient Plan of Care  Goal: Plan of Care Review  Outcome: Progressing  Goal: Patient-Specific Goal (Individualization)  Outcome: Progressing  Goal: Absence of Hospital-Acquired Illness or Injury  Outcome: Progressing  Goal: Optimal Comfort and Wellbeing  Outcome: Progressing  Goal: Readiness for Transition of Care  Outcome: Progressing  Goal: Rounds/Family Conference  Outcome: Progressing     Problem: Latex Allergy  Goal: Absence of Allergy Symptoms  Outcome: Progressing     Problem: Skin Injury Risk Increased  Goal: Skin Health and Integrity  Outcome: Progressing     Problem: Fall Injury Risk  Goal: Absence of Fall and Fall-Related Injury  Outcome: Progressing     Problem: Self-Care Deficit  Goal: Improved Ability to Complete Activities of Daily Living  Outcome: Progressing     Problem: Hypertension Comorbidity  Goal: Blood Pressure in Desired Range  Outcome: Progressing     Problem: Pain Chronic (Persistent) (Comorbidity Management)  Goal: Acceptable Pain Control and Functional Ability  Outcome: Progressing     Problem: Fluid Imbalance (Pneumonia)  Goal: Fluid Balance  Outcome: Progressing     Problem: Infection (Pneumonia)  Goal: Resolution of Infection Signs/Symptoms  Outcome: Progressing     Problem: Respiratory Compromise (Pneumonia)  Goal: Effective Oxygenation and Ventilation  Outcome: Progressing     Problem: Device-Related Complication Risk (Hemodialysis)  Goal: Safe, Effective Therapy Delivery  Outcome: Progressing     Problem: Hemodynamic Instability (Hemodialysis)  Goal: Vital Signs Remain in Desired Range  Outcome: Progressing     Problem: Infection (Hemodialysis)  Goal: Absence of Infection Signs/Symptoms  Outcome: Progressing     Problem: Communication Impairment (Mechanical Ventilation, Invasive)  Goal: Effective Communication  Outcome: Progressing     Problem: Noninvasive Ventilation Acute  Goal: Effective Unassisted Ventilation and Oxygenation  Outcome: Progressing

## 2018-06-24 NOTE — Unmapped (Signed)
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 She was transferred to MICU the following day for vent mngmt since there wasn't any evid of perf per gen surg.     24hr events: intmt anxiety, o/w events overnight    Dispo: ICU    Neurological   #acute encephalopathy: resolved    #acute on chronic pain, anxiety  -cont lidoderm patch to back and tylenol prn to minimize narc  -  oxy to 5 q 6 PRN + PRN morphine for breakthrough  - melatonin for sleep    Pulmonary   #acute hypoxic respiratory failure likely due to multifocal PNA, ARDS.  Intubated on 8/1 at OSH CT chest with Scattered airspace opacities in the left apex, left upper lobe and superior segment of left lower lobe; concerning for pneumonia.  Extubated 8/12 and reint 8/13 for progressive hypoxia and inc WOB .  Extubated 8/25.  Reintubated on 8/28 am after R sided plug/collpase.    - tolerating PRVC well, cont wean as tol  - peridex for vap ppx  - aggressive pulm toilet as tol  - hypertonic nebs  - optimal PEEP 14 per esophogeal balloon eval (8/14) .  Consulted ENT, but unable to trach until PEEP < 10  And PEEP down to 5 ENT re-consulted.     - posted for trach 9/2 in OR w ENT at 0745    Cardiovascular   #acute HFrEF  #h/o hypertension - hold home metoprolol and amlodipine   - prn hydralazine  - Echo 7/29: EF 65-70%, mod pulm HTN and borderline dilated RV   - repeat echo 8/16 EF >55%, severe RV fx and dilated RA, severe phtn, mod TR    #Hypotension, resolved:  - developed pressor requirement 8/15  - previously on norepi gtt for MAP>65, currently off since 8/22    Renal   #Acute on chronic renal failure  - Cr baseline appears to be ~1.7--> 3-->2.87  - nephrology consulted 8/16, appreciate recs >> CRRT started 8/22, clotted off 8/25. First iHD on 8/27 with 2.5 L removed.  Tolerated well.    - foley removed 8/27, consider bladder scan daily  - last iHD tolerated well for 2L UF, next after OR 9/2    Infectious Disease/Autoimmune   #septic shock due to ? PNA.  Initially seen at OSH and had c/f perforated duodenal ulcer, but CT scan here non concerning.  CT chest with possible multifocal PNA.    - appreciate ID's assistance. ID signed off 8/17  - WOCN consulted for intertrigo and sacral wound (see recs 8/19)  - last Blood/urine/sputum cxs sent 8/21, abx given x 1 dose    Abx:   Cefepime (8/22-  8/22)  Fluconazole (8/22- 8/22)  Vanco (8/11-->8/12)  Mica (8/11-->8/12)  Flagyl (8/10-->8/14)  Cefepime  (8/10-->8/16)  Voriconazole (8/12-->8/15)  Linezolid (8/15-8/16)    Abx: OSH  ceftaroline and flagyl  levaquin  meropenem     Cx OSH:  - 7/29 Strep pneumo Ag neg, RVP neg, urine Legionella Ag neg  - 7/29 bd cx NGTD  OSH: Fungal, pneumocystis/histoplasma, AFB, Gram on BAL all were negative.    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      Malnutrition Assessment: Not done yet.  Patient does not meet AND/ASPEN criteria for malnutrition at this time (06/13/18 1522)  - previously had FMS for liquid stools, output reduced and removed      Heme/Coag   #Critical illness anemia  - last transfusion 2 units PRBC 8/20-8/21 and 1unit 8/26, no s/s of bleeding  - SCDs and heparin for DVT ppx    Endocrine   #Hypoglycemia  - cont accu check while TF on hold  - glucose within target range yes      Prophylaxis/LDA/Restraints/Consults   Can CVC be removed? No: dialysis catheter   Can A-line be removed? No: inadequate non-invasive pressure monitoring  Can Foley be removed? N/A, no Foley present  Mobility plan: Step 3 - Bed in chair position    Feeding: Tube feeds at goal  Analgesia: Pain adequately controlled  Sedation SAT/SBT: Yes  Thrombembolic ppx: SQ heparin  Head of bed >30 degrees: Yes  Ulcer ppx: Yes, home use continued  Glucose within target range: Yes, in range    RASS at goal? N/A, not on sedation  [place] Agitation Assessment Scale (RASS) : +1 (06/24/2018 12:00 PM)     Can antipsychotics be stopped? N/A, not on antipsychotics  CAM-ICU Result: Positive (06/24/2018 12:00 PM)      Patient Lines/Drains/Airways Status    Active Active Lines, Drains, & Airways     Name:   Placement date:   Placement time:   Site:   Days:    ETT  7.5   06/20/18    0914     4    CVC Triple Lumen 06/03/18 Non-tunneled Right Internal jugular   06/03/18    1503    Internal jugular   20    NG/OG Tube Right nostril   06/02/18    2121    Right nostril   21    Arterial Line 06/03/18 Right Radial   06/03/18    0007    Radial   21    Hemodialysis Catheter 06/14/18 Left Internal jugular   06/14/18    1400    Internal jugular   9              Patient Lines/Drains/Airways Status    Active Wounds     Name:   Placement date:   Placement time:   Site:   Days:    Wound 06/08/18 Pressure Injury Buttocks Posterior pink Stage 2   06/08/18    0001    Buttocks   16  Wound 06/09/18 Rash/Dermititis Perineum   06/09/18    2154    Perineum   14    Wound 06/18/18 Abdomen Lower 06/18/18    1102    Abdomen   6    Wound 06/18/18 Rash/Dermititis Breast Left;Lower   06/18/18    1108    Breast   6                Goals of Care     Code Status: Full Code    Designated Healthcare Decision Maker:  Ms. Harb currently lacks decisional capacity for healthcare decision-making and is unable to designate a surrogate healthcare decision maker. Ms. Welshans designated healthcare decision maker(s) is/are mei suits and [name] (the patient's adult child) as denoted by hospital policy for patients without a known preference.      Subjective   Katherine Norton is a 62 y.o. 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 Patient initially presented to the OSH on 05/20/18 due to acute worsening shortness of breath and altered mental status. Patient was sating 68% on RA, but 90% on CPAP. Patient had severe acidemia on ABG as well as acute on chronic renal failure with Cr of 3.89. CXR at that time showed consolidation with concern for pneumonia and was febrile. Patient's initial lactate was 2.70. Patient was started on IV ceftaroline and flagyl initially.      Objective     Vitals - past 24 hours  Temp:  [37 ??C (98.6 ??F)-37.4 ??C (99.3 ??F)] 37.2 ??C (99 ??F)  Heart Rate:  [72-120] 94  SpO2 Pulse:  [72-120] 94  Resp:  [13-26] 22  FiO2 (%):  [35 %] 35 %  SpO2:  [98 %-100 %] 99 % Intake/Output  I/O last 3 completed shifts:  In: 2605 [I.V.:400; NG/GT:2205]  Out: 2000 [Other:2000]     Physical Exam:    General: obese female, A&O person, place, year, nods y/n, mouths words   HEENT: PERRLA  CV: RRR, no mrg  Pulm: coarse bs bilaterally  GI: soft, distended, intmt tender on palaption  MSK: no edema  Skin: intact x intertrigo under bilateral breasts, panniculus, and perineal area  Neuro: awake, FSC, gen weakness, all ext 4-/5      Continuous Infusions:   ??? dextrose 5 % and sodium chloride 0.9 % Stopped (06/23/18 0800)       Scheduled Medications:   ??? calcitonin (salmon)  1 spray Alternating Nares Daily (RT)   ??? chlorhexidine  5 mL Mouth BID   ??? cycloSPORINE  75 mg NG tube BID   ??? famotidine  20 mg NG tube Daily   ??? heparin (porcine) for subcutaneous use  5,000 Units Subcutaneous [facility]   ??? insulin lispro  0-12 Units Subcutaneous Q6H [facility]   ??? lidocaine  1 patch Transdermal Daily   ??? melatonin  3 mg Oral Nightly   ??? polyethylene glycol  17 g Oral BID   ??? senna  1 tablet Oral Nightly   ??? sevelamer  800 mg NG tube 3xd Meals   ??? sodium chloride  4 mL Nebulization TID (RT)       PRN medications: acetaminophen, albuterol, dextrose, dextrose 5 % and sodium chloride 0.9 %, heparin (porcine), heparin (porcine), MORPhine injection, ondansetron, oxyCODONE      Data/Imaging Review: Reviewed in Epic and personally interpreted on 06/24/2018. See EMR for detailed  results.      Critical Care Attestation     This patient is critically ill or injured with the impairment of vital organ systems such that there is a high probability of imminent or life threatening deterioration in the patient's condition. This patient must remain in the ICU for ongoing evaluation of the comprehensive management plan outlined in this note. I directly provided critical care services as documented in this note and the critical care time spent (30 min) is exclusive of separately billable procedures.    Rayland Hamed Fonnie Mu, ACNP

## 2018-06-25 LAB — COMPREHENSIVE METABOLIC PANEL
ALBUMIN: 2.7 g/dL — ABNORMAL LOW (ref 3.5–5.0)
ALKALINE PHOSPHATASE: 83 U/L (ref 38–126)
ALT (SGPT): <8 U/L — ABNORMAL LOW (ref 15–48)
ANION GAP: 5 mmol/L — ABNORMAL LOW (ref 9–15)
AST (SGOT): 19 U/L (ref 14–38)
BLOOD UREA NITROGEN: 20 mg/dL (ref 7–21)
BUN / CREAT RATIO: 7
CALCIUM: 8.4 mg/dL — ABNORMAL LOW (ref 8.5–10.2)
CHLORIDE: 103 mmol/L (ref 98–107)
CO2: 26 mmol/L (ref 22.0–30.0)
CREATININE: 2.93 mg/dL — ABNORMAL HIGH (ref 0.60–1.00)
EGFR CKD-EPI AA FEMALE: 19 mL/min/{1.73_m2} — ABNORMAL LOW (ref >=60)
EGFR CKD-EPI NON-AA FEMALE: 16 mL/min/{1.73_m2} — ABNORMAL LOW (ref >=60)
GLUCOSE RANDOM: 97 mg/dL (ref 65–179)
PROTEIN TOTAL: 7.2 g/dL (ref 6.5–8.3)
SODIUM: 134 mmol/L — ABNORMAL LOW (ref 135–145)

## 2018-06-25 LAB — CBC
HEMATOCRIT: 25.2 % — ABNORMAL LOW (ref 36.0–46.0)
HEMOGLOBIN: 8.3 g/dL — ABNORMAL LOW (ref 12.0–16.0)
MEAN CORPUSCULAR HEMOGLOBIN: 31.1 pg (ref 26.0–34.0)
MEAN CORPUSCULAR VOLUME: 94.5 fL (ref 80.0–100.0)
MEAN PLATELET VOLUME: 9.5 fL (ref 7.0–10.0)
PLATELET COUNT: 183 10*9/L (ref 150–440)
RED BLOOD CELL COUNT: 2.67 10*12/L — ABNORMAL LOW (ref 4.00–5.20)
RED CELL DISTRIBUTION WIDTH: 15.8 % — ABNORMAL HIGH (ref 12.0–15.0)
WBC ADJUSTED: 2 10*9/L — ABNORMAL LOW (ref 4.5–11.0)

## 2018-06-25 LAB — PHOSPHORUS: Phosphate:MCnc:Pt:Ser/Plas:Qn:: 3.1

## 2018-06-25 LAB — PROTIME-INR: INR: 1.08

## 2018-06-25 LAB — SODIUM: Sodium:SCnc:Pt:Ser/Plas:Qn:: 134 — ABNORMAL LOW

## 2018-06-25 LAB — INR: Lab: 1.08

## 2018-06-25 LAB — APTT
APTT: 30.1 s (ref 25.9–39.5)
Coagulation surface induced:Time:Pt:PPP:Qn:Coag: 30.1

## 2018-06-25 LAB — MAGNESIUM: Magnesium:MCnc:Pt:Ser/Plas:Qn:: 2.2

## 2018-06-25 LAB — MEAN CORPUSCULAR HEMOGLOBIN: Lab: 31.1

## 2018-06-25 LAB — CYCLOSPORINE (FPIA) BLOOD: Lab: 49

## 2018-06-25 NOTE — Unmapped (Signed)
Patient continues to be on PRVC 300/RR22/PEEP5/35%. No complications from patient and patient is not in distress.

## 2018-06-25 NOTE — Unmapped (Signed)
Pt AxOx4, nods appropriately to questions and follows commands. Pain well controlled with PRN medications. Pt on ventilator, lung fields clear and diminished. Pt presents NSR/ST on monitor, blood pressure normal. Pt anuric, receiving iHD. Pt not tolerating TF goal, dt pain and nausea.    Problem: Adult Inpatient Plan of Care  Goal: Plan of Care Review  Outcome: Progressing  Goal: Patient-Specific Goal (Individualization)  Outcome: Progressing  Goal: Absence of Hospital-Acquired Illness or Injury  Outcome: Progressing  Goal: Optimal Comfort and Wellbeing  Outcome: Progressing  Goal: Readiness for Transition of Care  Outcome: Progressing  Goal: Rounds/Family Conference  Outcome: Progressing     Problem: Latex Allergy  Goal: Absence of Allergy Symptoms  Outcome: Progressing     Problem: Skin Injury Risk Increased  Goal: Skin Health and Integrity  Outcome: Progressing     Problem: Fall Injury Risk  Goal: Absence of Fall and Fall-Related Injury  Outcome: Progressing     Problem: Self-Care Deficit  Goal: Improved Ability to Complete Activities of Daily Living  Outcome: Progressing     Problem: Hypertension Comorbidity  Goal: Blood Pressure in Desired Range  Outcome: Progressing     Problem: Pain Chronic (Persistent) (Comorbidity Management)  Goal: Acceptable Pain Control and Functional Ability  Outcome: Progressing     Problem: Fluid Imbalance (Pneumonia)  Goal: Fluid Balance  Outcome: Progressing     Problem: Infection (Pneumonia)  Goal: Resolution of Infection Signs/Symptoms  Outcome: Progressing     Problem: Respiratory Compromise (Pneumonia)  Goal: Effective Oxygenation and Ventilation  Outcome: Progressing     Problem: Device-Related Complication Risk (Hemodialysis)  Goal: Safe, Effective Therapy Delivery  Outcome: Progressing     Problem: Hemodynamic Instability (Hemodialysis)  Goal: Vital Signs Remain in Desired Range  Outcome: Progressing     Problem: Infection (Hemodialysis)  Goal: Absence of Infection Signs/Symptoms  Outcome: Progressing     Problem: Communication Impairment (Mechanical Ventilation, Invasive)  Goal: Effective Communication  Outcome: Progressing     Problem: Noninvasive Ventilation Acute  Goal: Effective Unassisted Ventilation and Oxygenation  Outcome: Progressing

## 2018-06-25 NOTE — Unmapped (Signed)
Cyclosporine Therapeutic Monitoring Pharmacy Note    Katherine Norton is a 62 y.o. female continuing cyclosporine NEORAL.     Indication: Liver transplant     Date of Transplant: 05/14/18     Prior Dosing Information: Current regimen cyclosporine 75 mg PO BID      Goals:  Therapeutic Drug Levels  Cyclosporine trough goal: 50-100 ng/mL    Additional Clinical Monitoring/Outcomes  ? Monitor renal function (SCr and urine output) and liver function (LFTs)  ? Monitor for signs/symptoms of adverse events (e.g., hyperglycemia, hyperkalemia, hypomagnesemia, hypertension, headache, tremor)    Results:   Cyclosporine level: 49 ng/mL, drawn appropriately    Pharmacokinetic Considerations and Significant Drug Interactions:  ? Concurrent hepatotoxic medications: None identified  ? Concurrent CYP3A4 substrates/inhibitors: None identified  ? Concurrent nephrotoxic medications: None identified    Assessment/Plan:  Recommendation(s)  ? Continue current regimen of cyclosporine 75 mg PO BID    Follow-up  ? Continue Mon/Thurs level checks. .   ? A pharmacist will continue to monitor and recommend levels as appropriate    Please page service pharmacist with questions/clarifications.    Lucienne Capers, PharmD

## 2018-06-25 NOTE — Unmapped (Signed)
Patient is currently on the ventilator and settings have not changed.

## 2018-06-25 NOTE — Unmapped (Signed)
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 female with  PMHx of liver transplant (1999) on immunosuppresion, chronic back pain, anxiety, HTN, CKD,  that presents to [facility] as a transfer from [facility] on 8/10, with respiratory failure and possible contained duodenal perforation. She was transferred to MICU the following day for vent mngmt since there wasn't any evid of perf per gen surg.     24hr events: no events over night but did saturate pure wick so having SOME urine output    Dispo: ICU    Neurological   #acute encephalopathy: resolved    #acute on chronic pain, anxiety  -cont lidoderm patch to back and tylenol prn to minimize narc  -  oxy to 5 q 6 PRN + PRN morphine for breakthrough  - melatonin for sleep    Pulmonary   #acute hypoxic respiratory failure likely due to multifocal PNA, ARDS.  Intubated on 8/1 at OSH CT chest with Scattered airspace opacities in the left apex, left upper lobe and superior segment of left lower lobe; concerning for pneumonia.  Extubated 8/12 and reint 8/13 for progressive hypoxia and inc WOB .  Extubated 8/25.  Reintubated on 8/28 am after R sided plug/collapse.  Now to have trach    - tolerating PRVC well, cont wean as tol.  Passed SBT this am  - peridex for vap ppx  - aggressive pulm toilet as tol  - hypertonic nebs  - optimal PEEP 14 per esophogeal balloon eval (8/14) .  Consulted ENT, but unable to trach until PEEP < 10  And PEEP down to 5 ENT re-consulted and sats/pO2 good. - posted for trach 9/2 in OR w ENT at 0745    Cardiovascular   #acute HFrEF  #h/o hypertension   - hold home metoprolol and amlodipine   - prn hydralazine  - Echo 7/29: EF 65-70%, mod pulm HTN and borderline dilated RV   - repeat echo 8/16 EF >55%, severe RV fx and dilated RA, severe phtn, mod TR    #Hypotension, resolved:  - developed pressor requirement 8/15  - previously on norepi gtt for MAP>65, currently off since 8/22    Renal   #Acute on chronic renal failure  - Cr baseline appears to be ~1.7--> 3-->2.87  - nephrology consulted 8/16, appreciate recs >> CRRT started 8/22, clotted off 8/25. First iHD on 8/27 with 2.5 L removed.  Tolerated well.      - foley removed 8/27, consider bladder scan daily  - last iHD tolerated well for 2L UF, next after OR 9/2.  Needs jet lock after HD     Infectious Disease/Autoimmune   #septic shock due to ? PNA.  Initially seen at OSH and had c/f perforated duodenal ulcer, but CT scan here non concerning.  CT chest with possible multifocal PNA.    - appreciate ID's assistance. ID signed off 8/17  - WOCN  consulted for intertrigo and sacral wound (see recs 8/19)  - last Blood/urine/sputum cxs sent 8/21, abx given x 1 dose    Abx:   Cefepime (8/22- 8/22)  Fluconazole (8/22- 8/22)  Vanco (8/11-->8/12)  Mica (8/11-->8/12)  Flagyl (8/10-->8/14)  Cefepime  (8/10-->8/16)  Voriconazole (8/12-->8/15)  Linezolid (8/15-8/16)    Abx: OSH  ceftaroline and flagyl  levaquin  meropenem     Cx OSH:  - 7/29 Strep pneumo Ag neg, RVP neg, urine Legionella Ag neg  - 7/29 bd cx NGTD  OSH: Fungal, pneumocystis/histoplasma, AFB, Gram on BAL all were negative.    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      Malnutrition Assessment: Not done yet.  Patient does not meet AND/ASPEN criteria for malnutrition at this time (06/13/18 1522)  - previously had FMS for liquid stools, output reduced and removed  - NPO after MN for OR tomorrow      Heme/Coag   #Critical illness anemia  - last transfusion 2 units PRBC 8/20-8/21 and 1unit 8/26, no s/s of bleeding  - SCDs and heparin for DVT ppx  - coags normal this am; stable for OR    Endocrine   #Hypoglycemia  - cont accu check while TF on hold  - glucose within target range yes      Prophylaxis/LDA/Restraints/Consults   Can CVC be removed? No: dialysis catheter   Can A-line be removed? No: inadequate non-invasive pressure monitoring  Can Foley be removed? N/A, no Foley present  Mobility plan: Step 3 - Bed in chair position, PT consulted (for tomorrow)    Feeding: Tube feeds at goal  Analgesia: Pain adequately controlled  Sedation SAT/SBT: Yes  Thrombembolic ppx: SQ heparin  Head of bed >30 degrees: Yes  Ulcer ppx: Yes, home use continued  Glucose within target range: Yes, in range    RASS at goal? N/A, not on sedation  [place] Agitation Assessment Scale (RASS) : -1 (06/25/2018  2:00 PM)     Can antipsychotics be stopped? N/A, not on antipsychotics  CAM-ICU Result: Negative (06/25/2018  8:00 AM)      Patient Lines/Drains/Airways Status    Active Active Lines, Drains, & Airways     Name:   Placement date:   Placement time:   Site:   Days:    ETT  7.5   06/20/18    0914     5    CVC Triple Lumen 06/03/18 Non-tunneled Right Internal jugular   06/03/18    1503    Internal jugular   22    NG/OG Tube Right nostril   06/02/18    2121    Right nostril   22    Arterial Line 06/03/18 Right Radial   06/03/18    0007    Radial   22    Hemodialysis Catheter 06/14/18 Left Internal jugular   06/14/18    1400    Internal jugular   11              Patient Lines/Drains/Airways Status  Active Wounds     Name:   Placement date:   Placement time:   Site: Days:    Wound 06/08/18 Pressure Injury Buttocks Posterior pink Stage 2   06/08/18    0001    Buttocks   17    Wound 06/09/18 Rash/Dermititis Perineum   06/09/18    2154    Perineum   15    Wound 06/18/18 Abdomen Lower   06/18/18    1102    Abdomen   7    Wound 06/18/18 Rash/Dermititis Breast Left;Lower   06/18/18    1108    Breast   7                Goals of Care     Code Status: Full Code    Designated Healthcare Decision Maker:  Katherine Norton currently lacks decisional capacity for healthcare decision-making and is unable to designate a surrogate healthcare decision maker. Katherine Norton designated healthcare decision maker(s) is/are halle davlin and [name] (the patient's adult child) as denoted by hospital policy for patients without a known preference.      Subjective   Katherine Norton is a 62 y.o. female with PMHx of liver transplant (1999) on immunosuppresion, chronic back pain, anxiety, HTN, CKD,  that presents to [facility] as a transfer from [facility]. Patient initially presented to the OSH on 05/20/18 due to acute worsening shortness of breath and altered mental status. Patient was sating 68% on RA, but 90% on CPAP. Patient had severe acidemia on ABG as well as acute on chronic renal failure with Cr of 3.89. CXR at that time showed consolidation with concern for pneumonia and was febrile. Patient's initial lactate was 2.70. Patient was started on IV ceftaroline and flagyl initially.      Objective     Vitals - past 24 hours  Temp:  [36.8 ??C (98.3 ??F)-37.5 ??C (99.5 ??F)] 37.1 ??C (98.8 ??F)  Heart Rate:  [69-102] 73  SpO2 Pulse:  [69-101] 73  Resp:  [0-25] 23  FiO2 (%):  [30 %-35 %] 30 %  SpO2:  [97 %-100 %] 99 % Intake/Output  I/O last 3 completed shifts:  In: 2161 [P.O.:1; NG/GT:2160]  Out: 2300 [Urine:300; Other:2000]     Physical Exam:    General: obese female, A&O person, place, year, nods y/n, mouths words   HEENT: PERRLA  CV: RRR, no mrg  Pulm: coarse bs bilaterally  GI: soft, distended, intmt tender on palapation  MSK: no edema  Skin: intact x intertrigo under bilateral breasts, panniculus, and perineal area  Neuro: awake, FSC, gen weakness, all ext 4-/5      Continuous Infusions:   ??? dextrose 5 % and sodium chloride 0.9 % Stopped (06/23/18 0800)       Scheduled Medications:   ??? calcitonin (salmon)  1 spray Alternating Nares Daily (RT)   ??? chlorhexidine  5 mL Mouth BID   ??? cycloSPORINE  75 mg NG tube BID   ??? famotidine  20 mg NG tube Daily   ??? heparin (porcine) for subcutaneous use  5,000 Units Subcutaneous [facility]   ??? insulin lispro  0-12 Units Subcutaneous Q6H [facility]   ??? lidocaine  1 patch Transdermal Daily   ??? melatonin  3 mg Oral Nightly   ??? polyethylene glycol  17 g Oral BID   ??? senna  1 tablet Oral Nightly   ??? sevelamer  800 mg NG tube 3xd Meals   ??? sodium chloride  4 mL Nebulization TID (RT)       PRN medications: acetaminophen, albuterol, dextrose, dextrose 5 % and sodium chloride 0.9 %, heparin (porcine), heparin (porcine), MORPhine injection, ondansetron, oxyCODONE      Data/Imaging Review: Reviewed in Epic and personally interpreted on 06/25/2018. See EMR for detailed results.      Critical Care Attestation     This patient is critically ill or injured with the impairment of vital organ systems such that there is a high probability of imminent or life threatening deterioration in the patient's condition. This patient must remain in the ICU for ongoing evaluation of the comprehensive management plan outlined in this note. I directly provided critical care services as documented in this note and the critical care time spent (30 min) is exclusive of separately billable procedures.    Denny Levy, MD

## 2018-06-26 DIAGNOSIS — J9601 Acute respiratory failure with hypoxia: Principal | ICD-10-CM

## 2018-06-26 LAB — COMPREHENSIVE METABOLIC PANEL
ALBUMIN: 2.6 g/dL — ABNORMAL LOW (ref 3.5–5.0)
ALKALINE PHOSPHATASE: 77 U/L (ref 38–126)
ALT (SGPT): 8 U/L — ABNORMAL LOW (ref 15–48)
ANION GAP: 7 mmol/L — ABNORMAL LOW (ref 9–15)
AST (SGOT): 18 U/L (ref 14–38)
BILIRUBIN TOTAL: 0.5 mg/dL (ref 0.0–1.2)
BLOOD UREA NITROGEN: 29 mg/dL — ABNORMAL HIGH (ref 7–21)
BUN / CREAT RATIO: 8
CALCIUM: 8.5 mg/dL (ref 8.5–10.2)
CHLORIDE: 102 mmol/L (ref 98–107)
CO2: 24 mmol/L (ref 22.0–30.0)
CREATININE: 3.51 mg/dL — ABNORMAL HIGH (ref 0.60–1.00)
EGFR CKD-EPI AA FEMALE: 15 mL/min/{1.73_m2} — ABNORMAL LOW (ref >=60)
EGFR CKD-EPI NON-AA FEMALE: 13 mL/min/{1.73_m2} — ABNORMAL LOW (ref >=60)
GLUCOSE RANDOM: 72 mg/dL (ref 65–179)
SODIUM: 133 mmol/L — ABNORMAL LOW (ref 135–145)

## 2018-06-26 LAB — BLOOD GAS, ARTERIAL
BASE EXCESS ARTERIAL: 0.4 (ref -2.0–2.0)
HCO3 ARTERIAL: 25 mmol/L (ref 22–27)
O2 SATURATION ARTERIAL: 97.4 % (ref 94.0–100.0)
PCO2 ARTERIAL: 39.4 mmHg (ref 35.0–45.0)
PH ARTERIAL: 7.41 (ref 7.35–7.45)
PO2 ARTERIAL: 86.2 mmHg (ref 80.0–110.0)

## 2018-06-26 LAB — CBC
HEMATOCRIT: 23.7 % — ABNORMAL LOW (ref 36.0–46.0)
HEMOGLOBIN: 7.8 g/dL — ABNORMAL LOW (ref 12.0–16.0)
MEAN CORPUSCULAR HEMOGLOBIN CONC: 32.8 g/dL (ref 31.0–37.0)
MEAN CORPUSCULAR VOLUME: 93.6 fL (ref 80.0–100.0)
PLATELET COUNT: 180 10*9/L (ref 150–440)
RED BLOOD CELL COUNT: 2.54 10*12/L — ABNORMAL LOW (ref 4.00–5.20)
RED CELL DISTRIBUTION WIDTH: 15.7 % — ABNORMAL HIGH (ref 12.0–15.0)
WBC ADJUSTED: 1.9 10*9/L — ABNORMAL LOW (ref 4.5–11.0)

## 2018-06-26 LAB — MAGNESIUM: Magnesium:MCnc:Pt:Ser/Plas:Qn:: 2.2

## 2018-06-26 LAB — PLATELET COUNT: Lab: 180

## 2018-06-26 LAB — APTT
APTT: 30 s (ref 25.9–39.5)
HEPARIN CORRELATION: 0.2

## 2018-06-26 LAB — PROTIME: Lab: 11.8

## 2018-06-26 LAB — HEPARIN CORRELATION: Lab: 0.2

## 2018-06-26 LAB — EGFR CKD-EPI NON-AA FEMALE: Lab: 13 — ABNORMAL LOW

## 2018-06-26 LAB — PHOSPHORUS: Phosphate:MCnc:Pt:Ser/Plas:Qn:: 3.5

## 2018-06-26 LAB — PO2 ARTERIAL: Oxygen:PPres:Pt:BldA:Qn:: 86.2

## 2018-06-26 NOTE — Unmapped (Signed)
Dialysis treatment for 3.5hrs for 2kgs

## 2018-06-26 NOTE — Unmapped (Signed)
HEMODIALYSIS NURSE PROCEDURE NOTE    Treatment Number:  4 Room/Station:  Critical Care (Specify Unit & Room) Procedure Date:  06/26/18   Total Treatment Time:  2,925 Min.    CONSENT:  Written consent was obtained prior to the procedure and is detailed in the medical record. Prior to the start of the procedure, a time out was taken and the identity of the patient was confirmed via name, medical record number and date of birth.     WEIGHTS:  Hemodialysis Pre-Treatment Weights     Date/Time Pre-Treatment Weight (kg) Estimated Dry Weight (kg) Patient Goal Weight (kg) Total Goal Weight (kg)    06/26/18 1018  ??? no recorded weight due to 1) vent 2)s/p trach placement   ??? tbd  2 kg (4 lb 6.6 oz)  2.6 kg (5 lb 11.7 oz)           Hemodialysis Post Treatment Weights     Date/Time Post-Treatment Weight (kg) Treatment Weight Change (kg)    06/26/18 1423  ??? no weight due to on vent   ???        Active Dialysis Orders (168h ago, onward)     Start     Ordered    06/28/18 0700  Hemodialysis inpatient  Every Tue,Thu,Sat     Question Answer Comment   K+ 3 meq/L    Ca++ 2.5 meq/L    Bicarb 35 meq/L    Na+ 137 meq/L    Na+ Modeling none    Dialyzer F180NR    Dialysate Temperature (C) 36    BFR-As tolerated to a maximum of: 400 mL/min    DFR 800 mL/min    Duration of treatment 3.5 Hr    Dry weight (kg) TBD    Challenge dry weight (kg) NO    Fluid removal (L) 2L ON 9/3    Tubing Adult = 142 ml    Access Site Dialysis Catheter    Access Site Location Right    Keep SBP >: 90        06/25/18 2022    06/23/18 0700  Hemodialysis inpatient  Every Tue,Thu,Sat,   Status:  Canceled     Question Answer Comment   K+ 3 meq/L    Ca++ 2.5 meq/L    Bicarb 35 meq/L    Na+ 137 meq/L    Na+ Modeling none    Dialyzer [id number]    Dialysate Temperature (C) 36    BFR-As tolerated to a maximum of: 400 mL/min    DFR 800 mL/min    Duration of treatment 3.5 Hr    Dry weight (kg) TBD    Challenge dry weight (kg) NO    Fluid removal (L) 3L ON 8/29    Tubing Adult = 142 ml    Access Site Dialysis Catheter    Access Site Location Right    Keep SBP >: 90        06/21/18 0644              ACCESS SITE:       Hemodialysis Catheter 06/14/18 Left Internal jugular (Active)   Site Assessment Clean;Dry;Intact 06/26/2018 12:00 PM   Status Clamped 06/26/2018 12:00 PM   Dressing Intervention Dressing changed 06/23/2018  4:00 AM   Dressing Status      Clean;Dry;Intact/not removed 06/26/2018 12:00 PM   Verification by X-ray Yes 06/26/2018 12:00 PM   Site Condition No complications 06/26/2018 12:00 PM   Dressing Type Transparent;Antimicrobial dressing;Occlusive 06/26/2018  9:00  AM   Dressing Drainage Description Other (Comment) 06/23/2018  4:00 AM   Dressing Change Due 06/29/18 06/26/2018  9:00 AM   Line Necessity Reviewed? Y 06/26/2018  9:00 AM   Line Necessity Indications Yes - Hemodialysis 06/26/2018  9:00 AM   Line Necessity Reviewed With MDI 06/26/2018  9:00 AM           Catheter Fill Volumes:  Arterial:  1.5 mL Venous:  1.7 mL   Catheter filled with 1000 units Heparin post procedure.    Patient Lines/Drains/Airways Status    Active Peripheral & Central Intravenous Access     Name:   Placement date:   Placement time:   Site:   Days:    CVC Triple Lumen 06/03/18 Non-tunneled Right Internal jugular   06/03/18    1503    Internal jugular   22              LAB RESULTS:  Lab Results   Component Value Date    NA 133 (L) 06/26/2018    K 4.4 06/26/2018    CL 102 06/26/2018    CO2 24.0 06/26/2018    BUN 29 (H) 06/26/2018    CALCIUM 8.5 06/26/2018    CAION 4.63 06/23/2018    PHOS 3.5 06/26/2018    MG 2.2 06/26/2018    PTH 293.0 12/05/2017    TOPIRAMATE 12.2 12/05/2017    IRON 202 (H) 05/30/2013    LABIRON >100 (H) 05/30/2013    TRANSFERRIN 138 (L) 05/30/2013    FERRITIN 944 01/07/2010    TIBC 174 (L) 05/30/2013     Lab Results   Component Value Date    WBC 1.9 (L) 06/26/2018    HGB 7.8 (L) 06/26/2018    HCT 23.7 (L) 06/26/2018    PLT 180 06/26/2018    PHART 7.36 06/23/2018    PO2ART 91.5 06/23/2018    PCO2ART 46.4 (H) 06/23/2018    HCO3ART 25 06/23/2018    BEART 0.5 06/23/2018    O2SATART 97.2 06/23/2018    APTT 30.0 06/26/2018    HEPBSAG Negative 10/09/2014      VITAL SIGNS:  Temperature     Date/Time Temp Temp src      06/26/18 1404  36.9 ??C (98.4 ??F)  Axillary     06/26/18 1200  36.9 ??C (98.4 ??F)  Oral     06/26/18 1017  36.6 ??C (97.9 ??F)  Axillary     06/26/18 0900  36.6 ??C (97.9 ??F)  Axillary         Hemodynamics     Date/Time Pulse BP MAP (mmHg) Patient Position      06/26/18 1415  82  ???  ???  Lying     06/26/18 1411  87  ???  ???  Lying     06/26/18 1404  81  ???  ???  ???     06/26/18 1400  82  ???  ???  Lying     06/26/18 1345  85  ???  ???  Lying     06/26/18 1330  81  ???  ???  Lying     06/26/18 1315  87  ???  ???  Lying     06/26/18 1303  85  ???  ???  ???     06/26/18 1300  88  ???  ???  Lying     06/26/18 1245  96  ???  ???  Lying     06/26/18 1229  82  ???  ???  Lying     06/26/18 1215  81  ???  ???  Lying     06/26/18 1200  89  ???  ???  Lying     06/26/18 1145  76  ???  ???  Lying     06/26/18 1130  77  ???  ???  Lying     06/26/18 1115  70  ???  ???  Lying     06/26/18 1100  73  ???  ???  Lying     06/26/18 1045  74  ???  ???  Lying     06/26/18 1030  76  ???  ???  Lying     06/26/18 1017  76  ???  ???  Lying     06/26/18 1000  75  ???  ???  ???     06/26/18 0932  82  ???  ???  ???           Oxygen Therapy     Date/Time Resp SpO2 O2 Device O2 Flow Rate (L/min)    06/26/18 1415  22  100 %  Ventilator  ???    06/26/18 1411  22  99 %  Ventilator  ???    06/26/18 1404  20  100 %  ???  ???    06/26/18 1400  22  100 %  Ventilator  ???    06/26/18 1345  19  100 %  Ventilator  ???    06/26/18 1330  22  100 %  Ventilator  ???    06/26/18 1315  22  99 %  Ventilator  ???    06/26/18 1303  22  99 %  ???  ???    06/26/18 1300  22  100 %  Venturi mask  ???    06/26/18 1245  22  100 %  Ventilator  ???    06/26/18 1229  22  100 %  Ventilator  ???    06/26/18 1215  22  100 %  Ventilator  ???    06/26/18 1200  22  100 %  Ventilator  ???    06/26/18 1145  22  100 %  Ventilator  ???    06/26/18 1130  22  100 %  Ventilator  ???    06/26/18 1115  22  100 % Ventilator  ???    06/26/18 1100  22  99 %  Ventilator  ???    06/26/18 1045  22  100 %  Ventilator  ???    06/26/18 1030  22  99 %  Ventilator  ???    06/26/18 1017  22  ???  Ventilator  ???    06/26/18 1000  22  99 %  ???  ???    06/26/18 0932  22  98 %  ???  ???    06/26/18 0900  (!) 87  98 %  Ventilator  ???    06/26/18 0730  13  100 %  ???  ???    06/26/18 0700  (!) 0  99 %  ???  ???        Oxygen Connected to Wall:  yes    Pre-Hemodialysis Assessment     Date/Time Therapy Number Dialyzer All Machine Alarms Passed Air Detector Dialysis Flow (mL/min)    06/26/18 1245  ???  ???  ???  ???  ???    06/26/18 1018  4  F-180 (98 mLs)  Yes  Engaged  800 mL/min  Date/Time Verify Priming Solution Priming Volume Hemodialysis Independent pH Hemodialysis Machine Conductivity (mS/cm) Hemodialysis Independent Conductivity (mS/cm)    06/26/18 1245  ???  ???  7.3  13.8 mS/cm  13.9 mS/cm    06/26/18 1018  0.9% NS  300 mL  7.1  13.9 mS/cm  14.1 mS/cm    Date/Time Bicarb Conductivity Residual Bleach Negative Free Chlorine Total Chlorine Chloramine    06/26/18 1245 --  ??? --  ??? --    06/26/18 1018 --  Yes --  0 --        Pre-Hemodialysis Treatment Comments     Date/Time Pre-Hemodialysis Comments    06/26/18 1018  pt alert, s/p trach placement         Hemodialysis Treatment     Date/Time Blood Flow Rate (mL/min) Arterial Pressure (mmHg) Venous Pressure (mmHg) Transmembrane Pressure (mmHg)    06/26/18 1411  400 mL/min  -140 mmHg  150 mmHg  30 mmHg    06/26/18 1400  400 mL/min  -140 mmHg  150 mmHg  30 mmHg    06/26/18 1345  400 mL/min  -140 mmHg  140 mmHg  30 mmHg    06/26/18 1330  400 mL/min  -140 mmHg  140 mmHg  30 mmHg    06/26/18 1315  400 mL/min  -140 mmHg  140 mmHg  30 mmHg    06/26/18 1300  400 mL/min  -140 mmHg  140 mmHg  30 mmHg    06/26/18 1245  400 mL/min  -140 mmHg  140 mmHg  30 mmHg    06/26/18 1229  400 mL/min  -140 mmHg  140 mmHg  30 mmHg    06/26/18 1215  400 mL/min  -140 mmHg  140 mmHg  30 mmHg    06/26/18 1200  400 mL/min  -140 mmHg  140 mmHg  30 mmHg 06/26/18 1145  400 mL/min  -140 mmHg  140 mmHg  30 mmHg    06/26/18 1130  400 mL/min  -130 mmHg  140 mmHg  30 mmHg    06/26/18 1115  400 mL/min  -130 mmHg  140 mmHg  30 mmHg    06/26/18 1100  400 mL/min  -130 mmHg  130 mmHg  30 mmHg    06/26/18 1045  400 mL/min  -120 mmHg  130 mmHg  30 mmHg    06/26/18 1030  400 mL/min  -220 mmHg  90 mmHg  30 mmHg    Date/Time Ultrafiltration Rate (mL/hr) Ultrafiltrate Removed (mL) Dialysate Flow Rate (mL/min) KECN (Kecn)    06/26/18 1411  750 mL/hr  2580 mL  800 ml/min  ???    06/26/18 1400  750 mL/hr  2479 mL  800 ml/min  ???    06/26/18 1345  750 mL/hr  2329 mL  800 ml/min  ???    06/26/18 1330  750 mL/hr  2160 mL  800 ml/min  ???    06/26/18 1315  750 mL/hr  1989 mL  800 ml/min  ???    06/26/18 1300  750 mL/hr  1802 mL  800 ml/min  ???    06/26/18 1245  750 mL/hr  1555 mL  800 ml/min  ???    06/26/18 1229  740 mL/hr  1365 mL  800 ml/min  ???    06/26/18 1215  740 mL/hr  1190 mL  800 ml/min  ???    06/26/18 1200  740 mL/hr  1013 mL  800 ml/min  ???    06/26/18 1145  740 mL/hr  901 mL  800 ml/min  ???    06/26/18 1130  740 mL/hr  662 mL  800 ml/min  ???    06/26/18 1115  740 mL/hr  512 mL  800 ml/min  ???    06/26/18 1100  740 mL/hr  381 mL  800 ml/min  ???    06/26/18 1045  740 mL/hr  154 mL  800 ml/min  ???    06/26/18 1030  740 mL/hr  2 mL  800 ml/min  ???        Hemodialysis Treatment Comments     Date/Time Intra-Hemodialysis Comments    06/26/18 1415  post vss     06/26/18 1411  d/c dialysis     06/26/18 1400  stable     06/26/18 1345  stable    06/26/18 1330  stable    06/26/18 1315  stable     06/26/18 1300  stable     06/26/18 1245  stable,     06/26/18 1229  stable, eyes closed     06/26/18 1215  stable, eyes closed    06/26/18 1200  stable,     06/26/18 1145  stable, sleeping     06/26/18 1130  stable, sleeping     06/26/18 1115  stable, resting     06/26/18 1100  stable resting     06/26/18 1045  stable, lines reversed , Dr True in     06/26/18 1030  dialysis started         Post Treatment Date/Time Rinseback Volume (mL) On Line Clearance: spKt/V Total Liters Processed (L/min) Dialyzer Clearance    06/26/18 1423  300 mL  ???  77 L/min  Moderately streaked        Post Hemodialysis Treatment Comments     Date/Time Post-Hemodialysis Comments    06/26/18 1423  stable         POST TREATMENT ASSESSMENT:  General appearance:  alert  Neurological:  Grossly normal  Lungs:  clear to auscultation bilaterally  Hearts:  regular rate and rhythm, S1, S2 normal, no murmur, click, rub or gallop  Abdomen:  soft, non-tender; bowel sounds normal; no masses,  no organomegaly  Pulses:  2+ and symmetric.  Skin:  Skin color, texture, turgor normal. No rashes or lesions    Hemodialysis I/O     Date/Time Total Hemodialysis Replacement Volume (mL) Total Ultrafiltrate Output (mL)    06/26/18 1423  ???  2000 mL        [id number] - Medicaitons Given During Treatment  (last 8 hrs)         ** NO USER **       Medication Name Action Time Action Route Rate Dose User     MORPhine 4 mg/mL injection 2 mg 06/26/18 0854 DUE/Needs MedRec Intravenous   ** No User **          Domingo Madeira, RN       Medication Name Action Time Action Route Rate Dose User     insulin lispro (HumaLOG) injection 0-12 Units 06/26/18 1610 Not Given Subcutaneous   Jeryl Columbia Maghirang, RN          Hewitt Blade, RN       Medication Name Action Time Action Route Rate Dose User     MORPhine 4 mg/mL injection 2 mg 06/26/18 0902 Given Intravenous  2 mg Hewitt Blade, RN     MORPhine 4 mg/mL injection 2 mg 06/26/18 1203 Given Intravenous  2 mg Hewitt Blade, RN     calcitonin (salmon) (FORTICAL/MIACALCIN) nasal spray 1 spray 06/26/18 0900 Given Alternating Nares  1 spray Hewitt Blade, RN     chlorhexidine (PERIDEX) 0.12 % solution 5 mL 06/26/18 0951 Given Mouth  5 mL Hewitt Blade, RN     cycloSPORINE (NEORAL) oral microemulsion solution 06/26/18 0900 Given NG tube  75 mg Hewitt Blade, RN     famotidine (PEPCID) tablet 20 mg 06/26/18 0901 Given NG tube  20 mg Hewitt Blade, RN     insulin lispro (HumaLOG) injection 0-12 Units 06/26/18 1221 Not Given Subcutaneous   Hewitt Blade, RN     lidocaine (LIDODERM) 5 % patch 1 patch 06/26/18 0902 Patch Applied Transdermal  1 patch Hewitt Blade, RN     ondansetron [facility]) injection 4 mg 06/26/18 8119 Given Intravenous  4 mg Hewitt Blade, RN     polyethylene glycol [facility]) packet 17 g 06/26/18 0902 Given Oral  17 g Hewitt Blade, RN     sevelamer (RENAGEL) oral solution 06/26/18 0900 Given NG tube  800 mg Hewitt Blade, RN     sevelamer (RENAGEL) oral solution 06/26/18 1203 Given NG tube  800 mg Hewitt Blade, RN          BRIAN P [place] Scrape, [facility], [place]       Medication Name Action Time Action Route Rate Dose User     MORPhine 4 mg/mL injection 2 mg 06/26/18 0859 MAR Unhold Intravenous   Charleen Kirks, RPH     acetaminophen (TYLENOL) tablet 650 mg 06/26/18 0859 MAR Unhold NG tube   Charleen Kirks, RPH     albuterol nebulizer solution 2.5 mg 06/26/18 0859 MAR Unhold Nebulization   Charleen Kirks, RPH     calcitonin (salmon) (FORTICAL/MIACALCIN) nasal spray 1 spray 06/26/18 0859 MAR Unhold Alternating Nares   Charleen Kirks, RPH     chlorhexidine (PERIDEX) 0.12 % solution 5 mL 06/26/18 0859 MAR Unhold Mouth   Charleen Kirks, RPH     cycloSPORINE (NEORAL) oral microemulsion solution 06/26/18 0859 MAR Unhold NG tube   Charleen Kirks, RPH     dextrose (D10W) 10% bolus 125 mL 06/26/18 0859 MAR Unhold Intravenous   Charleen Kirks, RPH     dextrose 5 % and sodium chloride 0.9 % infusion 06/26/18 0859 MAR Unhold Intravenous   Charleen Kirks, RPH     famotidine (PEPCID) tablet 20 mg 06/26/18 0859 MAR Unhold NG tube   Charleen Kirks, RPH     insulin lispro (HumaLOG) injection 0-12 Units 06/26/18 0859 MAR Unhold Subcutaneous   Charleen Kirks, RPH     lidocaine (LIDODERM) 5 % patch 1 patch 06/26/18 0859 MAR Unhold Transdermal   Charleen Kirks, RPH     melatonin tablet 3 mg 06/26/18 0859 MAR Unhold Oral   Charleen Kirks, RPH     ondansetron [facility]) injection 4 mg 06/26/18 0859 MAR Unhold Intravenous   Charleen Kirks, RPH     oxyCODONE (ROXICODONE) immediate release tablet 5 mg 06/26/18 0859 MAR Unhold NG tube   Charleen Kirks, RPH     polyethylene glycol (MIRALAX) packet 17 g 06/26/18 0859 MAR Unhold Oral   Charleen Kirks, RPH     senna (SENOKOT) tablet 1 tablet 06/26/18 0859 MAR Unhold Oral   Charleen Kirks, RPH     sevelamer (RENAGEL) oral solution 06/26/18 0859 MAR Unhold NG tube  Charleen Kirks, RPH     sodium chloride 3 % nebulizer solution 4 mL 06/26/18 0859 MAR Unhold Nebulization   Charleen Kirks, RPH          Unk Lightning, RN       Medication Name Action Time Action Route Rate Dose User     heparin (porcine) 1000 unit/mL injection 1,500 Units 06/26/18 1420 Given Intra-cannular  1.5 Units Unk Lightning, RN     heparin (porcine) 1000 unit/mL injection 1,700 Units 06/26/18 1420 Given Intra-cannular  1.7 Units Unk Lightning, RN          Obie Dredge, RRT       Medication Name Action Time Action Route Rate Dose User     sodium chloride 3 % nebulizer solution 4 mL 06/26/18 0847 Given Nebulization  4 mL Obie Dredge, RRT          USER EPIC       Medication Name Action Time Action Route Rate Dose User     MORPhine 4 mg/mL injection 2 mg 06/26/18 0819 MAR Hold Intravenous   User Epic     acetaminophen (TYLENOL) tablet 650 mg 06/26/18 0819 MAR Hold NG tube   User Epic     albuterol nebulizer solution 2.5 mg 06/26/18 0819 MAR Hold Nebulization   User Epic     calcitonin (salmon) (FORTICAL/MIACALCIN) nasal spray 1 spray 06/26/18 0819 MAR Hold Alternating Nares   User Epic     chlorhexidine (PERIDEX) 0.12 % solution 5 mL 06/26/18 0819 MAR Hold Mouth   User Epic     cycloSPORINE (NEORAL) oral microemulsion solution 06/26/18 0819 MAR Hold NG tube   User Epic     dextrose (D10W) 10% bolus 125 mL 06/26/18 0819 MAR Hold Intravenous   User Epic     dextrose 5 % and sodium chloride 0.9 % infusion 06/26/18 0819 MAR Hold Intravenous   User Epic     famotidine (PEPCID) tablet 20 mg 06/26/18 0819 MAR Hold NG tube   User Epic     insulin lispro (HumaLOG) injection 0-12 Units 06/26/18 0819 MAR Hold Subcutaneous   User Epic     lidocaine (LIDODERM) 5 % patch 1 patch 06/26/18 0819 MAR Hold Transdermal   User Epic     melatonin tablet 3 mg 06/26/18 0819 MAR Hold Oral   User Epic     ondansetron (ZOFRAN) injection 4 mg 06/26/18 0819 MAR Hold Intravenous   User Epic     oxyCODONE (ROXICODONE) immediate release tablet 5 mg 06/26/18 0819 MAR Hold NG tube   User Epic     polyethylene glycol (MIRALAX) packet 17 g 06/26/18 0819 MAR Hold Oral   User Epic     senna (SENOKOT) tablet 1 tablet 06/26/18 0819 MAR Hold Oral   User Epic     sevelamer (RENAGEL) oral solution 06/26/18 0819 MAR Hold NG tube   User Epic     sodium chloride 3 % nebulizer solution 4 mL 06/26/18 0819 MAR Hold Nebulization   User Epic

## 2018-06-26 NOTE — Unmapped (Signed)
HEMODIALYSIS INTRA-PROCEDURE NOTE    Patient Katherine Norton was seen and examined on hemodialysis June 26, 2018.    CHIEF COMPLAINT:  Acute Kidney Disease    INTERVAL HISTORY:   Remains intubated.    PHYSICAL EXAM:  Vitals:  Temp:  [36.6 ??C (97.9 ??F)-37.3 ??C (99.2 ??F)] 36.9 ??C (98.4 ??F)  Heart Rate:  [65-96] 81  SpO2 Pulse:  [65-94] 74  MAP:  [87 mmHg-133 mmHg] 96 mmHg  A BP-2: (126-182)/(63-100) 145/71  MAP:  [87 mmHg-133 mmHg] 96 mmHg    Weights:  Admission Weight: 92.5 kg (203 lb 14.8 oz)  Last documented Weight: 84.2 kg (185 lb 10 oz)  Weight Change from Previous Day: No weight listed for specified days    Assessment:  General: Appearing intubated  Pulmonary: rhonchi  Cardiovascular: regular rate and rhythm  Extremities:   1+ edema to calf     ACCESS:       LAB DATA:  Lab Results   Component Value Date    NA 133 (L) 06/26/2018    K 4.4 06/26/2018    CL 102 06/26/2018    CO2 24.0 06/26/2018    BUN 29 (H) 06/26/2018    CREATININE 3.51 (H) 06/26/2018    CALCIUM 8.5 06/26/2018    PHOS 3.5 06/26/2018    ALBUMIN 2.6 (L) 06/26/2018     Lab Results   Component Value Date    HGB 7.8 (L) 06/26/2018    HCT 23.7 (L) 06/26/2018    PLT 180 06/26/2018       PLAN:  Ultrafiltration Goal:  2 L

## 2018-06-26 NOTE — Unmapped (Signed)
Patient is currently on the ventilator and settings have not changed.

## 2018-06-26 NOTE — Unmapped (Signed)
Operative Note  (CSN: [id number])      Date of Surgery: 06/26/2018    Pre-op Diagnosis: respiratory failure    Post-op Diagnosis: same    Procedure(s):    Note: Revisions to procedures should be made in chart - see Procedures tab.    Performing Service: ENT  Surgeon(s) and Role:     * Theodosia Blender, MD - Primary    Anesthesia: General    Estimated Blood Loss: 5 mL    Complications: None    Specimens: None collected    Implants: * No implants in log *    Operative Findings: previous trach scar. 6-0 shiley placed.     Procedure Description:   The patient was appropriately identified in the preoperative holding area.  Risks, benefits and alternatives were reviewed with the patient, who agreed to proceed and signed consent.    The patient was subsequently transferred back to the operating room by Anesthesia staff and placed in supine position on the operating table.  Following, first pre-procedural timeout, including patient and procedure verification, general anesthesia was induced without complication.  The patient was already intubated , circuit connected and adequate ventilation was confirmed.     Patient was then turned over to the surgical team.  The laryngeal landmarks and sternal notch were marked.  A midline 4 cm skin incision was planned 1 fingerbreadth above the sternal notch.  Patient was prepped and draped in standard fashion.  Second pre-procedural timeout was conducted.    Incision was made with a bovie through skin and subcutaneous tissue and through the platysma.  The midline raphe was identified and the strap muscles lateralized from cricoid to the sternal notch to expose the thyroid isthmus and airway.  The thyroid isthmus was divided meticulously with electrocautery, the lobes lateralized and the trachea exposed.      Tracheotomy was then made with a 15 blade scalpel, and widened with a curve mayo scissor and Trousseau dilator.  A #6 cuffed tracheostomy tube was inserted into the airway, circuit connected and adequate ventilation was confirmed.  The tube was secured to the neck with 2-0 silk sutures and a tracheostomy tie applied.    Patient was turned back over to anesthesia for transport    Surgeon Notes: I was present and scrubbed for the entire procedure    Alisa Graff   Date: 06/26/2018  Time: 8:25 AM

## 2018-06-26 NOTE — Unmapped (Signed)
VSS, pt appears oriented, can write down needs, follows commands. Family visited and updated on plan of care. Plan for trach placement tomorrow AM then HD. PRN oxycodone given x1 for complaint of back pain. No acute events today. Will continue to monitor closely.

## 2018-06-26 NOTE — Unmapped (Signed)
Brief Operative Note  (CSN: [id number])      Date of Surgery: 06/26/2018    Pre-op Diagnosis: respiratory failure    Post-op Diagnosis: same    Procedure(s):  TRACHEOSTOMY PLANNED (SEPART PROC): 31600 (CPT??)  Note: Revisions to procedures should be made in chart - see Procedures activity.    Performing Service: ENT  Surgeon(s) and Role:     * Theodosia Blender, MD - Primary    Assistant: None    Findings: previous trach scar. 6-0 shiley placed    Anesthesia: General    Estimated Blood Loss: 5 mL    Complications: None    Specimens: None collected    Implants: * No implants in log *    Surgeon Notes: I was present and scrubbed for the entire procedure    Alisa Graff   Date: 06/26/2018  Time: 8:24 AM

## 2018-06-26 NOTE — Unmapped (Signed)
Pt pain controlled. Pt on ventilator. Pt NSR on monitor, blood pressure normal. Pt NPO since midnight pending procedure this morning.   Problem: Adult Inpatient Plan of Care  Goal: Plan of Care Review  Outcome: Progressing  Goal: Patient-Specific Goal (Individualization)  Outcome: Progressing  Goal: Absence of Hospital-Acquired Illness or Injury  Outcome: Progressing  Goal: Optimal Comfort and Wellbeing  Outcome: Progressing  Goal: Readiness for Transition of Care  Outcome: Progressing  Goal: Rounds/Family Conference  Outcome: Progressing     Problem: Latex Allergy  Goal: Absence of Allergy Symptoms  Outcome: Progressing     Problem: Skin Injury Risk Increased  Goal: Skin Health and Integrity  Outcome: Progressing     Problem: Fall Injury Risk  Goal: Absence of Fall and Fall-Related Injury  Outcome: Progressing     Problem: Self-Care Deficit  Goal: Improved Ability to Complete Activities of Daily Living  Outcome: Progressing     Problem: Hypertension Comorbidity  Goal: Blood Pressure in Desired Range  Outcome: Progressing     Problem: Pain Chronic (Persistent) (Comorbidity Management)  Goal: Acceptable Pain Control and Functional Ability  Outcome: Progressing     Problem: Fluid Imbalance (Pneumonia)  Goal: Fluid Balance  Outcome: Progressing     Problem: Infection (Pneumonia)  Goal: Resolution of Infection Signs/Symptoms  Outcome: Progressing     Problem: Respiratory Compromise (Pneumonia)  Goal: Effective Oxygenation and Ventilation  Outcome: Progressing     Problem: Device-Related Complication Risk (Hemodialysis)  Goal: Safe, Effective Therapy Delivery  Outcome: Progressing     Problem: Hemodynamic Instability (Hemodialysis)  Goal: Vital Signs Remain in Desired Range  Outcome: Progressing     Problem: Infection (Hemodialysis)  Goal: Absence of Infection Signs/Symptoms  Outcome: Progressing     Problem: Communication Impairment (Mechanical Ventilation, Invasive)  Goal: Effective Communication  Outcome: Progressing Problem: Noninvasive Ventilation Acute  Goal: Effective Unassisted Ventilation and Oxygenation  Outcome: Progressing

## 2018-06-26 NOTE — Unmapped (Signed)
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 female with  PMHx of liver transplant (1999) on immunosuppresion, chronic back pain, anxiety, HTN, CKD,  that presents to [facility] as a transfer from [facility] on 8/10, with respiratory failure and possible contained duodenal perforation. She was transferred to MICU the following day for vent mngmt since there wasn't any evid of perf per gen surg.     24hr events: no events over night    Dispo: ICU    Neurological   #acute encephalopathy: resolved    #acute on chronic pain, anxiety  -cont lidoderm patch to back and tylenol prn to minimize narc  -  oxy to 5 q 6 PRN + PRN morphine for breakthrough  - melatonin for sleep    Pulmonary   #acute hypoxic respiratory failure likely due to multifocal PNA, ARDS.  Intubated on 8/1 at OSH CT chest with Scattered airspace opacities in the left apex, left upper lobe and superior segment of left lower lobe; concerning for pneumonia.  Extubated 8/12 and reint 8/13 for progressive hypoxia and inc WOB .  Extubated 8/25.  Reintubated on 8/28 am after R sided plug/collapse.  S/p trach 9/3    - tolerating PRVC well >> TCT after HD today   - peridex for vap ppx  - aggressive pulm toilet as tol  - hypertonic nebs      Cardiovascular   #acute HFrEF  #h/o hypertension   - hold home metoprolol and amlodipine   - prn hydralazine  - Echo 7/29: EF 65-70%, mod pulm HTN and borderline dilated RV   - repeat echo 8/16 EF >55%, severe RV fx and dilated RA, severe phtn, mod TR    Renal   #Acute on chronic renal failure  - Cr baseline appears to be ~1.7--> 3-->2.87  - nephrology consulted 8/16, appreciate recs >> CRRT started 8/22, clotted off 8/25. First iHD on 8/27 with 2.5 L removed.  Tolerated well.      - foley removed 8/27, consider bladder scan daily  - last iHD tolerated well for 2L UF, next after OR 9/2.  Needs gent lock after HD     Infectious Disease/Autoimmune   #septic shock due to ? PNA.  Initially seen at OSH and had c/f perforated duodenal ulcer, but CT scan here non concerning.  CT chest with possible multifocal PNA.    - appreciate ID's assistance. ID signed off 8/17  - WOCN consulted for intertrigo and sacral wound (see recs 8/19)  - last Blood/urine/sputum cxs sent 8/21, abx given x 1 dose    Abx:   Cefepime (8/22- 8/22)  Fluconazole (8/22- 8/22)  Vanco (8/11-->8/12)  Mica (8/11-->8/12)  Flagyl (8/10-->8/14)  Cefepime  (8/10-->8/16)  Voriconazole (8/12-->8/15)  Linezolid (8/15-8/16)    Abx: OSH  ceftaroline and flagyl  levaquin  meropenem     Cx OSH:  - 7/29 Strep pneumo Ag neg,  RVP neg, urine Legionella Ag neg  - 7/29 bd cx NGTD  OSH: Fungal, pneumocystis/histoplasma, AFB, Gram on BAL all were negative.    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      Malnutrition Assessment: Not done yet.  Patient does not meet AND/ASPEN criteria for malnutrition at this time (06/13/18 1522)  - previously had FMS for liquid stools, output reduced and removed  - resume TF after trach      Heme/Coag   #Critical illness anemia  - last transfusion 2 units PRBC 8/20-8/21 and 1unit 8/26, no s/s of bleeding  - SCDs and heparin for DVT ppx    Endocrine   #Hypoglycemia  - cont accu check while TF on hold  - glucose within target range yes      Prophylaxis/LDA/Restraints/Consults   Can CVC be removed? No: dialysis catheter   Can A-line be removed? No: inadequate non-invasive pressure monitoring  Can Foley be removed? N/A, no Foley present  Mobility plan: Step 3 - Bed in chair position, PT consulted (for tomorrow)    Feeding: Tube feeds at goal  Analgesia: Pain adequately controlled  Sedation SAT/SBT: Yes  Thrombembolic ppx: SQ heparin  Head of bed >30 degrees: Yes  Ulcer ppx: Yes, home use continued  Glucose within target range: Yes, in range    RASS at goal? N/A, not on sedation  [place] Agitation Assessment Scale (RASS) : 0 (06/26/2018 12:00 PM)     Can antipsychotics be stopped? N/A, not on antipsychotics  CAM-ICU Result: Positive (06/26/2018  9:00 AM)      Patient Lines/Drains/Airways Status    Active Active Lines, Drains, & Airways     Name:   Placement date:   Placement time:   Site:   Days:    ETT  7.5   06/20/18    0914     6    CVC Triple Lumen 06/03/18 Non-tunneled Right Internal jugular   06/03/18    1503    Internal jugular   22    NG/OG Tube Right nostril   06/02/18    2121    Right nostril   23    Arterial Line 06/03/18 Right Radial   06/03/18    0007    Radial   23    Hemodialysis Catheter 06/14/18 Left Internal jugular   06/14/18    1400    Internal jugular   11              Patient Lines/Drains/Airways Status    Active Wounds     Name:   Placement date:   Placement time:   Site:   Days:    Wound 06/08/18 Pressure Injury Buttocks Posterior pink Stage 2   06/08/18    0001    Buttocks   18    Wound 06/09/18 Rash/Dermititis Perineum   06/09/18    2154    Perineum   16    Wound 06/18/18 Abdomen Lower   06/18/18    1102    Abdomen  8    Wound 06/18/18 Rash/Dermititis Breast Left;Lower   06/18/18    1108    Breast   8                Goals of Care     Code Status: Full Code    Designated Healthcare Decision Maker:  [name] currently lacks decisional capacity for healthcare decision-making and is unable to designate a surrogate healthcare decision maker. Ms. Stockburger designated healthcare decision maker(s) is/are sueko dimichele and [name] (the patient's adult child) as denoted by hospital policy for patients without a known preference.      Subjective   Katherine Norton is a 62 y.o. female with PMHx of liver transplant (1999) on immunosuppresion, chronic back pain, anxiety, HTN, CKD,  that presents to [facility] as a transfer from [facility]. Patient initially presented to the OSH on 05/20/18 due to acute worsening shortness of breath and altered mental status. Patient was sating 68% on RA, but 90% on CPAP. Patient had severe acidemia on ABG as well as acute on chronic renal failure with Cr of 3.89. CXR at that time showed consolidation with concern for pneumonia and was febrile. Patient's initial lactate was 2.70. Patient was started on IV ceftaroline and flagyl initially.      Objective     Vitals - past 24 hours  Temp:  [36.6 ??C (97.9 ??F)-37.3 ??C (99.2 ??F)] 36.9 ??C (98.4 ??F)  Heart Rate:  [65-94] 82  SpO2 Pulse:  [65-94] 74  Resp:  [0-87] 22  FiO2 (%):  [30 %] 30 %  SpO2:  [98 %-100 %] 100 % Intake/Output  I/O last 3 completed shifts:  In: 1700 [NG/GT:1700]  Out: -      Physical Exam:    General: obese female, A&O person, place, year, nods y/n, mouths words   HEENT: PERRLA  CV: RRR, no mrg  Pulm: coarse bs bilaterally  GI: soft, distended, intmt tender on palapation  MSK: no edema  Skin: intact x intertrigo under bilateral breasts, panniculus, and perineal area  Neuro: awake, FSC, gen weakness, all ext 4-/5      Continuous Infusions:   ??? dextrose 5 % and sodium chloride 0.9 % Stopped (06/23/18 0800)       Scheduled Medications:   ??? calcitonin (salmon)  1 spray Alternating Nares Daily (RT)   ??? chlorhexidine  5 mL Mouth BID   ??? cycloSPORINE  75 mg NG tube BID   ??? famotidine  20 mg NG tube Daily   ??? insulin lispro  0-12 Units Subcutaneous Q6H [facility]   ??? lidocaine  1 patch Transdermal Daily   ??? melatonin  3 mg Oral Nightly   ??? polyethylene glycol  17 g Oral BID   ??? senna  1 tablet Oral Nightly   ??? sevelamer  800 mg NG tube 3xd Meals   ??? sodium chloride  4 mL Nebulization TID (RT)       PRN medications: acetaminophen, albuterol, dextrose, dextrose 5 % and sodium chloride 0.9 %, heparin (porcine), heparin (porcine), MORPhine injection, ondansetron, oxyCODONE      Data/Imaging Review: Reviewed in Epic and personally interpreted on 06/26/2018. See EMR for detailed results.      Critical Care Attestation     This patient is critically ill or injured with the impairment of vital organ systems such that there is a high probability of imminent or life threatening deterioration in the patient's condition. This patient must remain in the ICU  for ongoing evaluation of the comprehensive management plan outlined in this note. I directly provided critical care services as documented in this note and the critical care time spent (30 min) is exclusive of separately billable procedures.    Lido Maske Fonnie Mu, ACNP

## 2018-06-26 NOTE — Unmapped (Signed)
Pt remains on vent. No changes made this shift. ETT secure at the lip. BSS coarse. Suctioned for small thick tan secretions. VSS NAD

## 2018-06-27 DIAGNOSIS — J9601 Acute respiratory failure with hypoxia: Principal | ICD-10-CM

## 2018-06-27 LAB — CBC
HEMATOCRIT: 21.6 % — ABNORMAL LOW (ref 36.0–46.0)
HEMOGLOBIN: 7 g/dL — ABNORMAL LOW (ref 12.0–16.0)
MEAN CORPUSCULAR HEMOGLOBIN: 30.4 pg (ref 26.0–34.0)
MEAN CORPUSCULAR VOLUME: 94.2 fL (ref 80.0–100.0)
MEAN PLATELET VOLUME: 8.3 fL (ref 7.0–10.0)
RED BLOOD CELL COUNT: 2.3 10*12/L — ABNORMAL LOW (ref 4.00–5.20)
RED CELL DISTRIBUTION WIDTH: 15.7 % — ABNORMAL HIGH (ref 12.0–15.0)
WBC ADJUSTED: 2.4 10*9/L — ABNORMAL LOW (ref 4.5–11.0)

## 2018-06-27 LAB — COMPREHENSIVE METABOLIC PANEL
ALBUMIN: 2.4 g/dL — ABNORMAL LOW (ref 3.5–5.0)
ALKALINE PHOSPHATASE: 73 U/L (ref 38–126)
ALT (SGPT): 14 U/L — ABNORMAL LOW (ref 15–48)
ANION GAP: 7 mmol/L — ABNORMAL LOW (ref 9–15)
AST (SGOT): 20 U/L (ref 14–38)
BLOOD UREA NITROGEN: 12 mg/dL (ref 7–21)
BUN / CREAT RATIO: 6
CALCIUM: 7.7 mg/dL — ABNORMAL LOW (ref 8.5–10.2)
CHLORIDE: 107 mmol/L (ref 98–107)
CO2: 23 mmol/L (ref 22.0–30.0)
CREATININE: 2.07 mg/dL — ABNORMAL HIGH (ref 0.60–1.00)
EGFR CKD-EPI AA FEMALE: 29 mL/min/{1.73_m2} — ABNORMAL LOW (ref >=60)
EGFR CKD-EPI NON-AA FEMALE: 25 mL/min/{1.73_m2} — ABNORMAL LOW (ref >=60)
GLUCOSE RANDOM: 76 mg/dL (ref 65–179)
POTASSIUM: 3.8 mmol/L (ref 3.5–5.0)
PROTEIN TOTAL: 6.5 g/dL (ref 6.5–8.3)
SODIUM: 137 mmol/L (ref 135–145)

## 2018-06-27 LAB — HEPARIN CORRELATION: Lab: 0.2

## 2018-06-27 LAB — PROTIME-INR: PROTIME: 13.6 s — ABNORMAL HIGH (ref 10.2–12.8)

## 2018-06-27 LAB — MAGNESIUM: Magnesium:MCnc:Pt:Ser/Plas:Qn:: 1.8

## 2018-06-27 LAB — MEAN CORPUSCULAR HEMOGLOBIN: Lab: 30.4

## 2018-06-27 LAB — APTT: APTT: 30.3 s (ref 25.9–39.5)

## 2018-06-27 LAB — CO2: Carbon dioxide:SCnc:Pt:Ser/Plas:Qn:: 23

## 2018-06-27 LAB — PHOSPHORUS: Phosphate:MCnc:Pt:Ser/Plas:Qn:: 2.2 — ABNORMAL LOW

## 2018-06-27 LAB — PROTIME: Lab: 13.6 — ABNORMAL HIGH

## 2018-06-27 NOTE — Unmapped (Signed)
Pt following commands, moves all extremities, acute and chronic pain well controlled with PRN medication. Pt on ventilator, lung fields clear, minimal secretions. Pt bleeding around new trach site. VSS overnight. Pt oliguric, voided once overnight. BM x1 overnight. Pt not tolerating TF at goal. Pt c/o abdominal cramping and nausea. Tube feeds infusing at 45ml/hr.    Problem: Adult Inpatient Plan of Care  Goal: Plan of Care Review  Outcome: Progressing  Goal: Patient-Specific Goal (Individualization)  Outcome: Progressing  Goal: Absence of Hospital-Acquired Illness or Injury  Outcome: Progressing  Goal: Optimal Comfort and Wellbeing  Outcome: Progressing  Goal: Readiness for Transition of Care  Outcome: Progressing  Goal: Rounds/Family Conference  Outcome: Progressing     Problem: Latex Allergy  Goal: Absence of Allergy Symptoms  Outcome: Progressing     Problem: Skin Injury Risk Increased  Goal: Skin Health and Integrity  Outcome: Progressing     Problem: Fall Injury Risk  Goal: Absence of Fall and Fall-Related Injury  Outcome: Progressing     Problem: Self-Care Deficit  Goal: Improved Ability to Complete Activities of Daily Living  Outcome: Progressing     Problem: Hypertension Comorbidity  Goal: Blood Pressure in Desired Range  Outcome: Progressing     Problem: Pain Chronic (Persistent) (Comorbidity Management)  Goal: Acceptable Pain Control and Functional Ability  Outcome: Progressing     Problem: Fluid Imbalance (Pneumonia)  Goal: Fluid Balance  Outcome: Progressing     Problem: Infection (Pneumonia)  Goal: Resolution of Infection Signs/Symptoms  Outcome: Progressing     Problem: Respiratory Compromise (Pneumonia)  Goal: Effective Oxygenation and Ventilation  Outcome: Progressing     Problem: Device-Related Complication Risk (Hemodialysis)  Goal: Safe, Effective Therapy Delivery  Outcome: Progressing     Problem: Hemodynamic Instability (Hemodialysis)  Goal: Vital Signs Remain in Desired Range  Outcome: Progressing     Problem: Infection (Hemodialysis)  Goal: Absence of Infection Signs/Symptoms  Outcome: Progressing     Problem: Communication Impairment (Mechanical Ventilation, Invasive)  Goal: Effective Communication  Outcome: Progressing     Problem: Noninvasive Ventilation Acute  Goal: Effective Unassisted Ventilation and Oxygenation  Outcome: Progressing

## 2018-06-27 NOTE — Unmapped (Signed)
Pt was trached today in the OR. She received HD. She tolerated trach collar trials for approx. 3 hours and is now on the vent to rest over night. No other acute events this shift. VSS. See flowsheets for trends. Will continue to monitor.

## 2018-06-27 NOTE — Unmapped (Signed)
Patient stable with no significant events.   Plan to continue to do trach collar during the day per respiratory.   Hope to get IV access preferably a midline and remove right radial a-line and right IJ triple lumen.     Neurologically improving. PT/OT/Speech consult ordered. Patient helps turn and communicates through writing. Minimally anxious.     Cardiac right a-line in place. Plan to remove. Systolic BP range from 140-160 systolic. HR NSR with no ectopy. Edema improved +1 post dialysis.     Respiratory recently trached with bloody secretions.Verbalizes pain at trach site. Plan for trach collar today. PRVC at night for rest per rounds 22/300/5/30%    Strong cough. Lungs clear but diminished in bases.     GI TF NOT at goal due to nausea and abdominal cramping with advancement. NGT marked with green marker. TF at 20 ml an hour with 30 q 4 water flush. BS active x 4 + gas. LBM 9.3.19, incontinent and loose.     GU minimal urine output secondary to RF. Dialysis Tuesday/Thursday/Saturday through left IJ vascath. Tolerated dialysis Tuesday 9.3.19    Skin SEE flow sheet. Turning q 2 hours.     Access RIJ triple with hopes of midline per rounds.  Right radial a-line   Left IJ vascath.

## 2018-06-27 NOTE — Unmapped (Signed)
PICC LINE REFERRAL NOTE    The Venous Access Team has assessed this patient for the placement of a PICC line.  This patient was found to be an inappropriate bedside PICC line  candidate due to renal disease that may require future fistula or graft.    This patient has been referred for a VIR consult.  Recommend tunneled line at this time.         Francesco Runner of the MDI service has been notified.    Thank You,    Jacqulyn Liner RN Venous Access Team [phone]    Workup Time:  30 minutes

## 2018-06-27 NOTE — Unmapped (Signed)
PT RECEIVED [facility] YESTERDAY  Patient is currently on the ventilator and settings have not changed.

## 2018-06-27 NOTE — Unmapped (Signed)
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 female with  PMHx of liver transplant (1999) on immunosuppresion, chronic back pain, anxiety, HTN, CKD,  that presents to [facility] as a transfer from [facility] on 8/10, with respiratory failure and possible contained duodenal perforation. She was transferred to MICU the following day for vent mngmt since there wasn't any evid of perf per gen surg.     24hr events: no events over night, rested on vent     Dispo: ICU, pt amenable to back transfer to [facility] in hopes of acute rehab, process initiated, contact transfer center [phone] to speak w Dr Babs Bertin (MICU attending)    Neurological   #acute encephalopathy: resolved    #acute on chronic pain, anxiety  -cont lidoderm patch to back and tylenol prn to minimize narc  -  oxy to 5 q 6 PRN + PRN morphine for breakthrough  - melatonin for sleep    Pulmonary   #acute hypoxic respiratory failure likely due to multifocal PNA, ARDS.  Intubated on 8/1 at OSH CT chest with Scattered airspace opacities in the left apex, left upper lobe and superior segment of left lower lobe; concerning for pneumonia.  Extubated 8/12 and reint 8/13 for progressive hypoxia and inc WOB .  Extubated 8/25.  Reintubated on 8/28 am after R sided plug/collapse.  S/p trach 9/3    - tolerating PRVC well >> TCT as tol  - peridex for vap ppx  - aggressive pulm toilet as tol  - hypertonic nebs  - speech therapy consulted for PMV trials >> will reassess 48hrs post trach     Cardiovascular   #acute HFrEF  #h/o hypertension  # PEA arrest peri-intubation #1 (CPR and epi, ROSC within 1 min)   - hold home metoprolol and amlodipine   - prn hydralazine  - Echo 7/29: EF 65-70%, mod pulm HTN and borderline dilated RV   - repeat echo 8/16 EF >55%, severe RV fx and dilated RA, severe phtn, mod TR    Renal   #Acute on chronic renal failure  - Cr baseline appears to be ~1.7--> 3-->2.87  - nephrology consulted 8/16, appreciate recs >> CRRT started 8/22, clotted off 8/25. First iHD on 8/27 with 2.5 L removed.  Tolerated well.      - foley removed 8/27, consider bladder scan daily  - last iHD tolerated well for 2L UF, next 9/5  - cont to have intmt incontinent voids, consider purewick for accurate I/O     Infectious Disease/Autoimmune   #septic shock due to ? PNA.  Initially seen at OSH and had c/f perforated duodenal ulcer, but CT scan here non concerning.  CT chest with possible multifocal PNA.    - appreciate ID's assistance. ID signed off 8/17  - WOCN consulted for intertrigo and sacral wound (see recs 8/19)  - last Blood/urine/sputum cxs sent 8/21, abx given x  1 dose    Abx:   Cefepime (8/22- 8/22)  Fluconazole (8/22- 8/22)  Vanco (8/11-->8/12)  Mica (8/11-->8/12)  Flagyl (8/10-->8/14)  Cefepime  (8/10-->8/16)  Voriconazole (8/12-->8/15)  Linezolid (8/15-8/16)    Abx: OSH  ceftaroline and flagyl  levaquin  meropenem     Cx OSH:  - 7/29 Strep pneumo Ag neg, RVP neg, urine Legionella Ag neg  - 7/29 bd cx NGTD  OSH: Fungal, pneumocystis/histoplasma, AFB, Gram on BAL all were negative.    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 Malnutrition Assessment: Not done yet.  Patient does not meet AND/ASPEN criteria for malnutrition at this time (06/13/18 1522)  - previously had FMS for liquid stools, output reduced and removed  - tolerating TF well at goal      Heme/Coag   #Critical illness anemia  - last transfusion 2 units PRBC 8/20-8/21 and 1unit 8/26, no s/s of bleeding  - SCDs and heparin for DVT ppx    Endocrine   #Hypoglycemia  - cont accu check while TF on hold  - glucose within target range yes      Prophylaxis/LDA/Restraints/Consults   Can CVC be removed? No: dialysis catheter   Can A-line be removed? No: inadequate non-invasive pressure monitoring  Can Foley be removed? N/A, no Foley present  Mobility plan: Step 3 - Bed in chair position, PT consulted (for tomorrow)    Feeding: Tube feeds at goal  Analgesia: Pain adequately controlled  Sedation SAT/SBT: Yes  Thrombembolic ppx: SQ heparin  Head of bed >30 degrees: Yes  Ulcer ppx: Yes, home use continued  Glucose within target range: Yes, in range    RASS at goal? N/A, not on sedation  [place] Agitation Assessment Scale (RASS) : +1 (06/27/2018 10:00 AM)     Can antipsychotics be stopped? N/A, not on antipsychotics  CAM-ICU Result: Negative (06/27/2018  8:00 AM)      Patient Lines/Drains/Airways Status    Active Active Lines, Drains, & Airways     Name:   Placement date:   Placement time:   Site:   Days:    Tracheostomy Shiley 6 Cuffed   06/26/18    0900    6   1    CVC Triple Lumen 06/03/18 Non-tunneled Right Internal jugular   06/03/18    1503    Internal jugular   23    NG/OG Tube Right nostril   06/02/18    2121    Right nostril   24    Arterial Line 06/03/18 Right Radial   06/03/18    0007    Radial   24    Hemodialysis Catheter 06/14/18 Left Internal jugular   06/14/18    1400    Internal jugular   12              Patient Lines/Drains/Airways Status    Active Wounds     Name:   Placement date:   Placement time:   Site:   Days:    Wound 06/08/18 Pressure Injury Buttocks  Posterior pink Stage 2   06/08/18    0001    Buttocks   19    Wound 06/09/18 Rash/Dermititis Perineum   06/09/18    2154    Perineum   17    Wound 06/18/18 Abdomen Lower   06/18/18    1102    Abdomen   8    Wound 06/18/18 Rash/Dermititis Breast Left;Lower   06/18/18    1108    Breast   8                Goals of Care     Code Status: Full Code    Designated Healthcare Decision Maker:  Ms. Flavell currently lacks decisional capacity for healthcare decision-making and is unable to designate a surrogate healthcare decision maker. Ms. Kaczorowski designated healthcare decision maker(s) is/are sontee desena and [name] (the patient's adult child) as denoted by hospital policy for patients without a known preference.      Subjective   Kailen Lynnex Fulp is a 62 y.o. female with PMHx of liver transplant (1999) on immunosuppresion, chronic back pain, anxiety, HTN, CKD,  that presents to [facility] as a transfer from [facility]. Patient initially presented to the OSH on 05/20/18 due to acute worsening shortness of breath and altered mental status. Patient was sating 68% on RA, but 90% on CPAP. Patient had severe acidemia on ABG as well as acute on chronic renal failure with Cr of 3.89. CXR at that time showed consolidation with concern for pneumonia and was febrile. Patient's initial lactate was 2.70. Patient was started on IV ceftaroline and flagyl initially.      Objective     Vitals - past 24 hours  Temp:  [36.9 ??C (98.4 ??F)-37.7 ??C (99.9 ??F)] 37.4 ??C (99.3 ??F)  Heart Rate:  [67-96] 89  SpO2 Pulse:  [68-88] 80  Resp:  [4-24] 11  FiO2 (%):  [30 %-35 %] 30 %  SpO2:  [88 %-100 %] 88 % Intake/Output  I/O last 3 completed shifts:  In: 700 [NG/GT:700]  Out: 2305 [Urine:300; Other:2000; Blood:5]     Physical Exam:    General: obese female, A&O person, place, year, nods y/n, mouths words, writes Rt hand   HEENT: PERRLA,normocephalic  CV: RRR, no mrg, trace edema BLE  Pulm: clear diminished BS throughout, dec bases  GI: soft, obese, intmt tender on palpation (unchanged)  Skin: intact x intertrigo under bilateral breasts, panniculus, and perineal area  Neuro: awake, FSC, gen weakness, all ext 4-/5      Continuous Infusions:   ??? dextrose 5 % and sodium chloride 0.9 % Stopped (06/23/18 0800)       Scheduled Medications:   ??? calcitonin (salmon)  1 spray Alternating Nares Daily (RT)   ??? chlorhexidine  5 mL Mouth BID   ??? cycloSPORINE  75 mg NG tube BID   ??? famotidine  20 mg NG tube Daily   ??? heparin (porcine) for subcutaneous use  5,000 Units Subcutaneous [facility]   ??? insulin lispro  0-12 Units Subcutaneous Q6H [facility]   ??? lidocaine  1 patch Transdermal Daily   ??? melatonin  3 mg Oral Nightly   ??? polyethylene glycol  17 g Oral BID   ??? senna  1 tablet Oral Nightly   ??? sodium chloride  4 mL Nebulization TID (RT)       PRN medications: acetaminophen, albuterol, dextrose, dextrose 5 % and sodium chloride 0.9 %, gentamicin 1 mg/mL, sodium citrate 4%, gentamicin  1 mg/mL, sodium citrate 4%, heparin (porcine), heparin (porcine), MORPhine injection, ondansetron, oxyCODONE      Data/Imaging Review: Reviewed in Epic and personally interpreted on 06/27/2018. See EMR for detailed results.      Critical Care Attestation     This patient is critically ill or injured with the impairment of vital organ systems such that there is a high probability of imminent or life threatening deterioration in the patient's condition. This patient must remain in the ICU for ongoing evaluation of the comprehensive management plan outlined in this note. I directly provided critical care services as documented in this note and the critical care time spent (30 min) is exclusive of separately billable procedures.    Esmond Hinch Fonnie Mu, ACNP

## 2018-06-28 DIAGNOSIS — J9601 Acute respiratory failure with hypoxia: Principal | ICD-10-CM

## 2018-06-28 LAB — CBC
HEMATOCRIT: 23 % — ABNORMAL LOW (ref 36.0–46.0)
HEMOGLOBIN: 7.6 g/dL — ABNORMAL LOW (ref 12.0–16.0)
MEAN CORPUSCULAR HEMOGLOBIN CONC: 33 g/dL (ref 31.0–37.0)
MEAN CORPUSCULAR HEMOGLOBIN: 30.8 pg (ref 26.0–34.0)
MEAN PLATELET VOLUME: 8.8 fL (ref 7.0–10.0)
PLATELET COUNT: 170 10*9/L (ref 150–440)
RED BLOOD CELL COUNT: 2.46 10*12/L — ABNORMAL LOW (ref 4.00–5.20)
RED CELL DISTRIBUTION WIDTH: 15.6 % — ABNORMAL HIGH (ref 12.0–15.0)
WBC ADJUSTED: 1.7 10*9/L — ABNORMAL LOW (ref 4.5–11.0)

## 2018-06-28 LAB — COMPREHENSIVE METABOLIC PANEL
ALBUMIN: 2.7 g/dL — ABNORMAL LOW (ref 3.5–5.0)
ALKALINE PHOSPHATASE: 93 U/L (ref 38–126)
ALT (SGPT): <8 U/L — ABNORMAL LOW (ref 15–48)
ANION GAP: 10 mmol/L (ref 9–15)
AST (SGOT): 24 U/L (ref 14–38)
BILIRUBIN TOTAL: 0.7 mg/dL (ref 0.0–1.2)
BLOOD UREA NITROGEN: 18 mg/dL (ref 7–21)
BUN / CREAT RATIO: 6
CALCIUM: 8.5 mg/dL (ref 8.5–10.2)
CHLORIDE: 103 mmol/L (ref 98–107)
CO2: 23 mmol/L (ref 22.0–30.0)
CREATININE: 3.18 mg/dL — ABNORMAL HIGH (ref 0.60–1.00)
EGFR CKD-EPI NON-AA FEMALE: 15 mL/min/{1.73_m2} — ABNORMAL LOW (ref >=60)
GLUCOSE RANDOM: 80 mg/dL (ref 65–179)
POTASSIUM: 4.2 mmol/L (ref 3.5–5.0)
PROTEIN TOTAL: 7 g/dL (ref 6.5–8.3)
SODIUM: 136 mmol/L (ref 135–145)

## 2018-06-28 LAB — CYCLOSPORINE (FPIA) BLOOD: Lab: 52

## 2018-06-28 LAB — PHOSPHORUS: Phosphate:MCnc:Pt:Ser/Plas:Qn:: 2.6 — ABNORMAL LOW

## 2018-06-28 LAB — RED BLOOD CELL COUNT: Lab: 2.46 — ABNORMAL LOW

## 2018-06-28 LAB — BLOOD GAS, ARTERIAL
BASE EXCESS ARTERIAL: 1.5 (ref -2.0–2.0)
HCO3 ARTERIAL: 25 mmol/L (ref 22–27)
O2 SATURATION ARTERIAL: 99.5 % (ref 94.0–100.0)
PH ARTERIAL: 7.5 — ABNORMAL HIGH (ref 7.35–7.45)
PO2 ARTERIAL: 123 mmHg — ABNORMAL HIGH (ref 80.0–110.0)

## 2018-06-28 LAB — APTT
APTT: 29.5 s (ref 25.9–39.5)
Coagulation surface induced:Time:Pt:PPP:Qn:Coag: 29.5

## 2018-06-28 LAB — PROTIME: Lab: 13.2 — ABNORMAL HIGH

## 2018-06-28 LAB — MAGNESIUM: Magnesium:MCnc:Pt:Ser/Plas:Qn:: 2

## 2018-06-28 LAB — ALBUMIN: Albumin:MCnc:Pt:Ser/Plas:Qn:: 2.7 — ABNORMAL LOW

## 2018-06-28 LAB — PH ARTERIAL: pH:LsCnc:Pt:BldA:Qn:: 7.5 — ABNORMAL HIGH

## 2018-06-28 MED ORDER — DEXTROSE 10 % IV BOLUS
INTRAVENOUS | 0 refills | 0 days | PRN
Start: 2018-06-28 — End: ?

## 2018-06-28 MED ORDER — DEXTROSE 5 % AND 0.9 % SODIUM CHLORIDE INTRAVENOUS SOLUTION
INTRAVENOUS | 0 refills | 0 days | PRN
Start: 2018-06-28 — End: ?

## 2018-06-28 MED ORDER — CYCLOSPORINE MODIFIED 100 MG/ML ORAL SOLUTION
Freq: Two times a day (BID) | GASTROSTOMY | 12 refills | 0 days
Start: 2018-06-28 — End: 2019-04-30

## 2018-06-28 MED ORDER — SENNOSIDES 8.6 MG TABLET
ORAL_TABLET | Freq: Every evening | ORAL | 0 refills | 0 days
Start: 2018-06-28 — End: 2018-07-28

## 2018-06-28 MED ORDER — ALBUTEROL SULFATE CONCENTRATE 2.5 MG/0.5 ML SOLUTION FOR NEBULIZATION
Freq: Four times a day (QID) | RESPIRATORY_TRACT | 12 refills | 0.00000 days | PRN
Start: 2018-06-28 — End: 2019-06-28

## 2018-06-28 MED ORDER — SODIUM CHLORIDE 3 % FOR NEBULIZATION
Freq: Three times a day (TID) | RESPIRATORY_TRACT | 12 refills | 0.00000 days
Start: 2018-06-28 — End: 2019-06-28

## 2018-06-28 MED ORDER — HEPARIN (PORCINE) 1,000 UNIT/ML INJECTION SOLUTION
0 refills | 0 days
Start: 2018-06-28 — End: ?

## 2018-06-28 MED ORDER — ACETAMINOPHEN 325 MG TABLET
Freq: Four times a day (QID) | GASTROSTOMY | 0 refills | 0.00000 days | PRN
Start: 2018-06-28 — End: ?

## 2018-06-28 MED ORDER — ONDANSETRON HCL (PF) 4 MG/2 ML INJECTION SOLUTION
Freq: Four times a day (QID) | INTRAVENOUS | 0 refills | 0 days | Status: CP | PRN
Start: 2018-06-28 — End: 2018-07-28

## 2018-06-28 MED ORDER — HEPARIN (PORCINE) 5,000 UNIT/ML INJ SOLUTION (MULTI-VIAL SIZE WRAPPER)
Freq: Three times a day (TID) | SUBCUTANEOUS | 0 refills | 0.00000 days | Status: CP
Start: 2018-06-28 — End: ?

## 2018-06-28 MED ORDER — CHLORHEXIDINE GLUCONATE 0.12 % MOUTHWASH
Freq: Two times a day (BID) | OROMUCOSAL | 0 refills | 0.00000 days
Start: 2018-06-28 — End: ?

## 2018-06-28 MED ORDER — MELATONIN 3 MG TABLET
Freq: Every evening | ORAL | 0 refills | 0 days
Start: 2018-06-28 — End: ?

## 2018-06-28 MED ORDER — ALBUTEROL SULFATE CONCENTRATE 2.5 MG/0.5 ML SOLUTION FOR NEBULIZATION: 3 mg | each | Freq: Four times a day (QID) | 12 refills | 0 days

## 2018-06-28 MED ORDER — OXYCODONE 5 MG TABLET
ORAL_TABLET | Freq: Four times a day (QID) | GASTROSTOMY | 0 refills | 0 days | PRN
Start: 2018-06-28 — End: 2019-05-02

## 2018-06-28 MED ORDER — GENTAMICIN 1 MG/ML, SODIUM CITRATE 4% (2.4 ML) INTRA-CATHETER SYRINGE
4 refills | 0 days
Start: 2018-06-28 — End: ?

## 2018-06-28 MED ORDER — POLYETHYLENE GLYCOL 3350 17 GRAM ORAL POWDER PACKET
PACK | Freq: Two times a day (BID) | ORAL | 0 refills | 0 days
Start: 2018-06-28 — End: 2018-07-28

## 2018-06-28 MED ORDER — INSULIN LISPRO (U-100) 100 UNIT/ML SUBCUTANEOUS SOLUTION
Freq: Four times a day (QID) | SUBCUTANEOUS | 0 refills | 0 days
Start: 2018-06-28 — End: 2018-07-28

## 2018-06-28 NOTE — Unmapped (Addendum)
Pt received in bed awake,able to express needs by mouthing words,on PRVC overnight now on  trach collar at 35%,minimal secretions,oxygen sats at 98%,v/s stable,afebrile,on tube feeds,currently on IHD,pt for back transfer to [place] today,report called,and transport to come at 1p,will continue to monitor.   Pt was also seen by speech and swallow this morning.still not ready for Passy muir valve or swallow study.       Problem: Adult Inpatient Plan of Care  Goal: Plan of Care Review  Outcome: Ongoing - Unchanged  Goal: Patient-Specific Goal (Individualization)  Outcome: Ongoing - Unchanged  Goal: Absence of Hospital-Acquired Illness or Injury  Outcome: Ongoing - Unchanged  Goal: Optimal Comfort and Wellbeing  Outcome: Ongoing - Unchanged  Goal: Readiness for Transition of Care  Outcome: Ongoing - Unchanged  Goal: Rounds/Family Conference  Outcome: Ongoing - Unchanged     Problem: Latex Allergy  Goal: Absence of Allergy Symptoms  Outcome: Ongoing - Unchanged     Problem: Skin Injury Risk Increased  Goal: Skin Health and Integrity  Outcome: Ongoing - Unchanged     Problem: Fall Injury Risk  Goal: Absence of Fall and Fall-Related Injury  Outcome: Ongoing - Unchanged     Problem: Self-Care Deficit  Goal: Improved Ability to Complete Activities of Daily Living  Outcome: Ongoing - Unchanged     Problem: Hypertension Comorbidity  Goal: Blood Pressure in Desired Range  Outcome: Ongoing - Unchanged     Problem: Pain Chronic (Persistent) (Comorbidity Management)  Goal: Acceptable Pain Control and Functional Ability  Outcome: Ongoing - Unchanged     Problem: Fluid Imbalance (Pneumonia)  Goal: Fluid Balance  Outcome: Ongoing - Unchanged     Problem: Infection (Pneumonia)  Goal: Resolution of Infection Signs/Symptoms  Outcome: Ongoing - Unchanged     Problem: Respiratory Compromise (Pneumonia)  Goal: Effective Oxygenation and Ventilation  Outcome: Ongoing - Unchanged     Problem: Device-Related Complication Risk (Hemodialysis) Goal: Safe, Effective Therapy Delivery  Outcome: Ongoing - Unchanged     Problem: Hemodynamic Instability (Hemodialysis)  Goal: Vital Signs Remain in Desired Range  Outcome: Ongoing - Unchanged     Problem: Infection (Hemodialysis)  Goal: Absence of Infection Signs/Symptoms  Outcome: Ongoing - Unchanged     Problem: Communication Impairment (Mechanical Ventilation, Invasive)  Goal: Effective Communication  Outcome: Ongoing - Unchanged     Problem: Noninvasive Ventilation Acute  Goal: Effective Unassisted Ventilation and Oxygenation  Outcome: Ongoing - Unchanged

## 2018-06-28 NOTE — Unmapped (Signed)
Daily Progress Note    Assessment/Plan:  The patient is a 62 y.o. female with a history of  has a past medical history of Chronic kidney disease, Cirrhosis (CMS-HCC), and HTN (hypertension). who presents for the evaluation of ventilator dependence, now s/p tracheostomy tube placement on 9/3    1. Do not cut/change trach ties  2. Do not cut 4-quadrant sutures  3. Extra 4 and 6- cuffed and uncuffed shiley to bedside and obturator at [facility] at all times  4. ENT will perform trach change after off vent for ~48 hours and > POD#5  5. Suction q2h x 8 hrs then q4h or PRN after  6. OK for transfer back to facility ONLY if adequate ENT sevices are available to change and care for tracheostomy tube - this was confirmed with MICU APP, Honey, on 9/4  7. 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    Generalized pain    Altered mental status    Acute-on-chronic kidney injury (CMS-HCC)    Acute respiratory failure with hypoxia (CMS-HCC)    PEA (Pulseless electrical activity) (CMS-HCC)    ARDS (adult respiratory distress syndrome) (CMS-HCC)       LOS: 26 days     Subjective:  POD2, no events related to the trach; tolerated hours of trach collar yesterday and today    Objective:     Vital signs in last 24 hours:  Temp:  [37.3 ??C-38 ??C] 37.3 ??C  Heart Rate:  [61-104] 73  SpO2 Pulse:  [60-97] 73  Resp:  [0-27] 22  A BP-2: (140-177)/(64-90) 158/73  MAP:  [91 mmHg-123 mmHg] 105 mmHg  FiO2 (%):  [30 %-35 %] 30 %  SpO2:  [88 %-100 %] 98 %    Intake/Output last 24 hours:  I/O last 3 completed shifts:  In: 1240 [I.V.:280; NG/GT:960]  Out: 550 [Urine:550]    Physical Exam  General: Awake, alert, oriented sitting upright in bed in NAD.  HEENT: NCAT; HB 1/6; EOMI - sclera and conjunctiva clear; facial sensation intact and symmetric bilaterally in all division of CN 5; OC/OP clear - FOM soft and tongue protrudes midline; 6 CFD trach in place with 4-quadrant sutuers and soft collar with cuff deflated  Pulm: Unlabored - no stridor/stertor; CTAB - no wheezes, rhonchi, rales.    Data Review:  Relevant data reviewed

## 2018-06-28 NOTE — Unmapped (Signed)
Daily Progress Note    Assessment/Plan:  The patient is a 62 y.o. female with a history of  has a past medical history of Chronic kidney disease, Cirrhosis (CMS-HCC), and HTN (hypertension). who presents for the evaluation of ventilator dependence, now s/p tracheostomy tube placement on 9/3    1. Do not cut/change trach ties  2. Do not cut 4-quadrant sutures  3. Extra 4 and 6- cuffed and uncuffed shiley to bedside and obturator at [facility] at all times  4. ENT will perform trach change after off vent for ~48 hours and > POD#5  5. Suction q2h x 8 hrs then q4h or PRN after  6. OK for transfer back to facility ONLY if adequate ENT sevices are available to change and care for tracheostomy tube  7. 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    Generalized pain    Altered mental status    Acute-on-chronic kidney injury (CMS-HCC)    Acute respiratory failure with hypoxia (CMS-HCC)    PEA (Pulseless electrical activity) (CMS-HCC)    ARDS (adult respiratory distress syndrome) (CMS-HCC)       LOS: 25 days     Subjective:  POD1, no events related to the trach; tolerated hours of trach collar yesterday and today    Objective:     Vital signs in last 24 hours:  Temp:  [37.3 ??C-37.7 ??C] 37.5 ??C  Heart Rate:  [67-104] 82  SpO2 Pulse:  [68-88] 82  Resp:  [5-24] 20  A BP-2: (143-177)/(69-90) 177/88  MAP:  [95 mmHg-123 mmHg] 123 mmHg  FiO2 (%):  [30 %-35 %] 30 %  SpO2:  [88 %-100 %] 100 %    Intake/Output last 24 hours:  I/O last 3 completed shifts:  In: 1000 [I.V.:280; NG/GT:720]  Out: 2305 [Urine:300; Other:2000; Blood:5]    Physical Exam  General: Awake, alert, oriented sitting upright in bed in NAD.  HEENT: NCAT; HB 1/6; EOMI - sclera and conjunctiva clear; facial sensation intact and symmetric bilaterally in all division of CN 5; OC/OP clear - FOM soft and tongue protrudes midline; 6 CFD trach in place with 4-quadrant sutuers and soft collar with cuff deflated  Pulm: Unlabored - no stridor/stertor; CTAB - no wheezes, rhonchi, rales.    Data Review:  Relevant data reviewed

## 2018-06-28 NOTE — Unmapped (Signed)
HD Tx 3:30 with UF goal of 2 liters

## 2018-06-28 NOTE — Unmapped (Signed)
Speech Language Trach Speaking Valve Assessment  06/28/2018    Patient Name:  Katherine Norton       Medical Record Number: [id number]   Date of Birth: [birth year]  Sex: Female            SLP Treatment Diagnosis: communication impairment s/p trach  Session Duration : 17  Activity Tolerance: Patient tolerated treatment well    Assessment:   Pt. presents with inability to tolerate speaking valve currently. ~34ml of air removed from pilot balloon. Pt. tolerated cuff deflation w/o difficulty, though appeared anxious about process in general. Digital occlusion attempts x3 resulted in weak, strained voicing w/ moderate back pressure noted w/ digit removal. Pt. then indicated that's enough.  Speaking valve not placed given inability to demonstrate adequate upper airway patency w/ occlusion efforts. Recommend cuff to remain deflated, as tolerated and as medically appropriate. SLP will f/u to re-assess. ? possible impact of edema on performance given recency of trach placement.     Prognosis: Good  Barriers to Discharge: Severity of deficits     Plan of Care:  SLP Follow-up / Frequency: 1x per day, 2-3x week Planned Treatment Duration : during acute stay    Planned Interventions: Speaking Valve Instruction       Treatment Goals:  Pt. will tolerate speaking valve ad lib to aid with communication.   Date Established : 06/28/18   Time Frame : 1 week       Patient and Family Goals: Pt. had no communicated goals.     Discharge Recommendations:  5x weekly               Subjective:  Current Functional Status: 62 y.o. female with  PMHx of liver transplant (1999) on immunosuppresion, chronic back pain, anxiety, HTN, CKD,  that presents to [facility] as a transfer from [facility] on 8/10, with respiratory failure and possible contained duodenal perforation. She was transferred to MICU the following day for vent mngmt since there wasn't any evid of perf per gen surg. Intubated on 8/1 at OSH CT chest with Scattered airspace opacities in the left apex, left upper lobe and superior segment of left lower lobe; concerning for pneumonia.  Extubated 8/12 and reint 8/13 for progressive hypoxia and inc WOB .  Extubated 8/25.  Reintubated on 8/28 am after R sided plug/collapse.  S/p trach 9/3. Pt. currently NPO w/ NGT.                  Communication Preference: Verbal  Patient/Caregiver Reports: I'm tired     Current Facility-Administered Medications   Medication Dose Route Frequency Provider Last Rate Last Dose   ??? acetaminophen (TYLENOL) tablet 650 mg  650 mg NG tube Q6H PRN Rayetta Pigg, MD       ??? albuterol nebulizer solution 2.5 mg  2.5 mg Nebulization Q6H PRN Rayetta Pigg, MD   2.5 mg at 06/21/18 2008   ??? calcitonin (salmon) (FORTICAL/MIACALCIN) nasal spray 1 spray  1 spray Alternating Nares Daily (RT) Rayetta Pigg, MD   1 spray at 06/28/18 0830   ??? chlorhexidine (PERIDEX) 0.12 % solution 5 mL  5 mL Mouth BID Rayetta Pigg, MD   5 mL at 06/28/18 8119   ??? cycloSPORINE (NEORAL) oral microemulsion solution  75 mg NG tube BID Rayetta Pigg, MD   75 mg at 06/28/18 1478   ??? dextrose (D10W) 10% bolus 125 mL  12.5 g Intravenous Q30 Min PRN Rayetta Pigg,  MD 250 mL/hr at 06/28/18 0046 125 mL at 06/28/18 0046   ??? dextrose 5 % and sodium chloride 0.9 % infusion  25 mL/hr Intravenous Continuous PRN Rayetta Pigg, MD   Stopped at 06/23/18 0800   ??? famotidine (PEPCID) tablet 20 mg  20 mg NG tube Daily Rayetta Pigg, MD   20 mg at 06/28/18 0829   ??? gentamicin 1 mg/mL, sodium citrate 4% injection 1.5 mL  1.5 mL hemodialysis port injection Each time in dialysis PRN Honey Monet Gildardo Cranker, ACNP       ??? gentamicin 1 mg/mL, sodium citrate 4% injection 1.7 mL  1.7 mL hemodialysis port injection Each time in dialysis PRN Honey Monet Gildardo Cranker, ACNP       ??? heparin (porcine) 1000 unit/mL injection 1,500 Units  1.5 mL Intra-cannular Each time in dialysis PRN Griffin Dakin, MD   1,500 Units at 06/28/18 1230   ??? heparin (porcine) 1000 unit/mL injection 1,700 Units  1.7 mL Intra-cannular Each time in dialysis PRN Griffin Dakin, MD   1,700 Units at 06/28/18 1230   ??? heparin (porcine) injection 5,000 Units  5,000 Units Subcutaneous [facility] Honey Lynnae January, ACNP   5,000 Units at 06/28/18 1610   ??? insulin lispro (HumaLOG) injection 0-12 Units  0-12 Units Subcutaneous [facility] Rayetta Pigg, MD   Stopped at 06/28/18 1116   ??? lidocaine (LIDODERM) 5 % patch 1 patch  1 patch Transdermal Daily Rayetta Pigg, MD   1 patch at 06/28/18 0829   ??? melatonin tablet 3 mg  3 mg Oral Nightly Rayetta Pigg, MD   3 mg at 06/27/18 2127   ??? MORPhine 4 mg/mL injection 2 mg  2 mg Intravenous Q2H PRN Rayetta Pigg, MD   2 mg at 06/28/18 9604   ??? ondansetron (ZOFRAN) injection 4 mg  4 mg Intravenous Q6H PRN Rayetta Pigg, MD   4 mg at 06/27/18 5409   ??? oxyCODONE (ROXICODONE) immediate release tablet 5 mg  5 mg NG tube Q6H PRN Rayetta Pigg, MD   5 mg at 06/28/18 1114   ??? polyethylene glycol (MIRALAX) packet 17 g  17 g Oral BID Rayetta Pigg, MD   17 g at 06/28/18 0829   ??? senna (SENOKOT) tablet 1 tablet  1 tablet Oral Nightly Rayetta Pigg, MD   1 tablet at 06/23/18 2100   ??? sodium chloride 3 % nebulizer solution 4 mL  4 mL Nebulization TID (RT) Rayetta Pigg, MD   4 mL at 06/28/18 0849     Current Outpatient Medications   Medication Sig Dispense Refill   ??? amLODIPine (NORVASC) 5 MG tablet Take 5 mg by mouth daily.     ??? acetaminophen (TYLENOL) 325 MG tablet 2 tablets (650 mg total) by G-tube route every six (6) hours as needed.  0   ??? albuterol 2.5 mg/0.5 mL nebulizer solution Inhale 0.5 mL (2.5 mg total) by nebulization every six (6) hours as needed for wheezing or shortness of breath. 30 each 12   ??? calcitonin, salmon, (MIACALCIN) 200 unit/actuation nasal spray 1 spray by Alternating Nares route once daily.      ??? chlorhexidine (PERIDEX) 0.12 % solution 5 mL by Mouth route two (2) times a day. 1 Bottle 0   ??? cycloSPORINE (NEORAL) 100 mg/mL microemulsion solution 0.8 mL (80 mg total) by G-tube route two (2) times a day. 50 mL 12   ??? dextrose (  D10W) 10% Soln bolus Infuse 125 mL into a venous catheter every thirty (30) minutes as needed.  0   ??? dextrose 5 % and sodium chloride 0.9 % (D5-NS) infu Infuse 25 mL/hr into a venous catheter continuous as needed (if tube feed held or NPO).  0   ??? [START ON 06/29/2018] famotidine (PEPCID) 20 MG tablet 1 tablet (20 mg total) by G-tube route daily. 30 tablet 0   ??? gentamicin 1 mg/mL, sodium citrate 4% Gent locks with dialysis 2.4 mL 4   ??? heparin sodium,porcine (HEPARIN, PORCINE,) 1,000 unit/mL 1000 unit/mL injection This heparin is for dialysis use ONLY. RN if you are unable to aspirate from both lumes, call provider piror to using the catheter. 1 mL 0   ??? heparin sodium,porcine (HEPARIN, PORCINE,) 5,000 unit/mL injection Inject 1 mL (5,000 Units total) under the skin every eight (8) hours. 1 mL 0   ??? insulin lispro (HUMALOG) 100 unit/mL injection Inject 0-0.12 mL (0-12 Units total) under the skin Every six (6) hours. 1440 Units 0   ??? [START ON 06/29/2018] lidocaine (LIDODERM) 5 % patch Place 1 patch on the skin daily. Apply to affected area for 12 hours only each day (then remove patch) 30 patch 0   ??? melatonin 3 mg Tab Take 1 tablet (3 mg total) by mouth nightly.  0   ??? ondansetron (ZOFRAN) 4 mg/2 mL injection Infuse 2 mL (4 mg total) into a venous catheter every six (6) hours as needed. 20 mL 0   ??? oxyCODONE (ROXICODONE) 5 MG immediate release tablet 1 tablet (5 mg total) by G-tube route every six (6) hours as needed. for up to 5 days 10 tablet 0   ??? polyethylene glycol (MIRALAX) 17 gram packet Take 17 g by mouth Two (2) times a day. 60 packet 0   ??? senna (SENOKOT) 8.6 mg tablet Take 1 tablet by mouth nightly. 30 tablet 0   ??? sodium chloride 3 % nebulizer solution Inhale 4 mL by nebulization 3 (three) times a day. 750 mL 12       Past Medical History:   Diagnosis Date   ??? Chronic kidney disease    ??? Cirrhosis (CMS-HCC)    ??? HTN (hypertension)       Social History     Tobacco Use   ??? Smoking status: Never Smoker   ??? Smokeless tobacco: Never Used   Substance Use Topics   ??? Alcohol use: No     Alcohol/week: 0.0 standard drinks     Past Surgical History:   Procedure Laterality Date   ??? BRONCHOSCOPY  06/03/2018        ??? HYSTERECTOMY     ??? 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 PR TRACHEOSTOMY, PLANNED Midline 06/26/2018    Procedure: TRACHEOSTOMY PLANNED (SEPART PROC);  Surgeon: Theodosia Blender, MD;  Location: MAIN [facility];  Service: ENT   ??? TONSILLECTOMY        Family History   Problem Relation Age of Onset   ??? Hypertension Mother    ??? Hypertension Father    ??? Prostate cancer Father    ??? Kidney disease Neg Hx    ??? Lupus Sister    ??? Lupus Brother        Iodinated contrast media; Ioxaglate sodium; and Latex     Medical Tests / Procedures Comments: CXR 8/28: Interval reexpansion of the right lung.  Pain Comments : RN aware, administered pain meds ~  30 min prior  Equipment/Environment: Supplemental oxygen       Precautions:   Precautions: Aspiration precautions        Objective:  Trach Make and Type: Shiley;Cuffed  Trach Size: 6  Trach Length: Regular length  Trach Status Upon Arrival: No Tracheal Suctioning Needed Prior to Session  Secretion Status: Other (Comment)(no notable secretions)  % Oxygen via TC: 35 %  Vent Support: Weaning with Trach Collar Trials  Test for: Cuff Deflation Pass;Upper Airway Patency Fail;Voicing Fail    Start of Trial  Oxygen Saturation: 100  Pulse: 84  Respiratory Rate: 12    I attest that I have reviewed the above information.  Signed: Susa Griffins, CCC-SLP  Filed 06/28/2018

## 2018-06-28 NOTE — Unmapped (Signed)
Pt has tol vent settings well.  Janina Mayo is secure and easily  passable.  Pt is on atc daily will cont to monitor.

## 2018-06-28 NOTE — Unmapped (Signed)
Physician Discharge Summary    Admit date: 06/02/2018    Discharge date and time: 06/28/2018 13:00    Discharge to: [facility]    Discharge Service: Medical ICU (MDI)    Discharge Attending Physician: Rayetta Pigg, *    Discharge Diagnoses: Chronic respiratory Failure    Procedures: internal jugular line, dialysis line, trach      Hospital Course:  Katherine Norton??is a 62 y.o.??female??with PMHx of liver transplant (1999) on immunosuppresion, chronic back pain, anxiety, HTN, CKD,????that presents to [facility] as a transfer from [facility]. Patient initially presented to the OSH on 05/20/18 due to acute worsening shortness of breath and altered mental status. Patient was sating 68% on RA, but 90% on CPAP. Patient had severe acidemia on ABG as well as acute on chronic renal failure with Cr of 3.89. CXR at that time showed consolidation with concern for pneumonia and was febrile. Patient's initial lactate was 2.70. Patient was started on IV ceftaroline and flagyl initially.  ??  Patient was intubated on 05/24/18 due to worsening respiratory status and needed vasopressors on/off for initial 24 hours. AKI continued to persist despite resuscitation, lasix gtt, and bolus pushes of lasix. Patient is estimated to be approximately 15L positive even with diuresis as most of input is from enteral feeds. Patient underwent bronch on 8/4 and 8/5 for significant mucus plugging. Patient was switched to merrem by ID and d/c levaquin. Fungal, pneumocystis/histoplasma, AFB, Gram on BAL all were negative. Patient had a recent CT Abd/pelvis/chest done which showed a possible contained perforated duodenal ulcer  ??  8/10 patient became increasingly more tachypneic with RR in the 40s. Changes to sedation from precedex to propofol did not help with this. Given if patient would require surgery, the complicated nature of it, decided to transfer to a liver transplant facility (patient had transplant done here). In addition patient's hemoglobin reached a low of 5.8 requiring pRBC transfusion.    Adm to SICU.  rescanned abd and chest.  No perf per surgery and no need for their services and tx to MICU.     Weaned off fentanyl gtt and added scheduled oxy (on home oxy) and extubated but failed, and was a complicated re-intubation. Underwent 1 round of CPR following intubation, likely driven by hypoxia. Mentally intact. Optimizing on vent prior to trialling extubation again vs trach  .     #acute hypoxic respiratory failure likely due to multifocal PNA, ARDS.  Intubated on 8/1 at OSH CT chest with Scattered airspace opacities in the left apex, left upper lobe and superior segment of left lower lobe; concerning for pneumonia.  Extubated 8/12 and reintubated 8/13 for progressive hypoxia and inc WOB .- Esophogeal balloon eval on 8/14 showed optimal PEEP 14.  Kept on PEEP of 14 until pt extubated 8/25.  Pt required intermittent hiflo and bipap and then r eintubated on 8/28 am after R sided plug/collpase.    S/p Trach by ENT on 9/3.  Has been doing TC - 7 hrs on 9/4 and rested on PRVC overnight.  Returned to [facility] this am.    - tolerating PRVC well >> TCT as tol  - peridex for vap ppx  - aggressive pulm toilet as tol  - hypertonic nebs  - speech therapy consulted for PMV trials >> will reassess 48hrs post trach     Per ENT: please do not change trach until POD5 and when stable off the ventilator for at least 48 hours    #  acute HFrEF  #h/o hypertension  # PEA arrest peri-intubation #1 (CPR and epi, ROSC within 1 min)   - hold home metoprolol and amlodipine   - prn hydralazine  - Echo 7/29: EF 65-70%, mod pulm HTN and borderline dilated RV   - repeat echo 8/16 EF >55%, severe RV fx and dilated RA, severe phtn, mod TR    #Acute on chronic renal failure  - Cr baseline appears to be ~1.7--> 3-->2.87  - nephrology consulted 8/16, appreciate recs >> CRRT started 8/22, clotted off 8/25. First iHD on 8/27 with 2.5 L removed.  Tolerated well.      - foley removed 8/27, consider bladder scan daily  - last iHD tolerated well for 2L UF, next 9/5 am prior to transfer  - cont to have intmt incontinent voids, consider purewick for accurate I/O     #septic shock due to ? PNA.  Initially seen at OSH and had c/f perforated duodenal ulcer, but CT scan here non concerning.  CT chest with possible multifocal PNA.    - appreciate ID's assistance. ID signed off 8/17  - WOCN consulted for intertrigo and sacral wound (see recs 8/19)  - last Blood/urine/sputum cxs sent 8/21, abx given x 1 dose  ??  Abx:   Cefepime (8/22- 8/22)  Fluconazole (8/22- 8/22)  Vanco (8/11-->8/12)  Mica (8/11-->8/12)  Flagyl (8/10-->8/14)  Cefepime  (8/10-->8/16)  Voriconazole (8/12-->8/15)  Linezolid (8/15-8/16)  ??  Abx: OSH  ceftaroline and flagyl  levaquin  meropenem     Cx OSH:  - 7/29 Strep pneumo Ag neg, RVP neg, urine Legionella Ag neg  - 7/29 bd cx NGTD  OSH: Fungal, pneumocystis/histoplasma, AFB, Gram on BAL all were negative.    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  -  transplant team following   - cylcosporin 75 mg bid   - voriconazole ended 8/15    #ileus  - n/v noted overnight 8/25, zofran given, TF held  - KUB reveals dilated loops of bowel concerning for ileus  - cont NG to LIWS and hold TF >> Bowel regimen, with + BM   - resume TF on 8/28:  Vital AF @ 65 ml/hr with 30 cc flushes q 4 hrs    #Critical illness anemia  - last transfusion 2 units PRBC 8/20-8/21 and 1unit 8/26, no s/s of bleeding  - SCDs and heparin for DVT ppx    Pt may have PRN morphine during transport:  2 mg IV every 2 hrs PRN pain     Condition at Discharge: stable  Discharge Medications:      Your Medication List      ASK your doctor about these medications    albuterol 90 mcg/actuation inhaler  Commonly known as:  PROVENTIL HFA;VENTOLIN HFA  Inhale 1-2 puffs every six (6) hours as needed for wheezing.     calcitonin (salmon) 200 unit/actuation nasal spray  Commonly known as:  FORTICAL/MIACALCIN  1 spray by Alternating Nares route once daily.     metoprolol succinate 50 MG 24 hr tablet  Commonly known as:  TOPROL-XL  Take 50 mg by mouth daily.     NEORAL 25 MG capsule  Generic drug:  cycloSPORINE modified  TAKE 2 CAPSULES (50MG ) BY MOUTH TWICE DAILY     oxyCODONE 15 MG immediate release tablet  Commonly known as:  ROXICODONE  Take 1 tablet (15 mg total) by mouth 4 (four) times a day as needed.     VITAMIN D3  1,000 unit capsule  Generic drug:  cholecalciferol (vitamin D3)  Take 1,000 Units by mouth daily. Frequency:   Dosage:0   UNIT  Instructions:  Note:1 po daily            Pending Test Results:      Order Current Status    Cyclosporine Level, Timed In process    AFB culture Preliminary result    Blood Culture, Mould Preliminary result          Discharge Instructions:   Pt may have prn morphine during transport:  Morphine 2 mg IV q 2 hrs PRN pain.           I spent less than 30 minutes in the discharge of this patient.

## 2018-06-28 NOTE — Unmapped (Signed)
HEMODIALYSIS NURSE PROCEDURE NOTE    Treatment Number:  4 Room/Station:  Critical Care (Specify Unit & Room)(MICU 4327) Procedure Date:  06/28/18   Total Treatment Time:  210 Min.    CONSENT:  Written consent was obtained prior to the procedure and is detailed in the medical record. Prior to the start of the procedure, a time out was taken and the identity of the patient was confirmed via name, medical record number and date of birth.     WEIGHTS:  Hemodialysis Pre-Treatment Weights     Date/Time Pre-Treatment Weight (kg) Estimated Dry Weight (kg) Patient Goal Weight (kg) Total Goal Weight (kg)    06/28/18 0838  ??? Unable to assess, pt on vent  ??? TBD  2 kg (4 lb 6.6 oz)  2.55 kg (5 lb 10 oz)           Hemodialysis Post Treatment Weights     Date/Time Post-Treatment Weight (kg) Treatment Weight Change (kg)    06/28/18 1230  ??? Unable to weigh  ???        Active Dialysis Orders (168h ago, onward)     Start     Ordered    06/28/18 0823  Hemodialysis inpatient  Every Tue,Thu,Sat     Question Answer Comment   K+ 3 meq/L    Ca++ 2.5 meq/L    Bicarb 35 meq/L    Na+ 137 meq/L    Na+ Modeling none    Dialyzer F180NR    Dialysate Temperature (C) 36    BFR-As tolerated to a maximum of: 400 mL/min    DFR 800 mL/min    Duration of treatment 3.5 Hr    Dry weight (kg) TBD    Challenge dry weight (kg) NO    Fluid removal (L) 2L ON 9/5    Tubing Adult = 142 ml    Access Site Dialysis Catheter    Access Site Location Right    Keep SBP >: 90        06/28/18 0823    06/23/18 0700  Hemodialysis inpatient  Every Tue,Thu,Sat,   Status:  Canceled     Question Answer Comment   K+ 3 meq/L    Ca++ 2.5 meq/L    Bicarb 35 meq/L    Na+ 137 meq/L    Na+ Modeling none    Dialyzer F180NR    Dialysate Temperature (C) 36    BFR-As tolerated to a maximum of: 400 mL/min    DFR 800 mL/min    Duration of treatment 3.5 Hr    Dry weight (kg) TBD    Challenge dry weight (kg) NO    Fluid removal (L) 3L ON 8/29    Tubing Adult = 142 ml    Access Site Dialysis Catheter    Access Site Location Right    Keep SBP >: 90        06/21/18 0644              ACCESS SITE:       Hemodialysis Catheter 06/14/18 Left Internal jugular (Active)   Site Assessment Clean;Dry;Intact 06/28/2018 12:35 PM   Status Deaccessed 06/28/2018 12:35 PM   Dressing Intervention New dressing 06/28/2018 12:00 PM   Dressing Status      Clean;Dry;Intact/not removed 06/28/2018 12:35 PM   Verification by X-ray Yes 06/28/2018  4:00 AM   Site Condition No complications 06/28/2018 12:35 PM   Dressing Type Transparent;Occlusive;Antimicrobial dressing 06/28/2018 12:35 PM   Dressing Drainage Description Sanguineous 06/28/2018 12:35  PM   Dressing Change Due 07/05/18 06/28/2018 12:35 PM   Line Necessity Reviewed? Y 06/28/2018 12:35 PM   Line Necessity Indications Yes - Hemodialysis 06/28/2018 12:35 PM   Line Necessity Reviewed With Nephrologist 06/28/2018 12:35 PM           Catheter Fill Volumes:  Arterial:  1.5 mL Venous:  1.7 mL   Catheter filled with 1:1000 units Heparin post procedure.    Patient Lines/Drains/Airways Status    Active Peripheral & Central Intravenous Access     Name:   Placement date:   Placement time:   Site:   Days:    CVC Triple Lumen 06/03/18 Non-tunneled Right Internal jugular   06/03/18    1503    Internal jugular   24              LAB RESULTS:  Lab Results   Component Value Date    NA 136 06/28/2018    K 4.2 06/28/2018    CL 103 06/28/2018    CO2 23.0 06/28/2018    BUN 18 06/28/2018    CALCIUM 8.5 06/28/2018    CAION 4.63 06/23/2018    PHOS 2.6 (L) 06/28/2018    MG 2.0 06/28/2018    PTH 293.0 12/05/2017    TOPIRAMATE 12.2 12/05/2017    IRON 202 (H) 05/30/2013    LABIRON >100 (H) 05/30/2013    TRANSFERRIN 138 (L) 05/30/2013    FERRITIN 944 01/07/2010    TIBC 174 (L) 05/30/2013     Lab Results   Component Value Date    WBC 1.7 (L) 06/28/2018    HGB 7.6 (L) 06/28/2018    HCT 23.0 (L) 06/28/2018    PLT 170 06/28/2018    PHART 7.50 (H) 06/28/2018    PO2ART 123.0 (H) 06/28/2018    PCO2ART 32.1 (L) 06/28/2018    HCO3ART 25 06/28/2018    BEART 1.5 06/28/2018    O2SATART 99.5 06/28/2018    APTT 29.5 06/28/2018    HEPBSAG Negative 10/09/2014      VITAL SIGNS:  Temperature     Date/Time Temp Temp src      06/28/18 1235  36.8 ??C (98.2 ??F)  Oral         Hemodynamics     Date/Time Pulse BP MAP (mmHg) Patient Position    06/28/18 1235  80  ???  ???  ???    06/28/18 1230  79  ???  ???  ???    06/28/18 1215  84  ???  ???  ???    06/28/18 1200  75  ???  ???  ???    06/28/18 1145  80  ???  ???  ???    06/28/18 1130  87  ???  ???  ???    06/28/18 1115  77  ???  ???  ???    06/28/18 1114  77  ???  ???  ???    06/28/18 1100  87  ???  ???  ???    06/28/18 1055  79  ???  ???  ???    06/28/18 1045  74  ???  ???  ???    06/28/18 1030  76  ???  ???  ???    06/28/18 1021  78  ???  ???  ???    06/28/18 1015  78  ???  ???  ???    06/28/18 1000  79  ???  ???  ???    06/28/18 0945  70  ???  ???  ???  06/28/18 0930  78  ???  ???  ???    06/28/18 0915  75  ???  ???  ???    06/28/18 0900  80  ???  ???  ???          Oxygen Therapy     Date/Time Resp SpO2 O2 Device FiO2 (%) O2 Flow Rate (L/min)    06/28/18 1235  14  100 %  Trach mask  30 %  ???    06/28/18 1230  21  100 %  ???  30 %  ???    06/28/18 1215  21  100 %  Trach mask  30 %  ???    06/28/18 1200  23  100 %  Trach mask  30 %  ???    06/28/18 1145  21  100 %  Trach mask  30 %  ???    06/28/18 1130  15  100 %  Trach mask  30 %  ???    06/28/18 1115  17  100 %  Trach mask  30 %  ???    06/28/18 1114  21  100 %  ???  30 %  ???    06/28/18 1100  14  100 %  Trach mask  30 % Simultaneous filing. User may not have seen previous data.  ???    06/28/18 1055  18  100 %  ???  40 %  8 L/min    06/28/18 1045  18  99 %  Trach mask  30 %  ???    06/28/18 1030  21  100 %  Trach mask  30 %  ???    06/28/18 1021  18  99 %  ???  30 %  ???    06/28/18 1015  12  100 %  Trach mask  30 %  ???    06/28/18 1000  11  100 %  Trach mask  30 %  ???    06/28/18 0945  22  100 %  Ventilator  30 %  ???    06/28/18 0930  22  100 %  Ventilator  30 %  ???    06/28/18 0915  18  100 %  Ventilator  30 %  ???    06/28/18 0900  22  100 %  Ventilator  30 %  ???        Oxygen Connected to Wall:  yes Pre-Hemodialysis Assessment     Date/Time Therapy Number Dialyzer All Psychologist, counselling Dialysis Flow (mL/min)    06/28/18 1051  ???  ???  ???  ???  ???    06/28/18 0838  ???  F-180 (98 mLs)  Yes  Engaged  800 mL/min    Date/Time Verify Priming Solution Priming Volume Hemodialysis Independent pH Hemodialysis Machine Conductivity (mS/cm) Hemodialysis Independent Conductivity (mS/cm)    06/28/18 1051  ???  ???  7.1  13.7 mS/cm  13.8 mS/cm    06/28/18 0838  0.9% NS  300 mL  7  13.7 mS/cm  13.9 mS/cm    Date/Time Bicarb Conductivity Residual Bleach Negative Free Chlorine Total Chlorine Chloramine    06/28/18 1051 --  ??? --  ??? --    06/28/18 0838 --  Yes --  0 --        Pre-Hemodialysis Treatment Comments     Date/Time Pre-Hemodialysis Comments    06/28/18 0838  Alert, stable        Hemodialysis Treatment  Date/Time Blood Flow Rate (mL/min) Arterial Pressure (mmHg) Venous Pressure (mmHg) Transmembrane Pressure (mmHg)    06/28/18 1215  400 mL/min  -430 mmHg  140 mmHg  40 mmHg    06/28/18 1200  400 mL/min  -120 mmHg  140 mmHg  40 mmHg    06/28/18 1145  400 mL/min  -120 mmHg  130 mmHg  40 mmHg    06/28/18 1130  400 mL/min  -120 mmHg  140 mmHg  40 mmHg    06/28/18 1115  400 mL/min  -140 mmHg  130 mmHg  40 mmHg    06/28/18 1100  400 mL/min  -150 mmHg  130 mmHg  40 mmHg    06/28/18 1045  400 mL/min  -150 mmHg  140 mmHg  40 mmHg    06/28/18 1030  400 mL/min  -140 mmHg  130 mmHg  40 mmHg    06/28/18 1015  400 mL/min  -140 mmHg  130 mmHg  40 mmHg    06/28/18 1000  400 mL/min  -140 mmHg  130 mmHg  40 mmHg    06/28/18 0945  400 mL/min  -140 mmHg  130 mmHg  40 mmHg    06/28/18 0930  400 mL/min  -140 mmHg  130 mmHg  40 mmHg    06/28/18 0915  400 mL/min  -130 mmHg  120 mmHg  40 mmHg    06/28/18 0900  400 mL/min  -130 mmHg  120 mmHg  40 mmHg    Date/Time Ultrafiltration Rate (mL/hr) Ultrafiltrate Removed (mL) Dialysate Flow Rate (mL/min) KECN (Kecn)    06/28/18 1215  930 mL/hr  2672 mL  800 ml/min  ???    06/28/18 1200  930 mL/hr 2556 mL  800 ml/min  ???    06/28/18 1145  930 mL/hr  2270 mL  800 ml/min  ???    06/28/18 1130  930 mL/hr  2025 mL  800 ml/min  ???    06/28/18 1115  930 mL/hr  1810 mL  800 ml/min  ???    06/28/18 1100  810 mL/hr  1580 mL  800 ml/min  ???    06/28/18 1045  810 mL/hr  1132 mL  800 ml/min  ???    06/28/18 1030  810 mL/hr  1132 mL  800 ml/min  ???    06/28/18 1015  810 mL/hr  810 mL  800 ml/min  ???    06/28/18 1000  730 mL/hr  775 mL  800 ml/min  ???    06/28/18 0945  730 mL/hr  567 mL  800 ml/min  ???    06/28/18 0930  730 mL/hr  415 mL  800 ml/min  ???    06/28/18 0915  730 mL/hr  230 mL  800 ml/min  ???    06/28/18 0900  730 mL/hr  0 mL  800 ml/min  ???        Hemodialysis Treatment Comments     Date/Time Intra-Hemodialysis Comments    06/28/18 1235  Post HD vital signs    06/28/18 1230  Rinseback completed    06/28/18 1215  Alert, stable    06/28/18 1200  Alert, stable    06/28/18 1145  Alert, SLP at bedside    06/28/18 1130  Stable    06/28/18 1115  Alert, stable    06/28/18 1100  Alert, floor RN notified of need for pain medication    06/28/18 1045  Alert. stable    06/28/18 1030  Stable  06/28/18 1021  Dr, True at bedside    06/28/18 1015  Stable    06/28/18 1000  Stable    06/28/18 0945  Alert, stable    06/28/18 0930  Alert, stable    06/28/18 0915  Watching TV, stable    06/28/18 0900  Tx initiated with lines reversed    06/28/18 0840  Pre HD vital signs        Post Treatment     Date/Time Rinseback Volume (mL) On Line Clearance: spKt/V Total Liters Processed (L/min) Dialyzer Clearance    06/28/18 1230  300 mL  ???  79.5 L/min  Moderately streaked        Post Hemodialysis Treatment Comments     Date/Time Post-Hemodialysis Comments    06/28/18 1230  Alert, stable        POST TREATMENT ASSESSMENT:  General appearance:  alert  Neurological:  Grossly normal  Lungs:  clear to auscultation bilaterally  Hearts:  regular rate and rhythm, S1, S2 normal, no murmur, click, rub or gallop  Abdomen:  soft, non-tender; bowel sounds normal; no masses,  no organomegaly    Hemodialysis I/O     Date/Time Total Hemodialysis Replacement Volume (mL) Total Ultrafiltrate Output (mL)    06/28/18 1230  ???  2000 mL        [id number] - Medicaitons Given During Treatment  (last 4 hrs)         ANNA Farrel Demark, RN       Medication Name Action Time Action Route Rate Dose User     insulin lispro (HumaLOG) injection 0-12 Units 06/28/18 1116 Hold Subcutaneous   Ivan Anchors, RN     oxyCODONE (ROXICODONE) immediate release tablet 5 mg 06/28/18 1114 Given NG tube  5 mg Ivan Anchors, RN          Burnice Logan, RN       Medication Name Action Time Action Route Rate Dose User     heparin (porcine) 1000 unit/mL injection 1,500 Units 06/28/18 1230 Given Intra-cannular  1,500 Units Burnice Logan, RN     heparin (porcine) 1000 unit/mL injection 1,700 Units 06/28/18 1230 Given Intra-cannular  1,700 Units Burnice Logan, RN

## 2018-06-28 NOTE — Unmapped (Signed)
HEMODIALYSIS INTRA-PROCEDURE NOTE    Patient Katherine Norton was seen and examined on hemodialysis June 28, 2018.    CHIEF COMPLAINT:  Acute Kidney Disease    INTERVAL HISTORY:   Remains trached/intubated. Back transfer planned for today after dialysis.    PHYSICAL EXAM:  Vitals:  Temp:  [36.8 ??C (98.2 ??F)-38 ??C (100.4 ??F)] 36.8 ??C (98.2 ??F)  Heart Rate:  [61-104] 80  SpO2 Pulse:  [60-97] 77  MAP:  [91 mmHg-138 mmHg] 105 mmHg  A BP-2: (140-199)/(64-96) 159/73  MAP:  [91 mmHg-138 mmHg] 105 mmHg    Weights:  Admission Weight: 92.5 kg (203 lb 14.8 oz)  Last documented Weight: 86 kg (189 lb 9.5 oz)  Weight Change from Previous Day: No weight listed for specified days    Assessment:  General: Appearing intubated  Pulmonary: rhonchi  Cardiovascular: regular rate and rhythm  Extremities:   1+ edema to calf     ACCESS:       LAB DATA:  Lab Results   Component Value Date    NA 136 06/28/2018    K 4.2 06/28/2018    CL 103 06/28/2018    CO2 23.0 06/28/2018    BUN 18 06/28/2018    CREATININE 3.18 (H) 06/28/2018    CALCIUM 8.5 06/28/2018    PHOS 2.6 (L) 06/28/2018    ALBUMIN 2.7 (L) 06/28/2018     Lab Results   Component Value Date    HGB 7.6 (L) 06/28/2018    HCT 23.0 (L) 06/28/2018    PLT 170 06/28/2018       PLAN:  Ultrafiltration Goal:  2 L

## 2018-06-29 MED ORDER — FAMOTIDINE 20 MG TABLET
ORAL_TABLET | Freq: Every day | GASTROSTOMY | 0 refills | 0 days
Start: 2018-06-29 — End: 2019-05-02

## 2018-06-29 MED ORDER — LIDOCAINE 5 % TOPICAL PATCH
MEDICATED_PATCH | Freq: Every day | TRANSDERMAL | 0 refills | 0 days
Start: 2018-06-29 — End: 2018-07-29

## 2018-07-06 NOTE — Unmapped (Signed)
Medicine Access Physician (MAP): Transfer Center Request Note    Requesting Physician: Dr Lucianne Muss    [facility]: [facility]    Requesting Service: Hospitalist    Reason for Transfer: continuity of care and placement    Brief Hospital Course:   63yo female previously followed at [facility], initially admitted 7/28 to [facility], transferred to [facility] 06/02/2018, back transferred 06/28/2018. Placed trach at [facility], back transfer 9/5. Downgraded to stepdown today (ie improving).    Family questioning why back transferred to [place]. Family wants back transferred to [facility] for placement at [facility].     Discussed case with provider at length. Provider noted that pt is immunocompromised but could not identify new active medical issue that would require back transfer to [facility] and higher level of care. He summarized his request for transfer as being for continuity of care and family request for placement nearer to them in [place].      Per progress note 07/06/2018 by Dr Ellyn Hack, Plan for transfer to step down bed at [facility] in next 24 hours for appropriate placement to SNF [place]. Family wishes for transfer given they want SNF placement in [facility] area.    Based on this information, the transfer is largely for family preference and placement.  Given that we have a long wait list at this time, I am declining this transfer request. I have told the provider if there is a need for a higher level of care for medical reasons please contact us but I cannot accept the patient at this time for the reasons stated.

## 2018-07-10 NOTE — Unmapped (Signed)
Confirmed with local provider that patient is to have a CSA goal of 50-100.  Provided the [place] consult phone number if provider to provider conversation is needed.

## 2018-07-11 ENCOUNTER — Inpatient Hospital Stay: Payer: Medicaid Other

## 2018-07-11 ENCOUNTER — Inpatient Hospital Stay: Payer: Medicaid Other | Attending: Family | Admitting: Family

## 2018-07-11 ENCOUNTER — Ambulatory Visit: Payer: Medicaid Other

## 2018-07-20 ENCOUNTER — Other Ambulatory Visit: Payer: Self-pay

## 2018-07-20 DIAGNOSIS — N189 Chronic kidney disease, unspecified: Principal | ICD-10-CM

## 2018-07-20 DIAGNOSIS — D61818 Other pancytopenia: Secondary | ICD-10-CM

## 2018-07-20 DIAGNOSIS — D631 Anemia in chronic kidney disease: Secondary | ICD-10-CM

## 2018-07-20 MED ORDER — ACETAMINOPHEN 325 MG PO TABS
650.00 | ORAL_TABLET | ORAL | Status: DC
Start: ? — End: 2018-07-20

## 2018-07-20 MED ORDER — PANTOPRAZOLE SODIUM 40 MG PO TBEC
40.00 | DELAYED_RELEASE_TABLET | ORAL | Status: DC
Start: 2018-07-20 — End: 2018-07-20

## 2018-07-20 MED ORDER — HEPARIN SODIUM (PORCINE) 1000 UNIT/ML IJ SOLN
5000.00 | INTRAMUSCULAR | Status: DC
Start: ? — End: 2018-07-20

## 2018-07-20 MED ORDER — LORAZEPAM 0.5 MG PO TABS
0.50 | ORAL_TABLET | ORAL | Status: DC
Start: ? — End: 2018-07-20

## 2018-07-20 MED ORDER — PANCRELIPASE (LIP-PROT-AMYL) 24000-76000 UNITS PO CPEP
24000.00 | ORAL_CAPSULE | ORAL | Status: DC
Start: 2018-07-20 — End: 2018-07-20

## 2018-07-20 MED ORDER — HEPARIN SODIUM (PORCINE) 5000 UNIT/ML IJ SOLN
2.00 | INTRAMUSCULAR | Status: DC
Start: ? — End: 2018-07-20

## 2018-07-20 MED ORDER — GENERIC EXTERNAL MEDICATION
5.00 | Status: DC
Start: ? — End: 2018-07-20

## 2018-07-20 MED ORDER — DIPHENHYDRAMINE HCL 25 MG PO CAPS
25.00 | ORAL_CAPSULE | ORAL | Status: DC
Start: ? — End: 2018-07-20

## 2018-07-20 MED ORDER — ALBUTEROL SULFATE HFA 108 (90 BASE) MCG/ACT IN AERS
4.00 | INHALATION_SPRAY | RESPIRATORY_TRACT | Status: DC
Start: 2018-07-20 — End: 2018-07-20

## 2018-07-20 MED ORDER — NYSTATIN 100000 UNIT/GM EX POWD
CUTANEOUS | Status: DC
Start: 2018-07-20 — End: 2018-07-20

## 2018-07-20 MED ORDER — HEPARIN SODIUM (PORCINE) 5000 UNIT/ML IJ SOLN
1.00 | INTRAMUSCULAR | Status: DC
Start: ? — End: 2018-07-20

## 2018-07-20 MED ORDER — OXYCODONE HCL 10 MG PO TABS: 10.0000 mg | ORAL_TABLET | Freq: Four times a day (QID) | ORAL | 0 refills | Status: DC

## 2018-07-20 MED ORDER — LOPERAMIDE HCL 2 MG PO CAPS
2.00 | ORAL_CAPSULE | ORAL | Status: DC
Start: ? — End: 2018-07-20

## 2018-07-20 MED ORDER — OXYCODONE HCL 5 MG PO TABS
5.00 | ORAL_TABLET | ORAL | Status: DC
Start: ? — End: 2018-07-20

## 2018-07-20 MED ORDER — LABETALOL HCL 100 MG PO TABS
100.00 | ORAL_TABLET | ORAL | Status: DC
Start: 2018-07-20 — End: 2018-07-20

## 2018-07-20 MED ORDER — HYDRALAZINE HCL 20 MG/ML IJ SOLN
20.00 | INTRAMUSCULAR | Status: DC
Start: ? — End: 2018-07-20

## 2018-07-20 MED ORDER — SODIUM CHLORIDE FLUSH 0.9 % IV SOLN
5.00 | INTRAVENOUS | Status: DC
Start: ? — End: 2018-07-20

## 2018-07-20 MED ORDER — OXYCODONE HCL ER 10 MG PO T12A: 10.0000 mg | EXTENDED_RELEASE_TABLET | Freq: Two times a day (BID) | ORAL | 0 refills | Status: DC

## 2018-07-20 MED ORDER — OXYCODONE HCL ER 10 MG PO T12A
10.00 | EXTENDED_RELEASE_TABLET | ORAL | Status: DC
Start: 2018-07-20 — End: 2018-07-20

## 2018-07-20 MED ORDER — CLONIDINE HCL 0.1 MG PO TABS
0.10 | ORAL_TABLET | ORAL | Status: DC
Start: ? — End: 2018-07-20

## 2018-07-20 MED ORDER — HYDRALAZINE HCL 20 MG/ML IJ SOLN
10.00 | INTRAMUSCULAR | Status: DC
Start: ? — End: 2018-07-20

## 2018-07-20 MED ORDER — EPOETIN ALFA 10000 UNIT/ML IJ SOLN
10000.00 | INTRAMUSCULAR | Status: DC
Start: 2018-07-22 — End: 2018-07-20

## 2018-07-20 MED ORDER — ONDANSETRON HCL 4 MG/2ML IJ SOLN
4.00 | INTRAMUSCULAR | Status: DC
Start: ? — End: 2018-07-20

## 2018-07-20 MED ORDER — NITROGLYCERIN 0.4 MG SL SUBL
0.40 | SUBLINGUAL_TABLET | SUBLINGUAL | Status: DC
Start: ? — End: 2018-07-20

## 2018-07-20 MED ORDER — CYCLOSPORINE MODIFIED (NEORAL) 25 MG PO CAPS
75.00 | ORAL_CAPSULE | ORAL | Status: DC
Start: 2018-07-20 — End: 2018-07-20

## 2018-07-20 MED ORDER — MELATONIN 3 MG PO TABS
6.00 | ORAL_TABLET | ORAL | Status: DC
Start: 2018-07-20 — End: 2018-07-20

## 2018-07-20 MED ORDER — SODIUM CHLORIDE FLUSH 0.9 % IV SOLN
5.00 | INTRAVENOUS | Status: DC
Start: 2018-07-20 — End: 2018-07-20

## 2018-07-20 MED ORDER — SENNOSIDES-DOCUSATE SODIUM 8.6-50 MG PO TABS
1.00 | ORAL_TABLET | ORAL | Status: DC
Start: ? — End: 2018-07-20

## 2018-07-20 MED ORDER — FENTANYL 25 MCG/HR TD PT72
1.00 | MEDICATED_PATCH | TRANSDERMAL | Status: DC
Start: 2018-07-23 — End: 2018-07-20

## 2018-07-20 MED ORDER — LIDOCAINE 4 % EX PTCH
1.00 | MEDICATED_PATCH | CUTANEOUS | Status: DC
Start: 2018-07-21 — End: 2018-07-20

## 2018-07-20 NOTE — Telephone Encounter (Signed)
Rx faxed to [facility] (P) [phone], (F) [phone] 

## 2018-07-24 ENCOUNTER — Non-Acute Institutional Stay (SKILLED_NURSING_FACILITY): Payer: Medicaid Other | Admitting: Adult Health

## 2018-07-24 ENCOUNTER — Encounter: Payer: Self-pay | Admitting: Adult Health

## 2018-07-24 DIAGNOSIS — K8689 Other specified diseases of pancreas: Secondary | ICD-10-CM

## 2018-07-24 DIAGNOSIS — D899 Disorder involving the immune mechanism, unspecified: Secondary | ICD-10-CM

## 2018-07-24 DIAGNOSIS — E1122 Type 2 diabetes mellitus with diabetic chronic kidney disease: Secondary | ICD-10-CM | POA: Diagnosis not present

## 2018-07-24 DIAGNOSIS — J9611 Chronic respiratory failure with hypoxia: Secondary | ICD-10-CM | POA: Diagnosis not present

## 2018-07-24 DIAGNOSIS — M810 Age-related osteoporosis without current pathological fracture: Secondary | ICD-10-CM

## 2018-07-24 DIAGNOSIS — D631 Anemia in chronic kidney disease: Secondary | ICD-10-CM

## 2018-07-24 DIAGNOSIS — I10 Essential (primary) hypertension: Secondary | ICD-10-CM

## 2018-07-24 DIAGNOSIS — M5137 Other intervertebral disc degeneration, lumbosacral region: Secondary | ICD-10-CM

## 2018-07-24 DIAGNOSIS — F418 Other specified anxiety disorders: Secondary | ICD-10-CM

## 2018-07-24 DIAGNOSIS — M1A30X0 Chronic gout due to renal impairment, unspecified site, without tophus (tophi): Secondary | ICD-10-CM

## 2018-07-24 DIAGNOSIS — N186 End stage renal disease: Secondary | ICD-10-CM

## 2018-07-24 DIAGNOSIS — Z992 Dependence on renal dialysis: Secondary | ICD-10-CM

## 2018-07-24 DIAGNOSIS — Z944 Liver transplant status: Secondary | ICD-10-CM

## 2018-07-24 DIAGNOSIS — N189 Chronic kidney disease, unspecified: Secondary | ICD-10-CM

## 2018-07-24 DIAGNOSIS — D849 Immunodeficiency, unspecified: Secondary | ICD-10-CM

## 2018-07-24 NOTE — Progress Notes (Signed)
Location:   [facility] Room Number: 102 A Place of Service:  SNF (31)   CODE STATUS: Full Code  Allergies  Allergen Reactions  . Iodinated Diagnostic Agents   . Ioxaglate   . Latex     Chief Complaint  Patient presents with  . Hospitalization Follow-up    Hospital follow up    HPI:  She is a 62 year old woman who was hospitalized at [facility] from 06-28-18 through 9-27/19. She was hospitalized for acute on chronic respiratory failure; which required a tracheostomy which as been removed. She did require a peg tube for nutritional support which is not being used any longer. She is here for short term rehab with her goal to return back home. She has chronic back pain; which is being managed. She denies any fevers; no changes in appetite. She does have a dialysis access on her right chest. She will continue to be followed for her chronic illnesses including: osteoporosis; gout; chronic pain.   Past Medical History:  Diagnosis Date  . Arthritis   . Gout   . 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 Hypertension     Past Surgical History:  Procedure Laterality Date  . ABDOMINAL HYSTERECTOMY    . LIVER TRANSPLANT     1996  . TONSILLECTOMY      Social History   Socioeconomic History  . Marital status: Single    Spouse name: Not on file  . Number of children: Not on file  . Years of education: Not on file  . Highest education level: Not on file  Occupational History  . Not on file  Social Needs  . Financial resource strain: Not on file  . Food insecurity:    Worry: Not on file    Inability: Not on file  . Transportation needs:    Medical: Not on file    Non-medical: Not on file  Tobacco Use  . Smoking status: Never Smoker  . Smokeless tobacco: Never Used  . Tobacco comment: never used tobacco  Substance and Sexual Activity  . Alcohol use: Not Currently    Alcohol/week: 0.0 standard drinks  . Drug use: No  . Sexual activity: Not on  file  Lifestyle  . Physical activity:    Days per week: Not on file    Minutes per session: Not on file  . Stress: Not on file  Relationships  . Social connections:    Talks on phone: Not on file    Gets together: Not on file    Attends religious service: Not on file    Active member of club or organization: Not on file    Attends meetings of clubs or organizations: Not on file    Relationship status: Not on file  . Intimate partner violence:    Fear of current or ex partner: Not on file    Emotionally abused: Not on file    Physically abused: Not on file    Forced sexual activity: Not on file  Other Topics Concern  . Not on file  Social History Narrative  . Not on file   Family History  Problem Relation Age of Onset  . Stroke Mother   . Cancer Father       VITAL SIGNS BP (!) 165/84   Pulse 77   Temp 97.9 F (36.6 C)   Resp 18   Ht 5\' 2"  (1.575 m)   Wt 148 lb 6.4 oz (67.3 kg)  SpO2 96%   BMI 27.14 kg/m   Outpatient Encounter Medications as of 07/24/2018  Medication Sig Note  . albuterol (PROVENTIL HFA;VENTOLIN HFA) 108 (90 Base) MCG/ACT inhaler Inhale 2 puffs into the lungs every 6 (six) hours as needed.    Marland Kitchen alendronate (FOSAMAX) 70 MG tablet Take 70 mg by mouth once a week. Give on Monday's   . allopurinol (ZYLOPRIM) 100 MG tablet Take 100 mg by mouth daily.   . cycloSPORINE modified (NEORAL) 25 MG capsule Take 50 mg by mouth 2 (two) times daily.    . fentaNYL (DURAGESIC - DOSED MCG/HR) 25 MCG/HR patch Place 25 mcg onto the skin every 3 (three) days. Remove per schedule   . labetalol (NORMODYNE) 100 MG tablet Take 100 mg by mouth 2 (two) times daily.   . Lidocaine 4 % PTCH Apply to skin topically in the morning for pain, remove in the evening   . loperamide (IMODIUM) 2 MG capsule Take 2 mg by mouth as needed for diarrhea or loose stools. For loose stool, Up to 4 times daily x 10 days   . LORazepam (ATIVAN) 0.5 MG tablet Take 0.5 mg by mouth every 6 (six) hours  as needed for anxiety. X 10 days, ending on 07/30/18   . Melatonin 3 MG TABS Give 2 tablets by mouth at bedtime for insomnia   . oxyCODONE (OXYCONTIN) 10 mg 12 hr tablet Take 1 tablet (10 mg total) by mouth every 12 (twelve) hours.   . Oxycodone HCl 10 MG TABS Take 10 mg by mouth every 6 (six) hours as needed. X 5 days   . Pancrelipase, Lip-Prot-Amyl, 24000-76000 units CPEP Give 1 capsule by mouth with meals    No facility-administered encounter medications on file as of 07/24/2018.      SIGNIFICANT DIAGNOSTIC EXAMS  LABS REVIEWED TODAY:   06-28-18: wbc 1.7; hgb 7.6; hct 23.0; mcv 93.2; plt 170; glucose 80 bun 18; creat 3.18; k+ 4.2; na++ 136; ca 8.5; liver normal albumin 2.7   Review of Systems  Constitutional: Negative for malaise/fatigue.  Respiratory: Negative for cough and shortness of breath.   Cardiovascular: Negative for chest pain, palpitations and leg swelling.  Gastrointestinal: Negative for abdominal pain, constipation and heartburn.  Musculoskeletal: Positive for back pain. Negative for joint pain and myalgias.       Back pain is chronic and is being managed   Skin: Negative.   Neurological: Negative for dizziness.  Psychiatric/Behavioral: The patient is not nervous/anxious.    Physical Exam  Constitutional: She is oriented to person, place, and time. She appears well-developed and well-nourished. No distress.  Frail   Neck: No thyromegaly present.  Cardiovascular: Normal rate, regular rhythm, normal heart sounds and intact distal pulses.  Pulmonary/Chest: Effort normal and breath sounds normal. No respiratory distress.  Abdominal: Soft. Bowel sounds are normal. She exhibits no distension. There is no tenderness.  Peg tube present without signs of infection present.   Musculoskeletal: Normal range of motion. She exhibits no edema.  Lymphadenopathy:    She has no cervical adenopathy.  Neurological: She is alert and oriented to person, place, and time.  Skin: Skin is  warm and dry. She is not diaphoretic.  Right chest wall dialysis access   Psychiatric: She has a normal mood and affect.      ASSESSMENT/ PLAN:  TODAY:   1. Essential benign hypertension: is stable b/p 165/84: will continue labetalol 100 mg twice daily   2.  Type 2 diabetes mellitus with end-stage  renal disease: is stable will continue to monitor   3. Postmenopausal osteoporosis: is stable will continue fosamax 70 mg weekly   4. Chronic respiratory failure with hypoxia: is stable will continue albuterol 2 puffs every 6 hours as needed is on chronic 02  5.  End stage renal disease on dialysis due to type 2 diabetes mellitus: will continue hemodialysis three times weekly and will monitor  6. Anemia of renal disease: is stable hgb 7.6  7. Immunosuppression: is status post liver transplant: is stable is on long term cyclosporin 50 mg twice daily   8. Chronic gout due to renal impairment: is stable will continue allopurinol 100 mg daily   9. Pancreatic insufficiency: is stable will continue pancrelipase, lip-prot-amyl: is stable will continue 24,000-76,000 units three times daily   10.  Depression with anxiety: is stable will continue ativan 0.5 mg every 6 hours as needed through 07-30-18. Takes melatonin 6 mg nightly for sleep   11.  DDD lumbosacral/chronic pain syndrome: is stable will continue duragesic 25 mcg patch every 3 days, oyxcontin 10 mg twice daily lidoderm patch and oxycodone 10 mg every 6 hours as needed    Will setup I/R at [facility]: for peg tube removal Will get cbc cmp       MD is aware of resident's narcotic use and is in agreement with current plan of care. We will attempt to wean resident as apropriate   Ok Edwards NP [facility] [place] [facility]  Contact [phone] Monday through Friday 8am- 5pm  After hours call [phone]

## 2018-07-25 ENCOUNTER — Other Ambulatory Visit: Payer: Self-pay

## 2018-07-25 MED ORDER — LORAZEPAM 0.5 MG PO TABS: 0.5000 mg | ORAL_TABLET | Freq: Four times a day (QID) | ORAL | 0 refills | Status: DC | PRN

## 2018-07-25 NOTE — Telephone Encounter (Signed)
Rx faxed to [facility] (P) [phone], (F) [phone] 

## 2018-07-26 ENCOUNTER — Non-Acute Institutional Stay (SKILLED_NURSING_FACILITY): Payer: Medicaid Other | Admitting: Internal Medicine

## 2018-07-26 ENCOUNTER — Encounter: Payer: Self-pay | Admitting: Internal Medicine

## 2018-07-26 DIAGNOSIS — E1169 Type 2 diabetes mellitus with other specified complication: Secondary | ICD-10-CM

## 2018-07-26 DIAGNOSIS — D649 Anemia, unspecified: Secondary | ICD-10-CM

## 2018-07-26 DIAGNOSIS — Z944 Liver transplant status: Secondary | ICD-10-CM

## 2018-07-26 DIAGNOSIS — Z992 Dependence on renal dialysis: Secondary | ICD-10-CM

## 2018-07-26 DIAGNOSIS — R110 Nausea: Secondary | ICD-10-CM

## 2018-07-26 DIAGNOSIS — Z931 Gastrostomy status: Secondary | ICD-10-CM

## 2018-07-26 DIAGNOSIS — M545 Low back pain: Secondary | ICD-10-CM

## 2018-07-26 DIAGNOSIS — K2930 Chronic superficial gastritis without bleeding: Secondary | ICD-10-CM

## 2018-07-26 DIAGNOSIS — G8929 Other chronic pain: Secondary | ICD-10-CM

## 2018-07-26 NOTE — Progress Notes (Signed)
Patient ID: Tami [name], female   DOB: Feb 11, 1956, 62 y.o.   MRN: [id number]   Provider:  DR Arletha Grippe Location:  [facility] Room Number: [place] of Service:  SNF (31)  PCP: [facility] [place] Patient Care Team: [facility], [place] as PCP - General  Extended Emergency Contact Information Primary Emergency Contact: Tami [name],Tami [name]          [place], [place] Phone: [phone] Relation: Son Secondary Emergency Contact: Tami [name] [place] Phone: [phone] Relation: Daughter  Code Status: Full Code Goals of Care: Advanced Directive information Advanced Directives 07/26/2018  Does Patient Have a Medical Advance Directive? No  Would patient like information on creating a medical advance directive? No - Patient declined      Chief Complaint  Patient presents with  . New Admit To SNF    Admission    HPI: Patient is a 62 y.o. 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 She req'd 4 units PRBCs for gastric ulcer bleed. She was able to tolerate po food and Peg tube clamped. Hgb 7.6; Cr .3.18; albumin 2.7She presents to SNF for short term rehab.   Today she reports nausea and decreased appetite. She would like Peg tube removed. She gets HD MWF. No falls. No f/c. Tolerating tx.   HTN - BP stable on labetalol 100 mg twice daily   DM -  Diet controlled  Postmenopausal osteoporosis - stable on fosamax 70 mg weekly   Chronic respiratory failure with hypoxia - stable on albuterol 2 puffs every 6 hours as needed; req's chronic [place] O2   AKI - req's intermittent HD at this time MWF; Cr 3.18  Anemia of renal disease -  stable; Hgb 7.6  Immunosuppression s/p liver transplant -on long term cyclosporin 50 mg twice daily   Chronic gout due to renal impairment - stable on allopurinol 100 mg daily   Pancreatic insufficiency - stable on pancrelipase, lip-prot-amyl 24,000-76,000 units three times daily   Depression with anxiety - mood stable on ativan 0.5 mg every 6 hours as needed through 07-30-18; melatonin 6 mg nightly for sleep   DDD lumbosacral/chronic pain syndrome - pain stable on duragesic 25 mcg patch every 3 days; lidoderm patch; she is not getting oxycodone 10 mg every 6 hours as needed nor Oxy ER q12hrs   Past Medical History:  Diagnosis Date  . Arthritis   . Gout   . 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 Hypertension    Past Surgical History:  Procedure Laterality Date  . ABDOMINAL HYSTERECTOMY    . LIVER TRANSPLANT     1996  . TONSILLECTOMY      reports that she has never smoked. She has never used smokeless tobacco. She reports that she drank alcohol. She reports that she does not use drugs. Social History   Socioeconomic History  . Marital status: Single    Spouse name: Not on file  . Number of children: Not on file  . Years of education: Not on file  . Highest education level: Not on file  Occupational History  . Not on file  Social Needs  . Financial resource strain: Not on file  . Food insecurity:  Worry: Not on file    Inability: Not on file  . Transportation needs:    Medical: Not on file    Non-medical: Not on file  Tobacco Use  . Smoking status: Never Smoker  . Smokeless tobacco: Never Used  . Tobacco comment: never used tobacco  Substance and Sexual Activity  . Alcohol use: Not Currently    Alcohol/week: 0.0 standard drinks  . Drug use: No  . Sexual activity: Not on file  Lifestyle  . Physical activity:    Days per week: Not on file    Minutes per session: Not on file  . Stress: Not on file  Relationships  . Social connections:    Talks on phone: Not  on file    Gets together: Not on file    Attends religious service: Not on file    Active member of club or organization: Not on file    Attends meetings of clubs or organizations: Not on file    Relationship status: Not on file  . Intimate partner violence:    Fear of current or ex partner: Not on file    Emotionally abused: Not on file    Physically abused: Not on file    Forced sexual activity: Not on file  Other Topics Concern  . Not on file  Social History Narrative  . Not on file    Functional Status Survey:    Family History  Problem Relation Age of Onset  . Stroke Mother   . Cancer Father     Health Maintenance  Topic Date Due  . FOOT EXAM  07/25/2019 (Originally 03/16/1965)  . MAMMOGRAM  07/25/2019 (Originally 02/03/2018)  . PAP SMEAR  07/25/2019 (Originally 03/16/1976)  . HEMOGLOBIN A1C  07/25/2019 (Originally 05/15/1956)  . OPHTHALMOLOGY EXAM  07/25/2019 (Originally 03/16/1965)  . URINE MICROALBUMIN  07/25/2019 (Originally 03/16/1965)  . COLONOSCOPY  07/25/2019 (Originally 03/16/2005)  . PNEUMOCOCCAL POLYSACCHARIDE VACCINE AGE 36-64 HIGH RISK  07/25/2019 (Originally 03/16/1957)  . TETANUS/TDAP  07/25/2019 (Originally 03/16/1974)  . INFLUENZA VACCINE  11/05/2019 (Originally 05/24/2018)  . Hepatitis C Screening  Completed  . HIV Screening  Completed    Allergies  Allergen Reactions  . Iodinated Diagnostic Agents   . Ioxaglate   . Latex     Outpatient Encounter Medications as of 07/26/2018  Medication Sig  . albuterol (PROVENTIL HFA;VENTOLIN HFA) 108 (90 Base) MCG/ACT inhaler Inhale 2 puffs into the lungs every 6 (six) hours as needed.   Marland Kitchen alendronate (FOSAMAX) 70 MG tablet Take 70 mg by mouth once a week. Give on Monday's  . allopurinol (ZYLOPRIM) 100 MG tablet Take 100 mg by mouth daily.  . cycloSPORINE modified (NEORAL) 25 MG capsule Take 50 mg by mouth 2 (two) times daily.   . fentaNYL (DURAGESIC - DOSED MCG/HR) 25 MCG/HR patch Place 25 mcg onto the skin every 3  (three) days. Remove per schedule  . labetalol (NORMODYNE) 100 MG tablet Take 100 mg by mouth 2 (two) times daily.  . Lidocaine 4 % PTCH Apply to skin topically in the morning for pain, remove in the evening  . loperamide (IMODIUM) 2 MG capsule Take 2 mg by mouth as needed for diarrhea or loose stools. For loose stool, Up to 4 times daily x 10 days  . LORazepam (ATIVAN) 0.5 MG tablet Take 1 tablet (0.5 mg total) by mouth every 6 (six) hours as needed for anxiety.  . Melatonin 3 MG TABS Give 2 tablets by mouth at bedtime for insomnia  .  Nutritional Supplements (NUTRITIONAL SUPPLEMENT PO) Regular Diet - Mechanical Soft texture  . ondansetron (ZOFRAN) 4 MG tablet Take 4 mg by mouth every 6 (six) hours as needed for nausea or vomiting.  Marland Kitchen oxyCODONE (OXYCONTIN) 10 mg 12 hr tablet Take 1 tablet (10 mg total) by mouth every 12 (twelve) hours.  . Pancrelipase, Lip-Prot-Amyl, 24000-76000 units CPEP Give 1 capsule by mouth with meals   No facility-administered encounter medications on file as of 07/26/2018.     Review of Systems  Constitutional: Positive for appetite change.  Gastrointestinal: Positive for nausea.  All other systems reviewed and are negative.   Vitals:   07/26/18 1036  BP: 120/68  Pulse: 72  Resp: 18  Temp: 97.8 F (36.6 C)  SpO2: 98%  Weight: 148 lb 6.4 oz (67.3 kg)  Height: 5\' 2"  (1.575 m)   Body mass index is 27.14 kg/m. Physical Exam  Constitutional: She is oriented to person, place, and time. She appears well-developed and well-nourished.  Frail appearing in NAD, lying in bed  HENT:  Mouth/Throat: Oropharynx is clear and moist. No oropharyngeal exudate.  MMM; no oral thrush  Eyes: Pupils are equal, round, and reactive to light. No scleral icterus.  Neck: Neck supple. Carotid bruit is not present. No tracheal deviation present. No thyromegaly present.  Trach decannulated; dsg c/d/i  Cardiovascular: Normal rate, regular rhythm and intact distal pulses. Exam reveals  no gallop and no friction rub.  Murmur (1/6 SEM) heard. No LE edema b/l. no calf TTP.   Pulmonary/Chest: Effort normal and breath sounds normal. No stridor. No respiratory distress. She has no wheezes. She has no rales.  Right ACW TDC intact with no redness or d/c at insertion site  Abdominal: Soft. Normal appearance and bowel sounds are normal. She exhibits no distension and no mass. There is no hepatomegaly. There is no tenderness. There is no rigidity, no rebound and no guarding. No hernia.  Obese; Peg tube clamped - residual fluid noted in tubing  Lymphadenopathy:    She has no cervical adenopathy.  Neurological: She is alert and oriented to person, place, and time. She has normal reflexes.  Skin: Skin is warm and dry. No rash noted.  Psychiatric: She has a normal mood and affect. Her behavior is normal. Judgment and thought content normal.    Labs reviewed: Basic Metabolic Panel: Recent Labs    10/14/17 0035  10/16/17 0452 02/20/18 0918 04/11/18 1111  NA 136   < > 142 134* 146*  K 4.6   < > 4.1 5.0 5.0*  CL 109   < > 115* 106 107  CO2 19*   < > 20* 19* 26  GLUCOSE 94   < > 81 118* 161*  BUN 62*   < > 38* 29* 27*  CREATININE 5.32*   < > 3.29* 2.08* 2.30*  CALCIUM 8.0*   < > 7.8* 8.6* 9.0  MG 1.9  --   --   --   --   PHOS 6.7*  --  3.5  --   --    < > = values in this interval not displayed.   Liver Function Tests: Recent Labs    10/14/17 0551 10/16/17 0452 02/20/18 0918 04/11/18 1111  AST 19  --  19 23  ALT 10*  --  10* 15  ALKPHOS 54  --  79 84  BILITOT 0.6  --  0.7 0.7  PROT 6.7  --  7.8 8.4*  ALBUMIN 2.9* 2.8* 3.7 3.6  No results for input(s): LIPASE, AMYLASE in the last 8760 hours. No results for input(s): AMMONIA in the last 8760 hours. CBC: Recent Labs    10/13/17 1106 10/13/17 1559  10/16/17 0452 02/20/18 0918 04/11/18 1111  WBC 4.3 3.6*   < > 2.8* 3.7* 3.4*  NEUTROABS 2.7 2.4  --   --   --  2.4  HGB 9.0* 9.0*   < > 8.7* 9.8* 10.7*  HCT 27.5*  26.7*   < > 26.0* 28.7* 32.9*  MCV 94 91.4   < > 93.2 92.0 92.4  PLT 124* 105*   < > 104* 94* 140*   < > = values in this interval not displayed.   Cardiac Enzymes: No results for input(s): CKTOTAL, CKMB, CKMBINDEX, TROPONINI in the last 8760 hours. BNP: Invalid input(s): POCBNP No results found for: HGBA1C No results found for: TSH Lab Results  Component Value Date   AVWPVXYI01 655 11/19/2008   No results found for: FOLATE Lab Results  Component Value Date   IRON 90 10/13/2017   TIBC 174 (L) 10/13/2017   FERRITIN 710 (H) 10/13/2017    Imaging and Procedures obtained prior to SNF admission: Dg Chest 2 View  Result Date: 02/20/2018 CLINICAL DATA:  Cough and shortness of breath EXAM: CHEST - 2 VIEW COMPARISON:  October 13, 2017 FINDINGS: There is no edema or consolidation. The heart size and pulmonary vascularity are normal. No adenopathy. There is anterior wedging of several lower thoracic vertebral bodies, stable. IMPRESSION: No edema or consolidation. Electronically Signed   By: Lowella Grip III M.D.   On: 02/20/2018 09:19    Assessment/Plan   ICD-10-CM   1. Nausea R11.0   2. Type 2 diabetes mellitus with other specified complication, without long-term current use of insulin (HCC) E11.69   3. Dialysis patient ([place]) Z99.2   4. Liver transplant status ([place]) Z94.4   5. Anemia, unspecified type D64.9   6. PEG (percutaneous endoscopic gastrostomy) status ([place]) Z93.1   7. Chronic low back pain, unspecified back pain laterality, unspecified whether sciatica present M54.5    G89.29   8. Chronic superficial gastritis without bleeding K29.30    Cont current meds as ordered - pain controlled and no need to add Oxy IR or Oxy ER at this time  Cont PT/OT/ST as ordered  F/u with HD as scheduled  Peg tube care as indicated - please flush as indicated  F/u with specialists as scheduled  GOAL: short term rehab and d/c home when medically appropriate. Communicated with pt and  nursing.  Will follow  Labs/tests ordered: flat/upright abdominal xray    Melanni Benway S. Perlie Gold  [facility] [address] [place], [place] [address] [phone] Cell (Monday-Friday 8 AM - 5 PM) [phone] After 5 PM and follow prompts

## 2018-07-27 NOTE — Unmapped (Signed)
Patient in local rehab.  [facility] [address] [place], [place], [place] [address].  Phone# [phone].  Spoke with nurse manager confirming patient is taking Neoral 50 mg BID.  Was told to fax lab orders to [facility] Fax: [phone].      Patient on dialysis Mondays, Wednesdays, and Fridays.  Patient is brought to the [facility] [address], [place], [place] [address] Phone: [phone].  Patient has a line for now but local physician mentioned placing permanent line.      Faxed copy of lab order to local rehab center.

## 2018-07-30 ENCOUNTER — Other Ambulatory Visit: Payer: Self-pay

## 2018-07-30 MED ORDER — OXYCODONE HCL 10 MG PO TABS: 10.0000 mg | ORAL_TABLET | Freq: Three times a day (TID) | ORAL | 0 refills | Status: DC | PRN

## 2018-07-30 NOTE — Telephone Encounter (Signed)
Rx faxed to [facility] (P) [phone], (F) [phone] 

## 2018-07-31 ENCOUNTER — Non-Acute Institutional Stay (SKILLED_NURSING_FACILITY): Payer: Medicaid Other | Admitting: Adult Health

## 2018-07-31 ENCOUNTER — Encounter: Payer: Self-pay | Admitting: Adult Health

## 2018-07-31 ENCOUNTER — Other Ambulatory Visit: Payer: Self-pay

## 2018-07-31 DIAGNOSIS — E1122 Type 2 diabetes mellitus with diabetic chronic kidney disease: Secondary | ICD-10-CM

## 2018-07-31 DIAGNOSIS — M810 Age-related osteoporosis without current pathological fracture: Secondary | ICD-10-CM

## 2018-07-31 DIAGNOSIS — J9611 Chronic respiratory failure with hypoxia: Secondary | ICD-10-CM

## 2018-07-31 DIAGNOSIS — I10 Essential (primary) hypertension: Secondary | ICD-10-CM

## 2018-07-31 DIAGNOSIS — N186 End stage renal disease: Secondary | ICD-10-CM

## 2018-07-31 MED ORDER — ZOLPIDEM TARTRATE 5 MG PO TABS: 5.0000 mg | ORAL_TABLET | Freq: Every day | ORAL | 0 refills | Status: DC

## 2018-07-31 NOTE — Telephone Encounter (Signed)
Rx faxed to [facility] (P) [phone], (F) [phone] 

## 2018-07-31 NOTE — Progress Notes (Signed)
Location:   [facility] Room Number: [place] of Service:  SNF (31)   CODE STATUS: Full Code  Allergies  Allergen Reactions  . Iodinated Diagnostic Agents   . Ioxaglate   . Latex     Chief Complaint  Patient presents with  . Medical Management of Chronic Issues    Hypertension; diabetes; chronic respiratory failure; osteoporosis. Weekly follow up for the first 30 days post hospitalization.     HPI:  She is a 62 year old long term resident of this facility being seen for the management of her chronic illnesses: hypertension; diabetes; chronic respiratory failure; osteoporosis. She denies any shortness of breath; no cough; no uncontrolled back pain; no changes in appetite.   Past Medical History:  Diagnosis Date  . Arthritis   . Gout   . 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 Hypertension     Past Surgical History:  Procedure Laterality Date  . ABDOMINAL HYSTERECTOMY    . LIVER TRANSPLANT     1996  . TONSILLECTOMY      Social History   Socioeconomic History  . Marital status: Single    Spouse name: Not on file  . Number of children: Not on file  . Years of education: Not on file  . Highest education level: Not on file  Occupational History  . Not on file  Social Needs  . Financial resource strain: Not on file  . Food insecurity:    Worry: Not on file    Inability: Not on file  . Transportation needs:    Medical: Not on file    Non-medical: Not on file  Tobacco Use  . Smoking status: Never Smoker  . Smokeless tobacco: Never Used  . Tobacco comment: never used tobacco  Substance and Sexual Activity  . Alcohol use: Not Currently    Alcohol/week: 0.0 standard drinks  . Drug use: No  . Sexual activity: Not on file  Lifestyle  . Physical activity:    Days per week: Not on file    Minutes per session: Not on file  . Stress: Not on file  Relationships  . Social connections:    Talks on phone: Not on file    Gets together: Not on  file    Attends religious service: Not on file    Active member of club or organization: Not on file    Attends meetings of clubs or organizations: Not on file    Relationship status: Not on file  . Intimate partner violence:    Fear of current or ex partner: Not on file    Emotionally abused: Not on file    Physically abused: Not on file    Forced sexual activity: Not on file  Other Topics Concern  . Not on file  Social History Narrative  . Not on file   Family History  Problem Relation Age of Onset  . Stroke Mother   . Cancer Father       VITAL SIGNS BP 120/68   Pulse 72   Temp 97.8 F (36.6 C)   Resp 18   Ht 5\' 2"  (1.575 m)   Wt 148 lb 6.4 oz (67.3 kg)   SpO2 98%   BMI 27.14 kg/m   Outpatient Encounter Medications as of 07/31/2018  Medication Sig  . albuterol (PROVENTIL HFA;VENTOLIN HFA) 108 (90 Base) MCG/ACT inhaler Inhale 2 puffs into the lungs every 6 (six) hours as needed.   Marland Kitchen alendronate (FOSAMAX) 70 MG  tablet Take 70 mg by mouth once a week. Give on Monday's  . allopurinol (ZYLOPRIM) 100 MG tablet Take 100 mg by mouth daily.  . cycloSPORINE modified (NEORAL) 25 MG capsule Take 50 mg by mouth 2 (two) times daily.   Marland Kitchen ENSURE (ENSURE) Give 120 cc by mouth two times daily  . fentaNYL (DURAGESIC - DOSED MCG/HR) 25 MCG/HR patch Place 25 mcg onto the skin every 3 (three) days. Remove per schedule  . labetalol (NORMODYNE) 100 MG tablet Take 100 mg by mouth 2 (two) times daily.  . Lidocaine 4 % PTCH Apply to skin topically in the morning for pain, remove in the evening  . Melatonin 3 MG TABS Give 2 tablets by mouth at bedtime for insomnia  . Nutritional Supplements (NUTRITIONAL SUPPLEMENT PO) Regular Diet - Mechanical Soft texture  . ondansetron (ZOFRAN) 4 MG tablet Take 4 mg by mouth every 6 (six) hours as needed for nausea or vomiting.  . Oxycodone HCl 10 MG TABS Take 1 tablet (10 mg total) by mouth every 8 (eight) hours as needed.  . Pancrelipase, Lip-Prot-Amyl,  24000-76000 units CPEP Give 1 capsule by mouth with meals  . [DISCONTINUED] LORazepam (ATIVAN) 0.5 MG tablet Take 1 tablet (0.5 mg total) by mouth every 6 (six) hours as needed for anxiety. (Patient not taking: Reported on 07/31/2018)   No facility-administered encounter medications on file as of 07/31/2018.      SIGNIFICANT DIAGNOSTIC EXAMS   LABS REVIEWED TODAY:   06-28-18: wbc 1.7; hgb 7.6; hct 23.0; mcv 93.2; plt 170; glucose 80 bun 18; creat 3.18; k+ 4.2; na++ 136; ca 8.5; liver normal albumin 2.7   NO NEW LABS.    Review of Systems  Constitutional: Negative for malaise/fatigue.  Respiratory: Negative for cough and shortness of breath.   Cardiovascular: Negative for chest pain, palpitations and leg swelling.  Gastrointestinal: Negative for abdominal pain, constipation and heartburn.  Musculoskeletal: Positive for back pain. Negative for joint pain and myalgias.       Back pain is chronic and managed   Skin: Negative.   Neurological: Negative for dizziness.  Psychiatric/Behavioral: The patient is not nervous/anxious.     Physical Exam  Constitutional: She is oriented to person, place, and time. She appears well-developed and well-nourished. No distress.  Frail   Neck: No thyromegaly present.  Cardiovascular: Normal rate, regular rhythm, normal heart sounds and intact distal pulses.  Pulmonary/Chest: Effort normal and breath sounds normal. No respiratory distress.  Abdominal: Soft. Bowel sounds are normal. She exhibits no distension. There is no tenderness.  Peg tube present without signs of infection present   Musculoskeletal: Normal range of motion. She exhibits no edema.  Lymphadenopathy:    She has no cervical adenopathy.  Neurological: She is alert and oriented to person, place, and time.  Skin: Skin is warm and dry. She is not diaphoretic.  Right chest wall dialysis access   Psychiatric: She has a normal mood and affect.    ASSESSMENT/ PLAN:  TODAY:   1.  Essential benign hypertension: is stable b/p 120/68: will continue labetalol 100 mg twice daily   2.  Type 2 diabetes mellitus with end-stage renal disease: is stable will continue to monitor   3. Postmenopausal osteoporosis: is stable will continue fosamax 70 mg weekly   4. Chronic respiratory failure with hypoxia: is stable will continue albuterol 2 puffs every 6 hours as needed is on chronic 02  PREVIOUS   5.  End stage renal disease on dialysis due  to type 2 diabetes mellitus: will continue hemodialysis three times weekly and will monitor  6. Anemia of renal disease: is stable hgb 7.6  7. Immunosuppression: is status post liver transplant: is stable is on long term cyclosporin 50 mg twice daily   8. Chronic gout due to renal impairment: is stable will continue allopurinol 100 mg daily   9. Pancreatic insufficiency: is stable will continue pancrelipase, lip-prot-amyl: is stable will continue 24,000-76,000 units three times daily   10.  Depression with anxiety: is stable will continue ativan 0.5 mg every 6 hours as needed through 07-30-18. Takes melatonin 6 mg nightly for sleep   11.  DDD lumbosacral/chronic pain syndrome: is stable will continue duragesic 25 mcg patch every 3 days, oyxcontin 10 mg twice daily lidoderm patch and oxycodone 10 mg every 6 hours as needed      MD is aware of resident's narcotic use and is in agreement with current plan of care. We will attempt to wean resident as apropriate   Ok Edwards NP [facility]  Contact [phone] Monday through Friday 8am- 5pm  After hours call [phone]

## 2018-08-02 ENCOUNTER — Other Ambulatory Visit: Payer: Self-pay | Admitting: Internal Medicine

## 2018-08-02 DIAGNOSIS — M545 Low back pain, unspecified: Secondary | ICD-10-CM

## 2018-08-02 DIAGNOSIS — M5417 Radiculopathy, lumbosacral region: Secondary | ICD-10-CM

## 2018-08-02 DIAGNOSIS — M5137 Other intervertebral disc degeneration, lumbosacral region: Secondary | ICD-10-CM

## 2018-08-02 DIAGNOSIS — M5414 Radiculopathy, thoracic region: Secondary | ICD-10-CM

## 2018-08-02 DIAGNOSIS — G8929 Other chronic pain: Secondary | ICD-10-CM

## 2018-08-03 ENCOUNTER — Other Ambulatory Visit: Payer: Self-pay

## 2018-08-03 DIAGNOSIS — G8929 Other chronic pain: Secondary | ICD-10-CM

## 2018-08-03 DIAGNOSIS — M5137 Other intervertebral disc degeneration, lumbosacral region: Secondary | ICD-10-CM

## 2018-08-03 DIAGNOSIS — M5414 Radiculopathy, thoracic region: Secondary | ICD-10-CM

## 2018-08-03 DIAGNOSIS — M5417 Radiculopathy, lumbosacral region: Secondary | ICD-10-CM

## 2018-08-03 DIAGNOSIS — M545 Low back pain: Principal | ICD-10-CM

## 2018-08-03 MED ORDER — OXYCODONE HCL 10 MG PO TABS: 10.0000 mg | ORAL_TABLET | Freq: Three times a day (TID) | ORAL | 0 refills | Status: DC | PRN

## 2018-08-03 MED ORDER — OXYCODONE HCL ER 10 MG PO T12A: 10.0000 mg | EXTENDED_RELEASE_TABLET | Freq: Two times a day (BID) | ORAL | 0 refills | Status: DC

## 2018-08-03 MED ORDER — FENTANYL 25 MCG/HR TD PT72: 25.0000 ug | MEDICATED_PATCH | TRANSDERMAL | 0 refills | Status: DC

## 2018-08-03 NOTE — Unmapped (Signed)
Sent fax requesting lab records.

## 2018-08-03 NOTE — Telephone Encounter (Signed)
Rx faxed to [facility] (P) [phone], (F) [phone] 

## 2018-08-04 DIAGNOSIS — D899 Disorder involving the immune mechanism, unspecified: Secondary | ICD-10-CM

## 2018-08-04 DIAGNOSIS — N186 End stage renal disease: Secondary | ICD-10-CM

## 2018-08-04 DIAGNOSIS — M1A30X0 Chronic gout due to renal impairment, unspecified site, without tophus (tophi): Secondary | ICD-10-CM | POA: Insufficient documentation

## 2018-08-04 DIAGNOSIS — I10 Essential (primary) hypertension: Secondary | ICD-10-CM | POA: Insufficient documentation

## 2018-08-04 DIAGNOSIS — M810 Age-related osteoporosis without current pathological fracture: Secondary | ICD-10-CM | POA: Insufficient documentation

## 2018-08-04 DIAGNOSIS — K8689 Other specified diseases of pancreas: Secondary | ICD-10-CM | POA: Insufficient documentation

## 2018-08-04 DIAGNOSIS — J9610 Chronic respiratory failure, unspecified whether with hypoxia or hypercapnia: Secondary | ICD-10-CM | POA: Insufficient documentation

## 2018-08-04 DIAGNOSIS — D849 Immunodeficiency, unspecified: Secondary | ICD-10-CM | POA: Insufficient documentation

## 2018-08-04 DIAGNOSIS — Z992 Dependence on renal dialysis: Secondary | ICD-10-CM

## 2018-08-04 DIAGNOSIS — E1122 Type 2 diabetes mellitus with diabetic chronic kidney disease: Secondary | ICD-10-CM | POA: Insufficient documentation

## 2018-08-07 ENCOUNTER — Encounter: Payer: Self-pay | Admitting: Adult Health

## 2018-08-07 ENCOUNTER — Non-Acute Institutional Stay (SKILLED_NURSING_FACILITY): Payer: Medicaid Other | Admitting: Adult Health

## 2018-08-07 DIAGNOSIS — D899 Disorder involving the immune mechanism, unspecified: Secondary | ICD-10-CM | POA: Diagnosis not present

## 2018-08-07 DIAGNOSIS — N189 Chronic kidney disease, unspecified: Secondary | ICD-10-CM

## 2018-08-07 DIAGNOSIS — D631 Anemia in chronic kidney disease: Secondary | ICD-10-CM

## 2018-08-07 DIAGNOSIS — M1A30X0 Chronic gout due to renal impairment, unspecified site, without tophus (tophi): Secondary | ICD-10-CM | POA: Diagnosis not present

## 2018-08-07 DIAGNOSIS — D849 Immunodeficiency, unspecified: Secondary | ICD-10-CM

## 2018-08-07 DIAGNOSIS — N186 End stage renal disease: Secondary | ICD-10-CM

## 2018-08-07 DIAGNOSIS — E1122 Type 2 diabetes mellitus with diabetic chronic kidney disease: Secondary | ICD-10-CM

## 2018-08-07 DIAGNOSIS — Z992 Dependence on renal dialysis: Secondary | ICD-10-CM

## 2018-08-07 NOTE — Progress Notes (Signed)
Location:   [facility] Room Number: [place] of Service:  SNF (31)   CODE STATUS: Full Code  Allergies  Allergen Reactions  . Iodinated Diagnostic Agents   . Ioxaglate   . Latex     Chief Complaint  Patient presents with  . Medical Management of Chronic Issues    Gout; anemia; immunosuppression; esrd. Weekly follow up for the first 30 days post hospitalization     HPI:  She is a 62 year old short term rehab patient of this facility being seen for the management of her chronic illnesses: gout; anemia; immunosuppression; esrd. She denies any joint pain or uncontrolled back pain; no changes in appetite; no fevers.   Past Medical History:  Diagnosis Date  . Arthritis   . Gout   . Hepatitis, autoimmune ([place]) 12/08/2011  . Hypertension     Past Surgical History:  Procedure Laterality Date  . ABDOMINAL HYSTERECTOMY    . LIVER TRANSPLANT     1996  . TONSILLECTOMY      Social History   Socioeconomic History  . Marital status: Single    Spouse name: Not on file  . Number of children: Not on file  . Years of education: Not on file  . Highest education level: Not on file  Occupational History  . Not on file  Social Needs  . Financial resource strain: Not on file  . Food insecurity:    Worry: Not on file    Inability: Not on file  . Transportation needs:    Medical: Not on file    Non-medical: Not on file  Tobacco Use  . Smoking status: Never Smoker  . Smokeless tobacco: Never Used  . Tobacco comment: never used tobacco  Substance and Sexual Activity  . Alcohol use: Not Currently    Alcohol/week: 0.0 standard drinks  . Drug use: No  . Sexual activity: Not on file  Lifestyle  . Physical activity:    Days per week: Not on file    Minutes per session: Not on file  . Stress: Not on file  Relationships  . Social connections:    Talks on phone: Not on file    Gets together: Not on file    Attends religious service: Not on file    Active  member of club or organization: Not on file    Attends meetings of clubs or organizations: Not on file    Relationship status: Not on file  . Intimate partner violence:    Fear of current or ex partner: Not on file    Emotionally abused: Not on file    Physically abused: Not on file    Forced sexual activity: Not on file  Other Topics Concern  . Not on file  Social History Narrative  . Not on file   Family History  Problem Relation Age of Onset  . Stroke Mother   . Cancer Father       VITAL SIGNS BP (!) 150/96   Pulse 80   Temp 98.6 F (37 C)   Resp 20   Ht 5\' 2"  (1.575 m)   Wt 148 lb 6.4 oz (67.3 kg)   SpO2 95%   BMI 27.14 kg/m   Outpatient Encounter Medications as of 08/07/2018  Medication Sig  . albuterol (PROVENTIL HFA;VENTOLIN HFA) 108 (90 Base) MCG/ACT inhaler Inhale 2 puffs into the lungs every 6 (six) hours as needed.   Marland Kitchen alendronate (FOSAMAX) 70 MG tablet Take 70  mg by mouth once a week. Give on Monday's  . allopurinol (ZYLOPRIM) 100 MG tablet Take 100 mg by mouth daily.  . cycloSPORINE modified (NEORAL) 25 MG capsule Take 50 mg by mouth 2 (two) times daily.   Marland Kitchen ENSURE (ENSURE) Give 120 cc by mouth two times daily  . fentaNYL (DURAGESIC - DOSED MCG/HR) 25 MCG/HR patch Place 1 patch (25 mcg total) onto the skin every 3 (three) days. Remove per schedule; hold for sedation  . labetalol (NORMODYNE) 100 MG tablet Take 100 mg by mouth 2 (two) times daily.  . Lidocaine 4 % PTCH Apply to skin topically in the morning for pain, remove in the evening  . Melatonin 3 MG TABS Give 2 tablets by mouth at bedtime for insomnia  . Nutritional Supplements (NUTRITIONAL SUPPLEMENT PO) Regular Diet - Mechanical Soft texture  . ondansetron (ZOFRAN) 4 MG tablet Take 4 mg by mouth every 6 (six) hours as needed for nausea or vomiting.  . Oxycodone HCl 10 MG TABS Take 1 tablet (10 mg total) by mouth every 8 (eight) hours as needed (severe pain). - hold for sedation  . Pancrelipase,  Lip-Prot-Amyl, 24000-76000 units CPEP Give 1 capsule by mouth with meals  . pantoprazole (PROTONIX) 40 MG tablet Take 40 mg by mouth 2 (two) times daily.  Marland Kitchen zolpidem (AMBIEN) 5 MG tablet Take 1 tablet (5 mg total) by mouth at bedtime.  . [DISCONTINUED] oxyCODONE (OXYCONTIN) 10 mg 12 hr tablet Take 1 tablet (10 mg total) by mouth 2 (two) times daily. For chronic pain - hold for sedation (Patient not taking: Reported on 08/07/2018)   No facility-administered encounter medications on file as of 08/07/2018.      SIGNIFICANT DIAGNOSTIC EXAMS  LABS REVIEWED PREVIOUS:   06-28-18: wbc 1.7; hgb 7.6; hct 23.0; mcv 93.2; plt 170; glucose 80 bun 18; creat 3.18; k+ 4.2; na++ 136; ca 8.5; liver normal albumin 2.7   NO NEW LABS.    Review of Systems  Constitutional: Negative for malaise/fatigue.  Respiratory: Negative for cough and shortness of breath.   Cardiovascular: Negative for chest pain, palpitations and leg swelling.  Gastrointestinal: Negative for abdominal pain, constipation and heartburn.  Musculoskeletal: Negative for back pain, joint pain and myalgias.  Skin: Negative.   Neurological: Negative for dizziness.  Psychiatric/Behavioral: The patient is not nervous/anxious.    Physical Exam  Constitutional: She is oriented to person, place, and time. She appears well-developed and well-nourished. No distress.  Neck: No thyromegaly present.  Cardiovascular: Normal rate, regular rhythm, normal heart sounds and intact distal pulses.  Pulmonary/Chest: Effort normal and breath sounds normal. No respiratory distress.  Abdominal: Soft. Bowel sounds are normal. She exhibits no distension. There is no tenderness.  Peg tube present without signs of infection present  Musculoskeletal: Normal range of motion. She exhibits no edema.  Lymphadenopathy:    She has no cervical adenopathy.  Neurological: She is alert and oriented to person, place, and time.  Skin: Skin is warm and dry. She is not  diaphoretic.  Right chest wall dialysis access   Psychiatric: She has a normal mood and affect.     ASSESSMENT/ PLAN:  TODAY:   1.  End stage renal disease on dialysis due to type 2 diabetes mellitus: will continue hemodialysis three times weekly and will monitor  2. Anemia of renal disease: is stable hgb 7.6  3. Immunosuppression: is status post liver transplant: is stable is on long term cyclosporin 50 mg twice daily   4. Chronic  gout due to renal impairment: is stable will continue allopurinol 100 mg daily   PREVIOUS   5. Pancreatic insufficiency: is stable will continue pancrelipase, lip-prot-amyl: is stable will continue 24,000-76,000 units three times daily   6.  Depression with anxiety: is stable is currently off medications. Takes melatonin 6 mg nightly for sleep   7.  DDD lumbosacral/chronic pain syndrome: is stable will continue duragesic 25 mcg patch every 3 days, lidoderm patch and oxycodone 10 mg every 8 hours as needed  8. Essential benign hypertension: is stable  will continue labetalol 100 mg twice daily   9.  Type 2 diabetes mellitus with end-stage renal disease: is stable will continue to monitor   10. Postmenopausal osteoporosis: is stable will continue fosamax 70 mg weekly   11. Chronic respiratory failure with hypoxia: is stable will continue albuterol 2 puffs every 6 hours as needed is on chronic 02  Removal of peg tube is pending.    MD is aware of resident's narcotic use and is in agreement with current plan of care. We will attempt to wean resident as apropriate   Ok Edwards NP [facility]  Contact [phone] Monday through Friday 8am- 5pm  After hours call [phone]

## 2018-08-10 ENCOUNTER — Non-Acute Institutional Stay (SKILLED_NURSING_FACILITY): Payer: Medicaid Other | Admitting: Adult Health

## 2018-08-10 DIAGNOSIS — J3089 Other allergic rhinitis: Secondary | ICD-10-CM

## 2018-08-10 LAB — BASIC METABOLIC PANEL
BUN: 1 — AB (ref 4–21)
Creatinine: 0.7 (ref 0.5–1.1)
GLUCOSE: 92
POTASSIUM: 2.6 — AB (ref 3.4–5.3)
Sodium: 137 (ref 137–147)

## 2018-08-10 LAB — CBC AND DIFFERENTIAL
HEMATOCRIT: 25 — AB (ref 36–46)
Hemoglobin: 8.2 — AB (ref 12.0–16.0)
Neutrophils Absolute: 2
Platelets: 112 — AB (ref 150–399)
WBC: 2.7

## 2018-08-10 LAB — HEPATIC FUNCTION PANEL
ALT: 7 (ref 7–35)
AST: 18 (ref 13–35)
Alkaline Phosphatase: 83 (ref 25–125)
BILIRUBIN, TOTAL: 0.3
Bilirubin, Direct: 0.2 (ref 0.01–0.4)

## 2018-08-11 DIAGNOSIS — Z992 Dependence on renal dialysis: Secondary | ICD-10-CM | POA: Insufficient documentation

## 2018-08-11 DIAGNOSIS — K2930 Chronic superficial gastritis without bleeding: Secondary | ICD-10-CM | POA: Insufficient documentation

## 2018-08-11 DIAGNOSIS — Z931 Gastrostomy status: Secondary | ICD-10-CM | POA: Insufficient documentation

## 2018-08-11 MED ORDER — LORATADINE 10 MG TABLET
Freq: Every day | ORAL | 0.00000 days | PRN
Start: 2018-08-11 — End: ?

## 2018-08-13 NOTE — Unmapped (Signed)
Called [place] at [facility] at  Phone: [phone] to request all labs for month of October to be faxed to [facility] liver transplant at fax# [phone].

## 2018-08-14 ENCOUNTER — Encounter: Payer: Self-pay | Admitting: Adult Health

## 2018-08-14 ENCOUNTER — Non-Acute Institutional Stay (SKILLED_NURSING_FACILITY): Payer: Medicaid Other | Admitting: Adult Health

## 2018-08-14 DIAGNOSIS — I10 Essential (primary) hypertension: Secondary | ICD-10-CM | POA: Diagnosis not present

## 2018-08-14 DIAGNOSIS — F418 Other specified anxiety disorders: Secondary | ICD-10-CM | POA: Diagnosis not present

## 2018-08-14 DIAGNOSIS — K8689 Other specified diseases of pancreas: Secondary | ICD-10-CM | POA: Diagnosis not present

## 2018-08-14 DIAGNOSIS — M5137 Other intervertebral disc degeneration, lumbosacral region: Secondary | ICD-10-CM

## 2018-08-14 LAB — CBC W/ DIFFERENTIAL
BASOPHILS ABSOLUTE COUNT: 0 10*9/L
EOSINOPHILS ABSOLUTE COUNT: 0 10*9/L
HEMATOCRIT: 24.6 % — AB
HEMOGLOBIN: 8.2 g/dL — AB
LYMPHOCYTES ABSOLUTE COUNT: 0.6 10*9/L
MONOCYTES ABSOLUTE COUNT: 0.6 10*9/L
NEUTROPHILS ABSOLUTE COUNT: 1.7 10*9/L — AB
PLATELET COUNT: 112 10*9/L — AB

## 2018-08-14 LAB — BILIRUBIN DIRECT: Lab: 0

## 2018-08-14 LAB — COMPREHENSIVE METABOLIC PANEL
ALKALINE PHOSPHATASE: 83 U/L
ALT (SGPT): 7 U/L
AST (SGOT): 18 U/L
BILIRUBIN TOTAL: 0.3 mg/dL
BLOOD UREA NITROGEN: 104 mg/dL — AB
CALCIUM: 7.1 mg/dL — AB
CHLORIDE: 94 mmol/L — AB
CO2: 30 mmol/L
CREATININE: 0.67 mg/dL
GLUCOSE RANDOM: 92 mg/dL
POTASSIUM: 2.6 mmol/L — AB

## 2018-08-14 LAB — ALBUMIN: Lab: 3.3 — AB

## 2018-08-14 LAB — RED BLOOD CELL COUNT: Lab: 0

## 2018-08-14 LAB — EGFR MDRD AF AMER: Lab: 0

## 2018-08-14 NOTE — Progress Notes (Signed)
Location:   [facility] Room Number: [place] of Service:  SNF (31)   CODE STATUS: Full Code  Allergies  Allergen Reactions  . Iodinated Diagnostic Agents   . Ioxaglate   . Latex     Chief Complaint  Patient presents with  . Acute Visit    cough    HPI:  She has had a cough for the past several days; she has sinus congestion and post night drip. She denies any sore throat and no reports of fevers present.   Past Medical History:  Diagnosis Date  . Arthritis   . Gout   . Hepatitis, autoimmune ([place]) 12/08/2011  . Hypertension     Past Surgical History:  Procedure Laterality Date  . ABDOMINAL HYSTERECTOMY    . LIVER TRANSPLANT     1996  . TONSILLECTOMY      Social History   Socioeconomic History  . Marital status: Single    Spouse name: Not on file  . Number of children: Not on file  . Years of education: Not on file  . Highest education level: Not on file  Occupational History  . Not on file  Social Needs  . Financial resource strain: Not on file  . Food insecurity:    Worry: Not on file    Inability: Not on file  . Transportation needs:    Medical: Not on file    Non-medical: Not on file  Tobacco Use  . Smoking status: Never Smoker  . Smokeless tobacco: Never Used  . Tobacco comment: never used tobacco  Substance and Sexual Activity  . Alcohol use: Not Currently    Alcohol/week: 0.0 standard drinks  . Drug use: No  . Sexual activity: Not on file  Lifestyle  . Physical activity:    Days per week: Not on file    Minutes per session: Not on file  . Stress: Not on file  Relationships  . Social connections:    Talks on phone: Not on file    Gets together: Not on file    Attends religious service: Not on file    Active member of club or organization: Not on file    Attends meetings of clubs or organizations: Not on file    Relationship status: Not on file  . Intimate partner violence:    Fear of current or ex partner: Not  on file    Emotionally abused: Not on file    Physically abused: Not on file    Forced sexual activity: Not on file  Other Topics Concern  . Not on file  Social History Narrative  . Not on file   Family History  Problem Relation Age of Onset  . Stroke Mother   . Cancer Father       VITAL SIGNS BP (!) 108/93   Pulse 98   Temp (!) 97.2 F (36.2 C)   Resp 20   Ht 5\' 2"  (1.575 m)   Wt 145 lb (65.8 kg)   SpO2 100%   BMI 26.52 kg/m   Outpatient Encounter Medications as of 08/10/2018  Medication Sig  . albuterol (PROVENTIL HFA;VENTOLIN HFA) 108 (90 Base) MCG/ACT inhaler Inhale 2 puffs into the lungs every 6 (six) hours as needed.   Marland Kitchen alendronate (FOSAMAX) 70 MG tablet Take 70 mg by mouth once a week. Give on Monday's  . allopurinol (ZYLOPRIM) 100 MG tablet Take 100 mg by mouth daily.  . cycloSPORINE modified (NEORAL) 25 MG capsule  Take 50 mg by mouth 2 (two) times daily.   Marland Kitchen ENSURE (ENSURE) Give 120 cc by mouth two times daily  . fentaNYL (DURAGESIC - DOSED MCG/HR) 25 MCG/HR patch Place 1 patch (25 mcg total) onto the skin every 3 (three) days. Remove per schedule; hold for sedation  . Ferric Citrate (AURYXIA PO) Give 2 tablets by mouth before meals for supplement  . labetalol (NORMODYNE) 100 MG tablet Take 100 mg by mouth 2 (two) times daily.  . Lidocaine 4 % PTCH Apply to skin topically in the morning for pain, remove in the evening  . Melatonin 3 MG TABS Give 2 tablets by mouth at bedtime for insomnia  . Nutritional Supplements (NUTRITIONAL SUPPLEMENT PO) Regular Diet - Regular Texture  . ondansetron (ZOFRAN) 4 MG tablet Take 4 mg by mouth every 6 (six) hours as needed for nausea or vomiting.  . Pancrelipase, Lip-Prot-Amyl, 24000-76000 units CPEP Give 1 capsule by mouth with meals  . pantoprazole (PROTONIX) 40 MG tablet Take 40 mg by mouth 2 (two) times daily.  Marland Kitchen zolpidem (AMBIEN) 5 MG tablet Take 1 tablet (5 mg total) by mouth at bedtime.  . [DISCONTINUED] Oxycodone HCl 10  MG TABS Take 1 tablet (10 mg total) by mouth every 8 (eight) hours as needed (severe pain). - hold for sedation   No facility-administered encounter medications on file as of 08/10/2018.      SIGNIFICANT DIAGNOSTIC EXAMS  LABS REVIEWED PREVIOUS:   06-28-18: wbc 1.7; hgb 7.6; hct 23.0; mcv 93.2; plt 170; glucose 80 bun 18; creat 3.18; k+ 4.2; na++ 136; ca 8.5; liver normal albumin 2.7   NO NEW LABS.   Review of Systems  Constitutional: Negative for fever and malaise/fatigue.  HENT: Positive for congestion. Negative for sore throat.   Respiratory: Positive for cough. Negative for sputum production and shortness of breath.   Cardiovascular: Negative for chest pain, palpitations and leg swelling.  Gastrointestinal: Negative for abdominal pain, constipation and heartburn.  Musculoskeletal: Negative for back pain, joint pain and myalgias.  Skin: Negative.   Neurological: Negative for dizziness.  Psychiatric/Behavioral: The patient is not nervous/anxious.     Physical Exam  Constitutional: She is oriented to person, place, and time. She appears well-developed and well-nourished. No distress.  HENT:  Nose: Nose normal.  Mouth/Throat: Oropharynx is clear and moist.  Neck: No thyromegaly present.  Cardiovascular: Normal rate, regular rhythm, normal heart sounds and intact distal pulses.  Pulmonary/Chest: Effort normal and breath sounds normal. No respiratory distress.  Abdominal: Soft. Bowel sounds are normal. She exhibits no distension. There is no tenderness.  Peg tube present without signs of infection present.   Musculoskeletal: Normal range of motion. She exhibits no edema.  Lymphadenopathy:    She has no cervical adenopathy.  Neurological: She is alert and oriented to person, place, and time.  Skin: Skin is warm and dry. She is not diaphoretic.  Right chest wall dialysis access   Psychiatric: She has a normal mood and affect.     ASSESSMENT/ PLAN:  TODAY:   1.  Allergic  rhinitis: is worse: will begin claritin 1 mg daily and mucinex 600 mg twice daily for 10 days.   MD is aware of resident's narcotic use and is in agreement with current plan of care. We will attempt to wean resident as apropriate   Ok Edwards NP [facility]  Contact [phone] Monday through Friday 8am- 5pm  After hours call [phone]

## 2018-08-14 NOTE — Progress Notes (Signed)
Location:   [facility] Room Number: [place] of Service:  SNF (31)   CODE STATUS: Full Code  Allergies  Allergen Reactions  . Iodinated Diagnostic Agents   . Ioxaglate   . Latex     Chief Complaint  Patient presents with  . Medical Management of Chronic Issues    Pancreatic insufficiency; essential hypertensin benign DDD (degenerative disc disease) lumbosacral. Depression with anxiety. Weekly follow up for the first 30 days post hospitalization.     HPI:  She is 62 year old short term rehab patient being seen for the management of her chronic illnesses: pancreatic insufficiency; hypertension; DDD: depression with gerd. She states that she is feeling denies any sinus congestion or cough at this time. She denies any uncontrolled pain; no insomnia.    Past Medical History:  Diagnosis Date  . Arthritis   . Gout   . Hepatitis, autoimmune ([place]) 12/08/2011  . Hypertension     Past Surgical History:  Procedure Laterality Date  . ABDOMINAL HYSTERECTOMY    . LIVER TRANSPLANT     1996  . TONSILLECTOMY      Social History   Socioeconomic History  . Marital status: Single    Spouse name: Not on file  . Number of children: Not on file  . Years of education: Not on file  . Highest education level: Not on file  Occupational History  . Not on file  Social Needs  . Financial resource strain: Not on file  . Food insecurity:    Worry: Not on file    Inability: Not on file  . Transportation needs:    Medical: Not on file    Non-medical: Not on file  Tobacco Use  . Smoking status: Never Smoker  . Smokeless tobacco: Never Used  . Tobacco comment: never used tobacco  Substance and Sexual Activity  . Alcohol use: Not Currently    Alcohol/week: 0.0 standard drinks  . Drug use: No  . Sexual activity: Not on file  Lifestyle  . Physical activity:    Days per week: Not on file    Minutes per session: Not on file  . Stress: Not on file  Relationships  .  Social connections:    Talks on phone: Not on file    Gets together: Not on file    Attends religious service: Not on file    Active member of club or organization: Not on file    Attends meetings of clubs or organizations: Not on file    Relationship status: Not on file  . Intimate partner violence:    Fear of current or ex partner: Not on file    Emotionally abused: Not on file    Physically abused: Not on file    Forced sexual activity: Not on file  Other Topics Concern  . Not on file  Social History Narrative  . Not on file   Family History  Problem Relation Age of Onset  . Stroke Mother   . Cancer Father       VITAL SIGNS BP (!) 108/93   Pulse 98   Temp (!) 97.2 F (36.2 C)   Resp 20   Ht 5\' 2"  (1.575 m)   Wt 145 lb (65.8 kg)   SpO2 100%   BMI 26.52 kg/m   Outpatient Encounter Medications as of 08/14/2018  Medication Sig  . albuterol (PROVENTIL HFA;VENTOLIN HFA) 108 (90 Base) MCG/ACT inhaler Inhale 2 puffs into the lungs every  6 (six) hours as needed.   Marland Kitchen alendronate (FOSAMAX) 70 MG tablet Take 70 mg by mouth once a week. Give on Monday's  . allopurinol (ZYLOPRIM) 100 MG tablet Take 100 mg by mouth daily.  . cycloSPORINE modified (NEORAL) 25 MG capsule Take 50 mg by mouth 2 (two) times daily.   Marland Kitchen DM-Benzocaine-Menthol (CHLORASEPTIC TOTAL) 02-26-09 MG LOZG Use as directed 1 lozenge in the mouth or throat every 2 (two) hours as needed.  . ENSURE (ENSURE) Give 120 cc by mouth two times daily  . fentaNYL (DURAGESIC - DOSED MCG/HR) 25 MCG/HR patch Place 1 patch (25 mcg total) onto the skin every 3 (three) days. Remove per schedule; hold for sedation  . Ferric Citrate (AURYXIA PO) Give 2 tablets by mouth before meals for supplement  . guaiFENesin (MUCINEX) 600 MG 12 hr tablet Take 600 mg by mouth 2 (two) times daily. X 10 days, ending on 08/20/18  . labetalol (NORMODYNE) 100 MG tablet Take 100 mg by mouth 2 (two) times daily.  . Lidocaine 4 % PTCH Apply to skin topically  in the morning for pain, remove in the evening  . loratadine (CLARITIN) 10 MG tablet Take 10 mg by mouth daily. X 10 days  . Melatonin 3 MG TABS Give 2 tablets by mouth at bedtime for insomnia  . Nutritional Supplements (NUTRITIONAL SUPPLEMENT PO) Regular Diet - Regular Texture  . ondansetron (ZOFRAN) 4 MG tablet Take 4 mg by mouth every 6 (six) hours as needed for nausea or vomiting.  . Pancrelipase, Lip-Prot-Amyl, 24000-76000 units CPEP Give 1 capsule by mouth with meals  . pantoprazole (PROTONIX) 40 MG tablet Take 40 mg by mouth 2 (two) times daily.  Marland Kitchen zolpidem (AMBIEN) 5 MG tablet Take 1 tablet (5 mg total) by mouth at bedtime.  . [DISCONTINUED] Oxycodone HCl 10 MG TABS Take 1 tablet (10 mg total) by mouth every 8 (eight) hours as needed (severe pain). - hold for sedation   No facility-administered encounter medications on file as of 08/14/2018.      SIGNIFICANT DIAGNOSTIC EXAMS  06-28-18: wbc 1.7; hgb 7.6; hct 23.0; mcv 93.2; plt 170; glucose 80 bun 18; creat 3.18; k+ 4.2; na++ 136; ca 8.5; liver normal albumin 2.7    TODAY;   08-10-18:wbc 2.7; hgb 8.2; hct 24.6; mcv 93.3; plt 112; glucose 92; bun 14; creat 0.67; k+ 2.6; n++ 137; ca 7.1   Review of Systems  Constitutional: Negative for malaise/fatigue.  Respiratory: Negative for cough and shortness of breath.   Cardiovascular: Negative for chest pain, palpitations and leg swelling.  Gastrointestinal: Negative for abdominal pain, constipation and heartburn.  Musculoskeletal: Negative for back pain, joint pain and myalgias.  Skin: Negative.   Neurological: Negative for dizziness.  Psychiatric/Behavioral: The patient is not nervous/anxious.     Physical Exam  Constitutional: She is oriented to person, place, and time. She appears well-developed and well-nourished. No distress.  Neck: No thyromegaly present.  Cardiovascular: Normal rate, regular rhythm, normal heart sounds and intact distal pulses.  Pulmonary/Chest: Effort normal  and breath sounds normal. No respiratory distress.  Abdominal: Soft. Bowel sounds are normal. She exhibits no distension. There is no tenderness.  Peg tube present without signs of infection present   Musculoskeletal: Normal range of motion. She exhibits no edema.  Lymphadenopathy:    She has no cervical adenopathy.  Neurological: She is alert and oriented to person, place, and time.  Skin: Skin is warm and dry. She is not diaphoretic.  Right chest wall  dialysis access    Psychiatric: She has a normal mood and affect.       ASSESSMENT/ PLAN:  TODAY:   1. Pancreatic insufficiency: is stable will continue pancrelipase, lip-prot-amyl: is stable will continue 24,000-76,000 units three times daily   2.  Depression with anxiety: is stable is currently off medications. Takes melatonin 6 mg nightly for sleep   3.  DDD lumbosacral/chronic pain syndrome: is stable will continue duragesic 25 mcg patch every 3 days, lidoderm patch   4. Essential benign hypertension: is stable  B/p 108/93 will continue labetalol 100 mg twice daily   PREVIOUS   5.  Type 2 diabetes mellitus with end-stage renal disease: is stable will continue to monitor   6. Postmenopausal osteoporosis: is stable will continue fosamax 70 mg weekly   7. Chronic respiratory failure with hypoxia: is stable will continue albuterol 2 puffs every 6 hours as needed is on chronic 02  8.  End stage renal disease on dialysis due to type 2 diabetes mellitus: will continue hemodialysis three times weekly and will monitor  9. Anemia of renal disease: is stable hgb 7.6  10. Immunosuppression: is status post liver transplant: is stable is on long term cyclosporin 50 mg twice daily   11. Chronic gout due to renal impairment: is stable will continue allopurinol 100 mg daily   12. Allergic rhinitis: is improving will complete claritin and mucinex   12. Hypokalemia: is worse k+ 2.6; will give 40 meq k+ one time now and will check in the  AM   MD is aware of resident's narcotic use and is in agreement with current plan of care. We will attempt to wean resident as apropriate   Ok Edwards NP [facility]  Contact [phone] Monday through Friday 8am- 5pm  After hours call [phone]

## 2018-08-16 ENCOUNTER — Encounter: Payer: Self-pay | Admitting: Adult Health

## 2018-08-16 ENCOUNTER — Other Ambulatory Visit: Payer: Self-pay | Admitting: Adult Health

## 2018-08-16 ENCOUNTER — Non-Acute Institutional Stay (SKILLED_NURSING_FACILITY): Payer: Medicaid Other | Admitting: Adult Health

## 2018-08-16 DIAGNOSIS — M5417 Radiculopathy, lumbosacral region: Secondary | ICD-10-CM

## 2018-08-16 DIAGNOSIS — G8929 Other chronic pain: Secondary | ICD-10-CM

## 2018-08-16 DIAGNOSIS — M5137 Other intervertebral disc degeneration, lumbosacral region: Secondary | ICD-10-CM

## 2018-08-16 DIAGNOSIS — M545 Low back pain, unspecified: Secondary | ICD-10-CM

## 2018-08-16 DIAGNOSIS — M5414 Radiculopathy, thoracic region: Secondary | ICD-10-CM

## 2018-08-16 LAB — BASIC METABOLIC PANEL: POTASSIUM: 3.5 (ref 3.4–5.3)

## 2018-08-16 MED ORDER — OXYCODONE HCL 10 MG PO TABS: 10.0000 mg | ORAL_TABLET | Freq: Four times a day (QID) | ORAL | 0 refills | Status: DC | PRN

## 2018-08-16 NOTE — Progress Notes (Signed)
Location:   [facility] Room Number: [place] of Service:  SNF (31)   CODE STATUS: Full Code  Allergies  Allergen Reactions  . Iodinated Diagnostic Agents   . Ioxaglate   . Latex     Chief Complaint  Patient presents with  . Acute Visit    Pain Management    HPI:  She has chronic back pain. She tells me that over the several days her pain has gotten worse and that her current regimen is not effective in managing her pain. She does state that the pain is not interfering with her ability to sleep; but does hinder her participation in therapy    Past Medical History:  Diagnosis Date  . Arthritis   . Gout   . Hepatitis, autoimmune ([place]) 12/08/2011  . Hypertension     Past Surgical History:  Procedure Laterality Date  . ABDOMINAL HYSTERECTOMY    . LIVER TRANSPLANT     1996  . TONSILLECTOMY      Social History   Socioeconomic History  . Marital status: Single    Spouse name: Not on file  . Number of children: Not on file  . Years of education: Not on file  . Highest education level: Not on file  Occupational History  . Not on file  Social Needs  . Financial resource strain: Not on file  . Food insecurity:    Worry: Not on file    Inability: Not on file  . Transportation needs:    Medical: Not on file    Non-medical: Not on file  Tobacco Use  . Smoking status: Never Smoker  . Smokeless tobacco: Never Used  . Tobacco comment: never used tobacco  Substance and Sexual Activity  . Alcohol use: Not Currently    Alcohol/week: 0.0 standard drinks  . Drug use: No  . Sexual activity: Not on file  Lifestyle  . Physical activity:    Days per week: Not on file    Minutes per session: Not on file  . Stress: Not on file  Relationships  . Social connections:    Talks on phone: Not on file    Gets together: Not on file    Attends religious service: Not on file    Active member of club or organization: Not on file    Attends meetings of clubs  or organizations: Not on file    Relationship status: Not on file  . Intimate partner violence:    Fear of current or ex partner: Not on file    Emotionally abused: Not on file    Physically abused: Not on file    Forced sexual activity: Not on file  Other Topics Concern  . Not on file  Social History Narrative  . Not on file   Family History  Problem Relation Age of Onset  . Stroke Mother   . Cancer Father       VITAL SIGNS BP 138/84   Pulse 89   Temp (!) 97.5 F (36.4 C)   Resp 18   Ht 5\' 2"  (1.575 m)   Wt 145 lb (65.8 kg)   SpO2 96%   BMI 26.52 kg/m   Outpatient Encounter Medications as of 08/16/2018  Medication Sig  . albuterol (PROVENTIL HFA;VENTOLIN HFA) 108 (90 Base) MCG/ACT inhaler Inhale 2 puffs into the lungs every 6 (six) hours as needed.   Marland Kitchen alendronate (FOSAMAX) 70 MG tablet Take 70 mg by mouth once a week. Give  on Monday's  . allopurinol (ZYLOPRIM) 100 MG tablet Take 100 mg by mouth daily.  . cycloSPORINE modified (NEORAL) 25 MG capsule Take 50 mg by mouth 2 (two) times daily.   Marland Kitchen DM-Benzocaine-Menthol (CHLORASEPTIC TOTAL) 02-26-09 MG LOZG Use as directed 1 lozenge in the mouth or throat every 2 (two) hours as needed.  . ENSURE (ENSURE) Give 120 cc by mouth two times daily  . fentaNYL (DURAGESIC - DOSED MCG/HR) 25 MCG/HR patch Place 1 patch (25 mcg total) onto the skin every 3 (three) days. Remove per schedule; hold for sedation  . Ferric Citrate (AURYXIA PO) Give 2 tablets by mouth before meals for supplement  . guaiFENesin (MUCINEX) 600 MG 12 hr tablet Take 600 mg by mouth 2 (two) times daily. X 10 days, ending on 08/20/18  . labetalol (NORMODYNE) 100 MG tablet Take 100 mg by mouth 2 (two) times daily.  . Lidocaine 4 % PTCH Apply to skin topically in the morning for pain, remove in the evening  . loratadine (CLARITIN) 10 MG tablet Take 10 mg by mouth daily. X 10 days  . Melatonin 3 MG TABS Give 2 tablets by mouth at bedtime for insomnia  . Nutritional  Supplements (NUTRITIONAL SUPPLEMENT PO) Regular Diet - Regular Texture  . ondansetron (ZOFRAN) 4 MG tablet Take 4 mg by mouth every 6 (six) hours as needed for nausea or vomiting.  . Oxycodone HCl 10 MG TABS Take 1 tablet (10 mg total) by mouth every 8 (eight) hours as needed (severe pain). - hold for sedation  . Pancrelipase, Lip-Prot-Amyl, 24000-76000 units CPEP Give 1 capsule by mouth with meals  . pantoprazole (PROTONIX) 40 MG tablet Take 40 mg by mouth 2 (two) times daily.  Marland Kitchen zolpidem (AMBIEN) 5 MG tablet Take 1 tablet (5 mg total) by mouth at bedtime.   No facility-administered encounter medications on file as of 08/16/2018.      SIGNIFICANT DIAGNOSTIC EXAMS  LABS REVIEWED PREVIOUS   06-28-18: wbc 1.7; hgb 7.6; hct 23.0; mcv 93.2; plt 170; glucose 80 bun 18; creat 3.18; k+ 4.2; na++ 136; ca 8.5; liver normal albumin 2.7  08-10-18:wbc 2.7; hgb 8.2; hct 24.6; mcv 93.3; plt 112; glucose 92; bun 14; creat 0.67; k+ 2.6; n++ 137; ca 7.1   NO NEW LABS.     Review of Systems  Constitutional: Negative for malaise/fatigue.  Respiratory: Negative for cough and shortness of breath.   Cardiovascular: Negative for chest pain, palpitations and leg swelling.  Gastrointestinal: Negative for abdominal pain, constipation and heartburn.  Musculoskeletal: Positive for back pain and myalgias. Negative for joint pain.  Skin: Negative.   Neurological: Negative for dizziness.  Psychiatric/Behavioral: The patient is not nervous/anxious.        Physical Exam  Constitutional: She is oriented to person, place, and time. She appears well-developed and well-nourished. No distress.  Neck: No thyromegaly present.  Cardiovascular: Normal rate, regular rhythm, normal heart sounds and intact distal pulses.  Pulmonary/Chest: Effort normal and breath sounds normal. No respiratory distress.  Abdominal: Soft. Bowel sounds are normal. She exhibits distension. There is no tenderness.  Peg tube present without  signs of infection present.   Musculoskeletal: She exhibits no edema.  Is able to move all extremities   Lymphadenopathy:    She has no cervical adenopathy.  Neurological: She is alert and oriented to person, place, and time.  Skin: Skin is warm and dry. She is not diaphoretic.  Right chest wall dialysis access   Psychiatric: She has a  normal mood and affect.       ASSESSMENT/ PLAN:  TODAY:   1.  DDD lumbosacral/chronic pain syndrome: is stable will continue duragesic 25 mcg patch every 3 days, lidoderm patch   Will change her oxycodone to 10 mg every 6 hours as needed.        MD is aware of resident's narcotic use and is in agreement with current plan of care. We will attempt to wean resident as apropriate   Ok Edwards NP [facility]  Contact [phone] Monday through Friday 8am- 5pm  After hours call [phone]

## 2018-08-17 ENCOUNTER — Other Ambulatory Visit: Payer: Self-pay | Admitting: *Deleted

## 2018-08-17 DIAGNOSIS — N186 End stage renal disease: Secondary | ICD-10-CM

## 2018-08-20 DIAGNOSIS — J309 Allergic rhinitis, unspecified: Secondary | ICD-10-CM | POA: Insufficient documentation

## 2018-08-21 NOTE — Unmapped (Signed)
Called patients preferred lab at Phone# [phone]   To request csA result from 10/18 lab draw. Unable to reach anyone, left detailed VM requesting a call back at [phone] or fax result at [phone].

## 2018-08-22 NOTE — Unmapped (Signed)
Patient called asking about permission to get a Hepatitis B shot from the Dialysis center.     Coordinator to confirm that a Hep B shot is safe and she should call us with the date of receiving immunization for documentation.     Patient verbalized understanding.

## 2018-08-22 NOTE — Unmapped (Signed)
Coordinator called nursing home to request another lab draw because last results dated 10-18 did not result a cyclosporine level.     Sent new lab order letter to [facility] [place] and coordinator will call to confirm they've received it.

## 2018-08-29 NOTE — Unmapped (Signed)
[facility] Refill and Clinical Coordination Note  Medication(s): CYCLOSPORINE    Katherine Norton, DOB: 01-13-1956  Phone: [phone] (home) , Alternate phone contact: N/A  Shipping address: [address]  [place] [address]  Phone or address changes today?: No  All above HIPAA information verified.  Insurance changes? No    Completed refill and clinical call assessment today to schedule patient's medication shipment from the [facility] [phone]).      MEDICATION RECONCILIATION    Confirmed the medication and dosage are correct and have not changed: No, patient reports changes to the regimen as follows: now on cyclosporine 25mg  2am/2pm - we do not have any rxs for this med at all, and [facility], will request new rx from clinic today to put on profile    Were there any changes to your medication(s) in the past month:  No, there are no changes reported at this time.    ADHERENCE    Is this medicine transplant or covered by Medicare Part B? Yes.    Cyclosporine modified 25mg : Patient has over 3 weeks worth of capsules on hand.    Did you miss any doses in the past 4 weeks? No missed doses reported.  Adherence counseling provided? Not needed     SIDE EFFECT MANAGEMENT    Are you tolerating your medication?:  Katherine Norton reports tolerating the medication.  Side effect management discussed: None      Therapy is appropriate and should be continued.    Evidence of clinical benefit: See Epic note from 01/15/18      FINANCIAL/SHIPPING    Delivery Scheduled: Patient declined refill at this time due to she is currently in a rehab facility and getting med from their supplier - she thinks she has 3 weeks atleast left of med and will be leaving rehab to go home next week. She states she wants to call us next week when she gets out to set up delivery. I will request new rx for on file purposes today and set up call from [name] late next week to touch base.     Medication will be delivered via n/a to the n/a address in [facility].    Additional medications refilled: No additional medications/refills needed at this time.    The patient will receive a drug information handout for each medication shipped and additional FDA Medication Guides as required.      Katherine Norton did not have any additional questions at this time.    Delivery address confirmed in Epic.     We will follow up with patient monthly for standard refill processing and delivery.      Thank you,  Thad Ranger   [facility] Specialty Pharmacist

## 2018-09-04 NOTE — Unmapped (Addendum)
Called patient nursing home and they notified that the patient was discharged week of 11-11  Coordinator reached out to patient and family. awaiting a call back.

## 2018-09-04 NOTE — Unmapped (Signed)
Received fax from Dialysis center documenting patient received Prevnar 23, noted in immunization tab.

## 2018-09-06 NOTE — Unmapped (Signed)
Called numbers listed on patients chart to follow up on patient being discharged from nursing home.   Left VM to have family or the patient call back coordinator.

## 2018-09-07 NOTE — Unmapped (Signed)
[facility] Refill Coordination Note    Specialty Medication(s) to be Shipped:   Transplant: Neoral 25mg     Other medication(s) to be shipped: none     Katherine Norton, DOB: 06/27/1956  Phone: [phone] (home)       All above HIPAA information was verified with patient.     Completed refill call assessment today to schedule patient's medication shipment from the [facility] [phone]).       Specialty medication(s) and dose(s) confirmed: Regimen is correct and unchanged.   Changes to medications: Katherine Norton reports no changes reported at this time.  Changes to insurance: No  Questions for the pharmacist: No    The patient will receive a drug information handout for each medication shipped and additional FDA Medication Guides as required.      DISEASE/MEDICATION-SPECIFIC INFORMATION        N/A    ADHERENCE     Medication Adherence    Patient reported X missed doses in the last month:  0                          MEDICARE PART B DOCUMENTATION     Neoral 25mg : Patient has 7 days worth of capsules on hand.    SHIPPING     Shipping address confirmed in Epic.     Delivery Scheduled: Yes, Expected medication delivery date: 09/11/18  via UPS or courier.     Medication will be delivered via UPS to the home address in Epic WAM.    Katherine Norton   [facility] Specialty Technician

## 2018-09-10 MED ORDER — NEORAL 25 MG CAPSULE
8 refills | 0 days | Status: CP
Start: 2018-09-10 — End: 2019-04-30
  Filled 2018-09-10: qty 120, 30d supply, fill #0

## 2018-09-10 MED FILL — NEORAL 25 MG CAPSULE: 30 days supply | Qty: 120 | Fill #0 | Status: AC

## 2018-09-12 ENCOUNTER — Telehealth: Payer: Self-pay | Admitting: Family

## 2018-09-12 NOTE — Telephone Encounter (Signed)
Received call from pt to r/s missed Sept appts for 09/27/18 at 1 pm.

## 2018-09-18 NOTE — Unmapped (Signed)
Called patients daughter, to reconnect since patient was recently discharged from a nursing home. This was second call attempt and message to leave for family.  Patients listed number has no answer, and no voice mial box set Up at this time.   Left coordinators name and phone number requesting a call back.

## 2018-09-24 ENCOUNTER — Telehealth: Payer: Self-pay | Admitting: Family

## 2018-09-24 NOTE — Telephone Encounter (Signed)
I spoke with patient regarding r/s his appointments from 12/5 due to provider out of office/bereavement. New date/time ok per patient

## 2018-09-25 ENCOUNTER — Ambulatory Visit (INDEPENDENT_AMBULATORY_CARE_PROVIDER_SITE_OTHER)
Admission: RE | Admit: 2018-09-25 | Discharge: 2018-09-25 | Disposition: A | Discharge status: Home / Self Care | Payer: Medicaid Other | Source: Ambulatory Visit | Attending: Vascular Surgery | Admitting: Vascular Surgery

## 2018-09-25 ENCOUNTER — Encounter: Payer: Self-pay | Admitting: *Deleted

## 2018-09-25 ENCOUNTER — Other Ambulatory Visit: Payer: Self-pay

## 2018-09-25 ENCOUNTER — Ambulatory Visit (HOSPITAL_COMMUNITY)
Admission: RE | Admit: 2018-09-25 | Discharge: 2018-09-25 | Disposition: A | Discharge status: Home / Self Care | Payer: Medicaid Other | Source: Ambulatory Visit | Attending: Vascular Surgery | Admitting: Vascular Surgery

## 2018-09-25 ENCOUNTER — Other Ambulatory Visit: Payer: Self-pay | Admitting: *Deleted

## 2018-09-25 ENCOUNTER — Ambulatory Visit (INDEPENDENT_AMBULATORY_CARE_PROVIDER_SITE_OTHER): Payer: Medicaid Other | Admitting: Vascular Surgery

## 2018-09-25 ENCOUNTER — Encounter: Payer: Self-pay | Admitting: Vascular Surgery

## 2018-09-25 VITALS — BP 114/90 | HR 83 | Temp 98.0°F | Resp 18 | Ht 62.0 in | Wt 136.0 lb

## 2018-09-25 DIAGNOSIS — N186 End stage renal disease: Secondary | ICD-10-CM | POA: Insufficient documentation

## 2018-09-25 NOTE — Progress Notes (Signed)
Vascular and Vein Specialist of [place]  Patient name: Tami [name] MRN: [id number] DOB: 01/11/1956 Sex: female  REASON FOR CONSULT: Discussed options for hemodialysis access  HPI: Tami [name] is a 62 y.o. female, who is here today for discussion of hemodialysis access options.  She is here today with her brother.  She was initiated on hemodialysis around September 2019 via a right IJ catheter.  She reports that this is functioned well for her.  She is here today for discussion of permanent access.  She is right-handed.  She does not have any history of prior pacemaker.  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  Past Medical History:  Diagnosis Date  . Arthritis   . Chronic kidney disease    vas cath/M-W-F dialysis  . Gout   . Hepatitis, autoimmune ([place]) 12/08/2011  . Hypertension     Family History  Problem Relation Age of Onset  . Stroke Mother   . Cancer Father     SOCIAL HISTORY: Social History   Socioeconomic History  . Marital status: Single    Spouse name: Not on file  . Number of children: Not on file  . Years of education: Not on file  . Highest education level: Not on file  Occupational History  . Not on file  Social Needs  . Financial resource strain: Not on file  . Food insecurity:    Worry: Not on file    Inability: Not on file  . Transportation needs:    Medical: Not on file    Non-medical: Not on file  Tobacco Use  . Smoking status: Never Smoker  . Smokeless tobacco: Never Used  . Tobacco comment: never used tobacco  Substance and Sexual Activity  . Alcohol use: Not Currently    Alcohol/week: 0.0 standard drinks  . Drug use: No  . Sexual activity: Not on file  Lifestyle  . Physical activity:    Days per week: Not on file    Minutes per session: Not on file  . Stress: Not on file  Relationships  . Social connections:    Talks on phone: Not on file    Gets together: Not on  file    Attends religious service: Not on file    Active member of club or organization: Not on file    Attends meetings of clubs or organizations: Not on file    Relationship status: Not on file  . Intimate partner violence:    Fear of current or ex partner: Not on file    Emotionally abused: Not on file    Physically abused: Not on file    Forced sexual activity: Not on file  Other Topics Concern  . Not on file  Social History Narrative  . Not on file    Allergies  Allergen Reactions  . Iodinated Diagnostic Agents   . Ioxaglate   . Latex     Current Outpatient Medications  Medication Sig Dispense Refill  . albuterol (PROVENTIL HFA;VENTOLIN HFA) 108 (90 Base) MCG/ACT inhaler Inhale 2 puffs into the lungs every 6 (six) hours as needed.     Marland Kitchen alendronate (FOSAMAX) 70 MG tablet Take 70 mg by mouth once a week. Give on Monday's  1  . allopurinol (ZYLOPRIM) 100 MG tablet Take 100 mg by mouth daily.  5  . cycloSPORINE modified (NEORAL) 25 MG capsule Take 50 mg by mouth 2 (two) times daily.     Marland Kitchen DM-Benzocaine-Menthol (CHLORASEPTIC  TOTAL) 02-26-09 MG LOZG Use as directed 1 lozenge in the mouth or throat every 2 (two) hours as needed.    . ENSURE (ENSURE) Give 120 cc by mouth two times daily    . Ferric Citrate (AURYXIA PO) Give 2 tablets by mouth before meals for supplement    . labetalol (NORMODYNE) 100 MG tablet Take 100 mg by mouth 2 (two) times daily.    . Lidocaine 4 % PTCH Apply to skin topically in the morning for pain, remove in the evening    . Melatonin 3 MG TABS Give 2 tablets by mouth at bedtime for insomnia    . Nutritional Supplements (NUTRITIONAL SUPPLEMENT PO) Regular Diet - Regular Texture    . ondansetron (ZOFRAN) 4 MG tablet Take 4 mg by mouth every 6 (six) hours as needed for nausea or vomiting.    . Oxycodone HCl 10 MG TABS Take 1 tablet (10 mg total) by mouth every 6 (six) hours as needed (severe pain). - hold for sedation 120 tablet 0  . Pancrelipase,  Lip-Prot-Amyl, 24000-76000 units CPEP Give 1 capsule by mouth with meals    . pantoprazole (PROTONIX) 40 MG tablet Take 40 mg by mouth 2 (two) times daily.    Marland Kitchen zolpidem (AMBIEN) 5 MG tablet Take 1 tablet (5 mg total) by mouth at bedtime. 30 tablet 0  . loratadine (CLARITIN) 10 MG tablet Take 10 mg by mouth daily. X 10 days     No current facility-administered medications for this visit.     REVIEW OF SYSTEMS:  [X]  denotes positive finding, [ ]  denotes negative finding Cardiac  Comments:  Chest pain or chest pressure:    Shortness of breath upon exertion:    Short of breath when lying flat:    Irregular heart rhythm:        Vascular    Pain in calf, thigh, or hip brought on by ambulation:    Pain in feet at night that wakes you up from your sleep:     Blood clot in your veins:    Leg swelling:         Pulmonary    Oxygen at home:    Productive cough:     Wheezing:         Neurologic    Sudden weakness in arms or legs:     Sudden numbness in arms or legs:     Sudden onset of difficulty speaking or slurred speech:    Temporary loss of vision in one eye:     Problems with dizziness:         Gastrointestinal    Blood in stool:     Vomited blood:         Genitourinary    Burning when urinating:     Blood in urine:        Psychiatric    Major depression:         Hematologic    Bleeding problems:    Problems with blood clotting too easily:        Skin    Rashes or ulcers:        Constitutional    Fever or chills:      PHYSICAL EXAM: Vitals:   09/25/18 1331  BP: 114/90  Pulse: 83  Resp: 18  Temp: 98 F (36.7 C)  SpO2: 99%  Weight: 136 lb (61.7 kg)  Height: 5\' 2"  (1.575 m)    GENERAL: The patient is a well-nourished female, in  no acute distress. The vital signs are documented above. CARDIOVASCULAR: Plus radial pulses bilaterally.  Moderate size antecubital veins bilaterally.  Other surface veins are not visible PULMONARY: There is good air exchange    ABDOMEN: Soft and non-tender  MUSCULOSKELETAL: There are no major deformities or cyanosis. NEUROLOGIC: No focal weakness or paresthesias are detected. SKIN: There are no ulcers or rashes noted. PSYCHIATRIC: The patient has a normal affect.  DATA:  Upper extremity noninvasive studies reveal triphasic waveforms at the brachial, radial and ulnar arteries bilaterally  Upper extremity venous studies reveal moderate size cephalic vein on the left 3-1/2 mm at the antecubital fossa and 3 mm throughout the upper arm.  The left basilic vein is somewhat larger caliber in the mid 4.5 mm range.  MEDICAL ISSUES: I discussed options at length with the patient and her brother.  Explained the advantages and disadvantages of catheter versus fistula versus graft.  She does appear to have adequate vein size on her left nondominant arm for fistula creation.  Explained that we would make this final determination at the time of surgery depending on the vein size.  She has hemodialysis on Monday Wednesday and Friday so we will schedule her for a Tuesday or Thursday outpatient surgery at [facility].  She understands that 1 of the surgeons from our practice will be doing the procedure but most likely not myself.  She is comfortable with this and we will schedule this at her earliest [place]. , MD [facility] Special[facility] Tel [phone] Pager [phone]

## 2018-09-27 ENCOUNTER — Other Ambulatory Visit: Payer: Medicaid Other

## 2018-09-27 ENCOUNTER — Ambulatory Visit: Payer: Medicaid Other

## 2018-09-27 ENCOUNTER — Ambulatory Visit: Payer: Medicaid Other | Admitting: Family

## 2018-10-01 ENCOUNTER — Other Ambulatory Visit: Payer: Self-pay

## 2018-10-01 ENCOUNTER — Encounter (HOSPITAL_COMMUNITY): Payer: Self-pay | Admitting: *Deleted

## 2018-10-01 NOTE — Anesthesia Preprocedure Evaluation (Addendum)
Anesthesia Evaluation  Patient identified by MRN, date of birth, ID band Patient awake    Reviewed: Allergy & Precautions, H&P , NPO status , Patient's Chart, lab work & pertinent test results  Airway Mallampati: II  TM Distance: >3 FB Neck ROM: Full    Dental no notable dental hx. (+) Edentulous Upper, Edentulous Lower, Dental Advisory Given   Pulmonary neg pulmonary ROS,    Pulmonary exam normal breath sounds clear to auscultation       Cardiovascular Exercise Tolerance: Good hypertension,  Rhythm:Regular Rate:Normal     Neuro/Psych Anxiety Depression negative neurological ROS     GI/Hepatic negative GI ROS, (+) Hepatitis -  Endo/Other  diabetes  Renal/GU ESRF and DialysisRenal disease  negative genitourinary   Musculoskeletal  (+) Arthritis , Osteoarthritis,    Abdominal   Peds  Hematology  (+) Blood dyscrasia, anemia ,   Anesthesia Other Findings   Reproductive/Obstetrics negative OB ROS                           Anesthesia Physical Anesthesia Plan  ASA: III  Anesthesia Plan: MAC   Post-op Pain Management:    Induction: Intravenous  PONV Risk Score and Plan: 3 and Propofol infusion, Midazolam and Ondansetron  Airway Management Planned: Simple Face Mask  Additional Equipment:   Intra-op Plan:   Post-operative Plan:   Informed Consent: I have reviewed the patients History and Physical, chart, labs and discussed the procedure including the risks, benefits and alternatives for the proposed anesthesia with the patient or authorized representative who has indicated his/her understanding and acceptance.   Dental advisory given  Plan Discussed with: CRNA  Anesthesia Plan Comments: (See PAT note 10/01/2018 by Karoline Caldwell, PA-C )       Anesthesia Quick Evaluation

## 2018-10-01 NOTE — Progress Notes (Signed)
Anesthesia Chart Review: SAME DAY WORKUP   Case:  [id number] Date/Time:  10/02/18 0720   Procedure:  ARTERIOVENOUS (AV) FISTULA CREATION VERSUS GRAFT PLACEMENT ARM (Left )   Anesthesia type:  Monitor Anesthesia Care   Pre-op diagnosis:  end stage renal disease   Location:  [place] OR ROOM 12 / [place] OR   Surgeon:  Waynetta Sandy, MD      DISCUSSION: 62 yo female never smoker. She has a complex recent medical history includes s/p liver transplant for autoimmune hepatitis and ESRD on HD. She was recently admitted to [facility] 9/5-9/27/2019. I have copied to discharge summary below:  "A complicated medical history and hospitalization for almost 2 months now. Tami [name] with PMH of autoimmune hepatitis s/p liver transplant 1999 on chronic cyclosporine, DM, chronic back pain who was admitted to [facility] 05/20/18 for acute hypoxic respiratory failure. She required intubation on 05/24/18 due to worsened respiratory status. Treated with vasopressors. Her AKI has been persistent and Lasix gtt and bolus pushes have been used at various points to promote diuresis. Patient ultimately ended up being dialysis dependent and currently remains on intermittent dialysis. Had worsening clinical status, found to have perforated duodenal ulcer. Then she was transferred to [facility] because of transplant surgery. Complicated course at [facility]. She was ultimately transferred back to the [facility] on 06/28/2018 with placement of tracheostomy. Arrival to [facility], underwent PEG tube placement on 07/03/2018. Continue to have intermittent fever that was treated with broad-spectrum antibiotics. She developed acute GI bleed requiring 4 more units of PRBC. Upper GI endoscopy showed bleeding through the PEG tube site and they applied a Hemoclip. After complicated hospitalization, she has been a stabilized now. She will be transferred to a skilled level of care. Patient had multiple issues that are elaborated as bullet points below.  -Acute  hypoxic respiratory failure secondary to pneumonia and sepsis during initial admission on 05/20/2018. sepsis resolved, unknown causative organism. S/p intubation and tracheostomy done on 06/26/2018 at [facility], off ventilator since 06/22/2018. S/p PEG tube insertion on 07/03/18. Continue breathing treatments as needed. Trach decannulation done and now normalized. Patient mostly remains on room air.  -Fever unknown source with sepsis present on admission: Finished variety of antibiotics. Currently without any evidence of ongoing infection.  -Upper GI bleeding due to gastric ulcer s/p EGD and Hemoclip placement by GI on 07/05/2018. S/p 4 units PRBC transfusion. Hemoglobin is stable. She is on oral Protonix now that she will continue.   -Dysphagia: Secondary to multiple medical problems. Patient is now able to meet all her demand by mouth. She is on mechanical soft diet. We will continue to need a speech swallow evaluation. PEG tube is clamped.  -Acute on chronic kidney injury: Now dialysis dependent. She is on intermittent dialysis with TTS schedule.  -Diarrhea: Continue Imodium 2 mg every 6 hourly as needed for diarrhea  -Hypertension, increased labetalol 100 mg every 12 hours on 07/09/2018. Monitor BP and adjust medication accordingly   -History of liver transplant, immunocompromised, chronic neutropenia, continue cyclosporine 75 mg p.o. twice daily .   -Chronic pain, back: On lidocaine patch. Use prn oxycodone as well as fentanyl patch. She will follow-up with her pain management clinic on discharge.   -Stage II pressure ulcer present on admission, continue dressing and frequent turning"  Review of notes in care everywhere shows that PEG tube has been removed.  She will need DOS eval by assigned anesthesiologist. Anticipate she can proceed as planned if DOS labs acceptable  and pt clinically stable.   VS: There were no vitals taken for this visit.  PROVIDERS: [facility] [place] is  PCP  Follows at [facility] for transplant hepatology  LABS: Will need DOS labs. Pt has pancytopenia s/p liver transplant and anemia of chronic disease.  IMAGES: PORTABLE CHEST 1 VIEW 07/06/2018 (care everywhere):  COMPARISON: 07/02/2018.  FINDINGS: Stable right jugular catheter tip in the right atrium. Stable enlarged cardiac silhouette. The left hemidiaphragm remains mildly elevated. Poor inspiration with diffuse crowding of the pulmonary vasculature and interstitial markings with mild progression. Mild bibasilar atelectasis. Mild scoliosis. Upper abdominal surgical clips.  IMPRESSION: Poor inspiration with mild bibasilar atelectasis and grossly stable cardiomegaly, pulmonary vascular congestion and mild chronic interstitial lung disease.  EKG: 06/29/2018 (care everywhere, narrative only): Sinus rhythm. Rate 75.  First degree A-V block. Right bundle branch block  CV: TTE 06/09/2018 (care everywhere): Result Narrative   Technically difficult study due to chest wall/lung interference  Dilated right ventricle - severe  Severely decreased right ventricular systolic function  Tricuspid regurgitation - mild to moderate  Dilated right atrium - severe  Elevated pulmonary artery systolic pressure - severe  Normal left ventricular systolic function, ejection fraction > 55%   TTE 05/21/2018 (care everywhere): Conclusions Summary TDS Tricuspid valve is structurally normal. Mild tricuspid regurgitation. Moderate pulmonary hypertension. Estimated RVSP 65 mm Hg. Normal left ventricular size and systolic function with no appreciable segmental abnormality. Ejection fraction is estimated at 65-70% Normal left ventricular wall thickness The aortic root diameter is within normal limits. The IVC is dilated and does not collapse  Past Medical History:  Diagnosis Date  . Arthritis   . Chronic kidney disease    vas cath/M-W-F dialysis  . Gout   . Hepatitis, autoimmune ([place]) 12/08/2011   . Hypertension     Past Surgical History:  Procedure Laterality Date  . ABDOMINAL HYSTERECTOMY    . LIVER TRANSPLANT     1996  . TONSILLECTOMY      MEDICATIONS: No current facility-administered medications for this encounter.    Marland Kitchen albuterol (PROVENTIL HFA;VENTOLIN HFA) 108 (90 Base) MCG/ACT inhaler  . alendronate (FOSAMAX) 70 MG tablet  . allopurinol (ZYLOPRIM) 100 MG tablet  . cycloSPORINE modified (NEORAL) 25 MG capsule  . DM-Benzocaine-Menthol (CHLORASEPTIC TOTAL) 02-26-09 MG LOZG  . ENSURE (ENSURE)  . Ferric Citrate (AURYXIA PO)  . labetalol (NORMODYNE) 100 MG tablet  . Lidocaine 4 % PTCH  . loratadine (CLARITIN) 10 MG tablet  . Melatonin 3 MG TABS  . Nutritional Supplements (NUTRITIONAL SUPPLEMENT PO)  . ondansetron (ZOFRAN) 4 MG tablet  . Oxycodone HCl 10 MG TABS  . Pancrelipase, Lip-Prot-Amyl, 24000-76000 units CPEP  . pantoprazole (PROTONIX) 40 MG tablet  . zolpidem (AMBIEN) 5 MG tablet    [place], Marton [facility] Short Stay Center/Anesthesiology Phone [phone] 10/01/2018 9:50 AM

## 2018-10-01 NOTE — Progress Notes (Signed)
Spoke with pt for pre-op call. Pt denies cardiac history or diabetes. Pt has had a liver transplant in the past.

## 2018-10-02 ENCOUNTER — Telehealth: Payer: Self-pay | Admitting: Vascular Surgery

## 2018-10-02 ENCOUNTER — Ambulatory Visit (HOSPITAL_COMMUNITY)
Admission: RE | Admit: 2018-10-02 | Discharge: 2018-10-02 | Disposition: A | Discharge status: Home / Self Care | Payer: Medicaid Other | Attending: Vascular Surgery | Admitting: Vascular Surgery

## 2018-10-02 ENCOUNTER — Encounter (HOSPITAL_COMMUNITY): Payer: Self-pay | Admitting: *Deleted

## 2018-10-02 ENCOUNTER — Ambulatory Visit (HOSPITAL_COMMUNITY): Payer: Medicaid Other | Admitting: Physician Assistant

## 2018-10-02 ENCOUNTER — Encounter (HOSPITAL_COMMUNITY)
Admission: RE | Disposition: A | Discharge status: Home / Self Care | Payer: Self-pay | Source: Home / Self Care | Attending: Vascular Surgery

## 2018-10-02 DIAGNOSIS — Z91040 Latex allergy status: Secondary | ICD-10-CM | POA: Insufficient documentation

## 2018-10-02 DIAGNOSIS — E1122 Type 2 diabetes mellitus with diabetic chronic kidney disease: Secondary | ICD-10-CM | POA: Insufficient documentation

## 2018-10-02 DIAGNOSIS — M5137 Other intervertebral disc degeneration, lumbosacral region: Secondary | ICD-10-CM

## 2018-10-02 DIAGNOSIS — E785 Hyperlipidemia, unspecified: Secondary | ICD-10-CM | POA: Diagnosis not present

## 2018-10-02 DIAGNOSIS — Z91041 Radiographic dye allergy status: Secondary | ICD-10-CM | POA: Insufficient documentation

## 2018-10-02 DIAGNOSIS — I272 Pulmonary hypertension, unspecified: Secondary | ICD-10-CM | POA: Diagnosis not present

## 2018-10-02 DIAGNOSIS — Z944 Liver transplant status: Secondary | ICD-10-CM | POA: Diagnosis not present

## 2018-10-02 DIAGNOSIS — M5414 Radiculopathy, thoracic region: Secondary | ICD-10-CM

## 2018-10-02 DIAGNOSIS — Z992 Dependence on renal dialysis: Secondary | ICD-10-CM | POA: Diagnosis not present

## 2018-10-02 DIAGNOSIS — I120 Hypertensive chronic kidney disease with stage 5 chronic kidney disease or end stage renal disease: Secondary | ICD-10-CM | POA: Diagnosis present

## 2018-10-02 DIAGNOSIS — Z888 Allergy status to other drugs, medicaments and biological substances status: Secondary | ICD-10-CM | POA: Diagnosis not present

## 2018-10-02 DIAGNOSIS — N186 End stage renal disease: Secondary | ICD-10-CM | POA: Insufficient documentation

## 2018-10-02 DIAGNOSIS — G8929 Other chronic pain: Secondary | ICD-10-CM

## 2018-10-02 DIAGNOSIS — M545 Low back pain, unspecified: Secondary | ICD-10-CM

## 2018-10-02 DIAGNOSIS — M109 Gout, unspecified: Secondary | ICD-10-CM | POA: Diagnosis not present

## 2018-10-02 DIAGNOSIS — M1990 Unspecified osteoarthritis, unspecified site: Secondary | ICD-10-CM | POA: Insufficient documentation

## 2018-10-02 DIAGNOSIS — M5417 Radiculopathy, lumbosacral region: Secondary | ICD-10-CM

## 2018-10-02 DIAGNOSIS — Z79899 Other long term (current) drug therapy: Secondary | ICD-10-CM | POA: Diagnosis not present

## 2018-10-02 DIAGNOSIS — N185 Chronic kidney disease, stage 5: Secondary | ICD-10-CM | POA: Diagnosis not present

## 2018-10-02 HISTORY — DX: Anemia, unspecified: D64.9

## 2018-10-02 HISTORY — PX: AV FISTULA PLACEMENT: SHX1204

## 2018-10-02 LAB — GLUCOSE, CAPILLARY: Glucose-Capillary: 81 mg/dL (ref 70–99)

## 2018-10-02 LAB — POCT I-STAT 4, (NA,K, GLUC, HGB,HCT)
Glucose, Bld: 94 mg/dL (ref 70–99)
HCT: 34 % — ABNORMAL LOW (ref 36.0–46.0)
Hemoglobin: 11.6 g/dL — ABNORMAL LOW (ref 12.0–15.0)
Potassium: 3.7 mmol/L (ref 3.5–5.1)
Sodium: 139 mmol/L (ref 135–145)

## 2018-10-02 SURGERY — ARTERIOVENOUS (AV) FISTULA CREATION
Anesthesia: Monitor Anesthesia Care | Site: Arm Upper | Laterality: Left

## 2018-10-02 MED ORDER — MIDAZOLAM HCL 2 MG/2ML IJ SOLN
INTRAMUSCULAR | Status: DC | PRN
  Administered 2018-10-02 (×2): 1 mg via INTRAVENOUS

## 2018-10-02 MED ORDER — ONDANSETRON HCL 4 MG/2ML IJ SOLN
INTRAMUSCULAR | Status: AC
  Filled 2018-10-02: qty 2

## 2018-10-02 MED ORDER — OXYCODONE HCL 10 MG PO TABS: 10.0000 mg | ORAL_TABLET | Freq: Four times a day (QID) | ORAL | 0 refills | Status: DC | PRN

## 2018-10-02 MED ORDER — FENTANYL CITRATE (PF) 100 MCG/2ML IJ SOLN
INTRAMUSCULAR | Status: AC
  Filled 2018-10-02: qty 2

## 2018-10-02 MED ORDER — PROPOFOL 500 MG/50ML IV EMUL
INTRAVENOUS | Status: DC | PRN
  Administered 2018-10-02: 50 ug/kg/min via INTRAVENOUS

## 2018-10-02 MED ORDER — PROPOFOL 10 MG/ML IV BOLUS
INTRAVENOUS | Status: AC
  Filled 2018-10-02: qty 20

## 2018-10-02 MED ORDER — FENTANYL CITRATE (PF) 100 MCG/2ML IJ SOLN
25.0000 ug | INTRAMUSCULAR | Status: DC | PRN
  Administered 2018-10-02: 50 ug via INTRAVENOUS

## 2018-10-02 MED ORDER — MIDAZOLAM HCL 2 MG/2ML IJ SOLN
INTRAMUSCULAR | Status: AC
  Filled 2018-10-02: qty 2

## 2018-10-02 MED ORDER — LIDOCAINE HCL (CARDIAC) PF 100 MG/5ML IV SOSY
PREFILLED_SYRINGE | INTRAVENOUS | Status: DC | PRN
  Administered 2018-10-02: 40 mg via INTRAVENOUS

## 2018-10-02 MED ORDER — HEPARIN SODIUM (PORCINE) 1000 UNIT/ML IJ SOLN
INTRAMUSCULAR | Status: AC
  Filled 2018-10-02: qty 1

## 2018-10-02 MED ORDER — LIDOCAINE HCL (PF) 1 % IJ SOLN
INTRAMUSCULAR | Status: AC
  Filled 2018-10-02: qty 30

## 2018-10-02 MED ORDER — SODIUM CHLORIDE 0.9 % IV SOLN
INTRAVENOUS | Status: AC
  Filled 2018-10-02: qty 1.2

## 2018-10-02 MED ORDER — FENTANYL CITRATE (PF) 250 MCG/5ML IJ SOLN
INTRAMUSCULAR | Status: AC
  Filled 2018-10-02: qty 5

## 2018-10-02 MED ORDER — LIDOCAINE-EPINEPHRINE 1 %-1:100000 IJ SOLN
INTRAMUSCULAR | Status: AC
  Filled 2018-10-02: qty 1

## 2018-10-02 MED ORDER — SODIUM CHLORIDE 0.9 % IV SOLN
INTRAVENOUS | Status: DC | PRN
  Administered 2018-10-02: 50 ug/min via INTRAVENOUS

## 2018-10-02 MED ORDER — FENTANYL CITRATE (PF) 250 MCG/5ML IJ SOLN
INTRAMUSCULAR | Status: DC | PRN
  Administered 2018-10-02 (×4): 25 ug via INTRAVENOUS

## 2018-10-02 MED ORDER — ONDANSETRON HCL 4 MG/2ML IJ SOLN
INTRAMUSCULAR | Status: DC | PRN
  Administered 2018-10-02: 4 mg via INTRAVENOUS

## 2018-10-02 MED ORDER — LIDOCAINE-EPINEPHRINE 1 %-1:100000 IJ SOLN
INTRAMUSCULAR | Status: DC | PRN
  Administered 2018-10-02: 5 mL

## 2018-10-02 MED ORDER — PROPOFOL 1000 MG/100ML IV EMUL
INTRAVENOUS | Status: AC
  Filled 2018-10-02: qty 100

## 2018-10-02 MED ORDER — LIDOCAINE 2% (20 MG/ML) 5 ML SYRINGE
INTRAMUSCULAR | Status: AC
  Filled 2018-10-02: qty 5

## 2018-10-02 MED ORDER — SODIUM CHLORIDE 0.9 % IV SOLN
INTRAVENOUS | Status: DC | PRN
  Administered 2018-10-02: 500 mL

## 2018-10-02 MED ORDER — 0.9 % SODIUM CHLORIDE (POUR BTL) OPTIME
TOPICAL | Status: DC | PRN
  Administered 2018-10-02: 1000 mL

## 2018-10-02 MED ORDER — SODIUM CHLORIDE 0.9 % IV SOLN
INTRAVENOUS | Status: DC
  Administered 2018-10-02: 07:00:00 via INTRAVENOUS

## 2018-10-02 MED ORDER — CEFAZOLIN SODIUM-DEXTROSE 2-4 GM/100ML-% IV SOLN
2.0000 g | INTRAVENOUS | Status: AC
  Administered 2018-10-02: 2 g via INTRAVENOUS
  Filled 2018-10-02: qty 100

## 2018-10-02 MED ORDER — PHENYLEPHRINE 40 MCG/ML (10ML) SYRINGE FOR IV PUSH (FOR BLOOD PRESSURE SUPPORT)
PREFILLED_SYRINGE | INTRAVENOUS | Status: DC | PRN
  Administered 2018-10-02 (×2): 40 ug via INTRAVENOUS

## 2018-10-02 SURGICAL SUPPLY — 32 items
ARMBAND PINK RESTRICT EXTREMIT (MISCELLANEOUS) ×3 IMPLANT
CANISTER SUCT 3000ML PPV (MISCELLANEOUS) ×3 IMPLANT
CLIP VESOCCLUDE MED 6/CT (CLIP) ×3 IMPLANT
CLIP VESOCCLUDE SM WIDE 6/CT (CLIP) ×6 IMPLANT
COVER PROBE W GEL 5X96 (DRAPES) IMPLANT
COVER WAND RF STERILE (DRAPES) ×3 IMPLANT
DERMABOND ADVANCED (GAUZE/BANDAGES/DRESSINGS) ×2
DERMABOND ADVANCED .7 DNX12 (GAUZE/BANDAGES/DRESSINGS) ×1 IMPLANT
ELECT REM PT RETURN 9FT ADLT (ELECTROSURGICAL) ×3
ELECTRODE REM PT RTRN 9FT ADLT (ELECTROSURGICAL) ×1 IMPLANT
GLOVE BIO SURGEON STRL SZ7.5 (GLOVE) ×3 IMPLANT
GLOVE BIOGEL PI IND STRL 7.0 (GLOVE) ×1 IMPLANT
GLOVE BIOGEL PI IND STRL 7.5 (GLOVE) ×2 IMPLANT
GLOVE BIOGEL PI INDICATOR 7.0 (GLOVE) ×2
GLOVE BIOGEL PI INDICATOR 7.5 (GLOVE) ×4
GOWN STRL REUS W/ TWL LRG LVL3 (GOWN DISPOSABLE) ×2 IMPLANT
GOWN STRL REUS W/ TWL XL LVL3 (GOWN DISPOSABLE) ×2 IMPLANT
GOWN STRL REUS W/TWL LRG LVL3 (GOWN DISPOSABLE) ×4
GOWN STRL REUS W/TWL XL LVL3 (GOWN DISPOSABLE) ×4
INSERT FOGARTY SM (MISCELLANEOUS) IMPLANT
KIT BASIN OR (CUSTOM PROCEDURE TRAY) ×3 IMPLANT
KIT TURNOVER KIT B (KITS) ×3 IMPLANT
NS IRRIG 1000ML POUR BTL (IV SOLUTION) ×3 IMPLANT
PACK CV ACCESS (CUSTOM PROCEDURE TRAY) ×3 IMPLANT
PAD ARMBOARD 7.5X6 YLW CONV (MISCELLANEOUS) ×6 IMPLANT
SUT MNCRL AB 4-0 PS2 18 (SUTURE) ×3 IMPLANT
SUT PROLENE 6 0 BV (SUTURE) ×3 IMPLANT
SUT VIC AB 3-0 SH 27 (SUTURE) ×2
SUT VIC AB 3-0 SH 27X BRD (SUTURE) ×1 IMPLANT
TOWEL GREEN STERILE (TOWEL DISPOSABLE) ×3 IMPLANT
UNDERPAD 30X30 (UNDERPADS AND DIAPERS) ×3 IMPLANT
WATER STERILE IRR 1000ML POUR (IV SOLUTION) ×3 IMPLANT

## 2018-10-02 NOTE — Anesthesia Postprocedure Evaluation (Signed)
Anesthesia Post Note  Patient: DESERAY DAPONTE  Procedure(s) Performed: Creation of left arm Brachiocephalic Fistula (Left Arm Upper)     Patient location during evaluation: PACU Anesthesia Type: MAC Level of consciousness: awake and alert Pain management: pain level controlled Vital Signs Assessment: post-procedure vital signs reviewed and stable Respiratory status: spontaneous breathing, nonlabored ventilation and respiratory function stable Cardiovascular status: stable and blood pressure returned to baseline Postop Assessment: no apparent nausea or vomiting Anesthetic complications: no    Last Vitals:  Vitals:   10/02/18 0918 10/02/18 0945  BP: 97/63 109/76  Pulse: 91 88  Resp: 16 16  Temp: (!) 36.4 C   SpO2: 93% 95%    Last Pain:  Vitals:   10/02/18 0945  TempSrc:   PainSc: 6                  Charlei Ramsaran,W. EDMOND

## 2018-10-02 NOTE — Transfer of Care (Signed)
Immediate Anesthesia Transfer of Care Note  Patient: Tami [name]  Procedure(s) Performed: Creation of left arm Brachiocephalic Fistula (Left Arm Upper)  Patient Location: PACU  Anesthesia Type:MAC  Level of Consciousness: awake, alert , oriented and patient cooperative  Airway & Oxygen Therapy: Patient Spontanous Breathing and Patient connected to nasal cannula oxygen  Post-op Assessment: Report given to RN and Post -op Vital signs reviewed and stable  Post vital signs: Reviewed and stable  Last Vitals:  Vitals Value Taken Time  BP 105/76 10/02/2018  8:38 AM  Temp    Pulse 93 10/02/2018  8:40 AM  Resp 28 10/02/2018  8:40 AM  SpO2 100 % 10/02/2018  8:40 AM  Vitals shown include unvalidated device data.  Last Pain:  Vitals:   10/02/18 0640  TempSrc: Oral         Complications: No apparent anesthesia complications

## 2018-10-02 NOTE — Telephone Encounter (Signed)
-----   Message from Mena Goes, RN sent at 10/02/2018  9:20 AM EST ----- Regarding: 4-6 weeks with duplex   ----- Message ----- From: Iline Oven Sent: 10/02/2018   8:28 AM EST To: [facility] Admin Pool, [facility] Charge Pool   Can you schedule an appt for this pt in 4-6 weeks with fistula duplex on PA schedule.  PO L brachiocephalic fistula. Thanks, [facility]

## 2018-10-02 NOTE — H&P (Signed)
   History and Physical Update  The patient was interviewed and re-examined.  The patient's previous History and Physical has been reviewed and is unchanged from recent office visit. Plan for left arm avf vs graft today in OR.   Tami [name] C. Donzetta Matters, MD [facility] Office: [phone] Pager: [phone]   10/02/2018, 7:14 AM

## 2018-10-02 NOTE — Op Note (Signed)
    Patient name: Tami [name] MRN: [id number] DOB: December 14, 1955 Sex: female  10/02/2018 Pre-operative Diagnosis: End-stage renal disease Post-operative diagnosis:  Same Surgeon:  Erlene Quan C. Donzetta Matters, MD Assistant: Arlee Muslim, PA Procedure Performed:  Left arm brachiocephalic AV fistula creation  Indications: 62 year old female on dialysis via catheter since September of this year.  She is undergone vein mapping which demonstrates possible basilic and possible cephalic vein on the left arm which is her nondominant arm for fistula creation.  She now presents for permanent access.  Findings: Cephalic vein at the antecubital measured approximately 4 mm but was significantly smaller just under 3 mm in the upper arm.  I was able to dilate this to 3-1/2 mm using the entirety extent of the dilator and at completion she had a very strong thrill proximally I could trace this up the arm.  There was also palpable radial pulse at the wrist consistent with preoperative exam.  The basilic vein measured about 4 mm a few centimeters above the antecubitum but then there was significant branching.  If the cephalic vein fails basilic could be considered above the antecubitum for fistula creation.   Procedure:  The patient was identified in the holding area and taken to the operating room where she is placed supine operative and MAC anesthesia was induced.  She was to the prepped and draped in left upper extremity usual fashion, antibiotics were administered and a timeout was called.  We began using ultrasound to evaluate both her cephalic and basilic veins with the above-noted findings.  We made a transverse incision below the antecubitum identified her cephalic vein marked of orientation.  We then dissected down to the brachial artery which is noted to be free of disease.  Brachial vein branches were taken between clips and ties to expose the artery and a vessel loop was placed around the artery.  The vein was then divided  distally after clips were placed.  Was flushed with heparinized saline.  I then dilated serially up to 3-1/2 mm and flushed again and clamped it.  The artery was clamped distally and proximally opened longitudinally flushed with heparinized saline both directions.  The vein was then sewn end-to-side with 6-0 Prolene suture.  Prior to the completion we allowed flushing maneuvers in all directions.  Upon completion there was a strong thrill proximally I could trace this with a Doppler of the arm.  There was a radial signal and pulse at the wrist.  We irrigated the wound closed in layers with Vicryl and Monocryl.  Dermabond was placed at the level of the skin.  All counts were correct at completion.  She tolerated procedure well without immediate complication.  EBL: 10 cc    Wolfe Camarena C. Donzetta Matters, MD [facility] Office: [phone] Pager: [phone]

## 2018-10-02 NOTE — Telephone Encounter (Signed)
sch appt spk to pt mld ltr 11/14/2018 1pm Dialysis Duplex 2pm p/o PA

## 2018-10-02 NOTE — Discharge Instructions (Signed)
° °  [facility] ° °Discharge Instructions ° °AV Fistula or Graft Surgery for Dialysis Access ° °Please refer to the following instructions for your post-procedure care. Your surgeon or physician assistant will discuss any changes with you. ° °Activity ° °You may drive the day following your surgery, if you are comfortable and no longer taking prescription pain medication. Resume full activity as the soreness in your incision resolves. ° °Bathing/Showering ° °You may shower after you go home. Keep your incision dry for 48 hours. Do not soak in a bathtub, hot tub, or swim until the incision heals completely. You may not shower if you have a hemodialysis catheter. ° °Incision Care ° °Clean your incision with mild soap and water after 48 hours. Pat the area dry with a clean towel. You do not need a bandage unless otherwise instructed. Do not apply any ointments or creams to your incision. You may have skin glue on your incision. Do not peel it off. It will come off on its own in about one week. Your arm may swell a bit after surgery. To reduce swelling use pillows to elevate your arm so it is above your heart. Your doctor will tell you if you need to lightly wrap your arm with an ACE bandage. ° °Diet ° °Resume your normal diet. There are not special food restrictions following this procedure. In order to heal from your surgery, it is CRITICAL to get adequate nutrition. Your body requires vitamins, minerals, and protein. Vegetables are the best source of vitamins and minerals. Vegetables also provide the perfect balance of protein. Processed food has little nutritional value, so try to avoid this. ° °Medications ° °Resume taking all of your medications. If your incision is causing pain, you may take over-the counter pain relievers such as acetaminophen (Tylenol). If you were prescribed a stronger pain medication, please be aware these medications can cause nausea and constipation. Prevent  nausea by taking the medication with a snack or meal. Avoid constipation by drinking plenty of fluids and eating foods with high amount of fiber, such as fruits, vegetables, and grains. Do not take Tylenol if you are taking prescription pain medications. ° ° ° ° °Follow up °Your surgeon may want to see you in the office following your access surgery. If so, this will be arranged at the time of your surgery. ° °Please call us immediately for any of the following conditions: ° °Increased pain, redness, drainage (pus) from your incision site °Fever of 101 degrees or higher °Severe or worsening pain at your incision site °Hand pain or numbness. ° °Reduce your risk of vascular disease: ° °Stop smoking. If you would like help, call [facility] at 1-800-QUIT-NOW (1-800-784-8669) or [place] at 336-586-4000 ° °Manage your cholesterol °Maintain a desired weight °Control your diabetes °Keep your blood pressure down ° °Dialysis ° °It will take several weeks to several months for your new dialysis access to be ready for use. Your surgeon will determine when it is OK to use it. Your nephrologist will continue to direct your dialysis. You can continue to use your Permcath until your new access is ready for use. ° °If you have any questions, please call the office at [phone]. ° °

## 2018-10-03 ENCOUNTER — Inpatient Hospital Stay (HOSPITAL_BASED_OUTPATIENT_CLINIC_OR_DEPARTMENT_OTHER): Payer: Medicaid Other | Admitting: Family

## 2018-10-03 ENCOUNTER — Inpatient Hospital Stay (HOSPITAL_COMMUNITY): Payer: Medicaid Other

## 2018-10-03 ENCOUNTER — Encounter (HOSPITAL_COMMUNITY): Payer: Self-pay | Admitting: Vascular Surgery

## 2018-10-03 ENCOUNTER — Other Ambulatory Visit: Payer: Self-pay

## 2018-10-03 ENCOUNTER — Observation Stay (HOSPITAL_BASED_OUTPATIENT_CLINIC_OR_DEPARTMENT_OTHER)
Admission: EM | Admit: 2018-10-03 | Discharge: 2018-10-05 | Disposition: A | Discharge status: Home / Self Care | Payer: Medicaid Other | Attending: Internal Medicine | Admitting: Internal Medicine

## 2018-10-03 ENCOUNTER — Emergency Department (HOSPITAL_BASED_OUTPATIENT_CLINIC_OR_DEPARTMENT_OTHER): Payer: Medicaid Other

## 2018-10-03 ENCOUNTER — Inpatient Hospital Stay: Payer: Medicaid Other | Attending: Family

## 2018-10-03 ENCOUNTER — Encounter (HOSPITAL_BASED_OUTPATIENT_CLINIC_OR_DEPARTMENT_OTHER): Payer: Self-pay

## 2018-10-03 ENCOUNTER — Ambulatory Visit: Payer: Medicaid Other | Admitting: Family

## 2018-10-03 ENCOUNTER — Inpatient Hospital Stay: Payer: Medicaid Other

## 2018-10-03 DIAGNOSIS — I120 Hypertensive chronic kidney disease with stage 5 chronic kidney disease or end stage renal disease: Secondary | ICD-10-CM | POA: Diagnosis not present

## 2018-10-03 DIAGNOSIS — K754 Autoimmune hepatitis: Secondary | ICD-10-CM | POA: Insufficient documentation

## 2018-10-03 DIAGNOSIS — R918 Other nonspecific abnormal finding of lung field: Secondary | ICD-10-CM | POA: Diagnosis not present

## 2018-10-03 DIAGNOSIS — N189 Chronic kidney disease, unspecified: Secondary | ICD-10-CM | POA: Diagnosis present

## 2018-10-03 DIAGNOSIS — Z944 Liver transplant status: Secondary | ICD-10-CM

## 2018-10-03 DIAGNOSIS — E1122 Type 2 diabetes mellitus with diabetic chronic kidney disease: Secondary | ICD-10-CM | POA: Diagnosis present

## 2018-10-03 DIAGNOSIS — G9340 Encephalopathy, unspecified: Secondary | ICD-10-CM | POA: Diagnosis not present

## 2018-10-03 DIAGNOSIS — M1990 Unspecified osteoarthritis, unspecified site: Secondary | ICD-10-CM | POA: Diagnosis not present

## 2018-10-03 DIAGNOSIS — D631 Anemia in chronic kidney disease: Secondary | ICD-10-CM | POA: Diagnosis not present

## 2018-10-03 DIAGNOSIS — I6523 Occlusion and stenosis of bilateral carotid arteries: Secondary | ICD-10-CM | POA: Insufficient documentation

## 2018-10-03 DIAGNOSIS — I071 Rheumatic tricuspid insufficiency: Secondary | ICD-10-CM | POA: Insufficient documentation

## 2018-10-03 DIAGNOSIS — Z8673 Personal history of transient ischemic attack (TIA), and cerebral infarction without residual deficits: Secondary | ICD-10-CM | POA: Diagnosis not present

## 2018-10-03 DIAGNOSIS — J9811 Atelectasis: Secondary | ICD-10-CM | POA: Diagnosis not present

## 2018-10-03 DIAGNOSIS — I10 Essential (primary) hypertension: Secondary | ICD-10-CM | POA: Diagnosis not present

## 2018-10-03 DIAGNOSIS — M109 Gout, unspecified: Secondary | ICD-10-CM | POA: Insufficient documentation

## 2018-10-03 DIAGNOSIS — I4510 Unspecified right bundle-branch block: Secondary | ICD-10-CM | POA: Diagnosis not present

## 2018-10-03 DIAGNOSIS — Z9483 Pancreas transplant status: Secondary | ICD-10-CM | POA: Diagnosis not present

## 2018-10-03 DIAGNOSIS — R569 Unspecified convulsions: Principal | ICD-10-CM

## 2018-10-03 DIAGNOSIS — Z79899 Other long term (current) drug therapy: Secondary | ICD-10-CM | POA: Insufficient documentation

## 2018-10-03 DIAGNOSIS — Z992 Dependence on renal dialysis: Secondary | ICD-10-CM | POA: Diagnosis not present

## 2018-10-03 DIAGNOSIS — I469 Cardiac arrest, cause unspecified: Secondary | ICD-10-CM

## 2018-10-03 DIAGNOSIS — N186 End stage renal disease: Secondary | ICD-10-CM | POA: Diagnosis not present

## 2018-10-03 HISTORY — DX: End stage renal disease: N18.6

## 2018-10-03 HISTORY — DX: Dependence on renal dialysis: Z99.2

## 2018-10-03 HISTORY — DX: Unspecified convulsions: R56.9

## 2018-10-03 LAB — CBC WITH DIFFERENTIAL/PLATELET
Abs Immature Granulocytes: 0.04 10*3/uL (ref 0.00–0.07)
Basophils Absolute: 0 10*3/uL (ref 0.0–0.1)
Basophils Relative: 0 %
Eosinophils Absolute: 0.1 10*3/uL (ref 0.0–0.5)
Eosinophils Relative: 2 %
HCT: 30.8 % — ABNORMAL LOW (ref 36.0–46.0)
Hemoglobin: 9.4 g/dL — ABNORMAL LOW (ref 12.0–15.0)
Immature Granulocytes: 1 %
LYMPHS ABS: 1.2 10*3/uL (ref 0.7–4.0)
Lymphocytes Relative: 35 %
MCH: 31.8 pg (ref 26.0–34.0)
MCHC: 30.5 g/dL (ref 30.0–36.0)
MCV: 104.1 fL — ABNORMAL HIGH (ref 80.0–100.0)
Monocytes Absolute: 0.4 10*3/uL (ref 0.1–1.0)
Monocytes Relative: 12 %
Neutro Abs: 1.7 10*3/uL (ref 1.7–7.7)
Neutrophils Relative %: 50 %
PLATELETS: 145 10*3/uL — AB (ref 150–400)
RBC: 2.96 MIL/uL — AB (ref 3.87–5.11)
RDW: 15.4 % (ref 11.5–15.5)
WBC: 3.4 10*3/uL — ABNORMAL LOW (ref 4.0–10.5)
nRBC: 0 % (ref 0.0–0.2)

## 2018-10-03 LAB — TROPONIN I
Troponin I: <0.03 ng/mL (ref <0.03)
Troponin I: <0.03 ng/mL (ref <0.03)

## 2018-10-03 LAB — COMPREHENSIVE METABOLIC PANEL
AST: 24 U/L (ref 15–41)
Albumin: 2.4 g/dL — ABNORMAL LOW (ref 3.5–5.0)
Alkaline Phosphatase: 74 U/L (ref 38–126)
Anion gap: 14 (ref 5–15)
BUN: 30 mg/dL — ABNORMAL HIGH (ref 8–23)
CO2: 22 mmol/L (ref 22–32)
Calcium: 7.6 mg/dL — ABNORMAL LOW (ref 8.9–10.3)
Chloride: 101 mmol/L (ref 98–111)
Creatinine, Ser: 5.14 mg/dL — ABNORMAL HIGH (ref 0.44–1.00)
GFR calc non Af Amer: 8 mL/min — ABNORMAL LOW (ref >60)
GFR, EST AFRICAN AMERICAN: 10 mL/min — AB (ref >60)
Glucose, Bld: 118 mg/dL — ABNORMAL HIGH (ref 70–99)
Potassium: 4 mmol/L (ref 3.5–5.1)
Sodium: 137 mmol/L (ref 135–145)
Total Bilirubin: 0.5 mg/dL (ref 0.3–1.2)
Total Protein: 7.4 g/dL (ref 6.5–8.1)

## 2018-10-03 LAB — LIPID PANEL
Cholesterol: 124 mg/dL (ref 0–200)
HDL: 23 mg/dL — ABNORMAL LOW (ref >40)
LDL CALC: 73 mg/dL (ref 0–99)
Total CHOL/HDL Ratio: 5.4 RATIO
Triglycerides: 138 mg/dL (ref <150)
VLDL: 28 mg/dL (ref 0–40)

## 2018-10-03 LAB — I-STAT ARTERIAL BLOOD GAS, ED
Acid-base deficit: 2 mmol/L (ref 0.0–2.0)
Bicarbonate: 24.3 mmol/L (ref 20.0–28.0)
O2 Saturation: 98 %
Patient temperature: 98.5
TCO2: 26 mmol/L (ref 22–32)
pCO2 arterial: 47.7 mmHg (ref 32.0–48.0)
pH, Arterial: 7.315 — ABNORMAL LOW (ref 7.350–7.450)
pO2, Arterial: 106 mmHg (ref 83.0–108.0)

## 2018-10-03 LAB — CBC WITH DIFFERENTIAL (CANCER CENTER ONLY)
Abs Immature Granulocytes: 0 10*3/uL (ref 0.00–0.07)
BASOS ABS: 0 10*3/uL (ref 0.0–0.1)
Basophils Relative: 0 %
Eosinophils Absolute: 0.1 10*3/uL (ref 0.0–0.5)
Eosinophils Relative: 3 %
HEMATOCRIT: 31.3 % — AB (ref 36.0–46.0)
HEMOGLOBIN: 9.6 g/dL — AB (ref 12.0–15.0)
Immature Granulocytes: 0 %
LYMPHS ABS: 1 10*3/uL (ref 0.7–4.0)
Lymphocytes Relative: 29 %
MCH: 31.8 pg (ref 26.0–34.0)
MCHC: 30.7 g/dL (ref 30.0–36.0)
MCV: 103.6 fL — ABNORMAL HIGH (ref 80.0–100.0)
MONOS PCT: 12 %
Monocytes Absolute: 0.4 10*3/uL (ref 0.1–1.0)
Neutro Abs: 1.8 10*3/uL (ref 1.7–7.7)
Neutrophils Relative %: 56 %
Platelet Count: 166 10*3/uL (ref 150–400)
RBC: 3.02 MIL/uL — ABNORMAL LOW (ref 3.87–5.11)
RDW: 15.4 % (ref 11.5–15.5)
WBC Count: 3.3 10*3/uL — ABNORMAL LOW (ref 4.0–10.5)
nRBC: 0 % (ref 0.0–0.2)

## 2018-10-03 LAB — CMP (CANCER CENTER ONLY)
ALT: 1 U/L (ref 0–44)
ANION GAP: 11 (ref 5–15)
AST: 13 U/L — ABNORMAL LOW (ref 15–41)
Albumin: 3 g/dL — ABNORMAL LOW (ref 3.5–5.0)
Alkaline Phosphatase: 78 U/L (ref 38–126)
BUN: 29 mg/dL — ABNORMAL HIGH (ref 8–23)
CO2: 28 mmol/L (ref 22–32)
Calcium: 7.8 mg/dL — ABNORMAL LOW (ref 8.9–10.3)
Chloride: 97 mmol/L — ABNORMAL LOW (ref 98–111)
Creatinine: 5.16 mg/dL (ref 0.44–1.00)
GFR, Est AFR Am: 10 mL/min — ABNORMAL LOW (ref >60)
GFR, Estimated: 8 mL/min — ABNORMAL LOW (ref >60)
Glucose, Bld: 91 mg/dL (ref 70–99)
Potassium: 3.9 mmol/L (ref 3.5–5.1)
Sodium: 136 mmol/L (ref 135–145)
Total Bilirubin: 0.4 mg/dL (ref 0.3–1.2)
Total Protein: 7.3 g/dL (ref 6.5–8.1)

## 2018-10-03 LAB — I-STAT CG4 LACTIC ACID, ED: LACTIC ACID, VENOUS: 4.75 mmol/L — AB (ref 0.5–1.9)

## 2018-10-03 LAB — PROTIME-INR
INR: 1.17
Prothrombin Time: 14.8 seconds (ref 11.4–15.2)

## 2018-10-03 LAB — CBG MONITORING, ED: GLUCOSE-CAPILLARY: 105 mg/dL — AB (ref 70–99)

## 2018-10-03 LAB — APTT: aPTT: 31 seconds (ref 24–36)

## 2018-10-03 MED ORDER — ONDANSETRON HCL 4 MG/2ML IJ SOLN: 4.0000 mg | Freq: Four times a day (QID) | INTRAMUSCULAR | Status: DC | PRN

## 2018-10-03 MED ORDER — ALBUTEROL SULFATE (2.5 MG/3ML) 0.083% IN NEBU: 2.5000 mg | INHALATION_SOLUTION | Freq: Four times a day (QID) | RESPIRATORY_TRACT | Status: DC | PRN

## 2018-10-03 MED ORDER — ONDANSETRON HCL 4 MG PO TABS: 4.0000 mg | ORAL_TABLET | Freq: Four times a day (QID) | ORAL | Status: DC | PRN

## 2018-10-03 MED ORDER — LORAZEPAM 2 MG/ML IJ SOLN
INTRAMUSCULAR | Status: AC
  Filled 2018-10-03: qty 1

## 2018-10-03 MED ORDER — ACETAMINOPHEN 325 MG PO TABS: 650.0000 mg | ORAL_TABLET | Freq: Four times a day (QID) | ORAL | Status: DC | PRN

## 2018-10-03 MED ORDER — LABETALOL HCL 5 MG/ML IV SOLN: 10.0000 mg | INTRAVENOUS | Status: DC | PRN

## 2018-10-03 MED ORDER — ACETAMINOPHEN 650 MG RE SUPP: 650.0000 mg | Freq: Four times a day (QID) | RECTAL | Status: DC | PRN

## 2018-10-03 MED ORDER — SODIUM CHLORIDE 0.9 % IV SOLN
INTRAVENOUS | Status: DC
  Administered 2018-10-03 (×2): via INTRAVENOUS

## 2018-10-03 MED ORDER — LEVETIRACETAM IN NACL 1000 MG/100ML IV SOLN
1000.0000 mg | Freq: Once | INTRAVENOUS | Status: AC
  Administered 2018-10-03: 1000 mg via INTRAVENOUS
  Filled 2018-10-03: qty 100

## 2018-10-03 MED ORDER — LORAZEPAM 1 MG PO TABS
0.5000 mg | ORAL_TABLET | Freq: Once | ORAL | Status: DC
  Filled 2018-10-03: qty 1

## 2018-10-03 MED ORDER — HEPARIN SOD (PORK) LOCK FLUSH 100 UNIT/ML IV SOLN
INTRAVENOUS | Status: AC
  Administered 2018-10-03: 500 [IU]
  Filled 2018-10-03: qty 5

## 2018-10-03 MED ORDER — HEPARIN SODIUM (PORCINE) 5000 UNIT/ML IJ SOLN
5000.0000 [IU] | Freq: Three times a day (TID) | INTRAMUSCULAR | Status: DC
  Administered 2018-10-04 – 2018-10-05 (×4): 5000 [IU] via SUBCUTANEOUS
  Filled 2018-10-03 (×4): qty 1

## 2018-10-03 MED ORDER — LORAZEPAM 2 MG/ML IJ SOLN
1.0000 mg | Freq: Once | INTRAMUSCULAR | Status: AC
  Administered 2018-10-03: 1 mg via INTRAVENOUS

## 2018-10-03 MED ORDER — CYCLOSPORINE MODIFIED (NEORAL) 25 MG PO CAPS
50.0000 mg | ORAL_CAPSULE | Freq: Two times a day (BID) | ORAL | Status: DC
  Administered 2018-10-04 – 2018-10-05 (×3): 50 mg via ORAL
  Filled 2018-10-03 (×12): qty 2

## 2018-10-03 NOTE — Progress Notes (Signed)
Admission from [facility] 62 y/o F with ESRD on HD, s/p liver txp on chronic immunosuppression who was at her nephrology appt today and had seizure-like episode, lost pulse for 2 minutes, regained pulse after 2 min CPR (no drugs or defib given). Had bowel incontinence, was postictal. In ED had another seizure like episode, was rigid, R gaze deviation, desatted to mid 80s. Given ativan, keppra. CT head, CXR normal. No h/o seizure. No fever. Lactate 4.75. WBC 3.4 (was 2.7). TnI neg. ED there spoke with [facility] who will see pt when pt arrives here (please call him). Pt will need MRI, EEG.

## 2018-10-03 NOTE — H&P (Signed)
History and Physical    Tami [name] DZH:[id number] DOB: 06/05/1956 DOA: 10/03/2018  PCP: [facility] [place]  Patient coming from: [facility].  Chief Complaint: Seizure and cardiac arrest.  HPI: Tami [name] is a 61 y.o. 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  As also was concern for some cardiac arrest for which patient was given CPR as per the report.  No medications were given at that time.  Patient was transferred to [place].  ED Course: In the ER patient was alert awake oriented x4 initially later had another episode of generalized tonic-clonic seizure with right sided gaze preference.  CT head was unremarkable patient was given Ativan and Keppra loading dose and neurologist on-call was consulted.  Requested admission.  Troponin was negative EKG was showing normal sinus rhythm with RBBB chest x-ray was unremarkable.  At the time of my exam patient is still postictal but arousable when calling her name.  Moves all extremities.  Review of Systems: As per HPI, rest all negative.   Past Medical History:  Diagnosis Date  . Anemia   . Arthritis   . Chronic kidney disease    vas cath/M-W-F dialysis  . Gout   . Hepatitis, autoimmune ([place]) 12/08/2011  . Hypertension     Past Surgical History:  Procedure Laterality Date  . ABDOMINAL HYSTERECTOMY    . AV FISTULA PLACEMENT Left 10/02/2018   Procedure: Creation of left arm Brachiocephalic Fistula;  Surgeon: Waynetta Sandy, MD;  Location: [facility];  Service: Vascular;  Laterality: Left;  . LIVER TRANSPLANT     1996  . TONSILLECTOMY       reports that she has never smoked. She has never used smokeless tobacco. She reports that she drank alcohol. She reports that she does not use drugs.  Allergies  Allergen Reactions  .  Iodinated Diagnostic Agents     UNSPECIFIED REACTION   . Ioxaglate     UNSPECIFIED REACTION   . Latex     UNSPECIFIED REACTION     Family History  Problem Relation Age of Onset  . Stroke Mother   . Cancer Father     Prior to Admission medications   Medication Sig Start Date End Date Taking? Authorizing Provider  albuterol (PROVENTIL HFA;VENTOLIN HFA) 108 (90 Base) MCG/ACT inhaler Inhale 2 puffs into the lungs every 6 (six) hours as needed.  07/20/18  Yes [provider]  alendronate (FOSAMAX) 70 MG tablet Take 70 mg by mouth once a week. Give on Monday's 07/23/18  Yes [provider]  allopurinol (ZYLOPRIM) 100 MG tablet Take 100 mg by mouth daily. 03/18/15  Yes [provider]  cycloSPORINE modified (NEORAL) 25 MG capsule Take 50 mg by mouth 2 (two) times daily.    Yes [provider]  ENSURE (ENSURE) Give 120 cc by mouth two times daily 07/30/18  Yes [provider]  labetalol (NORMODYNE) 100 MG tablet Take 100 mg by mouth 2 (two) times daily. 07/20/18  Yes [provider]  ondansetron (ZOFRAN) 4 MG tablet Take 4 mg by mouth every 6 (six) hours as needed for nausea or vomiting. 07/25/18  Yes [provider]  Oxycodone HCl 10 MG TABS Take 1 tablet (10 mg total) by mouth every 6 (six) hours as needed for up to 10 doses (severe pain). -  hold for sedation 10/02/18  Yes Dagoberto Ligas, PA-C  Pancrelipase, Lip-Prot-Amyl, 24000-76000 units CPEP Give 1 capsule by mouth with meals 07/20/18  Yes [provider]  pantoprazole (PROTONIX) 40 MG tablet Take 40 mg by mouth 2 (two) times daily. 08/01/18  Yes [provider]  loratadine (CLARITIN) 10 MG tablet Take 10 mg by mouth daily. X 10 days 08/11/18 08/21/18  [provider]  zolpidem (AMBIEN) 5 MG tablet Take 1 tablet (5 mg total) by mouth at bedtime. Patient taking differently: Take 5 mg by mouth at bedtime as needed for sleep.  07/31/18   Gerlene Fee, NP     Physical Exam: Vitals:   10/03/18 2000 10/03/18 2015 10/03/18 2031 10/03/18 2100  BP: 108/68 113/74  97/67  Pulse: 91 93  89  Resp: 20 15  19   Temp:      TempSrc:      SpO2: 98% 97%  99%  Weight:   63.1 kg   Height:   5\' 1"  (1.549 m)       Constitutional: Moderately built and nourished. Vitals:   10/03/18 2000 10/03/18 2015 10/03/18 2031 10/03/18 2100  BP: 108/68 113/74  97/67  Pulse: 91 93  89  Resp: 20 15  19   Temp:      TempSrc:      SpO2: 98% 97%  99%  Weight:   63.1 kg   Height:   5\' 1"  (1.549 m)    Eyes: Anicteric no pallor. ENMT: No discharge from the ears eyes nose or mouth. Neck: No mass felt.  No neck rigidity but no JVD appreciated. Respiratory: No rhonchi or crepitations. Cardiovascular: S1-S2 heard no murmurs appreciated. Abdomen: Soft nontender bowel sounds present. Musculoskeletal: No edema.  No joint effusion. Skin: No rash. Neurologic: Lethargic arousable pupils are equal and reacting to light moves all extremities. Psychiatric: Lethargic.   Labs on Admission: I have personally reviewed following labs and imaging studies  CBC: Recent Labs  Lab 10/02/18 0622 10/03/18 1338 10/03/18 1433  WBC  --  3.3* 3.4*  NEUTROABS  --  1.8 1.7  HGB 11.6* 9.6* 9.4*  HCT 34.0* 31.3* 30.8*  MCV  --  103.6* 104.1*  PLT  --  166 295*   Basic Metabolic Panel: Recent Labs  Lab 10/02/18 0622 10/03/18 1338 10/03/18 1433  NA 139 136 137  K 3.7 3.9 4.0  CL  --  97* 101  CO2  --  28 22  GLUCOSE 94 91 118*  BUN  --  29* 30*  CREATININE  --  5.16* 5.14*  CALCIUM  --  7.8* 7.6*   GFR: Estimated Creatinine Clearance: 9.5 mL/min (A) (by C-G formula based on SCr of 5.14 mg/dL (H)). Liver Function Tests: Recent Labs  Lab 10/03/18 1338 10/03/18 1433  AST 13* 24  ALT 1 <5  ALKPHOS 78 74  BILITOT 0.4 0.5  PROT 7.3 7.4  ALBUMIN 3.0* 2.4*   No results for input(s): LIPASE, AMYLASE in the last 168 hours. No results for input(s): AMMONIA in the last  168 hours. Coagulation Profile: Recent Labs  Lab 10/03/18 1433  INR 1.17   Cardiac Enzymes: Recent Labs  Lab 10/03/18 1433 10/03/18 1701  TROPONINI <0.03 <0.03   BNP (last 3 results) No results for input(s): PROBNP in the last 8760 hours. HbA1C: No results for input(s): HGBA1C in the last 72 hours. CBG: Recent Labs  Lab 10/02/18 0843 10/03/18 1511  GLUCAP 81 105*   Lipid Profile: Recent Labs  10/03/18 1433  CHOL 124  HDL 23*  LDLCALC 73  TRIG 138  CHOLHDL 5.4   Thyroid Function Tests: No results for input(s): TSH, T4TOTAL, FREET4, T3FREE, THYROIDAB in the last 72 hours. Anemia Panel: No results for input(s): VITAMINB12, FOLATE, FERRITIN, TIBC, IRON, RETICCTPCT in the last 72 hours. Urine analysis:    Component Value Date/Time   COLORURINE YELLOW 10/13/2017 1610   APPEARANCEUR HAZY (A) 10/13/2017 1610   LABSPEC 1.008 10/13/2017 1610   PHURINE 5.0 10/13/2017 1610   GLUCOSEU NEGATIVE 10/13/2017 1610   HGBUR NEGATIVE 10/13/2017 1610   BILIRUBINUR NEGATIVE 10/13/2017 1610   KETONESUR NEGATIVE 10/13/2017 1610   PROTEINUR NEGATIVE 10/13/2017 1610   NITRITE NEGATIVE 10/13/2017 1610   LEUKOCYTESUR SMALL (A) 10/13/2017 1610   Sepsis Labs: @LABRCNTIP (procalcitonin:4,lacticidven:4) )No results found for this or any previous visit (from the past 240 hour(s)).   Radiological Exams on Admission: Ct Head Wo Contrast  Result Date: 10/03/2018 CLINICAL DATA:  Seizure EXAM: CT HEAD WITHOUT CONTRAST TECHNIQUE: Contiguous axial images were obtained from the base of the skull through the vertex without intravenous contrast. COMPARISON:  None. FINDINGS: Brain: There is no mass, hemorrhage or extra-axial collection. The size and configuration of the ventricles and extra-axial CSF spaces are normal. There is hypoattenuation of the periventricular white matter, most commonly indicating chronic ischemic microangiopathy. Vascular: No abnormal hyperdensity of the major intracranial  arteries or dural venous sinuses. No intracranial atherosclerosis. Skull: The visualized skull base, calvarium and extracranial soft tissues are normal. Sinuses/Orbits: No fluid levels or advanced mucosal thickening of the visualized paranasal sinuses. No mastoid or middle ear effusion. The orbits are normal. IMPRESSION: Mild chronic small vessel disease without acute intracranial abnormality. Electronically Signed   By: Ulyses Jarred M.D.   On: 10/03/2018 15:10   Mr Jodene Nam Head Wo Contrast  Result Date: 10/03/2018 CLINICAL DATA:  Seizure activity, 2 minutes episode of cardiac arrest. No history of seizures. History of autoimmune hepatitis, status post liver transplant. EXAM: MRI HEAD WITHOUT CONTRAST MRA HEAD WITHOUT CONTRAST TECHNIQUE: Multiplanar, multiecho pulse sequences of the brain and surrounding structures were obtained without intravenous contrast. Angiographic images of the head were obtained using MRA technique without contrast. COMPARISON:  CT HEAD October 03, 2018 FINDINGS: MRI HEAD FINDINGS INTRACRANIAL CONTENTS: No reduced diffusion to suggest acute ischemia. No susceptibility artifact to suggest hemorrhage. The ventricles and sulci are normal for patient's age. Scattered subcentimeter supratentorial white matter FLAIR T2 hyperintensities. Hazy T2 hyperintense signal within the pons. 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 Mildly prominent basal ganglia perivascular spaces associated with chronic small vessel ischemic changes. No suspicious parenchymal signal, masses, mass effect. No abnormal extra-axial fluid collections. No extra-axial masses. Normal symmetric hippocampal size, morphology and signal. VASCULAR: Normal major intracranial vascular flow voids present at skull base. SKULL AND UPPER CERVICAL SPINE: No abnormal sellar expansion. No suspicious calvarial bone marrow signal. Severe LEFT temporomandibular osteoarthrosis. Craniocervical junction maintained. SINUSES/ORBITS: The mastoid  air-cells and included paranasal sinuses are well-aerated.The included ocular globes and orbital contents are non-suspicious. Status post bilateral ocular lens implants. OTHER: Patient is edentulous. MRA HEAD FINDINGS ANTERIOR CIRCULATION: Flow related enhancement of the included cervical, petrous, cavernous and supraclinoid internal carotid arteries. Luminal irregularity bilateral internal carotid arteries corresponding to calcific atherosclerosis on today's CT; moderate RIGHT and severe LEFT paraclinoid stenosis. Patent anterior communicating artery. Patent anterior and middle cerebral arteries. No large vessel occlusion, aneurysm. POSTERIOR CIRCULATION: Codominant vertebral arteries. Vertebrobasilar arteries are patent, with normal flow related enhancement of the main  branch vessels. Patent posterior cerebral arteries. Small LEFT posterior communicating artery present. No large vessel occlusion, flow limiting stenosis,  aneurysm. ANATOMIC VARIANTS: None. Source images and MIP images were reviewed. IMPRESSION: MRI HEAD: 1. No acute intracranial process. 2. Mild chronic small vessel ischemic changes. Old RIGHT basal ganglia lacunar infarct. MRA HEAD: 1. No emergent large vessel occlusion. 2. Severe LEFT and moderate RIGHT ICA stenosis. Electronically Signed   By: Elon Alas M.D.   On: 10/03/2018 22:52   Mr Brain Wo Contrast  Result Date: 10/03/2018 CLINICAL DATA:  Seizure activity, 2 minutes episode of cardiac arrest. No history of seizures. History of autoimmune hepatitis, status post liver transplant. EXAM: MRI HEAD WITHOUT CONTRAST MRA HEAD WITHOUT CONTRAST TECHNIQUE: Multiplanar, multiecho pulse sequences of the brain and surrounding structures were obtained without intravenous contrast. Angiographic images of the head were obtained using MRA technique without contrast. COMPARISON:  CT HEAD October 03, 2018 FINDINGS: MRI HEAD FINDINGS INTRACRANIAL CONTENTS: No reduced diffusion to suggest acute  ischemia. No susceptibility artifact to suggest hemorrhage. The ventricles and sulci are normal for patient's age. Scattered subcentimeter supratentorial white matter FLAIR T2 hyperintensities. Hazy T2 hyperintense signal within the pons. 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 Mildly prominent basal ganglia perivascular spaces associated with chronic small vessel ischemic changes. No suspicious parenchymal signal, masses, mass effect. No abnormal extra-axial fluid collections. No extra-axial masses. Normal symmetric hippocampal size, morphology and signal. VASCULAR: Normal major intracranial vascular flow voids present at skull base. SKULL AND UPPER CERVICAL SPINE: No abnormal sellar expansion. No suspicious calvarial bone marrow signal. Severe LEFT temporomandibular osteoarthrosis. Craniocervical junction maintained. SINUSES/ORBITS: The mastoid air-cells and included paranasal sinuses are well-aerated.The included ocular globes and orbital contents are non-suspicious. Status post bilateral ocular lens implants. OTHER: Patient is edentulous. MRA HEAD FINDINGS ANTERIOR CIRCULATION: Flow related enhancement of the included cervical, petrous, cavernous and supraclinoid internal carotid arteries. Luminal irregularity bilateral internal carotid arteries corresponding to calcific atherosclerosis on today's CT; moderate RIGHT and severe LEFT paraclinoid stenosis. Patent anterior communicating artery. Patent anterior and middle cerebral arteries. No large vessel occlusion, aneurysm. POSTERIOR CIRCULATION: Codominant vertebral arteries. Vertebrobasilar arteries are patent, with normal flow related enhancement of the main branch vessels. Patent posterior cerebral arteries. Small LEFT posterior communicating artery present. No large vessel occlusion, flow limiting stenosis,  aneurysm. ANATOMIC VARIANTS: None. Source images and MIP images were reviewed. IMPRESSION: MRI HEAD: 1. No acute intracranial process. 2. Mild chronic  small vessel ischemic changes. Old RIGHT basal ganglia lacunar infarct. MRA HEAD: 1. No emergent large vessel occlusion. 2. Severe LEFT and moderate RIGHT ICA stenosis. Electronically Signed   By: Elon Alas M.D.   On: 10/03/2018 22:52   Dg Chest Port 1 View  Result Date: 10/03/2018 CLINICAL DATA:  Cardiac arrest. EXAM: PORTABLE CHEST 1 VIEW COMPARISON:  Chest x-ray dated July 06, 2018. FINDINGS: Unchanged tunneled right internal jugular dialysis catheter with the tip in the proximal right atrium. Stable cardiomegaly. Persistent low lung volumes with bibasilar atelectasis and bronchovascular crowding. Slightly coarsened interstitial markings are similar to prior study. No focal consolidation, pleural effusion, or pneumothorax. No acute osseous abnormality. IMPRESSION: Stable chest. Poor inspiration with bibasilar atelectasis. No active disease. Electronically Signed   By: Titus Dubin M.D.   On: 10/03/2018 15:08    EKG: Independently reviewed.  Normal sinus rhythm with RBBB.  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 Possible seizures with possible cardiac arrest requiring CPR briefly -we will monitor and stepdown.  Cycle cardiac markers check 2D echo.  Monitor electrolytes.  Discussed with neurologist on-call Dr. Leonel Ramsay at this time advised not to start patient on any anti-epileptics until we get EEG.  MRI of the brain and MRI of the brain did not show anything acute. 2. Encephalopathy likely postictal.  Follow closely in the stepdown. 3. Hypertension we will keep patient on PRN IV labetalol for now until patient is more alert awake and able to swallow her medications. 4. History of liver transplant  and pancreatic insufficiency will continue with patient's cyclosporine. 5. Anemia likely from ESRD follow CBC. 6. ESRD on hemodialysis Monday Wednesday and Friday will need to consult nephrology for dialysis.  At this time does not look fluid overloaded.   DVT prophylaxis: Heparin. Code Status: Full code. Family Communication: No family at the bedside. Disposition Plan: To be determined. Consults called: Neurology. Admission status: Inpatient.   Rise Patience MD [facility] Pager [phone].  If 7PM-7AM, please contact night-coverage [email or web address] Password [facility]  10/03/2018, 10:58 PM

## 2018-10-03 NOTE — ED Triage Notes (Signed)
Pt upstairs for routine visit, ED staff responded to possible seizure-like activity with loss of pulse for approx 2 min. Pt placed on zoll monitor and transported to ED. EDP, charge RN, RN, EMT responded to event. Pt alert to self. Airway intact. VSS at this time.

## 2018-10-03 NOTE — ED Provider Notes (Signed)
[facility] EMERGENCY DEPARTMENT Provider Note   CSN: [id number] Arrival date & time: 10/03/18  1420     History   Chief Complaint Chief Complaint  Patient presents with  . Seizures    HPI Tami [name] is a 62 y.o. female presenting for evaluation of sz and possible cardiac arrest.   Level V caveat, pt altered.   Pt initially evaluated at cancer center clinic room prior to being transported by stretcher to the ED.   Per clinic staff, pt was at the cancer center for f/u of ESRD. She had whole body jerking/twtiching.  And had a episode of shakes that were consistent with tonic-clonic movements.  Patient became unresponsive and had urinary incontinence.  Was unable to find a pulse, and 2 minutes of compressions was initiated, and pt regained pulse and consciousness.    Additional history obtained from chart review.  Patient with a history of ESRD on dialysis.  Goes Monday, Wednesday, Friday.  Per staff, patient did have dialysis today. Additionally, patient with a history of liver transplant on immunosuppression.  Per chart, patient is not on blood thinners.  Per brother, patient had this jerking/twitching motion this morning, caused her to fall but he caught her before she hit her head.  No history of similar jerking.  Patient does not have a history of seizures.  HPI  Past Medical History:  Diagnosis Date  . Anemia   . Arthritis   . Chronic kidney disease    vas cath/M-W-F dialysis  . Gout   . Hepatitis, autoimmune ([place]) 12/08/2011  . Hypertension     Patient Active Problem List   Diagnosis Date Noted  . Seizure ([place]) 10/03/2018  . Allergic rhinitis 08/20/2018  . Chronic superficial gastritis without bleeding 08/11/2018  . Dialysis patient ([place]) 08/11/2018  . PEG (percutaneous endoscopic gastrostomy) status ([place]) 08/11/2018  . Essential hypertension, benign 08/04/2018  . Post-menopausal osteoporosis 08/04/2018  . Chronic respiratory failure ([place])  08/04/2018  . End stage renal disease on dialysis due to type 2 diabetes mellitus ([place]) 08/04/2018  . Immunosuppression ([place]) 08/04/2018  . Chronic gout due to renal impairment 08/04/2018  . Pancreatic insufficiency 08/04/2018  . DDD (degenerative disc disease), lumbosacral 01/02/2014  . Type 2 diabetes mellitus with end-stage renal disease ([place]) 01/02/2014  . Thoracic and lumbosacral neuritis 01/02/2014  . Osteoarthritis of right knee 09/13/2013  . Chronic low back pain 09/13/2013  . Arthritis of knee, degenerative 09/13/2013  . Depression with anxiety 03/30/2013  . HLD (hyperlipidemia) 03/30/2013  . 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 H/O liver transplant ([place]) 03/30/2013  . Autoimmune hepatitis ([place]) 12/08/2011  . Anemia of renal disease 09/28/2011  . Pancytopenia ([place]) 09/28/2011  . Bone marrow failure ([place]) 09/28/2011    Past Surgical History:  Procedure Laterality Date  . ABDOMINAL HYSTERECTOMY    . AV FISTULA PLACEMENT Left 10/02/2018   Procedure: Creation of left arm Brachiocephalic Fistula;  Surgeon: Waynetta Sandy, MD;  Location: [facility];  Service: Vascular;  Laterality: Left;  . LIVER TRANSPLANT     1996  . TONSILLECTOMY       OB History   None      Home Medications    Prior to Admission medications   Medication Sig Start Date End Date Taking? Authorizing Provider  albuterol (PROVENTIL HFA;VENTOLIN HFA) 108 (90 Base) MCG/ACT inhaler Inhale 2 puffs into the lungs every 6 (six) hours as needed.  07/20/18   [provider]  alendronate (FOSAMAX) 70 MG  tablet Take 70 mg by mouth once a week. Give on Monday's 07/23/18   [provider]  allopurinol (ZYLOPRIM) 100 MG tablet Take 100 mg by mouth daily. 03/18/15   [provider]  cycloSPORINE modified (NEORAL) 25 MG capsule Take 50 mg by mouth 2 (two) times daily.     [provider]  DM-Benzocaine-Menthol (CHLORASEPTIC TOTAL) 02-26-09 MG LOZG Use as directed 1 lozenge in the  mouth or throat every 2 (two) hours as needed.    [provider]  ENSURE (ENSURE) Give 120 cc by mouth two times daily 07/30/18   [provider]  Ferric Citrate (AURYXIA PO) Give 2 tablets by mouth before meals for supplement 08/14/18   [provider]  labetalol (NORMODYNE) 100 MG tablet Take 100 mg by mouth 2 (two) times daily. 07/20/18   [provider]  Lidocaine 4 % PTCH Apply to skin topically in the morning for pain, remove in the evening    [provider]  loratadine (CLARITIN) 10 MG tablet Take 10 mg by mouth daily. X 10 days 08/11/18 08/21/18  [provider]  Melatonin 3 MG TABS Give 2 tablets by mouth at bedtime for insomnia 07/20/18   [provider]  Nutritional Supplements (NUTRITIONAL SUPPLEMENT PO) Regular Diet - Regular Texture    [provider]  ondansetron (ZOFRAN) 4 MG tablet Take 4 mg by mouth every 6 (six) hours as needed for nausea or vomiting. 07/25/18   [provider]  Oxycodone HCl 10 MG TABS Take 1 tablet (10 mg total) by mouth every 6 (six) hours as needed for up to 10 doses (severe pain). - hold for sedation 10/02/18   Dagoberto Ligas, PA-C  Pancrelipase, Lip-Prot-Amyl, 24000-76000 units CPEP Give 1 capsule by mouth with meals 07/20/18   [provider]  pantoprazole (PROTONIX) 40 MG tablet Take 40 mg by mouth 2 (two) times daily. 08/01/18   [provider]  zolpidem (AMBIEN) 5 MG tablet Take 1 tablet (5 mg total) by mouth at bedtime. 07/31/18   Gerlene Fee, NP    Family History Family History  Problem Relation Age of Onset  . Stroke Mother   . Cancer Father     Social History Social History   Tobacco Use  . Smoking status: Never Smoker  . Smokeless tobacco: Never Used  . Tobacco comment: never used tobacco  Substance Use Topics  . Alcohol use: Not Currently    Alcohol/week: 0.0 standard drinks  . Drug use: No     Allergies   Iodinated diagnostic  agents; Ioxaglate; and Latex   Review of Systems Review of Systems  Unable to perform ROS: Mental status change  Allergic/Immunologic: Positive for immunocompromised state.  Neurological: Positive for seizures.     Physical Exam Updated Vital Signs BP 100/67   Pulse 92   Temp 98.5 F (36.9 C) (Rectal)   Resp 20   SpO2 100%   Physical Exam  Constitutional: She appears well-developed and well-nourished. No distress.  Chronically ill-appearing female, confused  HENT:  Head: Normocephalic and atraumatic.  MM dry  Eyes: Pupils are equal, round, and reactive to light. Conjunctivae and EOM are normal.  EOMI and PERRLA. No nystagmus  Neck: Normal range of motion. Neck supple.  Cardiovascular: Regular rhythm and intact distal pulses.  tachycardic ~105  Pulmonary/Chest: Effort normal and breath sounds normal. No respiratory distress. She has no wheezes.  Clear lung sounds in all fields. Speaking in full sentences  Abdominal: Soft.  She exhibits no distension. There is no tenderness.  Musculoskeletal: Normal range of motion.  Neurological: She is alert. She has normal strength and normal reflexes. No cranial nerve deficit or sensory deficit. GCS eye subscore is 4. GCS verbal subscore is 5. GCS motor subscore is 6.  Pt is confused/post-ictal. Repeatedly asking where she is. Cannot remember going to dialysis today. Alert to person.  No other neurologic deficits noted. CN intact. Strength intact x4. Sensation intact x4. No facial droop.   Skin: Skin is warm and dry. Capillary refill takes less than 2 seconds.  Psychiatric: She has a normal mood and affect.  Nursing note and vitals reviewed.    ED Treatments / Results  Labs (all labs ordered are listed, but only abnormal results are displayed) Labs Reviewed  CBC WITH DIFFERENTIAL/PLATELET - Abnormal; Notable for the following components:      Result Value   WBC 3.4 (*)    RBC 2.96 (*)    Hemoglobin 9.4 (*)    HCT 30.8 (*)    MCV  104.1 (*)    Platelets 145 (*)    All other components within normal limits  COMPREHENSIVE METABOLIC PANEL - Abnormal; Notable for the following components:   Glucose, Bld 118 (*)    BUN 30 (*)    Creatinine, Ser 5.14 (*)    Calcium 7.6 (*)    Albumin 2.4 (*)    GFR calc non Af Amer 8 (*)    GFR calc Af Amer 10 (*)    All other components within normal limits  LIPID PANEL - Abnormal; Notable for the following components:   HDL 23 (*)    All other components within normal limits  CBG MONITORING, ED - Abnormal; Notable for the following components:   Glucose-Capillary 105 (*)    All other components within normal limits  I-STAT CG4 LACTIC ACID, ED - Abnormal; Notable for the following components:   Lactic Acid, Venous 4.75 (*)    All other components within normal limits  I-STAT ARTERIAL BLOOD GAS, ED - Abnormal; Notable for the following components:   pH, Arterial 7.315 (*)    All other components within normal limits  PROTIME-INR  APTT  TROPONIN I  TROPONIN I    EKG None  Radiology Ct Head Wo Contrast  Result Date: 10/03/2018 CLINICAL DATA:  Seizure EXAM: CT HEAD WITHOUT CONTRAST TECHNIQUE: Contiguous axial images were obtained from the base of the skull through the vertex without intravenous contrast. COMPARISON:  None. FINDINGS: Brain: There is no mass, hemorrhage or extra-axial collection. The size and configuration of the ventricles and extra-axial CSF spaces are normal. There is hypoattenuation of the periventricular white matter, most commonly indicating chronic ischemic microangiopathy. Vascular: No abnormal hyperdensity of the major intracranial arteries or dural venous sinuses. No intracranial atherosclerosis. Skull: The visualized skull base, calvarium and extracranial soft tissues are normal. Sinuses/Orbits: No fluid levels or advanced mucosal thickening of the visualized paranasal sinuses. No mastoid or middle ear effusion. The orbits are normal. IMPRESSION: Mild  chronic small vessel disease without acute intracranial abnormality. Electronically Signed   By: Ulyses Jarred M.D.   On: 10/03/2018 15:10   Dg Chest Port 1 View  Result Date: 10/03/2018 CLINICAL DATA:  Cardiac arrest. EXAM: PORTABLE CHEST 1 VIEW COMPARISON:  Chest x-ray dated July 06, 2018. FINDINGS: Unchanged tunneled right internal jugular dialysis catheter with the tip in the proximal right atrium. Stable cardiomegaly. Persistent low lung volumes with bibasilar atelectasis and bronchovascular crowding. Slightly  coarsened interstitial markings are similar to prior study. No focal consolidation, pleural effusion, or pneumothorax. No acute osseous abnormality. IMPRESSION: Stable chest. Poor inspiration with bibasilar atelectasis. No active disease. Electronically Signed   By: Titus Dubin M.D.   On: 10/03/2018 15:08    Procedures .Critical Care Performed by: Franchot Heidelberg, PA-C Authorized by: Franchot Heidelberg, PA-C   Critical care provider statement:    Critical care time (minutes):  45   Critical care time was exclusive of:  Separately billable procedures and treating other patients and teaching time   Critical care was necessary to treat or prevent imminent or life-threatening deterioration of the following conditions:  CNS failure or compromise   Critical care was time spent personally by me on the following activities:  Blood draw for specimens, development of treatment plan with patient or surrogate, discussions with consultants, evaluation of patient's response to treatment, examination of patient, obtaining history from patient or surrogate, ordering and performing treatments and interventions, ordering and review of laboratory studies, ordering and review of radiographic studies, pulse oximetry, re-evaluation of patient's condition and review of old charts   I assumed direction of critical care for this patient from another provider in my specialty: no   Comments:     Pt  with new onset seizure, AMS, and possible cardiac arrest. Repeat sz in ED- keppra and ativan given. Pt transported to hospital with neuro capacity and further imaging availability   (including critical care time)  Medications Ordered in ED Medications  0.9 %  sodium chloride infusion ( Intravenous Stopped 10/03/18 1837)  LORazepam (ATIVAN) tablet 0.5 mg (0.5 mg Oral Not Given 10/03/18 1537)  LORazepam (ATIVAN) injection 1 mg (1 mg Intravenous Given 10/03/18 1541)  levETIRAcetam (KEPPRA) IVPB 1000 mg/100 mL premix ( Intravenous Stopped 10/03/18 1612)  heparin lock flush 100 UNIT/ML injection (500 Units  Given 10/03/18 1652)     Initial Impression / Assessment and Plan / ED Course  I have reviewed the triage vital signs and the nursing notes.  Pertinent labs & imaging results that were available during my care of the patient were reviewed by me and considered in my medical decision making (see chart for details).     Pt presenting for evaluation of seizure, possible cardiac arrest, and altered mental status.  Initial exam concerning, patient is confused, that this is likely postictal considering the history.  Patient with concerning past medical history including ESRD on dialysis, liver transplant on immunosuppression.  Highest concern for seizure, cardiac arrest, sepsis.  Attain labs, chest x-ray, CT head, EKG. Gentle fluids for heart rate.  Discussed with attending, Dr Rex Kras evaluated the pt. exam reassuring however, no obvious neurologic deficit concerning for stroke.  Sitting history, low suspicion for true cardiac arrest, likely seizure.  Lactic elevated at 4.75.  This is likely secondary to the seizure and subsequent compressions. Doubt sepsis, no fever, leukocytosis, or infectious symptoms.  Initial troponin reassuring, and is negative.  EKG without STEMI.  Otherwise, labs are reassuring.  Leukopenia at 3.4, this is improved from her previous of 2.7.  Hemoglobin on the low side at 9.4,  concerning when compared to yesterday it has decreased from 11.  However, 1 month ago, hgb was 8.6. Consider chronic anemia due to esrd.  VBG reassuring. CXR viewed and interpreted by me, no pna, pnx, effusions, or cardiomegaly. CT head without acute findings.   Patient becoming more alert since the event in the clinic.  She is now alert and oriented x4.  However, she has started doing the same whole-body jerking/twitching movement. No myoclonus or obvious tonic-clonic movements. Consider need for ativan.   Pt's family called in Dr. Rex Kras, as pt had another seizure-like episode. Per Dr. Rex Kras, tonic-clonic movement had finished prior to her evaluation, but pt was rigid, had R gaze deviation, and had decreased O2 sats to the 80's. Ativan and loading dose of keppra given.   On reevaluation, patient is no longer postictal and is alert again. Repeat troponin negative, likely confirming lack of cardiac arrest.   Discussed with neurology, Dr. Cheral Marker agrees with transfer to [facility] for MRI, EEG, and further evaluation.  Recommends admission to the hospitalist service.  Discussed with Dr. Steffanie Dunn from [facility], patient to be admitted.  Patient to go to the stepdown unit.  Patient's blood pressure decreased into the 80s, as small fluid bolus given and blood pressure improved.  Patient had another episode of hypotension, once again resolved with fluid bolus. However, concern for fluid overload as pt is on HD.  Due to full capacity to stepdown unit, patient will likely need to stay in this ER for several hours/overnight.  I am concerned about the capacity to care for this patient, considering patient's recurrent episodes of hypotension, for multiple seizures, and her extensive medical history.  As such, will call to see if it is possible to do an ED to ED transfer.  Discussed with Dr. Ronnald Nian, who would accept the patient in transfer.  Dr. Lindzen/neurology requesting to be notified when  patient arrives to [facility].  Final Clinical Impressions(s) / ED Diagnoses   Final diagnoses:  Seizure [facility])    ED Discharge Orders    None       Franchot Heidelberg, PA-C 10/03/18 1903    Little, Wenda Overland, MD 10/04/18 1319

## 2018-10-03 NOTE — ED Notes (Signed)
Family at bedside. 

## 2018-10-03 NOTE — ED Notes (Signed)
Pt speaking with family members, unable to recall event. Pt alert to self and others. Pt able to move all 4 extremities. Pt VSS.

## 2018-10-03 NOTE — Unmapped (Signed)
[facility] Refill Coordination Note    Specialty Medication(s) to be Shipped:   Transplant: Neoral 25mg     Other medication(s) to be shipped: none     Katherine Norton, DOB: [birth year]  Phone: [phone] (home)       All above HIPAA information was verified with patient.     Completed refill call assessment today to schedule patient's medication shipment from the [facility] [phone]).       Specialty medication(s) and dose(s) confirmed: Regimen is correct and unchanged.   Changes to medications: Rudy reports no changes reported at this time.  Changes to insurance: No  Questions for the pharmacist: No    The patient will receive a drug information handout for each medication shipped and additional FDA Medication Guides as required.      DISEASE/MEDICATION-SPECIFIC INFORMATION        N/A    ADHERENCE     Medication Adherence    Patient reported X missed doses in the last month:  0                          MEDICARE PART B DOCUMENTATION     Neoral 25mg : Patient has 7 days worth of capsules on hand.    SHIPPING     Shipping address confirmed in Epic.     Delivery Scheduled: Yes, Expected medication delivery date: 10/05/18 via UPS or courier.     Medication will be delivered via UPS to the home address in Epic WAM.    [place] A Graycie Halley   [facility] Specialty Technician

## 2018-10-03 NOTE — Progress Notes (Signed)
Patient was being checked in when she started having what appeared to be a seizure, incontinent of urine. She was assisted to the floor and was found to be unresponsive without a pulse. CPR was given for 2 minutes, pulse and respirations returned. Emergency response team was present and assisted patient down to the ED. Please refer to code sheet.

## 2018-10-03 NOTE — ED Notes (Signed)
Assumed care on pt. , pt. resting with no distress/respirations unlabored , IV site intact , denies pain /respirations unlabored , pt. waiting for in-patient bed assignment ,plan of care explained to pt. and family .

## 2018-10-03 NOTE — ED Notes (Addendum)
Pt responding to voice- alert to self. Drowsy. Family at bedside. VSS. airway clear.

## 2018-10-03 NOTE — ED Notes (Signed)
Pt seizing- EDP at bedside. Pt Tami [name] placed on 6L. Pt unresponsive at this time. VSS. Airway clear, Ativan given per verbal order

## 2018-10-03 NOTE — ED Notes (Signed)
[facility] arrived to transport pt to [facility]

## 2018-10-03 NOTE — ED Notes (Signed)
Pt arrives from [facility] via [facility]. Pt A/O x4, no complaints other than chest pain from compressions.

## 2018-10-03 NOTE — ED Notes (Signed)
EDP aware of pt BP 

## 2018-10-03 NOTE — Consult Note (Signed)
Neurology Consultation Reason for Consult: New onset seizure Referring Physician: Dwaine Deter  CC: Seizure  History is obtained from: Chart review, patient  HPI: Tami [name] is a 62 y.o. female with no past medical of his seizures, but with ESRD who had an appointment today and remembers being in the venipuncture chair and about to get her venipuncture when she suddenly has no recollection.  She had some jerking type movements and was unresponsive.  She had 2 minutes of CPR.  It is unclear to me if she actually had venipuncture prior to her spell.   While in the emergency department at [facility] she had another episode concerning for seizure.  Ativan was given and she was given a dose of Keppra.  Description was right gaze deviation, desats to the mid 80s with rigidity.  Lactate was 4.75 following the episode.  Since that time, she has markedly improved, has some disconjugate gaze which she states is unusual for her.  She still has some mild right-sided weakness, but this is improving.  LKW: 1 PM tpa given?: no, stroke not suspected    ROS: A 14 point ROS was performed and is negative except as noted in the HPI.   Past Medical History:  Diagnosis Date  . Anemia   . Arthritis   . Chronic kidney disease    vas cath/M-W-F dialysis  . Gout   . Hepatitis, autoimmune ([place]) 12/08/2011  . Hypertension      Family History  Problem Relation Age of Onset  . Stroke Mother   . Cancer Father      Social History:  reports that she has never smoked. She has never used smokeless tobacco. She reports that she drank alcohol. She reports that she does not use drugs.   Exam: Current vital signs: BP 100/67   Pulse 92   Temp 98.5 F (36.9 C) (Rectal)   Resp 20   SpO2 100%  Vital signs in last 24 hours: Temp:  [98.5 F (36.9 C)] 98.5 F (36.9 C) (12/11 1438) Pulse Rate:  [92-117] 92 (12/11 1845) Resp:  [15-26] 20 (12/11 1845) BP: (88-128)/(59-90) 100/67 (12/11  1845) SpO2:  [94 %-100 %] 100 % (12/11 1845)   Physical Exam  Constitutional: Appears well-developed and well-nourished.  Psych: Affect appropriate to situation Eyes: No scleral injection HENT: No OP obstrucion Head: Normocephalic.  Cardiovascular: Normal rate and regular rhythm.  Respiratory: Effort normal, non-labored breathing GI: Soft.  No distension. There is no tenderness.  Skin: WDI  Neuro: Mental Status: Patient is awake, alert, oriented to person, place, month, year, and situation. Patient is able to give a clear and coherent history. No signs of aphasia or neglect Cranial Nerves: II: Visual Fields are full. Pupils are equal, round, and reactive to light.   III,IV, VI: EOMI without ptosis or diploplia.  V: Facial sensation is symmetric to temperature VII: Facial movement with questionable mild decrease in the right nasolabial fold VIII: hearing is intact to voice X: Uvula elevates symmetrically XI: Shoulder shrug is symmetric. XII: tongue is midline without atrophy or fasciculations.  Motor: Tone is normal. Bulk is normal. 5/5 strength was present in bilateral upper extremities, she has mild 4+/5 weakness in the right lower extremity, 5/5 in the left Sensory: Sensation is symmetric to light touch and temperature in the arms and legs. Cerebellar: No ataxia on finger-nose-finger bilaterally  I have reviewed labs in epic and the results pertinent to this consultation are:  CMP-elevated creatinine, borderline  calcium at 7.6 but her albumin is 2.4 Macrocytic anemia  I have reviewed the images obtained: CT head-unremarkable  Impression: 61 year old female with new onset seizures in the setting of liver transplant and immune suppression.  She has no evidence of infection, and she has returned to her baseline.  I would favor getting imaging and EEG prior to committing her to long-term AED therapy  Recommendations: 1) MRI brain with and without, MRA head 2) EEG 3) may  consider long-term AED therapy following above studies 4) would start Keppra if she has any further events.   Roland Rack, MD [facility] [phone]  If 7pm- 7am, please page neurology on call as listed in [place].

## 2018-10-03 NOTE — ED Notes (Signed)
Paged Hal Hope to [employer]

## 2018-10-03 NOTE — ED Notes (Signed)
ED Provider at bedside. 

## 2018-10-03 NOTE — Progress Notes (Signed)
1405 While speaking with patient, became unresponsive, seizure activity noted.  Code called, assisted to floor from wheelchair. CPR started. See Code Sheet. 1420 To ED accompanied by ED personnel.

## 2018-10-03 NOTE — ED Notes (Signed)
Patient transported to CT with RN 

## 2018-10-03 NOTE — ED Notes (Signed)
Xray to bedside.

## 2018-10-03 NOTE — ED Notes (Signed)
Notified [facility] that patient has arrived

## 2018-10-04 ENCOUNTER — Encounter (HOSPITAL_COMMUNITY): Payer: Self-pay | Admitting: Nephrology

## 2018-10-04 ENCOUNTER — Observation Stay (HOSPITAL_BASED_OUTPATIENT_CLINIC_OR_DEPARTMENT_OTHER): Payer: Medicaid Other

## 2018-10-04 DIAGNOSIS — I361 Nonrheumatic tricuspid (valve) insufficiency: Secondary | ICD-10-CM | POA: Diagnosis not present

## 2018-10-04 DIAGNOSIS — R569 Unspecified convulsions: Secondary | ICD-10-CM | POA: Diagnosis not present

## 2018-10-04 LAB — CBG MONITORING, ED
Glucose-Capillary: 61 mg/dL — ABNORMAL LOW (ref 70–99)
Glucose-Capillary: 129 mg/dL — ABNORMAL HIGH (ref 70–99)
Glucose-Capillary: 61 mg/dL — ABNORMAL LOW (ref 70–99)
Glucose-Capillary: 75 mg/dL (ref 70–99)

## 2018-10-04 LAB — VITAMIN B12: Vitamin B-12: 301 pg/mL (ref 180–914)

## 2018-10-04 LAB — CBC
HCT: 28.2 % — ABNORMAL LOW (ref 36.0–46.0)
Hemoglobin: 8.7 g/dL — ABNORMAL LOW (ref 12.0–15.0)
MCH: 32.1 pg (ref 26.0–34.0)
MCHC: 30.9 g/dL (ref 30.0–36.0)
MCV: 104.1 fL — ABNORMAL HIGH (ref 80.0–100.0)
Platelets: 143 10*3/uL — ABNORMAL LOW (ref 150–400)
RBC: 2.71 MIL/uL — AB (ref 3.87–5.11)
RDW: 15.2 % (ref 11.5–15.5)
WBC: 3.3 10*3/uL — ABNORMAL LOW (ref 4.0–10.5)
nRBC: 0 % (ref 0.0–0.2)

## 2018-10-04 LAB — CBC WITH DIFFERENTIAL/PLATELET
ABS IMMATURE GRANULOCYTES: 0.02 10*3/uL (ref 0.00–0.07)
BASOS ABS: 0 10*3/uL (ref 0.0–0.1)
Basophils Relative: 0 %
Eosinophils Absolute: 0.1 10*3/uL (ref 0.0–0.5)
Eosinophils Relative: 4 %
HCT: 29.8 % — ABNORMAL LOW (ref 36.0–46.0)
Hemoglobin: 8.7 g/dL — ABNORMAL LOW (ref 12.0–15.0)
Immature Granulocytes: 1 %
Lymphocytes Relative: 25 %
Lymphs Abs: 0.7 10*3/uL (ref 0.7–4.0)
MCH: 30.9 pg (ref 26.0–34.0)
MCHC: 29.2 g/dL — ABNORMAL LOW (ref 30.0–36.0)
MCV: 105.7 fL — ABNORMAL HIGH (ref 80.0–100.0)
Monocytes Absolute: 0.3 10*3/uL (ref 0.1–1.0)
Monocytes Relative: 11 %
Neutro Abs: 1.7 10*3/uL (ref 1.7–7.7)
Neutrophils Relative %: 59 %
Platelets: 150 10*3/uL (ref 150–400)
RBC: 2.82 MIL/uL — ABNORMAL LOW (ref 3.87–5.11)
RDW: 15.1 % (ref 11.5–15.5)
WBC: 2.9 10*3/uL — ABNORMAL LOW (ref 4.0–10.5)
nRBC: 0 % (ref 0.0–0.2)

## 2018-10-04 LAB — BASIC METABOLIC PANEL
Anion gap: 13 (ref 5–15)
BUN: 28 mg/dL — ABNORMAL HIGH (ref 8–23)
CO2: 25 mmol/L (ref 22–32)
Calcium: 7.3 mg/dL — ABNORMAL LOW (ref 8.9–10.3)
Chloride: 102 mmol/L (ref 98–111)
Creatinine, Ser: 5.62 mg/dL — ABNORMAL HIGH (ref 0.44–1.00)
GFR calc Af Amer: 9 mL/min — ABNORMAL LOW (ref >60)
GFR, EST NON AFRICAN AMERICAN: 7 mL/min — AB (ref >60)
Glucose, Bld: 113 mg/dL — ABNORMAL HIGH (ref 70–99)
Potassium: 4 mmol/L (ref 3.5–5.1)
Sodium: 140 mmol/L (ref 135–145)

## 2018-10-04 LAB — ECHOCARDIOGRAM COMPLETE
Height: 61 in
Weight: 2115.2 oz

## 2018-10-04 LAB — HEPATIC FUNCTION PANEL
ALT: <5 U/L (ref 0–44)
AST: 17 U/L (ref 15–41)
Albumin: 2 g/dL — ABNORMAL LOW (ref 3.5–5.0)
Alkaline Phosphatase: 66 U/L (ref 38–126)
Bilirubin, Direct: <0.1 mg/dL (ref 0.0–0.2)
Total Bilirubin: 0.4 mg/dL (ref 0.3–1.2)
Total Protein: 6.5 g/dL (ref 6.5–8.1)

## 2018-10-04 LAB — TROPONIN I
Troponin I: <0.03 ng/mL (ref <0.03)
Troponin I: <0.03 ng/mL (ref <0.03)

## 2018-10-04 LAB — LACTIC ACID, PLASMA
LACTIC ACID, VENOUS: 1.6 mmol/L (ref 0.5–1.9)
Lactic Acid, Venous: 0.9 mmol/L (ref 0.5–1.9)

## 2018-10-04 LAB — CREATININE, SERUM
Creatinine, Ser: 5.51 mg/dL — ABNORMAL HIGH (ref 0.44–1.00)
GFR calc Af Amer: 9 mL/min — ABNORMAL LOW (ref >60)
GFR calc non Af Amer: 8 mL/min — ABNORMAL LOW (ref >60)

## 2018-10-04 LAB — MRSA PCR SCREENING: MRSA by PCR: NEGATIVE

## 2018-10-04 LAB — MAGNESIUM: Magnesium: 1.5 mg/dL — ABNORMAL LOW (ref 1.7–2.4)

## 2018-10-04 LAB — FOLATE: Folate: 54.9 ng/mL (ref >5.9)

## 2018-10-04 LAB — TSH: TSH: 0.831 u[IU]/mL (ref 0.350–4.500)

## 2018-10-04 MED ORDER — HEPARIN SODIUM (PORCINE) 1000 UNIT/ML DIALYSIS
2200.0000 [IU] | Freq: Once | INTRAMUSCULAR | Status: AC
  Administered 2018-10-04: 2200 [IU] via INTRAVENOUS_CENTRAL

## 2018-10-04 MED ORDER — CHLORHEXIDINE GLUCONATE CLOTH 2 % EX PADS: 6.0000 | MEDICATED_PAD | Freq: Every day | CUTANEOUS | Status: DC

## 2018-10-04 MED ORDER — PANCRELIPASE (LIP-PROT-AMYL) 12000-38000 UNITS PO CPEP
12000.0000 [IU] | ORAL_CAPSULE | Freq: Three times a day (TID) | ORAL | Status: DC
  Administered 2018-10-04 – 2018-10-05 (×5): 12000 [IU] via ORAL
  Filled 2018-10-04 (×6): qty 1

## 2018-10-04 MED ORDER — LABETALOL HCL 100 MG PO TABS
100.0000 mg | ORAL_TABLET | Freq: Two times a day (BID) | ORAL | Status: DC
  Administered 2018-10-04 – 2018-10-05 (×2): 100 mg via ORAL
  Filled 2018-10-04 (×4): qty 1

## 2018-10-04 MED ORDER — MAGNESIUM SULFATE 2 GM/50ML IV SOLN
2.0000 g | Freq: Once | INTRAVENOUS | Status: AC
  Administered 2018-10-04: 2 g via INTRAVENOUS
  Filled 2018-10-04: qty 50

## 2018-10-04 MED ORDER — ALBUMIN HUMAN 25 % IV SOLN
INTRAVENOUS | Status: AC
  Administered 2018-10-04: 25 g via INTRAVENOUS
  Filled 2018-10-04: qty 100

## 2018-10-04 MED ORDER — HEPARIN SODIUM (PORCINE) 1000 UNIT/ML IJ SOLN
INTRAMUSCULAR | Status: AC
  Administered 2018-10-04: 1000 [IU] via INTRAVENOUS_CENTRAL
  Filled 2018-10-04: qty 4

## 2018-10-04 MED ORDER — CHLORHEXIDINE GLUCONATE CLOTH 2 % EX PADS
6.0000 | MEDICATED_PAD | Freq: Every day | CUTANEOUS | Status: DC
  Administered 2018-10-05: 6 via TOPICAL

## 2018-10-04 MED ORDER — HEPARIN SODIUM (PORCINE) 1000 UNIT/ML DIALYSIS
1000.0000 [IU] | INTRAMUSCULAR | Status: DC | PRN
  Administered 2018-10-04: 1000 [IU] via INTRAVENOUS_CENTRAL
  Filled 2018-10-04: qty 1

## 2018-10-04 MED ORDER — DEXTROSE 50 % IV SOLN
INTRAVENOUS | Status: AC
  Administered 2018-10-04: 50 mL
  Filled 2018-10-04: qty 50

## 2018-10-04 MED ORDER — HEPARIN SODIUM (PORCINE) 1000 UNIT/ML IJ SOLN
INTRAMUSCULAR | Status: AC
  Administered 2018-10-04: 2200 [IU] via INTRAVENOUS_CENTRAL
  Filled 2018-10-04: qty 3

## 2018-10-04 MED ORDER — ALBUMIN HUMAN 25 % IV SOLN
25.0000 g | Freq: Once | INTRAVENOUS | Status: AC
  Administered 2018-10-04: 25 g via INTRAVENOUS

## 2018-10-04 MED ORDER — OXYCODONE-ACETAMINOPHEN 5-325 MG PO TABS
1.0000 | ORAL_TABLET | Freq: Four times a day (QID) | ORAL | Status: DC | PRN
  Administered 2018-10-05: 2 via ORAL
  Filled 2018-10-04: qty 2

## 2018-10-04 MED ORDER — HEPARIN SODIUM (PORCINE) 1000 UNIT/ML DIALYSIS
2200.0000 [IU] | Freq: Once | INTRAMUSCULAR | Status: AC
  Administered 2018-10-05: 2200 [IU] via INTRAVENOUS_CENTRAL

## 2018-10-04 MED FILL — NEORAL 25 MG CAPSULE: 30 days supply | Qty: 120 | Fill #1

## 2018-10-04 MED FILL — NEORAL 25 MG CAPSULE: 30 days supply | Qty: 120 | Fill #1 | Status: AC

## 2018-10-04 NOTE — Progress Notes (Signed)
PROGRESS NOTE                                                                                                                                                                                                             Patient Demographics:    Tami [name], is a 62 y.o. female, DOB - 1956/07/18, TGY:[id number]  Admit date - 10/03/2018   Admitting Physician Janora Norlander, MD  Outpatient Primary MD for the patient is [facility] [place]  LOS - 1  Chief Complaint  Patient presents with  . Seizures       Brief Narrative  Tami [name] is a 62 y.o. 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  As also was concern for some cardiac arrest for which patient was given CPR as per the report.  No medications were given at that time.  Patient was transferred to [facility].  In the ER she was clinically stable with no focal deficits, EKG, troponin 3 sets, CT head and MRI brain were unremarkable.  She was seen by neuro and admitted for EEG, nephrology has also been consulted for HD.   Subjective:    Tami [name] today has, No headache, No chest pain, No abdominal pain - No Nausea, No new weakness tingling or numbness, No Cough - SOB.     Assessment  & Plan :     1.  Possible seizures with possible cardiac arrest requiring CPR briefly - doubt she had cardiac arrest EKG 3 sets of troponin negative, she actually never received any medications, had any chest pain or palpitations hence doubt she had a cardiac arrest, she did apparently have seizure-like activity with Bowel incontinence and post ictal state and another episode in the ER.  Seen by neurology, MRI nonacute, EEG pending, received a dose of Keppra.  Currently stable and symptom-free, no headache or focal deficits, will transfer to a telemetry bed and  monitor.  2.  Encephalopathy due to seizures.  Resolved.  3.  ESRD.  On MWF schedule, missed dialysis on Wednesday, renal consulted.  4.  Anemia of chronic disease.  Stable.  5.  Hypertension.  Home medications resumed will monitor.  6.  History of liver and pancreatic transplant -acute issues continue home medications and outpatient follow-up as needed.    Family Communication  :  Son  Code Status :  Full  Disposition Plan  :  Tele  Consults  :  Neuro, Renal  Procedures  :   EEG  MRI/A - MRI HEAD: 1. No acute intracranial process. 2. Mild chronic small vessel ischemic changes. Old RIGHT basal ganglia lacunar infarct. MRA HEAD: 1. No emergent large vessel occlusion. 2. Severe LEFT and moderate RIGHT ICA stenosis.   DVT Prophylaxis  :    Heparin   Lab Results  Component Value Date   PLT 150 10/04/2018    Diet :  Diet Order            Diet renal with fluid restriction Fluid restriction: 1200 mL Fluid; Room service appropriate? Yes; Fluid consistency: Thin  Diet effective now               Inpatient Medications Scheduled Meds: . cycloSPORINE modified  50 mg Oral BID  . heparin  5,000 Units Subcutaneous Q8H  . labetalol  100 mg Oral BID  . lipase/protease/amylase  12,000 Units Oral TID AC   Continuous Infusions: PRN Meds:.acetaminophen **OR** acetaminophen, albuterol, labetalol, ondansetron **OR** ondansetron (ZOFRAN) IV  Antibiotics  :   Anti-infectives (From admission, onward)   None          Objective:   Vitals:   10/04/18 0252 10/04/18 0320 10/04/18 0530 10/04/18 0739  BP: 117/84 (!) 114/7 102/69 117/78  Pulse: 78 72 86 80  Resp: 16 16 18 16   Temp:      TempSrc:      SpO2: 100% 100% 100% 100%  Weight:      Height:        Wt Readings from Last 3 Encounters:  10/03/18 63.1 kg  09/25/18 61.7 kg  08/16/18 65.8 kg     Intake/Output Summary (Last 24 hours) at 10/04/2018 0933 Last data filed at 10/03/2018 1837 Gross per 24 hour  Intake  1582.41 ml  Output -  Net 1582.41 ml     Physical Exam  Awake Alert, Oriented X 3, No new F.N deficits, Normal affect [place].AT,PERRAL Supple Neck,No JVD, No cervical lymphadenopathy appriciated.  Symmetrical Chest wall movement, Good air movement bilaterally, CTAB RRR,No Gallops,Rubs or new Murmurs, No Parasternal Heave +ve B.Sounds, Abd Soft, No tenderness, No organomegaly appriciated, No rebound - guarding or rigidity. No Cyanosis, Clubbing or edema, No new Rash or bruise       Data Review:    CBC Recent Labs  Lab 10/02/18 0622 10/03/18 1338 10/03/18 1433 10/03/18 2324 10/04/18 0535  WBC  --  3.3* 3.4* 3.3* 2.9*  HGB 11.6* 9.6* 9.4* 8.7* 8.7*  HCT 34.0* 31.3* 30.8* 28.2* 29.8*  PLT  --  166 145* 143* 150  MCV  --  103.6* 104.1* 104.1* 105.7*  MCH  --  31.8 31.8 32.1 30.9  MCHC  --  30.7 30.5 30.9 29.2*  RDW  --  15.4 15.4 15.2 15.1  LYMPHSABS  --  1.0 1.2  --  0.7  MONOABS  --  0.4 0.4  --  0.3  EOSABS  --  0.1 0.1  --  0.1  BASOSABS  --  0.0 0.0  --  0.0    Chemistries  Recent Labs  Lab 10/02/18 0622 10/03/18 1338 10/03/18 1433 10/03/18 2324 10/03/18 2325 10/04/18 0535  NA 139 136 137  --   --  140  K 3.7 3.9 4.0  --   --  4.0  CL  --  97* 101  --   --  102  CO2  --  28 22  --   --  25  GLUCOSE 94 91 118*  --   --  113*  BUN  --  29* 30*  --   --  28*  CREATININE  --  5.16* 5.14* 5.51*  --  5.62*  CALCIUM  --  7.8* 7.6*  --   --  7.3*  MG  --   --   --   --  1.5*  --   AST  --  13* 24  --   --  17  ALT  --  1 <5  --   --  <5  ALKPHOS  --  78 74  --   --  66  BILITOT  --  0.4 0.5  --   --  0.4   ------------------------------------------------------------------------------------------------------------------ Recent Labs    10/03/18 1433  CHOL 124  HDL 23*  LDLCALC 73  TRIG 138  CHOLHDL 5.4    No results found for: HGBA1C ------------------------------------------------------------------------------------------------------------------ Recent  Labs    10/03/18 2325  TSH 0.831   ------------------------------------------------------------------------------------------------------------------ Recent Labs    10/03/18 2324 10/03/18 2325  VITAMINB12 301  --   FOLATE  --  54.9    Coagulation profile Recent Labs  Lab 10/03/18 1433  INR 1.17    No results for input(s): DDIMER in the last 72 hours.  Cardiac Enzymes Recent Labs  Lab 10/03/18 1701 10/03/18 2324 10/04/18 0535  TROPONINI <0.03 <0.03 <0.03   ------------------------------------------------------------------------------------------------------------------ No results found for: BNP  Micro Results No results found for this or any previous visit (from the past 240 hour(s)).  Radiology Reports Ct Head Wo Contrast  Result Date: 10/03/2018 CLINICAL DATA:  Seizure EXAM: CT HEAD WITHOUT CONTRAST TECHNIQUE: Contiguous axial images were obtained from the base of the skull through the vertex without intravenous contrast. COMPARISON:  None. FINDINGS: Brain: There is no mass, hemorrhage or extra-axial collection. The size and configuration of the ventricles and extra-axial CSF spaces are normal. There is hypoattenuation of the periventricular white matter, most commonly indicating chronic ischemic microangiopathy. Vascular: No abnormal hyperdensity of the major intracranial arteries or dural venous sinuses. No intracranial atherosclerosis. Skull: The visualized skull base, calvarium and extracranial soft tissues are normal. Sinuses/Orbits: No fluid levels or advanced mucosal thickening of the visualized paranasal sinuses. No mastoid or middle ear effusion. The orbits are normal. IMPRESSION: Mild chronic small vessel disease without acute intracranial abnormality. Electronically Signed   By: Ulyses Jarred M.D.   On: 10/03/2018 15:10   Mr Jodene Nam Head Wo Contrast  Result Date: 10/03/2018 CLINICAL DATA:  Seizure activity, 2 minutes episode of cardiac arrest. No history of  seizures. History of autoimmune hepatitis, status post liver transplant. EXAM: MRI HEAD WITHOUT CONTRAST MRA HEAD WITHOUT CONTRAST TECHNIQUE: Multiplanar, multiecho pulse sequences of the brain and surrounding structures were obtained without intravenous contrast. Angiographic images of the head were obtained using MRA technique without contrast. COMPARISON:  CT HEAD October 03, 2018 FINDINGS: MRI HEAD FINDINGS INTRACRANIAL CONTENTS: No reduced diffusion to suggest acute ischemia. No susceptibility artifact to suggest hemorrhage. The ventricles and sulci are normal for patient's age. Scattered subcentimeter supratentorial white matter FLAIR T2 hyperintensities. Hazy T2 hyperintense signal within the pons. 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 Mildly prominent basal ganglia perivascular spaces associated with chronic small vessel ischemic changes. No suspicious parenchymal signal, masses, mass effect. No abnormal extra-axial fluid collections. No extra-axial masses. Normal symmetric hippocampal size, morphology and signal. VASCULAR: Normal major intracranial vascular flow voids present at skull  base. SKULL AND UPPER CERVICAL SPINE: No abnormal sellar expansion. No suspicious calvarial bone marrow signal. Severe LEFT temporomandibular osteoarthrosis. Craniocervical junction maintained. SINUSES/ORBITS: The mastoid air-cells and included paranasal sinuses are well-aerated.The included ocular globes and orbital contents are non-suspicious. Status post bilateral ocular lens implants. OTHER: Patient is edentulous. MRA HEAD FINDINGS ANTERIOR CIRCULATION: Flow related enhancement of the included cervical, petrous, cavernous and supraclinoid internal carotid arteries. Luminal irregularity bilateral internal carotid arteries corresponding to calcific atherosclerosis on today's CT; moderate RIGHT and severe LEFT paraclinoid stenosis. Patent anterior communicating artery. Patent anterior and middle cerebral arteries. No  large vessel occlusion, aneurysm. POSTERIOR CIRCULATION: Codominant vertebral arteries. Vertebrobasilar arteries are patent, with normal flow related enhancement of the main branch vessels. Patent posterior cerebral arteries. Small LEFT posterior communicating artery present. No large vessel occlusion, flow limiting stenosis,  aneurysm. ANATOMIC VARIANTS: None. Source images and MIP images were reviewed. IMPRESSION: MRI HEAD: 1. No acute intracranial process. 2. Mild chronic small vessel ischemic changes. Old RIGHT basal ganglia lacunar infarct. MRA HEAD: 1. No emergent large vessel occlusion. 2. Severe LEFT and moderate RIGHT ICA stenosis. Electronically Signed   By: Elon Alas M.D.   On: 10/03/2018 22:52   Mr Brain Wo Contrast  Result Date: 10/03/2018 CLINICAL DATA:  Seizure activity, 2 minutes episode of cardiac arrest. No history of seizures. History of autoimmune hepatitis, status post liver transplant. EXAM: MRI HEAD WITHOUT CONTRAST MRA HEAD WITHOUT CONTRAST TECHNIQUE: Multiplanar, multiecho pulse sequences of the brain and surrounding structures were obtained without intravenous contrast. Angiographic images of the head were obtained using MRA technique without contrast. COMPARISON:  CT HEAD October 03, 2018 FINDINGS: MRI HEAD FINDINGS INTRACRANIAL CONTENTS: No reduced diffusion to suggest acute ischemia. No susceptibility artifact to suggest hemorrhage. The ventricles and sulci are normal for patient's age. Scattered subcentimeter supratentorial white matter FLAIR T2 hyperintensities. Hazy T2 hyperintense signal within the pons. 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 Mildly prominent basal ganglia perivascular spaces associated with chronic small vessel ischemic changes. No suspicious parenchymal signal, masses, mass effect. No abnormal extra-axial fluid collections. No extra-axial masses. Normal symmetric hippocampal size, morphology and signal. VASCULAR: Normal major intracranial vascular  flow voids present at skull base. SKULL AND UPPER CERVICAL SPINE: No abnormal sellar expansion. No suspicious calvarial bone marrow signal. Severe LEFT temporomandibular osteoarthrosis. Craniocervical junction maintained. SINUSES/ORBITS: The mastoid air-cells and included paranasal sinuses are well-aerated.The included ocular globes and orbital contents are non-suspicious. Status post bilateral ocular lens implants. OTHER: Patient is edentulous. MRA HEAD FINDINGS ANTERIOR CIRCULATION: Flow related enhancement of the included cervical, petrous, cavernous and supraclinoid internal carotid arteries. Luminal irregularity bilateral internal carotid arteries corresponding to calcific atherosclerosis on today's CT; moderate RIGHT and severe LEFT paraclinoid stenosis. Patent anterior communicating artery. Patent anterior and middle cerebral arteries. No large vessel occlusion, aneurysm. POSTERIOR CIRCULATION: Codominant vertebral arteries. Vertebrobasilar arteries are patent, with normal flow related enhancement of the main branch vessels. Patent posterior cerebral arteries. Small LEFT posterior communicating artery present. No large vessel occlusion, flow limiting stenosis,  aneurysm. ANATOMIC VARIANTS: None. Source images and MIP images were reviewed. IMPRESSION: MRI HEAD: 1. No acute intracranial process. 2. Mild chronic small vessel ischemic changes. Old RIGHT basal ganglia lacunar infarct. MRA HEAD: 1. No emergent large vessel occlusion. 2. Severe LEFT and moderate RIGHT ICA stenosis. Electronically Signed   By: Elon Alas M.D.   On: 10/03/2018 22:52   Dg Chest Port 1 View  Result Date: 10/03/2018 CLINICAL DATA:  Cardiac arrest. EXAM: PORTABLE CHEST  1 VIEW COMPARISON:  Chest x-ray dated July 06, 2018. FINDINGS: Unchanged tunneled right internal jugular dialysis catheter with the tip in the proximal right atrium. Stable cardiomegaly. Persistent low lung volumes with bibasilar atelectasis and  bronchovascular crowding. Slightly coarsened interstitial markings are similar to prior study. No focal consolidation, pleural effusion, or pneumothorax. No acute osseous abnormality. IMPRESSION: Stable chest. Poor inspiration with bibasilar atelectasis. No active disease. Electronically Signed   By: Titus Dubin M.D.   On: 10/03/2018 15:08   Vas [name] Upper Extremity Arterial Duplex  Result Date: 09/25/2018 UPPER EXTREMITY DUPLEX STUDY Indications: New access. History:     ESRD.  Risk Factors: Hypertension, Diabetes. Performing Technologist: Ronal Fear RVS, RCS  Examination Guidelines: A complete evaluation includes B-mode imaging, spectral Doppler, color Doppler, and power Doppler as needed of all accessible portions of each vessel. Bilateral testing is considered an integral part of a complete examination. Limited examinations for reoccurring indications may be performed as noted.  Right Pre-Dialysis Findings: +-----------------------+----------+--------------------+---------+--------+ Location               PSV (cm/s)Intralum. Diam. (cm)Waveform Comments +-----------------------+----------+--------------------+---------+--------+ Brachial Antecub. fossa55        0.39                triphasic         +-----------------------+----------+--------------------+---------+--------+ Radial Art at Wrist    81        0.19                triphasic         +-----------------------+----------+--------------------+---------+--------+ Ulnar Art at Wrist     32        0.23                triphasic         +-----------------------+----------+--------------------+---------+--------+ Left Pre-Dialysis Findings: +-----------------------+----------+--------------------+---------+---------+ Location               PSV (cm/s)Intralum. Diam. (cm)Waveform Comments  +-----------------------+----------+--------------------+---------+---------+ Brachial Antecub. fossa55        0.38                 triphasic          +-----------------------+----------+--------------------+---------+---------+ Radial Art at Wrist    55        0.19                triphasic          +-----------------------+----------+--------------------+---------+---------+ Ulnar Art at Wrist     24        0.16                triphasiccalcified +-----------------------+----------+--------------------+---------+---------+  Summary:  Right: No obstruction visualized in the right upper extremity. Left: No obstruction visualized in the left upper extremity. *See table(s) above for measurements and observations. Electronically signed by Curt Jews MD on 09/25/2018 at 2:04:00 PM.    Final    Vas [name] Upper Ext Vein Mapping (pre-op Avf)  Result Date: 09/25/2018 UPPER EXTREMITY VEIN MAPPING  Indications: Pre-access. History: ESRD.  Performing Technologist: Ronal Fear RVS, RCS  Examination Guidelines: A complete evaluation includes B-mode imaging, spectral Doppler, color Doppler, and power Doppler as needed of all accessible portions of each vessel. Bilateral testing is considered an integral part of a complete examination. Limited examinations for reoccurring indications may be performed as noted. +-----------------+-------------+----------+--------+ Right Cephalic   Diameter (cm)Depth (cm)Findings +-----------------+-------------+----------+--------+ Prox upper arm       0.23                        +-----------------+-------------+----------+--------+  Mid upper arm        0.12                        +-----------------+-------------+----------+--------+ Dist upper arm       0.11                        +-----------------+-------------+----------+--------+ Antecubital fossa    0.20                        +-----------------+-------------+----------+--------+ Prox forearm         0.10                        +-----------------+-------------+----------+--------+ Mid forearm          0.10                         +-----------------+-------------+----------+--------+ Dist forearm         0.12                        +-----------------+-------------+----------+--------+ +-----------------+-------------+----------+--------+ Right Basilic    Diameter (cm)Depth (cm)Findings +-----------------+-------------+----------+--------+ Mid upper arm        0.58                        +-----------------+-------------+----------+--------+ Dist upper arm       0.46                        +-----------------+-------------+----------+--------+ Antecubital fossa    0.36                        +-----------------+-------------+----------+--------+ Prox forearm         0.22                        +-----------------+-------------+----------+--------+ +-----------------+-------------+----------+--------+ Left Cephalic    Diameter (cm)Depth (cm)Findings +-----------------+-------------+----------+--------+ Shoulder             0.33                        +-----------------+-------------+----------+--------+ Prox upper arm       0.31                        +-----------------+-------------+----------+--------+ Mid upper arm        0.29                        +-----------------+-------------+----------+--------+ Dist upper arm       0.33                        +-----------------+-------------+----------+--------+ Antecubital fossa    0.36                        +-----------------+-------------+----------+--------+ Prox forearm         0.39                        +-----------------+-------------+----------+--------+ Mid forearm          0.34                        +-----------------+-------------+----------+--------+  Dist forearm         0.30                        +-----------------+-------------+----------+--------+ +-----------------+-------------+----------+--------+ Left Basilic     Diameter (cm)Depth (cm)Findings  +-----------------+-------------+----------+--------+ Prox upper arm       0.68                        +-----------------+-------------+----------+--------+ Mid upper arm        0.47                        +-----------------+-------------+----------+--------+ Dist upper arm       0.47                        +-----------------+-------------+----------+--------+ Antecubital fossa    0.46                        +-----------------+-------------+----------+--------+ Prox forearm         0.36                        +-----------------+-------------+----------+--------+ Summary: Right: Patent and compressible cephalic and basilic veins. Left: Patent and compressible cephalic and basilic veins. *See table(s) above for measurements and observations.  Diagnosing physician: Curt Jews MD Electronically signed by Curt Jews MD on 09/25/2018 at 2:04:10 PM.    Final     Time Spent in minutes  30   Lala Lund M.D on 10/04/2018 at 9:33 AM  To page go to [email or web address] - password [email or web address]

## 2018-10-04 NOTE — ED Notes (Signed)
Pt given orange juice per MD. 

## 2018-10-04 NOTE — Progress Notes (Addendum)
HD tx initiated via HD cath w/o problem Bilat ports: pull/push/flush equally w/o problem VSS w/ soft bp Will continue to monitor while on HD tx

## 2018-10-04 NOTE — Consult Note (Signed)
Renal Service Consult Note [facility]  Tami [name] 10/04/2018 Sol [place] Requesting Physician:  Dr Candiss Norse  Reason for Consult:  ESRD pt w/ seizures HPI: The patient is a 62 y.o. year-old w/ hx of liver transplant, ESRD on HD < 53yr was visiting [facility] for anemia Rx and while in the office became unresponsive w/ seizure activity.  Code was called, received brief resuscitation efforts for lack of pulse and respirations. After approx 2 min pt recovered and was taken to ED per EMT.  Seen by neurology in ED for new onset seizures, pt was back to baseline.  MRI/ MRA / EEG ordered. Pt admitted. Asked to see for ESRD.    Patient has no c/o's. 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  Was at [facility] Sept 2019 admitted for perf DU and had AKI became HD dependent. Had trach and PEG as well (trach since decannulated). DC'd to [facility] and started HD at [facility] in Sept 2019. Marland Kitchen  Had LUA fistula just placed 2 days ago by Dr Donzetta Matters here.  MWF HD.  No sob, cough, CP or abd pain.    ROS  denies CP  no joint pain   no HA  no blurry vision  no rash  no diarrhea  no nausea/ vomiting  no dysuria  no difficulty voiding  no change in urine color    Past Medical History  Past Medical History:  Diagnosis Date  . Anemia   . Arthritis   . Chronic kidney disease    vas cath/M-W-F dialysis  . Gout   . Hepatitis, autoimmune ([place]) 12/08/2011  . Hypertension    Past Surgical History  Past Surgical History:  Procedure Laterality Date  . ABDOMINAL HYSTERECTOMY    . AV FISTULA PLACEMENT Left 10/02/2018   Procedure: Creation of left arm Brachiocephalic Fistula;  Surgeon: Waynetta Sandy, MD;  Location: [place];  Service: Vascular;  Laterality: Left;  . LIVER TRANSPLANT     1996  . TONSILLECTOMY     Family History  Family History  Problem Relation Age of Onset  . Stroke Mother   . Cancer Father    Social History  reports that she has never smoked. She has never used smokeless  tobacco. She reports previous alcohol use. She reports that she does not use drugs. Allergies  Allergies  Allergen Reactions  . Iodinated Diagnostic Agents     UNSPECIFIED REACTION   . Ioxaglate     UNSPECIFIED REACTION   . Latex     UNSPECIFIED REACTION    Home medications Prior to Admission medications   Medication Sig Start Date End Date Taking? Authorizing Provider  albuterol (PROVENTIL HFA;VENTOLIN HFA) 108 (90 Base) MCG/ACT inhaler Inhale 2 puffs into the lungs every 6 (six) hours as needed.  07/20/18  Yes [provider]  alendronate (FOSAMAX) 70 MG tablet Take 70 mg by mouth once a week. Give on Monday's 07/23/18  Yes [provider]  allopurinol (ZYLOPRIM) 100 MG tablet Take 100 mg by mouth daily. 03/18/15  Yes [provider]  cycloSPORINE modified (NEORAL) 25 MG capsule Take 50 mg by mouth 2 (two) times daily.    Yes [provider]  ENSURE (ENSURE) Give 120 cc by mouth two times daily 07/30/18  Yes [provider]  labetalol (NORMODYNE) 100 MG tablet Take 100 mg by mouth 2 (two) times daily. 07/20/18  Yes [provider]  ondansetron (ZOFRAN) 4 MG tablet Take 4 mg  by mouth every 6 (six) hours as needed for nausea or vomiting. 07/25/18  Yes [provider]  Oxycodone HCl 10 MG TABS Take 1 tablet (10 mg total) by mouth every 6 (six) hours as needed for up to 10 doses (severe pain). - hold for sedation 10/02/18  Yes Dagoberto Ligas, PA-C  Pancrelipase, Lip-Prot-Amyl, 24000-76000 units CPEP Give 1 capsule by mouth with meals 07/20/18  Yes [provider]  pantoprazole (PROTONIX) 40 MG tablet Take 40 mg by mouth 2 (two) times daily. 08/01/18  Yes [provider]  loratadine (CLARITIN) 10 MG tablet Take 10 mg by mouth daily. X 10 days 08/11/18 08/21/18  [provider]  zolpidem (AMBIEN) 5 MG tablet Take 1 tablet (5 mg total) by mouth at bedtime. Patient taking differently: Take 5 mg by mouth at bedtime  as needed for sleep.  07/31/18   Gerlene Fee, NP   Liver Function Tests Recent Labs  Lab 10/03/18 1338 10/03/18 1433 10/04/18 0535  AST 13* 24 17  ALT 1 <5 <5  ALKPHOS 78 74 66  BILITOT 0.4 0.5 0.4  PROT 7.3 7.4 6.5  ALBUMIN 3.0* 2.4* 2.0*   No results for input(s): LIPASE, AMYLASE in the last 168 hours. CBC Recent Labs  Lab 10/03/18 1338 10/03/18 1433 10/03/18 2324 10/04/18 0535  WBC 3.3* 3.4* 3.3* 2.9*  NEUTROABS 1.8 1.7  --  1.7  HGB 9.6* 9.4* 8.7* 8.7*  HCT 31.3* 30.8* 28.2* 29.8*  MCV 103.6* 104.1* 104.1* 105.7*  PLT 166 145* 143* 128   Basic Metabolic Panel Recent Labs  Lab 10/02/18 0622 10/03/18 1338 10/03/18 1433 10/03/18 2324 10/04/18 0535  NA 139 136 137  --  140  K 3.7 3.9 4.0  --  4.0  CL  --  97* 101  --  102  CO2  --  28 22  --  25  GLUCOSE 94 91 118*  --  113*  BUN  --  29* 30*  --  28*  CREATININE  --  5.16* 5.14* 5.51* 5.62*  CALCIUM  --  7.8* 7.6*  --  7.3*   Iron/TIBC/Ferritin/ %Sat    Component Value Date/Time   IRON 90 10/13/2017 1106   TIBC 174 (L) 10/13/2017 1106   FERRITIN 710 (H) 10/13/2017 1106   IRONPCTSAT 52 10/13/2017 1106   IRONPCTSAT 20 02/18/2013 1333    Vitals:   10/04/18 0739 10/04/18 0959 10/04/18 1004 10/04/18 1139  BP: 117/78 115/79 116/75 106/75  Pulse: 80 90 90 83  Resp: 16 18  20   Temp:      TempSrc:      SpO2: 100% 100%  100%  Weight:      Height:       Exam Gen alert, chron ill appearing, not in distress No rash, cyanosis or gangrene Sclera anicteric, throat clear  No jvd or bruits Chest bronchial BS R base 1/2 up, L mostly CTA RRR no MRG Abd soft ntnd no mass or ascites +bs obese GU deferred MS no joint effusions or deformity Ext 1+ bilat LE edema, no wounds or ulcers Neuro is alert, Ox 3 , nf, gen'd weakness R IJ TDC/ LUA AVF recently placed, +bruit   Home meds:  - labetalol 100 bid  - allopurinol 100 qd/ pantoprazole 40 bid/ alendronate 70 q wk  - zolpidem 5 hs prn/ oxycodone 10mg  qid  prn  - cyclosporine 50mg  bid   Dialysis: [facility]  4h  400/1.5  61kg  3K/ 2Ca bath  Hep 2200  RIJ TDC/ LUA AVF(new 12/10 Dr Scot Dock) - calc 0.5 tiw - darbe 60 ug per wk last 12/4     Impression/ Plan: 1. New onset seizures - w/u in progress, MRI/ MRA negative.  Neuro following.  2. Hx liver transplant 1999 for AIH - on cyclosporine 3. ESRD on HD MWF.  Missed HD plan short HD today and again tomorrow to get back on sched.  4. Volume - no excess on exam 5. HTN - on labetalol at home, BP's low normal here 6. SP LUA AVF - placed 10/02/18, patent 7. Anemia ckd - Hb around 9, give esa w/ HD today 8. MBD ckd - Ca ok, check [place] MD [place] pager [phone]   10/04/2018, 12:34 PM

## 2018-10-04 NOTE — Progress Notes (Signed)
HD tx completed @ 2220 w/o problem UF goal met Blood rinsed back VSS Report called to Ammi, RN

## 2018-10-04 NOTE — Progress Notes (Addendum)
[facility] paged about patient's pain meds that are taken at home (oxycodone 10mg ) listed on PTA med list. Awaiting call back.  2333-[facility] placed order for percocet/acetaminophen 1-2 for pain. Will continue to monitor patient's chronic back pain. Rates 8/10. Offered warm compress to back to help as well as repositioning patient in bed.

## 2018-10-04 NOTE — ED Notes (Signed)
Admitting MD ( Dr. Hal Hope ) notified on current CBG result.

## 2018-10-04 NOTE — Progress Notes (Signed)
Tami [name] is a 62 y.o. female patient admitted from ED awake, alert - oriented  X 4 - no acute distress noted.  VSS - Blood pressure 117/72, pulse 88, temperature 98.9 F (37.2 C), temperature source Oral, resp. rate 18, height 5\' 1"  (1.549 m), weight 60 kg, SpO2 91 %.    IV in place, occlusive dsg intact without redness.  Orientation to room, and floor completed with information packet given to patient/family. Admission INP armband ID verified with patient/family, and in place.   SR up x 2, fall assessment complete, with patient able to verbalize understanding of risk associated with falls, and verbalized understanding to call nsg before up out of bed.  Call light within reach, patient able to voice, and demonstrate understanding.  Skin, clean-dry- intact without evidence of bruising, or skin tears. Old, healed spot at coccyx but nothing open.   No evidence of skin break down noted on exam.     Will cont to eval and treat per MD orders.  Manuella Ghazi, RN 10/04/2018 6:13 PM

## 2018-10-04 NOTE — ED Notes (Signed)
Admitting MD paged to update on pt.'s hypoglycemia .

## 2018-10-04 NOTE — Progress Notes (Signed)
  Echocardiogram 2D Echocardiogram has been performed.  Johny Chess 10/04/2018, 4:14 PM

## 2018-10-04 NOTE — ED Notes (Signed)
Breakfast tray at bedside 

## 2018-10-04 NOTE — ED Notes (Signed)
Ordered breakfast, diet renal w/ fluid restriction  

## 2018-10-04 NOTE — ED Notes (Addendum)
Dr.Kakrakandy notified on pt.'s hypoglycemia.

## 2018-10-05 ENCOUNTER — Observation Stay (HOSPITAL_COMMUNITY): Payer: Medicaid Other

## 2018-10-05 DIAGNOSIS — R569 Unspecified convulsions: Secondary | ICD-10-CM | POA: Diagnosis not present

## 2018-10-05 LAB — GLUCOSE, CAPILLARY
Glucose-Capillary: 72 mg/dL (ref 70–99)
Glucose-Capillary: 84 mg/dL (ref 70–99)
Glucose-Capillary: 85 mg/dL (ref 70–99)
Glucose-Capillary: 91 mg/dL (ref 70–99)

## 2018-10-05 LAB — CBC
HCT: 28.7 % — ABNORMAL LOW (ref 36.0–46.0)
Hemoglobin: 9 g/dL — ABNORMAL LOW (ref 12.0–15.0)
MCH: 31.6 pg (ref 26.0–34.0)
MCHC: 31.4 g/dL (ref 30.0–36.0)
MCV: 100.7 fL — ABNORMAL HIGH (ref 80.0–100.0)
Platelets: 134 10*3/uL — ABNORMAL LOW (ref 150–400)
RBC: 2.85 MIL/uL — ABNORMAL LOW (ref 3.87–5.11)
RDW: 14.6 % (ref 11.5–15.5)
WBC: 2.9 10*3/uL — ABNORMAL LOW (ref 4.0–10.5)
nRBC: 0 % (ref 0.0–0.2)

## 2018-10-05 LAB — BASIC METABOLIC PANEL
Anion gap: 11 (ref 5–15)
BUN: 8 mg/dL (ref 8–23)
CALCIUM: 7.9 mg/dL — AB (ref 8.9–10.3)
CO2: 28 mmol/L (ref 22–32)
Chloride: 98 mmol/L (ref 98–111)
Creatinine, Ser: 2.82 mg/dL — ABNORMAL HIGH (ref 0.44–1.00)
GFR calc Af Amer: 20 mL/min — ABNORMAL LOW (ref >60)
GFR calc non Af Amer: 17 mL/min — ABNORMAL LOW (ref >60)
Glucose, Bld: 86 mg/dL (ref 70–99)
Potassium: 3.8 mmol/L (ref 3.5–5.1)
Sodium: 137 mmol/L (ref 135–145)

## 2018-10-05 LAB — MAGNESIUM: Magnesium: 1.9 mg/dL (ref 1.7–2.4)

## 2018-10-05 MED ORDER — HEPARIN SODIUM (PORCINE) 1000 UNIT/ML DIALYSIS
3200.0000 [IU] | INTRAMUSCULAR | Status: DC | PRN
  Administered 2018-10-05: 3200 [IU]
  Filled 2018-10-05: qty 3.2

## 2018-10-05 MED ORDER — HEPARIN SODIUM (PORCINE) 1000 UNIT/ML IJ SOLN
INTRAMUSCULAR | Status: AC
  Filled 2018-10-05: qty 4

## 2018-10-05 MED ORDER — HEPARIN SODIUM (PORCINE) 1000 UNIT/ML IJ SOLN
INTRAMUSCULAR | Status: AC
  Filled 2018-10-05: qty 3

## 2018-10-05 MED FILL — Fentanyl Citrate Preservative Free (PF) Inj 100 MCG/2ML: INTRAMUSCULAR | Qty: 2 | Status: AC

## 2018-10-05 NOTE — Discharge Summary (Signed)
Tami [name] KLK:[id number] DOB: [birth year] DOA: 10/03/2018  PCP: [facility]  Admit date: 10/03/2018  Discharge date: 10/05/2018  Admitted From: Home   Disposition:  Home   Recommendations for Outpatient Follow-up:   Follow up with PCP in 1-2 weeks  PCP Please obtain BMP/CBC, 2 view CXR in 1week,  (see Discharge instructions)   PCP Please follow up on the following pending results:    Home Health: None   Equipment/Devices: None  Consultations: Neuro Discharge Condition: Stable   CODE STATUS: Full   Diet Recommendation:   Renal, 1.5lit fluid/ day  Chief Complaint  Patient presents with  . Seizures     Brief history of present illness from the day of admission and additional interim summary    Tami [name] a 62 y.o.femalewithhistory of liver transplant on immunosuppressants, ESRD on hemodialysis Monday Wednesday Friday, hypertension anemia had a seizure-like episode in the medical office was witnessed by patient's nurse. As also was concern for some cardiac arrest for which patient was given CPR as per the report. No medications were given at that time. Patient was transferred to [place].  In the ER she was clinically stable with no focal deficits, EKG, troponin 3 sets, CT head and MRI brain were unremarkable.  She was seen by neuro and admitted for EEG, nephrology has also been consulted for HD.                                                                 Hospital Course    1. Possible seizures with possible cardiac arrest requiring CPR briefly- doubt she had cardiac arrest EKG, 3 sets of troponin negative, stable TTE, she actually never received any medications, had any chest pain or palpitations hence doubt she had a cardiac arrest.  She did apparently have  seizure-like activity with Bowel incontinence and post ictal state and another episode in the ER. She was seen was by neurology case DW Dr Cheral Marker 10/05/18 , MRI nonacute, EEG stable, received a dose of Keppra. Currently stable and symptom-free, no headache or focal deficits, no further seizure-like activity, per neurology no AEDs for now.  We will have her follow with PCP and neurology in 1 to 2 weeks outpatient post discharge.  Until then we will ask her to refrain from driving.  2.  Encephalopathy due to seizures.  Resolved.  3.  ESRD.  On MWF schedule, Renal was consulted and she was dialyzed.  4.  Anemia of chronic disease.  Stable.  5.  Hypertension.  Home medications resumed .  6.  History of liver and pancreatic transplant -acute issues continue home medications and outpatient follow-up as needed.   Discharge diagnosis     Principal Problem:   Seizure [facility]) Active Problems:   Anemia  of renal disease   Type 2 diabetes mellitus with end-stage renal disease ([place])   H/O liver transplant ([place])   Essential hypertension, benign   End stage renal disease on dialysis due to type 2 diabetes mellitus ([place])   Seizures ([place])    Discharge instructions    Discharge Instructions    Diet - low sodium heart healthy   Complete by:  As directed    Discharge instructions   Complete by:  As directed    Do not drive, operate heavy machinery, perform activities at heights, swimming or participation in water activities or provide baby sitting services until you have seen by Primary MD or a Neurologist and advised to do so again.  Follow with Primary MD [facility] in 7 days   Get CBC, CMP checked  by Primary MD in 5-7 days    Activity: As tolerated with Full fall precautions use walker/cane & assistance as needed  Disposition Home    Diet: Renal, 1.5lit/day fluid restriction  Special Instructions: If you have smoked or chewed Tobacco  in the last 2 yrs please stop  smoking, stop any regular Alcohol  and or any Recreational drug use.  On your next visit with your primary care physician please Get Medicines reviewed and adjusted.  Please request your Prim.MD to go over all Hospital Tests and Procedure/Radiological results at the follow up, please get all Hospital records sent to your Prim MD by signing hospital release before you go home.  If you experience worsening of your admission symptoms, develop shortness of breath, life threatening emergency, suicidal or homicidal thoughts you must seek medical attention immediately by calling 911 or calling your MD immediately  if symptoms less severe.  You Must read complete instructions/literature along with all the possible adverse reactions/side effects for all the Medicines you take and that have been prescribed to you. Take any new Medicines after you have completely understood and accpet all the possible adverse reactions/side effects.   Increase activity slowly   Complete by:  As directed       Discharge Medications   Allergies as of 10/05/2018      Reactions   Iodinated Diagnostic Agents    UNSPECIFIED REACTION    Ioxaglate    UNSPECIFIED REACTION    Latex    UNSPECIFIED REACTION       Medication List    TAKE these medications   albuterol 108 (90 Base) MCG/ACT inhaler Commonly known as:  PROVENTIL HFA;VENTOLIN HFA Inhale 2 puffs into the lungs every 6 (six) hours as needed.   alendronate 70 MG tablet Commonly known as:  FOSAMAX Take 70 mg by mouth once a week. Give on Monday's   allopurinol 100 MG tablet Commonly known as:  ZYLOPRIM Take 100 mg by mouth daily.   ENSURE Give 120 cc by mouth two times daily   labetalol 100 MG tablet Commonly known as:  NORMODYNE Take 100 mg by mouth 2 (two) times daily.   loratadine 10 MG tablet Commonly known as:  CLARITIN Take 10 mg by mouth daily. X 10 days   NEORAL 25 MG capsule Generic drug:  cycloSPORINE modified Take 50 mg by mouth 2  (two) times daily.   ondansetron 4 MG tablet Commonly known as:  ZOFRAN Take 4 mg by mouth every 6 (six) hours as needed for nausea or vomiting.   Oxycodone HCl 10 MG Tabs Take 1 tablet (10 mg total) by mouth every 6 (six) hours as needed for up  to 10 doses (severe pain). - hold for sedation   Pancrelipase (Lip-Prot-Amyl) 24000-76000 units Cpep Give 1 capsule by mouth with meals   pantoprazole 40 MG tablet Commonly known as:  PROTONIX Take 40 mg by mouth 2 (two) times daily.   zolpidem 5 MG tablet Commonly known as:  AMBIEN Take 1 tablet (5 mg total) by mouth at bedtime. What changed:    when to take this  reasons to take this       [place]. Schedule an appointment as soon as possible for a visit in 1 week(s).   Contact information: [place] [address] [place]. Schedule an appointment as soon as possible for a visit in 1 week(s).   Why:  sizure Contact information: [address]     [place] [address] [phone]          Major procedures and Radiology Reports - PLEASE review detailed and final reports thoroughly  -     TTE   - LVEF 60-65%, mild LVH, normal wall motion, grade 1 DD, elevated LV filling pressure, upper normal LA size, mild TR, RVSP 43 mmHg,  normal IVC.   EEG  IMPRESSION:  Normal electroencephalogram, awake and drowsy. There are no focal lateralizing or epileptiform features.  Ct Head Wo Contrast  Result Date: 10/03/2018 CLINICAL DATA:  Seizure EXAM: CT HEAD WITHOUT CONTRAST TECHNIQUE: Contiguous axial images were obtained from the base of the skull through the vertex without intravenous contrast. COMPARISON:  None. FINDINGS: Brain: There is no mass, hemorrhage or extra-axial collection. The size and configuration of the ventricles and extra-axial CSF spaces are normal. There is hypoattenuation of the  periventricular white matter, most commonly indicating chronic ischemic microangiopathy. Vascular: No abnormal hyperdensity of the major intracranial arteries or dural venous sinuses. No intracranial atherosclerosis. Skull: The visualized skull base, calvarium and extracranial soft tissues are normal. Sinuses/Orbits: No fluid levels or advanced mucosal thickening of the visualized paranasal sinuses. No mastoid or middle ear effusion. The orbits are normal. IMPRESSION: Mild chronic small vessel disease without acute intracranial abnormality. Electronically Signed   By: Ulyses Jarred M.D.   On: 10/03/2018 15:10   Mr Jodene Nam Head Wo Contrast  Result Date: 10/03/2018 CLINICAL DATA:  Seizure activity, 2 minutes episode of cardiac arrest. No history of seizures. History of autoimmune hepatitis, status post liver transplant. EXAM: MRI HEAD WITHOUT CONTRAST MRA HEAD WITHOUT CONTRAST TECHNIQUE: Multiplanar, multiecho pulse sequences of the brain and surrounding structures were obtained without intravenous contrast. Angiographic images of the head were obtained using MRA technique without contrast. COMPARISON:  CT HEAD October 03, 2018 FINDINGS: MRI HEAD FINDINGS INTRACRANIAL CONTENTS: No reduced diffusion to suggest acute ischemia. No susceptibility artifact to suggest hemorrhage. The ventricles and sulci are normal for patient's age. Scattered subcentimeter supratentorial white matter FLAIR T2 hyperintensities. Hazy T2 hyperintense signal within the pons. 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 Mildly prominent basal ganglia perivascular spaces associated with chronic small vessel ischemic changes. No suspicious parenchymal signal, masses, mass effect. No abnormal extra-axial fluid collections. No extra-axial masses. Normal symmetric hippocampal size, morphology and signal. VASCULAR: Normal major intracranial vascular flow voids present at skull base. SKULL AND UPPER CERVICAL SPINE: No abnormal sellar expansion. No  suspicious calvarial bone marrow signal. Severe LEFT temporomandibular osteoarthrosis. Craniocervical junction maintained. SINUSES/ORBITS: The mastoid air-cells and included paranasal sinuses are well-aerated.The included ocular globes and  orbital contents are non-suspicious. Status post bilateral ocular lens implants. OTHER: Patient is edentulous. MRA HEAD FINDINGS ANTERIOR CIRCULATION: Flow related enhancement of the included cervical, petrous, cavernous and supraclinoid internal carotid arteries. Luminal irregularity bilateral internal carotid arteries corresponding to calcific atherosclerosis on today's CT; moderate RIGHT and severe LEFT paraclinoid stenosis. Patent anterior communicating artery. Patent anterior and middle cerebral arteries. No large vessel occlusion, aneurysm. POSTERIOR CIRCULATION: Codominant vertebral arteries. Vertebrobasilar arteries are patent, with normal flow related enhancement of the main branch vessels. Patent posterior cerebral arteries. Small LEFT posterior communicating artery present. No large vessel occlusion, flow limiting stenosis,  aneurysm. ANATOMIC VARIANTS: None. Source images and MIP images were reviewed. IMPRESSION: MRI HEAD: 1. No acute intracranial process. 2. Mild chronic small vessel ischemic changes. Old RIGHT basal ganglia lacunar infarct. MRA HEAD: 1. No emergent large vessel occlusion. 2. Severe LEFT and moderate RIGHT ICA stenosis. Electronically Signed   By: Elon Alas M.D.   On: 10/03/2018 22:52   Mr Brain Wo Contrast  Result Date: 10/03/2018 CLINICAL DATA:  Seizure activity, 2 minutes episode of cardiac arrest. No history of seizures. History of autoimmune hepatitis, status post liver transplant. EXAM: MRI HEAD WITHOUT CONTRAST MRA HEAD WITHOUT CONTRAST TECHNIQUE: Multiplanar, multiecho pulse sequences of the brain and surrounding structures were obtained without intravenous contrast. Angiographic images of the head were obtained using MRA  technique without contrast. COMPARISON:  CT HEAD October 03, 2018 FINDINGS: MRI HEAD FINDINGS INTRACRANIAL CONTENTS: No reduced diffusion to suggest acute ischemia. No susceptibility artifact to suggest hemorrhage. The ventricles and sulci are normal for patient's age. Scattered subcentimeter supratentorial white matter FLAIR T2 hyperintensities. Hazy T2 hyperintense signal within the pons. 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 Mildly prominent basal ganglia perivascular spaces associated with chronic small vessel ischemic changes. No suspicious parenchymal signal, masses, mass effect. No abnormal extra-axial fluid collections. No extra-axial masses. Normal symmetric hippocampal size, morphology and signal. VASCULAR: Normal major intracranial vascular flow voids present at skull base. SKULL AND UPPER CERVICAL SPINE: No abnormal sellar expansion. No suspicious calvarial bone marrow signal. Severe LEFT temporomandibular osteoarthrosis. Craniocervical junction maintained. SINUSES/ORBITS: The mastoid air-cells and included paranasal sinuses are well-aerated.The included ocular globes and orbital contents are non-suspicious. Status post bilateral ocular lens implants. OTHER: Patient is edentulous. MRA HEAD FINDINGS ANTERIOR CIRCULATION: Flow related enhancement of the included cervical, petrous, cavernous and supraclinoid internal carotid arteries. Luminal irregularity bilateral internal carotid arteries corresponding to calcific atherosclerosis on today's CT; moderate RIGHT and severe LEFT paraclinoid stenosis. Patent anterior communicating artery. Patent anterior and middle cerebral arteries. No large vessel occlusion, aneurysm. POSTERIOR CIRCULATION: Codominant vertebral arteries. Vertebrobasilar arteries are patent, with normal flow related enhancement of the main branch vessels. Patent posterior cerebral arteries. Small LEFT posterior communicating artery present. No large vessel occlusion, flow limiting  stenosis,  aneurysm. ANATOMIC VARIANTS: None. Source images and MIP images were reviewed. IMPRESSION: MRI HEAD: 1. No acute intracranial process. 2. Mild chronic small vessel ischemic changes. Old RIGHT basal ganglia lacunar infarct. MRA HEAD: 1. No emergent large vessel occlusion. 2. Severe LEFT and moderate RIGHT ICA stenosis. Electronically Signed   By: Elon Alas M.D.   On: 10/03/2018 22:52   Dg Chest Port 1 View  Result Date: 10/03/2018 CLINICAL DATA:  Cardiac arrest. EXAM: PORTABLE CHEST 1 VIEW COMPARISON:  Chest x-ray dated July 06, 2018. FINDINGS: Unchanged tunneled right internal jugular dialysis catheter with the tip in the proximal right atrium. Stable cardiomegaly. Persistent low lung volumes with bibasilar atelectasis and bronchovascular  crowding. Slightly coarsened interstitial markings are similar to prior study. No focal consolidation, pleural effusion, or pneumothorax. No acute osseous abnormality. IMPRESSION: Stable chest. Poor inspiration with bibasilar atelectasis. No active disease. Electronically Signed   By: Titus Dubin M.D.   On: 10/03/2018 15:08   Vas [name] Upper Extremity Arterial Duplex  Result Date: 09/25/2018 UPPER EXTREMITY DUPLEX STUDY Indications: New access. History:     ESRD.  Risk Factors: Hypertension, Diabetes. Performing Technologist: Ronal Fear RVS, RCS  Examination Guidelines: A complete evaluation includes B-mode imaging, spectral Doppler, color Doppler, and power Doppler as needed of all accessible portions of each vessel. Bilateral testing is considered an integral part of a complete examination. Limited examinations for reoccurring indications may be performed as noted.  Right Pre-Dialysis Findings: +-----------------------+----------+--------------------+---------+--------+ Location               PSV (cm/s)Intralum. Diam. (cm)Waveform Comments +-----------------------+----------+--------------------+---------+--------+ Brachial Antecub.  [id number]        0.39                triphasic         +-----------------------+----------+--------------------+---------+--------+ Radial Art at Wrist    81        0.19                triphasic         +-----------------------+----------+--------------------+---------+--------+ Ulnar Art at Wrist     32        0.23                triphasic         +-----------------------+----------+--------------------+---------+--------+ Left Pre-Dialysis Findings: +-----------------------+----------+--------------------+---------+---------+ Location               PSV (cm/s)Intralum. Diam. (cm)Waveform Comments  +-----------------------+----------+--------------------+---------+---------+ Brachial Antecub. [id number]        0.38                triphasic          +-----------------------+----------+--------------------+---------+---------+ Radial Art at Wrist    55        0.19                triphasic          +-----------------------+----------+--------------------+---------+---------+ Ulnar Art at Wrist     24        0.16                triphasiccalcified +-----------------------+----------+--------------------+---------+---------+  Summary:  Right: No obstruction visualized in the right upper extremity. Left: No obstruction visualized in the left upper extremity. *See table(s) above for measurements and observations. Electronically signed by Curt Jews MD on 09/25/2018 at 2:04:00 PM.    Final    Vas [name] Upper Ext Vein Mapping (pre-op Avf)  Result Date: 09/25/2018 UPPER EXTREMITY VEIN MAPPING  Indications: Pre-access. History: ESRD.  Performing Technologist: Ronal Fear RVS, RCS  Examination Guidelines: A complete evaluation includes B-mode imaging, spectral Doppler, color Doppler, and power Doppler as needed of all accessible portions of each vessel. Bilateral testing is considered an integral part of a complete examination. Limited examinations for reoccurring indications may be  performed as noted. +-----------------+-------------+----------+--------+ Right Cephalic   Diameter (cm)Depth (cm)Findings +-----------------+-------------+----------+--------+ Prox upper arm       0.23                        +-----------------+-------------+----------+--------+ Mid upper arm        0.12                        +-----------------+-------------+----------+--------+  Dist upper arm       0.11                        +-----------------+-------------+----------+--------+ Antecubital fossa    0.20                        +-----------------+-------------+----------+--------+ Prox forearm         0.10                        +-----------------+-------------+----------+--------+ Mid forearm          0.10                        +-----------------+-------------+----------+--------+ Dist forearm         0.12                        +-----------------+-------------+----------+--------+ +-----------------+-------------+----------+--------+ Right Basilic    Diameter (cm)Depth (cm)Findings +-----------------+-------------+----------+--------+ Mid upper arm        0.58                        +-----------------+-------------+----------+--------+ Dist upper arm       0.46                        +-----------------+-------------+----------+--------+ Antecubital fossa    0.36                        +-----------------+-------------+----------+--------+ Prox forearm         0.22                        +-----------------+-------------+----------+--------+ +-----------------+-------------+----------+--------+ Left Cephalic    Diameter (cm)Depth (cm)Findings +-----------------+-------------+----------+--------+ Shoulder             0.33                        +-----------------+-------------+----------+--------+ Prox upper arm       0.31                        +-----------------+-------------+----------+--------+ Mid upper arm        0.29                         +-----------------+-------------+----------+--------+ Dist upper arm       0.33                        +-----------------+-------------+----------+--------+ Antecubital fossa    0.36                        +-----------------+-------------+----------+--------+ Prox forearm         0.39                        +-----------------+-------------+----------+--------+ Mid forearm          0.34                        +-----------------+-------------+----------+--------+ Dist forearm         0.30                        +-----------------+-------------+----------+--------+ +-----------------+-------------+----------+--------+  Left Basilic     Diameter (cm)Depth (cm)Findings +-----------------+-------------+----------+--------+ Prox upper arm       0.68                        +-----------------+-------------+----------+--------+ Mid upper arm        0.47                        +-----------------+-------------+----------+--------+ Dist upper arm       0.47                        +-----------------+-------------+----------+--------+ Antecubital fossa    0.46                        +-----------------+-------------+----------+--------+ Prox forearm         0.36                        +-----------------+-------------+----------+--------+ Summary: Right: Patent and compressible cephalic and basilic veins. Left: Patent and compressible cephalic and basilic veins. *See table(s) above for measurements and observations.  Diagnosing physician: Curt Jews MD Electronically signed by Curt Jews MD on 09/25/2018 at 2:04:10 PM.    Final     Micro Results    Recent Results (from the past 240 hour(s))  MRSA PCR Screening     Status: None   Collection Time: 10/04/18  6:23 PM  Result Value Ref Range Status   MRSA by PCR NEGATIVE NEGATIVE Final    Comment:        The GeneXpert MRSA Assay (FDA approved for NASAL specimens only), is one component of a comprehensive  MRSA colonization surveillance program. It is not intended to diagnose MRSA infection nor to guide or monitor treatment for MRSA infections. Performed at [facility], [place] [address], [place], [place] [address]     Today   Subjective    Tami [name] today has no headache,no chest abdominal pain,no new weakness tingling or numbness, feels much better wants to go home today.     Objective   Blood pressure 129/71, pulse 95, temperature 98.5 F (36.9 C), resp. rate 18, height 5\' 1"  (1.549 m), weight 65.1 kg, SpO2 94 %.   Intake/Output Summary (Last 24 hours) at 10/05/2018 1309 Last data filed at 10/04/2018 2220 Gross per 24 hour  Intake -  Output 1505 ml  Net -1505 ml    Exam Awake Alert, Oriented x 3, No new F.N deficits, Normal affect [place].AT,PERRAL Supple Neck,No JVD, No cervical lymphadenopathy appriciated.  Symmetrical Chest wall movement, Good air movement bilaterally, CTAB RRR,No Gallops,Rubs or new Murmurs, No Parasternal Heave +ve B.Sounds, Abd Soft, Non tender, No organomegaly appriciated, No rebound -guarding or rigidity. No Cyanosis, Clubbing or edema, No new Rash or bruise   Data Review   CBC w Diff:  Lab Results  Component Value Date   WBC 2.9 (L) 10/05/2018   HGB 9.0 (L) 10/05/2018   HGB 9.6 (L) 10/03/2018   HGB 9.0 (L) 10/13/2017   HCT 28.7 (L) 10/05/2018   HCT 27.5 (L) 10/13/2017   PLT 134 (L) 10/05/2018   PLT 166 10/03/2018   PLT 124 (L) 10/13/2017   LYMPHOPCT 25 10/04/2018   LYMPHOPCT 23.8 10/13/2017   MONOPCT 11 10/04/2018   MONOPCT 9.6 10/13/2017   EOSPCT 4 10/04/2018   EOSPCT 3.5 10/13/2017   BASOPCT 0 10/04/2018   BASOPCT  0.2 10/13/2017    CMP:  Lab Results  Component Value Date   NA 137 10/05/2018   NA 137 08/10/2018   NA 140 10/13/2017   NA 138 04/11/2017   K 3.8 10/05/2018   K 5.3 no visable hemolysis (H) 10/13/2017   K 5.2 No visable hemolysis (H) 04/11/2017   CL 98 10/05/2018   CL 104 10/13/2017   CO2 28 10/05/2018    CO2 17 (L) 10/13/2017   CO2 19 (L) 04/11/2017   BUN 8 10/05/2018   BUN 1 (A) 08/10/2018   BUN 58 (H) 10/13/2017   BUN 33.4 (H) 04/11/2017   CREATININE 2.82 (H) 10/05/2018   CREATININE 5.16 (HH) 10/03/2018   CREATININE 5.9 (HH) 10/13/2017   CREATININE 1.8 (H) 04/11/2017   GLU 92 08/10/2018   PROT 6.5 10/04/2018   PROT 7.7 10/13/2017   PROT 8.8 (H) 04/11/2017   ALBUMIN 2.0 (L) 10/04/2018   ALBUMIN 3.3 10/13/2017   ALBUMIN 3.7 04/11/2017   BILITOT 0.4 10/04/2018   BILITOT 0.4 10/03/2018   BILITOT 0.54 04/11/2017   ALKPHOS 66 10/04/2018   ALKPHOS 68 10/13/2017   ALKPHOS 80 04/11/2017   AST 17 10/04/2018   AST 13 (L) 10/03/2018   AST 16 04/11/2017   ALT <5 10/04/2018   ALT 1 10/03/2018   ALT 15 10/13/2017   ALT 8 04/11/2017  .   Total Time in preparing paper work, data evaluation and todays exam - 46 minutes  Lala Lund M.D on 10/05/2018 at 1:09 PM  [facility]   Office  [phone]

## 2018-10-05 NOTE — Progress Notes (Signed)
EEG Completed; Results Pending  

## 2018-10-05 NOTE — Discharge Instructions (Signed)
Do not drive, operate heavy machinery, perform activities at heights, swimming or participation in water activities or provide baby sitting services until you have seen by Primary MD or a Neurologist and advised to do so again.  Follow with Primary [facility], [place] [facility] in 7 days   Get CBC, CMP checked  by Primary MD in 5-7 days    Activity: As tolerated with Full fall precautions use walker/cane & assistance as needed  Disposition Home    Diet: Renal, 1.5lit/day fluid restriction  Special Instructions: If you have smoked or chewed Tobacco  in the last 2 yrs please stop smoking, stop any regular Alcohol  and or any Recreational drug use.  On your next visit with your primary care physician please Get Medicines reviewed and adjusted.  Please request your Prim.MD to go over all Hospital Tests and Procedure/Radiological results at the follow up, please get all Hospital records sent to your Prim MD by signing hospital release before you go home.  If you experience worsening of your admission symptoms, develop shortness of breath, life threatening emergency, suicidal or homicidal thoughts you must seek medical attention immediately by calling 911 or calling your MD immediately  if symptoms less severe.  You Must read complete instructions/literature along with all the possible adverse reactions/side effects for all the Medicines you take and that have been prescribed to you. Take any new Medicines after you have completely understood and accpet all the possible adverse reactions/side effects.

## 2018-10-05 NOTE — Procedures (Signed)
Pt on HD, no further seizures.  Possibly for dc today after HD.  Pt stable, minimal volume, UF 1 L on HD today.   I was present at this dialysis session, have reviewed the session itself and made  appropriate changes Kelly Splinter MD [place] pager [phone]   10/05/2018, 3:15 PM

## 2018-10-05 NOTE — Progress Notes (Signed)
Patient awaiting inpatient hemodialysis before discharge. Attempting to get patient to discharge lounge. Patient has received discharge paperwork and IV removed. Patient able to verbalize understanding.

## 2018-10-05 NOTE — Progress Notes (Signed)
EEG has been ordered.   Electronically signed: Dr. Kerney Elbe

## 2018-10-05 NOTE — Procedures (Signed)
ELECTROENCEPHALOGRAM REPORT   Patient: Tami [name]       Room #: 2Q20U EEG No. ID: [id number] Age: 62 y.o.        Sex: female Referring Physician: Cheral Marker Report Date:  10/05/2018        Interpreting Physician: Alexis Goodell  History: [name] is an 62 y.o. female with new onset seizures  Medications:  Cyclosporine, Labetalol, Creon  Conditions of Recording:  This is a 21 channel routine scalp EEG performed with bipolar and monopolar montages arranged in accordance to the international 10/20 system of electrode placement. One channel was dedicated to EKG recording.  The patient is in the awake and drowsy states.  Description:  The waking background activity consists of a low voltage, symmetrical, fairly well organized, 9 Hz alpha activity, seen from the parieto-occipital and posterior temporal regions.  Low voltage fast activity, poorly organized, is seen anteriorly and is at times superimposed on more posterior regions.  A mixture of theta and alpha rhythms are seen from the central and temporal regions. The patient drowses with slowing to irregular, low voltage theta and beta activity.   Stage II sleep is not obtained. No epileptiform activity is noted.   Hyperventilation and intermittent photic stimulation were not performed.   IMPRESSION: Normal electroencephalogram, awake and drowsy. There are no focal lateralizing or epileptiform features.   Alexis Goodell, MD Neurology [phone] 10/05/2018, 12:39 PM

## 2018-10-06 LAB — HEPATITIS B SURFACE ANTIGEN: HEP B S AG: NEGATIVE

## 2018-10-10 MED ORDER — ENSURE ORAL LIQUID
ORAL | 0 days
Start: 2018-10-10 — End: ?

## 2018-10-10 MED ORDER — PANCRELIPASE 16,000 ORAL
0 days
Start: 2018-10-10 — End: ?

## 2018-10-19 NOTE — Unmapped (Signed)
Was return via [employer] and resending back out after patient has called and confirm address. EXpected delivery date 10/22/18.

## 2018-10-19 NOTE — Unmapped (Signed)
Called patient and requested a call back. Left phone number and message, trying to reach patient asking about recent d/c from nursing home and requesting labs.

## 2018-10-22 ENCOUNTER — Ambulatory Visit: Payer: Medicaid Other | Admitting: Diagnostic Neuroimaging

## 2018-10-22 ENCOUNTER — Encounter: Payer: Self-pay | Admitting: Diagnostic Neuroimaging

## 2018-10-22 ENCOUNTER — Telehealth: Payer: Self-pay | Admitting: *Deleted

## 2018-10-22 NOTE — Telephone Encounter (Signed)
Patient called this morning and cancelled her new pt appointment today, stated she had no transportation.

## 2018-10-25 NOTE — Unmapped (Signed)
Pt called to update TNC on her recent discharge and give [name] an update, since she was d/c from the nursing home early November and has been bounced back and forth to different hospitals from July to [place]. Pt. Due for labs, lab orders sent to diaylsis center, TNC confirmed with center, faxed and sent. Pt. Confirms she has her Immunosuppression medication. And verbalizes labs need to be collected.   Pt states she is requesting a letter, but TNC will update patient she need to reach out to the recent discharge facility for this letter and/or the recent hospitals she been admitted with for her missed court date.     questions and concerns addressed.

## 2018-10-30 NOTE — Unmapped (Signed)
Spoke with the patient regarding her request for a letter to state her Hospital admission dates over the course of the last few months. She requested it for a missed court date.  After discussing with the patient, TNC noted that she should be getting this letter from the social worker at the nursing home she was recently discharged from.   Pt. Verbalized understanding.   Also noted she is only available Tuesday/Thursday for her upcoming annual in March.   Note sent to TPA for scheduling needs.

## 2018-11-07 ENCOUNTER — Other Ambulatory Visit: Payer: Self-pay

## 2018-11-07 DIAGNOSIS — N186 End stage renal disease: Secondary | ICD-10-CM

## 2018-11-14 ENCOUNTER — Encounter (HOSPITAL_COMMUNITY): Payer: Medicaid Other

## 2018-11-15 ENCOUNTER — Inpatient Hospital Stay (HOSPITAL_COMMUNITY): Admission: RE | Admit: 2018-11-15 | Payer: Medicaid Other | Source: Ambulatory Visit

## 2018-11-15 NOTE — Unmapped (Addendum)
Need to follow up with patient about getting labs  Re-sent letter to patients home requesting getting labs and following up with transplant coordinator.

## 2018-11-21 LAB — CBC
HEMATOCRIT: 29.4 % — AB
HEMATOCRIT: 30.6 % — AB
HEMATOCRIT: 32.4 % — AB
HEMOGLOBIN: 10.1 g/dL — AB
HEMOGLOBIN: 10.2 g/dL — AB
HEMOGLOBIN: 10.8 g/dL — AB
PLATELET COUNT: 149 10*9/L
WBC ADJUSTED: 4 10*9/L — AB

## 2018-11-21 LAB — BASIC METABOLIC PANEL
BLOOD UREA NITROGEN: 26 mg/dL — AB
CHLORIDE: 96 mmol/L
CO2: 25 mmol/L
CREATININE: 5.89 mg/dL — AB
SODIUM: 139 mmol/L

## 2018-11-21 LAB — SODIUM: Lab: 0

## 2018-11-21 LAB — MEAN PLATELET VOLUME
Lab: 0
Lab: 0

## 2018-11-21 LAB — RED BLOOD CELL COUNT: Lab: 0

## 2018-11-21 LAB — MAGNESIUM: Lab: 1.6

## 2018-11-21 LAB — EGFR MDRD AF AMER: Lab: 0

## 2018-11-21 LAB — ALBUMIN: Lab: 3.3 — AB

## 2018-11-21 LAB — PHOSPHORUS: Lab: 6.4 — AB

## 2018-11-21 LAB — CREATININE: Lab: 0

## 2018-11-21 LAB — PARATHYROID HORMONE INTACT: Lab: 770 — AB

## 2018-11-21 LAB — FERRITIN: Lab: 1007 — AB

## 2018-11-21 LAB — IRON & TIBC: TRANSFERRIN: 17 mg/dL — AB

## 2018-11-21 LAB — HEPATITIS B SURFACE ANTIGEN
HEPATITIS B SURFACE ANTIGEN: NONREACTIVE
Lab: NONREACTIVE
Lab: NONREACTIVE

## 2018-11-21 LAB — IRON SATURATION: Lab: 0

## 2018-11-21 LAB — HEMATOCRIT: Lab: 30.3 — AB

## 2018-11-21 NOTE — Unmapped (Signed)
Received on call page from patient's dialysis center @ [phone] noting limited ability to complete full lab order sent to them via Epic letter on 1/2. Given patient has not had labs drawn since October dialysis center will proceed with sending results for labs they can complete.  Dialysis RN mentions having transplant mailer tubes at their facility to obtain cycslosporine trough level with patient's visit this Friday 1/31 - confirmed it should be a trough level collection.  She does however note inability to process AST, ALT, dbili on hepatic panel.  Will forward other results and have primary coordinator reach out to patient for other lab arrangements as needed.  Confirmed for dialysis RN if additional mailer supply needed once exhaustion of current supply she can contact primary coordinator.

## 2018-11-21 NOTE — Unmapped (Signed)
Received pt's labs by fax and some were illegible due to highlighting. Called [employer], spoke to [place]  who clarified the illegible numbers.

## 2018-11-22 NOTE — Unmapped (Signed)
[facility] Refill Coordination Note    Specialty Medication(s) to be Shipped:   Transplant: Neoral 25mg     Other medication(s) to be shipped: none     Katherine Norton, DOB: 10/13/1956  Phone: [phone] (home)       All above HIPAA information was verified with patient.     Completed refill call assessment today to schedule patient's medication shipment from the [facility] [phone]).       Specialty medication(s) and dose(s) confirmed: Regimen is correct and unchanged.   Changes to medications: [name] reports no changes reported at this time.  Changes to insurance: No  Questions for the pharmacist: No    The patient will receive a drug information handout for each medication shipped and additional FDA Medication Guides as required.      DISEASE/MEDICATION-SPECIFIC INFORMATION        N/A    ADHERENCE     Medication Adherence    Patient reported X missed doses in the last month:  0              MEDICARE PART B DOCUMENTATION     Neoral 25mg : Patient has 7 days worth of capsules on hand.    SHIPPING     Shipping address confirmed in Epic.     Delivery Scheduled: Yes, Expected medication delivery date: 11/28/18 via UPS or courier.     Medication will be delivered via UPS to the home address in Epic WAM.    [name]   [facility] Specialty Technician

## 2018-11-23 ENCOUNTER — Encounter: Payer: Self-pay | Admitting: Vascular Surgery

## 2018-11-23 ENCOUNTER — Ambulatory Visit (INDEPENDENT_AMBULATORY_CARE_PROVIDER_SITE_OTHER): Payer: Self-pay | Admitting: Physician Assistant

## 2018-11-23 ENCOUNTER — Other Ambulatory Visit: Payer: Self-pay

## 2018-11-23 ENCOUNTER — Other Ambulatory Visit: Payer: Self-pay | Admitting: Vascular Surgery

## 2018-11-23 ENCOUNTER — Ambulatory Visit (HOSPITAL_COMMUNITY)
Admission: RE | Admit: 2018-11-23 | Discharge: 2018-11-23 | Disposition: A | Discharge status: Home / Self Care | Payer: Medicaid Other | Source: Ambulatory Visit | Attending: Vascular Surgery | Admitting: Vascular Surgery

## 2018-11-23 ENCOUNTER — Encounter: Payer: Self-pay | Admitting: Family

## 2018-11-23 VITALS — BP 122/80 | HR 75 | Temp 97.5°F | Resp 14 | Ht 60.0 in | Wt 135.0 lb

## 2018-11-23 DIAGNOSIS — Z992 Dependence on renal dialysis: Secondary | ICD-10-CM

## 2018-11-23 DIAGNOSIS — N186 End stage renal disease: Secondary | ICD-10-CM

## 2018-11-23 DIAGNOSIS — E1122 Type 2 diabetes mellitus with diabetic chronic kidney disease: Secondary | ICD-10-CM

## 2018-11-23 NOTE — Progress Notes (Signed)
Established Dialysis Access   History of Present Illness   Tami [name] is a 63 y.o. (06/04/1956) female who presents status post left brachiocephalic fistula creation by Dr. Donzetta Matters on 10/02/2018.  She has been on hemodialysis via right IJ tunneled dialysis catheter since September and she is dialyzing on a Monday Wednesday Friday schedule.  She denies any trouble healing her antecubital incision.  She also denies any signs or symptoms of a steal syndrome.  She denies any chest pain or shortness of breath.  She is not taking any blood thinners.  The patient's PMH, PSH, SH, and FamHx were reviewed and are unchanged from prior visit.  Current Outpatient Medications  Medication Sig Dispense Refill  . albuterol (PROVENTIL HFA;VENTOLIN HFA) 108 (90 Base) MCG/ACT inhaler Inhale 2 puffs into the lungs every 6 (six) hours as needed.     Marland Kitchen alendronate (FOSAMAX) 70 MG tablet Take 70 mg by mouth once a week. Give on Monday's  1  . allopurinol (ZYLOPRIM) 100 MG tablet Take 100 mg by mouth daily.  5  . cycloSPORINE modified (NEORAL) 25 MG capsule Take 50 mg by mouth 2 (two) times daily.     Marland Kitchen ENSURE (ENSURE) Give 120 cc by mouth two times daily    . labetalol (NORMODYNE) 100 MG tablet Take 100 mg by mouth 2 (two) times daily.    . ondansetron (ZOFRAN) 4 MG tablet Take 4 mg by mouth every 6 (six) hours as needed for nausea or vomiting.    . Oxycodone HCl 10 MG TABS Take 1 tablet (10 mg total) by mouth every 6 (six) hours as needed for up to 10 doses (severe pain). - hold for sedation 10 tablet 0  . Pancrelipase, Lip-Prot-Amyl, 24000-76000 units CPEP Give 1 capsule by mouth with meals    . pantoprazole (PROTONIX) 40 MG tablet Take 40 mg by mouth 2 (two) times daily.    Marland Kitchen zolpidem (AMBIEN) 5 MG tablet Take 1 tablet (5 mg total) by mouth at bedtime. (Patient taking differently: Take 5 mg by mouth at bedtime as needed for sleep. ) 30 tablet 0  . loratadine (CLARITIN) 10 MG tablet Take 10 mg by mouth daily. X  10 days     No current facility-administered medications for this visit.     On ROS today: 10 system ROS negative unless otherwise noted in HPI   Physical Examination   Vitals:   11/23/18 1023  BP: 122/80  Pulse: 75  Resp: 14  Temp: (!) 97.5 F (36.4 C)  TempSrc: Oral  SpO2: 99%  Weight: 135 lb (61.2 kg)  Height: 5' (1.524 m)   Body mass index is 26.37 kg/m.  General Alert, O x 3, WD  Pulmonary Sym exp, good B air movt, CTA B  Cardiac RRR, Nl S1, S2,  Vascular Vessel Right Left  Radial Palpable Palpable  Brachial Palpable Palpable  Ulnar Not palpable Not palpable    Musculo- skeletal M/S 5/5 throughout  , Extremities without ischemic changes    Neurologic A&O; CN grossly intact     Non-invasive Vascular Imaging   Left arm fistula duplex demonstrates immature fistula in diameter however from mid to upper arm the path of fistula runs greater than a centimeter from the surface of the skin   Medical Decision Making   Tami [name] is a 63 y.o. female who presents with ESRD requiring hemodialysis.    Patent fistula without signs or symptoms of a steal syndrome  Fistula is  easily palpable near the antecubitum however the path of the fistula dives deeper from the distal upper arm all the way to the shoulder  There is a narrow stick zone which would make it difficult to rotate cannulation sites  Plan will be for left arm AV fistula translocation versus superficialization by Dr. Donzetta Matters in the next few weeks on a nondialysis day Risk, benefits, and alternatives to access surgery were discussed.   The patient is aware the risks include but are not limited to: bleeding, infection, steal syndrome, nerve damage, thrombosis, failure to mature, and need for additional procedures.   The patient agrees to proceed forward with the procedure.   Dagoberto Ligas PA-C [facility] [place] Office: [phone]

## 2018-11-27 ENCOUNTER — Encounter (HOSPITAL_COMMUNITY): Payer: Self-pay | Admitting: *Deleted

## 2018-11-27 MED FILL — NEORAL 25 MG CAPSULE: 30 days supply | Qty: 120 | Fill #2

## 2018-11-27 MED FILL — NEORAL 25 MG CAPSULE: 30 days supply | Qty: 120 | Fill #2 | Status: AC

## 2018-11-27 NOTE — Progress Notes (Signed)
Patient denies chest pain or shortness of breath. Denies cardiology visit reports only cardiac test were completed in December when she was in the hospital.

## 2018-11-27 NOTE — Anesthesia Preprocedure Evaluation (Addendum)
Anesthesia Evaluation  Patient identified by MRN, date of birth, ID band Patient awake    Reviewed: Allergy & Precautions, NPO status , Patient's Chart, lab work & pertinent test results, reviewed documented beta blocker date and time   Airway Mallampati: II  TM Distance: >3 FB Neck ROM: Full    Dental  (+) Dental Advisory Given, Edentulous Lower, Edentulous Upper   Pulmonary neg pulmonary ROS,    Pulmonary exam normal breath sounds clear to auscultation       Cardiovascular hypertension, Pt. on home beta blockers (-) angina(-) CAD, (-) Past MI and (-) Cardiac Stents Normal cardiovascular exam Rhythm:Regular Rate:Normal  ECG: SR, rate 99. RBBB  ECHO: LVEF 60-65%, mild LVH, normal wall motion, grade 1 DD, elevated LV filling pressure, upper normal LA size, mild TR, RVSP 43 mmHg, normal IVC.     Neuro/Psych Seizures -,  PSYCHIATRIC DISORDERS Anxiety Depression    GI/Hepatic negative GI ROS, (+) Hepatitis -Hepatitis, autoimmune  S/p LIVER TRANSPLANT   Endo/Other  diabetes, Type 2  Renal/GU ESRF and DialysisRenal disease (MWF; K+ 4.0)     Musculoskeletal  (+) Arthritis ,   Abdominal   Peds  Hematology Gout   Anesthesia Other Findings END-STAGE RENAL DISEASE  Reproductive/Obstetrics                           Anesthesia Physical Anesthesia Plan  ASA: IV  Anesthesia Plan:    Post-op Pain Management:    Induction: Intravenous  PONV Risk Score and Plan: 2 and Ondansetron, Midazolam and Treatment may vary due to age or medical condition  Airway Management Planned: LMA  Additional Equipment:   Intra-op Plan:   Post-operative Plan:   Informed Consent: I have reviewed the patients History and Physical, chart, labs and discussed the procedure including the risks, benefits and alternatives for the proposed anesthesia with the patient or authorized representative who has indicated his/her  understanding and acceptance.     Dental advisory given  Plan Discussed with: CRNA  Anesthesia Plan Comments:       Anesthesia Quick Evaluation

## 2018-11-29 ENCOUNTER — Telehealth: Payer: Self-pay | Admitting: Vascular Surgery

## 2018-11-29 ENCOUNTER — Other Ambulatory Visit: Payer: Self-pay

## 2018-11-29 ENCOUNTER — Encounter (HOSPITAL_COMMUNITY): Payer: Self-pay

## 2018-11-29 ENCOUNTER — Ambulatory Visit (HOSPITAL_COMMUNITY)
Admission: RE | Admit: 2018-11-29 | Discharge: 2018-11-29 | Disposition: A | Discharge status: Home / Self Care | Payer: Medicaid Other | Attending: Vascular Surgery | Admitting: Vascular Surgery

## 2018-11-29 ENCOUNTER — Encounter (HOSPITAL_COMMUNITY)
Admission: RE | Disposition: A | Discharge status: Home / Self Care | Payer: Self-pay | Source: Home / Self Care | Attending: Vascular Surgery

## 2018-11-29 ENCOUNTER — Ambulatory Visit (HOSPITAL_COMMUNITY): Payer: Medicaid Other | Admitting: Physician Assistant

## 2018-11-29 DIAGNOSIS — Z992 Dependence on renal dialysis: Secondary | ICD-10-CM | POA: Diagnosis not present

## 2018-11-29 DIAGNOSIS — N186 End stage renal disease: Secondary | ICD-10-CM | POA: Insufficient documentation

## 2018-11-29 DIAGNOSIS — T82590A Other mechanical complication of surgically created arteriovenous fistula, initial encounter: Secondary | ICD-10-CM | POA: Insufficient documentation

## 2018-11-29 DIAGNOSIS — I120 Hypertensive chronic kidney disease with stage 5 chronic kidney disease or end stage renal disease: Secondary | ICD-10-CM | POA: Diagnosis not present

## 2018-11-29 DIAGNOSIS — Y832 Surgical operation with anastomosis, bypass or graft as the cause of abnormal reaction of the patient, or of later complication, without mention of misadventure at the time of the procedure: Secondary | ICD-10-CM | POA: Insufficient documentation

## 2018-11-29 DIAGNOSIS — M109 Gout, unspecified: Secondary | ICD-10-CM | POA: Diagnosis not present

## 2018-11-29 DIAGNOSIS — Z7983 Long term (current) use of bisphosphonates: Secondary | ICD-10-CM | POA: Insufficient documentation

## 2018-11-29 DIAGNOSIS — E1122 Type 2 diabetes mellitus with diabetic chronic kidney disease: Secondary | ICD-10-CM | POA: Diagnosis not present

## 2018-11-29 DIAGNOSIS — Z79899 Other long term (current) drug therapy: Secondary | ICD-10-CM | POA: Diagnosis not present

## 2018-11-29 DIAGNOSIS — Z944 Liver transplant status: Secondary | ICD-10-CM | POA: Diagnosis not present

## 2018-11-29 DIAGNOSIS — T82898A Other specified complication of vascular prosthetic devices, implants and grafts, initial encounter: Secondary | ICD-10-CM

## 2018-11-29 HISTORY — PX: FISTULA SUPERFICIALIZATION: SHX6341

## 2018-11-29 LAB — POCT I-STAT 4, (NA,K, GLUC, HGB,HCT)
Glucose, Bld: 76 mg/dL (ref 70–99)
HCT: 42 % (ref 36.0–46.0)
Hemoglobin: 14.3 g/dL (ref 12.0–15.0)
Potassium: 4 mmol/L (ref 3.5–5.1)
Sodium: 135 mmol/L (ref 135–145)

## 2018-11-29 SURGERY — FISTULA SUPERFICIALIZATION
Anesthesia: General | Site: Arm Upper | Laterality: Left

## 2018-11-29 MED ORDER — MIDAZOLAM HCL 5 MG/5ML IJ SOLN
INTRAMUSCULAR | Status: DC | PRN
  Administered 2018-11-29: 2 mg via INTRAVENOUS

## 2018-11-29 MED ORDER — PROPOFOL 10 MG/ML IV BOLUS
INTRAVENOUS | Status: AC
  Filled 2018-11-29: qty 20

## 2018-11-29 MED ORDER — PHENYLEPHRINE 40 MCG/ML (10ML) SYRINGE FOR IV PUSH (FOR BLOOD PRESSURE SUPPORT)
PREFILLED_SYRINGE | INTRAVENOUS | Status: DC | PRN
  Administered 2018-11-29: 120 ug via INTRAVENOUS
  Administered 2018-11-29: 40 ug via INTRAVENOUS
  Administered 2018-11-29 (×3): 80 ug via INTRAVENOUS

## 2018-11-29 MED ORDER — DEXAMETHASONE SODIUM PHOSPHATE 10 MG/ML IJ SOLN
INTRAMUSCULAR | Status: DC | PRN
  Administered 2018-11-29: 10 mg via INTRAVENOUS

## 2018-11-29 MED ORDER — SODIUM CHLORIDE 0.9 % IV SOLN
INTRAVENOUS | Status: AC
  Filled 2018-11-29: qty 1.2

## 2018-11-29 MED ORDER — MIDAZOLAM HCL 2 MG/2ML IJ SOLN
INTRAMUSCULAR | Status: AC
  Filled 2018-11-29: qty 2

## 2018-11-29 MED ORDER — PROPOFOL 10 MG/ML IV BOLUS
INTRAVENOUS | Status: DC | PRN
  Administered 2018-11-29: 70 mg via INTRAVENOUS

## 2018-11-29 MED ORDER — LIDOCAINE HCL (PF) 1 % IJ SOLN
INTRAMUSCULAR | Status: AC
  Filled 2018-11-29: qty 30

## 2018-11-29 MED ORDER — OXYCODONE HCL 5 MG PO TABS
5.0000 mg | ORAL_TABLET | Freq: Once | ORAL | Status: AC
  Administered 2018-11-29: 5 mg via ORAL

## 2018-11-29 MED ORDER — FENTANYL CITRATE (PF) 100 MCG/2ML IJ SOLN
INTRAMUSCULAR | Status: DC | PRN
  Administered 2018-11-29: 50 ug via INTRAVENOUS

## 2018-11-29 MED ORDER — 0.9 % SODIUM CHLORIDE (POUR BTL) OPTIME
TOPICAL | Status: DC | PRN
  Administered 2018-11-29: 1000 mL

## 2018-11-29 MED ORDER — CEFAZOLIN SODIUM-DEXTROSE 2-4 GM/100ML-% IV SOLN
INTRAVENOUS | Status: AC
  Filled 2018-11-29: qty 100

## 2018-11-29 MED ORDER — SODIUM CHLORIDE 0.9 % IV SOLN
INTRAVENOUS | Status: DC | PRN
  Administered 2018-11-29: 50 ug/min via INTRAVENOUS

## 2018-11-29 MED ORDER — ONDANSETRON HCL 4 MG/2ML IJ SOLN
INTRAMUSCULAR | Status: AC
  Filled 2018-11-29: qty 2

## 2018-11-29 MED ORDER — DEXAMETHASONE SODIUM PHOSPHATE 10 MG/ML IJ SOLN
INTRAMUSCULAR | Status: AC
  Filled 2018-11-29: qty 1

## 2018-11-29 MED ORDER — CEFAZOLIN SODIUM-DEXTROSE 2-4 GM/100ML-% IV SOLN
2.0000 g | INTRAVENOUS | Status: AC
  Administered 2018-11-29: 2 g via INTRAVENOUS

## 2018-11-29 MED ORDER — SODIUM CHLORIDE 0.9 % IV SOLN
INTRAVENOUS | Status: DC | PRN
  Administered 2018-11-29: 500 mL

## 2018-11-29 MED ORDER — ONDANSETRON HCL 4 MG/2ML IJ SOLN
INTRAMUSCULAR | Status: DC | PRN
  Administered 2018-11-29: 4 mg via INTRAVENOUS

## 2018-11-29 MED ORDER — FENTANYL CITRATE (PF) 250 MCG/5ML IJ SOLN
INTRAMUSCULAR | Status: AC
  Filled 2018-11-29: qty 5

## 2018-11-29 MED ORDER — OXYCODONE HCL 5 MG PO TABS
ORAL_TABLET | ORAL | Status: AC
  Filled 2018-11-29: qty 1

## 2018-11-29 MED ORDER — SODIUM CHLORIDE 0.9 % IV SOLN
INTRAVENOUS | Status: DC
  Administered 2018-11-29: 10:00:00 via INTRAVENOUS

## 2018-11-29 MED ORDER — LIDOCAINE 2% (20 MG/ML) 5 ML SYRINGE
INTRAMUSCULAR | Status: DC | PRN
  Administered 2018-11-29: 60 mg via INTRAVENOUS

## 2018-11-29 SURGICAL SUPPLY — 34 items
ARMBAND PINK RESTRICT EXTREMIT (MISCELLANEOUS) ×3 IMPLANT
CANISTER SUCT 3000ML PPV (MISCELLANEOUS) ×3 IMPLANT
CLIP VESOCCLUDE MED 6/CT (CLIP) ×3 IMPLANT
CLIP VESOCCLUDE SM WIDE 6/CT (CLIP) ×3 IMPLANT
COVER PROBE W GEL 5X96 (DRAPES) ×3 IMPLANT
COVER WAND RF STERILE (DRAPES) ×3 IMPLANT
DERMABOND ADVANCED (GAUZE/BANDAGES/DRESSINGS) ×4
DERMABOND ADVANCED .7 DNX12 (GAUZE/BANDAGES/DRESSINGS) ×2 IMPLANT
ELECT REM PT RETURN 9FT ADLT (ELECTROSURGICAL) ×3
ELECTRODE REM PT RTRN 9FT ADLT (ELECTROSURGICAL) ×1 IMPLANT
GLOVE BIOGEL PI IND STRL 6.5 (GLOVE) ×4 IMPLANT
GLOVE BIOGEL PI IND STRL 7.0 (GLOVE) ×1 IMPLANT
GLOVE BIOGEL PI IND STRL 8 (GLOVE) ×1 IMPLANT
GLOVE BIOGEL PI INDICATOR 6.5 (GLOVE) ×8
GLOVE BIOGEL PI INDICATOR 7.0 (GLOVE) ×2
GLOVE BIOGEL PI INDICATOR 8 (GLOVE) ×2
GLOVE SURG SS PI 7.0 STRL IVOR (GLOVE) ×3 IMPLANT
GLOVE SURG SS PI 7.5 STRL IVOR (GLOVE) ×3 IMPLANT
GOWN STRL REUS W/ TWL LRG LVL3 (GOWN DISPOSABLE) ×2 IMPLANT
GOWN STRL REUS W/ TWL XL LVL3 (GOWN DISPOSABLE) ×1 IMPLANT
GOWN STRL REUS W/TWL LRG LVL3 (GOWN DISPOSABLE) ×4
GOWN STRL REUS W/TWL XL LVL3 (GOWN DISPOSABLE) ×2
KIT BASIN OR (CUSTOM PROCEDURE TRAY) ×3 IMPLANT
KIT TURNOVER KIT B (KITS) ×3 IMPLANT
NS IRRIG 1000ML POUR BTL (IV SOLUTION) ×3 IMPLANT
PACK CV ACCESS (CUSTOM PROCEDURE TRAY) ×3 IMPLANT
PAD ARMBOARD 7.5X6 YLW CONV (MISCELLANEOUS) ×6 IMPLANT
SUT MNCRL AB 4-0 PS2 18 (SUTURE) ×6 IMPLANT
SUT PROLENE 6 0 BV (SUTURE) ×3 IMPLANT
SUT VIC AB 3-0 SH 27 (SUTURE) ×6
SUT VIC AB 3-0 SH 27X BRD (SUTURE) ×3 IMPLANT
TOWEL GREEN STERILE (TOWEL DISPOSABLE) ×3 IMPLANT
UNDERPAD 30X30 (UNDERPADS AND DIAPERS) ×3 IMPLANT
WATER STERILE IRR 1000ML POUR (IV SOLUTION) ×3 IMPLANT

## 2018-11-29 NOTE — Op Note (Addendum)
Date: November 29, 2018  Preoperative diagnosis: Too deep to cannulate left upper extremity brachiocephalic fistula  Postoperative diagnosis: Same  Procedure: Left upper extremity brachiocephalic fistula revision with superficialization and sidebranch ligation  Surgeon: Dr. Marty Heck, MD  Assistant: Leontine Locket, PA  Indications: Patient is 63 year old female that previous underwent a left upper extremity brachiocephalic fistula by Dr. Donzetta Matters.  She was recently seen in clinic and it was noted that the fistula was deep in her mid to upper arm and there was concern about ability to cannulate in the future.  There are also several side branches noted with a smaller size vein in the upper arm.  She presents today for superficialization and fistula revision after risks and benefits discussed.  Findings: The fistula was only 3 to 5 mm deep at the [facility] fossa so we made two longitudinal skip incisions in the mid to upper arm and fully mobilized the cephalic vein here where it was deeper and it was superficialized.  There was a good thrill at completion.  Anesthesia: LMA  Details: Patient was taken to the operating room after informed consent was obtained.  He was placed on operative table in supine position.  Her left arm was prepped and draped in sterile fashion.  Performed a prep timeout to identify patient, procedure, and site.  Initially used ultrasound to identify the brachiocephalic vein and this was marked on the skin.  I made two longitudinal incisions in the mid to upper arm where the fistula was deep.  Incisions were made with 15 blade scalpel and through these two incisions we fully mobilized the cephalic vein with blunt dissection and Bovie cautery.  There were several side branches that were ligated with 4-0 silk ties and transected.  I then irrigated both surgical wounds until the effluent was clear.  I closed the subcutaneous tissue underneath the fistula with interrupted 3-0  Vicryl's.  The skin was closed with a running 4-0 Monocryl Dermabond was applied.  There was a good thrill in the fistula at the completion of the case.  Condition: Stable  Marty Heck, MD [facility] [place] Office: [phone] Pager: [place]

## 2018-11-29 NOTE — H&P (Signed)
History and Physical Interval Note:  11/29/2018 10:29 AM  Tami [name]  has presented today for surgery, with the diagnosis of END-STAGE RENAL DISEASE  The various methods of treatment have been discussed with the patient and family. After consideration of risks, benefits and other options for treatment, the patient has consented to  Procedure(s): TRANSLOCATION/FISTULA SUPERFICIALIZATION OF LEFT ARM FISTULA (Left) as a surgical intervention .  The patient's history has been reviewed, patient examined, no change in status, stable for surgery.  I have reviewed the patient's chart and labs.  Questions were answered to the patient's satisfaction.     Left arm superficialization.  Marty Heck  Established Dialysis Access   History of Present Illness   Tami [name] is a 63 y.o. (03/13/1956) female who presents status post left brachiocephalic fistula creation by Dr. Donzetta Matters on 10/02/2018.  She has been on hemodialysis via right IJ tunneled dialysis catheter since September and she is dialyzing on a Monday Wednesday Friday schedule.  She denies any trouble healing her antecubital incision.  She also denies any signs or symptoms of a steal syndrome.  She denies any chest pain or shortness of breath.  She is not taking any blood thinners.  The patient's PMH, PSH, SH, and FamHx were reviewed and are unchanged from prior visit.        Current Outpatient Medications  Medication Sig Dispense Refill  . albuterol (PROVENTIL HFA;VENTOLIN HFA) 108 (90 Base) MCG/ACT inhaler Inhale 2 puffs into the lungs every 6 (six) hours as needed.     Marland Kitchen alendronate (FOSAMAX) 70 MG tablet Take 70 mg by mouth once a week. Give on Monday's  1  . allopurinol (ZYLOPRIM) 100 MG tablet Take 100 mg by mouth daily.  5  . cycloSPORINE modified (NEORAL) 25 MG capsule Take 50 mg by mouth 2 (two) times daily.     Marland Kitchen ENSURE (ENSURE) Give 120 cc by mouth two times daily    . labetalol (NORMODYNE) 100 MG tablet Take  100 mg by mouth 2 (two) times daily.    . ondansetron (ZOFRAN) 4 MG tablet Take 4 mg by mouth every 6 (six) hours as needed for nausea or vomiting.    . Oxycodone HCl 10 MG TABS Take 1 tablet (10 mg total) by mouth every 6 (six) hours as needed for up to 10 doses (severe pain). - hold for sedation 10 tablet 0  . Pancrelipase, Lip-Prot-Amyl, 24000-76000 units CPEP Give 1 capsule by mouth with meals    . pantoprazole (PROTONIX) 40 MG tablet Take 40 mg by mouth 2 (two) times daily.    Marland Kitchen zolpidem (AMBIEN) 5 MG tablet Take 1 tablet (5 mg total) by mouth at bedtime. (Patient taking differently: Take 5 mg by mouth at bedtime as needed for sleep. ) 30 tablet 0  . loratadine (CLARITIN) 10 MG tablet Take 10 mg by mouth daily. X 10 days     No current facility-administered medications for this visit.     On ROS today: 10 system ROS negative unless otherwise noted in HPI   Physical Examination      Vitals:   11/23/18 1023  BP: 122/80  Pulse: 75  Resp: 14  Temp: (!) 97.5 F (36.4 C)  TempSrc: Oral  SpO2: 99%  Weight: 135 lb (61.2 kg)  Height: 5' (1.524 m)   Body mass index is 26.37 kg/m.  General Alert, O x 3, WD  Pulmonary Sym exp, good B air movt, CTA B  Cardiac  RRR, Nl S1, S2,  Vascular Vessel Right Left  Radial Palpable Palpable  Brachial Palpable Palpable  Ulnar Not palpable Not palpable    Musculo- skeletal M/S 5/5 throughout  , Extremities without ischemic changes    Neurologic A&O; CN grossly intact     Non-invasive Vascular Imaging   Left arm fistula duplex demonstrates immature fistula in diameter however from mid to upper arm the path of fistula runs greater than a centimeter from the surface of the skin   Medical Decision Making   Tami [name] is a 63 y.o. female who presents with ESRD requiring hemodialysis.    Patent fistula without signs or symptoms of a steal syndrome  Fistula is easily palpable near the antecubitum however the  path of the fistula dives deeper from the distal upper arm all the way to the shoulder  There is a narrow stick zone which would make it difficult to rotate cannulation sites  Plan will be for left arm AV fistula translocation versus superficialization by Dr. Donzetta Matters in the next few weeks on a nondialysis day  Risk, benefits, and alternatives to access surgery were discussed.    The patient is aware the risks include but are not limited to: bleeding, infection, steal syndrome, nerve damage, thrombosis, failure to mature, and need for additional procedures.    The patient agrees to proceed forward with the procedure.   Dagoberto Ligas PA-C [facility] [place] Office: [phone]

## 2018-11-29 NOTE — Discharge Instructions (Signed)
° °  [facility]  Discharge Instructions  AV Fistula or Graft Surgery for Dialysis Access  Please refer to the following instructions for your post-procedure care. Your surgeon or physician assistant will discuss any changes with you.  Activity  You may drive the day following your surgery, if you are comfortable and no longer taking prescription pain medication. Resume full activity as the soreness in your incision resolves.  Bathing/Showering  You may shower after you go home. Keep your incision dry for 48 hours. Do not soak in a bathtub, hot tub, or swim until the incision heals completely. You may not shower if you have a hemodialysis catheter.  Incision Care  Clean your incision with mild soap and water after 48 hours. Pat the area dry with a clean towel. You do not need a bandage unless otherwise instructed. Do not apply any ointments or creams to your incision. You may have skin glue on your incision. Do not peel it off. It will come off on its own in about one week. Your arm may swell a bit after surgery. To reduce swelling use pillows to elevate your arm so it is above your heart. Your doctor will tell you if you need to lightly wrap your arm with an ACE bandage.  Diet  Resume your normal diet. There are not special food restrictions following this procedure. In order to heal from your surgery, it is CRITICAL to get adequate nutrition. Your body requires vitamins, minerals, and protein. Vegetables are the best source of vitamins and minerals. Vegetables also provide the perfect balance of protein. Processed food has little nutritional value, so try to avoid this.  Medications  Resume taking all of your medications. If your incision is causing pain, you may take over-the counter pain relievers such as acetaminophen (Tylenol). If you were prescribed a stronger pain medication, please be aware these medications can cause nausea and constipation. Prevent  nausea by taking the medication with a snack or meal. Avoid constipation by drinking plenty of fluids and eating foods with high amount of fiber, such as fruits, vegetables, and grains.  Do not take Tylenol if you are taking prescription pain medications.  Follow up Your surgeon may want to see you in the office following your access surgery. If so, this will be arranged at the time of your surgery.  Please call us immediately for any of the following conditions:  Increased pain, redness, drainage (pus) from your incision site Fever of 101 degrees or higher Severe or worsening pain at your incision site Hand pain or numbness.  Reduce your risk of vascular disease:  Stop smoking. If you would like help, call [facility] at 1-800-QUIT-NOW 279-632-1866) or [place] at [place] your cholesterol Maintain a desired weight Control your diabetes Keep your blood pressure down  Dialysis  It will take several weeks to several months for your new dialysis access to be ready for use. Your surgeon will determine when it is okay to use it. Your nephrologist will continue to direct your dialysis. You can continue to use your Permcath until your new access is ready for use.   11/29/2018 RACINE ERBY [id number] 1956/06/09  Surgeon(s): Marty Heck, MD  Procedure(s): TRANSLOCATION/FISTULA SUPERFICIALIZATION OF LEFT ARM FISTULA  x Do not stick fistula for 8 weeks    If you have any questions, please call the office at [phone].

## 2018-11-29 NOTE — Anesthesia Postprocedure Evaluation (Signed)
Anesthesia Post Note  Patient: Tami [name]  Procedure(s) Performed: TRANSLOCATION/FISTULA SUPERFICIALIZATION OF LEFT ARM FISTULA (Left Arm Upper)     Patient location during evaluation: PACU Anesthesia Type: General Level of consciousness: awake and alert Pain management: pain level controlled Vital Signs Assessment: post-procedure vital signs reviewed and stable Respiratory status: spontaneous breathing, nonlabored ventilation, respiratory function stable and patient connected to nasal cannula oxygen Cardiovascular status: blood pressure returned to baseline and stable Postop Assessment: no apparent nausea or vomiting Anesthetic complications: no    Last Vitals:  Vitals:   11/29/18 1243 11/29/18 1249  BP: (!) 99/59   Pulse: 76 78  Resp: 20 17  Temp:  (!) 36.1 C  SpO2: 97% 99%    Last Pain:  Vitals:   11/29/18 1249  TempSrc:   PainSc: 2                  Catalina Gravel

## 2018-11-29 NOTE — Telephone Encounter (Signed)
-----   Message from Gabriel Earing, [place] sent at 11/29/2018 12:06 PM EST ----- S/p superficialization left arm fistula 11/29/2018.  F/u in pa clinic in 4 weeks for wound check.  thanks

## 2018-11-29 NOTE — Anesthesia Procedure Notes (Signed)
Procedure Name: LMA Insertion Date/Time: 11/29/2018 11:10 AM Performed by: Moshe Salisbury, CRNA Pre-anesthesia Checklist: Patient identified, Emergency Drugs available, Suction available and Patient being monitored Patient Re-evaluated:Patient Re-evaluated prior to induction Oxygen Delivery Method: Circle System Utilized Preoxygenation: Pre-oxygenation with 100% oxygen Induction Type: IV induction Ventilation: Mask ventilation without difficulty LMA: LMA inserted LMA Size: 4.0 Number of attempts: 1 Placement Confirmation: positive ETCO2 Tube secured with: Tape Dental Injury: Teeth and Oropharynx as per pre-operative assessment

## 2018-11-29 NOTE — Telephone Encounter (Signed)
sch appt vm full mld ltr 3/3/2020130 pm wound check MD

## 2018-11-29 NOTE — Transfer of Care (Signed)
Immediate Anesthesia Transfer of Care Note  Patient: Tami [name]  Procedure(s) Performed: TRANSLOCATION/FISTULA SUPERFICIALIZATION OF LEFT ARM FISTULA (Left Arm Upper)  Patient Location: PACU  Anesthesia Type:General  Level of Consciousness: drowsy and patient cooperative  Airway & Oxygen Therapy: Patient Spontanous Breathing and Patient connected to nasal cannula oxygen  Post-op Assessment: Report given to RN, Post -op Vital signs reviewed and stable and Patient moving all extremities  Post vital signs: Reviewed and stable  Last Vitals:  Vitals Value Taken Time  BP 104/69 11/29/2018 12:13 PM  Temp    Pulse    Resp 21 11/29/2018 12:13 PM  SpO2    Vitals shown include unvalidated device data.  Last Pain:  Vitals:   11/29/18 0951  TempSrc:   PainSc: 0-No pain         Complications: No apparent anesthesia complications

## 2018-11-29 NOTE — Progress Notes (Signed)
Pt has pre-existing right upper chest Diatek. Both ports capped & clamped, dsg. CDI.

## 2018-11-30 ENCOUNTER — Encounter (HOSPITAL_COMMUNITY): Payer: Self-pay | Admitting: Vascular Surgery

## 2018-12-10 NOTE — Unmapped (Signed)
Patient now at home and agreed to repeat labs tomorrow.

## 2018-12-12 MED ORDER — GABAPENTIN 100 MG CAPSULE
ORAL | 0.00000 days
Start: 2018-12-12 — End: ?

## 2018-12-14 NOTE — Unmapped (Signed)
Called patient to discuss scheduling annual appointment, unable to reach patient, unable to leave VM because VM box full. No annuals scheduled at this time.

## 2018-12-17 ENCOUNTER — Ambulatory Visit: Payer: Medicaid Other | Admitting: Diagnostic Neuroimaging

## 2018-12-17 ENCOUNTER — Encounter: Payer: Self-pay | Admitting: Diagnostic Neuroimaging

## 2018-12-17 VITALS — BP 134/84 | HR 98 | Ht 60.0 in | Wt 140.8 lb

## 2018-12-17 DIAGNOSIS — Z944 Liver transplant status: Secondary | ICD-10-CM

## 2018-12-17 DIAGNOSIS — E1122 Type 2 diabetes mellitus with diabetic chronic kidney disease: Secondary | ICD-10-CM | POA: Diagnosis not present

## 2018-12-17 DIAGNOSIS — N186 End stage renal disease: Secondary | ICD-10-CM | POA: Diagnosis not present

## 2018-12-17 DIAGNOSIS — R569 Unspecified convulsions: Secondary | ICD-10-CM

## 2018-12-17 DIAGNOSIS — Z992 Dependence on renal dialysis: Secondary | ICD-10-CM

## 2018-12-17 MED ORDER — LEVETIRACETAM 500 MG PO TABS: ORAL_TABLET | ORAL | 12 refills | Status: DC

## 2018-12-17 NOTE — Patient Instructions (Signed)
SEIZURE EVENTS  - I would recommend starting levetiracetam 500mg  daily; + add'l 500mg  supplement on dialysis days  - According to [place] law, you can not drive unless you are seizure / syncope free for at least 6 months and under physician's care.   - Please maintain precautions. Do not participate in activities where a loss of awareness could harm you or someone else. No swimming alone, no tub bathing, no hot tubs, no driving, no operating motorized vehicles (cars, ATVs, motocycles, etc), lawnmowers, power tools or firearms. No standing at heights, such as rooftops, ladders or stairs. Avoid hot objects such as stoves, heaters, open fires. Wear a helmet when riding a bicycle, scooter, skateboard, etc. and avoid areas of traffic. Set your water heater to 120 degrees or less

## 2018-12-17 NOTE — Progress Notes (Signed)
[facility]  PATIENT: Tami [name] DOB: 09-28-1956  REFERRING CLINICIAN: ER  HISTORY FROM: patient and son REASON FOR VISIT: new consult    HISTORICAL  CHIEF COMPLAINT:  Chief Complaint  Patient presents with  . Seizures    rm 6, New pt, son-Ronnie, "had seizure in dr's office on 10/03/18, none since"     HISTORY OF PRESENT ILLNESS:   63 year old female here for evaluation of seizures.  History of liver transplant, hypertension, end-stage renal disease on hemodialysis.  10/03/2018 patient was at office visit, about to give blood when all of a sudden she became unresponsive and had some jerking movements.  Staff could not feel a pulse and started CPR.  Patient was immediately attended to by the rapid response team.  She was taken downstairs to the emergency room.  She may have had some type of gaze deviation initially resolved spontaneously.  While in the emergency room she had a second event with tonic-clonic seizures.  She was given IV Ativan and treated with Keppra.  She was admitted for further evaluation.  MRI, MRA of the head were obtained which showed no acute findings.  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  EEG was unremarkable.  Patient was discharged on no antiseizure medication.  Since that time patient is doing well.  No further events.    REVIEW OF SYSTEMS: Full 14 system review of systems performed and negative with exception of: Headache anemia runny nose.  ALLERGIES: Allergies  Allergen Reactions  . Iodinated Diagnostic Agents     UNSPECIFIED REACTION   . Ioxaglate     UNSPECIFIED REACTION   . Latex     UNSPECIFIED REACTION     HOME MEDICATIONS: Outpatient Medications Prior to Visit  Medication Sig Dispense Refill  . albuterol (PROVENTIL HFA;VENTOLIN HFA) 108 (90 Base) MCG/ACT inhaler Inhale 2 puffs into the lungs every 6 (six) hours as needed.     Marland Kitchen amLODipine (NORVASC) 5 MG tablet  Take 5 mg by mouth daily.    . cycloSPORINE modified (NEORAL) 25 MG capsule Take 50 mg by mouth 2 (two) times daily.     Marland Kitchen ENSURE (ENSURE) Take 1 Can by mouth daily.     . folic acid (FOLVITE) 1 MG tablet Take 1 mg by mouth daily.    Marland Kitchen gabapentin (NEURONTIN) 300 MG capsule Take 300 mg by mouth 2 (two) times daily.    Marland Kitchen labetalol (NORMODYNE) 100 MG tablet Take 100 mg by mouth 2 (two) times daily.    . ondansetron (ZOFRAN) 4 MG tablet Take 4 mg by mouth every 6 (six) hours as needed for nausea or vomiting.    . Oxycodone HCl 10 MG TABS Take 1 tablet (10 mg total) by mouth every 6 (six) hours as needed for up to 10 doses (severe pain). - hold for sedation (Patient taking differently: Take 10 mg by mouth every 4 (four) hours as needed (severe pain). - hold for sedation) 10 tablet 0  . Vitamin D, Ergocalciferol, (DRISDOL) 1.25 MG (50000 UT) CAPS capsule Take 50,000 Units by mouth once a week. Sunday    . allopurinol (ZYLOPRIM) 100 MG tablet Take 100 mg by mouth daily.  5  . loratadine (CLARITIN) 10 MG tablet Take 10 mg by mouth daily. X 10 days     No facility-administered medications prior to visit.     PAST MEDICAL HISTORY: Past Medical History:  Diagnosis Date  . Anemia   .  Arthritis   . ESRD (end stage renal disease) on dialysis [facility])    "MWF; [place]" (10/04/2018)  . Gout   . 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 Hypertension   . Seizures ([place]) 10/03/2018    PAST SURGICAL HISTORY: Past Surgical History:  Procedure Laterality Date  . ABDOMINAL HYSTERECTOMY    . AV FISTULA PLACEMENT Left 10/02/2018   Procedure: Creation of left arm Brachiocephalic Fistula;  Surgeon: Waynetta Sandy, MD;  Location: [place];  Service: Vascular;  Laterality: Left;  . FISTULA SUPERFICIALIZATION Left 11/29/2018   Procedure: TRANSLOCATION/FISTULA SUPERFICIALIZATION OF LEFT ARM FISTULA;  Surgeon: Marty Heck, MD;  Location: [place];  Service: Vascular;  Laterality: Left;  . LIVER  TRANSPLANT     1996  . TONSILLECTOMY      FAMILY HISTORY: Family History  Problem Relation Age of Onset  . Stroke Mother   . Cancer Father   . Lupus Sister        1 sister  . Hypertension Sister   . Cancer Brother        prostate- 1 brother  . Lupus Brother     SOCIAL HISTORY: Social History   Socioeconomic History  . Marital status: Divorced    Spouse name: Not on file  . Number of children: 2  . Years of education: some college  . Highest education level: Not on file  Occupational History    Comment: na  Social Needs  . Financial resource strain: Not on file  . Food insecurity:    Worry: Not on file    Inability: Not on file  . Transportation needs:    Medical: Not on file    Non-medical: Not on file  Tobacco Use  . Smoking status: Never Smoker  . Smokeless tobacco: Never Used  . Tobacco comment: never used tobacco  Substance and Sexual Activity  . Alcohol use: Not Currently    Alcohol/week: 0.0 standard drinks  . Drug use: No  . Sexual activity: Not on file  Lifestyle  . Physical activity:    Days per week: Not on file    Minutes per session: Not on file  . Stress: Not on file  Relationships  . Social connections:    Talks on phone: Not on file    Gets together: Not on file    Attends religious service: Not on file    Active member of club or organization: Not on file    Attends meetings of clubs or organizations: Not on file    Relationship status: Not on file  . Intimate partner violence:    Fear of current or ex partner: Not on file    Emotionally abused: Not on file    Physically abused: Not on file    Forced sexual activity: Not on file  Other Topics Concern  . Not on file  Social History Narrative   Lives with son   Caffeine - tea , little      PHYSICAL EXAM  GENERAL EXAM/CONSTITUTIONAL: Vitals:  Vitals:   12/17/18 1145  BP: 134/84  Pulse: 98  Weight: 140 lb 12.8 oz (63.9 kg)  Height: 5' (1.524 m)     Body mass index is  27.5 kg/m. Wt Readings from Last 3 Encounters:  12/17/18 140 lb 12.8 oz (63.9 kg)  11/29/18 134 lb (60.8 kg)  11/23/18 135 lb (61.2 kg)     Patient is in no distress; well developed, nourished and groomed; neck is supple  CARDIOVASCULAR:  Examination of carotid arteries is normal; no carotid bruits  Regular rate and rhythm, no murmurs  Examination of peripheral vascular system by observation and palpation is normal  EYES:  Ophthalmoscopic exam of optic discs and posterior segments is normal; no papilledema or hemorrhages  Vision Screening Comments: Unable to complete, didn't have glasses  MUSCULOSKELETAL:  Gait, strength, tone, movements noted in Neurologic exam below  NEUROLOGIC: MENTAL STATUS:  No flowsheet data found.  awake, alert, oriented to person, place and time  recent and remote memory intact  normal attention and concentration  language fluent, comprehension intact, naming intact  fund of knowledge appropriate  CRANIAL NERVE:   2nd - no papilledema on fundoscopic exam  2nd, 3rd, 4th, 6th - pupils equal and reactive to light, visual fields full to confrontation, extraocular muscles intact, no nystagmus  5th - facial sensation symmetric  7th - facial strength symmetric  8th - hearing intact  9th - palate elevates symmetrically, uvula midline  11th - shoulder shrug symmetric  12th - tongue protrusion midline  MOTOR:   normal bulk and tone, full strength in the BUE, BLE  SENSORY:   normal and symmetric to light touch, temperature, vibration  COORDINATION:   finger-nose-finger, fine finger movements normal  REFLEXES:   deep tendon reflexes TRACE and symmetric  GAIT/STATION:   narrow based gait     DIAGNOSTIC DATA (LABS, IMAGING, TESTING) - I reviewed patient records, labs, notes, testing and imaging myself where available.  Lab Results  Component Value Date   WBC 2.9 (L) 10/05/2018   HGB 14.3 11/29/2018   HCT 42.0  11/29/2018   MCV 100.7 (H) 10/05/2018   PLT 134 (L) 10/05/2018      Component Value Date/Time   NA 135 11/29/2018 1011   NA 137 08/10/2018   NA 140 10/13/2017 1106   NA 138 04/11/2017 1023   K 4.0 11/29/2018 1011   K 5.3 no visable hemolysis (H) 10/13/2017 1106   K 5.2 No visable hemolysis (H) 04/11/2017 1023   CL 98 10/05/2018 0409   CL 104 10/13/2017 1106   CO2 28 10/05/2018 0409   CO2 17 (L) 10/13/2017 1106   CO2 19 (L) 04/11/2017 1023   GLUCOSE 76 11/29/2018 1011   GLUCOSE 116 10/13/2017 1106   BUN 8 10/05/2018 0409   BUN 1 (A) 08/10/2018   BUN 58 (H) 10/13/2017 1106   BUN 33.4 (H) 04/11/2017 1023   CREATININE 2.82 (H) 10/05/2018 0409   CREATININE 5.16 (HH) 10/03/2018 1338   CREATININE 5.9 (HH) 10/13/2017 1106   CREATININE 1.8 (H) 04/11/2017 1023   CALCIUM 7.9 (L) 10/05/2018 0409   CALCIUM 8.4 10/13/2017 1106   CALCIUM 9.0 04/11/2017 1023   PROT 6.5 10/04/2018 0535   PROT 7.7 10/13/2017 1106   PROT 8.8 (H) 04/11/2017 1023   ALBUMIN 2.0 (L) 10/04/2018 0535   ALBUMIN 3.3 10/13/2017 1106   ALBUMIN 3.7 04/11/2017 1023   AST 17 10/04/2018 0535   AST 13 (L) 10/03/2018 1338   AST 16 04/11/2017 1023   ALT <5 10/04/2018 0535   ALT 1 10/03/2018 1338   ALT 15 10/13/2017 1106   ALT 8 04/11/2017 1023   ALKPHOS 66 10/04/2018 0535   ALKPHOS 68 10/13/2017 1106   ALKPHOS 80 04/11/2017 1023   BILITOT 0.4 10/04/2018 0535   BILITOT 0.4 10/03/2018 1338   BILITOT 0.54 04/11/2017 1023   GFRNONAA 17 (L) 10/05/2018 0409   GFRNONAA 8 (L) 10/03/2018 1338   GFRAA 20 (L) 10/05/2018  0409   GFRAA 10 (L) 10/03/2018 1338   Lab Results  Component Value Date   CHOL 124 10/03/2018   HDL 23 (L) 10/03/2018   LDLCALC 73 10/03/2018   TRIG 138 10/03/2018   CHOLHDL 5.4 10/03/2018   No results found for: HGBA1C Lab Results  Component Value Date   VITAMINB12 301 10/03/2018   Lab Results  Component Value Date   TSH 0.831 10/03/2018     10/03/18 MRI HEAD [I reviewed images myself and  agree with interpretation. -VRP]  1. No acute intracranial process. 2. Mild chronic small vessel ischemic changes. Old RIGHT basal ganglia lacunar infarct.  10/03/18 MRA HEAD [I reviewed images myself and agree with interpretation. -VRP]  1. No emergent large vessel occlusion. 2. Severe LEFT and moderate RIGHT ICA stenosis.    ASSESSMENT AND PLAN  63 y.o. year old female here with new onset events with unresponsiveness, convulsions, incontinence, suspicious for seizures.  First event may have been syncope versus seizure.  Second event more likely epileptic seizure.  Dx:  1. Seizures ([place])   2. 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 H/O liver transplant [facility])      PLAN:  CARDIAC ARREST / SYNCOPE VS SEIZURE EVENTS (new onset; 1 event in hematology clinic; 1 event at [place] [facility] may have been seizure)  - MRI and EEG unremarkable; not currently on anti-seizure meds  - I would recommend starting levetiracetam 500mg  daily; + add'l 500mg  supplement on dialysis days  - According to [place] law, you can not drive unless you are seizure / syncope free for at least 6 months and under physician's care.   - Please maintain precautions. Do not participate in activities where a loss of awareness could harm you or someone else. No swimming alone, no tub bathing, no hot tubs, no driving, no operating motorized vehicles (cars, ATVs, motocycles, etc), lawnmowers, power tools or firearms. No standing at heights, such as rooftops, ladders or stairs. Avoid hot objects such as stoves, heaters, open fires. Wear a helmet when riding a bicycle, scooter, skateboard, etc. and avoid areas of traffic. Set your water heater to 120 degrees or less.   Meds ordered this encounter  Medications  . levETIRAcetam (KEPPRA) 500 MG tablet    Sig: Take 1 tablet (500 mg total) by mouth daily AND 1 tablet (500 mg total) every Monday, Wednesday, and Friday at 6 PM.     Dispense:  50 tablet    Refill:  12   Return in about 6 months (around 06/17/2019).  I reviewed images, labs, notes, records myself. I summarized findings and reviewed with patient, for this high risk condition (seizure disorder) requiring high complexity decision making.     Penni Bombard, MD 8/38/1840, 37:54 PM Certified in Neurology, Neurophysiology and Neuroimaging  [facility] [address] [place], [place] [address] [phone]

## 2018-12-18 LAB — CBC W/ DIFFERENTIAL
BANDED NEUTROPHILS ABSOLUTE COUNT: 0 10*3/uL (ref 0.0–0.1)
BASOPHILS ABSOLUTE COUNT: 0 10*3/uL (ref 0.0–0.2)
BASOPHILS RELATIVE PERCENT: 0 %
EOSINOPHILS RELATIVE PERCENT: 2 %
HEMATOCRIT: 38.3 % (ref 34.0–46.6)
HEMOGLOBIN: 12.3 g/dL (ref 11.1–15.9)
LYMPHOCYTES ABSOLUTE COUNT: 0.8 10*3/uL (ref 0.7–3.1)
LYMPHOCYTES RELATIVE PERCENT: 21 %
MEAN CORPUSCULAR HEMOGLOBIN CONC: 32.1 g/dL (ref 31.5–35.7)
MEAN CORPUSCULAR HEMOGLOBIN: 29.8 pg (ref 26.6–33.0)
MEAN CORPUSCULAR VOLUME: 93 fL (ref 79–97)
MONOCYTES ABSOLUTE COUNT: 0.4 10*3/uL (ref 0.1–0.9)
NEUTROPHILS ABSOLUTE COUNT: 2.5 10*3/uL (ref 1.4–7.0)
NEUTROPHILS RELATIVE PERCENT: 66 %
PLATELET COUNT: 248 10*3/uL (ref 150–450)
RED BLOOD CELL COUNT: 4.13 x10E6/uL (ref 3.77–5.28)
RED CELL DISTRIBUTION WIDTH: 13.9 % (ref 11.7–15.4)
WHITE BLOOD CELL COUNT: 3.9 10*3/uL (ref 3.4–10.8)

## 2018-12-18 LAB — COMPREHENSIVE METABOLIC PANEL
ALT (SGPT): <5 IU/L (ref 0–32)
AST (SGOT): 13 IU/L (ref 0–40)
BILIRUBIN TOTAL: 0.4 mg/dL (ref 0.0–1.2)
BLOOD UREA NITROGEN: 6 mg/dL — ABNORMAL LOW (ref 8–27)
BUN / CREAT RATIO: 2 — ABNORMAL LOW (ref 12–28)
CALCIUM: 9.1 mg/dL (ref 8.7–10.3)
CHLORIDE: 94 mmol/L — ABNORMAL LOW (ref 96–106)
CO2: 26 mmol/L (ref 20–29)
CREATININE: 2.6 mg/dL — ABNORMAL HIGH (ref 0.57–1.00)
GLOBULIN, TOTAL: 4.4 g/dL (ref 1.5–4.5)
GLUCOSE: 82 mg/dL (ref 65–99)
POTASSIUM: 4.6 mmol/L (ref 3.5–5.2)
SODIUM: 136 mmol/L (ref 134–144)
TOTAL PROTEIN: 8.6 g/dL — ABNORMAL HIGH (ref 6.0–8.5)

## 2018-12-18 LAB — HEMATOCRIT: Lab: 38.3

## 2018-12-18 LAB — BILIRUBIN TOTAL: Lab: 0.4

## 2018-12-18 LAB — BILIRUBIN DIRECT: Lab: 0.15

## 2018-12-18 LAB — MAGNESIUM: Lab: 1.8

## 2018-12-18 LAB — PHOSPHORUS, SERUM: Lab: 3.1

## 2018-12-18 LAB — GAMMA GLUTAMYL TRANSFERASE: Lab: 16

## 2018-12-19 ENCOUNTER — Encounter: Payer: Self-pay | Admitting: Diagnostic Neuroimaging

## 2018-12-19 LAB — CYCLOSPORINE, LC-MS/MS: Lab: 33 — ABNORMAL LOW

## 2018-12-19 NOTE — Unmapped (Signed)
[facility] Refill Coordination Note    Specialty Medication(s) to be Shipped:   Transplant: Neoral 25mg     Other medication(s) to be shipped: none     Katherine Norton, DOB: [birth year]  Phone: [phone] (home)       All above HIPAA information was verified with patient.     Completed refill call assessment today to schedule patient's medication shipment from the [facility] [phone]).       Specialty medication(s) and dose(s) confirmed: Regimen is correct and unchanged.   Changes to medications: Tiny reports no changes reported at this time.  Changes to insurance: No  Questions for the pharmacist: No    The patient will receive a drug information handout for each medication shipped and additional FDA Medication Guides as required.      DISEASE/MEDICATION-SPECIFIC INFORMATION        N/A    ADHERENCE     Medication Adherence    Patient reported X missed doses in the last month:  0              MEDICARE PART B DOCUMENTATION     Neoral 25mg : Patient has 8 days worth of capsules on hand.    SHIPPING     Shipping address confirmed in Epic.     Delivery Scheduled: Yes, Expected medication delivery date: 12/25/18 via UPS or courier.     Medication will be delivered via UPS to the home address in Epic WAM.    [place] A Raymund Manrique   [facility] Specialty Technician

## 2018-12-20 NOTE — Unmapped (Signed)
Patient confirmed her drug level was a 16 hour drug trough.  Stated she no longer drives now and it was the only time she could get a ride to the lab.  Confirmed next lab draw she will go in the morning for a reliable drug trough.

## 2018-12-24 MED FILL — NEORAL 25 MG CAPSULE: 30 days supply | Qty: 120 | Fill #3 | Status: AC

## 2018-12-24 MED FILL — NEORAL 25 MG CAPSULE: 30 days supply | Qty: 120 | Fill #3

## 2018-12-25 ENCOUNTER — Ambulatory Visit: Payer: Medicaid Other | Admitting: Vascular Surgery

## 2018-12-28 NOTE — Unmapped (Signed)
Called patient to discuss scheduling liver transplant annual appointments, patient confirmed 01/31/19 will work, appointment letter mailed to patient.

## 2019-01-01 ENCOUNTER — Other Ambulatory Visit: Payer: Self-pay

## 2019-01-01 ENCOUNTER — Ambulatory Visit (INDEPENDENT_AMBULATORY_CARE_PROVIDER_SITE_OTHER): Payer: Medicaid Other | Admitting: Vascular Surgery

## 2019-01-01 ENCOUNTER — Encounter: Payer: Self-pay | Admitting: Vascular Surgery

## 2019-01-01 VITALS — BP 144/97 | HR 86 | Temp 98.6°F | Resp 16 | Ht 60.0 in | Wt 139.1 lb

## 2019-01-01 DIAGNOSIS — N186 End stage renal disease: Secondary | ICD-10-CM

## 2019-01-01 DIAGNOSIS — Z992 Dependence on renal dialysis: Secondary | ICD-10-CM

## 2019-01-01 DIAGNOSIS — E1122 Type 2 diabetes mellitus with diabetic chronic kidney disease: Secondary | ICD-10-CM

## 2019-01-01 NOTE — Progress Notes (Signed)
Patient name: Tami [name] MRN: [id number] DOB: 01/07/1956 Sex: female  REASON FOR VISIT: Post-op check after superficialization of left arm fistula  HPI: Tami [name] is a 63 y.o. female that presents for postop check after superficialization of left brachiocephalic fistula.  Patient underwent revision on November 29, 2018 with a sidebranch ligation and superficialization given her fistula was too deep to cannulate.  She reports all of her incisions have healed.  She has no specific complaints today.  Her left hand still feels okay.  They have not started using the fistula yet and she is currently getting dialysis via a tunneled catheter in her right IJ.  Past Medical History:  Diagnosis Date  . Anemia   . Arthritis   . ESRD (end stage renal disease) on dialysis [facility])    "MWF; [place]" (10/04/2018)  . Gout   . Hepatitis, autoimmune ([place]) 12/08/2011  . Hypertension   . Seizures ([place]) 10/03/2018    Past Surgical History:  Procedure Laterality Date  . ABDOMINAL HYSTERECTOMY    . AV FISTULA PLACEMENT Left 10/02/2018   Procedure: Creation of left arm Brachiocephalic Fistula;  Surgeon: Waynetta Sandy, MD;  Location: [place];  Service: Vascular;  Laterality: Left;  . FISTULA SUPERFICIALIZATION Left 11/29/2018   Procedure: TRANSLOCATION/FISTULA SUPERFICIALIZATION OF LEFT ARM FISTULA;  Surgeon: Marty Heck, MD;  Location: [place];  Service: Vascular;  Laterality: Left;  . LIVER TRANSPLANT     1996  . TONSILLECTOMY      Family History  Problem Relation Age of Onset  . Stroke Mother   . Cancer Father   . Lupus Sister        1 sister  . Hypertension Sister   . Cancer Brother        prostate- 1 brother  . Lupus Brother     SOCIAL HISTORY: Social History   Tobacco Use  . Smoking status: Never Smoker  . Smokeless tobacco: Never Used  . Tobacco comment: never used tobacco  Substance Use Topics  . Alcohol use: Not Currently    Alcohol/week: 0.0 standard drinks     Allergies  Allergen Reactions  . Iodinated Diagnostic Agents     UNSPECIFIED REACTION   . Ioxaglate     UNSPECIFIED REACTION   . Latex     UNSPECIFIED REACTION     Current Outpatient Medications  Medication Sig Dispense Refill  . albuterol (PROVENTIL HFA;VENTOLIN HFA) 108 (90 Base) MCG/ACT inhaler Inhale 2 puffs into the lungs every 6 (six) hours as needed.     Marland Kitchen allopurinol (ZYLOPRIM) 100 MG tablet Take 100 mg by mouth daily.  5  . amLODipine (NORVASC) 5 MG tablet Take 5 mg by mouth daily.    . cycloSPORINE modified (NEORAL) 25 MG capsule Take 50 mg by mouth 2 (two) times daily.     Marland Kitchen ENSURE (ENSURE) Take 1 Can by mouth daily.     . folic acid (FOLVITE) 1 MG tablet Take 1 mg by mouth daily.    Marland Kitchen gabapentin (NEURONTIN) 300 MG capsule Take 300 mg by mouth 2 (two) times daily.    Marland Kitchen labetalol (NORMODYNE) 100 MG tablet Take 100 mg by mouth 2 (two) times daily.    Marland Kitchen levETIRAcetam (KEPPRA) 500 MG tablet Take 1 tablet (500 mg total) by mouth daily AND 1 tablet (500 mg total) every Monday, Wednesday, and Friday at 6 PM. 50 tablet 12  . ondansetron (ZOFRAN) 4 MG tablet Take 4 mg by mouth every  6 (six) hours as needed for nausea or vomiting.    . Oxycodone HCl 10 MG TABS Take 1 tablet (10 mg total) by mouth every 6 (six) hours as needed for up to 10 doses (severe pain). - hold for sedation (Patient taking differently: Take 10 mg by mouth every 4 (four) hours as needed (severe pain). - hold for sedation) 10 tablet 0  . Vitamin D, Ergocalciferol, (DRISDOL) 1.25 MG (50000 UT) CAPS capsule Take 50,000 Units by mouth once a week. Sunday    . loratadine (CLARITIN) 10 MG tablet Take 10 mg by mouth daily. X 10 days     No current facility-administered medications for this visit.     REVIEW OF SYSTEMS:  [X]  denotes positive finding, [ ]  denotes negative finding Cardiac  Comments:  Chest pain or chest pressure:    Shortness of breath upon exertion:    Short of breath when lying flat:     Irregular heart rhythm:        Vascular    Pain in calf, thigh, or hip brought on by ambulation:    Pain in feet at night that wakes you up from your sleep:     Blood clot in your veins:    Leg swelling:         Pulmonary    Oxygen at home:    Productive cough:     Wheezing:         Neurologic    Sudden weakness in arms or legs:     Sudden numbness in arms or legs:     Sudden onset of difficulty speaking or slurred speech:    Temporary loss of vision in one eye:     Problems with dizziness:         Gastrointestinal    Blood in stool:     Vomited blood:         Genitourinary    Burning when urinating:     Blood in urine:        Psychiatric    Major depression:         Hematologic    Bleeding problems:    Problems with blood clotting too easily:        Skin    Rashes or ulcers:        Constitutional    Fever or chills:      PHYSICAL EXAM: Vitals:   01/01/19 1438  BP: (!) 144/97  Pulse: 86  Resp: 16  Temp: 98.6 F (37 C)  TempSrc: Oral  SpO2: 93%  Weight: 139 lb 1.8 oz (63.1 kg)  Height: 5' (1.524 m)    GENERAL: The patient is a well-nourished female, in no acute distress. The vital signs are documented above. Left arm brachicephalic AVF with good thrill - incisions well healed Left radial pulse palpable No weakness in left hand or tissue loss  DATA:     Assessment/Plan:  63 year old female status post left upper extremity AV fistula superficialization with sidebranch ligation.  Fistula has a great thrill today and all of her incisions have healed without any apparent issues.  I discussed after another 3 weeks or so when she is about 2 months postop they can start accessing the fistula and hopefully it will work well for her.  She can let us know if there is any problems.  Follow-up PRN.   Marty Heck, MD [facility] Office: [phone] Pager: [phone]

## 2019-01-07 DIAGNOSIS — K769 Liver disease, unspecified: Principal | ICD-10-CM

## 2019-01-07 DIAGNOSIS — Z79899 Other long term (current) drug therapy: Principal | ICD-10-CM

## 2019-01-07 DIAGNOSIS — Z944 Liver transplant status: Principal | ICD-10-CM

## 2019-01-15 NOTE — Unmapped (Signed)
[facility] Clinical Assessment & Refill Coordination Note    Madeliene Tejera, [place]: 1956/07/03  Phone: [phone] (home)     All above HIPAA information was verified with patient.     Specialty Medication(s):   Transplant: Neoral 25mg      Current Outpatient Medications   Medication Sig Dispense Refill   ??? cycloSPORINE modified (NEORAL) 25 MG capsule Take 50 mg by mouth Two (2) times a day. Using this instead of liquid     ??? acetaminophen (TYLENOL) 325 MG tablet 2 tablets (650 mg total) by G-tube route every six (6) hours as needed.  0   ??? albuterol 2.5 mg/0.5 mL nebulizer solution Inhale 0.5 mL (2.5 mg total) by nebulization every six (6) hours as needed for wheezing or shortness of breath. 30 each 12   ??? amLODIPine (NORVASC) 5 MG tablet Take 5 mg by mouth daily.     ??? calcitonin, salmon, (MIACALCIN) 200 unit/actuation nasal spray 1 spray by Alternating Nares route once daily.      ??? chlorhexidine (PERIDEX) 0.12 % solution 5 mL by Mouth route two (2) times a day. 1 Bottle 0   ??? cycloSPORINE (NEORAL) 100 mg/mL microemulsion solution 0.8 mL (80 mg total) by G-tube route two (2) times a day. (Patient not taking: Reported on 01/15/2019) 50 mL 12   ??? dextrose (D10W) 10% Soln bolus Infuse 125 mL into a venous catheter every thirty (30) minutes as needed.  0   ??? dextrose 5 % and sodium chloride 0.9 % (D5-NS) infu Infuse 25 mL/hr into a venous catheter continuous as needed (if tube feed held or NPO).  0   ??? famotidine (PEPCID) 20 MG tablet 1 tablet (20 mg total) by G-tube route daily. 30 tablet 0   ??? gentamicin 1 mg/mL, sodium citrate 4% Gent locks with dialysis 2.4 mL 4   ??? heparin sodium,porcine (HEPARIN, PORCINE,) 1,000 unit/mL 1000 unit/mL injection This heparin is for dialysis use ONLY. RN if you are unable to aspirate from both lumes, call provider piror to using the catheter. 1 mL 0   ??? heparin sodium,porcine (HEPARIN, PORCINE,) 5,000 unit/mL injection Inject 1 mL (5,000 Units total) under the skin every eight (8) hours. 1 mL 0   ??? insulin lispro (HUMALOG) 100 unit/mL injection Inject 0-0.12 mL (0-12 Units total) under the skin Every six (6) hours. 1440 Units 0   ??? melatonin 3 mg Tab Take 1 tablet (3 mg total) by mouth nightly.  0   ??? NEORAL 25 mg capsule TAKE 2 CAPSULES (50MG ) BY MOUTH TWICE DAILY 120 each 8   ??? oxyCODONE (ROXICODONE) 5 MG immediate release tablet 1 tablet (5 mg total) by G-tube route every six (6) hours as needed. for up to 5 days 10 tablet 0   ??? sodium chloride 3 % nebulizer solution Inhale 4 mL by nebulization 3 (three) times a day. 750 mL 12     No current facility-administered medications for this visit.         Changes to medications: Averi reports no changes reported at this time.    Allergies   Allergen Reactions   ??? Iodinated Contrast Media    ??? Ioxaglate Sodium    ??? Latex        Changes to allergies: No    SPECIALTY MEDICATION ADHERENCE     Neoral 25mg  : 7 days of medicine on hand       Medication Adherence    Patient reported X  missed doses in the last month:  0  Specialty Medication:  neoral 25mg           Specialty medication(s) dose(s) confirmed: Regimen is correct and unchanged.     Are there any concerns with adherence? No    Adherence counseling provided? Not needed    CLINICAL MANAGEMENT AND INTERVENTION      Clinical Benefit Assessment:    Do you feel the medicine is effective or helping your condition? Yes    Clinical Benefit counseling provided? Not needed    Adverse Effects Assessment:    Are you experiencing any side effects? No    Are you experiencing difficulty administering your medicine? No    Quality of Life Assessment:    How many days over the past month did your transplant  keep you from your normal activities? For example, brushing your teeth or getting up in the morning. 0    Have you discussed this with your provider? Not needed    Therapy Appropriateness:    Is therapy appropriate? Yes, therapy is appropriate and should be continued DISEASE/MEDICATION-SPECIFIC INFORMATION      N/A    PATIENT SPECIFIC NEEDS     ? Does the patient have any physical, cognitive, or cultural barriers? No    ? Is the patient high risk? No     ? Does the patient require a Care Management Plan? No     ? Does the patient require physician intervention or other additional services (i.e. nutrition, smoking cessation, social work)? No      SHIPPING     Specialty Medication(s) to be Shipped:   Transplant: Neoral 25mg     Other medication(s) to be shipped: none - patient states all other meds come from local pharmacy     Changes to insurance: No    Delivery Scheduled: Yes, Expected medication delivery date: 01/18/2019.     Medication will be delivered via UPS to the confirmed home address in [facility].    The patient will receive a drug information handout for each medication shipped and additional FDA Medication Guides as required.  Verified that patient has previously received a Conservation officer, historic buildings.    Thad Ranger   [facility] Specialty Pharmacist

## 2019-01-17 MED FILL — NEORAL 25 MG CAPSULE: 30 days supply | Qty: 120 | Fill #4

## 2019-01-17 MED FILL — NEORAL 25 MG CAPSULE: 30 days supply | Qty: 120 | Fill #4 | Status: AC

## 2019-01-18 MED ORDER — METOPROLOL SUCCINATE ER 50 MG TABLET,EXTENDED RELEASE 24 HR
ORAL | 0.00000 days
Start: 2019-01-18 — End: ?

## 2019-01-24 NOTE — Unmapped (Signed)
Called patient to discuss rescheduling annual appointments 90 days out. Patient confirmed he can come on 05/02/19, annuals rescheduled and appointment letter sent to patient via Epic letter.

## 2019-01-30 LAB — COMPREHENSIVE METABOLIC PANEL
A/G RATIO: 0.8 — ABNORMAL LOW (ref 1.2–2.2)
ALKALINE PHOSPHATASE: 103 IU/L (ref 39–117)
ALT (SGPT): 5 IU/L (ref 0–32)
AST (SGOT): 11 IU/L (ref 0–40)
BILIRUBIN TOTAL: 0.3 mg/dL (ref 0.0–1.2)
BLOOD UREA NITROGEN: 20 mg/dL (ref 8–27)
BUN / CREAT RATIO: 5 — ABNORMAL LOW (ref 12–28)
CHLORIDE: 95 mmol/L — ABNORMAL LOW (ref 96–106)
CO2: 23 mmol/L (ref 20–29)
CREATININE: 4.09 mg/dL — ABNORMAL HIGH (ref 0.57–1.00)
GLOBULIN, TOTAL: 4.2 g/dL (ref 1.5–4.5)
GLUCOSE: 95 mg/dL (ref 65–99)
POTASSIUM: 4 mmol/L (ref 3.5–5.2)
SODIUM: 137 mmol/L (ref 134–144)
TOTAL PROTEIN: 7.7 g/dL (ref 6.0–8.5)
TOTAL PROTEIN: 7.7 g/dL — ABNORMAL LOW (ref 6.0–8.5)

## 2019-01-30 LAB — CBC W/ DIFFERENTIAL
BANDED NEUTROPHILS ABSOLUTE COUNT: 0 10*3/uL (ref 0.0–0.1)
BASOPHILS ABSOLUTE COUNT: 0 10*3/uL (ref 0.0–0.2)
BASOPHILS RELATIVE PERCENT: 0 %
EOSINOPHILS ABSOLUTE COUNT: 0.1 10*3/uL (ref 0.0–0.4)
EOSINOPHILS RELATIVE PERCENT: 4 %
HEMATOCRIT: 27.2 % — ABNORMAL LOW (ref 34.0–46.6)
HEMOGLOBIN: 9.2 g/dL — ABNORMAL LOW (ref 11.1–15.9)
IMMATURE GRANULOCYTES: 0 %
LYMPHOCYTES ABSOLUTE COUNT: 0.9 10*3/uL (ref 0.7–3.1)
LYMPHOCYTES RELATIVE PERCENT: 28 %
MEAN CORPUSCULAR HEMOGLOBIN CONC: 33.8 g/dL (ref 31.5–35.7)
MEAN CORPUSCULAR HEMOGLOBIN: 30.2 pg (ref 26.6–33.0)
MEAN CORPUSCULAR VOLUME: 89 fL — ABNORMAL LOW (ref 79–97)
MONOCYTES ABSOLUTE COUNT: 0.5 10*3/uL (ref 0.1–0.9)
MONOCYTES RELATIVE PERCENT: 14 %
NEUTROPHILS ABSOLUTE COUNT: 1.8 10*3/uL (ref 1.4–7.0)
NEUTROPHILS RELATIVE PERCENT: 54 %
RED BLOOD CELL COUNT: 3.05 x10E6/uL — ABNORMAL LOW (ref 3.77–5.28)
RED CELL DISTRIBUTION WIDTH: 14.4 % (ref 11.7–15.4)
WHITE BLOOD CELL COUNT: 3.3 10*3/uL — ABNORMAL LOW (ref 3.4–10.8)

## 2019-02-04 NOTE — Unmapped (Signed)
Spoke with Vickie RN at patients dialysis center about low H/H.  Stated patient Aranesp dose is being increased this week and they did not need me to fax the lab results this week to make them aware.  Confirmed patient continues dialysis on Mondays, Wednesdays, and Fridays.  Patient goes to [facility] [address], [place], [place] [address] Phone: [phone] Fax: [phone].

## 2019-02-07 NOTE — Unmapped (Signed)
[facility] Refill Coordination Note    Specialty Medication(s) to be Shipped:   Transplant: Neoral 25mg     Other medication(s) to be shipped: NONE     Katherine Norton, DOB: 01-16-1956  Phone: [phone] (home)       All above HIPAA information was verified with patient.     Completed refill call assessment today to schedule patient's medication shipment from the [facility] [phone]).       Specialty medication(s) and dose(s) confirmed: Regimen is correct and unchanged.   Changes to medications: Katherine Norton reports no changes at this time.  Changes to insurance: No  Questions for the pharmacist: No    Confirmed patient received Welcome Packet with first shipment. The patient will receive a drug information handout for each medication shipped and additional FDA Medication Guides as required.       DISEASE/MEDICATION-SPECIFIC INFORMATION        N/A    SPECIALTY MEDICATION ADHERENCE     Medication Adherence    Patient reported X missed doses in the last month:  0  Specialty Medication:  Neoral 25mg   Patient is on additional specialty medications:  No                Neoral 25 mg: 7 days of medicine on hand     SHIPPING     Shipping address confirmed in Epic.     Delivery Scheduled: Yes, Expected medication delivery date: 02/13/2019.     Medication will be delivered via UPS to the home address in [place].    Katherine Norton   [facility] Specialty Pharmacist

## 2019-02-11 ENCOUNTER — Other Ambulatory Visit: Payer: Self-pay | Admitting: Family

## 2019-02-11 DIAGNOSIS — D631 Anemia in chronic kidney disease: Secondary | ICD-10-CM

## 2019-02-11 DIAGNOSIS — N189 Chronic kidney disease, unspecified: Principal | ICD-10-CM

## 2019-02-12 ENCOUNTER — Telehealth: Payer: Self-pay

## 2019-02-12 ENCOUNTER — Inpatient Hospital Stay: Payer: Medicaid Other | Attending: Family | Admitting: Family

## 2019-02-12 ENCOUNTER — Telehealth: Payer: Self-pay | Admitting: *Deleted

## 2019-02-12 ENCOUNTER — Other Ambulatory Visit: Payer: Self-pay

## 2019-02-12 ENCOUNTER — Encounter: Payer: Self-pay | Admitting: Family

## 2019-02-12 ENCOUNTER — Inpatient Hospital Stay: Payer: Medicaid Other

## 2019-02-12 VITALS — BP 131/79 | HR 94 | Resp 18 | Ht 61.0 in | Wt 140.1 lb

## 2019-02-12 DIAGNOSIS — N189 Chronic kidney disease, unspecified: Principal | ICD-10-CM

## 2019-02-12 DIAGNOSIS — Z944 Liver transplant status: Secondary | ICD-10-CM

## 2019-02-12 DIAGNOSIS — D61818 Other pancytopenia: Secondary | ICD-10-CM

## 2019-02-12 DIAGNOSIS — D631 Anemia in chronic kidney disease: Secondary | ICD-10-CM

## 2019-02-12 DIAGNOSIS — Z992 Dependence on renal dialysis: Secondary | ICD-10-CM | POA: Diagnosis not present

## 2019-02-12 DIAGNOSIS — J329 Chronic sinusitis, unspecified: Secondary | ICD-10-CM

## 2019-02-12 DIAGNOSIS — K754 Autoimmune hepatitis: Secondary | ICD-10-CM | POA: Diagnosis not present

## 2019-02-12 LAB — CMP (CANCER CENTER ONLY)
ALT: 4 U/L (ref 0–44)
AST: 12 U/L — ABNORMAL LOW (ref 15–41)
Albumin: 3.7 g/dL (ref 3.5–5.0)
Alkaline Phosphatase: 70 U/L (ref 38–126)
Anion gap: 10 (ref 5–15)
BUN: 11 mg/dL (ref 8–23)
CO2: 34 mmol/L — ABNORMAL HIGH (ref 22–32)
Calcium: 8.3 mg/dL — ABNORMAL LOW (ref 8.9–10.3)
Chloride: 94 mmol/L — ABNORMAL LOW (ref 98–111)
Creatinine: 3.69 mg/dL (ref 0.44–1.00)
GFR, Est AFR Am: 14 mL/min — ABNORMAL LOW (ref >60)
GFR, Estimated: 12 mL/min — ABNORMAL LOW (ref >60)
Glucose, Bld: 96 mg/dL (ref 70–99)
Potassium: 3.3 mmol/L — ABNORMAL LOW (ref 3.5–5.1)
Sodium: 138 mmol/L (ref 135–145)
Total Bilirubin: 0.6 mg/dL (ref 0.3–1.2)
Total Protein: 7.8 g/dL (ref 6.5–8.1)

## 2019-02-12 LAB — CBC WITH DIFFERENTIAL (CANCER CENTER ONLY)
Abs Immature Granulocytes: 0.01 10*3/uL (ref 0.00–0.07)
Basophils Absolute: 0 10*3/uL (ref 0.0–0.1)
Basophils Relative: 0 %
Eosinophils Absolute: 0.1 10*3/uL (ref 0.0–0.5)
Eosinophils Relative: 2 %
HCT: 30 % — ABNORMAL LOW (ref 36.0–46.0)
Hemoglobin: 9.1 g/dL — ABNORMAL LOW (ref 12.0–15.0)
Immature Granulocytes: 0 %
Lymphocytes Relative: 18 %
Lymphs Abs: 0.5 10*3/uL — ABNORMAL LOW (ref 0.7–4.0)
MCH: 29.3 pg (ref 26.0–34.0)
MCHC: 30.3 g/dL (ref 30.0–36.0)
MCV: 96.5 fL (ref 80.0–100.0)
Monocytes Absolute: 0.4 10*3/uL (ref 0.1–1.0)
Monocytes Relative: 14 %
Neutro Abs: 1.8 10*3/uL (ref 1.7–7.7)
Neutrophils Relative %: 66 %
Platelet Count: 168 10*3/uL (ref 150–400)
RBC: 3.11 MIL/uL — ABNORMAL LOW (ref 3.87–5.11)
RDW: 16.3 % — ABNORMAL HIGH (ref 11.5–15.5)
WBC Count: 2.7 10*3/uL — ABNORMAL LOW (ref 4.0–10.5)
nRBC: 0 % (ref 0.0–0.2)

## 2019-02-12 MED FILL — NEORAL 25 MG CAPSULE: 30 days supply | Qty: 120 | Fill #5 | Status: AC

## 2019-02-12 MED FILL — NEORAL 25 MG CAPSULE: 30 days supply | Qty: 120 | Fill #5

## 2019-02-12 NOTE — Telephone Encounter (Signed)
Critical Value Creatinine 3.69 Laverna Peace NP notified. No orders at this time.

## 2019-02-12 NOTE — Telephone Encounter (Signed)
Pt called concerned because she is having swelling in her arm where she has her fistula. She said that it is tender and blue. She states that they stuck her wrong in dialysis and that it has bothered her since and they are not using that site right now. Called the dialysis center and they state that she had an infiltrate when she was in last Friday and the appearance seems to be normal. She has bruit and thrill still. They said that they did not feel that she needed to be seen in the office but said that they will continue to follow her in the office and let us know if anything changes.   Pt was called and notified that they will be watching her arm when she is in for treatments and if anything is needed as far as an appt goes, they will contact our office.   York Cerise, CMA

## 2019-02-12 NOTE — Progress Notes (Signed)
Hematology and Oncology Follow Up Visit  Tami [name] [id number] 05-01-1956 63 y.o. 02/12/2019   Principle Diagnosis:  Chronic pancytopenia  Status post liver transplant for autoimmune hepatitis Anemia of chronic kidney disease  Current Therapy:   Aranesp 300 mcg SQ as needed for hemoglobin less than 10   Interim History:  Tami [name] is here today for follow-up. She has had a sinus infection that her PCP has been treating. She completed courses with Augmentin  and Zithromax but still has the dry cough and sore throat. She plans to follow-up with their office today for further instructions. No fever, n/v, rash, dizziness, SOB, chest pain, palpitations, abdominal pain or changes in bowel habits.  She states that she stays cold due to dialysis. She goes M, W and F for dialysis each week. I called and spoke with the charge nurse at [facility] dialysis center [phone]) and she is receiving Aranesp with them once a week. She last received yesterday with treatment. Hgb today is 9.1.  She makes very little urine daily.  She denies having any recent seizures.  She is ambulating with a cane for support. No falls or syncopal episodes to report.  No swelling, tenderness, numbness or tingling in her extremities. No lymphadenopathy noted on exam.  No episodes of bleeding, no bruising or petechiae.  She states that appetite comes and goes. She is staying well hydrated.   ECOG Performance Status: 1 - Symptomatic but completely ambulatory  Medications:  Allergies as of 02/12/2019      Reactions   Iodinated Diagnostic Agents    UNSPECIFIED REACTION    Ioxaglate    UNSPECIFIED REACTION    Latex    UNSPECIFIED REACTION       Medication List       Accurate as of February 12, 2019 11:33 AM. Always use your most recent med list.        albuterol 108 (90 Base) MCG/ACT inhaler Commonly known as:  VENTOLIN HFA Inhale 2 puffs into the lungs every 6 (six) hours as needed.   allopurinol 100  MG tablet Commonly known as:  ZYLOPRIM Take 100 mg by mouth daily.   amLODipine 5 MG tablet Commonly known as:  NORVASC Take 5 mg by mouth daily.   Ensure Take 1 Can by mouth daily.   folic acid 1 MG tablet Commonly known as:  FOLVITE Take 1 mg by mouth daily.   gabapentin 300 MG capsule Commonly known as:  NEURONTIN Take 300 mg by mouth 2 (two) times daily.   labetalol 100 MG tablet Commonly known as:  NORMODYNE Take 100 mg by mouth 2 (two) times daily.   levETIRAcetam 500 MG tablet Commonly known as:  KEPPRA Take 1 tablet (500 mg total) by mouth daily AND 1 tablet (500 mg total) every Monday, Wednesday, and Friday at 6 PM.   loratadine 10 MG tablet Commonly known as:  CLARITIN Take 10 mg by mouth daily. X 10 days   Neoral 25 MG capsule Generic drug:  cycloSPORINE modified Take 50 mg by mouth 2 (two) times daily.   ondansetron 4 MG tablet Commonly known as:  ZOFRAN Take 4 mg by mouth every 6 (six) hours as needed for nausea or vomiting.   Oxycodone HCl 10 MG Tabs Take 1 tablet (10 mg total) by mouth every 6 (six) hours as needed for up to 10 doses (severe pain). - hold for sedation   Vitamin D (Ergocalciferol) 1.25 MG (50000 UT) Caps capsule Commonly known as:  DRISDOL Take 50,000 Units by mouth once a week. Sunday       Allergies:  Allergies  Allergen Reactions  . Iodinated Diagnostic Agents     UNSPECIFIED REACTION   . Ioxaglate     UNSPECIFIED REACTION   . Latex     UNSPECIFIED REACTION     Past Medical History, Surgical history, Social history, and Family History were reviewed and updated.  Review of Systems: All other 10 point review of systems is negative.   Physical Exam:  vitals were not taken for this visit.   Wt Readings from Last 3 Encounters:  01/01/19 139 lb 1.8 oz (63.1 kg)  12/17/18 140 lb 12.8 oz (63.9 kg)  11/29/18 134 lb (60.8 kg)    Ocular: Sclerae unicteric, pupils equal, round and reactive to light Ear-nose-throat:  Oropharynx clear, dentition fair Lymphatic: No cervical or supraclavicular adenopathy Lungs no rales or rhonchi, good excursion bilaterally Heart regular rate and rhythm, no murmur appreciated Abd soft, nontender, positive bowel sounds, no liver or spleen tip palpitations, no fluid wave  MSK no focal spinal tenderness, no joint edema Neuro: non-focal, well-oriented, appropriate affect Breasts: Deferred   Lab Results  Component Value Date   WBC 2.7 (L) 02/12/2019   HGB 9.1 (L) 02/12/2019   HCT 30.0 (L) 02/12/2019   MCV 96.5 02/12/2019   PLT 168 02/12/2019   Lab Results  Component Value Date   FERRITIN 710 (H) 10/13/2017   IRON 90 10/13/2017   TIBC 174 (L) 10/13/2017   UIBC 84 (L) 10/13/2017   IRONPCTSAT 52 10/13/2017   Lab Results  Component Value Date   RETICCTPCT 1.2 09/29/2015   RBC 3.11 (L) 02/12/2019   RETICCTABS 38.0 09/29/2015   No results found for: KPAFRELGTCHN, LAMBDASER, KAPLAMBRATIO No results found for: Kandis Cocking, IGMSERUM Lab Results  Component Value Date   TOTALPROTELP 9.4 (H) 11/19/2008   ALBUMINELP 41.1 (L) 11/19/2008   A1GS 4.0 11/19/2008   A2GS 7.1 11/19/2008   BETS 4.0 (L) 11/19/2008   BETA2SER 7.1 (H) 11/19/2008   GAMS 36.7 (H) 11/19/2008   MSPIKE NOT DET 11/19/2008   SPEI * 11/19/2008     Chemistry      Component Value Date/Time   NA 135 11/29/2018 1011   NA 137 08/10/2018   NA 140 10/13/2017 1106   NA 138 04/11/2017 1023   K 4.0 11/29/2018 1011   K 5.3 no visable hemolysis (H) 10/13/2017 1106   K 5.2 No visable hemolysis (H) 04/11/2017 1023   CL 98 10/05/2018 0409   CL 104 10/13/2017 1106   CO2 28 10/05/2018 0409   CO2 17 (L) 10/13/2017 1106   CO2 19 (L) 04/11/2017 1023   BUN 8 10/05/2018 0409   BUN 1 (A) 08/10/2018   BUN 58 (H) 10/13/2017 1106   BUN 33.4 (H) 04/11/2017 1023   CREATININE 2.82 (H) 10/05/2018 0409   CREATININE 5.16 (HH) 10/03/2018 1338   CREATININE 5.9 (HH) 10/13/2017 1106   CREATININE 1.8 (H) 04/11/2017 1023    GLU 92 08/10/2018      Component Value Date/Time   CALCIUM 7.9 (L) 10/05/2018 0409   CALCIUM 8.4 10/13/2017 1106   CALCIUM 9.0 04/11/2017 1023   ALKPHOS 66 10/04/2018 0535   ALKPHOS 68 10/13/2017 1106   ALKPHOS 80 04/11/2017 1023   AST 17 10/04/2018 0535   AST 13 (L) 10/03/2018 1338   AST 16 04/11/2017 1023   ALT <5 10/04/2018 0535   ALT 1 10/03/2018 1338   ALT  15 10/13/2017 1106   ALT 8 04/11/2017 1023   BILITOT 0.4 10/04/2018 0535   BILITOT 0.4 10/03/2018 1338   BILITOT 0.54 04/11/2017 1023       Impression and Plan: Ms. Zirkelbach is a very pleasant 63 yo African American female with anemia of chronic renal disease. She is now on hemodialysis 3 days a week and receives Aranesp weekly with treatment so we no longer need to give.  We will plan to see her back in 6 months for follow-up.  She will contact our office with any questions or concerns. We can certainly see her sooner if need be.   Laverna Peace, NP 4/21/202011:33 AM

## 2019-02-13 ENCOUNTER — Telehealth: Payer: Self-pay | Admitting: Family

## 2019-02-13 MED ORDER — FLUTICASONE PROPIONATE 50 MCG/ACTUATION NASAL SPRAY,SUSPENSION
NASAL | 0 days
Start: 2019-02-13 — End: ?

## 2019-02-13 NOTE — Telephone Encounter (Signed)
Appointments scheduled LOS enter late/ letter/calendar mailed per 4/21 los

## 2019-02-13 NOTE — Telephone Encounter (Signed)
No LOS 4/21

## 2019-02-18 NOTE — Unmapped (Signed)
Dialysis center sent mailer results on 02/13/2019 red top tube to [facility].  Katherine Norton from [facility] called let her know we do not need it and she can discard.  Let her know we need csa level and that red top can't be used for that.

## 2019-02-21 NOTE — Unmapped (Signed)
error 

## 2019-03-06 NOTE — Unmapped (Signed)
[facility] Refill Coordination Note    Specialty Medication(s) to be Shipped:   Transplant: Neoral 25mg      Katherine Norton, DOB: [birth year]  Phone: [phone] (home)     All above HIPAA information was verified with patient.     Completed refill call assessment today to schedule patient's medication shipment from the [facility] [phone]).       Specialty medication(s) and dose(s) confirmed: Regimen is correct and unchanged.   Changes to medications: Katherine Norton reports no changes reported at this time.  Changes to insurance: No  Questions for the pharmacist: No    Confirmed patient received Welcome Packet with first shipment. The patient will receive a drug information handout for each medication shipped and additional FDA Medication Guides as required.       DISEASE/MEDICATION-SPECIFIC INFORMATION        N/A    SPECIALTY MEDICATION ADHERENCE     Medication Adherence    Patient reported X missed doses in the last month:  0  Specialty Medication:  Neoral 25mg   Patient is on additional specialty medications:  No  Patient is on more than two specialty medications:  No       Neoral 25 mg: 9 days of medicine on hand     SHIPPING     Shipping address confirmed in Epic.     Delivery Scheduled: Yes, Expected medication delivery date: 03/14/2019.     Medication will be delivered via UPS to the home address in Epic [place].    Oluwatosin Higginson P Allena Katz   [facility] Specialty Technician

## 2019-03-13 MED FILL — NEORAL 25 MG CAPSULE: 30 days supply | Qty: 120 | Fill #6 | Status: AC

## 2019-03-13 MED FILL — NEORAL 25 MG CAPSULE: 30 days supply | Qty: 120 | Fill #6

## 2019-04-12 NOTE — Unmapped (Signed)
[facility] Refill Coordination Note    Specialty Medication(s) to be Shipped:   Transplant: Neoral 25mg      Chanin Frumkin, DOB: October 19, 1956  Phone: [phone] (home)     All above HIPAA information was verified with patient.     Completed refill call assessment today to schedule patient's medication shipment from the [facility] [phone]).       Specialty medication(s) and dose(s) confirmed: Regimen is correct and unchanged.   Changes to medications: Claudeen reports no changes reported at this time.  Changes to insurance: No  Questions for the pharmacist: No    Confirmed patient received Welcome Packet with first shipment. The patient will receive a drug information handout for each medication shipped and additional FDA Medication Guides as required.       DISEASE/MEDICATION-SPECIFIC INFORMATION        N/A    SPECIALTY MEDICATION ADHERENCE     Medication Adherence    Patient reported X missed doses in the last month:  0  Specialty Medication:  Neoral 25mg   Patient is on additional specialty medications:  No        Neoral 25 mg: 7 days of medicine on hand     SHIPPING     Shipping address confirmed in Epic.     Delivery Scheduled: Yes, Expected medication delivery date: 04/18/2019.     Medication will be delivered via UPS to the home address in [employer] [place].    Giovanne Nickolson P Allena Katz   [facility] Specialty Technician

## 2019-04-17 MED FILL — NEORAL 25 MG CAPSULE: 30 days supply | Qty: 120 | Fill #7

## 2019-04-17 MED FILL — NEORAL 25 MG CAPSULE: 30 days supply | Qty: 120 | Fill #7 | Status: AC

## 2019-04-26 LAB — CBC W/ DIFFERENTIAL
BANDED NEUTROPHILS ABSOLUTE COUNT: 0 10*3/uL (ref 0.0–0.1)
BASOPHILS RELATIVE PERCENT: 0 %
EOSINOPHILS ABSOLUTE COUNT: 0 10*3/uL (ref 0.0–0.4)
EOSINOPHILS RELATIVE PERCENT: 1 %
HEMOGLOBIN: 13.5 g/dL (ref 11.1–15.9)
IMMATURE GRANULOCYTES: 0 %
LYMPHOCYTES ABSOLUTE COUNT: 0.9 10*3/uL (ref 0.7–3.1)
LYMPHOCYTES RELATIVE PERCENT: 28 %
MEAN CORPUSCULAR HEMOGLOBIN CONC: 34.4 g/dL (ref 31.5–35.7)
MEAN CORPUSCULAR HEMOGLOBIN: 30.1 pg (ref 26.6–33.0)
MEAN CORPUSCULAR VOLUME: 88 fL (ref 79–97)
MONOCYTES ABSOLUTE COUNT: 0.4 10*3/uL (ref 0.1–0.9)
NEUTROPHILS ABSOLUTE COUNT: 2 10*3/uL (ref 1.4–7.0)
NEUTROPHILS RELATIVE PERCENT: 60 %
PLATELET COUNT: 123 10*3/uL — ABNORMAL LOW (ref 150–450)
RED BLOOD CELL COUNT: 4.48 x10E6/uL (ref 3.77–5.28)
RED CELL DISTRIBUTION WIDTH: 13.7 % (ref 11.7–15.4)
WHITE BLOOD CELL COUNT: 3.3 10*3/uL — ABNORMAL LOW (ref 3.4–10.8)

## 2019-04-26 LAB — COMPREHENSIVE METABOLIC PANEL
A/G RATIO: 1 — ABNORMAL LOW (ref 1.2–2.2)
ALBUMIN: 3.9 g/dL (ref 3.8–4.8)
ALKALINE PHOSPHATASE: 131 IU/L — ABNORMAL HIGH (ref 39–117)
ALT (SGPT): 9 IU/L (ref 0–32)
AST (SGOT): 17 IU/L (ref 0–40)
BLOOD UREA NITROGEN: 15 mg/dL (ref 8–27)
CALCIUM: 9.2 mg/dL (ref 8.7–10.3)
CHLORIDE: 93 mmol/L — ABNORMAL LOW (ref 96–106)
CO2: 25 mmol/L (ref 20–29)
CREATININE: 3.73 mg/dL — ABNORMAL HIGH (ref 0.57–1.00)
GLUCOSE: 111 mg/dL — ABNORMAL HIGH (ref 65–99)
POTASSIUM: 5.1 mmol/L (ref 3.5–5.2)
SODIUM: 135 mmol/L (ref 134–144)
TOTAL PROTEIN: 7.8 g/dL (ref 6.0–8.5)

## 2019-04-26 LAB — PHOSPHORUS, SERUM: Phosphate:MCnc:Pt:Ser/Plas:Qn:: 4.2

## 2019-04-26 LAB — GAMMA GLUTAMYL TRANSFERASE: Gamma glutamyl transferase:CCnc:Pt:Ser/Plas:Qn:: 14

## 2019-04-26 LAB — RED CELL DISTRIBUTION WIDTH: Erythrocyte distribution width:Ratio:Pt:RBC:Qn:Automated count: 13.7

## 2019-04-26 LAB — MAGNESIUM: Magnesium:MCnc:Pt:Ser/Plas:Qn:: 1.9

## 2019-04-26 LAB — BILIRUBIN DIRECT: Bilirubin.glucuronidated+Bilirubin.albumin bound:MCnc:Pt:Ser/Plas:Qn:: 0.16

## 2019-04-26 LAB — POTASSIUM: Potassium:SCnc:Pt:Ser/Plas:Qn:: 5.1

## 2019-04-27 LAB — CYCLOSPORINE, LC-MS/MS: Cyclosporine:MCnc:Pt:Bld:Qn:LC/MS/MS: 65 — ABNORMAL LOW

## 2019-04-30 MED ORDER — NEORAL 25 MG CAPSULE
8 refills | 0 days | Status: CP
Start: 2019-04-30 — End: 2019-06-13

## 2019-04-30 NOTE — Unmapped (Signed)
Patient to be seen by NP Martin-Velez on Thursday 05/02/2019.  Patient confirmed she would prefer to be seen in person on Thursday.  Stated she did not need to see me in person.  Prepped clinic by asking COVID-19 symptoms which patient denied.  Reviewed current medications with patient, problem list, and allergies.  Patient Denies use of alcohol, tobacco and controlled substances.       Patient asked if she could have prevented the July 2019 hospitalization.  Reviewed the chart and let her know from what I could tell she could not have.  Sent message to NP Martin-Velez to make her aware patient would like her input about this also.      Patient taking Brand Neoral. Sent e-script for transplant medications to preferred pharmacy.  Reviewed lab and reordered standing transplant labs.

## 2019-05-01 NOTE — Unmapped (Signed)
FOLLOW UP ANNUAL LIVER CLINIC NOTE     Patient Name: Katherine Norton  Medical Record Number: [id number]  Date of Service: 05/02/2019    Referring Physician: Tressia Danas   Current complaint: Follow up Annual Liver    Assessment/Plan:     Katherine Norton is a 63 y.o. 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 Recent LFT testing/images WNL.  Immunosuppressive monotherapy goal (40-80). Continue neoral 50mg  BID.    Encouraged her to follow up with HD center and vascular surgeon locally to see if she is a candidate for interposition graft given concern for recurrent extravasation, and risk for long term chest catheter.     Although significant hospitalization, ARDS etc, patient has completely returned to baseline functional status and is non-frail. Patient is extremely motivated to pursue kidney transplant, she has support in her adult children and has been very stable on HD for the last 6+ months. Placed referral for kidney transplant.     HEALTH MAINTENANCE:   Immunization History   Administered Date(s) Administered   ??? Influenza Virus Vaccine, unspecified formulation 07/01/2015, 08/22/2016, 07/06/2017   ??? PNEUMOCOCCAL POLYSACCHARIDE 23 08/22/2018   ??? Pneumococcal Conjugate 13-Valent 07/06/2017   ??? SHINGRIX-ZOSTER VACCINE (HZV), RECOMBINANT,SUB-UNIT,ADJUVANTED IM 01/15/2018     Return to clinic: 1 year  Labs: Q 6-8 weeks    I spent a total of 25 minutes of which 50% was spent in counseling and coordination of care.    Subjective:     HPI: Katherine Norton is a 63 y.o. 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 She returns today for her annual follow up. Her PMH is additionally significant for CKD now progressed to ESRD with start of iHD during acute hospitalization for hypoxic and hypercapnic respiratory failure secondary to PNA with ARDS, requiring temporary tracheostomy, PEA arrest peri-intubation #1 (CPR and epi, ROSC within 1 min) 06/2018. She has since recovered well, from a respiratory standpoint, but has remained on hemodialysis. She has a LUE fistula that she reports always swells and has been utilizing R chest wall cathter since discharge from hospital.      Denies fever, chills, arthralgias, weight loss/gain, and fatigue. Denies chest pain, SOB, N/V/D, constipation or abdominal pain. No acute complaint today.    Past Medical History:   Diagnosis Date   ??? Chronic kidney disease    ??? Cirrhosis (CMS-HCC)    ??? HTN (hypertension)        Past Surgical History:   Procedure Laterality Date   ??? BRONCHOSCOPY  06/03/2018        ??? HYSTERECTOMY     ??? 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 PR TRACHEOSTOMY, PLANNED Midline 06/26/2018    Procedure: TRACHEOSTOMY PLANNED (SEPART PROC);  Surgeon: Theodosia Blender, MD;  Location: MAIN OR [facility];  Service: ENT   ??? TONSILLECTOMY         Family History   Problem Relation Age of Onset   ??? Hypertension Mother    ??? Hypertension Father    ??? Prostate cancer Father    ??? Kidney disease Neg Hx    ??? Lupus Sister    ??? Lupus Brother        Social History     Socioeconomic History   ??? Marital status: Single     Spouse name: Not on file   ??? Number of children: Not on file   ??? Years of education: Not  on file   ??? Highest education level: Not on file   Occupational History   ??? Not on file   Social Needs   ??? Financial resource strain: Not on file   ??? Food insecurity     Worry: Not on file     Inability: Not on file   ??? Transportation needs     Medical: Not on file     Non-medical: Not on file   Tobacco Use   ??? Smoking status: Never Smoker   ??? Smokeless tobacco: Never Used   Substance and Sexual Activity   ??? Alcohol use: No     Alcohol/week: 0.0 standard drinks   ??? Drug use: No   ??? Sexual activity: Not on file   Lifestyle   ??? Physical activity     Days per week: Not on file     Minutes per session: Not on file   ??? Stress: Not on file   Relationships   ??? Social Wellsite geologist on phone: Not on file     Gets together: Not on file     Attends religious service: Not on file     Active member of club or organization: Not on file     Attends meetings of clubs or organizations: Not on file     Relationship status: Not on file   Other Topics Concern   ??? Not on file   Social History Narrative   ??? Not on file       REVIEW OF SYSTEMS:   The balance of 10/12 systems is negative with the exception of HPI.    Objective:     MEDICATIONS:  Allergies   Allergen Reactions   ??? Iodinated Contrast Media    ??? Ioxaglate Sodium    ??? Latex        Current Outpatient Medications   Medication Sig Dispense Refill   ??? acetaminophen (TYLENOL) 325 MG tablet 2 tablets (650 mg total) by G-tube route every six (6) hours as needed.  0   ??? albuterol 2.5 mg/0.5 mL nebulizer solution Inhale 0.5 mL (2.5 mg total) by nebulization every six (6) hours as needed for wheezing or shortness of breath. 30 each 12   ??? amLODIPine (NORVASC) 5 MG tablet Take 5 mg by mouth daily.     ??? calcitonin, salmon, (MIACALCIN) 200 unit/actuation nasal spray 1 spray by Alternating Nares route once daily.      ??? chlorhexidine (PERIDEX) 0.12 % solution 5 mL by Mouth route two (2) times a day. 1 Bottle 0   ??? dextrose (D10W) 10% Soln bolus Infuse 125 mL into a venous catheter every thirty (30) minutes as needed.  0   ??? dextrose 5 % and sodium chloride 0.9 % (D5-NS) infu Infuse 25 mL/hr into a venous catheter continuous as needed (if tube feed held or NPO).  0   ??? famotidine (PEPCID) 20 MG tablet 1 tablet (20 mg total) by G-tube route daily. (Patient taking differently: Take 20 mg by mouth daily. ) 30 tablet 0   ??? gentamicin 1 mg/mL, sodium citrate 4% Gent locks with dialysis 2.4 mL 4   ??? heparin sodium,porcine (HEPARIN, PORCINE,) 1,000 unit/mL 1000 unit/mL injection This heparin is for dialysis use ONLY. RN if you are unable to aspirate from both lumes, call provider piror to using the catheter. 1 mL 0   ??? heparin sodium,porcine (HEPARIN, PORCINE,) 5,000 unit/mL injection Inject 1 mL (5,000 Units total) under the skin  every eight (8) hours. 1 mL 0   ??? melatonin 3 mg Tab Take 1 tablet (3 mg total) by mouth nightly.  0   ??? NEORAL 25 mg capsule TAKE 2 CAPSULES (50MG ) BY MOUTH TWICE DAILY 120 each 8   ??? oxyCODONE (ROXICODONE) 5 MG immediate release tablet 1 tablet (5 mg total) by G-tube route every six (6) hours as needed. for up to 5 days (Patient taking differently: Take 5 mg by mouth every six (6) hours as needed. ) 10 tablet 0   ??? sodium chloride 3 % nebulizer solution Inhale 4 mL by nebulization 3 (three) times a day. 750 mL 12   ??? insulin lispro (HUMALOG) 100 unit/mL injection Inject 0-0.12 mL (0-12 Units total) under the skin Every six (6) hours. (Patient taking differently: Inject 0-12 Units under the skin Three (3) times a day before meals. ) 1440 Units 0     No current facility-administered medications for this visit.          PHYSICAL EXAM:  BP 132/82 (BP Site: L Arm, BP Position: Sitting, BP Cuff Size: Medium)  - Pulse 80  - Temp 36.5 ??C (97.7 ??F) (Tympanic)  - Ht 152.4 cm (5')  - Wt 57.2 kg (126 lb)  - SpO2 100%  - BMI 24.61 kg/m??     General Appearance:  NAD, well appearing and well nourished.   HEENT:  [place]/AT. Well hydrated moist mucous membranes of the oral cavity. No scleral icterus. No cervical lymphadenopathy.   Pulmonary:    Normal respiratory effort. CTAB, without wheezes/crackles/rhonchi. Good air movement.    Cardiovascular:  Regular rate and rhythm, no murmur noted.   Extremities No edema. Ecchymosis to LUE with swelling to LUE above AVF   Abdomen:   Normoactive bowel sounds, abdomen soft, non-tender and not distended, no Hepatosplenomegaly or masses. Abdominal scar well healed without hernia.    Musculoskeletal: No joint tenderness, full ROM. Normal gait.    Skin: Skin color, texture, turgor normal, no rashes or lesions.   Neurologic: Grossly intact.   Psychiatric: Judgement and insight appropriate.        LAB RESULTS:  All lab results last 24 hours:    Recent Results (from the past 48 hour(s))   Cyclosporine, Trough    Collection Time: 05/02/19  9:02 AM   Result Value Ref Range    Cyclosporine, Trough 41 (L) 100 - 400 ng/mL   Gamma GT    Collection Time: 05/02/19  9:02 AM   Result Value Ref Range    GGT 14 11 - 48 U/L   Magnesium Level    Collection Time: 05/02/19  9:02 AM   Result Value Ref Range    Magnesium 1.6 1.6 - 2.2 mg/dL   Phosphorus Level    Collection Time: 05/02/19  9:02 AM   Result Value Ref Range    Phosphorus 5.4 (H) 2.9 - 4.7 mg/dL   Bilirubin, Direct    Collection Time: 05/02/19  9:02 AM   Result Value Ref Range    Bilirubin, Direct 0.20 0.00 - 0.40 mg/dL   Comprehensive Metabolic Panel    Collection Time: 05/02/19  9:02 AM   Result Value Ref Range    Sodium 138 135 - 145 mmol/L    Potassium 4.2 3.5 - 5.0 mmol/L    Chloride 94 (L) 98 - 107 mmol/L    Anion Gap 12 7 - 15 mmol/L    CO2 32.0 (H) 22.0 - 30.0 mmol/L    BUN  13 7 - 21 mg/dL    Creatinine 1.61 (H) 0.60 - 1.00 mg/dL    BUN/Creatinine Ratio 4     EGFR CKD-EPI Non-African American, Female 15 (L) >=60 mL/min/1.2m2    EGFR CKD-EPI African American, Female 17 (L) >=60 mL/min/1.9m2    Glucose 90 70 - 179 mg/dL    Calcium 8.7 8.5 - 09.6 mg/dL    Albumin 3.9 3.5 - 5.0 g/dL    Total Protein 8.4 (H) 6.5 - 8.3 g/dL    Total Bilirubin 0.6 0.0 - 1.2 mg/dL    AST 21 14 - 38 U/L    ALT 11 <35 U/L    Alkaline Phosphatase 103 38 - 126 U/L   CBC w/ Differential    Collection Time: 05/02/19  9:02 AM   Result Value Ref Range    WBC 2.3 (L) 4.5 - 11.0 10*9/L    RBC 4.19 4.00 - 5.20 10*12/L    HGB 12.4 12.0 - 16.0 g/dL    HCT 04.5 40.9 - 81.1 %    MCV 91.7 80.0 - 100.0 fL    MCH 29.4 26.0 - 34.0 pg    MCHC 32.1 31.0 - 37.0 g/dL    RDW 91.4 (H) 78.2 - 15.0 %    MPV 9.5 7.0 - 10.0 fL    Platelet 136 (L) 150 - 440 10*9/L    Neutrophils % 49.6 %    Lymphocytes % 29.9 %    Monocytes % 14.3 %    Eosinophils % 1.9 %    Basophils % 0.4 %    Absolute Neutrophils 1.2 (L) 2.0 - 7.5 10*9/L    Absolute Lymphocytes 0.7 (L) 1.5 - 5.0 10*9/L Absolute Monocytes 0.3 0.2 - 0.8 10*9/L    Absolute Eosinophils 0.0 0.0 - 0.4 10*9/L    Absolute Basophils 0.0 0.0 - 0.1 10*9/L    Large Unstained Cells 4 0 - 4 %    Hypochromasia Slight (A) Not Present         IMAGING:  Xr Chest 2 Views  Result Date: 05/02/2019  No acute airspace disease.    US Liver Transplant  Result Date: 05/02/2019  - Patent hepatic transplant vasculature. Mildly sluggish flow in the pre and post anastomotic main portal vein.   - Unchanged resistive indices in the hepatic transplant arteries, within normal limits.   - Bilateral small echogenic kidneys, compatible with medical renal disease.     _______________________________________________        Gertie Fey, DNP, APRN, FNP-C  [facility]  [address]  [place], [place]  [address]

## 2019-05-02 ENCOUNTER — Ambulatory Visit: Admit: 2019-05-02 | Discharge: 2019-05-02 | Payer: MEDICARE

## 2019-05-02 ENCOUNTER — Encounter: Admit: 2019-05-02 | Discharge: 2019-05-02 | Payer: MEDICARE

## 2019-05-02 DIAGNOSIS — Z79899 Other long term (current) drug therapy: Secondary | ICD-10-CM

## 2019-05-02 DIAGNOSIS — K769 Liver disease, unspecified: Secondary | ICD-10-CM

## 2019-05-02 DIAGNOSIS — Z944 Liver transplant status: Principal | ICD-10-CM

## 2019-05-02 DIAGNOSIS — N186 End stage renal disease: Secondary | ICD-10-CM

## 2019-05-02 DIAGNOSIS — D899 Disorder involving the immune mechanism, unspecified: Secondary | ICD-10-CM

## 2019-05-02 DIAGNOSIS — Z992 Dependence on renal dialysis: Secondary | ICD-10-CM

## 2019-05-02 LAB — BILIRUBIN DIRECT: Bilirubin.glucuronidated:MCnc:Pt:Ser/Plas:Qn:: 0.2

## 2019-05-02 LAB — COMPREHENSIVE METABOLIC PANEL
ALBUMIN: 3.9 g/dL (ref 3.5–5.0)
ALKALINE PHOSPHATASE: 103 U/L (ref 38–126)
ALT (SGPT): 11 U/L (ref <35)
ANION GAP: 12 mmol/L (ref 7–15)
BILIRUBIN TOTAL: 0.6 mg/dL (ref 0.0–1.2)
BLOOD UREA NITROGEN: 13 mg/dL (ref 7–21)
BUN / CREAT RATIO: 4
CHLORIDE: 94 mmol/L — ABNORMAL LOW (ref 98–107)
CREATININE: 3.16 mg/dL — ABNORMAL HIGH (ref 0.60–1.00)
EGFR CKD-EPI AA FEMALE: 17 mL/min/{1.73_m2} — ABNORMAL LOW (ref >=60)
EGFR CKD-EPI NON-AA FEMALE: 15 mL/min/{1.73_m2} — ABNORMAL LOW (ref >=60)
GLUCOSE RANDOM: 90 mg/dL (ref 70–179)
POTASSIUM: 4.2 mmol/L (ref 3.5–5.0)
PROTEIN TOTAL: 8.4 g/dL — ABNORMAL HIGH (ref 6.5–8.3)
SODIUM: 138 mmol/L (ref 135–145)

## 2019-05-02 LAB — MAGNESIUM: Magnesium:MCnc:Pt:Ser/Plas:Qn:: 1.6

## 2019-05-02 LAB — CBC W/ AUTO DIFF
BASOPHILS ABSOLUTE COUNT: 0 10*9/L (ref 0.0–0.1)
BASOPHILS RELATIVE PERCENT: 0.4 %
EOSINOPHILS ABSOLUTE COUNT: 0 10*9/L (ref 0.0–0.4)
EOSINOPHILS RELATIVE PERCENT: 1.9 %
HEMATOCRIT: 38.5 % (ref 36.0–46.0)
HEMOGLOBIN: 12.4 g/dL (ref 12.0–16.0)
LARGE UNSTAINED CELLS: 4 % (ref 0–4)
LYMPHOCYTES ABSOLUTE COUNT: 0.7 10*9/L — ABNORMAL LOW (ref 1.5–5.0)
LYMPHOCYTES RELATIVE PERCENT: 29.9 %
MEAN CORPUSCULAR HEMOGLOBIN CONC: 32.1 g/dL (ref 31.0–37.0)
MEAN CORPUSCULAR HEMOGLOBIN: 29.4 pg (ref 26.0–34.0)
MEAN PLATELET VOLUME: 9.5 fL (ref 7.0–10.0)
MONOCYTES ABSOLUTE COUNT: 0.3 10*9/L (ref 0.2–0.8)
MONOCYTES RELATIVE PERCENT: 14.3 %
NEUTROPHILS ABSOLUTE COUNT: 1.2 10*9/L — ABNORMAL LOW (ref 2.0–7.5)
NEUTROPHILS RELATIVE PERCENT: 49.6 %
PLATELET COUNT: 136 10*9/L — ABNORMAL LOW (ref 150–440)
RED BLOOD CELL COUNT: 4.19 10*12/L (ref 4.00–5.20)
RED CELL DISTRIBUTION WIDTH: 15.8 % — ABNORMAL HIGH (ref 12.0–15.0)
WBC ADJUSTED: 2.3 10*9/L — ABNORMAL LOW (ref 4.5–11.0)

## 2019-05-02 LAB — EOSINOPHILS RELATIVE PERCENT: Lab: 1.9

## 2019-05-02 LAB — GAMMA GLUTAMYL TRANSFERASE: Gamma glutamyl transferase:CCnc:Pt:Ser/Plas:Qn:: 14

## 2019-05-02 LAB — GLUCOSE RANDOM: Glucose:MCnc:Pt:Ser/Plas:Qn:: 90

## 2019-05-02 LAB — PHOSPHORUS: Phosphate:MCnc:Pt:Ser/Plas:Qn:: 5.4 — ABNORMAL HIGH

## 2019-05-02 LAB — CYCLOSPORINE, TROUGH: Lab: 41 — ABNORMAL LOW

## 2019-05-08 NOTE — Unmapped (Signed)
Received a referral for transplant for this patient.      Coordinator: Aundra Millet  Patient is being Re-Referred: no    63 y.o. year old patient who was referred to [facility] for Transplant  ESRD due to Unknown at this time  Dialysis: MWF  Comorbidities: HTN,   Surgical Hx: Hysterectomy, Liver Txp for HCV cirrhosis in 1996, tonsillectomy   BMI: 24    Patient Speaks English  Outreach: None    Candidate: Track 2  Scheduling Instructions:  Testing:   ?? Day 1 Bundle  ?? Labs  ?? EKG  ?? Chest X-Ray    Consults:  ?? Day 1 Bundle  ?? Orientation/Education Class    Include Blurb in Letter:  ?? Colonoscopy  ?? Mammogram  ?? PPD

## 2019-05-09 NOTE — Unmapped (Signed)
Received referral to verify pt's insurance.   Pt has active coverage with [place] [employer] with prescription plan.   Prior auth for kidney transplant eval is not required.    Pt is financially clear to proceed with Kidney transplant eval process.

## 2019-05-17 NOTE — Unmapped (Signed)
[facility] Refill Coordination Note  Medication: Neoral 25mg     Unable to reach patient to schedule shipment for medication being filled at [facility]. Mailbox is full so unable to leave message.  As this is the 3rd unsuccessful attempt to reach the patient, no additional phone call attempts will be made at this time.      Phone numbers attempted: [phone] (H)    Last scheduled delivery: 04/17/2019    Please call the [facility] at [phone] (option [phone]) should you have any further questions.      Thanks,  [facility] Specialty Team

## 2019-05-20 MED ORDER — NEORAL 25 MG CAPSULE
ORAL_CAPSULE | Freq: Two times a day (BID) | ORAL | 3 refills | 90 days | Status: CP
Start: 2019-05-20 — End: 2019-06-13

## 2019-05-21 NOTE — Unmapped (Signed)
Neoral was scheduled to ship 05/20/2019, but the patient received medication from [facility]. I called the patient to confirm and she stated that she did pick it up from [facility]; removed from work order.

## 2019-05-29 MED ORDER — DIALYVITE 800 WITH ZINC 15 0.8 MG-15 MG TABLET
ORAL | 0 days
Start: 2019-05-29 — End: ?

## 2019-06-12 NOTE — Unmapped (Addendum)
Coordinator spoke with patient about changing to generic cyclosporine and patient agreed to change.           [facility] Clinical Assessment & Refill Coordination Note    Katherine Norton, [place]: [birth year]  Phone: [phone] (home)     All above HIPAA information was verified with patient.     Specialty Medication(s):   Transplant: Neoral 25mg      Current Outpatient Medications   Medication Sig Dispense Refill   ??? acetaminophen (TYLENOL) 325 MG tablet 2 tablets (650 mg total) by G-tube route every six (6) hours as needed.  0   ??? albuterol 2.5 mg/0.5 mL nebulizer solution Inhale 0.5 mL (2.5 mg total) by nebulization every six (6) hours as needed for wheezing or shortness of breath. 30 each 12   ??? amLODIPine (NORVASC) 5 MG tablet Take 5 mg by mouth daily.     ??? calcitonin, salmon, (MIACALCIN) 200 unit/actuation nasal spray 1 spray by Alternating Nares route once daily.      ??? chlorhexidine (PERIDEX) 0.12 % solution 5 mL by Mouth route two (2) times a day. 1 Bottle 0   ??? dextrose (D10W) 10% Soln bolus Infuse 125 mL into a venous catheter every thirty (30) minutes as needed.  0   ??? dextrose 5 % and sodium chloride 0.9 % (D5-NS) infu Infuse 25 mL/hr into a venous catheter continuous as needed (if tube feed held or NPO).  0   ??? famotidine (PEPCID) 20 MG tablet 1 tablet (20 mg total) by G-tube route daily. (Patient taking differently: Take 20 mg by mouth daily. ) 30 tablet 0   ??? gentamicin 1 mg/mL, sodium citrate 4% Gent locks with dialysis 2.4 mL 4   ??? heparin sodium,porcine (HEPARIN, PORCINE,) 1,000 unit/mL 1000 unit/mL injection This heparin is for dialysis use ONLY. RN if you are unable to aspirate from both lumes, call provider piror to using the catheter. 1 mL 0   ??? heparin sodium,porcine (HEPARIN, PORCINE,) 5,000 unit/mL injection Inject 1 mL (5,000 Units total) under the skin every eight (8) hours. 1 mL 0   ??? insulin lispro (HUMALOG) 100 unit/mL injection Inject 0-0.12 mL (0-12 Units total) under the skin Every six (6) hours. (Patient taking differently: Inject 0-12 Units under the skin Three (3) times a day before meals. ) 1440 Units 0   ??? melatonin 3 mg Tab Take 1 tablet (3 mg total) by mouth nightly.  0   ??? NEORAL 25 mg capsule TAKE 2 CAPSULES (50MG ) BY MOUTH TWICE DAILY 120 each 8   ??? NEORAL 25 mg capsule Take 2 capsules (50 mg total) by mouth Two (2) times a day. 360 capsule 3   ??? oxyCODONE (ROXICODONE) 5 MG immediate release tablet 1 tablet (5 mg total) by G-tube route every six (6) hours as needed. for up to 5 days (Patient taking differently: Take 5 mg by mouth every six (6) hours as needed. ) 10 tablet 0   ??? sodium chloride 3 % nebulizer solution Inhale 4 mL by nebulization 3 (three) times a day. 750 mL 12     No current facility-administered medications for this visit.         Changes to medications: Ita reports no changes at this time.    Allergies   Allergen Reactions   ??? Iodinated Contrast Media    ??? Ioxaglate Sodium    ??? Latex        Changes to allergies: No  SPECIALTY MEDICATION ADHERENCE     Neoral 25mg   : 7 days of medicine on hand - was filled last month at [facility], wants back with [name] this month      Medication Adherence    Patient reported X missed doses in the last month: 0  Specialty Medication: neoral 25mg           Specialty medication(s) dose(s) confirmed: Regimen is correct and unchanged.     Are there any concerns with adherence? No    Adherence counseling provided? Not needed    CLINICAL MANAGEMENT AND INTERVENTION      Clinical Benefit Assessment:    Do you feel the medicine is effective or helping your condition? Yes    Clinical Benefit counseling provided? Not needed    Adverse Effects Assessment:    Are you experiencing any side effects? No    Are you experiencing difficulty administering your medicine? No    Quality of Life Assessment:    How many days over the past month did your transplant  keep you from your normal activities? For example, brushing your teeth or getting up in the morning. 0    Have you discussed this with your provider? Not needed    Therapy Appropriateness:    Is therapy appropriate? Yes, therapy is appropriate and should be continued    DISEASE/MEDICATION-SPECIFIC INFORMATION      N/A    PATIENT SPECIFIC NEEDS     ? Does the patient have any physical, cognitive, or cultural barriers? No    ? Is the patient high risk? No     ? Does the patient require a Care Management Plan? No     ? Does the patient require physician intervention or other additional services (i.e. nutrition, smoking cessation, social work)? No      SHIPPING     Specialty Medication(s) to be Shipped:   Transplant: cyclosporine 25mg     Other medication(s) to be shipped: n/a     Changes to insurance: No    Delivery Scheduled: Yes, Expected medication delivery date: 06/14/2019.     Medication will be delivered via UPS to the confirmed home address in [facility].    The patient will receive a drug information handout for each medication shipped and additional FDA Medication Guides as required.  Verified that patient has previously received a Conservation officer, historic buildings.    All of the patient's questions and concerns have been addressed.    Thad Ranger   [facility] Specialty Pharmacist

## 2019-06-13 MED ORDER — CYCLOSPORINE MODIFIED 25 MG CAPSULE
ORAL_CAPSULE | Freq: Two times a day (BID) | ORAL | 3 refills | 90 days | Status: CP
Start: 2019-06-13 — End: 2020-06-12
  Filled 2019-06-14: qty 360, 90d supply, fill #0

## 2019-06-13 NOTE — Unmapped (Signed)
Per PharmD Mincemoyer switched patient to generic cyclosporine.  Patient agreed to do so and and to repeat labs in one and two weeks.

## 2019-06-14 MED FILL — CYCLOSPORINE MODIFIED 25 MG CAPSULE: 90 days supply | Qty: 360 | Fill #0 | Status: AC

## 2019-06-18 ENCOUNTER — Ambulatory Visit (INDEPENDENT_AMBULATORY_CARE_PROVIDER_SITE_OTHER): Payer: Medicaid Other | Admitting: Diagnostic Neuroimaging

## 2019-06-18 ENCOUNTER — Encounter: Payer: Self-pay | Admitting: Diagnostic Neuroimaging

## 2019-06-18 ENCOUNTER — Other Ambulatory Visit: Payer: Self-pay

## 2019-06-18 VITALS — BP 110/80 | HR 80 | Temp 97.1°F | Ht 60.0 in | Wt 124.4 lb

## 2019-06-18 DIAGNOSIS — R569 Unspecified convulsions: Secondary | ICD-10-CM

## 2019-06-18 DIAGNOSIS — Z992 Dependence on renal dialysis: Secondary | ICD-10-CM | POA: Diagnosis not present

## 2019-06-18 DIAGNOSIS — E1122 Type 2 diabetes mellitus with diabetic chronic kidney disease: Secondary | ICD-10-CM | POA: Diagnosis not present

## 2019-06-18 DIAGNOSIS — N186 End stage renal disease: Secondary | ICD-10-CM

## 2019-06-18 MED ORDER — LEVETIRACETAM 500 MG PO TABS: ORAL_TABLET | ORAL | 12 refills | Status: DC

## 2019-06-18 NOTE — Progress Notes (Signed)
[facility]  PATIENT: Tami [name] DOB: 12-03-1955  REFERRING CLINICIAN: ER  HISTORY FROM: patient REASON FOR VISIT: follow up   HISTORICAL  CHIEF COMPLAINT:  Chief Complaint  Patient presents with  . Seizures    rm 6 FU, "no seizures"    HISTORY OF PRESENT ILLNESS:   UPDATE (06/18/19, VRP): Since last visit, doing well. Symptoms are stable. No seizures. No alleviating or aggravating factors. Tolerating levetiracetam.    PRIOR HPI (12/17/18): 63 year old female here for evaluation of seizures.  History of liver transplant, hypertension, end-stage renal disease on hemodialysis.  10/03/2018 patient was at office visit, about to give blood when all of a sudden she became unresponsive and had some jerking movements.  Staff could not feel a pulse and started CPR.  Patient was immediately attended to by the rapid response team.  She was taken downstairs to the emergency room.  She may have had some type of gaze deviation initially resolved spontaneously.  While in the emergency room she had a second event with tonic-clonic seizures.  She was given IV Ativan and treated with Keppra.  She was admitted for further evaluation.  MRI, MRA of the head were obtained which showed no acute findings.  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  EEG was unremarkable.  Patient was discharged on no antiseizure medication.  Since that time patient is doing well.  No further events.    REVIEW OF SYSTEMS: Full 14 system review of systems performed and negative with exception of: as per HPI.   ALLERGIES: Allergies  Allergen Reactions  . Iodinated Diagnostic Agents     UNSPECIFIED REACTION   . Ioxaglate     UNSPECIFIED REACTION   . Latex     UNSPECIFIED REACTION     HOME MEDICATIONS: Outpatient Medications Prior to Visit  Medication Sig Dispense Refill  . albuterol (PROVENTIL HFA;VENTOLIN HFA) 108 (90 Base) MCG/ACT inhaler Inhale 2  puffs into the lungs every 6 (six) hours as needed.     Marland Kitchen allopurinol (ZYLOPRIM) 100 MG tablet Take 100 mg by mouth daily.  5  . amLODipine (NORVASC) 5 MG tablet Take 5 mg by mouth daily.    Marland Kitchen amoxicillin-clavulanate (AUGMENTIN) 500-125 MG tablet Take 1 tablet by mouth daily.    Lorin Picket 1 GM 210 MG(Fe) tablet TAKE 2 TABLETS BY MOUTH THREE TIMES A DAY WITH MEALS.   SWALLOW WHOLE, DO NOT CHEW OR CRUSH MEDICATION   AND 1 TABLET WITH A SNACK    . azithromycin (ZITHROMAX) 250 MG tablet TAKE 2 TABLETS BY MOUTH TODAY, THEN TAKE 1 TABLET DAILY FOR 4 DAYS    . cycloSPORINE modified (NEORAL) 25 MG capsule Take 50 mg by mouth 2 (two) times daily.     Marland Kitchen ENSURE (ENSURE) Take 1 Can by mouth daily.     . folic acid (FOLVITE) 1 MG tablet Take 1 mg by mouth daily.    Marland Kitchen gabapentin (NEURONTIN) 100 MG capsule TAKE 2 CAPSULE BY MOUTH TWICE A DAY AS NEEDED TAKE FOR PAIN    . gabapentin (NEURONTIN) 300 MG capsule Take 300 mg by mouth 2 (two) times daily.    Marland Kitchen labetalol (NORMODYNE) 100 MG tablet Take 100 mg by mouth 2 (two) times daily.    Marland Kitchen levETIRAcetam (KEPPRA) 500 MG tablet Take 1 tablet (500 mg total) by mouth daily AND 1 tablet (500 mg total) every Monday, Wednesday, and Friday at 6 PM. 50 tablet 12  .  lidocaine-prilocaine (EMLA) cream APPLY SMALL AMOUNT TO ACCESS SITE (AVF) 30 - 60 MINUTES BEFORE DIALYSIS. COVER WITH OCCLUSIVE DRESSING ( SARAN WRAP)    . metoprolol succinate (TOPROL-XL) 50 MG 24 hr tablet 1 (ONE) TABLET TABLET ER 24HR DAILY    . ondansetron (ZOFRAN) 4 MG tablet Take 4 mg by mouth every 6 (six) hours as needed for nausea or vomiting.    . Oxycodone HCl 10 MG TABS Take 1 tablet (10 mg total) by mouth every 6 (six) hours as needed for up to 10 doses (severe pain). - hold for sedation (Patient taking differently: Take 10 mg by mouth every 4 (four) hours as needed (severe pain). - hold for sedation) 10 tablet 0  . promethazine (PHENERGAN) 12.5 MG tablet Take 12.5 mg by mouth daily.    Marland Kitchen RENAGEL 800 MG  tablet TAKE 2 TABLETS BY MOUTH THREE TIMES DAILY WITH MEALS    . Vitamin D, Ergocalciferol, (DRISDOL) 1.25 MG (50000 UT) CAPS capsule Take 50,000 Units by mouth once a week. Sunday    . loratadine (CLARITIN) 10 MG tablet Take 10 mg by mouth daily. X 10 days     No facility-administered medications prior to visit.     PAST MEDICAL HISTORY: Past Medical History:  Diagnosis Date  . Anemia   . Arthritis   . ESRD (end stage renal disease) on dialysis (HCC)    "MWF; [place]" (10/04/2018)  . Gout   . Hepatitis, autoimmune (HCC) 12/08/2011  . Hypertension   . Seizures (HCC) 10/03/2018    PAST SURGICAL HISTORY: Past Surgical History:  Procedure Laterality Date  . ABDOMINAL HYSTERECTOMY    . AV FISTULA PLACEMENT Left 10/02/2018   Procedure: Creation of left arm Brachiocephalic Fistula;  Surgeon: Cain, Brandon Christopher, MD;  Location: [facility] OR;  Service: Vascular;  Laterality: Left;  . FISTULA SUPERFICIALIZATION Left 11/29/2018   Procedure: TRANSLOCATION/FISTULA SUPERFICIALIZATION OF LEFT ARM FISTULA;  Surgeon: Clark, Christopher J, MD;  Location: [facility] OR;  Service: Vascular;  Laterality: Left;  . LIVER TRANSPLANT     19 96  . TONSILLECTOMY      FAMILY HISTORY: Family History  Problem Relation Age of Onset  . Stroke Mother   . Cancer Father   . Lupus Sister        1 sister  . Hypertension Sister   . Cancer Brother        prostate- 1 brother  . Lupus Brother     SOCIAL HISTORY: Social History   Socioeconomic History  . Marital status: Divorced    Spouse name: Not on file  . Number of children: 2  . Years of education: some college  . Highest education level: Not on file  Occupational History    Comment: na  Social Needs  . Financial resource strain: Not on file  . Food insecurity    Worry: Not on file    Inability: Not on file  . Transportation needs    Medical: Not on file    Non-medical: Not on file  Tobacco Use  . Smoking status: Never Smoker  . Smokeless tobacco:  Never Used  . Tobacco comment: never used tobacco  Substance and Sexual Activity  . Alcohol use: Not Currently    Alcohol/week: 0.0 standard drinks  . Drug use: No  . Sexual activity: Not on file  Lifestyle  . Physical activity    Days per week: Not on file    Minutes per session: Not on file  . Stress: Not  on file  Relationships  . Social Herbalist on phone: Not on file    Gets together: Not on file    Attends religious service: Not on file    Active member of club or organization: Not on file    Attends meetings of clubs or organizations: Not on file    Relationship status: Not on file  . Intimate partner violence    Fear of current or ex partner: Not on file    Emotionally abused: Not on file    Physically abused: Not on file    Forced sexual activity: Not on file  Other Topics Concern  . Not on file  Social History Narrative   Lives with son   Caffeine - tea , little      PHYSICAL EXAM  GENERAL EXAM/CONSTITUTIONAL: Vitals:  Vitals:   06/18/19 1104  BP: 110/80  Pulse: 80  Temp: (!) 97.1 F (36.2 C)  Weight: 124 lb 6.4 oz (56.4 kg)  Height: 5' (1.524 m)   Body mass index is 24.3 kg/m. Wt Readings from Last 3 Encounters:  06/18/19 124 lb 6.4 oz (56.4 kg)  02/12/19 140 lb 2 oz (63.6 kg)  01/01/19 139 lb 1.8 oz (63.1 kg)    Patient is in no distress; well developed, nourished and groomed; neck is supple  CARDIOVASCULAR:  Examination of carotid arteries is normal; no carotid bruits  Regular rate and rhythm, no murmurs  Examination of peripheral vascular system by observation and palpation is normal  EYES:  Ophthalmoscopic exam of optic discs and posterior segments is normal; no papilledema or hemorrhages No exam data present  MUSCULOSKELETAL:  Gait, strength, tone, movements noted in Neurologic exam below  NEUROLOGIC: MENTAL STATUS:  No flowsheet data found.  awake, alert, oriented to person, place and time  recent and remote  memory intact  normal attention and concentration  language fluent, comprehension intact, naming intact  fund of knowledge appropriate  CRANIAL NERVE:   2nd - no papilledema on fundoscopic exam  2nd, 3rd, 4th, 6th - pupils equal and reactive to light, visual fields full to confrontation, extraocular muscles intact, no nystagmus  5th - facial sensation symmetric  7th - facial strength symmetric  8th - hearing intact  9th - palate elevates symmetrically, uvula midline  11th - shoulder shrug symmetric  12th - tongue protrusion midline  MOTOR:   normal bulk and tone, full strength in the BUE, BLE  SENSORY:   normal and symmetric to light touch, temperature, vibration  COORDINATION:   finger-nose-finger, fine finger movements normal  REFLEXES:   deep tendon reflexes TRACE and symmetric  GAIT/STATION:   narrow based gait; uses single point cane     DIAGNOSTIC DATA (LABS, IMAGING, TESTING) - I reviewed patient records, labs, notes, testing and imaging myself where available.  Lab Results  Component Value Date   WBC 2.7 (L) 02/12/2019   HGB 9.1 (L) 02/12/2019   HCT 30.0 (L) 02/12/2019   MCV 96.5 02/12/2019   PLT 168 02/12/2019      Component Value Date/Time   NA 138 02/12/2019 1103   NA 137 08/10/2018   NA 140 10/13/2017 1106   NA 138 04/11/2017 1023   K 3.3 (L) 02/12/2019 1103   K 5.3 no visable hemolysis (H) 10/13/2017 1106   K 5.2 No visable hemolysis (H) 04/11/2017 1023   CL 94 (L) 02/12/2019 1103   CL 104 10/13/2017 1106   CO2 34 (H) 02/12/2019 1103  CO2 17 (L) 10/13/2017 1106   CO2 19 (L) 04/11/2017 1023   GLUCOSE 96 02/12/2019 1103   GLUCOSE 116 10/13/2017 1106   BUN 11 02/12/2019 1103   BUN 1 (A) 08/10/2018   BUN 58 (H) 10/13/2017 1106   BUN 33.4 (H) 04/11/2017 1023   CREATININE 3.69 (HH) 02/12/2019 1103   CREATININE 5.9 (HH) 10/13/2017 1106   CREATININE 1.8 (H) 04/11/2017 1023   CALCIUM 8.3 (L) 02/12/2019 1103   CALCIUM 8.4  10/13/2017 1106   CALCIUM 9.0 04/11/2017 1023   PROT 7.8 02/12/2019 1103   PROT 7.7 10/13/2017 1106   PROT 8.8 (H) 04/11/2017 1023   ALBUMIN 3.7 02/12/2019 1103   ALBUMIN 3.3 10/13/2017 1106   ALBUMIN 3.7 04/11/2017 1023   AST 12 (L) 02/12/2019 1103   AST 16 04/11/2017 1023   ALT 4 02/12/2019 1103   ALT 15 10/13/2017 1106   ALT 8 04/11/2017 1023   ALKPHOS 70 02/12/2019 1103   ALKPHOS 68 10/13/2017 1106   ALKPHOS 80 04/11/2017 1023   BILITOT 0.6 02/12/2019 1103   BILITOT 0.54 04/11/2017 1023   GFRNONAA 12 (L) 02/12/2019 1103   GFRAA 14 (L) 02/12/2019 1103   Lab Results  Component Value Date   CHOL 124 10/03/2018   HDL 23 (L) 10/03/2018   LDLCALC 73 10/03/2018   TRIG 138 10/03/2018   CHOLHDL 5.4 10/03/2018   No results found for: HGBA1C Lab Results  Component Value Date   VITAMINB12 301 10/03/2018   Lab Results  Component Value Date   TSH 0.831 10/03/2018     10/03/18 MRI HEAD [I reviewed images myself and agree with interpretation. -VRP]  1. No acute intracranial process. 2. Mild chronic small vessel ischemic changes. Old RIGHT basal ganglia lacunar infarct.  10/03/18 MRA HEAD [I reviewed images myself and agree with interpretation. -VRP]  1. No emergent large vessel occlusion. 2. Severe LEFT and moderate RIGHT ICA stenosis.    ASSESSMENT AND PLAN  63 y.o. year old female here with new onset events with unresponsiveness, convulsions, incontinence, suspicious for seizures.  First event may have been syncope versus seizure.  Second event more likely epileptic seizure.  Dx:  1. Seizures ([place])   2. 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 levETIRAcetam (KEPPRA) 500 MG tablet    Sig: Take 1 tablet (500 mg total) by mouth daily AND 1 tablet (500 mg total) every Monday, Wednesday, and Friday at 6 PM.    Dispense:  50 tablet    Refill:  12   Return in about 1 year (around 06/17/2020) for with NP (Amy Lomax).     Penni Bombard, MD [id number], [id number] AM Certified in Neurology, Neurophysiology and Neuroimaging  [facility] [address] [place], [place] [address] [phone]

## 2019-07-04 NOTE — Unmapped (Signed)
Confirmed with patient to get flu vaccine.  Confirmed she has already received the pneumonia vaccines. Asked what she can do to prevent hospitalization in the future like in 2019.  Let her know we are minimizing her transplant immunosuppression as much as we can.  Encouraged her to continue getting monthly labs and to follow up with PCP for routine health maintenance.

## 2019-07-22 DIAGNOSIS — Z79899 Other long term (current) drug therapy: Secondary | ICD-10-CM

## 2019-07-22 DIAGNOSIS — Z944 Liver transplant status: Secondary | ICD-10-CM

## 2019-07-22 DIAGNOSIS — K769 Liver disease, unspecified: Secondary | ICD-10-CM

## 2019-07-30 NOTE — Unmapped (Signed)
Called patient as per Katherine Norton request to remind her to get labs due to the recent change in drug manufacturer of her cyclosporine, unable to reach patient, unable to leave VM because of VM box full.

## 2019-08-09 NOTE — Unmapped (Signed)
Called patient to remind her to get labs due to recent change in cyclosporine manf. She verbalized understanding and stated she would go Monday or Tuesday.

## 2019-08-15 ENCOUNTER — Ambulatory Visit: Payer: Medicaid Other | Admitting: Family

## 2019-08-15 ENCOUNTER — Other Ambulatory Visit: Payer: Medicaid Other

## 2019-08-19 DIAGNOSIS — K769 Liver disease, unspecified: Principal | ICD-10-CM

## 2019-08-19 DIAGNOSIS — Z79899 Other long term (current) drug therapy: Principal | ICD-10-CM

## 2019-08-19 DIAGNOSIS — Z944 Liver transplant status: Principal | ICD-10-CM

## 2019-08-20 ENCOUNTER — Encounter: Payer: Self-pay | Admitting: Family

## 2019-08-20 ENCOUNTER — Inpatient Hospital Stay: Payer: Medicaid Other | Attending: Family

## 2019-08-20 ENCOUNTER — Other Ambulatory Visit: Payer: Self-pay

## 2019-08-20 ENCOUNTER — Inpatient Hospital Stay (HOSPITAL_BASED_OUTPATIENT_CLINIC_OR_DEPARTMENT_OTHER): Payer: Medicaid Other | Admitting: Family

## 2019-08-20 ENCOUNTER — Telehealth: Payer: Self-pay | Admitting: *Deleted

## 2019-08-20 VITALS — BP 133/77 | HR 81 | Temp 96.9°F | Resp 17 | Wt 123.4 lb

## 2019-08-20 DIAGNOSIS — R5383 Other fatigue: Secondary | ICD-10-CM | POA: Diagnosis not present

## 2019-08-20 DIAGNOSIS — K754 Autoimmune hepatitis: Secondary | ICD-10-CM | POA: Diagnosis not present

## 2019-08-20 DIAGNOSIS — N189 Chronic kidney disease, unspecified: Secondary | ICD-10-CM | POA: Insufficient documentation

## 2019-08-20 DIAGNOSIS — D631 Anemia in chronic kidney disease: Secondary | ICD-10-CM

## 2019-08-20 DIAGNOSIS — Z992 Dependence on renal dialysis: Secondary | ICD-10-CM | POA: Diagnosis not present

## 2019-08-20 DIAGNOSIS — D61818 Other pancytopenia: Secondary | ICD-10-CM | POA: Insufficient documentation

## 2019-08-20 DIAGNOSIS — Z944 Liver transplant status: Secondary | ICD-10-CM | POA: Diagnosis not present

## 2019-08-20 LAB — CBC WITH DIFFERENTIAL (CANCER CENTER ONLY)
Abs Immature Granulocytes: 0.02 10*3/uL (ref 0.00–0.07)
Basophils Absolute: 0 10*3/uL (ref 0.0–0.1)
Basophils Relative: 1 %
Eosinophils Absolute: 0.1 10*3/uL (ref 0.0–0.5)
Eosinophils Relative: 3 %
HCT: 36.6 % (ref 36.0–46.0)
Hemoglobin: 11.3 g/dL — ABNORMAL LOW (ref 12.0–15.0)
Immature Granulocytes: 1 %
Lymphocytes Relative: 24 %
Lymphs Abs: 1 10*3/uL (ref 0.7–4.0)
MCH: 30.4 pg (ref 26.0–34.0)
MCHC: 30.9 g/dL (ref 30.0–36.0)
MCV: 98.4 fL (ref 80.0–100.0)
Monocytes Absolute: 0.5 10*3/uL (ref 0.1–1.0)
Monocytes Relative: 12 %
Neutro Abs: 2.5 10*3/uL (ref 1.7–7.7)
Neutrophils Relative %: 59 %
Platelet Count: 216 10*3/uL (ref 150–400)
RBC: 3.72 MIL/uL — ABNORMAL LOW (ref 3.87–5.11)
RDW: 14.2 % (ref 11.5–15.5)
WBC Count: 4.3 10*3/uL (ref 4.0–10.5)
nRBC: 0 % (ref 0.0–0.2)

## 2019-08-20 LAB — CMP (CANCER CENTER ONLY)
ALT: 10 U/L (ref 0–44)
AST: 14 U/L — ABNORMAL LOW (ref 15–41)
Albumin: 4 g/dL (ref 3.5–5.0)
Alkaline Phosphatase: 72 U/L (ref 38–126)
Anion gap: 8 (ref 5–15)
BUN: 21 mg/dL (ref 8–23)
CO2: 32 mmol/L (ref 22–32)
Calcium: 9.5 mg/dL (ref 8.9–10.3)
Chloride: 98 mmol/L (ref 98–111)
Creatinine: 3.56 mg/dL (ref 0.44–1.00)
GFR, Est AFR Am: 15 mL/min — ABNORMAL LOW (ref >60)
GFR, Estimated: 13 mL/min — ABNORMAL LOW (ref >60)
Glucose, Bld: 101 mg/dL — ABNORMAL HIGH (ref 70–99)
Potassium: 4.7 mmol/L (ref 3.5–5.1)
Sodium: 138 mmol/L (ref 135–145)
Total Bilirubin: 0.3 mg/dL (ref 0.3–1.2)
Total Protein: 8.2 g/dL — ABNORMAL HIGH (ref 6.5–8.1)

## 2019-08-20 NOTE — Telephone Encounter (Signed)
Jory Ee NP notified of creatinine-3.56.  No new orders received at this time.

## 2019-08-20 NOTE — Unmapped (Signed)
Called patient to remind her to get labs and to follow up with kidney transplant clinic for evaluation [phone]).  No answer and no VM set up.  Sent letter to patient.

## 2019-08-20 NOTE — Progress Notes (Signed)
Hematology and Oncology Follow Up Visit  Tami [name] [id number] 1956-06-09 63 y.o. 08/20/2019   Principle Diagnosis:  Chronic pancytopenia  Status post liver transplant for autoimmune hepatitis Anemia of chronic kidney disease  Current Therapy:   Aranesp weekly given weekly with dialysis    Interim History:  Tami [name] is here today for follow-up. She is doing quite well and has had no new issues since we last saw her.  She is tolerating dialysis (M,W,F) nicely but does have fatigue after each treatment. She states that her nephrologist has discussed pursuing a kidney transplant for her but she is still considering this as an option.  She is receiving Aranesp weekly with dialysis and Hgb is stable at 11.3.  She has had no issues with bleeding. No bruising or petechiae.  No swelling, tenderness, numbness or tingling in her extremities.  She stays cold "all the time".  No fever, n/v, cough, rash, dizziness, SOB, chest pain, palpitations, abdominal pain or changes in bowel or bladder habits.  No falls, syncope or seizures to report.  She has maintained a good appetite and is hydrating appropriately on fluid restriction. Her weight is stable.   ECOG Performance Status: 1 - Symptomatic but completely ambulatory  Medications:  Allergies as of 08/20/2019      Reactions   Iodinated Diagnostic Agents    UNSPECIFIED REACTION    Ioxaglate    UNSPECIFIED REACTION    Latex    UNSPECIFIED REACTION       Medication List       Accurate as of August 20, 2019  3:21 PM. If you have any questions, ask your nurse or doctor.        albuterol 108 (90 Base) MCG/ACT inhaler Commonly known as: VENTOLIN HFA Inhale 2 puffs into the lungs every 6 (six) hours as needed.   allopurinol 100 MG tablet Commonly known as: ZYLOPRIM Take 100 mg by mouth daily.   amLODipine 5 MG tablet Commonly known as: NORVASC Take 5 mg by mouth daily.   amoxicillin-clavulanate 500-125 MG tablet Commonly  known as: AUGMENTIN Take 1 tablet by mouth daily.   Auryxia 1 GM 210 MG(Fe) tablet Generic drug: ferric citrate TAKE 2 TABLETS BY MOUTH THREE TIMES A DAY WITH MEALS.   SWALLOW WHOLE, DO NOT CHEW OR CRUSH MEDICATION   AND 1 TABLET WITH A SNACK   azithromycin 250 MG tablet Commonly known as: ZITHROMAX TAKE 2 TABLETS BY MOUTH TODAY, THEN TAKE 1 TABLET DAILY FOR 4 DAYS   Ensure Take 1 Can by mouth daily.   folic acid 1 MG tablet Commonly known as: FOLVITE Take 1 mg by mouth daily.   gabapentin 300 MG capsule Commonly known as: NEURONTIN Take 300 mg by mouth 2 (two) times daily.   gabapentin 100 MG capsule Commonly known as: NEURONTIN TAKE 2 CAPSULE BY MOUTH TWICE A DAY AS NEEDED TAKE FOR PAIN   labetalol 100 MG tablet Commonly known as: NORMODYNE Take 100 mg by mouth 2 (two) times daily.   levETIRAcetam 500 MG tablet Commonly known as: KEPPRA Take 1 tablet (500 mg total) by mouth daily AND 1 tablet (500 mg total) every Monday, Wednesday, and Friday at 6 PM.   lidocaine-prilocaine cream Commonly known as: EMLA APPLY SMALL AMOUNT TO ACCESS SITE (AVF) 30 - 60 MINUTES BEFORE DIALYSIS. COVER WITH OCCLUSIVE DRESSING ( SARAN WRAP)   loratadine 10 MG tablet Commonly known as: CLARITIN Take 10 mg by mouth daily. X 10 days   metoprolol succinate  50 MG 24 hr tablet Commonly known as: TOPROL-XL 1 (ONE) TABLET TABLET ER 24HR DAILY   Neoral 25 MG capsule Generic drug: cycloSPORINE modified Take 50 mg by mouth 2 (two) times daily.   ondansetron 4 MG tablet Commonly known as: ZOFRAN Take 4 mg by mouth every 6 (six) hours as needed for nausea or vomiting.   Oxycodone HCl 10 MG Tabs Take 1 tablet (10 mg total) by mouth every 6 (six) hours as needed for up to 10 doses (severe pain). - hold for sedation What changed: when to take this   promethazine 12.5 MG tablet Commonly known as: PHENERGAN Take 12.5 mg by mouth daily.   Renagel 800 MG tablet Generic drug: sevelamer TAKE 2  TABLETS BY MOUTH THREE TIMES DAILY WITH MEALS   Vitamin D (Ergocalciferol) 1.25 MG (50000 UT) Caps capsule Commonly known as: DRISDOL Take 50,000 Units by mouth once a week. Sunday       Allergies:  Allergies  Allergen Reactions  . Iodinated Diagnostic Agents     UNSPECIFIED REACTION   . Ioxaglate     UNSPECIFIED REACTION   . Latex     UNSPECIFIED REACTION     Past Medical History, Surgical history, Social history, and Family History were reviewed and updated.  Review of Systems: All other 10 point review of systems is negative.   Physical Exam:  weight is 123 lb 6.4 oz (56 kg). Her temporal temperature is 96.9 F (36.1 C) (abnormal). Her blood pressure is 133/77 and her pulse is 81. Her respiration is 17 and oxygen saturation is 100%.   Wt Readings from Last 3 Encounters:  08/20/19 123 lb 6.4 oz (56 kg)  06/18/19 124 lb 6.4 oz (56.4 kg)  02/12/19 140 lb 2 oz (63.6 kg)    Ocular: Sclerae unicteric, pupils equal, round and reactive to light Ear-nose-throat: Oropharynx clear, dentition fair Lymphatic: No cervical or supraclavicular adenopathy Lungs no rales or rhonchi, good excursion bilaterally Heart regular rate and rhythm, no murmur appreciated Abd soft, nontender, positive bowel sounds, no liver or spleen tip palpated on exam, no fluid wave  MSK no focal spinal tenderness, no joint edema Neuro: non-focal, well-oriented, appropriate affect Breasts: Deferred   Lab Results  Component Value Date   WBC 4.3 08/20/2019   HGB 11.3 (L) 08/20/2019   HCT 36.6 08/20/2019   MCV 98.4 08/20/2019   PLT 216 08/20/2019   Lab Results  Component Value Date   FERRITIN 710 (H) 10/13/2017   IRON 90 10/13/2017   TIBC 174 (L) 10/13/2017   UIBC 84 (L) 10/13/2017   IRONPCTSAT 52 10/13/2017   Lab Results  Component Value Date   RETICCTPCT 1.2 09/29/2015   RBC 3.72 (L) 08/20/2019   RETICCTABS 38.0 09/29/2015   No results found for: KPAFRELGTCHN, LAMBDASER, KAPLAMBRATIO No  results found for: Kandis Cocking, IGMSERUM Lab Results  Component Value Date   TOTALPROTELP 9.4 (H) 11/19/2008   ALBUMINELP 41.1 (L) 11/19/2008   A1GS 4.0 11/19/2008   A2GS 7.1 11/19/2008   BETS 4.0 (L) 11/19/2008   BETA2SER 7.1 (H) 11/19/2008   GAMS 36.7 (H) 11/19/2008   MSPIKE NOT DET 11/19/2008   SPEI * 11/19/2008     Chemistry      Component Value Date/Time   NA 138 02/12/2019 1103   NA 137 08/10/2018   NA 140 10/13/2017 1106   NA 138 04/11/2017 1023   K 3.3 (L) 02/12/2019 1103   K 5.3 no visable hemolysis (H) 10/13/2017 1106  K 5.2 No visable hemolysis (H) 04/11/2017 1023   CL 94 (L) 02/12/2019 1103   CL 104 10/13/2017 1106   CO2 34 (H) 02/12/2019 1103   CO2 17 (L) 10/13/2017 1106   CO2 19 (L) 04/11/2017 1023   BUN 11 02/12/2019 1103   BUN 1 (A) 08/10/2018   BUN 58 (H) 10/13/2017 1106   BUN 33.4 (H) 04/11/2017 1023   CREATININE 3.69 (HH) 02/12/2019 1103   CREATININE 5.9 (HH) 10/13/2017 1106   CREATININE 1.8 (H) 04/11/2017 1023   GLU 92 08/10/2018      Component Value Date/Time   CALCIUM 8.3 (L) 02/12/2019 1103   CALCIUM 8.4 10/13/2017 1106   CALCIUM 9.0 04/11/2017 1023   ALKPHOS 70 02/12/2019 1103   ALKPHOS 68 10/13/2017 1106   ALKPHOS 80 04/11/2017 1023   AST 12 (L) 02/12/2019 1103   AST 16 04/11/2017 1023   ALT 4 02/12/2019 1103   ALT 15 10/13/2017 1106   ALT 8 04/11/2017 1023   BILITOT 0.6 02/12/2019 1103   BILITOT 0.54 04/11/2017 1023       Impression and Plan: Ms. Quintiliani is a very pleasant 63 yo African American female with anemia of chronic renal disease on hemodialysis.  She continues to do well and her Hgb is looking  Great at 11.3.  We will plan to see her back in another 8 months for follow-up.  She will contact our office with any questions or concerns. We can certainly see her sooner if needed.   Laverna Peace, NP 10/27/20203:21 PM

## 2019-08-21 LAB — CBC W/ DIFFERENTIAL
BANDED NEUTROPHILS ABSOLUTE COUNT: 0 10*3/uL (ref 0.0–0.1)
BASOPHILS ABSOLUTE COUNT: 0 10*3/uL (ref 0.0–0.2)
BASOPHILS RELATIVE PERCENT: 0 %
EOSINOPHILS ABSOLUTE COUNT: 0.1 10*3/uL (ref 0.0–0.4)
EOSINOPHILS RELATIVE PERCENT: 2 %
HEMATOCRIT: 36.4 % (ref 34.0–46.6)
HEMOGLOBIN: 11.6 g/dL (ref 11.1–15.9)
IMMATURE GRANULOCYTES: 0 %
LYMPHOCYTES ABSOLUTE COUNT: 0.9 10*3/uL (ref 0.7–3.1)
LYMPHOCYTES RELATIVE PERCENT: 25 %
MEAN CORPUSCULAR HEMOGLOBIN CONC: 31.9 g/dL (ref 31.5–35.7)
MEAN CORPUSCULAR HEMOGLOBIN: 29.7 pg (ref 26.6–33.0)
MEAN CORPUSCULAR VOLUME: 93 fL (ref 79–97)
MONOCYTES ABSOLUTE COUNT: 0.4 10*3/uL (ref 0.1–0.9)
MONOCYTES RELATIVE PERCENT: 11 %
PLATELET COUNT: 222 10*3/uL (ref 150–450)
RED BLOOD CELL COUNT: 3.91 x10E6/uL (ref 3.77–5.28)
RED CELL DISTRIBUTION WIDTH: 14.4 % (ref 11.7–15.4)
WHITE BLOOD CELL COUNT: 3.7 10*3/uL (ref 3.4–10.8)

## 2019-08-21 LAB — MAGNESIUM: Magnesium:MCnc:Pt:Ser/Plas:Qn:: 2.1

## 2019-08-21 LAB — COMPREHENSIVE METABOLIC PANEL
A/G RATIO: 0.9 — ABNORMAL LOW (ref 1.2–2.2)
ALBUMIN: 3.8 g/dL (ref 3.8–4.8)
ALKALINE PHOSPHATASE: 91 IU/L (ref 39–117)
ALT (SGPT): 10 IU/L (ref 0–32)
AST (SGOT): 14 IU/L (ref 0–40)
BILIRUBIN TOTAL: 0.3 mg/dL (ref 0.0–1.2)
BLOOD UREA NITROGEN: 21 mg/dL (ref 8–27)
CHLORIDE: 98 mmol/L (ref 96–106)
CO2: 27 mmol/L (ref 20–29)
CREATININE: 3.58 mg/dL — ABNORMAL HIGH (ref 0.57–1.00)
GLOBULIN, TOTAL: 4.1 g/dL (ref 1.5–4.5)
GLUCOSE: 109 mg/dL — ABNORMAL HIGH (ref 65–99)
POTASSIUM: 5.2 mmol/L (ref 3.5–5.2)
SODIUM: 138 mmol/L (ref 134–144)
TOTAL PROTEIN: 7.9 g/dL (ref 6.0–8.5)

## 2019-08-21 LAB — BILIRUBIN DIRECT: Bilirubin.glucuronidated+Bilirubin.albumin bound:MCnc:Pt:Ser/Plas:Qn:: 0.07

## 2019-08-21 LAB — PHOSPHORUS, SERUM: Phosphate:MCnc:Pt:Ser/Plas:Qn:: 5.4 — ABNORMAL HIGH

## 2019-08-21 LAB — EOSINOPHILS RELATIVE PERCENT: Eosinophils/100 leukocytes:NFr:Pt:Bld:Qn:Automated count: 2

## 2019-08-21 LAB — GAMMA GLUTAMYL TRANSFERASE: Gamma glutamyl transferase:CCnc:Pt:Ser/Plas:Qn:: 11

## 2019-08-21 LAB — GLUCOSE: Glucose:MCnc:Pt:Ser/Plas:Qn:: 109 — ABNORMAL HIGH

## 2019-08-22 LAB — CYCLOSPORINE, LC-MS/MS: Cyclosporine:MCnc:Pt:Bld:Qn:LC/MS/MS: 33 — ABNORMAL LOW

## 2019-08-23 NOTE — Unmapped (Signed)
Spoke with NP Martin-Velez about low cyclosporine level and no adjustments at this time to patient immunosuppression medication due to her current kidney status.

## 2019-09-02 NOTE — Unmapped (Signed)
[facility] created a consultation letter and sent it to the patient prior to our phone consultation on September 05, 2019.

## 2019-09-03 NOTE — Unmapped (Signed)
Received call from patient reporting her son died tragically in a car accident on Halloween night and as a result is too overwhelmed right now to go under kidney evaluation.  She would like to cancel her appointment scheduled for this week and then call when she is ready to reschedule.

## 2019-09-03 NOTE — Unmapped (Addendum)
Called patient to see if she has watched the orientation video for her virtual visits on 09/05/2019.  First attempt, sounded like someone answered the phone but no response.     Called again and patient stated I thought my coordinator was [place] talk to you..  When I clarified, she was talking about her liver coordinator, Lauris Poag.  Patient stated she spoke to [place] this morning and her son has passed away.  I gave the patient my condolences and asked her if she would like for the appointments to be re-scheduled and she stated yes.  I told her I would cancel the appointments for 09/05/2019 and she would be contacted to re-scheduled.    October 08, 2019 7:36 AM  Requested [phone] by leaving message with SW, De [place], at dialysis center.

## 2019-09-09 NOTE — Unmapped (Signed)
[facility] Clinical Assessment & Refill Coordination Note    Katherine Norton, Evan: 01/31/1956  Phone: [phone] (home)     All above HIPAA information was verified with patient.     Specialty Medication(s):   Transplant: cyclosporine 25mg      Current Outpatient Medications   Medication Sig Dispense Refill   ??? acetaminophen (TYLENOL) 325 MG tablet 2 tablets (650 mg total) by G-tube route every six (6) hours as needed.  0   ??? albuterol 2.5 mg/0.5 mL nebulizer solution Inhale 0.5 mL (2.5 mg total) by nebulization every six (6) hours as needed for wheezing or shortness of breath. 30 each 12   ??? amLODIPine (NORVASC) 5 MG tablet Take 5 mg by mouth daily.     ??? calcitonin, salmon, (MIACALCIN) 200 unit/actuation nasal spray 1 spray by Alternating Nares route once daily.      ??? chlorhexidine (PERIDEX) 0.12 % solution 5 mL by Mouth route two (2) times a day. 1 Bottle 0   ??? cycloSPORINE modified (NEORAL) 25 MG capsule Take 2 capsules (50 mg total) by mouth Two (2) times a day. 360 capsule 3   ??? dextrose (D10W) 10% Soln bolus Infuse 125 mL into a venous catheter every thirty (30) minutes as needed.  0   ??? dextrose 5 % and sodium chloride 0.9 % (D5-NS) infu Infuse 25 mL/hr into a venous catheter continuous as needed (if tube feed held or NPO).  0   ??? famotidine (PEPCID) 20 MG tablet 1 tablet (20 mg total) by G-tube route daily. (Patient taking differently: Take 20 mg by mouth daily. ) 30 tablet 0   ??? gentamicin 1 mg/mL, sodium citrate 4% Gent locks with dialysis 2.4 mL 4   ??? heparin sodium,porcine (HEPARIN, PORCINE,) 1,000 unit/mL 1000 unit/mL injection This heparin is for dialysis use ONLY. RN if you are unable to aspirate from both lumes, call provider piror to using the catheter. 1 mL 0   ??? heparin sodium,porcine (HEPARIN, PORCINE,) 5,000 unit/mL injection Inject 1 mL (5,000 Units total) under the skin every eight (8) hours. 1 mL 0 ??? insulin lispro (HUMALOG) 100 unit/mL injection Inject 0-0.12 mL (0-12 Units total) under the skin Every six (6) hours. (Patient taking differently: Inject 0-12 Units under the skin Three (3) times a day before meals. ) 1440 Units 0   ??? melatonin 3 mg Tab Take 1 tablet (3 mg total) by mouth nightly.  0   ??? oxyCODONE (ROXICODONE) 5 MG immediate release tablet 1 tablet (5 mg total) by G-tube route every six (6) hours as needed. for up to 5 days (Patient taking differently: Take 5 mg by mouth every six (6) hours as needed. ) 10 tablet 0     No current facility-administered medications for this visit.         Changes to medications: Mikalah reports no changes at this time.    Allergies   Allergen Reactions   ??? Iodinated Contrast Media    ??? Ioxaglate Sodium    ??? Latex        Changes to allergies: No    SPECIALTY MEDICATION ADHERENCE     Cyclosporine 25 mg: 13 days of medicine on hand     Medication Adherence    Patient reported X missed doses in the last month: 0  Specialty Medication: Cyclosporine 25mg   Patient is on additional specialty medications: No          Specialty medication(s) dose(s) confirmed: Regimen is  correct and unchanged.     Are there any concerns with adherence? No    Adherence counseling provided? Not needed    CLINICAL MANAGEMENT AND INTERVENTION      Clinical Benefit Assessment:    Do you feel the medicine is effective or helping your condition? Yes    Clinical Benefit counseling provided? Not needed    Adverse Effects Assessment:    Are you experiencing any side effects? No    Are you experiencing difficulty administering your medicine? No    Quality of Life Assessment:    How many days over the past month did your liver transplant  keep you from your normal activities? For example, brushing your teeth or getting up in the morning. 0    Have you discussed this with your provider? Not needed    Therapy Appropriateness:    Is therapy appropriate? Yes, therapy is appropriate and should be continued DISEASE/MEDICATION-SPECIFIC INFORMATION      N/A    PATIENT SPECIFIC NEEDS     ? Does the patient have any physical, cognitive, or cultural barriers? No    ? Is the patient high risk? No     ? Does the patient require a Care Management Plan? No     ? Does the patient require physician intervention or other additional services (i.e. nutrition, smoking cessation, social work)? No      SHIPPING     Specialty Medication(s) to be Shipped:   Transplant: cyclosporine 25mg     Other medication(s) to be shipped: none     Changes to insurance: No    Delivery Scheduled: Yes, Expected medication delivery date: 09/16/2019.     Medication will be delivered via UPS to the confirmed prescription address in [facility].    The patient will receive a drug information handout for each medication shipped and additional FDA Medication Guides as required.  Verified that patient has previously received a Conservation officer, historic buildings.    All of the patient's questions and concerns have been addressed.    Tera Helper   [facility] Specialty Pharmacist

## 2019-09-13 MED FILL — CYCLOSPORINE MODIFIED 25 MG CAPSULE: ORAL | 90 days supply | Qty: 360 | Fill #1

## 2019-09-13 MED FILL — CYCLOSPORINE MODIFIED 25 MG CAPSULE: 90 days supply | Qty: 360 | Fill #1 | Status: AC

## 2019-09-16 DIAGNOSIS — Z944 Liver transplant status: Principal | ICD-10-CM

## 2019-09-16 DIAGNOSIS — Z79899 Other long term (current) drug therapy: Principal | ICD-10-CM

## 2019-09-16 DIAGNOSIS — K769 Liver disease, unspecified: Principal | ICD-10-CM

## 2019-09-18 ENCOUNTER — Telehealth: Payer: Self-pay | Admitting: *Deleted

## 2019-09-18 NOTE — Telephone Encounter (Signed)
Message received from patient to inform Dr. Marin Olp that her son passed away on Sep 11, 2019.  Dr. Marin Olp notified. Call placed back to patient and emotional support given.

## 2019-10-14 DIAGNOSIS — K769 Liver disease, unspecified: Principal | ICD-10-CM

## 2019-10-14 DIAGNOSIS — Z944 Liver transplant status: Principal | ICD-10-CM

## 2019-10-14 DIAGNOSIS — Z79899 Other long term (current) drug therapy: Principal | ICD-10-CM

## 2019-10-30 IMAGING — CT CT HEAD W/O CM
3 series · 16 of 47 positions shown, 19 images · non-contrast
Comparison: None.

CLINICAL DATA: Seizure

EXAM:
CT HEAD WITHOUT CONTRAST
TECHNIQUE: Contiguous axial images were obtained from the base of the skull
through the vertex without intravenous contrast.

[Series 2: head wo · axial · 0.39mm/px · z∈[-204,-79]mm · 10 of 31 slices shown, 13 images]
[im 3/31  brain]
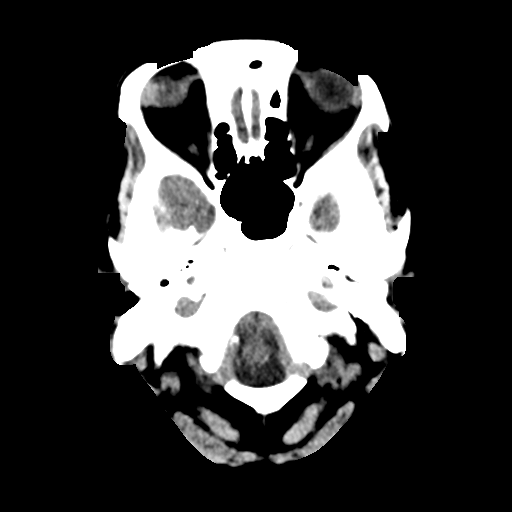
[im 3/31  bone]
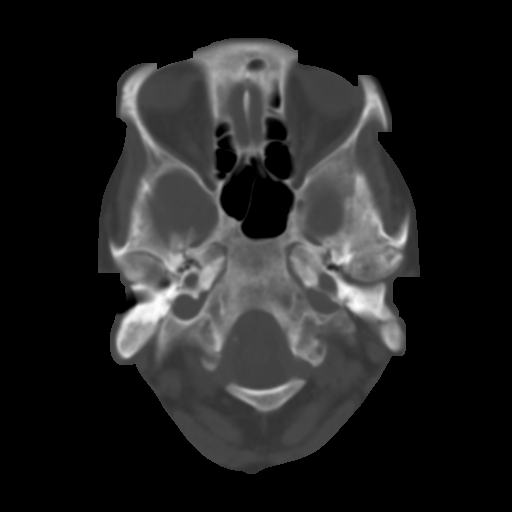
[im 6/31  brain]
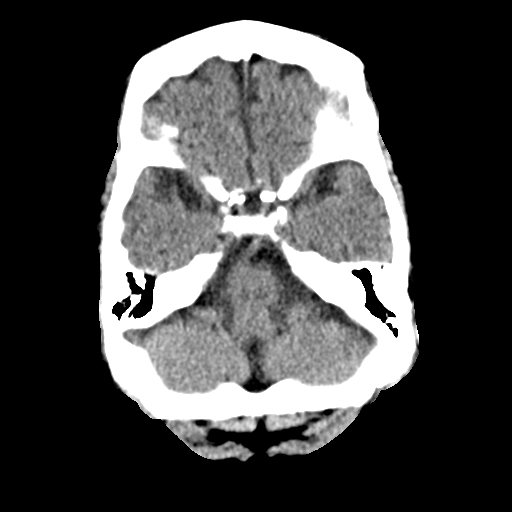
[im 9/31  brain]
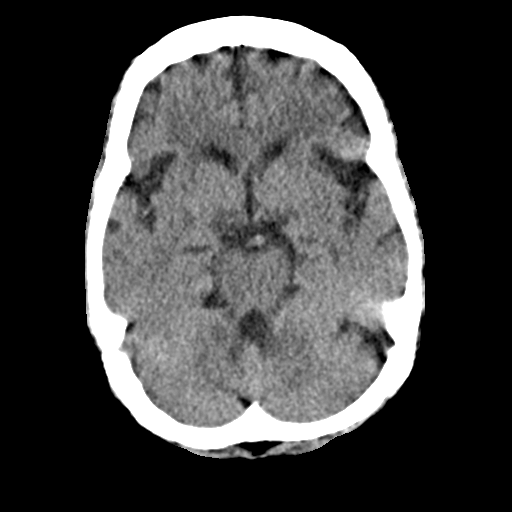
[im 11/31  brain]
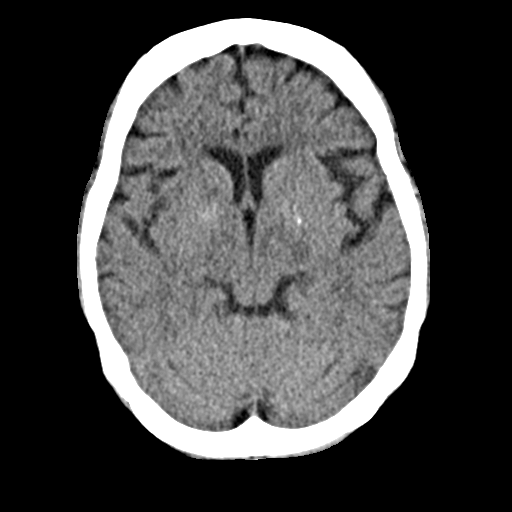
[im 14/31  brain]
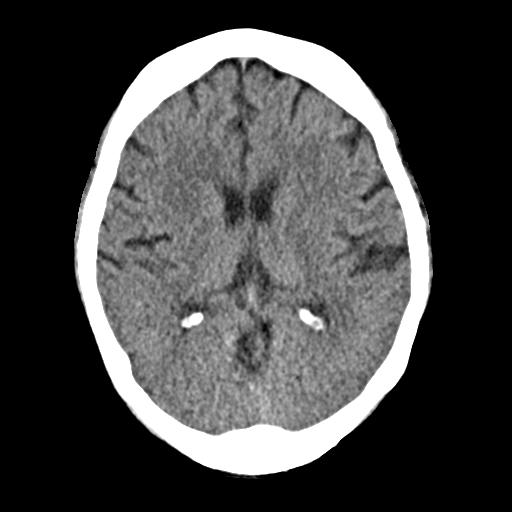
[im 14/31  bone]
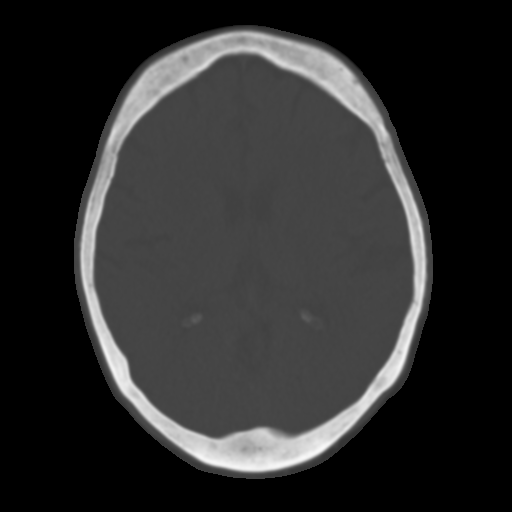
[im 17/31  brain]
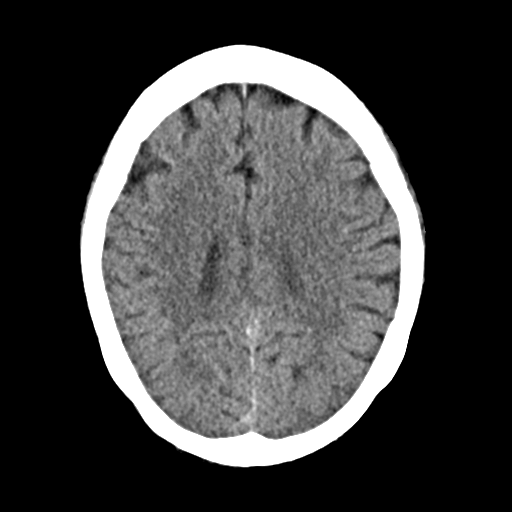
[im 20/31  brain]
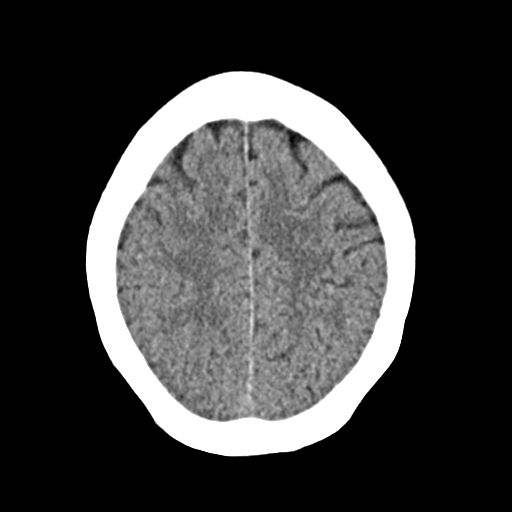
[im 23/31  brain]
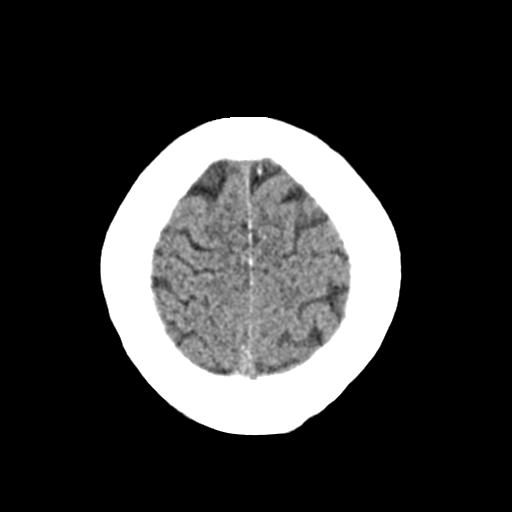
[im 25/31  brain]
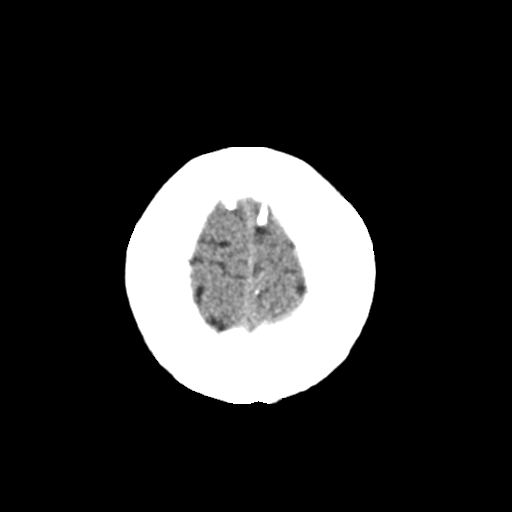
[im 25/31  bone]
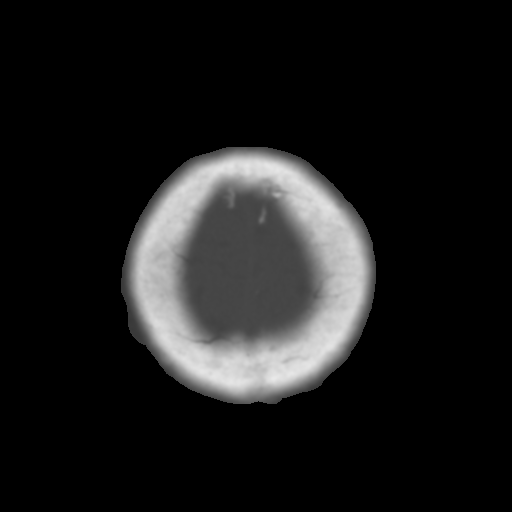
[im 28/31  brain]
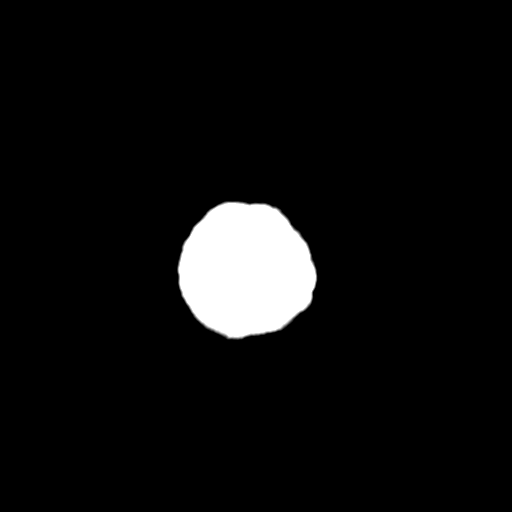

[Series 4: coronal soft · coronal · 0.32mm/px · 3 of 63 slices shown]
[im 21/63  brain]
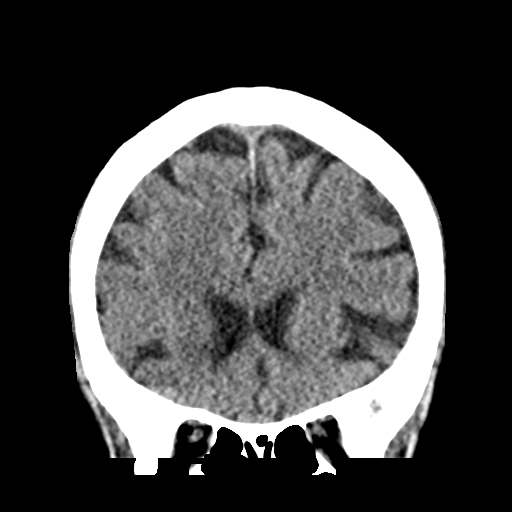
[im 28/63  brain]
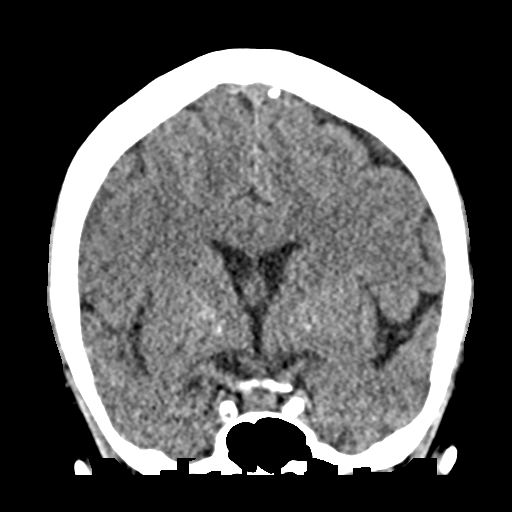
[im 35/63  brain]
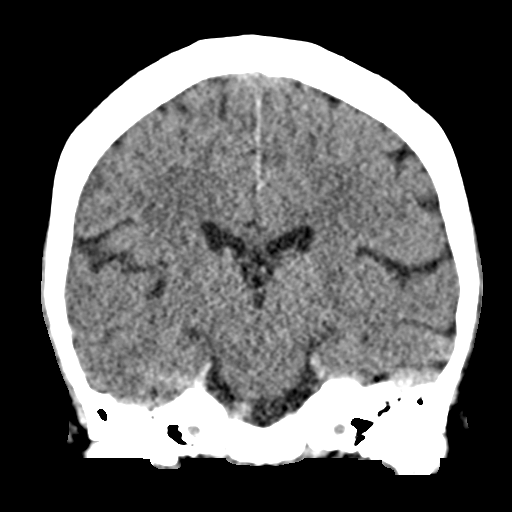

[Series 5: sag soft · sagittal · 0.31mm/px · 3 of 50 slices shown]
[im 17/50  brain]
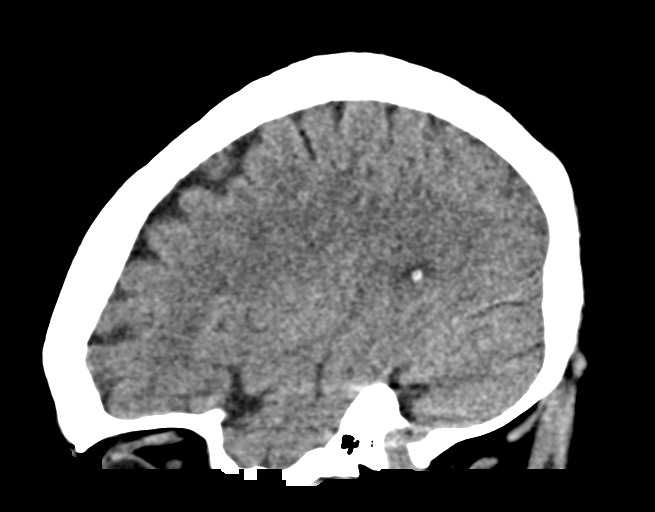
[im 25/50  brain]
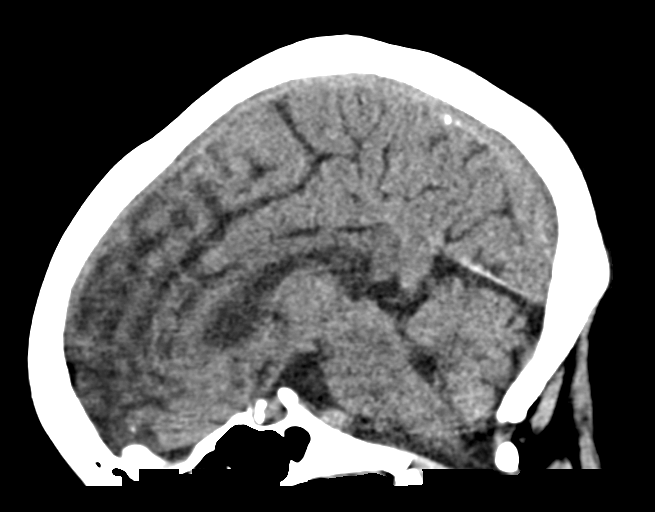
[im 33/50  brain]
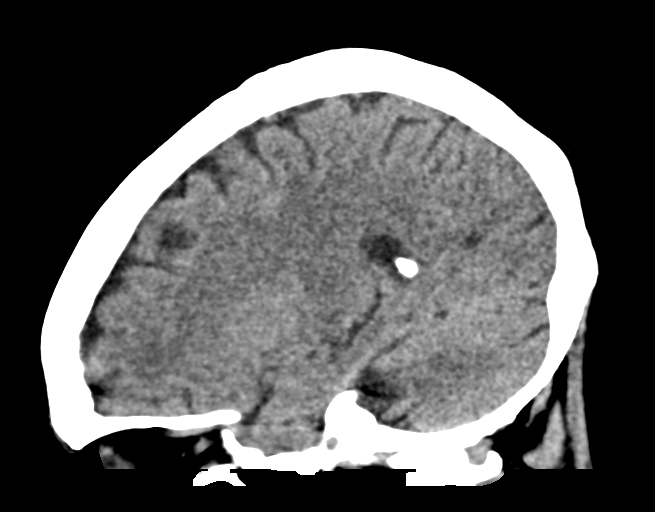

[16 of 47 positions shown; findings below may reference images not displayed]

FINDINGS: Brain: There is no mass, hemorrhage or extra-axial collection. The
size and configuration of the ventricles and extra-axial CSF spaces
are normal. There is hypoattenuation of the periventricular white
matter, most commonly indicating chronic ischemic microangiopathy.

Vascular: No abnormal hyperdensity of the major intracranial
arteries or dural venous sinuses. No intracranial atherosclerosis.

Skull: The visualized skull base, calvarium and extracranial soft
tissues are normal.

Sinuses/Orbits: No fluid levels or advanced mucosal thickening of
the visualized paranasal sinuses. No mastoid or middle ear effusion.
The orbits are normal.
IMPRESSION: Mild chronic small vessel disease without acute intracranial
abnormality.

## 2019-10-30 IMAGING — MR MR HEAD W/O CM
11 of 13 series · 30 of 48 positions shown · non-contrast
Comparison: CT HEAD October 03, 2018

CLINICAL DATA: Seizure activity, 2 minutes episode of cardiac
arrest. No history of seizures. History of autoimmune hepatitis,
status post liver transplant.

EXAM:
MRI HEAD WITHOUT CONTRAST
MRA HEAD WITHOUT CONTRAST
TECHNIQUE: Multiplanar, multiecho pulse sequences of the brain and surrounding
structures were obtained without intravenous contrast. Angiographic
images of the head were obtained using MRA technique without
contrast.

[Series 3: DWI · axial · 3.0mm · 0.94mm/px · z∈[-41,+101]mm · 6 of 100 slices shown (1 of 2)]
[im 1/100]
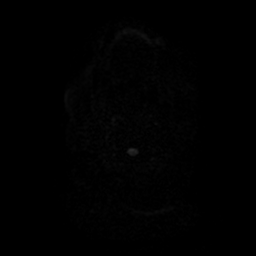
[im 20/100]
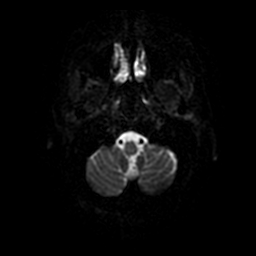
[im 40/100]
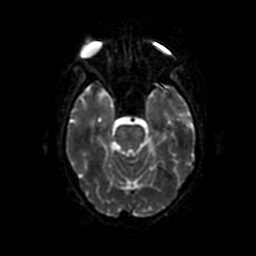
[im 60/100]
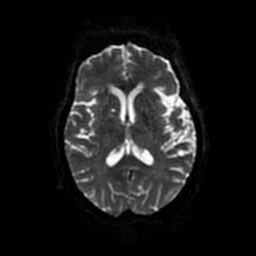
[im 80/100]
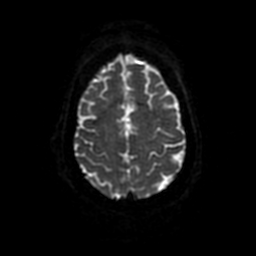
[im 100/100]
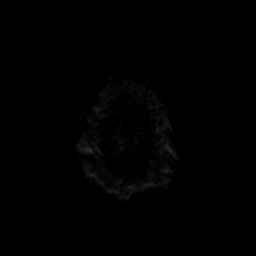

[Series 4: ax (id) 2 · axial · 1.0mm · 0.43mm/px · z∈[-28,-4]mm · 3 of 184 slices shown]
[im 1/184]
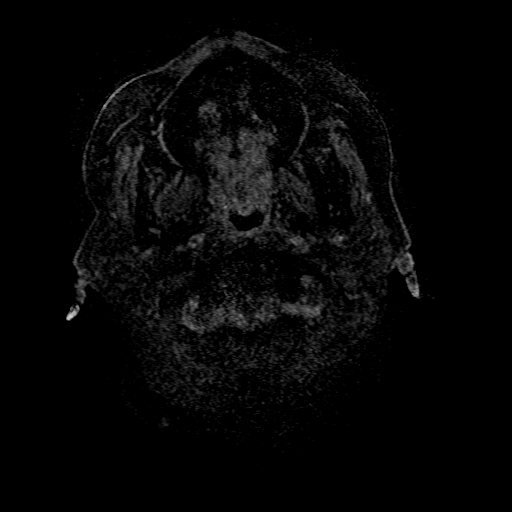
[im 34/184]
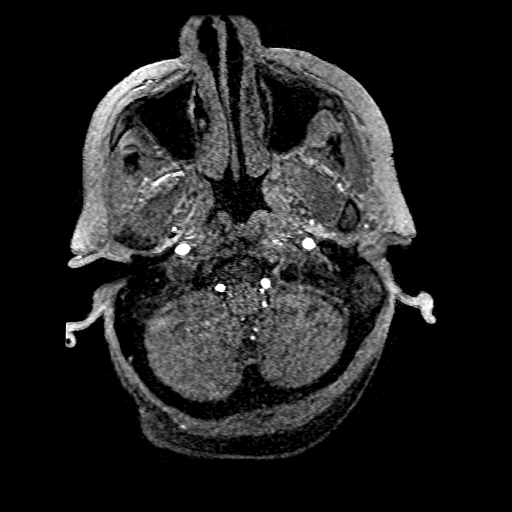
[im 50/184]
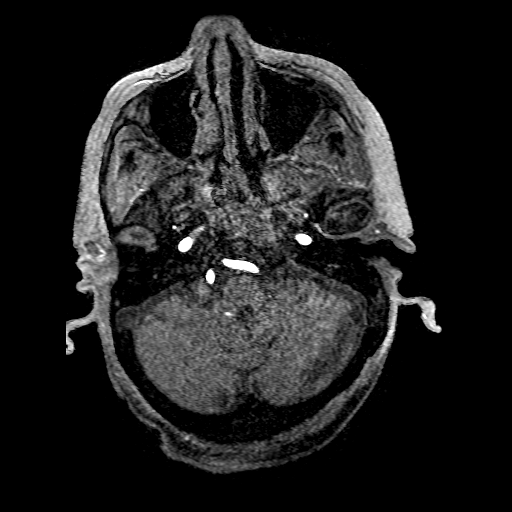

[Series 5: DWI · coronal · 4.0mm · 0.94mm/px · 5 of 71 slices shown (2 of 2)]
[im 1/71]
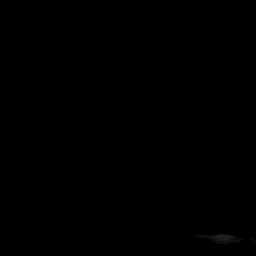
[im 18/71]
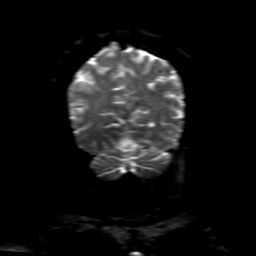
[im 36/71]
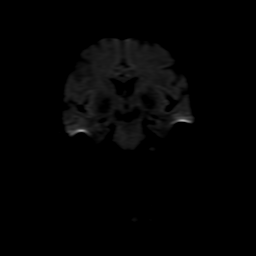
[im 53/71]
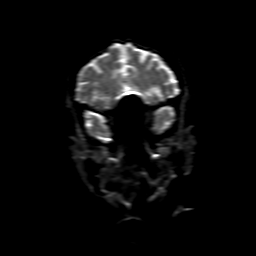
[im 71/71]
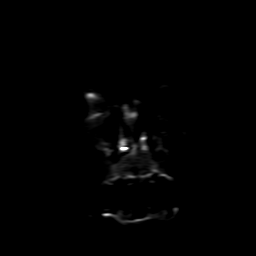

[Series 6: FLAIR · sagittal · 5.0mm · 0.47mm/px · 1 of 23 slices shown (1 of 3)]
[im 1/23]
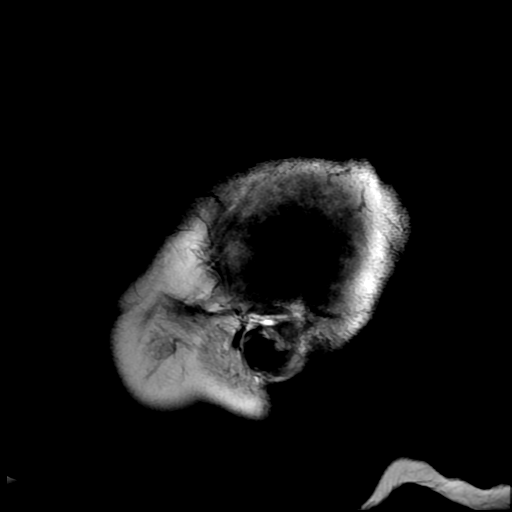

[Series 8: T2 · axial · 5.0mm · 0.47mm/px · z∈[-40,+99]mm · 2 of 25 slices shown (1 of 2)]
[im 1/25]
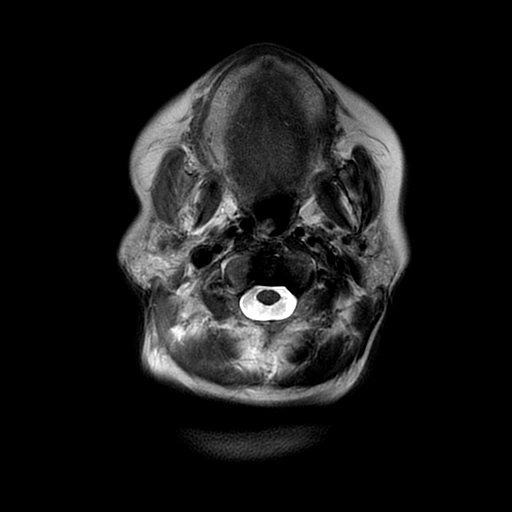
[im 25/25]
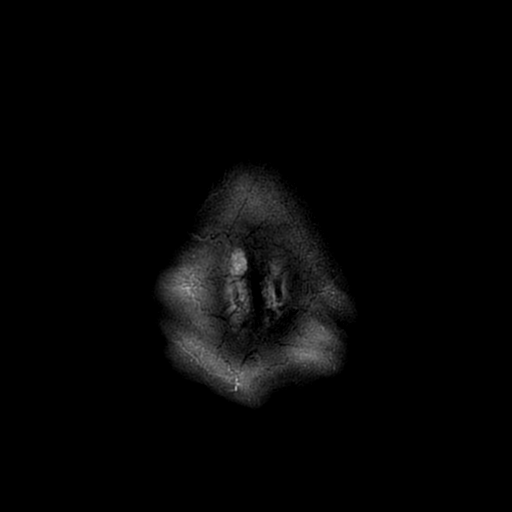

[Series 9: FLAIR · axial · 5.0mm · 0.47mm/px · z∈[-40,+99]mm · 2 of 25 slices shown (2 of 3)]
[im 1/25]
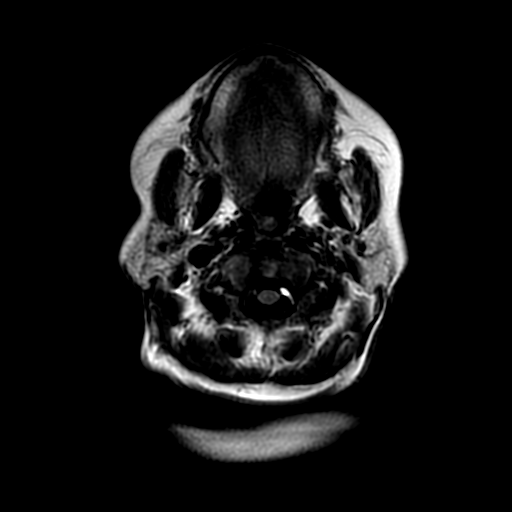
[im 25/25]
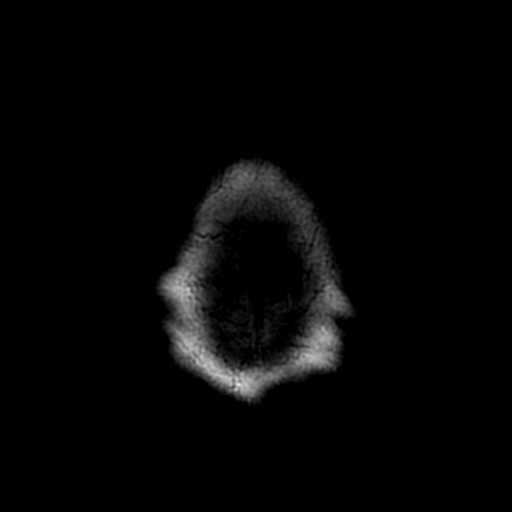

[Series 12: T2 · coronal · 5.0mm · 0.94mm/px · 2 of 30 slices shown (2 of 2)]
[im 1/30]
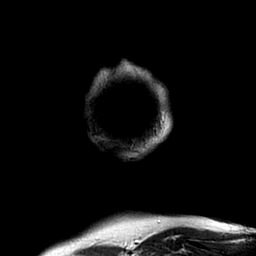
[im 30/30]
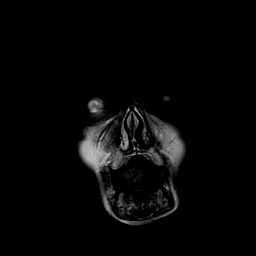

[Series 13: T2 fat-sat · coronal · 3.0mm · 0.43mm/px · 2 of 27 slices shown]
[im 1/27]
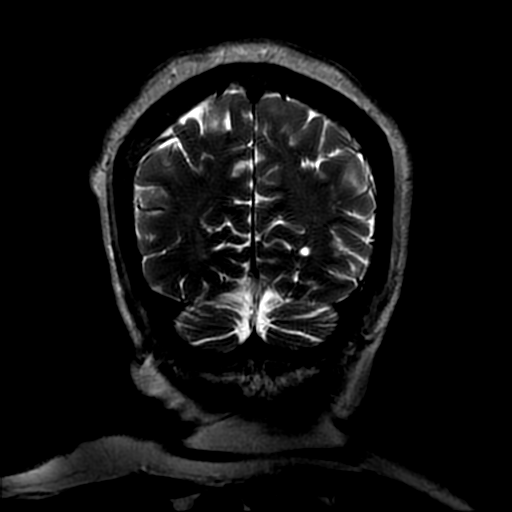
[im 27/27]
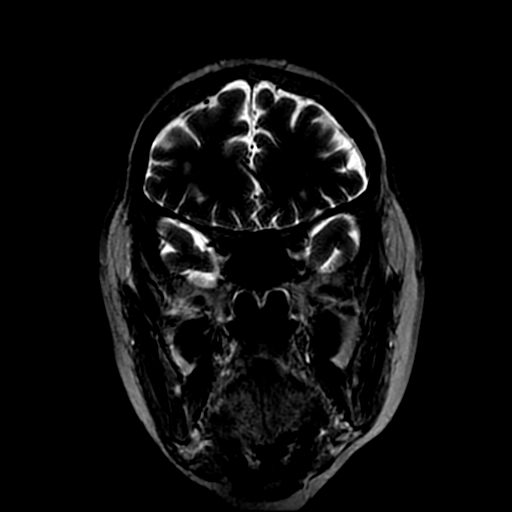

[Series 14: FLAIR · coronal · 3.0mm · 0.43mm/px · 2 of 27 slices shown (3 of 3)]
[im 1/27]
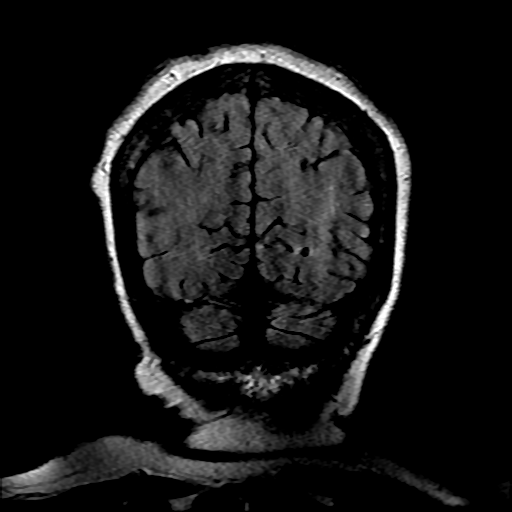
[im 27/27]
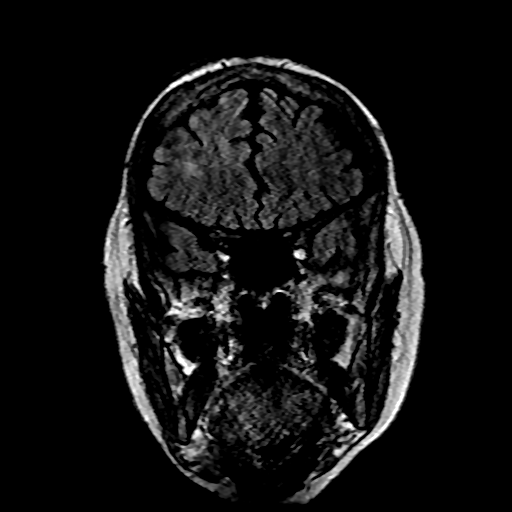

[Series 350: ADC · axial · 3.0mm · 0.94mm/px · z∈[-41,+101]mm · 3 of 50 slices shown (1 of 2)]
[im 1/50]
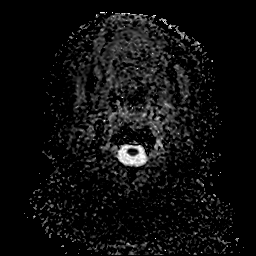
[im 25/50]
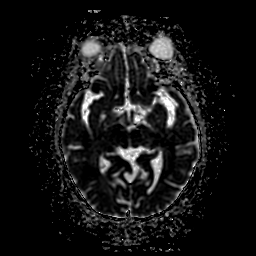
[im 50/50]
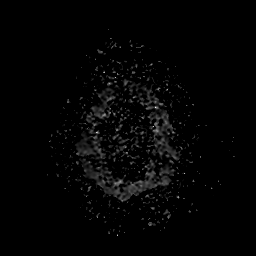

[Series 550: ADC · coronal · 4.0mm · 0.94mm/px · 2 of 36 slices shown (2 of 2)]
[im 1/36]
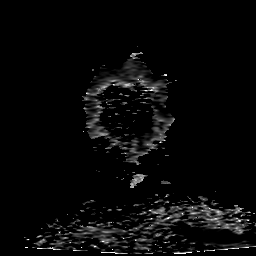
[im 36/36]
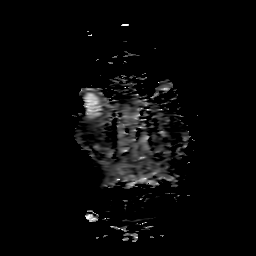

[30 of 48 positions shown; findings below may reference images not displayed]

FINDINGS: MRI HEAD FINDINGS

INTRACRANIAL CONTENTS: No reduced diffusion to suggest acute
ischemia. No susceptibility artifact to suggest hemorrhage. The
ventricles and sulci are normal for patient's age. Scattered
subcentimeter supratentorial white matter FLAIR T2 hyperintensities.
Hazy T2 hyperintense signal within the pons. Old RIGHT basal ganglia
lacunar infarcts. Mildly prominent basal ganglia perivascular spaces
associated with chronic small vessel ischemic changes. No suspicious
parenchymal signal, masses, mass effect. No abnormal extra-axial
fluid collections. No extra-axial masses. Normal symmetric
hippocampal size, morphology and signal.

VASCULAR: Normal major intracranial vascular flow voids present at
skull base.

SKULL AND UPPER CERVICAL SPINE: No abnormal sellar expansion. No
suspicious calvarial bone marrow signal. Severe LEFT
temporomandibular osteoarthrosis. Craniocervical junction
maintained.

SINUSES/ORBITS: The mastoid air-cells and included paranasal sinuses
are well-aerated.The included ocular globes and orbital contents are
non-suspicious. Status post bilateral ocular lens implants.

OTHER: Patient is edentulous.

MRA HEAD FINDINGS

ANTERIOR CIRCULATION: Flow related enhancement of the included
cervical, petrous, cavernous and supraclinoid internal carotid
arteries. Luminal irregularity bilateral internal carotid arteries
corresponding to calcific atherosclerosis on today's CT; moderate
RIGHT and severe LEFT paraclinoid stenosis. Patent anterior
communicating artery. Patent anterior and middle cerebral arteries.

No large vessel occlusion, aneurysm.

POSTERIOR CIRCULATION: Codominant vertebral arteries.
Vertebrobasilar arteries are patent, with normal flow related
enhancement of the main branch vessels. Patent posterior cerebral
arteries. Small LEFT posterior communicating artery present.

No large vessel occlusion, flow limiting stenosis,  aneurysm.

ANATOMIC VARIANTS: None.

Source images and MIP images were reviewed.
IMPRESSION: MRI HEAD:

1. No acute intracranial process.
2. Mild chronic small vessel ischemic changes. Old RIGHT basal
ganglia lacunar infarct.

MRA HEAD:

1. No emergent large vessel occlusion.
2. Severe LEFT and moderate RIGHT ICA stenosis.

## 2019-11-05 NOTE — Unmapped (Signed)
Confirmed with Coordinator Zink that the appointment phone number for patient to reschedule her kidney evaluation is [phone].  Provided this to patient and she verbalized understanding.

## 2019-11-11 DIAGNOSIS — K769 Liver disease, unspecified: Principal | ICD-10-CM

## 2019-11-11 DIAGNOSIS — Z79899 Other long term (current) drug therapy: Principal | ICD-10-CM

## 2019-11-11 DIAGNOSIS — Z944 Liver transplant status: Principal | ICD-10-CM

## 2019-12-09 DIAGNOSIS — Z944 Liver transplant status: Principal | ICD-10-CM

## 2019-12-09 DIAGNOSIS — K769 Liver disease, unspecified: Principal | ICD-10-CM

## 2019-12-09 DIAGNOSIS — Z79899 Other long term (current) drug therapy: Principal | ICD-10-CM

## 2019-12-11 LAB — BILIRUBIN DIRECT: Bilirubin.glucuronidated+Bilirubin.albumin bound:MCnc:Pt:Ser/Plas:Qn:: 0.14

## 2019-12-11 LAB — CBC W/ DIFFERENTIAL
BANDED NEUTROPHILS ABSOLUTE COUNT: 0 10*3/uL (ref 0.0–0.1)
BASOPHILS ABSOLUTE COUNT: 0 10*3/uL (ref 0.0–0.2)
BASOPHILS RELATIVE PERCENT: 0 %
EOSINOPHILS ABSOLUTE COUNT: 0.1 10*3/uL (ref 0.0–0.4)
EOSINOPHILS RELATIVE PERCENT: 2 %
HEMATOCRIT: 33.3 % — ABNORMAL LOW (ref 34.0–46.6)
HEMOGLOBIN: 11.3 g/dL (ref 11.1–15.9)
IMMATURE GRANULOCYTES: 0 %
MEAN CORPUSCULAR HEMOGLOBIN CONC: 33.9 g/dL (ref 31.5–35.7)
MEAN CORPUSCULAR HEMOGLOBIN: 32.7 pg (ref 26.6–33.0)
MEAN CORPUSCULAR VOLUME: 96 fL (ref 79–97)
MONOCYTES ABSOLUTE COUNT: 0.4 10*3/uL (ref 0.1–0.9)
MONOCYTES RELATIVE PERCENT: 12 %
NEUTROPHILS ABSOLUTE COUNT: 1.6 10*3/uL (ref 1.4–7.0)
NEUTROPHILS RELATIVE PERCENT: 51 %
PLATELET COUNT: 192 10*3/uL (ref 150–450)
RED BLOOD CELL COUNT: 3.46 x10E6/uL — ABNORMAL LOW (ref 3.77–5.28)
RED CELL DISTRIBUTION WIDTH: 13 % (ref 11.7–15.4)
WHITE BLOOD CELL COUNT: 3.1 10*3/uL — ABNORMAL LOW (ref 3.4–10.8)

## 2019-12-11 LAB — BASOPHILS ABSOLUTE COUNT: Basophils:NCnc:Pt:Bld:Qn:Automated count: 0

## 2019-12-11 LAB — COMPREHENSIVE METABOLIC PANEL
A/G RATIO: 1.1 — ABNORMAL LOW (ref 1.2–2.2)
ALKALINE PHOSPHATASE: 80 IU/L (ref 39–117)
BILIRUBIN TOTAL: 0.3 mg/dL (ref 0.0–1.2)
BLOOD UREA NITROGEN: 15 mg/dL (ref 8–27)
BUN / CREAT RATIO: 4 — ABNORMAL LOW (ref 12–28)
CALCIUM: 8.9 mg/dL (ref 8.7–10.3)
CHLORIDE: 97 mmol/L (ref 96–106)
CO2: 30 mmol/L — ABNORMAL HIGH (ref 20–29)
CREATININE: 3.73 mg/dL — ABNORMAL HIGH (ref 0.57–1.00)
GLOBULIN, TOTAL: 3.6 g/dL (ref 1.5–4.5)
GLUCOSE: 96 mg/dL (ref 65–99)
POTASSIUM: 3.6 mmol/L (ref 3.5–5.2)
SODIUM: 141 mmol/L (ref 134–144)
TOTAL PROTEIN: 7.5 g/dL (ref 6.0–8.5)

## 2019-12-11 LAB — GAMMA GLUTAMYL TRANSFERASE: Gamma glutamyl transferase:CCnc:Pt:Ser/Plas:Qn:: 12

## 2019-12-11 LAB — MAGNESIUM: Magnesium:MCnc:Pt:Ser/Plas:Qn:: 2.2

## 2019-12-11 LAB — GLOBULIN, TOTAL: Globulin:MCnc:Pt:Ser:Qn:Calculated: 3.6

## 2019-12-11 LAB — PHOSPHORUS, SERUM: Phosphate:MCnc:Pt:Ser/Plas:Qn:: 4.5 — ABNORMAL HIGH

## 2019-12-12 LAB — CYCLOSPORINE, LC-MS/MS: Cyclosporine:MCnc:Pt:Bld:Qn:LC/MS/MS: 27 — ABNORMAL LOW

## 2019-12-13 NOTE — Unmapped (Signed)
[facility] Refill Coordination Note  Medication: Neoral    Unable to reach patient to schedule shipment for medication being filled at [facility]. Mailbox is full so unable to leave message.  As this is the 3rd unsuccessful attempt to reach the patient, no additional phone call attempts will be made at this time.      Phone numbers attempted: [phone]  Last scheduled delivery: 09/13/19 (90 Day)    Please call the [facility] at [phone] (option [phone]) should you have any further questions.      Thanks,  [facility] Specialty Team

## 2019-12-14 NOTE — Unmapped (Signed)
[facility] Refill Coordination Note    Specialty Medication(s) to be Shipped:   Transplant: cyclosporine 25mg     Other medication(s) to be shipped: N/A     Katherine Norton, DOB: 10-15-1956  Phone: There are no phone numbers on file.      All above HIPAA information was verified with patient.     Was a Nurse, learning disability used for this call? No    Completed refill call assessment today to schedule patient's medication shipment from the [facility] [phone]).       Specialty medication(s) and dose(s) confirmed: Regimen is correct and unchanged.   Changes to medications: Katherine Norton reports no changes at this time.  Changes to insurance: No  Questions for the pharmacist: No    Confirmed patient received Welcome Packet with first shipment. The patient will receive a drug information handout for each medication shipped and additional FDA Medication Guides as required.       DISEASE/MEDICATION-SPECIFIC INFORMATION        N/A    SPECIALTY MEDICATION ADHERENCE     Medication Adherence    Patient reported X missed doses in the last month: 0  Specialty Medication: Cyclosporine 25mg   Patient is on additional specialty medications: No          Cyclosporine 25 mg: 14 days of medicine on hand     SHIPPING     Shipping address confirmed in Epic.     Delivery Scheduled: Yes, Expected medication delivery date: 12/19/2019.     Medication will be delivered via UPS to the prescription address in Epic [facility].    [name] [facility] Specialty Technician

## 2019-12-18 MED FILL — CYCLOSPORINE MODIFIED 25 MG CAPSULE: 90 days supply | Qty: 360 | Fill #2 | Status: AC

## 2019-12-18 MED FILL — CYCLOSPORINE MODIFIED 25 MG CAPSULE: ORAL | 90 days supply | Qty: 360 | Fill #2

## 2019-12-19 NOTE — Unmapped (Signed)
Patient provided new number to contact her by.  Confirmed with patient she is not due for an appointment until July 2021.  Also confirmed labs were good from this month.  She agreed to try to get reliable cyclosporine trough next lab draw.  Confirmed COVID-19 vaccination is safe and she can get the [employer] or [employer].

## 2019-12-28 NOTE — Unmapped (Signed)
Patient called and stated she has watched the video.  She needs to be scheduled for class.  I did confirm she watched the video and has no questions.  I will send pt education checklist to patient via mail (through Epic letter attachment) and she will fax back via dialysis unit.  Patient was transferred to scheduling line.

## 2019-12-30 NOTE — Unmapped (Signed)
I spoke with the patient about the needed appointments for kidney transplant evaluation.

## 2020-01-06 DIAGNOSIS — K769 Liver disease, unspecified: Principal | ICD-10-CM

## 2020-01-06 DIAGNOSIS — Z79899 Other long term (current) drug therapy: Principal | ICD-10-CM

## 2020-01-06 DIAGNOSIS — Z944 Liver transplant status: Principal | ICD-10-CM

## 2020-01-13 ENCOUNTER — Telehealth: Payer: Self-pay | Admitting: Neurology

## 2020-01-13 NOTE — Telephone Encounter (Signed)
Recommend she check in with PCP first. Then may also setup sooner appt in clinic with [name]. -VRP

## 2020-01-13 NOTE — Telephone Encounter (Signed)
Pt called back in regards to missed call please FU  

## 2020-01-13 NOTE — Telephone Encounter (Signed)
Called # provided, no answer, voice MB full.

## 2020-01-13 NOTE — Telephone Encounter (Signed)
Rep with [facility] called to report pt advised them she had two falls and jumping spells she believed to be mini seizures. states pt provided a new number [phone]. States pt would like to schedule a sooner apt as these are new symptoms she is experiencing.

## 2020-01-13 NOTE — Telephone Encounter (Signed)
Spoke with patient and informed her Dr Leta Baptist recommends she FU with PCP to be checked for possible causes of falls. She stated that sometimes at night her legs jerk. Last night she was on her way to the bathroom and her "legs gave out".  I advised she may need labs drawn to be checked for infections, electrolytes at PCP.  We moved her FU sooner with NP, first available around her dialysis schedule. I put her on wait list and advised again she contact PCP. Patient verbalized understanding, appreciation.

## 2020-01-13 NOTE — Telephone Encounter (Signed)
Attempted to reach patient, no answer and voice MB full.

## 2020-01-13 NOTE — Telephone Encounter (Signed)
Patient called after hours service yesterday, reporting that she had fallen.  I talked to the patient.  She was advised that if she were injured she should call 911 or go to the emergency room.  She lives with her sister-in-law.  She could not recall who she sees in this practice and for what reason.  She did not have any sluggishness in responding or dysarthria.  She stated that she sees her neurologist every 6 months and that she was last seen in August.  I told her, then she would have to have been seen in February or have an appointment pending soon.  She was not aware of any appointment.  She reported having fallen twice after dialysis, but did not want to go to the emergency room and reported that she was fine.  She wanted to come into the office today.  I advised her that I would pass her message along to her neurologist to review.  I did advise her that if she has any acute symptoms that she would have to be checked out in an urgent care or emergency room setting, and that I could not guarantee that she could be seen today.  She voiced understanding and agreement.  Next appointment according to her chart is in August of this year.  Dr. Leta Baptist, please review and advise.

## 2020-01-22 DIAGNOSIS — Z944 Liver transplant status: Principal | ICD-10-CM

## 2020-01-22 DIAGNOSIS — Z79899 Other long term (current) drug therapy: Principal | ICD-10-CM

## 2020-02-03 DIAGNOSIS — Z944 Liver transplant status: Principal | ICD-10-CM

## 2020-02-03 DIAGNOSIS — Z79899 Other long term (current) drug therapy: Principal | ICD-10-CM

## 2020-02-03 DIAGNOSIS — K769 Liver disease, unspecified: Principal | ICD-10-CM

## 2020-02-07 LAB — COMPREHENSIVE METABOLIC PANEL
A/G RATIO: 1 — ABNORMAL LOW (ref 1.2–2.2)
ALBUMIN: 3.8 g/dL (ref 3.8–4.8)
ALKALINE PHOSPHATASE: 98 IU/L (ref 39–117)
AST (SGOT): 15 IU/L (ref 0–40)
BILIRUBIN TOTAL: 0.3 mg/dL (ref 0.0–1.2)
BLOOD UREA NITROGEN: 7 mg/dL — ABNORMAL LOW (ref 8–27)
BUN / CREAT RATIO: 2 — ABNORMAL LOW (ref 12–28)
CALCIUM: 9.1 mg/dL (ref 8.7–10.3)
CHLORIDE: 92 mmol/L — ABNORMAL LOW (ref 96–106)
CO2: 26 mmol/L (ref 20–29)
CREATININE: 3.32 mg/dL — ABNORMAL HIGH (ref 0.57–1.00)
GLOBULIN, TOTAL: 4 g/dL (ref 1.5–4.5)
POTASSIUM: 3.6 mmol/L (ref 3.5–5.2)
SODIUM: 139 mmol/L (ref 134–144)
TOTAL PROTEIN: 7.8 g/dL (ref 6.0–8.5)

## 2020-02-07 LAB — CBC W/ DIFFERENTIAL
BANDED NEUTROPHILS ABSOLUTE COUNT: 0 10*3/uL (ref 0.0–0.1)
BASOPHILS ABSOLUTE COUNT: 0 10*3/uL (ref 0.0–0.2)
BASOPHILS RELATIVE PERCENT: 1 %
EOSINOPHILS ABSOLUTE COUNT: 0 10*3/uL (ref 0.0–0.4)
EOSINOPHILS RELATIVE PERCENT: 1 %
HEMATOCRIT: 30.6 % — ABNORMAL LOW (ref 34.0–46.6)
HEMOGLOBIN: 10.4 g/dL — ABNORMAL LOW (ref 11.1–15.9)
IMMATURE GRANULOCYTES: 0 %
LYMPHOCYTES ABSOLUTE COUNT: 1.1 10*3/uL (ref 0.7–3.1)
LYMPHOCYTES RELATIVE PERCENT: 29 %
MEAN CORPUSCULAR HEMOGLOBIN CONC: 34 g/dL (ref 31.5–35.7)
MEAN CORPUSCULAR VOLUME: 99 fL — ABNORMAL HIGH (ref 79–97)
MONOCYTES ABSOLUTE COUNT: 0.4 10*3/uL (ref 0.1–0.9)
MONOCYTES RELATIVE PERCENT: 10 %
NEUTROPHILS RELATIVE PERCENT: 59 %
PLATELET COUNT: 234 10*3/uL (ref 150–450)
RED BLOOD CELL COUNT: 3.09 x10E6/uL — ABNORMAL LOW (ref 3.77–5.28)
RED CELL DISTRIBUTION WIDTH: 14 % (ref 11.7–15.4)
WHITE BLOOD CELL COUNT: 3.6 10*3/uL (ref 3.4–10.8)

## 2020-02-07 LAB — NEUTROPHILS ABSOLUTE COUNT: Neutrophils:NCnc:Pt:Bld:Qn:Automated count: 2.2

## 2020-02-07 LAB — PHOSPHORUS, SERUM: Phosphate:MCnc:Pt:Ser/Plas:Qn:: 3.7

## 2020-02-07 LAB — PHOSPHORUS: PHOSPHORUS, SERUM: 3.7 mg/dL (ref 3.0–4.3)

## 2020-02-07 LAB — BILIRUBIN DIRECT: Bilirubin.glucuronidated+Bilirubin.albumin bound:MCnc:Pt:Ser/Plas:Qn:: 0.13

## 2020-02-07 LAB — MAGNESIUM: Magnesium:MCnc:Pt:Ser/Plas:Qn:: 2.1

## 2020-02-07 LAB — TOTAL PROTEIN: Protein:MCnc:Pt:Ser/Plas:Qn:: 7.8

## 2020-02-07 LAB — GAMMA GLUTAMYL TRANSFERASE: Gamma glutamyl transferase:CCnc:Pt:Ser/Plas:Qn:: 12

## 2020-02-08 LAB — CYCLOSPORINE, LC-MS/MS: Cyclosporine:MCnc:Pt:Bld:Qn:LC/MS/MS: 32 — ABNORMAL LOW

## 2020-02-11 NOTE — Unmapped (Signed)
Per NP Martin-Velez no changes at this time to patient cyclosporine dose.

## 2020-02-14 NOTE — Unmapped (Signed)
Called patient to re-schedule eval appts. Patient scheduled for 03/19/20 and 03/26/20 with letter sent.

## 2020-02-20 ENCOUNTER — Telehealth: Payer: Self-pay | Admitting: Family

## 2020-02-20 ENCOUNTER — Encounter: Payer: Self-pay | Admitting: Family

## 2020-02-20 ENCOUNTER — Inpatient Hospital Stay: Payer: Medicaid Other

## 2020-02-20 ENCOUNTER — Inpatient Hospital Stay: Payer: Medicaid Other | Attending: Family | Admitting: Family

## 2020-02-20 ENCOUNTER — Other Ambulatory Visit: Payer: Self-pay

## 2020-02-20 ENCOUNTER — Telehealth: Payer: Self-pay | Admitting: *Deleted

## 2020-02-20 VITALS — BP 101/69 | HR 103 | Temp 97.1°F | Resp 20 | Ht 60.0 in | Wt 109.0 lb

## 2020-02-20 DIAGNOSIS — K754 Autoimmune hepatitis: Secondary | ICD-10-CM | POA: Diagnosis not present

## 2020-02-20 DIAGNOSIS — N189 Chronic kidney disease, unspecified: Secondary | ICD-10-CM | POA: Diagnosis not present

## 2020-02-20 DIAGNOSIS — Z992 Dependence on renal dialysis: Secondary | ICD-10-CM | POA: Insufficient documentation

## 2020-02-20 DIAGNOSIS — D61818 Other pancytopenia: Secondary | ICD-10-CM | POA: Diagnosis not present

## 2020-02-20 DIAGNOSIS — D631 Anemia in chronic kidney disease: Secondary | ICD-10-CM

## 2020-02-20 DIAGNOSIS — Z944 Liver transplant status: Secondary | ICD-10-CM | POA: Diagnosis not present

## 2020-02-20 LAB — CBC WITH DIFFERENTIAL (CANCER CENTER ONLY)
Abs Immature Granulocytes: 0.02 10*3/uL (ref 0.00–0.07)
Basophils Absolute: 0 10*3/uL (ref 0.0–0.1)
Basophils Relative: 0 %
Eosinophils Absolute: 0.1 10*3/uL (ref 0.0–0.5)
Eosinophils Relative: 1 %
HCT: 32.8 % — ABNORMAL LOW (ref 36.0–46.0)
Hemoglobin: 10.5 g/dL — ABNORMAL LOW (ref 12.0–15.0)
Immature Granulocytes: 0 %
Lymphocytes Relative: 25 %
Lymphs Abs: 1.1 10*3/uL (ref 0.7–4.0)
MCH: 32.4 pg (ref 26.0–34.0)
MCHC: 32 g/dL (ref 30.0–36.0)
MCV: 101.2 fL — ABNORMAL HIGH (ref 80.0–100.0)
Monocytes Absolute: 0.5 10*3/uL (ref 0.1–1.0)
Monocytes Relative: 10 %
Neutro Abs: 2.8 10*3/uL (ref 1.7–7.7)
Neutrophils Relative %: 64 %
Platelet Count: 106 10*3/uL — ABNORMAL LOW (ref 150–400)
RBC: 3.24 MIL/uL — ABNORMAL LOW (ref 3.87–5.11)
RDW: 13.8 % (ref 11.5–15.5)
WBC Count: 4.5 10*3/uL (ref 4.0–10.5)
nRBC: 0 % (ref 0.0–0.2)

## 2020-02-20 LAB — CMP (CANCER CENTER ONLY)
ALT: 5 U/L (ref 0–44)
AST: 15 U/L (ref 15–41)
Albumin: 3.6 g/dL (ref 3.5–5.0)
Alkaline Phosphatase: 71 U/L (ref 38–126)
Anion gap: 13 (ref 5–15)
BUN: 13 mg/dL (ref 8–23)
CO2: 32 mmol/L (ref 22–32)
Calcium: 9.3 mg/dL (ref 8.9–10.3)
Chloride: 92 mmol/L — ABNORMAL LOW (ref 98–111)
Creatinine: 4 mg/dL (ref 0.44–1.00)
GFR, Est AFR Am: 13 mL/min — ABNORMAL LOW (ref >60)
GFR, Estimated: 11 mL/min — ABNORMAL LOW (ref >60)
Glucose, Bld: 116 mg/dL — ABNORMAL HIGH (ref 70–99)
Potassium: 2.8 mmol/L — CL (ref 3.5–5.1)
Sodium: 137 mmol/L (ref 135–145)
Total Bilirubin: 0.5 mg/dL (ref 0.3–1.2)
Total Protein: 7.9 g/dL (ref 6.5–8.1)

## 2020-02-20 NOTE — Progress Notes (Signed)
Hematology and Oncology Follow Up Visit  Tami [name] [id number] Jul 26, 1956 64 y.o. 02/20/2020   Principle Diagnosis:  Chronic pancytopenia  Status post liver transplant for autoimmune hepatitis Anemia of chronic kidney disease  Current Therapy:        Aranesp weekly given weekly with dialysis    Interim History:  [name] is here today for follow-up. She is still grieving the unexpected loss of her son. This has been terribly hard for both she and the rest of her family. They are leaning on each other and are also all in counseling.  She is tolerating dialysis well but does have some associated fatigue and dizziness.  She states that she has lost her appetite while grieving and is trying her best to eat. She is hydrating properly. Her weight is down 13 lbs since her last visit.  No fever, chills, n/v, cough, rash, SOB, chest pain, palpitations, abdominal pain or changes in bowel or bladder habits.  No swelling, tenderness, numbness or tingling in her extremities at this time.  She ambulates with a cane for added support.  No episodes of bleeding. No bruising or petechiae.   ECOG Performance Status: 1 - Symptomatic but completely ambulatory  Medications:  Allergies as of 02/20/2020      Reactions   Iodinated Diagnostic Agents Other (See Comments)   UNSPECIFIED REACTION    Ioxaglate    UNSPECIFIED REACTION    Latex    UNSPECIFIED REACTION    Quelicin  [succinylcholine Chloride] Other (See Comments)      Medication List       Accurate as of February 20, 2020  2:43 PM. If you have any questions, ask your nurse or doctor.        STOP taking these medications   amoxicillin-clavulanate 500-125 MG tablet Commonly known as: AUGMENTIN Stopped by: Laverna Peace, NP   azithromycin 250 MG tablet Commonly known as: ZITHROMAX Stopped by: Laverna Peace, NP     TAKE these medications   albuterol 108 (90 Base) MCG/ACT inhaler Commonly known as: VENTOLIN HFA Inhale 2  puffs into the lungs every 6 (six) hours as needed.   allopurinol 100 MG tablet Commonly known as: ZYLOPRIM Take 100 mg by mouth daily.   amLODipine 5 MG tablet Commonly known as: NORVASC Take 5 mg by mouth daily.   Auryxia 1 GM 210 MG(Fe) tablet Generic drug: ferric citrate TAKE 2 TABLETS BY MOUTH THREE TIMES A DAY WITH MEALS.   SWALLOW WHOLE, DO NOT CHEW OR CRUSH MEDICATION   AND 1 TABLET WITH A SNACK   Dialyvite 800-Zinc 15 0.8 MG Tabs Take by mouth.   Ensure Take 1 Can by mouth daily.   Flonase Allergy Relief 50 MCG/ACT nasal spray Generic drug: fluticasone Place into the nose.   folic acid 1 MG tablet Commonly known as: FOLVITE Take 1 mg by mouth daily.   gabapentin 300 MG capsule Commonly known as: NEURONTIN Take 300 mg by mouth 2 (two) times daily.   gabapentin 100 MG capsule Commonly known as: NEURONTIN TAKE 2 CAPSULE BY MOUTH TWICE A DAY AS NEEDED TAKE FOR PAIN   heparin sodium (porcine) 1000 UNIT/ML injection Heparin Sodium (Porcine) 1,000 Units/mL Systemic   labetalol 100 MG tablet Commonly known as: NORMODYNE Take 100 mg by mouth 2 (two) times daily.   levETIRAcetam 500 MG tablet Commonly known as: KEPPRA Take 1 tablet (500 mg total) by mouth daily AND 1 tablet (500 mg total) every Monday, Wednesday, and Friday at 6  PM.   lidocaine-prilocaine cream Commonly known as: EMLA emla  cream   lidocaine-prilocaine cream Commonly known as: EMLA APPLY SMALL AMOUNT TO ACCESS SITE (AVF) 30 - 60 MINUTES BEFORE DIALYSIS. COVER WITH OCCLUSIVE DRESSING ( SARAN WRAP)   loratadine 10 MG tablet Commonly known as: CLARITIN Take 10 mg by mouth daily. X 10 days   Mapap 325 MG tablet Generic drug: acetaminophen Take by mouth.   metoprolol succinate 50 MG 24 hr tablet Commonly known as: TOPROL-XL 1 (ONE) TABLET TABLET ER 24HR DAILY   Neoral 25 MG capsule Generic drug: cycloSPORINE modified Take 50 mg by mouth 2 (two) times daily.   ondansetron 4 MG tablet  Commonly known as: ZOFRAN Take 4 mg by mouth every 6 (six) hours as needed for nausea or vomiting.   Oxycodone HCl 10 MG Tabs Take 1 tablet (10 mg total) by mouth every 6 (six) hours as needed for up to 10 doses (severe pain). - hold for sedation What changed: when to take this   Pancrelipase (Lip-Prot-Amyl) 24000-76000 units Cpep Pancrelipase 24000-76000   pantoprazole 40 MG tablet Commonly known as: PROTONIX Take by mouth.   Paxil 10 MG tablet Generic drug: PARoxetine Take by mouth.   promethazine 12.5 MG tablet Commonly known as: PHENERGAN Take 12.5 mg by mouth daily.   Renagel 800 MG tablet Generic drug: sevelamer TAKE 2 TABLETS BY MOUTH THREE TIMES DAILY WITH MEALS   Vitamin D (Ergocalciferol) 1.25 MG (50000 UNIT) Caps capsule Commonly known as: DRISDOL Take 50,000 Units by mouth once a week. Sunday   Vitamin D 10 MCG/ML Liqd Take by mouth.       Allergies:  Allergies  Allergen Reactions  . Iodinated Diagnostic Agents Other (See Comments)    UNSPECIFIED REACTION   . Ioxaglate     UNSPECIFIED REACTION   . Latex     UNSPECIFIED REACTION   . Quelicin  [Succinylcholine Chloride] Other (See Comments)    Past Medical History, Surgical history, Social history, and Family History were reviewed and updated.  Review of Systems: All other 10 point review of systems is negative.   Physical Exam:  vitals were not taken for this visit.   Wt Readings from Last 3 Encounters:  08/20/19 123 lb 6.4 oz (56 kg)  06/18/19 124 lb 6.4 oz (56.4 kg)  02/12/19 140 lb 2 oz (63.6 kg)    Ocular: Sclerae unicteric, pupils equal, round and reactive to light Ear-nose-throat: Oropharynx clear, dentition fair Lymphatic: No cervical or supraclavicular adenopathy Lungs no rales or rhonchi, good excursion bilaterally Heart regular rate and rhythm, no murmur appreciated Abd soft, nontender, positive bowel sounds, no liver or spleen tip palpated on exam, no fluid wave  MSK no focal  spinal tenderness, no joint edema Neuro: non-focal, well-oriented, appropriate affect Breasts: Deferred   Lab Results  Component Value Date   WBC 4.5 02/20/2020   HGB 10.5 (L) 02/20/2020   HCT 32.8 (L) 02/20/2020   MCV 101.2 (H) 02/20/2020   PLT 106 (L) 02/20/2020   Lab Results  Component Value Date   FERRITIN 710 (H) 10/13/2017   IRON 90 10/13/2017   TIBC 174 (L) 10/13/2017   UIBC 84 (L) 10/13/2017   IRONPCTSAT 52 10/13/2017   Lab Results  Component Value Date   RETICCTPCT 1.2 09/29/2015   RBC 3.24 (L) 02/20/2020   RETICCTABS 38.0 09/29/2015   No results found for: KPAFRELGTCHN, LAMBDASER, KAPLAMBRATIO No results found for: Kandis Cocking, IGMSERUM Lab Results  Component Value Date  TOTALPROTELP 9.4 (H) 11/19/2008   ALBUMINELP 41.1 (L) 11/19/2008   A1GS 4.0 11/19/2008   A2GS 7.1 11/19/2008   BETS 4.0 (L) 11/19/2008   BETA2SER 7.1 (H) 11/19/2008   GAMS 36.7 (H) 11/19/2008   MSPIKE NOT DET 11/19/2008   SPEI * 11/19/2008     Chemistry      Component Value Date/Time   NA 138 08/20/2019 1500   NA 137 08/10/2018 0000   NA 140 10/13/2017 1106   NA 138 04/11/2017 1023   K 4.7 08/20/2019 1500   K 5.3 no visable hemolysis (H) 10/13/2017 1106   K 5.2 No visable hemolysis (H) 04/11/2017 1023   CL 98 08/20/2019 1500   CL 104 10/13/2017 1106   CO2 32 08/20/2019 1500   CO2 17 (L) 10/13/2017 1106   CO2 19 (L) 04/11/2017 1023   BUN 21 08/20/2019 1500   BUN 1 (A) 08/10/2018 0000   BUN 58 (H) 10/13/2017 1106   BUN 33.4 (H) 04/11/2017 1023   CREATININE 3.56 (HH) 08/20/2019 1500   CREATININE 5.9 (HH) 10/13/2017 1106   CREATININE 1.8 (H) 04/11/2017 1023   GLU 92 08/10/2018 0000      Component Value Date/Time   CALCIUM 9.5 08/20/2019 1500   CALCIUM 8.4 10/13/2017 1106   CALCIUM 9.0 04/11/2017 1023   ALKPHOS 72 08/20/2019 1500   ALKPHOS 68 10/13/2017 1106   ALKPHOS 80 04/11/2017 1023   AST 14 (L) 08/20/2019 1500   AST 16 04/11/2017 1023   ALT 10 08/20/2019 1500    ALT 15 10/13/2017 1106   ALT 8 04/11/2017 1023   BILITOT 0.3 08/20/2019 1500   BILITOT 0.54 04/11/2017 1023       Impression and Plan: Ms. Pro is a very pleasant 64yo African American female with anemia of chronic renal disease on hemodialysis. She receives Aranesp weekly with dialysis.  She continues to do well and her Hgb remains stable.  We will plan to see her back in another year for follow-up.  She will contact our office with any questions or concerns. We can certainly see her sooner if needed.   Laverna Peace, NP 4/29/20212:43 PM

## 2020-02-20 NOTE — Telephone Encounter (Signed)
Critical result reported by Richardson Landry DeWine in lab.  Potassium 2.8 and Creatinine 4.  Dr Marin Olp notified.

## 2020-02-20 NOTE — Telephone Encounter (Signed)
Appointments scheduled calendar printed &  Mailed per 4/29 lod

## 2020-02-26 NOTE — Progress Notes (Deleted)
PATIENT: Tami [name] DOB: 10/02/1956  REASON FOR VISIT: follow up HISTORY FROM: patient  No chief complaint on file.    HISTORY OF PRESENT ILLNESS: Today 02/26/20 Tami [name] is a 64 y.o. female here today for follow up.   HISTORY: (copied from Dr Gladstone Lighter note on 06/18/2019)  UPDATE (06/18/19, [facility]P): Since last visit, doing well. Symptoms are stable. No seizures. No alleviating or aggravating factors. Tolerating levetiracetam.    PRIOR HPI (12/17/18): 64 year old female here for evaluation of seizures.  History of liver transplant, hypertension, end-stage renal disease on hemodialysis.  10/03/2018 patient was at office visit, about to give blood when all of a sudden she became unresponsive and had some jerking movements.  Staff could not feel a pulse and started CPR.  Patient was immediately attended to by the rapid response team.  She was taken downstairs to the emergency room.  She may have had some type of gaze deviation initially resolved spontaneously.  While in the emergency room she had a second event with tonic-clonic seizures.  She was given IV Ativan and treated with Keppra.  She was admitted for further evaluation.  MRI, MRA of the head were obtained which showed no acute findings.  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  EEG was unremarkable.  Patient was discharged on no antiseizure medication.  Since that time patient is doing well.  No further events.   REVIEW OF SYSTEMS: Out of a complete 14 system review of symptoms, the patient complains only of the following symptoms, and all other reviewed systems are negative.  ALLERGIES: Allergies  Allergen Reactions  . Iodinated Diagnostic Agents Other (See Comments)    UNSPECIFIED REACTION   . Ioxaglate     UNSPECIFIED REACTION   . Latex     UNSPECIFIED REACTION   . Quelicin  [Succinylcholine Chloride] Other (See Comments)    HOME MEDICATIONS: Outpatient  Medications Prior to Visit  Medication Sig Dispense Refill  . acetaminophen (MAPAP) 325 MG tablet Take by mouth.    Marland Kitchen albuterol (PROVENTIL HFA;VENTOLIN HFA) 108 (90 Base) MCG/ACT inhaler Inhale 2 puffs into the lungs every 6 (six) hours as needed.     Marland Kitchen allopurinol (ZYLOPRIM) 100 MG tablet Take 100 mg by mouth daily.  5  . amLODipine (NORVASC) 5 MG tablet Take 5 mg by mouth daily.    Lorin Picket 1 GM 210 MG(Fe) tablet TAKE 2 TABLETS BY MOUTH THREE TIMES A DAY WITH MEALS.   SWALLOW WHOLE, DO NOT CHEW OR CRUSH MEDICATION   AND 1 TABLET WITH A SNACK    . B Complex-C-Zn-Folic Acid (DIALYVITE 284-XLKG 15) 0.8 MG TABS Take by mouth.    . Cholecalciferol (VITAMIN D) 10 MCG/ML LIQD Take by mouth.    . cycloSPORINE modified (NEORAL) 25 MG capsule Take 50 mg by mouth 2 (two) times daily.     Marland Kitchen ENSURE (ENSURE) Take 1 Can by mouth daily.     . fluticasone (FLONASE ALLERGY RELIEF) 50 MCG/ACT nasal spray Place into the nose.    . folic acid (FOLVITE) 1 MG tablet Take 1 mg by mouth daily.    Marland Kitchen gabapentin (NEURONTIN) 100 MG capsule TAKE 2 CAPSULE BY MOUTH TWICE A DAY AS NEEDED TAKE FOR PAIN    . gabapentin (NEURONTIN) 300 MG capsule Take 300 mg by mouth 2 (two) times daily.    . heparin 1000 UNIT/ML injection Heparin Sodium (Porcine) 1,000 Units/mL Systemic    .  labetalol (NORMODYNE) 100 MG tablet Take 100 mg by mouth 2 (two) times daily.    Marland Kitchen levETIRAcetam (KEPPRA) 500 MG tablet Take 1 tablet (500 mg total) by mouth daily AND 1 tablet (500 mg total) every Monday, Wednesday, and Friday at 6 PM. 50 tablet 12  . lidocaine-prilocaine (EMLA) cream APPLY SMALL AMOUNT TO ACCESS SITE (AVF) 30 - 60 MINUTES BEFORE DIALYSIS. COVER WITH OCCLUSIVE DRESSING ( SARAN WRAP)    . lidocaine-prilocaine (EMLA) cream emla  cream    . loratadine (CLARITIN) 10 MG tablet Take 10 mg by mouth daily. X 10 days    . metoprolol succinate (TOPROL-XL) 50 MG 24 hr tablet 1 (ONE) TABLET TABLET ER 24HR DAILY    . ondansetron (ZOFRAN) 4 MG tablet  Take 4 mg by mouth every 6 (six) hours as needed for nausea or vomiting.    . Oxycodone HCl 10 MG TABS Take 1 tablet (10 mg total) by mouth every 6 (six) hours as needed for up to 10 doses (severe pain). - hold for sedation (Patient taking differently: Take 10 mg by mouth every 4 (four) hours as needed (severe pain). - hold for sedation) 10 tablet 0  . Pancrelipase, Lip-Prot-Amyl, 24000-76000 units CPEP Pancrelipase 24000-76000    . pantoprazole (PROTONIX) 40 MG tablet Take by mouth.    Marland Kitchen PARoxetine (PAXIL) 10 MG tablet Take by mouth.    . promethazine (PHENERGAN) 12.5 MG tablet Take 12.5 mg by mouth daily.    Marland Kitchen RENAGEL 800 MG tablet TAKE 2 TABLETS BY MOUTH THREE TIMES DAILY WITH MEALS    . Vitamin D, Ergocalciferol, (DRISDOL) 1.25 MG (50000 UT) CAPS capsule Take 50,000 Units by mouth once a week. Sunday     No facility-administered medications prior to visit.    PAST MEDICAL HISTORY: Past Medical History:  Diagnosis Date  . Anemia   . Arthritis   . ESRD (end stage renal disease) on dialysis ([facility]CC)    "MWF; [place]" (10/04/2018)  . Gout   . Hepatitis, autoimmune (HCC) 12/08/2011  . Hypertension   . Seizures (HCC) 10/03/2018    PAST SURGICAL HISTORY: Past Surgical History:  Procedure Laterality Date  . ABDOMINAL HYSTERECTOMY    . AV FISTULA PLACEMENT Left 10/02/2018   Procedure: Creation of left arm Brachiocephalic Fistula;  Surgeon: Cain, Brandon Christopher, MD;  Location: [facility] OR;  Service: Vascular;  Laterality: Left;  . FISTULA SUPERFICIALIZATION Left 11/29/2018   Procedure: TRANSLOCATION/FISTULA SUPERFICIALIZATION OF LEFT ARM FISTULA;  Surgeon: Clark, Christopher J, MD;  Location: [facility] OR;  Service: Vascular;  Laterality: Left;  . LIVER TRANSPLANT     19 96  . TONSILLECTOMY      FAMILY HISTORY: Family History  Problem Relation Age of Onset  . Stroke Mother   . Cancer Father   . Lupus Sister        1 sister  . Hypertension Sister   . Cancer Brother        prostate- 1  brother  . Lupus Brother     SOCIAL HISTORY: Social History   Socioeconomic History  . Marital status: Divorced    Spouse name: Not on file  . Number of children: 2  . Years of education: some college  . Highest education level: Not on file  Occupational History    Comment: na  Tobacco Use  . Smoking status: Never Smoker  . Smokeless tobacco: Never Used  . Tobacco comment: never used tobacco  Substance and Sexual Activity  . Alcohol use: Not  Currently    Alcohol/week: 0.0 standard drinks  . Drug use: No  . Sexual activity: Not on file  Other Topics Concern  . Not on file  Social History Narrative   Lives with son   Caffeine - tea , little    Social Determinants of Health   Financial Resource Strain:   . Difficulty of Paying Living Expenses:   Food Insecurity:   . Worried About Charity fundraiser in the Last Year:   . Arboriculturist in the Last Year:   Transportation Needs:   . Film/video editor (Medical):   Marland Kitchen Lack of Transportation (Non-Medical):   Physical Activity:   . Days of Exercise per Week:   . Minutes of Exercise per Session:   Stress:   . Feeling of Stress :   Social Connections:   . Frequency of Communication with Friends and Family:   . Frequency of Social Gatherings with Friends and Family:   . Attends Religious Services:   . Active Member of Clubs or Organizations:   . Attends Archivist Meetings:   Marland Kitchen Marital Status:   Intimate Partner Violence:   . Fear of Current or Ex-Partner:   . Emotionally Abused:   Marland Kitchen Physically Abused:   . Sexually Abused:       PHYSICAL EXAM  There were no vitals filed for this visit. There is no height or weight on file to calculate BMI.  Generalized: Well developed, in no acute distress  Cardiology: normal rate and rhythm, no murmur noted Respiratory: clear to auscultation bilaterally  Neurological examination  Mentation: Alert oriented to time, place, history taking. Follows all commands  speech and language fluent Cranial nerve II-XII: Pupils were equal round reactive to light. Extraocular movements were full, visual field were full on confrontational test. Facial sensation and strength were normal. Uvula tongue midline. Head turning and shoulder shrug  were normal and symmetric. Motor: The motor testing reveals 5 over 5 strength of all 4 extremities. Good symmetric motor tone is noted throughout.  Sensory: Sensory testing is intact to soft touch on all 4 extremities. No evidence of extinction is noted.  Coordination: Cerebellar testing reveals good finger-nose-finger and heel-to-shin bilaterally.  Gait and station: Gait is normal. Tandem gait is normal. Romberg is negative. No drift is seen.  Reflexes: Deep tendon reflexes are symmetric and normal bilaterally.   DIAGNOSTIC DATA (LABS, IMAGING, TESTING) - I reviewed patient records, labs, notes, testing and imaging myself where available.  No flowsheet data found.   Lab Results  Component Value Date   WBC 4.5 02/20/2020   HGB 10.5 (L) 02/20/2020   HCT 32.8 (L) 02/20/2020   MCV 101.2 (H) 02/20/2020   PLT 106 (L) 02/20/2020      Component Value Date/Time   NA 137 02/20/2020 1411   NA 137 08/10/2018 0000   NA 140 10/13/2017 1106   NA 138 04/11/2017 1023   K 2.8 (LL) 02/20/2020 1411   K 5.3 no visable hemolysis (H) 10/13/2017 1106   K 5.2 No visable hemolysis (H) 04/11/2017 1023   CL 92 (L) 02/20/2020 1411   CL 104 10/13/2017 1106   CO2 32 02/20/2020 1411   CO2 17 (L) 10/13/2017 1106   CO2 19 (L) 04/11/2017 1023   GLUCOSE 116 (H) 02/20/2020 1411   GLUCOSE 116 10/13/2017 1106   BUN 13 02/20/2020 1411   BUN 1 (A) 08/10/2018 0000   BUN 58 (H) 10/13/2017 1106   BUN 33.4 (  H) 04/11/2017 1023   CREATININE 4.00 (HH) 02/20/2020 1411   CREATININE 5.9 (HH) 10/13/2017 1106   CREATININE 1.8 (H) 04/11/2017 1023   CALCIUM 9.3 02/20/2020 1411   CALCIUM 8.4 10/13/2017 1106   CALCIUM 9.0 04/11/2017 1023   PROT 7.9 02/20/2020  1411   PROT 7.7 10/13/2017 1106   PROT 8.8 (H) 04/11/2017 1023   ALBUMIN 3.6 02/20/2020 1411   ALBUMIN 3.3 10/13/2017 1106   ALBUMIN 3.7 04/11/2017 1023   AST 15 02/20/2020 1411   AST 16 04/11/2017 1023   ALT 5 02/20/2020 1411   ALT 15 10/13/2017 1106   ALT 8 04/11/2017 1023   ALKPHOS 71 02/20/2020 1411   ALKPHOS 68 10/13/2017 1106   ALKPHOS 80 04/11/2017 1023   BILITOT 0.5 02/20/2020 1411   BILITOT 0.54 04/11/2017 1023   GFRNONAA 11 (L) 02/20/2020 1411   GFRAA 13 (L) 02/20/2020 1411   Lab Results  Component Value Date   CHOL 124 10/03/2018   HDL 23 (L) 10/03/2018   LDLCALC 73 10/03/2018   TRIG 138 10/03/2018   CHOLHDL 5.4 10/03/2018   No results found for: HGBA1C Lab Results  Component Value Date   VITAMINB12 301 10/03/2018   Lab Results  Component Value Date   TSH 0.831 10/03/2018       ASSESSMENT AND PLAN 64 y.o. year old female  has a past medical history of Anemia, Arthritis, ESRD (end stage renal disease) on dialysis ([place]), Gout, Hepatitis, autoimmune ([place]) (12/08/2011), Hypertension, and Seizures ([place]) (10/03/2018). here with ***  No diagnosis found.     No orders of the defined types were placed in this encounter.    No orders of the defined types were placed in this encounter.     I spent 15 minutes with the patient. 50% of this time was spent counseling and educating patient on plan of care and medications.    Debbora Presto, FNP-C 02/26/2020, 4:36 PM [facility] [address] [place], [place] [address] [phone]

## 2020-02-27 ENCOUNTER — Encounter: Payer: Self-pay | Admitting: Family Medicine

## 2020-02-27 ENCOUNTER — Ambulatory Visit: Payer: Self-pay | Admitting: Family Medicine

## 2020-03-02 DIAGNOSIS — Z79899 Other long term (current) drug therapy: Principal | ICD-10-CM

## 2020-03-02 DIAGNOSIS — K769 Liver disease, unspecified: Principal | ICD-10-CM

## 2020-03-02 DIAGNOSIS — Z944 Liver transplant status: Principal | ICD-10-CM

## 2020-03-06 NOTE — Unmapped (Signed)
[facility] Clinical Assessment & Refill Coordination Note    Katherine Norton, Katherine Norton: 1956/05/27  Phone: [phone] (home)     All above HIPAA information was verified with patient.     Was a Nurse, learning disability used for this call? No    Specialty Medication(s):   Transplant: cyclosporine 25mg      Current Outpatient Medications   Medication Sig Dispense Refill   ??? acetaminophen (TYLENOL) 325 MG tablet 2 tablets (650 mg total) by G-tube route every six (6) hours as needed.  0   ??? albuterol 2.5 mg/0.5 mL nebulizer solution Inhale 0.5 mL (2.5 mg total) by nebulization every six (6) hours as needed for wheezing or shortness of breath. 30 each 12   ??? amLODIPine (NORVASC) 5 MG tablet Take 5 mg by mouth daily.     ??? calcitonin, salmon, (MIACALCIN) 200 unit/actuation nasal spray 1 spray by Alternating Nares route once daily.      ??? chlorhexidine (PERIDEX) 0.12 % solution 5 mL by Mouth route two (2) times a day. 1 Bottle 0   ??? cycloSPORINE modified (NEORAL) 25 MG capsule Take 2 capsules (50 mg total) by mouth Two (2) times a day. 360 capsule 3   ??? dextrose (D10W) 10% Soln bolus Infuse 125 mL into a venous catheter every thirty (30) minutes as needed. (Patient not taking: Reported on 03/06/2020)  0   ??? dextrose 5 % and sodium chloride 0.9 % (D5-NS) infu Infuse 25 mL/hr into a venous catheter continuous as needed (if tube feed held or NPO).  0   ??? famotidine (PEPCID) 20 MG tablet 1 tablet (20 mg total) by G-tube route daily. (Patient taking differently: Take 20 mg by mouth daily. ) 30 tablet 0   ??? gentamicin 1 mg/mL, sodium citrate 4% Gent locks with dialysis 2.4 mL 4   ??? heparin sodium,porcine (HEPARIN, PORCINE,) 1,000 unit/mL 1000 unit/mL injection This heparin is for dialysis use ONLY. RN if you are unable to aspirate from both lumes, call provider piror to using the catheter. 1 mL 0   ??? heparin sodium,porcine (HEPARIN, PORCINE,) 5,000 unit/mL injection Inject 1 mL (5,000 Units total) under the skin every eight (8) hours. 1 mL 0   ??? insulin lispro (HUMALOG) 100 unit/mL injection Inject 0-0.12 mL (0-12 Units total) under the skin Every six (6) hours. (Patient taking differently: Inject 0-12 Units under the skin Three (3) times a day before meals. ) 1440 Units 0   ??? melatonin 3 mg Tab Take 1 tablet (3 mg total) by mouth nightly.  0   ??? oxyCODONE (ROXICODONE) 5 MG immediate release tablet 1 tablet (5 mg total) by G-tube route every six (6) hours as needed. for up to 5 days (Patient taking differently: Take 5 mg by mouth every six (6) hours as needed. ) 10 tablet 0     No current facility-administered medications for this visit.        Changes to medications: [name] reports no changes at this time.    Allergies   Allergen Reactions   ??? Iodinated Contrast Media    ??? Ioxaglate Sodium    ??? Latex        Changes to allergies: No    SPECIALTY MEDICATION ADHERENCE     Cyclosporine 25mg   : 10 days of medicine on hand     Medication Adherence    Patient reported X missed doses in the last month: 0  Specialty Medication: cyclosporine 25mg   Specialty medication(s) dose(s) confirmed: Regimen is correct and unchanged.     Are there any concerns with adherence? No    Adherence counseling provided? Not needed    CLINICAL MANAGEMENT AND INTERVENTION      Clinical Benefit Assessment:    Do you feel the medicine is effective or helping your condition? Yes    Clinical Benefit counseling provided? Not needed    Adverse Effects Assessment:    Are you experiencing any side effects? No    Are you experiencing difficulty administering your medicine? No    Quality of Life Assessment:    How many days over the past month did your transplant  keep you from your normal activities? For example, brushing your teeth or getting up in the morning. 0    Have you discussed this with your provider? Not needed    Therapy Appropriateness:    Is therapy appropriate? Yes, therapy is appropriate and should be continued DISEASE/MEDICATION-SPECIFIC INFORMATION      N/A    PATIENT SPECIFIC NEEDS     - Does the patient have any physical, cognitive, or cultural barriers? No    - Is the patient high risk? No     - Does the patient require a Care Management Plan? No     - Does the patient require physician intervention or other additional services (i.e. nutrition, smoking cessation, social work)? No      SHIPPING     Specialty Medication(s) to be Shipped:   Transplant: cyclosporine 25mg     Other medication(s) to be shipped: na     Changes to insurance: No    Delivery Scheduled: Yes, Expected medication delivery date: 03/12/2020.     Medication will be delivered via UPS to the confirmed prescription address in [facility].    The patient will receive a drug information handout for each medication shipped and additional FDA Medication Guides as required.  Verified that patient has previously received a Conservation officer, historic buildings.    All of the patient's questions and concerns have been addressed.    Katherine Norton   [facility] Specialty Pharmacist

## 2020-03-11 MED FILL — CYCLOSPORINE MODIFIED 25 MG CAPSULE: 30 days supply | Qty: 120 | Fill #3 | Status: AC

## 2020-03-11 MED FILL — CYCLOSPORINE MODIFIED 25 MG CAPSULE: ORAL | 30 days supply | Qty: 120 | Fill #3

## 2020-03-19 ENCOUNTER — Encounter: Admit: 2020-03-19 | Discharge: 2020-03-19 | Payer: MEDICAID | Attending: Nephrology | Primary: Nephrology

## 2020-03-19 ENCOUNTER — Encounter: Admit: 2020-03-19 | Discharge: 2020-03-19 | Payer: MEDICAID

## 2020-03-19 ENCOUNTER — Ambulatory Visit: Admit: 2020-03-19 | Discharge: 2020-03-19 | Payer: MEDICAID

## 2020-03-19 ENCOUNTER — Ambulatory Visit: Admit: 2020-03-19 | Discharge: 2020-03-19 | Payer: MEDICARE

## 2020-03-19 DIAGNOSIS — Z992 Dependence on renal dialysis: Principal | ICD-10-CM

## 2020-03-19 DIAGNOSIS — N186 End stage renal disease: Principal | ICD-10-CM

## 2020-03-19 DIAGNOSIS — Z01818 Encounter for other preprocedural examination: Principal | ICD-10-CM

## 2020-03-19 DIAGNOSIS — Z7682 Awaiting organ transplant status: Principal | ICD-10-CM

## 2020-03-19 LAB — CBC W/ AUTO DIFF
BASOPHILS ABSOLUTE COUNT: 0 10*9/L (ref 0.0–0.1)
BASOPHILS RELATIVE PERCENT: 0.3 %
EOSINOPHILS ABSOLUTE COUNT: 0 10*9/L (ref 0.0–0.4)
EOSINOPHILS RELATIVE PERCENT: 0.8 %
HEMOGLOBIN: 11.2 g/dL — ABNORMAL LOW (ref 12.0–16.0)
LARGE UNSTAINED CELLS: 4 % (ref 0–4)
LYMPHOCYTES ABSOLUTE COUNT: 0.8 10*9/L — ABNORMAL LOW (ref 1.5–5.0)
MEAN CORPUSCULAR HEMOGLOBIN CONC: 31.3 g/dL (ref 31.0–37.0)
MEAN CORPUSCULAR HEMOGLOBIN: 31.8 pg (ref 26.0–34.0)
MEAN CORPUSCULAR VOLUME: 101.4 fL — ABNORMAL HIGH (ref 80.0–100.0)
MEAN PLATELET VOLUME: 9.3 fL (ref 7.0–10.0)
MONOCYTES ABSOLUTE COUNT: 0.3 10*9/L (ref 0.2–0.8)
MONOCYTES RELATIVE PERCENT: 10.8 %
NEUTROPHILS ABSOLUTE COUNT: 1.7 10*9/L — ABNORMAL LOW (ref 2.0–7.5)
NEUTROPHILS RELATIVE PERCENT: 56.4 %
PLATELET COUNT: 253 10*9/L (ref 150–440)
RED BLOOD CELL COUNT: 3.52 10*12/L — ABNORMAL LOW (ref 4.00–5.20)

## 2020-03-19 LAB — EGFR CKD-EPI NON-AA FEMALE
Glomerular filtration rate/1.73 sq M.predicted.non black:ArVRat:Pt:Ser/Plas/Bld:Qn:Creatinine-based formula (CKD-EPI): 13 — ABNORMAL LOW

## 2020-03-19 LAB — HLA ANTIBODY SCR

## 2020-03-19 LAB — LACTATE DEHYDROGENASE: Lactate dehydrogenase:CCnc:Pt:Ser/Plas:Qn:Reaction: pyruvate to lactate: 328 — ABNORMAL LOW

## 2020-03-19 LAB — PROTEIN C ACTIVITY: Lab: 75

## 2020-03-19 LAB — SYPHILIS RPR SCREEN: Reagin Ab:PrThr:Pt:Ser:Ord:RPR: NONREACTIVE

## 2020-03-19 LAB — APTT: APTT: 30.3 s (ref 25.3–37.1)

## 2020-03-19 LAB — HOMOCYSTEINE PLASMA: Homocysteine:SCnc:Pt:Ser/Plas:Qn:: 7.4

## 2020-03-19 LAB — LIPID PANEL
CHOLESTEROL/HDL RATIO SCREEN: 4.5 (ref <5.0)
CHOLESTEROL: 139 mg/dL (ref 100–199)
HDL CHOLESTEROL: 31 mg/dL — ABNORMAL LOW (ref 40–59)
NON-HDL CHOLESTEROL: 108 mg/dL
VLDL CHOLESTEROL CAL: 18.4 mg/dL (ref 11–41)

## 2020-03-19 LAB — LUPUS INHIBITOR PANEL
DILUTE RUSSELL VIPER VENOM TIME: 40.9 s (ref 0.0–55.7)
PTT LUPUS ANTICOAGULANT: 46.9 s (ref 0.0–48.4)

## 2020-03-19 LAB — PROTIME: Coagulation tissue factor induced:Time:Pt:PPP:Qn:Coag: 12.2

## 2020-03-19 LAB — CREATININE: CREATININE: 3.5 mg/dL — ABNORMAL HIGH (ref 0.60–1.00)

## 2020-03-19 LAB — FIBRINOGEN LEVEL: Fibrinogen:MCnc:Pt:PPP:Qn:Coag: 355

## 2020-03-19 LAB — HEPATIC FUNCTION PANEL
ALBUMIN: 3.8 g/dL (ref 3.5–5.0)
ALKALINE PHOSPHATASE: 90 U/L (ref 38–126)
ALT (SGPT): 7 U/L (ref <35)
BILIRUBIN DIRECT: 0.3 mg/dL (ref 0.00–0.40)

## 2020-03-19 LAB — SMEAR REVIEW

## 2020-03-19 LAB — URIC ACID: Urate:MCnc:Pt:Ser/Plas:Qn:: 3.2

## 2020-03-19 LAB — ANTITHROMB III, FUNC: Lab: 98

## 2020-03-19 LAB — PROTEIN TOTAL: Protein:MCnc:Pt:Ser/Plas:Qn:: 8.2

## 2020-03-19 LAB — VLDL CHOLESTEROL CAL: Cholesterol.in VLDL:MCnc:Pt:Ser/Plas:Qn:Calculated: 18.4

## 2020-03-19 LAB — BLOOD UREA NITROGEN: Urea nitrogen:MCnc:Pt:Ser/Plas:Qn:: 13

## 2020-03-19 LAB — PTT LUPUS ANTICOAGULANT: Lab: 46.9

## 2020-03-19 LAB — HEMATOCRIT: Hematocrit:VFr:Pt:Bld:Qn:: 35.7 — ABNORMAL LOW

## 2020-03-19 LAB — HEPARIN CORRELATION: Lab: 0.2

## 2020-03-19 LAB — PROTEIN S ANTIGEN FREE: Lab: 72

## 2020-03-19 NOTE — Unmapped (Signed)
Pt attended   1 hour [facility] orientation class.    KDPI and hep c consent form signed

## 2020-03-19 NOTE — Unmapped (Signed)
[facility]   TRANSPLANT RECIPIENT EVALUATION     Referring Provider:  Transplant Surgery     Date of Visit: 03/19/2020     Assessment/Recommendations:   1. Transplant Recipient Evaluation - Patient is an acceptable but high risk candidate.    - Lack of confirmed social support  - Vascular calcifications on last CT scan in 2019  - Recent falls - ambulates with cane occasionally can do her daily activities  - Chronic leucopenia    We need to start with CT abdomen and pelvis first, once cleared with surgeons, need following evaluations:    - Frailty evaluation in 6-12 months with Drs. True or Kleman  - Neurology for weakness and falls  - Pain management to make sure she is on stable pain medications  - Transplant psychology for depression      2. Cardiac clearance: EKG + ECHO + Stress test + Cardiology.   3. Age appropriate cancer screening.   PAP smear  Colonoscopy  Mammogram    Patient explained in detail:   1) Donor criteria.   2) Increase risk of diabetes post transplant.   3) Increase risk of infections post surgery.   4) Increase risk of cancers.   5) Increase risk of heart attack esp. During 1st 3 months post surgery.   6) May need dialysis (hemodialysis or peritoneal dialysis) post transplant.   7) Need for a kidney biopsy post transplant and treatment with medications as required.   8) Importance of being compliant with the medications and follow up appointment with the physicians.   9) Side effects of immunosuppression.       History of Present Illness: Katherine Norton is a 64 y.o. y.o. female who is referred for transplant recipient evaluation.     HD days: MWF   Dialysis unit: [facility]  Access: Left AVF  Nephrologist: Dr. 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 Harrietta Guardian with a last f/u note in 2016.  Her creatinine was slowly increasing from a 1.1 in 2000 to 1.2 in 2014. During the hospital admission in 06/2018 she had to be initiated on dialysis. She was transferred from [facility] in 05/2018 for acute SOB and worsening M.S. She had multifocal PNA leading to ARDS. She was intubated and required tracheostomy and PEG tube placement. Her Tracheostomy was removed 06/2018 and PEG tube in 11/19. During the ICU admission she also had a PEA arrest and resuscitated for 1 minute.    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  Treatment with Immunosuppression in past: Currently on neoral.  Listed at any other transplant center: No    H/O of frequent Urinary infection in past: No  H/O recurrent infections: No   H/O hematuria: No  Blood Transfusion: 2019      Pregnancy: 2 - no abortion, miscarriage.    Smoking: No  Alcohol: No    History of:   MI: No  Stroke: No  COPD/Emphysema/Asthma: Uses inhalers occasionally - no diagnosis.   Cancers: No  Depression/Anxiety: she recently lost her son and is currently seeing a therapist.   Tuberculosis or positive PPD: No  Drug  abuse in past: No  Hypercoagulable state: No  Polycystic disease: No  Peripheral vascular disease:No   Kidney stones: No  Chronic pancytopenia - evaluated by Heme-Onc  Ambulates with cane - sometimes she feels her balance is off, however had few falls earlier this month. She has seen a new neurologist because of these falls - he thinks it may have been due to arthritis? Pain management have recommended PT.  H/o chronic low back pain - ? H/o vertebral compression fractures in lower thoracic spine - has been seen by rheumatologist and pain specialist in the past. Was and is still on chronic opioids.   H/o seizures - during an office visit in 09/2018, she became unresponsive and had some jerking movement. She was taken to the ER and had a second event of tonic-clonic seizure. She was given IV ativan and treated with keppra.  MRI and MRA of the head showed no acute findings, however she was noted to have severe left and moderate right ICA stenosis.    Education background: High school    Family support: Lives by herself. Trying to find a support.    Review of Systems:   General: negative   Eyes: negative   Ears/Nose/Throat: negative   Cardiovascular: negative   Respiratory: negative   Endocrine: negative   Gastrointestinal: negative   Genitourinary: negative   Musculoskeletal: negative   Neurologic: falls  Skin: negative   Psychiatric: depressed mood  Hematologic/Immunologic: negative   All other systems are negative.       Past Medical History:   Diagnosis Date   ??? Chronic kidney disease    ??? Cirrhosis (CMS-HCC)    ??? HTN (hypertension)         Past Surgical History:   Procedure Laterality Date   ??? BRONCHOSCOPY  06/03/2018        ??? HYSTERECTOMY     ??? 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 PR TRACHEOSTOMY, PLANNED Midline 06/26/2018    Procedure: TRACHEOSTOMY PLANNED (SEPART PROC);  Surgeon: Theodosia Blender, MD;  Location: MAIN OR [facility];  Service: ENT   ??? TONSILLECTOMY          Social History     Tobacco Use   Smoking Status Never Smoker   Smokeless Tobacco Never Used        Social History     Substance and Sexual Activity   Alcohol Use No   ??? Alcohol/week: 0.0 standard drinks        Family History   Problem Relation Age of Onset   ??? Hypertension Mother    ??? Hypertension Father    ??? Prostate cancer Father    ??? Lupus Sister    ??? Lupus Brother    ??? Kidney disease Neg Hx         Current Medications:   Current Outpatient Medications   Medication Sig Dispense Refill   ??? acetaminophen (TYLENOL) 325 MG tablet 2 tablets (650 mg total) by G-tube route every six (6) hours as needed.  0   ??? albuterol 2.5 mg/0.5 mL nebulizer solution Inhale 0.5 mL (2.5 mg total) by nebulization every six (6) hours as needed for wheezing or shortness of breath. 30 each 12 ??? amLODIPine (NORVASC) 5 MG tablet Take 5 mg by mouth daily.     ??? calcitonin, salmon, (MIACALCIN) 200 unit/actuation nasal spray 1 spray by Alternating Nares route once daily.      ??? chlorhexidine (PERIDEX) 0.12 % solution  5 mL by Mouth route two (2) times a day. 1 Bottle 0   ??? cycloSPORINE modified (NEORAL) 25 MG capsule Take 2 capsules (50 mg total) by mouth Two (2) times a day. 360 capsule 3   ??? dextrose (D10W) 10% Soln bolus Infuse 125 mL into a venous catheter every thirty (30) minutes as needed. (Patient not taking: Reported on 03/06/2020)  0   ??? dextrose 5 % and sodium chloride 0.9 % (D5-NS) infu Infuse 25 mL/hr into a venous catheter continuous as needed (if tube feed held or NPO).  0   ??? famotidine (PEPCID) 20 MG tablet 1 tablet (20 mg total) by G-tube route daily. (Patient taking differently: Take 20 mg by mouth daily. ) 30 tablet 0   ??? gentamicin 1 mg/mL, sodium citrate 4% Gent locks with dialysis 2.4 mL 4   ??? heparin sodium,porcine (HEPARIN, PORCINE,) 1,000 unit/mL 1000 unit/mL injection This heparin is for dialysis use ONLY. RN if you are unable to aspirate from both lumes, call provider piror to using the catheter. 1 mL 0   ??? heparin sodium,porcine (HEPARIN, PORCINE,) 5,000 unit/mL injection Inject 1 mL (5,000 Units total) under the skin every eight (8) hours. 1 mL 0   ??? insulin lispro (HUMALOG) 100 unit/mL injection Inject 0-0.12 mL (0-12 Units total) under the skin Every six (6) hours. (Patient taking differently: Inject 0-12 Units under the skin Three (3) times a day before meals. ) 1440 Units 0   ??? melatonin 3 mg Tab Take 1 tablet (3 mg total) by mouth nightly.  0   ??? oxyCODONE (ROXICODONE) 5 MG immediate release tablet 1 tablet (5 mg total) by G-tube route every six (6) hours as needed. for up to 5 days (Patient taking differently: Take 5 mg by mouth every six (6) hours as needed. ) 10 tablet 0     No current facility-administered medications for this visit.       Allergies: Iodinated contrast media, Ioxaglate sodium, and Latex     Physical Exam:   VS: Blood pressure 114/75, pulse 79, temperature 35.9 ??C (96.7 ??F), temperature source Temporal, weight 49.8 kg (109 lb 12.8 oz).   General Appearance: No acute distress   Eyes: PERRL; lids and conjunctiva normal   Ears/Nose/Throat/Mouth: wearing mask  Neck: No carotid bruits  Cardiovascular: Regular without murmur or rub   Lungs: Clear to auscultation bilaterally   Abdomen: soft, NT, no hepatosplenomegaly   Extremities: MAE x 4, no LE edema   Skin: Warm and dry, no rashes, no nodules   Neuro: CN2-12 grossly intact, no obvious focal deficits; 4/5 muscle strength LE b/l (left weaker than right), 5/5 UE B/L sensation grossly intact   Psychiatric: Mood and affect normal; AAO x 3       Laboratory Studies:   Lab Results   Component Value Date    WBC 3.6 02/06/2020    HGB 10.4 (L) 02/06/2020    HCT 30.6 (L) 02/06/2020    PLT 234 02/06/2020       Lab Results   Component Value Date    NA 139 02/06/2020    K 3.6 02/06/2020    CL 92 (L) 02/06/2020    CO2 26 02/06/2020    BUN 7 (L) 02/06/2020    CREATININE 3.32 (H) 02/06/2020    GLU 90 05/02/2019    CALCIUM 9.1 02/06/2020    MG 2.1 02/06/2020    PHOS 5.4 (H) 05/02/2019       Lab Results  Component Value Date    BILITOT 0.3 02/06/2020    BILIDIR 0.13 02/06/2020    PROT 7.8 02/06/2020    ALBUMIN 3.9 05/02/2019    ALT 5 02/06/2020    AST 15 02/06/2020    ALKPHOS 98 02/06/2020    GGT 12 02/06/2020       Lab Results   Component Value Date    LABPROT 11.2 05/22/2015    INR 1.15 06/28/2018    APTT 29.5 06/28/2018           Electronically signed by:   Leeroy Bock, MD   [facility]

## 2020-03-19 NOTE — Unmapped (Signed)
ekg done at [facility]

## 2020-03-20 ENCOUNTER — Ambulatory Visit: Admit: 2020-03-20 | Discharge: 2020-03-21 | Payer: MEDICARE

## 2020-03-20 LAB — ANTICARDIOLIPIN IGG ANTIBODY: Lab: 22

## 2020-03-20 LAB — HEMOGLOBIN A1C: HEMOGLOBIN A1C: 4.6 % — ABNORMAL LOW (ref 4.8–5.6)

## 2020-03-20 LAB — HEPATITIS B SURFACE ANTIBODY QUANT: Hepatitis B virus surface Ab:ACnc:Pt:Ser:Qn:: <8

## 2020-03-20 LAB — HEPATITIS C ANTIBODY
HEPATITIS C ANTIBODY: NONREACTIVE
Hepatitis C virus Ab:PrThr:Pt:Ser:Ord:: NONREACTIVE

## 2020-03-20 LAB — CARDIOLIPIN ANTIBODY, IGM/IGG: ANTICARDIOLIPIN IGM ANTIBODY: 14 [MPL'U] — ABNORMAL HIGH (ref 0–11)

## 2020-03-20 LAB — HEPATITIS B SURFACE ANTIGEN: Hepatitis B virus surface Ag:PrThr:Pt:Ser:Ord:: NONREACTIVE

## 2020-03-20 LAB — ESTIMATED AVERAGE GLUCOSE: Estimated average glucose:MCnc:Pt:Bld:Qn:Estimated from glycated hemoglobin: 85

## 2020-03-20 LAB — HEPATITIS A IGG: Hepatitis A virus Ab.IgG:PrThr:Pt:Ser:Ord:: NONREACTIVE

## 2020-03-20 LAB — BETA-2 GLYCOPROTEIN 1 IGG ANTIBODY: Lab: 7

## 2020-03-20 LAB — HEPATITIS B CORE TOTAL ANTIBODY: Hepatitis B virus core Ab:PrThr:Pt:Ser/Plas:Ord:IA: NONREACTIVE

## 2020-03-20 LAB — HIV ANTIGEN/ANTIBODY COMBO: HIV 1+2 Ab+HIV1 p24 Ag:PrThr:Pt:Ser/Plas:Ord:IA: NONREACTIVE

## 2020-03-24 LAB — QUANTIFERON TB GOLD PLUS
QUANTIFERON ANTIGEN 2 MINUS NIL: 0.05 [IU]/mL
QUANTIFERON MITOGEN: 3.08 [IU]/mL
QUANTIFERON TB GOLD PLUS: NEGATIVE
QUANTIFERON TB NIL VALUE: 0.05 [IU]/mL

## 2020-03-24 LAB — TB AG2 VALUE: Lab: 0.1

## 2020-03-24 LAB — HSV ANTIBODIES, IGG: HERPES SIMPLEX VIRUS 1 IGG: POSITIVE — AB

## 2020-03-24 LAB — TOXOPLASMA GONDII IGG: Toxoplasma gondii Ab.IgG:PrThr:Pt:Ser:Ord:: NEGATIVE

## 2020-03-24 LAB — VARICELLA ZOSTER IGG: Varicella zoster virus Ab.IgG:PrThr:Pt:Ser:Ord:: POSITIVE

## 2020-03-24 LAB — C-PEPTIDE: C peptide^post CFst:MCnc:Pt:Ser/Plas:Qn:: 6.4 — ABNORMAL HIGH

## 2020-03-24 LAB — EPSTEIN-BARR VCA IGG ANTIBODY: Epstein Barr virus capsid Ab.IgG:PrThr:Pt:Ser:Ord:: POSITIVE — AB

## 2020-03-24 LAB — HERPES SIMPLEX VIRUS 1 IGG: Lab: POSITIVE — AB

## 2020-03-24 LAB — LUPUS INHIBITOR PANEL
DILUTE RUSSELL VIPER VENOM TIME: 40.9 s (ref 0.0–55.7)
PTT LUPUS ANTICOAGULANT: 46.9 s (ref 0.0–48.4)

## 2020-03-24 LAB — TB NIL VALUE: Lab: 0.05

## 2020-03-24 LAB — TB MITOGEN VALUE: Lab: 3.13

## 2020-03-24 LAB — TB AG1 VALUE: Lab: 0.04

## 2020-03-24 LAB — QUANTIFERON TB NIL VALUE: Lab: 0.05

## 2020-03-24 LAB — RUBELLA IGG SCREEN: Rubella virus Ab.IgG:PrThr:Pt:Ser:Ord:: POSITIVE

## 2020-03-24 LAB — CMV IGG: Cytomegalovirus Ab.IgG:Imp:Pt:Ser/Plas:Nom:: POSITIVE — AB

## 2020-03-25 NOTE — Unmapped (Signed)
TFC created a consultation letter and sent it to the patient in advance of our phone consultation on Thursday, March 26, 2020 at 10:30 am.

## 2020-03-26 ENCOUNTER — Ambulatory Visit: Admit: 2020-03-26 | Discharge: 2020-03-27 | Payer: MEDICARE

## 2020-03-26 ENCOUNTER — Institutional Professional Consult (permissible substitution): Admit: 2020-03-26 | Discharge: 2020-03-27 | Payer: MEDICARE

## 2020-03-26 NOTE — Unmapped (Signed)
2021 Phone Consultation    TFC had a phone consultation today 03/26/2020, with patient regarding current transplant insurance plan benefits. The patient consented to performing the [facility] consultation via phone. The patient currently has Medicare Part B Only primary and Medicaid secondary insurance and no tertiary insurance. Patient has [employer] for [employer] benefits and it is active at this time. I discussed the Financial Agreement with the patient and she acknowledge that she understood and had no questions at this time. I explained if she had questions in the future to contact Caryl Never at [phone] and she would answer any and all questions.

## 2020-03-26 NOTE — Unmapped (Signed)
Clinical Social Worker Progress Note    Name:Katherine Norton  Date of Birth:Dec 12, 1955  ZOX:[id number]    RE: RESCHEDULE      Noted that patient had appointment scheduled today at 9:30am.  Called pt at appointment time.  Pt sounded fatigued on phone and stated that she was exhausted.  Did not appear to be prepared for today's SW appt and did not have her CG present.  After some discussion, pt would like to reschedule today's SW and TFC appt.    At this time, this CSW will be unable to see patient due to pt choice and lack of CG.  Pt is aware that she will need a CG present for her SW evaluation.  She was able to name 2 people but states that they are not available today for various reasons.    Patient will need to be rescheduled at this time.  TNC/Megan Zink notified of the above.      [name], LCSW, CCTSW  Transplant Case Manager  [facility]

## 2020-03-27 LAB — HLA C1 AB SCR: Lab: POSITIVE

## 2020-03-27 LAB — HLA C2 AB SCR: Lab: NEGATIVE

## 2020-03-30 LAB — CPRA%: Lab: 0

## 2020-03-30 LAB — FSAB CLASS 1 ANTIBODY SPECIFICITY

## 2020-04-02 NOTE — Unmapped (Signed)
Per NP Martin-Velez no need for CXR for annual this year since patient had one locally (see Care Everywhere) from 01/15/20.

## 2020-04-02 NOTE — Unmapped (Signed)
[facility] Refill Coordination Note    Specialty Medication(s) to be Shipped:   Transplant: cyclosporine 25mg     Other medication(s) to be shipped: N/A     Katherine Norton, DOB: 19-Dec-1955  Phone: [phone] (home)       All above HIPAA information was verified with patient.     Was a Nurse, learning disability used for this call? No    Completed refill call assessment today to schedule patient's medication shipment from the [facility] [phone]).       Specialty medication(s) and dose(s) confirmed: Regimen is correct and unchanged.   Changes to medications: Katherine Norton reports no changes at this time.  Changes to insurance: No  Questions for the pharmacist: No    Confirmed patient received Welcome Packet with first shipment. The patient will receive a drug information handout for each medication shipped and additional FDA Medication Guides as required.       DISEASE/MEDICATION-SPECIFIC INFORMATION        N/A    SPECIALTY MEDICATION ADHERENCE     Medication Adherence    Patient reported X missed doses in the last month: 0  Specialty Medication: Cyclosporine 25mg   Patient is on additional specialty medications: No        Cyclopsorine 25 mg: 14 days of medicine on hand     SHIPPING     Shipping address confirmed in Epic.     Delivery Scheduled: Yes, Expected medication delivery date: 04/10/2020.     Medication will be delivered via UPS to the prescription address in Epic WAM.    Katherine Norton [facility] Specialty Technician

## 2020-04-03 LAB — HLA CL I&II, LOW RES
BW #1: 4
BW #2: 4
LOW RES HLA A #1: 30
LOW RES HLA A #2: 33
LOW RES HLA B #1: 53
LOW RES HLA B #2: 57
LOW RES HLA C #1: 4
LOW RES HLA C #2: 7
LOW RES HLA DQ#2: 7
LOW RES HLA DR#1: 8
LOW RES HLA DR#2: 8

## 2020-04-03 LAB — LOW RES HLA A #1: Lab: 30

## 2020-04-06 DIAGNOSIS — N186 End stage renal disease: Principal | ICD-10-CM

## 2020-04-06 DIAGNOSIS — Z944 Liver transplant status: Principal | ICD-10-CM

## 2020-04-06 DIAGNOSIS — Z01818 Encounter for other preprocedural examination: Principal | ICD-10-CM

## 2020-04-06 DIAGNOSIS — Z01810 Encounter for preprocedural cardiovascular examination: Principal | ICD-10-CM

## 2020-04-06 DIAGNOSIS — R93429 Abnormal radiologic findings on diagnostic imaging of unspecified kidney: Principal | ICD-10-CM

## 2020-04-07 NOTE — Unmapped (Signed)
Called patient to discuss all 7/22 appointments. Informed patient I was able to coordinate few of liver eval appts on 7.22. patient stated verbal understanding of the appt schedule. Updated mailing address in Epic and appointment letter sent to patient via Epic letter.

## 2020-04-08 NOTE — Unmapped (Signed)
Spoke to Katherine Norton about changing the mfg on her cyclosporine because of a backorder issue.  Told her that when she starts the new mfg to call the clinic to get labs done.  Pt acknowledged understanding.

## 2020-04-17 MED FILL — CYCLOSPORINE MODIFIED 25 MG CAPSULE: 30 days supply | Qty: 120 | Fill #4 | Status: AC

## 2020-04-17 MED FILL — CYCLOSPORINE MODIFIED 25 MG CAPSULE: ORAL | 30 days supply | Qty: 120 | Fill #4

## 2020-05-06 NOTE — Unmapped (Addendum)
Attempted to call patient three separate times to follow up per her VM about appointment times but no answer.        Able to call patient and confirmed appointment times with her for her annual next week.  Resent copy of letter to her in the mail and confirmed her current address is correct in our system.

## 2020-05-07 NOTE — Unmapped (Unsigned)
[facility] Refill Coordination Note    Specialty Medication(s) to be Shipped:   Transplant: cyclosporine 25mg     Other medication(s) to be shipped: none     Sarea Fyfe, DOB: 09/03/1956  Phone: [phone] (home)       All above HIPAA information was verified with patient.     Was a Nurse, learning disability used for this call? No    Completed refill call assessment today to schedule patient's medication shipment from the [facility] [phone]).       Specialty medication(s) and dose(s) confirmed: Regimen is correct and unchanged.   Changes to medications: Iyla reports no changes at this time.  Changes to insurance: No  Questions for the pharmacist: No    Confirmed patient received Welcome Packet with first shipment. The patient will receive a drug information handout for each medication shipped and additional FDA Medication Guides as required.       DISEASE/MEDICATION-SPECIFIC INFORMATION        N/A    SPECIALTY MEDICATION ADHERENCE     Medication Adherence    Patient reported X missed doses in the last month: 0  Specialty Medication: Cyclosporine 25mg   Patient is on additional specialty medications: No                Cyclosporine 25 mg: 13 days of medicine on hand        SHIPPING     Shipping address confirmed in Epic.     Delivery Scheduled: Yes, Expected medication delivery date: 05/19/20.     Medication will be delivered via UPS to the prescription address in Epic WAM.    Tera Helper   [facility] Specialty Pharmacist

## 2020-05-13 NOTE — Unmapped (Signed)
7.21- 1st attempt, lvm

## 2020-05-13 NOTE — Unmapped (Unsigned)
FOLLOW UP ANNUAL LIVER CLINIC NOTE     Patient Name: Katherine Norton  Medical Record Number: [id number]  Date of Service: 05/14/2020    Referring Physician: Tressia Danas   Current complaint: Follow up Annual Liver    Assessment/Plan:     Katherine Norton is a 64 y.o. 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 Recent LFT testing/images WNL.  Immunosuppressive monotherapy goal (40-80). Continue neoral 50mg  BID, repeat trough, presumed not a true trough.    Competed Kidney Transplant testing today. Will review and make sure all is appropriate, namely vascular targets, and repeat frailty/nephrology reevaluation and surgeon appointment at that time.     She was encouraged to seek out living donor candidates and provided business card for living donor coordinator today.    HEALTH MAINTENANCE:   Immunization History   Administered Date(s) Administered   ??? COVID-19 VACCINE,MRNA(MODERNA)(PF)(IM) 12/23/2019, 01/23/2020   ??? Hepatitis B, Adult 08/24/2018, 09/24/2018, 10/26/2018, 02/22/2019, 05/22/2019, 06/21/2019, 07/19/2019, 11/22/2019   ??? Influenza Vaccine Quad (IIV4 PF) 14mo+ injectable 07/06/2017   ??? Influenza Virus Vaccine, unspecified formulation 07/01/2015, 08/22/2016, 07/06/2017, 07/26/2019   ??? PNEUMOCOCCAL POLYSACCHARIDE 23 08/22/2018, 09/03/2018   ??? Pneumococcal Conjugate 13-Valent 07/06/2017   ??? SHINGRIX-ZOSTER VACCINE (HZV), RECOMBINANT,SUB-UNIT,ADJUVANTED IM 01/15/2018, 05/14/2020   Shingrix vaccine series completed    Return to clinic: 1 year  Labs: Q 6-8 weeks    I personally spent 35 minutes face-to-face and non-face-to-face in the care of this patient, which includes all pre, intra, and post visit time on the date of service. Greater than 50% of the time was spent on counseling and the substance of the discussion.      Subjective:     HPI: Katherine Norton is a 64 y.o. 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 She returns today for her annual follow up. 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 She currently dialyizes via L upper arm AVF and doesn't make urine. She is tolerating HD well and has few changes to health over the last year but reports ongoing weight loss.    Denies fever, chills, arthralgias, and fatigue. Denies chest pain, SOB, N/V/D, constipation or abdominal pain. No acute complaint today.    Past Medical History:   Diagnosis Date   ??? Chronic kidney disease    ??? Cirrhosis (CMS-HCC)    ??? HTN (hypertension)        Past Surgical History:   Procedure Laterality Date   ??? BRONCHOSCOPY  06/03/2018        ??? HYSTERECTOMY     ??? 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 PR TRACHEOSTOMY, PLANNED Midline 06/26/2018    Procedure: TRACHEOSTOMY PLANNED (SEPART PROC);  Surgeon: Theodosia Blender, MD;  Location: MAIN OR [facility];  Service: ENT   ??? TONSILLECTOMY         Family History   Problem Relation Age of Onset   ??? Hypertension Mother    ??? Hypertension Father    ??? Prostate cancer Father    ??? Lupus Sister    ??? Lupus Brother    ??? Kidney disease Neg Hx        Social History     Socioeconomic History   ??? Marital status: Single     Spouse name: Not  on file   ??? Number of children: Not on file   ??? Years of education: Not on file   ??? Highest education level: Not on file   Occupational History   ??? Not on file   Tobacco Use   ??? Smoking status: Never Smoker   ??? Smokeless tobacco: Never Used   Substance and Sexual Activity   ??? Alcohol use: No     Alcohol/week: 0.0 standard drinks   ??? Drug use: No   ??? Sexual activity: Not on file   Other Topics Concern   ??? Not on file   Social History Narrative   ??? Not on file     Social Determinants of Health     Financial Resource Strain:    ??? Difficulty of Paying Living Expenses:    Food Insecurity:    ??? Worried About Programme researcher, broadcasting/film/video in the Last Year:    ??? Barista in the Last Year:    Transportation Needs:    ??? Freight forwarder (Medical):    ??? Lack of Transportation (Non-Medical):    Physical Activity:    ??? Days of Exercise per Week:    ??? Minutes of Exercise per Session:    Stress:    ??? Feeling of Stress :    Social Connections:    ??? Frequency of Communication with Friends and Family:    ??? Frequency of Social Gatherings with Friends and Family:    ??? Attends Religious Services:    ??? Database administrator or Organizations:    ??? Attends Engineer, structural:    ??? Marital Status:        REVIEW OF SYSTEMS:   The balance of 10/12 systems is negative with the exception of HPI.    Objective:     MEDICATIONS:  Allergies   Allergen Reactions   ??? Iodinated Contrast Media    ??? Ioxaglate Sodium    ??? Latex    ??? Succinylcholine Chloride      Other reaction(s): Other (See Comments)       Current Outpatient Medications   Medication Sig Dispense Refill   ??? acetaminophen (TYLENOL) 325 MG tablet 2 tablets (650 mg total) by G-tube route every six (6) hours as needed. (Patient taking differently: Take 650 mg by mouth every six (6) hours as needed (headaches). )  0   ??? albuterol 2.5 mg/0.5 mL nebulizer solution Inhale 0.5 mL (2.5 mg total) by nebulization every six (6) hours as needed for wheezing or shortness of breath. 30 each 12   ??? amLODIPine (NORVASC) 5 MG tablet Take 5 mg by mouth daily.     ??? calcitonin, salmon, (MIACALCIN) 200 unit/actuation nasal spray 1 spray by Alternating Nares route once daily.      ??? chlorhexidine (PERIDEX) 0.12 % solution 5 mL by Mouth route two (2) times a day. 1 Bottle 0   ??? cycloSPORINE modified (NEORAL) 25 MG capsule Take 2 capsules (50 mg total) by mouth Two (2) times a day. 360 capsule 3   ??? dextrose (D10W) 10% Soln bolus Infuse 125 mL into a venous catheter every thirty (30) minutes as needed.  0   ??? dextrose 5 % and sodium chloride 0.9 % (D5-NS) infu Infuse 25 mL/hr into a venous catheter continuous as needed (if tube feed held or NPO).  0   ??? famotidine (PEPCID) 20 MG tablet 1 tablet (20 mg total) by G-tube route daily. (Patient taking differently:  20 mg daily. ) 30 tablet 0   ??? gentamicin 1 mg/mL, sodium citrate 4% Gent locks with dialysis 2.4 mL 4   ??? heparin sodium,porcine (HEPARIN, PORCINE,) 1,000 unit/mL 1000 unit/mL injection This heparin is for dialysis use ONLY. RN if you are unable to aspirate from both lumes, call provider piror to using the catheter. 1 mL 0   ??? heparin sodium,porcine (HEPARIN, PORCINE,) 5,000 unit/mL injection Inject 1 mL (5,000 Units total) under the skin every eight (8) hours. 1 mL 0   ??? insulin lispro (HUMALOG) 100 unit/mL injection Inject 0-0.12 mL (0-12 Units total) under the skin Every six (6) hours. (Patient taking differently: Inject 0-12 Units under the skin Three (3) times a day before meals. ) 1440 Units 0   ??? melatonin 3 mg Tab Take 1 tablet (3 mg total) by mouth nightly.  0   ??? oxyCODONE (ROXICODONE) 5 MG immediate release tablet 1 tablet (5 mg total) by G-tube route every six (6) hours as needed. for up to 5 days (Patient taking differently: Take 5 mg by mouth every six (6) hours as needed. ) 10 tablet 0     No current facility-administered medications for this visit.         PHYSICAL EXAM:  BP 93/64  - Pulse 81  - Temp 36.2 ??C (97.1 ??F) (Tympanic)  - Ht 152.4 cm (5')  - Wt 51.8 kg (114 lb 3.2 oz)  - BMI 22.30 kg/m??     General Appearance:  NAD, well appearing but thin   HEENT:  [place]/AT. Well hydrated moist mucous membranes of the oral cavity. No scleral icterus. No cervical lymphadenopathy.   Pulmonary:    Normal respiratory effort. CTAB, without wheezes/crackles/rhonchi. Good air movement.    Cardiovascular:  Regular rate and rhythm, no murmur noted.   Extremities No edema. Ecchymosis to LUE with swelling to LUE above AVF   Abdomen:   Normoactive bowel sounds, abdomen soft, non-tender and not distended, no Hepatosplenomegaly or masses. Abdominal scar well healed without hernia.    Musculoskeletal: No joint tenderness, full ROM. Normal gait.    Skin: Skin color, texture, turgor normal, no rashes or lesions.   Neurologic: Grossly intact.   Psychiatric: Judgement and insight appropriate.        LAB RESULTS:  All lab results last 24 hours:    Recent Results (from the past 48 hour(s))   Cyclosporine, Trough    Collection Time: 05/14/20  8:35 AM   Result Value Ref Range    Cyclosporine, Trough 229 100 - 400 ng/mL   Gamma GT    Collection Time: 05/14/20  8:35 AM   Result Value Ref Range    GGT 18 0 - 38 U/L   Magnesium Level    Collection Time: 05/14/20  8:35 AM   Result Value Ref Range    Magnesium 2.2 1.6 - 2.6 mg/dL   Phosphorus Level    Collection Time: 05/14/20  8:35 AM   Result Value Ref Range    Phosphorus 7.5 (H) 2.4 - 5.1 mg/dL   Bilirubin, Direct    Collection Time: 05/14/20  8:35 AM   Result Value Ref Range    Bilirubin, Direct 0.20 0.00 - 0.30 mg/dL   Comprehensive Metabolic Panel    Collection Time: 05/14/20  8:35 AM   Result Value Ref Range    Sodium 139 135 - 145 mmol/L    Potassium 3.9 3.4 - 4.5 mmol/L    Chloride 96 (L) 98 - 107  mmol/L    Anion Gap 10 5 - 14 mmol/L    CO2 33.0 (H) 20.0 - 31.0 mmol/L    BUN 25 (H) 9 - 23 mg/dL    Creatinine 1.61 (H) 0.60 - 0.80 mg/dL    BUN/Creatinine Ratio 5     EGFR CKD-EPI Non-African American, Female 9 (L) >=60 mL/min/1.24m2    EGFR CKD-EPI African American, Female 10 (L) >=60 mL/min/1.7m2    Glucose 89 70 - 99 mg/dL    Calcium 9.4 8.7 - 09.6 mg/dL    Albumin 3.2 (L) 3.4 - 5.0 g/dL    Total Protein 8.3 (H) 5.7 - 8.2 g/dL    Total Bilirubin 0.4 0.3 - 1.2 mg/dL    AST 27 <=04 U/L    ALT 19 10 - 49 U/L    Alkaline Phosphatase 73 46 - 116 U/L   CBC w/ Differential    Collection Time: 05/14/20  8:35 AM   Result Value Ref Range    WBC 3.9 (L) 4.5 - 11.0 10*9/L    RBC 3.90 (L) 4.00 - 5.20 10*12/L    HGB 12.0 12.0 - 16.0 g/dL    HCT 54.0 98.1 - 19.1 %    MCV 95.8 80.0 - 100.0 fL    MCH 30.8 26.0 - 34.0 pg    MCHC 32.1 31.0 - 37.0 g/dL    RDW 47.8 29.5 - 62.1 %    MPV 9.2 7.0 - 10.0 fL    Platelet 182 150 - 440 10*9/L    Neutrophils % 57.0 %    Lymphocytes % 29.7 %    Monocytes % 7.9 %    Eosinophils % 2.6 %    Basophils % 0.4 %    Absolute Neutrophils 2.2 2.0 - 7.5 10*9/L    Absolute Lymphocytes 1.2 (L) 1.5 - 5.0 10*9/L    Absolute Monocytes 0.3 0.2 - 0.8 10*9/L    Absolute Eosinophils 0.1 0.0 - 0.4 10*9/L    Absolute Basophils 0.0 0.0 - 0.1 10*9/L    Large Unstained Cells 2 0 - 4 %    Macrocytosis Slight (A) Not Present    Hypochromasia Slight (A) Not Present         IMAGING Personally reviewed   CT Abdomen Pelvis Wo Contrast  Result Date: 05/14/2020  -Extensive aortobiiliac calcifications. Mild calcifications of the distal external iliac vasculature. Scattered calcifications of the common femoral arteries and internal iliac bilaterally.   -Sequelae of liver transplantation. Diffuse pneumobilia.   -Small atrophic native kidneys compatible with chronic medical renal disease. Nonobstructing left nephrolithiasis.     XR Chest 2 views  Result Date: 05/14/2020  No acute findings.    US Liver Transplant  Result Date: 05/14/2020  --Patent hepatic transplant vasculature.   --Resistive indices within the common hepatic arteries are stable to slightly increased compared to prior, within normal limits. Recommend attention on follow-up.   --Decreased phasicity of the hepatic veins and IVC, findings may be related to venoocclusive disease however favor technical issues.   --The transplant liver is mildly enlarged and demonstrates increased echogenicity.     _______________________________________________        Gertie Fey, DNP, APRN, FNP-C  [facility]  [address]  [place], [place]  [address] _______________________________________________        Gertie Fey, DNP, APRN, FNP-C  [facility]  [address]  [place], [place]  [address]

## 2020-05-14 ENCOUNTER — Ambulatory Visit: Admit: 2020-05-14 | Discharge: 2020-05-15 | Payer: MEDICARE

## 2020-05-14 ENCOUNTER — Ambulatory Visit: Admit: 2020-05-14 | Discharge: 2020-05-14 | Payer: MEDICARE

## 2020-05-14 DIAGNOSIS — Z944 Liver transplant status: Principal | ICD-10-CM

## 2020-05-14 DIAGNOSIS — Z79899 Other long term (current) drug therapy: Principal | ICD-10-CM

## 2020-05-14 DIAGNOSIS — Z23 Encounter for immunization: Principal | ICD-10-CM

## 2020-05-14 DIAGNOSIS — K769 Liver disease, unspecified: Principal | ICD-10-CM

## 2020-05-14 LAB — CBC W/ AUTO DIFF
BASOPHILS ABSOLUTE COUNT: 0 10*9/L (ref 0.0–0.1)
EOSINOPHILS ABSOLUTE COUNT: 0.1 10*9/L (ref 0.0–0.4)
EOSINOPHILS RELATIVE PERCENT: 2.6 %
HEMATOCRIT: 37.4 % (ref 36.0–46.0)
HEMOGLOBIN: 12 g/dL (ref 12.0–16.0)
LARGE UNSTAINED CELLS: 2 % (ref 0–4)
LYMPHOCYTES ABSOLUTE COUNT: 1.2 10*9/L — ABNORMAL LOW (ref 1.5–5.0)
LYMPHOCYTES RELATIVE PERCENT: 29.7 %
MEAN CORPUSCULAR HEMOGLOBIN CONC: 32.1 g/dL (ref 31.0–37.0)
MEAN CORPUSCULAR HEMOGLOBIN: 30.8 pg (ref 26.0–34.0)
MEAN CORPUSCULAR VOLUME: 95.8 fL (ref 80.0–100.0)
MEAN PLATELET VOLUME: 9.2 fL (ref 7.0–10.0)
MONOCYTES ABSOLUTE COUNT: 0.3 10*9/L (ref 0.2–0.8)
MONOCYTES RELATIVE PERCENT: 7.9 %
NEUTROPHILS ABSOLUTE COUNT: 2.2 10*9/L (ref 2.0–7.5)
PLATELET COUNT: 182 10*9/L (ref 150–440)
RED BLOOD CELL COUNT: 3.9 10*12/L — ABNORMAL LOW (ref 4.00–5.20)
RED CELL DISTRIBUTION WIDTH: 14.9 % (ref 12.0–15.0)
WBC ADJUSTED: 3.9 10*9/L — ABNORMAL LOW (ref 4.5–11.0)

## 2020-05-14 LAB — COMPREHENSIVE METABOLIC PANEL
ALBUMIN: 3.2 g/dL — ABNORMAL LOW (ref 3.4–5.0)
ALKALINE PHOSPHATASE: 73 U/L (ref 46–116)
ALT (SGPT): 19 U/L (ref 10–49)
ANION GAP: 10 mmol/L (ref 5–14)
AST (SGOT): 27 U/L (ref <=34)
BILIRUBIN TOTAL: 0.4 mg/dL (ref 0.3–1.2)
BLOOD UREA NITROGEN: 25 mg/dL — ABNORMAL HIGH (ref 9–23)
BUN / CREAT RATIO: 5
CHLORIDE: 96 mmol/L — ABNORMAL LOW (ref 98–107)
CO2: 33 mmol/L — ABNORMAL HIGH (ref 20.0–31.0)
CREATININE: 4.85 mg/dL — ABNORMAL HIGH
EGFR CKD-EPI AA FEMALE: 10 mL/min/{1.73_m2} — ABNORMAL LOW (ref >=60)
EGFR CKD-EPI NON-AA FEMALE: 9 mL/min/{1.73_m2} — ABNORMAL LOW (ref >=60)
GLUCOSE RANDOM: 89 mg/dL (ref 70–99)
POTASSIUM: 3.9 mmol/L (ref 3.4–4.5)
PROTEIN TOTAL: 8.3 g/dL — ABNORMAL HIGH (ref 5.7–8.2)
SODIUM: 139 mmol/L (ref 135–145)

## 2020-05-14 LAB — GAMMA GLUTAMYL TRANSFERASE: Gamma glutamyl transferase:CCnc:Pt:Ser/Plas:Qn:: 18

## 2020-05-14 LAB — CYCLOSPORINE, TROUGH: Lab: 229

## 2020-05-14 LAB — PHOSPHORUS: Phosphate:MCnc:Pt:Ser/Plas:Qn:: 7.5 — ABNORMAL HIGH

## 2020-05-14 LAB — NEUTROPHILS RELATIVE PERCENT: Neutrophils/100 leukocytes:NFr:Pt:Bld:Qn:Automated count: 57

## 2020-05-14 LAB — MAGNESIUM: Magnesium:MCnc:Pt:Ser/Plas:Qn:: 2.2

## 2020-05-14 LAB — CALCIUM: Calcium:MCnc:Pt:Ser/Plas:Qn:: 9.4

## 2020-05-14 LAB — BILIRUBIN DIRECT: Bilirubin.glucuronidated+Bilirubin.albumin bound:MCnc:Pt:Ser/Plas:Qn:: 0.2

## 2020-05-14 MED ORDER — CYCLOSPORINE MODIFIED 25 MG CAPSULE
ORAL_CAPSULE | Freq: Two times a day (BID) | ORAL | 3 refills | 90 days | Status: CP
Start: 2020-05-14 — End: 2021-05-14
  Filled 2020-05-18: qty 120, 30d supply, fill #0

## 2020-05-14 NOTE — Unmapped (Signed)
Patient seen with NP Martin-Velez.  Patient in clinic with her brother. Patient denies changes in pharmacy.  Patient with a fistula access.  Patient Denies use of alcohol, tobacco and controlled substances.      Patient currentlyon disability.        Patient educated 15 minutes Educated about wearing long sleves, hat, and using sunblock to prevent skin cancer. Educated to visit dermatologist yearly. Educated about how to identify possible skin cancer lesions. Educated about the increased risk of cancer due to immunosuppression medication and the benefits in routine cancer screenings.   Colonoscopy scheduled in August 2021  Due for Mammogram agreed to follow up with PCP about getting it scheduled locally.  Has false teeth so no need for dental work at this time.     Educated about the need for all routine health maintain to be up to date in order to proceed with any kidney transplant approval.        Vaccinations:  Patient reports having COVID-19 vaccination at local [facility] back in April,.  Shingrix vaccine has only has one vaccine.  Agreed to get second booster today.      Patient taking Generic Cyclosporine 21:00 05/13/2020.  Medication not taken before lab draw.  Switch manufacturers two weeks ago.  Sent e-script for transplant medications to preferred pharmacy.  Reviewed lab and reordered standing transplant labs.  See Providers note for further detail.

## 2020-05-14 NOTE — Unmapped (Signed)
[facility] orders.

## 2020-05-15 NOTE — Unmapped (Signed)
Per primary coordinator notes 7/22 CSA level is reflective of a true trough level - forwarded result to NP Martin-Velez who given recent stability orders no changes at this time will follow repeat labs.

## 2020-05-18 MED FILL — CYCLOSPORINE MODIFIED 25 MG CAPSULE: 30 days supply | Qty: 120 | Fill #0 | Status: AC

## 2020-05-18 NOTE — Unmapped (Signed)
7.26-2nd attempt, unable to lvm

## 2020-05-21 NOTE — Unmapped (Signed)
7.29-3rd attempt, mail box is full, unable to lvm, utc sent routed back to tnc

## 2020-05-22 NOTE — Unmapped (Signed)
Confirmed with patient her blood type is O negative.

## 2020-05-29 NOTE — Unmapped (Signed)
Received VM from patient stating she received a letter of no contact.  LM for return call.      CT abd/pel sent to Dr. Matilde Haymaker for review today as step 1 per Dr. Nestor Lewandowsky.

## 2020-06-04 NOTE — Unmapped (Signed)
Doyce Loose, MD  Durenda Age, RN  [facility]y       ??   Previous Messages    ??  ----- Message -----   From: Durenda Age, RN   Sent: 05/29/2020 ?? 3:46 PM EDT   To: Doyce Loose, MD     Post liver--Pre Kidney.   05/14/2020 CT for targets and follow up.     64 yo female, BMI 22.3. ??HD since 2019 with O blood group   ??Per Dr. Tod Persia note:   1. Transplant Recipient Evaluation - Patient is an acceptable but high risk candidate.   ??   - Lack of confirmed social support   - Vascular calcifications on last CT scan in 2019   - Recent falls - ambulates with cane occasionally can do her daily activities   - Chronic leucopenia   ??   We need to start with CT abdomen and pelvis first, once cleared with surgeons,

## 2020-06-05 MED ORDER — CELECOXIB 100 MG CAPSULE
0.00000 days
Start: 2020-06-05 — End: ?

## 2020-06-08 DIAGNOSIS — Z79899 Other long term (current) drug therapy: Principal | ICD-10-CM

## 2020-06-08 DIAGNOSIS — K769 Liver disease, unspecified: Principal | ICD-10-CM

## 2020-06-08 DIAGNOSIS — Z944 Liver transplant status: Principal | ICD-10-CM

## 2020-06-09 DIAGNOSIS — Z01818 Encounter for other preprocedural examination: Principal | ICD-10-CM

## 2020-06-09 DIAGNOSIS — N186 End stage renal disease: Principal | ICD-10-CM

## 2020-06-09 DIAGNOSIS — Z01810 Encounter for preprocedural cardiovascular examination: Principal | ICD-10-CM

## 2020-06-09 NOTE — Unmapped (Signed)
8.16-Pt called back due to utc letter to sched  Appts, pt mention thursdays works best for appointments req to have the mornings for appotinments 8 am or 7 am  **pt partially scheduled due to no thursdays avail in sept or august ,   October template is not up yet for that month. Pt sched for in person testing on 9.23, letter sent

## 2020-06-18 ENCOUNTER — Ambulatory Visit: Payer: Medicaid Other | Admitting: Family Medicine

## 2020-06-18 NOTE — Progress Notes (Deleted)
PATIENT: Tami [name] DOB: 03-15-1956  REASON FOR VISIT: follow up HISTORY FROM: patient  No chief complaint on file.    HISTORY OF PRESENT ILLNESS: Today 06/18/20 Tami [name] is a 64 y.o. female here today for follow up for seizures. She continues levetiracetam 500mg  daily with extra evening dose on M/W/F. She continues dialysis. She is followed closely by nephrology and PCP.    HISTORY: (copied from Dr Gladstone Lighter note on 06/18/2019)  UPDATE (06/18/19, VRP): Since last visit, doing well. Symptoms are stable. No seizures. No alleviating or aggravating factors. Tolerating levetiracetam.    PRIOR HPI (12/17/18): 64 year old female here for evaluation of seizures.  History of liver transplant, hypertension, end-stage renal disease on hemodialysis.  10/03/2018 patient was at office visit, about to give blood when all of a sudden she became unresponsive and had some jerking movements.  Staff could not feel a pulse and started CPR.  Patient was immediately attended to by the rapid response team.  She was taken downstairs to the emergency room.  She may have had some type of gaze deviation initially resolved spontaneously.  While in the emergency room she had a second event with tonic-clonic seizures.  She was given IV Ativan and treated with Keppra.  She was admitted for further evaluation.  MRI, MRA of the head were obtained which showed no acute findings.  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  EEG was unremarkable.  Patient was discharged on no antiseizure medication.  Since that time patient is doing well.  No further events.   REVIEW OF SYSTEMS: Out of a complete 14 system review of symptoms, the patient complains only of the following symptoms, and all other reviewed systems are negative.  ALLERGIES: Allergies  Allergen Reactions   Iodinated Diagnostic Agents Other (See Comments)    UNSPECIFIED REACTION    Ioxaglate      UNSPECIFIED REACTION    Latex     UNSPECIFIED REACTION    Quelicin  [Succinylcholine Chloride] Other (See Comments)    HOME MEDICATIONS: Outpatient Medications Prior to Visit  Medication Sig Dispense Refill   acetaminophen (MAPAP) 325 MG tablet Take by mouth.     albuterol (PROVENTIL HFA;VENTOLIN HFA) 108 (90 Base) MCG/ACT inhaler Inhale 2 puffs into the lungs every 6 (six) hours as needed.      allopurinol (ZYLOPRIM) 100 MG tablet Take 100 mg by mouth daily.  5   amLODipine (NORVASC) 5 MG tablet Take 5 mg by mouth daily.     AURYXIA 1 GM 210 MG(Fe) tablet TAKE 2 TABLETS BY MOUTH THREE TIMES A DAY WITH MEALS.   SWALLOW WHOLE, DO NOT CHEW OR CRUSH MEDICATION   AND 1 TABLET WITH A SNACK     B Complex-C-Zn-Folic Acid (DIALYVITE 818-HUDJ 15) 0.8 MG TABS Take by mouth.     cycloSPORINE modified (NEORAL) 25 MG capsule Take 50 mg by mouth 2 (two) times daily.      ENSURE (ENSURE) Take 1 Can by mouth daily.      fluticasone (FLONASE ALLERGY RELIEF) 50 MCG/ACT nasal spray Place into the nose.     folic acid (FOLVITE) 1 MG tablet Take 1 mg by mouth daily.     gabapentin (NEURONTIN) 100 MG capsule TAKE 2 CAPSULE BY MOUTH TWICE A DAY AS NEEDED TAKE FOR PAIN     gabapentin (NEURONTIN) 300 MG capsule Take 300 mg by mouth 2 (two) times daily.     heparin 1000  UNIT/ML injection Heparin Sodium (Porcine) 1,000 Units/mL Systemic     labetalol (NORMODYNE) 100 MG tablet Take 100 mg by mouth 2 (two) times daily.     levETIRAcetam (KEPPRA) 500 MG tablet Take 1 tablet (500 mg total) by mouth daily AND 1 tablet (500 mg total) every Monday, Wednesday, and Friday at 6 PM. 50 tablet 12   lidocaine-prilocaine (EMLA) cream APPLY SMALL AMOUNT TO ACCESS SITE (AVF) 30 - 60 MINUTES BEFORE DIALYSIS. COVER WITH OCCLUSIVE DRESSING ( SARAN WRAP)     lidocaine-prilocaine (EMLA) cream emla  cream     loratadine (CLARITIN) 10 MG tablet Take 10 mg by mouth daily. X 10 days     metoprolol succinate (TOPROL-XL)  50 MG 24 hr tablet 1 (ONE) TABLET TABLET ER 24HR DAILY     ondansetron (ZOFRAN) 4 MG tablet Take 4 mg by mouth every 6 (six) hours as needed for nausea or vomiting.     Oxycodone HCl 10 MG TABS Take 1 tablet (10 mg total) by mouth every 6 (six) hours as needed for up to 10 doses (severe pain). - hold for sedation (Patient taking differently: Take 10 mg by mouth every 4 (four) hours as needed (severe pain). - hold for sedation) 10 tablet 0   Pancrelipase, Lip-Prot-Amyl, 24000-76000 units CPEP Pancrelipase 24000-76000     pantoprazole (PROTONIX) 40 MG tablet Take by mouth.     PARoxetine (PAXIL) 10 MG tablet Take by mouth.     promethazine (PHENERGAN) 12.5 MG tablet Take 12.5 mg by mouth daily.     RENAGEL 800 MG tablet TAKE 2 TABLETS BY MOUTH THREE TIMES DAILY WITH MEALS     Vitamin D, Ergocalciferol, (DRISDOL) 1.25 MG (50000 UT) CAPS capsule Take 50,000 Units by mouth once a week. Sunday     No facility-administered medications prior to visit.    PAST MEDICAL HISTORY: Past Medical History:  Diagnosis Date   Anemia    Arthritis    ESRD (end stage renal disease) on dialysis (HCC)    "MWF; [place]" (10/04/2018)   Gout    Hepatitis, autoimmune (HCC) 12/08/2011   Hypertension    Seizures (HCC) 10/03/2018    PAST SURGICAL HISTORY: Past Surgical History:  Procedure Laterality Date   ABDOMINAL HYSTERECTOMY     AV FISTULA PLACEMENT Left 10/02/2018   Procedure: Creation of left arm Brachiocephalic Fistula;  Surgeon: Cain, Brandon Christopher, MD;  Location: [facility] OR;  Service: Vascular;  Laterality: Left;   FISTULA SUPERFICIALIZATION Left 11/29/2018   Procedure: TRANSLOCATION/FISTULA SUPERFICIALIZATION OF LEFT ARM FISTULA;  Surgeon: Clark, Christopher J, MD;  Location: [facility] OR;  Service: Vascular;  Laterality: Left;   LIVER TRANSPLANT     [age]   TONSILLECTOMY      FAMILY HISTORY: Family History  Problem Relation Age of Onset   Stroke Mother    Cancer Father    Lupus  Sister        1 sister   Hypertension Sister    Cancer Brother        prostate- 1 brother   Lupus Brother     SOCIAL HISTORY: Social History   Socioeconomic History   Marital status: Divorced    Spouse name: Not on file   Number of children: 2   Years of education: some college   Highest education level: Not on file  Occupational History    Comment: na  Tobacco Use   Smoking status: Never Smoker   Smokeless tobacco: Never Used   Tobacco comment: never  used tobacco  Vaping Use   Vaping Use: Never used  Substance and Sexual Activity   Alcohol use: Not Currently    Alcohol/week: 0.0 standard drinks   Drug use: No   Sexual activity: Not on file  Other Topics Concern   Not on file  Social History Narrative   Lives with son   Caffeine - tea , little    Social Determinants of Health   Financial Resource Strain:    Difficulty of Paying Living Expenses: Not on file  Food Insecurity:    Worried About Charity fundraiser in the Last Year: Not on file   [employer] of Food in the Last Year: Not on file  Transportation Needs:    Lack of Transportation (Medical): Not on file   Lack of Transportation (Non-Medical): Not on file  Physical Activity:    Days of Exercise per Week: Not on file   Minutes of Exercise per Session: Not on file  Stress:    Feeling of Stress : Not on file  Social Connections:    Frequency of Communication with Friends and Family: Not on file   Frequency of Social Gatherings with Friends and Family: Not on file   Attends Religious Services: Not on file   Active Member of Clubs or Organizations: Not on file   Attends Archivist Meetings: Not on file   Marital Status: Not on file  Intimate Partner Violence:    Fear of Current or Ex-Partner: Not on file   Emotionally Abused: Not on file   Physically Abused: Not on file   Sexually Abused: Not on file      PHYSICAL EXAM  There were no vitals filed for this  visit. There is no height or weight on file to calculate BMI.  Generalized: Well developed, in no acute distress  Cardiology: normal rate and rhythm, no murmur noted Respiratory: clear to auscultation bilaterally  Neurological examination  Mentation: Alert oriented to time, place, history taking. Follows all commands speech and language fluent Cranial nerve II-XII: Pupils were equal round reactive to light. Extraocular movements were full, visual field were full on confrontational test. Facial sensation and strength were normal. Uvula tongue midline. Head turning and shoulder shrug  were normal and symmetric. Motor: The motor testing reveals 5 over 5 strength of all 4 extremities. Good symmetric motor tone is noted throughout.  Sensory: Sensory testing is intact to soft touch on all 4 extremities. No evidence of extinction is noted.  Coordination: Cerebellar testing reveals good finger-nose-finger and heel-to-shin bilaterally.  Gait and station: Gait is normal. Tandem gait is normal. Romberg is negative. No drift is seen.  Reflexes: Deep tendon reflexes are symmetric and normal bilaterally.   DIAGNOSTIC DATA (LABS, IMAGING, TESTING) - I reviewed patient records, labs, notes, testing and imaging myself where available.  No flowsheet data found.   Lab Results  Component Value Date   WBC 4.5 02/20/2020   HGB 10.5 (L) 02/20/2020   HCT 32.8 (L) 02/20/2020   MCV 101.2 (H) 02/20/2020   PLT 106 (L) 02/20/2020      Component Value Date/Time   NA 137 02/20/2020 1411   NA 137 08/10/2018 0000   NA 140 10/13/2017 1106   NA 138 04/11/2017 1023   K 2.8 (LL) 02/20/2020 1411   K 5.3 no visable hemolysis (H) 10/13/2017 1106   K 5.2 No visable hemolysis (H) 04/11/2017 1023   CL 92 (L) 02/20/2020 1411   CL 104 10/13/2017 1106  CO2 32 02/20/2020 1411   CO2 17 (L) 10/13/2017 1106   CO2 19 (L) 04/11/2017 1023   GLUCOSE 116 (H) 02/20/2020 1411   GLUCOSE 116 10/13/2017 1106   BUN 13 02/20/2020  1411   BUN 1 (A) 08/10/2018 0000   BUN 58 (H) 10/13/2017 1106   BUN 33.4 (H) 04/11/2017 1023   CREATININE 4.00 (HH) 02/20/2020 1411   CREATININE 5.9 (HH) 10/13/2017 1106   CREATININE 1.8 (H) 04/11/2017 1023   CALCIUM 9.3 02/20/2020 1411   CALCIUM 8.4 10/13/2017 1106   CALCIUM 9.0 04/11/2017 1023   PROT 7.9 02/20/2020 1411   PROT 7.7 10/13/2017 1106   PROT 8.8 (H) 04/11/2017 1023   ALBUMIN 3.6 02/20/2020 1411   ALBUMIN 3.3 10/13/2017 1106   ALBUMIN 3.7 04/11/2017 1023   AST 15 02/20/2020 1411   AST 16 04/11/2017 1023   ALT 5 02/20/2020 1411   ALT 15 10/13/2017 1106   ALT 8 04/11/2017 1023   ALKPHOS 71 02/20/2020 1411   ALKPHOS 68 10/13/2017 1106   ALKPHOS 80 04/11/2017 1023   BILITOT 0.5 02/20/2020 1411   BILITOT 0.54 04/11/2017 1023   GFRNONAA 11 (L) 02/20/2020 1411   GFRAA 13 (L) 02/20/2020 1411   Lab Results  Component Value Date   CHOL 124 10/03/2018   HDL 23 (L) 10/03/2018   LDLCALC 73 10/03/2018   TRIG 138 10/03/2018   CHOLHDL 5.4 10/03/2018   No results found for: HGBA1C Lab Results  Component Value Date   VITAMINB12 301 10/03/2018   Lab Results  Component Value Date   TSH 0.831 10/03/2018       ASSESSMENT AND PLAN 64 y.o. year old female  has a past medical history of Anemia, Arthritis, ESRD (end stage renal disease) on dialysis ([place]), Gout, Hepatitis, autoimmune ([place]) (12/08/2011), Hypertension, and Seizures ([place]) (10/03/2018). here with   No diagnosis found.   We will continue levetiracetam 500mg  daily and extra dose of 500mg  on Monday, Wednesday and Friday evenings after dialysis. Seizure precautions reviewed.    No orders of the defined types were placed in this encounter.    No orders of the defined types were placed in this encounter.     I spent 15 minutes with the patient. 50% of this time was spent counseling and educating patient on plan of care and medications.    Debbora Presto, FNP-C 06/18/2020, 10:03 AM [facility] [address], [place], [place] [address] [phone]

## 2020-06-22 NOTE — Unmapped (Signed)
[facility] Refill Coordination Note    Specialty Medication(s) to be Shipped:   Transplant: cyclosporine 25mg     Other medication(s) to be shipped: No additional medications requested for fill at this time     Liller Yohn, DOB: 01/13/1956  Phone: [phone] (home) [phone] (work)      All above HIPAA information was verified with patient.     Was a Nurse, learning disability used for this call? No    Completed refill call assessment today to schedule patient's medication shipment from the [facility] [phone]).       Specialty medication(s) and dose(s) confirmed: Regimen is correct and unchanged.   Changes to medications: Aiesha reports no changes at this time.  Changes to insurance: No  Questions for the pharmacist: No    Confirmed patient received Welcome Packet with first shipment. The patient will receive a drug information handout for each medication shipped and additional FDA Medication Guides as required.       DISEASE/MEDICATION-SPECIFIC INFORMATION        N/A    SPECIALTY MEDICATION ADHERENCE     Medication Adherence    Patient reported X missed doses in the last month: 0  Specialty Medication: cyclosporine  Patient is on additional specialty medications: No  Patient is on more than two specialty medications: No  Any gaps in refill history greater than 2 weeks in the last 3 months: no  Demonstrates understanding of importance of adherence: yes  Informant: patient                Cyclosporine 25mg : Patient has 7 days of medication on hand      SHIPPING     Shipping address confirmed in Epic.     Delivery Scheduled: Yes, Expected medication delivery date: 9/2.     Medication will be delivered via UPS to the prescription address in Epic WAM.    Olga Millers   [facility] Specialty Technician

## 2020-06-24 MED FILL — CYCLOSPORINE MODIFIED 25 MG CAPSULE: ORAL | 30 days supply | Qty: 120 | Fill #1

## 2020-06-24 MED FILL — CYCLOSPORINE MODIFIED 25 MG CAPSULE: 30 days supply | Qty: 120 | Fill #1 | Status: AC

## 2020-07-03 NOTE — Unmapped (Signed)
Pt sched 10/28

## 2020-07-06 DIAGNOSIS — K769 Liver disease, unspecified: Principal | ICD-10-CM

## 2020-07-06 DIAGNOSIS — Z944 Liver transplant status: Principal | ICD-10-CM

## 2020-07-06 DIAGNOSIS — Z79899 Other long term (current) drug therapy: Principal | ICD-10-CM

## 2020-07-16 ENCOUNTER — Ambulatory Visit
Admit: 2020-07-16 | Discharge: 2020-07-16 | Payer: MEDICARE | Attending: Student in an Organized Health Care Education/Training Program | Primary: Student in an Organized Health Care Education/Training Program

## 2020-07-16 ENCOUNTER — Ambulatory Visit: Admit: 2020-07-16 | Discharge: 2020-07-29 | Payer: MEDICARE

## 2020-07-16 ENCOUNTER — Ambulatory Visit: Admit: 2020-07-16 | Discharge: 2020-07-18 | Payer: MEDICARE

## 2020-07-16 ENCOUNTER — Ambulatory Visit: Admit: 2020-07-16 | Discharge: 2020-07-16 | Payer: MEDICARE | Attending: Surgery | Primary: Surgery

## 2020-07-16 ENCOUNTER — Ambulatory Visit: Admit: 2020-07-16 | Discharge: 2020-07-16 | Payer: MEDICARE

## 2020-07-16 DIAGNOSIS — Z01818 Encounter for other preprocedural examination: Principal | ICD-10-CM

## 2020-07-16 DIAGNOSIS — Z01810 Encounter for preprocedural cardiovascular examination: Principal | ICD-10-CM

## 2020-07-16 DIAGNOSIS — N186 End stage renal disease: Principal | ICD-10-CM

## 2020-07-16 MED ADMIN — regadenoson (LEXISCAN) injection: .4 mg | INTRAVENOUS | @ 17:00:00 | Stop: 2020-07-16

## 2020-07-16 MED ADMIN — Tc-99m Sestamibi (Cardiolite): 9.8 | INTRAVENOUS | @ 17:00:00 | Stop: 2020-07-16

## 2020-07-16 NOTE — Unmapped (Signed)
Transplant Surgery History and Physical    Assessment/Recommendations:  Katherine Norton is a 64 y.o. female seen in consultation at the request of Griffin Dakin, * for evaluation for candidacy for renal transplantation.     I spent 30 minutes with the patient obtaining the above history and physical examination, and greater than 50% of the time was spent on counseling and the substance of the discussion.    Today we discussed renal transplantation going over the surgery, the hospital course including length of stay, anti-rejection medications and their side effects, results and the cadaveric donor system and living donor options.    In regards to the surgical procedure, this is a major operation performed under general anesthesia with the risks of heart attack, stroke and death.  Multiple invasive means of monitoring may be necessary during the operation including an arterial line, a central venous catheter, a foley catheter inserted into the bladder, and a tube from your nose into your stomach to prevent stomach distension. After surgery, the patient usually goes to a monitored unit and is then sent to the regular ward. The incisional area is subject to the risk of infection and hernia and this risk is increased by obesity, diabetes and the immunosuppressive medications. Numbness can occur over the area of any surgical incision. The less common surgical complications (less than 5%) include blood clots in the legs or pelvis that may travel to the lungs, blood vessel infection or clotting, bleeding, urinary leaks, and lymphocele. Urgent/emergent re-operation may be required after a transplant for any of the above complications. One important point following surgery is that the hospital course can be prolonged, and dialysis may be necessary for some time, if the kidney does not function immediately (ATN). Less than 5% of the time, a transplanted kidney may never function or may function for only a short period of time.    Anti-rejection medications, including FK506 (Prograf) or Cyclosporine (Neoral), Cellcept, steroids and others, will be needed as long as the kidney is viable. Problems include infection, cancer, hirsutism, tremors, gum swelling, hypertension, bone fractures, aggravation of diabetes or new onset diabetes, cataracts, and rashes.    The donor system was reviewed. Although all donors are tested for all known infections, there is a persisting chance of transmission of viruses or other infections as well as possible transmission of tumors. We reviewed the advantages and disadvantages of living and deceased donor renal transplantation. We also reviewed deceased donation after cardiac death and extended criteria donors. We also discussed crossmatch tests and their inability to predict some delayed antibody-mediated rejection episodes.    Finally, I reviewed with the patient how the surgery is expected to improve their health and quality of life, that the average length of hospitalization stay is 3-5 days, and that the length of their expected recovery period, including when normal daily activities may be resumed, will be patient dependent.    [name]  had all questions answered and was urged to call us at any time if additional questions should arise.    This patient was seen and evaluated with Dr. Doyne Keel and will likely be an acceptable candidate but needs all forming cardiac testing completed and given her history of asthma with regular inhaler use, we recommend obtaining PFTs and a formal Pulmonology consult. Prior to placing on list, she will need a formal [place] evaluation as well.      HPI:  Katherine Norton is a 64 y.o. female who presents for evaluation  for kidney transplant. 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 She now does HD through a LUE AVF on MWF and has been doing so without issue. She is having regular bowel function with 3-4 bowel movements per week which is normal for her and she stopped making urine about one year ago. She denies any history of UTI or kidney stones in the past. She takes her inhaler about 1-2 times per month for her asthma. Abdominal surgeries include a hysterectomy, cesarean section, and her liver transplant.    Of note, she recently underwent a colonoscopy and she was noted to have a hemorrhoid or some type of lesion which is being removed by Dr. Rubye Oaks at [facility] on 10/15. She felt that this was a hemorrhoid but was unresponsive to creams and has remained itchy and painful which is why [facility] has elected to have it excised.    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 acetaminophen (TYLENOL) 325 MG tablet 2 tablets (650 mg total) by G-tube route every six (6) hours as needed. (Patient taking differently: Take 650 mg by mouth every six (6) hours as needed (headaches). )  0   ??? albuterol 2.5 mg/0.5 mL nebulizer solution Inhale 0.5 mL (2.5 mg total) by nebulization every six (6) hours as needed for wheezing or shortness of breath. 30 each 12   ??? amLODIPine (NORVASC) 5 MG tablet Take 5 mg by mouth daily.     ??? calcitonin, salmon, (MIACALCIN) 200 unit/actuation nasal spray 1 spray by Alternating Nares route once daily.      ??? chlorhexidine (PERIDEX) 0.12 % solution 5 mL by Mouth route two (2) times a day. 1 Bottle 0   ??? cycloSPORINE modified (NEORAL) 25 MG capsule Take 2 capsules (50 mg total) by mouth Two (2) times a day. 360 capsule 3   ??? dextrose (D10W) 10% Soln bolus Infuse 125 mL into a venous catheter every thirty (30) minutes as needed.  0   ??? dextrose 5 % and sodium chloride 0.9 % (D5-NS) infu Infuse 25 mL/hr into a venous catheter continuous as needed (if tube feed held or NPO).  0   ??? famotidine (PEPCID) 20 MG tablet 1 tablet (20 mg total) by G-tube route daily. (Patient taking differently: 20 mg daily. ) 30 tablet 0   ??? gentamicin 1 mg/mL, sodium citrate 4% Gent locks with dialysis 2.4 mL 4   ??? heparin sodium,porcine (HEPARIN, PORCINE,) 1,000 unit/mL 1000 unit/mL injection This heparin is for dialysis use ONLY. RN if you are unable to aspirate from both lumes, call provider piror to using the catheter. 1 mL 0   ??? heparin sodium,porcine (HEPARIN,  PORCINE,) 5,000 unit/mL injection Inject 1 mL (5,000 Units total) under the skin every eight (8) hours. 1 mL 0   ??? insulin lispro (HUMALOG) 100 unit/mL injection Inject 0-0.12 mL (0-12 Units total) under the skin Every six (6) hours. (Patient taking differently: Inject 0-12 Units under the skin Three (3) times a day before meals. ) 1440 Units 0   ??? melatonin 3 mg Tab Take 1 tablet (3 mg total) by mouth nightly.  0   ??? oxyCODONE (ROXICODONE) 5 MG immediate release tablet 1 tablet (5 mg total) by G-tube route every six (6) hours as needed. for up to 5 days (Patient taking differently: Take 5 mg by mouth every six (6) hours as needed. ) 10 tablet 0     No current facility-administered medications for this visit.         Past Medical History  Past Medical History:   Diagnosis Date   ??? Chronic kidney disease    ??? Cirrhosis (CMS-HCC)    ??? HTN (hypertension)          Past Surgical History  Past Surgical History:   Procedure Laterality Date   ??? BRONCHOSCOPY 06/03/2018        ??? HYSTERECTOMY     ??? 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 PR TRACHEOSTOMY, PLANNED Midline 06/26/2018    Procedure: TRACHEOSTOMY PLANNED (SEPART PROC);  Surgeon: Theodosia Blender, MD;  Location: MAIN [facility];  Service: ENT   ??? TONSILLECTOMY           Family History  The patient's family history includes Hypertension in her father and mother; Lupus in her brother and sister; Prostate cancer in her father..      Social History:  Tobacco use: denies  Alcohol use: denies  Drug use: denies      Review of Systems  A 12 system review of systems was negative except as noted in HPI    PE: Blood pressure 96/67, pulse 81, temperature 36.7 ??C, temperature source Tympanic, height 152.4 cm (5'), weight 52.2 kg (115 lb). Body mass index is 22.46 kg/m??.  General: older female sitting in chair in no acute distress  Lungs: normal work of breathing on room air, no audible wheezing   Heart: euvolemic, regular rate and rhythm  Abd: soft, non-tender, non-distended. Well healed Pfannenstiel and Mercedes incisions with some loose skin.  Pulses: Palpable bilateral femoral pulses  Skin: no rashes, jaundice or skin lesions noted  Ext: no edema, well perfused  Neuro: no focal deficits    Test Results  Lab Results   Component Value Date    WBC 3.9 (L) 05/14/2020    HGB 12.0 05/14/2020    HCT 37.4 05/14/2020    PLT 182 05/14/2020     Lab Results   Component Value Date    NA 139 05/14/2020    K 3.9 05/14/2020    CL 96 (L) 05/14/2020    CO2 33.0 (H) 05/14/2020    BUN 25 (H) 05/14/2020    CREATININE 4.85 (H) 05/14/2020    CALCIUM 9.4 05/14/2020    MG 2.2 05/14/2020    PHOS 7.5 (H) 05/14/2020     Lab Results   Component Value Date    ALKPHOS 73 05/14/2020    BILITOT 0.4 05/14/2020    BILIDIR 0.20 05/14/2020    PROT 8.3 (H) 05/14/2020    ALBUMIN 3.2 (L) 05/14/2020    ALT 19 05/14/2020    AST 27 05/14/2020  GGT 18 05/14/2020     Lab Results   Component Value Date    INR 1.03 03/19/2020    APTT 30.3 03/19/2020 Imaging:   - CXR 7/22: no acute findings    - CT AP w/o contrast 7/22: Extensive aortobiiliac calcifications. Mild calcifications of the distal external iliac vasculature. Scattered calcifications of the common femoral arteries and internal iliac bilaterally. Sequelae of liver transplantation. Diffuse pneumobilia. Small atrophic native kidneys compatible with chronic medical renal disease. Nonobstructing left nephrolithiasis.    - Liver Transplant Ultrasound 7/22: Patent hepatic transplant vasculature. Resistive indices within the common hepatic arteries are stable to slightly increased compared to prior, within normal limits. Recommend attention on follow-up. Decreased phasicity of the hepatic veins and IVC, findings may be related to venoocclusive disease however favor technical issues. The transplant liver is mildly enlarged and demonstrates increased echogenicity. Findings can be seen in the setting of hepatic steatosis/chronic liver disease.     - Echo and Myocardial Perfusion Study pending

## 2020-07-23 NOTE — Unmapped (Signed)
[facility] Refill Coordination Note    Specialty Medication(s) to be Shipped:   Transplant: cyclosporine 25mg     Other medication(s) to be shipped: No additional medications requested for fill at this time     Atheena Spano, DOB: 1956-07-29  Phone: There are no phone numbers on file.      All above HIPAA information was verified with patient.     Was a Nurse, learning disability used for this call? No    Completed refill call assessment today to schedule patient's medication shipment from the [facility] [phone]).       Specialty medication(s) and dose(s) confirmed: Regimen is correct and unchanged.   Changes to medications: Wandra reports no changes at this time.  Changes to insurance: No  Questions for the pharmacist: No    Confirmed patient received Welcome Packet with first shipment. The patient will receive a drug information handout for each medication shipped and additional FDA Medication Guides as required.       DISEASE/MEDICATION-SPECIFIC INFORMATION        N/A    SPECIALTY MEDICATION ADHERENCE     Medication Adherence    Patient reported X missed doses in the last month: 0  Specialty Medication: cyclosporine  Patient is on additional specialty medications: No  Patient is on more than two specialty medications: No  Any gaps in refill history greater than 2 weeks in the last 3 months: no  Demonstrates understanding of importance of adherence: yes  Informant: patient                Cyclosporine 25mg : Patient has 14 days of medication on hand       SHIPPING     Shipping address confirmed in Epic.     Delivery Scheduled: Yes, Expected medication delivery date: 10/5.     Medication will be delivered via UPS to the prescription address in Epic W[facility].    Olga Millers   [facility] Specialty Technician

## 2020-07-29 MED FILL — CYCLOSPORINE MODIFIED 25 MG CAPSULE: ORAL | 30 days supply | Qty: 120 | Fill #2

## 2020-07-29 MED FILL — CYCLOSPORINE MODIFIED 25 MG CAPSULE: 30 days supply | Qty: 120 | Fill #2 | Status: AC

## 2020-07-29 NOTE — Unmapped (Signed)
[facility] Pharmacist Intervention    Type of intervention: Change of MFG on NTI drug    Medication: cyclosporine 25mg     Problem: Apotex mfg of cyclosporine is not available and we need to change to teva mfg.    Intervention: Spoke to Ms. Egelhoff about having to change the generic mfg on cyclosporine from apotex to teva.  Told her to give the clinic a call to schedule labs when she starts the new mfg Pt acknowledged understanding.      Follow up needed: Set up for clinical call next month to see how new mfg is working.    Approximate time spent: 10 minutes    Tera Helper   [facility] Specialty Pharmacist

## 2020-07-29 NOTE — Unmapped (Signed)
Spoke to Ms. Rossetti about having to change the generic mfg on cyclosporine from apotex to teva.  Told her to give the clinic a call to schedule labs when she starts the new mfg.  Pt acknowledged understanding.

## 2020-07-31 NOTE — Unmapped (Signed)
Received notification from [facility] that pt will be switching to a different CsA manufacturer. Called pt and left vm requesting labs 2 weeks after starting new medication. Left this coordinator's contact information since it is the 1st interaction since previous coordinator left. Asked pt to call with any questions.

## 2020-08-03 DIAGNOSIS — Z944 Liver transplant status: Principal | ICD-10-CM

## 2020-08-03 DIAGNOSIS — Z79899 Other long term (current) drug therapy: Principal | ICD-10-CM

## 2020-08-03 DIAGNOSIS — K769 Liver disease, unspecified: Principal | ICD-10-CM

## 2020-08-06 ENCOUNTER — Telehealth: Payer: Self-pay | Admitting: Family Medicine

## 2020-08-06 MED ORDER — LEVETIRACETAM 500 MG PO TABS: ORAL_TABLET | ORAL | 0 refills | Status: DC

## 2020-08-06 NOTE — Telephone Encounter (Signed)
Tami [name], Pt called but couldn't get through so she came into the lobby. She has an appt with Amy on 11/30 but she needs a refill for Levetiracetam 500mg . She has 3 pills left. The bottle reads 1 refill by 06/17/20 and she missed the date. Thank you

## 2020-08-06 NOTE — Addendum Note (Signed)
Addended by: Darleen Crocker on: 08/06/2020 12:07 PM   Modules accepted: Orders

## 2020-08-06 NOTE — Telephone Encounter (Signed)
Refill sent to the pharmacy on file for the patient to hold her over until follow up visit

## 2020-08-10 DIAGNOSIS — Z01818 Encounter for other preprocedural examination: Principal | ICD-10-CM

## 2020-08-10 DIAGNOSIS — N186 End stage renal disease: Principal | ICD-10-CM

## 2020-08-10 DIAGNOSIS — Z01810 Encounter for preprocedural cardiovascular examination: Principal | ICD-10-CM

## 2020-08-10 NOTE — Unmapped (Signed)
Called patient to discuss NM stress test and the need for a PET stress test.  She stated that she does dialysis MWF, which I confirmed is what we have in [place].  She was able to have all of her questions answered today and voiced understanding for the need for a PET stress.

## 2020-08-10 NOTE — Unmapped (Signed)
-----   Message from Madaline Savage, MD sent at 08/06/2020  9:22 AM EDT -----  Non-diagnostic. Will need a PET stress test    ----- Message -----  From: Durenda Age, RN  Sent: 08/05/2020  10:36 PM EDT  To: Madaline Savage, MD    Please see below.    Katherine Norton  ----- Message -----  From: Griffin Dakin, MD  Sent: 08/04/2020  12:31 PM EDT  To: Durenda Age, RN    nm stress test equivocal. I recommend review with dr. Jon Billings re: ?should we follow up with pet stress vs left heart catheterization for perioperative risk assessment.  ----- Message -----  From: Darrol Poke, MD  Sent: 07/16/2020   4:53 PM EDT  To: Griffin Dakin, MD

## 2020-08-19 ENCOUNTER — Encounter: Payer: Self-pay | Admitting: Nephrology

## 2020-08-19 MED ORDER — LEVETIRACETAM 500 MG/100 ML IN SODIUM CHLORIDE (ISO-OSM) IV PIGGYBACK
0 days
Start: 2020-08-19 — End: ?

## 2020-08-20 ENCOUNTER — Institutional Professional Consult (permissible substitution): Admit: 2020-08-20 | Discharge: 2020-08-21 | Payer: MEDICARE

## 2020-08-20 NOTE — Unmapped (Signed)
Clinical Social Worker Progress Note    Name:Katherine Norton  Date of Birth:09/05/1956  UJW:[id number]    RE: RESCHEDULE      Noted that patient had appointment scheduled today at 8:30am.  Called pt at appt time as scheduled.  Pt answered the phone, but was unprepared for call.  Stated that she was at the doctor's office... but could talk for a few minutes.  Pt seemed surprised after the purpose and duration of this visit was explained.  Stated that her CG was not with her at this time.      At this time, this CSW will be unable to see patient due to pt location and lack of CG.      Patient will need to be rescheduled at this time.  TNC/Megan Zink notified of the above.      [name], LCSW, CCTSW  Transplant Case Manager  [facility]

## 2020-08-21 LAB — MAGNESIUM: MAGNESIUM: 2.2 mg/dL (ref 1.6–2.3)

## 2020-08-21 LAB — COMPREHENSIVE METABOLIC PANEL
A/G RATIO: 0.8 — ABNORMAL LOW (ref 1.2–2.2)
ALBUMIN: 3.9 g/dL (ref 3.8–4.8)
ALKALINE PHOSPHATASE: 93 IU/L (ref 44–121)
ALT (SGPT): 8 IU/L (ref 0–32)
AST (SGOT): 15 IU/L (ref 0–40)
BILIRUBIN TOTAL: 0.4 mg/dL (ref 0.0–1.2)
BLOOD UREA NITROGEN: 18 mg/dL (ref 8–27)
BUN / CREAT RATIO: 4 — ABNORMAL LOW (ref 12–28)
CALCIUM: 9.7 mg/dL (ref 8.7–10.3)
CHLORIDE: 94 mmol/L — ABNORMAL LOW (ref 96–106)
CO2: 30 mmol/L — ABNORMAL HIGH (ref 20–29)
CREATININE: 4.16 mg/dL — ABNORMAL HIGH (ref 0.57–1.00)
GLOBULIN, TOTAL: 4.7 g/dL — ABNORMAL HIGH (ref 1.5–4.5)
GLUCOSE: 91 mg/dL (ref 65–99)
POTASSIUM: 3.9 mmol/L (ref 3.5–5.2)
SODIUM: 138 mmol/L (ref 134–144)
TOTAL PROTEIN: 8.6 g/dL — ABNORMAL HIGH (ref 6.0–8.5)

## 2020-08-21 LAB — CBC W/ DIFFERENTIAL
BANDED NEUTROPHILS ABSOLUTE COUNT: 0 10*3/uL (ref 0.0–0.1)
BASOPHILS ABSOLUTE COUNT: 0 10*3/uL (ref 0.0–0.2)
BASOPHILS RELATIVE PERCENT: 0 %
EOSINOPHILS ABSOLUTE COUNT: 0.1 10*3/uL (ref 0.0–0.4)
EOSINOPHILS RELATIVE PERCENT: 1 %
HEMATOCRIT: 30 % — ABNORMAL LOW (ref 34.0–46.6)
HEMOGLOBIN: 10.1 g/dL — ABNORMAL LOW (ref 11.1–15.9)
IMMATURE GRANULOCYTES: 1 %
LYMPHOCYTES ABSOLUTE COUNT: 1 10*3/uL (ref 0.7–3.1)
LYMPHOCYTES RELATIVE PERCENT: 26 %
MEAN CORPUSCULAR HEMOGLOBIN CONC: 33.7 g/dL (ref 31.5–35.7)
MEAN CORPUSCULAR HEMOGLOBIN: 30.9 pg (ref 26.6–33.0)
MEAN CORPUSCULAR VOLUME: 92 fL (ref 79–97)
MONOCYTES ABSOLUTE COUNT: 0.4 10*3/uL (ref 0.1–0.9)
MONOCYTES RELATIVE PERCENT: 9 %
NEUTROPHILS ABSOLUTE COUNT: 2.3 10*3/uL (ref 1.4–7.0)
NEUTROPHILS RELATIVE PERCENT: 63 %
PLATELET COUNT: 170 10*3/uL (ref 150–450)
RED BLOOD CELL COUNT: 3.27 x10E6/uL — ABNORMAL LOW (ref 3.77–5.28)
RED CELL DISTRIBUTION WIDTH: 13.4 % (ref 11.7–15.4)
WHITE BLOOD CELL COUNT: 3.7 10*3/uL (ref 3.4–10.8)

## 2020-08-21 LAB — GAMMA GT: GAMMA GLUTAMYL TRANSFERASE: 14 IU/L (ref 0–60)

## 2020-08-21 LAB — BILIRUBIN, DIRECT: BILIRUBIN DIRECT: 0.15 mg/dL (ref 0.00–0.40)

## 2020-08-21 LAB — PHOSPHORUS: PHOSPHORUS, SERUM: 2.6 mg/dL — ABNORMAL LOW (ref 3.0–4.3)

## 2020-08-24 LAB — CYCLOSPORINE, LC-MS/MS: Cyclosporine:MCnc:Pt:Bld:Qn:LC/MS/MS: 37 — ABNORMAL LOW

## 2020-08-28 NOTE — Unmapped (Signed)
The [facility] has made a fourth and final attempt to reach this patient to refill the following medication:cyclosporine.      We have left voicemails on the following phone numbers: [phone] and unable to send mychart message.    Dates contacted: 10/25, 10/29, 11/3, 11/5  Last scheduled delivery: 10/6- per fill history patient may be out of meds as of today    The patient may be at risk of non-compliance with this medication. The patient should call the [facility] at [phone] (option 4) to refill medication.    Thad Ranger   [facility] Specialty Pharmacist

## 2020-08-29 ENCOUNTER — Other Ambulatory Visit: Payer: Self-pay | Admitting: Diagnostic Neuroimaging

## 2020-08-31 DIAGNOSIS — Z79899 Other long term (current) drug therapy: Principal | ICD-10-CM

## 2020-08-31 DIAGNOSIS — K769 Liver disease, unspecified: Principal | ICD-10-CM

## 2020-08-31 DIAGNOSIS — Z944 Liver transplant status: Principal | ICD-10-CM

## 2020-09-03 MED FILL — CYCLOSPORINE MODIFIED 25 MG CAPSULE: ORAL | 30 days supply | Qty: 120 | Fill #3

## 2020-09-03 MED FILL — CYCLOSPORINE MODIFIED 25 MG CAPSULE: 30 days supply | Qty: 120 | Fill #3 | Status: AC

## 2020-09-03 NOTE — Unmapped (Signed)
[facility] Clinical Assessment & Refill Coordination Note    Katherine Norton, Katherine Norton: 08/10/1956  Phone: There are no phone numbers on file.    All above HIPAA information was verified with patient.     Was a Nurse, learning disability used for this call? No    Specialty Medication(s):   Transplant: cyclosporine 25mg      Current Outpatient Medications   Medication Sig Dispense Refill   ??? acetaminophen (TYLENOL) 325 MG tablet 2 tablets (650 mg total) by G-tube route every six (6) hours as needed. (Patient taking differently: Take 650 mg by mouth every six (6) hours as needed (headaches). )  0   ??? albuterol 2.5 mg/0.5 mL nebulizer solution Inhale 0.5 mL (2.5 mg total) by nebulization every six (6) hours as needed for wheezing or shortness of breath. 30 each 12   ??? amLODIPine (NORVASC) 5 MG tablet Take 5 mg by mouth daily.     ??? calcitonin, salmon, (MIACALCIN) 200 unit/actuation nasal spray 1 spray by Alternating Nares route once daily.      ??? chlorhexidine (PERIDEX) 0.12 % solution 5 mL by Mouth route two (2) times a day. 1 Bottle 0   ??? cycloSPORINE modified (NEORAL) 25 MG capsule Take 2 capsules (50 mg total) by mouth Two (2) times a day. 360 capsule 3   ??? dextrose (D10W) 10% Soln bolus Infuse 125 mL into a venous catheter every thirty (30) minutes as needed.  0   ??? dextrose 5 % and sodium chloride 0.9 % (D5-NS) infu Infuse 25 mL/hr into a venous catheter continuous as needed (if tube feed held or NPO).  0   ??? famotidine (PEPCID) 20 MG tablet 1 tablet (20 mg total) by G-tube route daily. (Patient taking differently: 20 mg daily. ) 30 tablet 0   ??? gentamicin 1 mg/mL, sodium citrate 4% Gent locks with dialysis 2.4 mL 4   ??? heparin sodium,porcine (HEPARIN, PORCINE,) 1,000 unit/mL 1000 unit/mL injection This heparin is for dialysis use ONLY. RN if you are unable to aspirate from both lumes, call provider piror to using the catheter. 1 mL 0   ??? heparin sodium,porcine (HEPARIN, PORCINE,) 5,000 unit/mL injection Inject 1 mL (5,000 Units total) under the skin every eight (8) hours. 1 mL 0   ??? insulin lispro (HUMALOG) 100 unit/mL injection Inject 0-0.12 mL (0-12 Units total) under the skin Every six (6) hours. (Patient taking differently: Inject 0-12 Units under the skin Three (3) times a day before meals. ) 1440 Units 0   ??? melatonin 3 mg Tab Take 1 tablet (3 mg total) by mouth nightly.  0   ??? oxyCODONE (ROXICODONE) 5 MG immediate release tablet 1 tablet (5 mg total) by G-tube route every six (6) hours as needed. for up to 5 days (Patient taking differently: Take 5 mg by mouth every six (6) hours as needed. ) 10 tablet 0     No current facility-administered medications for this visit.        Changes to medications: Jonae reports no changes at this time.    Allergies   Allergen Reactions   ??? Iodinated Contrast Media    ??? Ioxaglate Sodium    ??? Latex    ??? Succinylcholine Chloride      Other reaction(s): Other (See Comments)       Changes to allergies: No    SPECIALTY MEDICATION ADHERENCE     Cyclosporine 25 mg: 4 days of medicine on hand  Medication Adherence    Patient reported X missed doses in the last month: 0  Specialty Medication: cyclosporine 25mg   Patient is on additional specialty medications: No          Specialty medication(s) dose(s) confirmed: Regimen is correct and unchanged.     Are there any concerns with adherence? No    Adherence counseling provided? Not needed    CLINICAL MANAGEMENT AND INTERVENTION      Clinical Benefit Assessment:    Do you feel the medicine is effective or helping your condition? Yes    Clinical Benefit counseling provided? Not needed    Adverse Effects Assessment:    Are you experiencing any side effects? No    Are you experiencing difficulty administering your medicine? No    Quality of Life Assessment:    How many days over the past month did your liver transplant  keep you from your normal activities? For example, brushing your teeth or getting up in the morning. 0    Have you discussed this with your provider? Not needed    Therapy Appropriateness:    Is therapy appropriate? Yes, therapy is appropriate and should be continued    DISEASE/MEDICATION-SPECIFIC INFORMATION      N/A    PATIENT SPECIFIC NEEDS     - Does the patient have any physical, cognitive, or cultural barriers? No    - Is the patient high risk? No    - Does the patient require a Care Management Plan? No     - Does the patient require physician intervention or other additional services (i.e. nutrition, smoking cessation, social work)? No      SHIPPING     Specialty Medication(s) to be Shipped:   Transplant: cyclosporine 25mg     Other medication(s) to be shipped: No additional medications requested for fill at this time     Changes to insurance: No    Delivery Scheduled: Yes, Expected medication delivery date: 09/04/20.     Medication will be delivered via UPS to the confirmed prescription address in [facility].    The patient will receive a drug information handout for each medication shipped and additional FDA Medication Guides as required.  Verified that patient has previously received a Conservation officer, historic buildings.    All of the patient's questions and concerns have been addressed.    Tera Helper   [facility] Specialty Pharmacist

## 2020-09-21 NOTE — Progress Notes (Signed)
Chief Complaint  Patient presents with  . Follow-up  . Seizures    pt said she jumps a lot at nightslike shes having a seizure.     HISTORY OF PRESENT ILLNESS: Today 09/22/20  Tami [name] is a 64 y.o. female here today for follow up for seizures. She continues levetiracetam 500mg  daily with extra dose of 500mg  on dialysis days (M,W,F). She is tolerating medication well. No seizure activity. She does have some shaking of her whole body at night when in bed. She is aware of shaking and does not lose consciousness. She is able to get up and move around and shaking improves. She lost her son in a car accident last year and now lives alone. She has family locally that helps take care of her and her daughter visits often from [place]. She drives without difficulty.    HISTORY (copied from previous note)  UPDATE (06/18/19, VRP): Since last visit, doing well. Symptoms are stable. No seizures. No alleviating or aggravating factors. Tolerating levetiracetam.    PRIOR HPI (12/17/18): 64 year old female here for evaluation of seizures.  History of liver transplant, hypertension, end-stage renal disease on hemodialysis.  10/03/2018 patient was at office visit, about to give blood when all of a sudden she became unresponsive and had some jerking movements.  Staff could not feel a pulse and started CPR.  Patient was immediately attended to by the rapid response team.  She was taken downstairs to the emergency room.  She may have had some type of gaze deviation initially resolved spontaneously.  While in the emergency room she had a second event with tonic-clonic seizures.  She was given IV Ativan and treated with Keppra.  She was admitted for further evaluation.  MRI, MRA of the head were obtained which showed no acute findings.  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  EEG was unremarkable.  Patient was discharged on no antiseizure medication.  Since that  time patient is doing well.  No further events.    REVIEW OF SYSTEMS: Out of a complete 14 system review of symptoms, the patient complains only of the following symptoms, shaking at night and all other reviewed systems are negative.   ALLERGIES: Allergies  Allergen Reactions  . Iodinated Diagnostic Agents Other (See Comments)    UNSPECIFIED REACTION   . Ioxaglate     UNSPECIFIED REACTION   . Latex     UNSPECIFIED REACTION   . Quelicin  [Succinylcholine Chloride] Other (See Comments)     HOME MEDICATIONS: Outpatient Medications Prior to Visit  Medication Sig Dispense Refill  . albuterol (PROVENTIL HFA;VENTOLIN HFA) 108 (90 Base) MCG/ACT inhaler Inhale 2 puffs into the lungs every 6 (six) hours as needed.     Marland Kitchen allopurinol (ZYLOPRIM) 100 MG tablet Take 100 mg by mouth daily.  5  . amLODipine (NORVASC) 5 MG tablet Take 5 mg by mouth daily.    Lorin Picket 1 GM 210 MG(Fe) tablet TAKE 2 TABLETS BY MOUTH THREE TIMES A DAY WITH MEALS.   SWALLOW WHOLE, DO NOT CHEW OR CRUSH MEDICATION   AND 1 TABLET WITH A SNACK    . B Complex-C-Zn-Folic Acid (DIALYVITE [id number] 15) 0.8 MG TABS Take by mouth.    . cycloSPORINE modified (NEORAL) 25 MG capsule Take 50 mg by mouth 2 (two) times daily.     Marland Kitchen ENSURE (ENSURE) Take 1 Can by mouth daily.     . fluticasone (FLONASE ALLERGY RELIEF)  50 MCG/ACT nasal spray Place into the nose.    . folic acid (FOLVITE) 1 MG tablet Take 1 mg by mouth daily.    Marland Kitchen gabapentin (NEURONTIN) 100 MG capsule TAKE 2 CAPSULE BY MOUTH TWICE A DAY AS NEEDED TAKE FOR PAIN    . labetalol (NORMODYNE) 100 MG tablet Take 100 mg by mouth 2 (two) times daily.    Marland Kitchen levETIRAcetam (KEPPRA) 500 MG tablet TAKE 1 TABLET (500 MG TOTAL) BY MOUTH DAILY AND 1 TABLET (500 MG TOTAL) EVERY MONDAY, WEDNESDAY, AND FRIDAY AT 6 PM. 48 tablet 0  . lidocaine-prilocaine (EMLA) cream APPLY SMALL AMOUNT TO ACCESS SITE (AVF) 30 - 60 MINUTES BEFORE DIALYSIS. COVER WITH OCCLUSIVE DRESSING ( SARAN WRAP)    .  metoprolol succinate (TOPROL-XL) 50 MG 24 hr tablet 1 (ONE) TABLET TABLET ER 24HR DAILY    . Oxycodone HCl 10 MG TABS Take 1 tablet (10 mg total) by mouth every 6 (six) hours as needed for up to 10 doses (severe pain). - hold for sedation (Patient taking differently: Take 10 mg by mouth every 4 (four) hours as needed (severe pain). - hold for sedation) 10 tablet 0  . Pancrelipase, Lip-Prot-Amyl, 24000-76000 units CPEP Pancrelipase 24000-76000    . pantoprazole (PROTONIX) 40 MG tablet Take by mouth.    Marland Kitchen PARoxetine (PAXIL) 10 MG tablet Take by mouth.    . promethazine (PHENERGAN) 12.5 MG tablet Take 12.5 mg by mouth daily.    Marland Kitchen RENAGEL 800 MG tablet TAKE 2 TABLETS BY MOUTH THREE TIMES DAILY WITH MEALS    . Vitamin D, Ergocalciferol, (DRISDOL) 1.25 MG (50000 UT) CAPS capsule Take 50,000 Units by mouth once a week. Sunday    . loratadine (CLARITIN) 10 MG tablet Take 10 mg by mouth daily. X 10 days    . gabapentin (NEURONTIN) 300 MG capsule Take 300 mg by mouth 2 (two) times daily.    . lidocaine-prilocaine (EMLA) cream emla  cream    . ondansetron (ZOFRAN) 4 MG tablet Take 4 mg by mouth every 6 (six) hours as needed for nausea or vomiting.     No facility-administered medications prior to visit.     PAST MEDICAL HISTORY: Past Medical History:  Diagnosis Date  . Anemia   . Arthritis   . ESRD (end stage renal disease) on dialysis (HCC)    "MWF; [place]" (10/04/2018)  . Gout   . Hepatitis, autoimmune (HCC) 12/08/2011  . Hypertension   . Seizures (HCC) 10/03/2018     PAST SURGICAL HISTORY: Past Surgical History:  Procedure Laterality Date  . ABDOMINAL HYSTERECTOMY    . AV FISTULA PLACEMENT Left 10/02/2018   Procedure: Creation of left arm Brachiocephalic Fistula;  Surgeon: Cain, Brandon Christopher, MD;  Location: [facility] OR;  Service: Vascular;  Laterality: Left;  . FISTULA SUPERFICIALIZATION Left 11/29/2018   Procedure: TRANSLOCATION/FISTULA SUPERFICIALIZATION OF LEFT ARM FISTULA;  Surgeon:  Clark, Christopher J, MD;  Location: [facility] OR;  Service: Vascular;  Laterality: Left;  . LIVER TRANSPLANT     19 96  . TONSILLECTOMY       FAMILY HISTORY: Family History  Problem Relation Age of Onset  . Stroke Mother   . Cancer Father   . Lupus Sister        1 sister  . Hypertension Sister   . Cancer Brother        prostate- 1 brother  . Lupus Brother      SOCIAL HISTORY: Social History   Socioeconomic History  .  Marital status: Divorced    Spouse name: Not on file  . Number of children: 2  . Years of education: some college  . Highest education level: Not on file  Occupational History    Comment: na  Tobacco Use  . Smoking status: Never Smoker  . Smokeless tobacco: Never Used  . Tobacco comment: never used tobacco  Vaping Use  . Vaping Use: Never used  Substance and Sexual Activity  . Alcohol use: Not Currently    Alcohol/week: 0.0 standard drinks  . Drug use: No  . Sexual activity: Not on file  Other Topics Concern  . Not on file  Social History Narrative   Lives with son   Caffeine - tea , little    Social Determinants of Health   Financial Resource Strain:   . Difficulty of Paying Living Expenses: Not on file  Food Insecurity:   . Worried About Charity fundraiser in the Last Year: Not on file  . Ran Out of Food in the Last Year: Not on file  Transportation Needs:   . Lack of Transportation (Medical): Not on file  . Lack of Transportation (Non-Medical): Not on file  Physical Activity:   . Days of Exercise per Week: Not on file  . Minutes of Exercise per Session: Not on file  Stress:   . Feeling of Stress : Not on file  Social Connections:   . Frequency of Communication with Friends and Family: Not on file  . Frequency of Social Gatherings with Friends and Family: Not on file  . Attends Religious Services: Not on file  . Active Member of Clubs or Organizations: Not on file  . Attends Archivist Meetings: Not on file  . Marital Status:  Not on file  Intimate Partner Violence:   . Fear of Current or Ex-Partner: Not on file  . Emotionally Abused: Not on file  . Physically Abused: Not on file  . Sexually Abused: Not on file      PHYSICAL EXAM  Vitals:   09/22/20 0831  BP: 134/82  Pulse: 75  Weight: 113 lb (51.3 kg)  Height: 4\' 11"  (1.499 m)   Body mass index is 22.82 kg/m.   Generalized: Well developed, in no acute distress  Cardiology: normal rate and rhythm, no murmur auscultated  Respiratory: clear to auscultation bilaterally    Neurological examination  Mentation: Alert oriented to time, place, history taking. Follows all commands speech and language fluent Cranial nerve II-XII: Pupils were equal round reactive to light. Extraocular movements were full, visual field were full on confrontational test. Facial sensation and strength were normal. Head turning and shoulder shrug  were normal and symmetric. Motor: The motor testing reveals 5 over 5 strength of bilateral upper extremities, 4/5 left hip flexion, 3+/4 right hip flexion.  Sensory: Sensory testing is intact to soft touch on all 4 extremities. No evidence of extinction is noted.  Coordination: Cerebellar testing reveals good finger-nose-finger and heel-to-shin bilaterally.  Gait and station: Gait is stable with cane. Tandem not attempted  Reflexes: Deep tendon reflexes are reduced but symmetric bilaterally.     DIAGNOSTIC DATA (LABS, IMAGING, TESTING) - I reviewed patient records, labs, notes, testing and imaging myself where available.  Lab Results  Component Value Date   WBC 4.5 02/20/2020   HGB 10.5 (L) 02/20/2020   HCT 32.8 (L) 02/20/2020   MCV 101.2 (H) 02/20/2020   PLT 106 (L) 02/20/2020      Component Value Date/Time  NA 137 02/20/2020 1411   NA 137 08/10/2018 0000   NA 140 10/13/2017 1106   NA 138 04/11/2017 1023   K 2.8 (LL) 02/20/2020 1411   K 5.3 no visable hemolysis (H) 10/13/2017 1106   K 5.2 No visable hemolysis (H)  04/11/2017 1023   CL 92 (L) 02/20/2020 1411   CL 104 10/13/2017 1106   CO2 32 02/20/2020 1411   CO2 17 (L) 10/13/2017 1106   CO2 19 (L) 04/11/2017 1023   GLUCOSE 116 (H) 02/20/2020 1411   GLUCOSE 116 10/13/2017 1106   BUN 13 02/20/2020 1411   BUN 1 (A) 08/10/2018 0000   BUN 58 (H) 10/13/2017 1106   BUN 33.4 (H) 04/11/2017 1023   CREATININE 4.00 (HH) 02/20/2020 1411   CREATININE 5.9 (HH) 10/13/2017 1106   CREATININE 1.8 (H) 04/11/2017 1023   CALCIUM 9.3 02/20/2020 1411   CALCIUM 8.4 10/13/2017 1106   CALCIUM 9.0 04/11/2017 1023   PROT 7.9 02/20/2020 1411   PROT 7.7 10/13/2017 1106   PROT 8.8 (H) 04/11/2017 1023   ALBUMIN 3.6 02/20/2020 1411   ALBUMIN 3.3 10/13/2017 1106   ALBUMIN 3.7 04/11/2017 1023   AST 15 02/20/2020 1411   AST 16 04/11/2017 1023   ALT 5 02/20/2020 1411   ALT 15 10/13/2017 1106   ALT 8 04/11/2017 1023   ALKPHOS 71 02/20/2020 1411   ALKPHOS 68 10/13/2017 1106   ALKPHOS 80 04/11/2017 1023   BILITOT 0.5 02/20/2020 1411   BILITOT 0.54 04/11/2017 1023   GFRNONAA 11 (L) 02/20/2020 1411   GFRAA 13 (L) 02/20/2020 1411   Lab Results  Component Value Date   CHOL 124 10/03/2018   HDL 23 (L) 10/03/2018   LDLCALC 73 10/03/2018   TRIG 138 10/03/2018   CHOLHDL 5.4 10/03/2018   No results found for: HGBA1C Lab Results  Component Value Date   VITAMINB12 301 10/03/2018   Lab Results  Component Value Date   TSH 0.831 10/03/2018      ASSESSMENT AND PLAN  64 y.o. year old female  has a past medical history of Anemia, Arthritis, ESRD (end stage renal disease) on dialysis ([place]), Gout, Hepatitis, autoimmune ([place]) (12/08/2011), Hypertension, and Seizures ([place]) (10/03/2018). 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  She continues levetiracetam is tolerating well.  We will continue 500 mg daily with an extra dose of 500 mg on Monday Wednesday Friday.  Refills have been sent  to the pharmacy.  She will continue close follow-up with care team.  Healthy lifestyle habits encouraged.  She will follow-up with me in 1 year, sooner if needed.  She verbalizes understanding and agreement with this plan.  I spent 20 minutes of face-to-face and non-face-to-face time with patient.  This included previsit chart review, lab review, study review, order entry, electronic health record documentation, patient education.    Debbora Presto, MSN, FNP-C 09/22/2020, 8:38 AM  [facility] [address] [place], [place] [address] [phone]

## 2020-09-21 NOTE — Patient Instructions (Signed)
Below is our plan:  We will continue levetiracetam 500mg  daily with an extra 500mg  dose on dialysis days (M,W,F).   Please make sure you are staying well hydrated. I recommend 50-60 ounces daily. Well balanced diet and regular exercise encouraged.    Please continue follow up with care team as directed.   Follow up in 1 year   You may receive a survey regarding today's visit. I encourage you to leave honest feed back as I do use this information to improve patient care. Thank you for seeing me today!     Seizure, Adult A seizure is a sudden burst of abnormal electrical activity in the brain. Seizures usually last from 30 seconds to 2 minutes. They can cause many different symptoms. Usually, seizures are not harmful unless they last a long time. What are the causes? Common causes of this condition include:  Fever or infection.  Conditions that affect the brain, such as: ? A brain abnormality that you were born with. ? A brain or head injury. ? Bleeding in the brain. ? A tumor. ? Stroke. ? Brain disorders such as autism or cerebral palsy.  Low blood sugar.  Conditions that are passed from parent to child (are inherited).  Problems with substances, such as: ? Having a reaction to a drug or a medicine. ? Suddenly stopping the use of a substance (withdrawal). In some cases, the cause may not be known. A person who has repeated seizures over time without a clear cause has a condition called epilepsy. What increases the risk? You are more likely to get this condition if you have:  A family history of epilepsy.  Had a seizure in the past.  A brain disorder.  A history of head injury, lack of oxygen at birth, or strokes. What are the signs or symptoms? There are many types of seizures. The symptoms vary depending on the type of seizure you have. Examples of symptoms during a seizure include:  Shaking (convulsions).  Stiffness in the body.  Passing out (losing  consciousness).  Head nodding.  Staring.  Not responding to sound or touch.  Loss of bladder control and bowel control. Some people have symptoms right before and right after a seizure happens. Symptoms before a seizure may include:  Fear.  Worry (anxiety).  Feeling like you may vomit (nauseous).  Feeling like the room is spinning (vertigo).  Feeling like you saw or heard something before (dj vu).  Odd tastes or smells.  Changes in how you see. You may see flashing lights or spots. Symptoms after a seizure happens can include:  Confusion.  Sleepiness.  Headache.  Weakness on one side of the body. How is this treated? Most seizures will stop on their own in under 5 minutes. In these cases, no treatment is needed. Seizures that last longer than 5 minutes will usually need treatment. Treatment can include:  Medicines given through an IV tube.  Avoiding things that are known to cause your seizures. These can include medicines that you take for another condition.  Medicines to treat epilepsy.  Surgery to stop the seizures. This may be needed if medicines do not help. Follow these instructions at home: Medicines  Take over-the-counter and prescription medicines only as told by your doctor.  Do not eat or drink anything that may keep your medicine from working, such as alcohol. Activity  Do not do any activities that would be dangerous if you had another seizure, like driving or swimming. Wait until your  doctor says it is safe for you to do them.  If you live in the [place] ask your local DMV (department of motor vehicles) when you can drive.  Get plenty of rest. Teaching others Teach friends and family what to do when you have a seizure. They should:  Lay you on the ground.  Protect your head and body.  Loosen any tight clothing around your neck.  Turn you on your side.  Not hold you down.  Not put anything into your mouth.  Know whether or not you  need emergency care.  Stay with you until you are better.  General instructions  Contact your doctor each time you have a seizure.  Avoid anything that gives you seizures.  Keep a seizure diary. Write down: ? What you think caused each seizure. ? What you remember about each seizure.  Keep all follow-up visits as told by your doctor. This is important. Contact a doctor if:  You have another seizure.  You have seizures more often.  There is any change in what happens during your seizures.  You keep having seizures with treatment.  You have symptoms of being sick or having an infection. Get help right away if:  You have a seizure that: ? Lasts longer than 5 minutes. ? Is different than seizures you had before. ? Makes it harder to breathe. ? Happens after you hurt your head.  You have any of these symptoms after a seizure: ? Not being able to speak. ? Not being able to use a part of your body. ? Confusion. ? A bad headache.  You have two or more seizures in a row.  You do not wake up right after a seizure.  You get hurt during a seizure. These symptoms may be an emergency. Do not wait to see if the symptoms will go away. Get medical help right away. Call your local emergency services (911 in the [place] Do not drive yourself to the hospital. Summary  Seizures usually last from 30 seconds to 2 minutes. Usually, they are not harmful unless they last a long time.  Do not eat or drink anything that may keep your medicine from working, such as alcohol.  Teach friends and family what to do when you have a seizure.  Contact your doctor each time you have a seizure. This information is not intended to replace advice given to you by your health care provider. Make sure you discuss any questions you have with your health care provider. Document Revised: 12/28/2018 Document Reviewed: 12/28/2018 Elsevier Patient Education  [place].

## 2020-09-22 ENCOUNTER — Ambulatory Visit (INDEPENDENT_AMBULATORY_CARE_PROVIDER_SITE_OTHER): Payer: Medicare Other | Admitting: Family Medicine

## 2020-09-22 ENCOUNTER — Encounter: Payer: Self-pay | Admitting: Family Medicine

## 2020-09-22 VITALS — BP 134/82 | HR 75 | Ht 59.0 in | Wt 113.0 lb

## 2020-09-22 DIAGNOSIS — N186 End stage renal disease: Secondary | ICD-10-CM

## 2020-09-22 DIAGNOSIS — E1122 Type 2 diabetes mellitus with diabetic chronic kidney disease: Secondary | ICD-10-CM | POA: Diagnosis not present

## 2020-09-22 DIAGNOSIS — Z992 Dependence on renal dialysis: Secondary | ICD-10-CM

## 2020-09-22 DIAGNOSIS — R569 Unspecified convulsions: Secondary | ICD-10-CM

## 2020-09-22 MED ORDER — LEVETIRACETAM 500 MG PO TABS: ORAL_TABLET | ORAL | 3 refills | Status: DC

## 2020-09-28 DIAGNOSIS — Z79899 Other long term (current) drug therapy: Principal | ICD-10-CM

## 2020-09-28 DIAGNOSIS — Z944 Liver transplant status: Principal | ICD-10-CM

## 2020-09-28 DIAGNOSIS — K769 Liver disease, unspecified: Principal | ICD-10-CM

## 2020-09-30 ENCOUNTER — Other Ambulatory Visit: Payer: Self-pay | Admitting: Diagnostic Neuroimaging

## 2020-10-02 MED FILL — CYCLOSPORINE MODIFIED 25 MG CAPSULE: 30 days supply | Qty: 120 | Fill #4 | Status: AC

## 2020-10-02 MED FILL — CYCLOSPORINE MODIFIED 25 MG CAPSULE: ORAL | 30 days supply | Qty: 120 | Fill #4

## 2020-10-02 NOTE — Unmapped (Signed)
[facility] Refill Coordination Note    Specialty Medication(s) to be Shipped:   Transplant: cyclosporine 25mg     Other medication(s) to be shipped: No additional medications requested for fill at this time     Katherine Norton, DOB: July 04, 1956  Phone: There are no phone numbers on file.      All above HIPAA information was verified with patient.     Was a Nurse, learning disability used for this call? No    Completed refill call assessment today to schedule patient's medication shipment from the [facility] [phone]).       Specialty medication(s) and dose(s) confirmed: Regimen is correct and unchanged.   Changes to medications: Katherine Norton reports no changes at this time.  Changes to insurance: No  Questions for the pharmacist: No    Confirmed patient received Welcome Packet with first shipment. The patient will receive a drug information handout for each medication shipped and additional FDA Medication Guides as required.       DISEASE/MEDICATION-SPECIFIC INFORMATION        N/A    SPECIALTY MEDICATION ADHERENCE     Medication Adherence    Patient reported X missed doses in the last month: 0  Specialty Medication: Cyclosporine 25mg   Patient is on additional specialty medications: No  Patient is on more than two specialty medications: No                cyclosporine 25 mg: 6 days of medicine on hand         SHIPPING     Shipping address confirmed in Epic.     Delivery Scheduled: Yes, Expected medication delivery date: 10/05/20.     Medication will be delivered via UPS to the prescription address in Epic WAM.    Katherine Norton [facility] Specialty Technician

## 2020-10-05 NOTE — Telephone Encounter (Signed)
error 

## 2020-10-09 DIAGNOSIS — Z9181 History of falling: Principal | ICD-10-CM

## 2020-10-09 DIAGNOSIS — Z01818 Encounter for other preprocedural examination: Principal | ICD-10-CM

## 2020-10-09 DIAGNOSIS — Z9889 Other specified postprocedural states: Principal | ICD-10-CM

## 2020-10-09 DIAGNOSIS — Z8709 Personal history of other diseases of the respiratory system: Principal | ICD-10-CM

## 2020-10-09 DIAGNOSIS — N186 End stage renal disease: Principal | ICD-10-CM

## 2020-10-16 MED ORDER — ONDANSETRON HCL 4 MG TABLET
0.00000 days
Start: 2020-10-16 — End: ?

## 2020-10-26 DIAGNOSIS — K769 Liver disease, unspecified: Principal | ICD-10-CM

## 2020-10-26 DIAGNOSIS — Z944 Liver transplant status: Principal | ICD-10-CM

## 2020-10-26 DIAGNOSIS — Z79899 Other long term (current) drug therapy: Principal | ICD-10-CM

## 2020-11-05 MED FILL — CYCLOSPORINE MODIFIED 25 MG CAPSULE: ORAL | 30 days supply | Qty: 120 | Fill #5

## 2020-11-05 NOTE — Unmapped (Signed)
[facility] Refill Coordination Note    Specialty Medication(s) to be Shipped:   Transplant: cyclosporine 25mg     Other medication(s) to be shipped: No additional medications requested for fill at this time     Katherine Norton, DOB: Jul 27, 1956  Phone: There are no phone numbers on file.      All above HIPAA information was verified with patient.     Was a Nurse, learning disability used for this call? No    Completed refill call assessment today to schedule patient's medication shipment from the [facility] [phone]).       Specialty medication(s) and dose(s) confirmed: Regimen is correct and unchanged.   Changes to medications: Katherine Norton reports no changes at this time.  Changes to insurance: No  Questions for the pharmacist: No    Confirmed patient received Welcome Packet with first shipment. The patient will receive a drug information handout for each medication shipped and additional FDA Medication Guides as required.       DISEASE/MEDICATION-SPECIFIC INFORMATION        N/A    SPECIALTY MEDICATION ADHERENCE     Medication Adherence    Patient reported X missed doses in the last month: 0  Specialty Medication: Cyclosporine 25mg   Patient is on additional specialty medications: No        Cyclosporine 25 mg: 7 days of medicine on hand     SHIPPING     Shipping address confirmed in Epic.     Delivery Scheduled: Yes, Expected medication delivery date: 11/06/2020.     Medication will be delivered via UPS to the prescription address in Epic WAM.    Katherine Norton [facility] Specialty Technician

## 2020-11-20 LAB — CBC W/ DIFFERENTIAL
BANDED NEUTROPHILS ABSOLUTE COUNT: 0 10*3/uL (ref 0.0–0.1)
BASOPHILS ABSOLUTE COUNT: 0 10*3/uL (ref 0.0–0.2)
BASOPHILS RELATIVE PERCENT: 0 %
EOSINOPHILS ABSOLUTE COUNT: 0.1 10*3/uL (ref 0.0–0.4)
EOSINOPHILS RELATIVE PERCENT: 3 %
HEMATOCRIT: 32.3 % — ABNORMAL LOW (ref 34.0–46.6)
HEMOGLOBIN: 10.9 g/dL — ABNORMAL LOW (ref 11.1–15.9)
IMMATURE GRANULOCYTES: 0 %
LYMPHOCYTES ABSOLUTE COUNT: 0.5 10*3/uL — ABNORMAL LOW (ref 0.7–3.1)
LYMPHOCYTES RELATIVE PERCENT: 24 %
MEAN CORPUSCULAR HEMOGLOBIN CONC: 33.7 g/dL (ref 31.5–35.7)
MEAN CORPUSCULAR HEMOGLOBIN: 30.1 pg (ref 26.6–33.0)
MEAN CORPUSCULAR VOLUME: 89 fL (ref 79–97)
MONOCYTES ABSOLUTE COUNT: 0.4 10*3/uL (ref 0.1–0.9)
MONOCYTES RELATIVE PERCENT: 17 %
NEUTROPHILS ABSOLUTE COUNT: 1.3 10*3/uL — ABNORMAL LOW (ref 1.4–7.0)
NEUTROPHILS RELATIVE PERCENT: 56 %
PLATELET COUNT: 171 10*3/uL (ref 150–450)
RED BLOOD CELL COUNT: 3.62 x10E6/uL — ABNORMAL LOW (ref 3.77–5.28)
RED CELL DISTRIBUTION WIDTH: 12.8 % (ref 11.7–15.4)
WHITE BLOOD CELL COUNT: 2.3 10*3/uL — CL (ref 3.4–10.8)

## 2020-11-20 LAB — COMPREHENSIVE METABOLIC PANEL
A/G RATIO: 0.9 — ABNORMAL LOW (ref 1.2–2.2)
ALBUMIN: 4.4 g/dL (ref 3.8–4.8)
ALKALINE PHOSPHATASE: 105 IU/L (ref 44–121)
ALT (SGPT): 10 IU/L (ref 0–32)
AST (SGOT): 18 IU/L (ref 0–40)
BILIRUBIN TOTAL: 0.3 mg/dL (ref 0.0–1.2)
BLOOD UREA NITROGEN: 20 mg/dL (ref 8–27)
BUN / CREAT RATIO: 4 — ABNORMAL LOW (ref 12–28)
CALCIUM: 10 mg/dL (ref 8.7–10.3)
CHLORIDE: 95 mmol/L — ABNORMAL LOW (ref 96–106)
CO2: 26 mmol/L (ref 20–29)
CREATININE: 4.94 mg/dL — ABNORMAL HIGH (ref 0.57–1.00)
GLOBULIN, TOTAL: 4.9 g/dL — ABNORMAL HIGH (ref 1.5–4.5)
GLUCOSE: 85 mg/dL (ref 65–99)
POTASSIUM: 4.4 mmol/L (ref 3.5–5.2)
SODIUM: 140 mmol/L (ref 134–144)
TOTAL PROTEIN: 9.3 g/dL — ABNORMAL HIGH (ref 6.0–8.5)

## 2020-11-20 LAB — PHOSPHORUS: PHOSPHORUS, SERUM: 3.3 mg/dL (ref 3.0–4.3)

## 2020-11-20 LAB — MAGNESIUM: MAGNESIUM: 2.2 mg/dL (ref 1.6–2.3)

## 2020-11-20 LAB — BILIRUBIN, DIRECT: BILIRUBIN DIRECT: 0.12 mg/dL (ref 0.00–0.40)

## 2020-11-20 LAB — GAMMA GT: GAMMA GLUTAMYL TRANSFERASE: 17 IU/L (ref 0–60)

## 2020-11-23 DIAGNOSIS — Z944 Liver transplant status: Principal | ICD-10-CM

## 2020-11-23 DIAGNOSIS — K769 Liver disease, unspecified: Principal | ICD-10-CM

## 2020-11-23 DIAGNOSIS — Z79899 Other long term (current) drug therapy: Principal | ICD-10-CM

## 2020-11-23 NOTE — Progress Notes (Signed)
I reviewed note and agree with plan.   Penni Bombard, MD [id number], [id number] PM Certified in Neurology, Neurophysiology and Neuroimaging  [facility] [address], [place], [place] [address] [phone]

## 2020-11-24 LAB — CYCLOSPORINE LEVEL: CYCLOSPORINE, LC-MS/MS: 37 ng/mL — ABNORMAL LOW (ref 100–400)

## 2020-11-24 NOTE — Unmapped (Signed)
Reviewed 1/27 subtherapeutic CsA with NP Martin-Velez, goal adjusted to 30-70. LFTs stable.

## 2020-12-01 MED ORDER — PREDNISONE 20 MG TABLET
0 days
Start: 2020-12-01 — End: 2021-01-12

## 2020-12-02 MED ORDER — CETIRIZINE 5 MG TABLET
0.00000 days
Start: 2020-12-02 — End: ?

## 2020-12-07 MED FILL — CYCLOSPORINE MODIFIED 25 MG CAPSULE: ORAL | 30 days supply | Qty: 120 | Fill #6

## 2020-12-07 NOTE — Unmapped (Signed)
[facility] Refill Coordination Note    Specialty Medication(s) to be Shipped:   Transplant: cyclosporine 25mg     Other medication(s) to be shipped: No additional medications requested for fill at this time     Katherine Norton, DOB: Jan 15, 1956  Phone: There are no phone numbers on file.      All above HIPAA information was verified with patient.     Was a Nurse, learning disability used for this call? No    Completed refill call assessment today to schedule patient's medication shipment from the [facility] [phone]).       Specialty medication(s) and dose(s) confirmed: Regimen is correct and unchanged.   Changes to medications: Lindsie reports no changes at this time.  Changes to insurance: No  Questions for the pharmacist: No    Confirmed patient received Welcome Packet with first shipment. The patient will receive a drug information handout for each medication shipped and additional FDA Medication Guides as required.       DISEASE/MEDICATION-SPECIFIC INFORMATION        N/A    SPECIALTY MEDICATION ADHERENCE     Medication Adherence    Patient reported X missed doses in the last month: 0  Specialty Medication: cycloSPORINE modified (NEORAL) 25 MG capsule  Patient is on additional specialty medications: No  Any gaps in refill history greater than 2 weeks in the last 3 months: no  Demonstrates understanding of importance of adherence: yes  Informant: patient  Reliability of informant: reliable  Confirmed plan for next specialty medication refill: delivery by pharmacy  Refills needed for supportive medications: not needed        Cyclosporine 25 mg: 3 days of medicine on hand     SHIPPING     Shipping address confirmed in Epic.     Delivery Scheduled: Yes, Expected medication delivery date: 12/08/2020.     Medication will be delivered via UPS to the prescription address in Epic WAM.    Kaizen Ibsen D Nycole Kawahara   [facility] Specialty Technician

## 2020-12-11 DIAGNOSIS — Z944 Liver transplant status: Principal | ICD-10-CM

## 2020-12-11 DIAGNOSIS — Z79899 Other long term (current) drug therapy: Principal | ICD-10-CM

## 2020-12-11 NOTE — Unmapped (Signed)
Standing labs entered per new protocol.

## 2020-12-18 MED ORDER — LEVETIRACETAM 500 MG TABLET
0.00000 days
Start: 2020-12-18 — End: ?

## 2021-01-04 DIAGNOSIS — Z79899 Other long term (current) drug therapy: Principal | ICD-10-CM

## 2021-01-04 DIAGNOSIS — Z944 Liver transplant status: Principal | ICD-10-CM

## 2021-01-07 NOTE — Unmapped (Signed)
The [facility] has made a third and final attempt to reach this patient to refill the following medication:cyclosporine.      We have left voicemails on the following phone numbers: [phone].    Dates contacted: 3/7, 3/16, 3/17  Last scheduled delivery: 12/08/20    The patient may be at risk of non-compliance with this medication. The patient should call the [facility] at [phone] (option 4) to refill medication.    Tera Helper   [facility] Specialty Pharmacist

## 2021-01-08 MED FILL — CYCLOSPORINE MODIFIED 25 MG CAPSULE: ORAL | 30 days supply | Qty: 120 | Fill #7

## 2021-01-08 NOTE — Unmapped (Signed)
Pt called stating that she is almost out of cyclosporine. Informed pt that [facility] has been trying to get a hold of her to schedule shipment of refill. Pt reports that her phone has been acting up but she will call [facility] now to schedule shipment.

## 2021-01-08 NOTE — Unmapped (Signed)
[facility] Refill Coordination Note    Specialty Medication(s) to be Shipped:   Transplant: cyclosporine mod 25mg     Other medication(s) to be shipped: No additional medications requested for fill at this time     Katherine Norton, DOB: 1956/08/12  Phone: There are no phone numbers on file.      All above HIPAA information was verified with patient.     Was a Nurse, learning disability used for this call? No    Completed refill call assessment today to schedule patient's medication shipment from the [facility] [phone]).       Specialty medication(s) and dose(s) confirmed: Regimen is correct and unchanged.   Changes to medications: Katherine Norton reports no changes at this time.  Changes to insurance: No  Questions for the pharmacist: No    Confirmed patient received Welcome Packet with first shipment. The patient will receive a drug information handout for each medication shipped and additional FDA Medication Guides as required.       DISEASE/MEDICATION-SPECIFIC INFORMATION        N/A    SPECIALTY MEDICATION ADHERENCE     Medication Adherence    Patient reported X missed doses in the last month: 0  Specialty Medication: cyclosporine mod 25mg                 Cyclosporine mod 25mg   : 2 days of medicine on hand         SHIPPING     Shipping address confirmed in Epic.     Delivery Scheduled: Yes, Expected medication delivery date: 3/21.     Medication will be delivered via UPS to the prescription address in Epic WAM.    Katherine Norton   [facility] Specialty Technician

## 2021-01-11 MED ORDER — HECTOROL IV
0 days
Start: 2021-01-11 — End: 2022-01-10

## 2021-01-12 ENCOUNTER — Ambulatory Visit: Admit: 2021-01-12 | Discharge: 2021-01-13 | Payer: MEDICARE | Attending: Pulmonary Disease | Primary: Pulmonary Disease

## 2021-01-12 ENCOUNTER — Ambulatory Visit: Admit: 2021-01-12 | Discharge: 2021-01-13 | Payer: MEDICARE

## 2021-01-12 NOTE — Unmapped (Signed)
Thank you for allowing me to be a part of your care.  Please call the clinic with any questions.  ??  Christen Butter, MD  Pulmonary and Critical Care Medicine  [address]  [id number]  [place], [place] [address]  ??  Thank you for your visit to the [facility].   ??  Between appointments, you can reach [name] at these numbers:  ??  For appointments or the Pulmonary Nurse: [phone]  Fax: [phone]  ??  For urgent issues after hours:  Hospital Operator: [phone], ask for Pulmonary Fellow on call

## 2021-01-12 NOTE — Unmapped (Signed)
[facility]          Initial  Pulmonary Visit    Referring Physician :  Griffin Dakin  PCP:     [facility]  Reason for Consult:   Evaluation of asthma    HISTORY:     History of Present Illness:  Ms. Escalante is a 65 y.o. 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    HPI  Patient is presenting with respiratory symptoms that started 3-4 years ago. Symptoms include chest tightness, wheezing, and cough. She dnies fever, sputum production, hemoptysis, weight loss, and night sweats. Also, no orthopnea, LE swelling, or syncope.    The pattern of these symptoms is intermittent (ie, varying over time and in intensity). Symptoms previously triggered by allergen exposure.    ??? Aggravating conditions: seasonal allergy and sinus disease.   ??? ANCA vasculitis symptoms (hemoptysis, hematuria, arthralgias, peripheral neuropathy, skin rash, fatigue): No.  ??? Frequent exacerbations: No.    Asthma therapies:  ??? Response to albuterol and/or prednisone Yes.  ??? Current therapies: PRN albuterol. Only needed once a month or so.  ??? Biologics: No.    Asthma control in the past 4 weeks:   Well controlled: symptoms <2X/week, night-time awakenings <1X/month, using rescue inhaler <2X/week.     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     Family history   FHx of asthma No.      Social History  Lifelong non-smoker; worked in a Electrical engineer. No environmental or occupational exposures.     Past Medical History:  Past Medical History:   Diagnosis Date   ??? Chronic kidney disease    ??? Cirrhosis (CMS-HCC)    ??? HTN (hypertension)      Past Surgical History:   Procedure Laterality Date   ??? BRONCHOSCOPY  06/03/2018        ??? HYSTERECTOMY     ??? 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 PR TRACHEOSTOMY, PLANNED Midline 06/26/2018    Procedure: TRACHEOSTOMY PLANNED (SEPART PROC);  Surgeon: Theodosia Blender, MD;  Location: MAIN OR [facility];  Service: ENT   ??? TONSILLECTOMY         Other History:  The social history and family history were personally reviewed and updated in the patient's electronic medical record.    Family History   Problem Relation Age of Onset   ??? Hypertension Mother    ??? Hypertension Father    ??? Prostate cancer Father    ??? Lupus Sister    ??? Lupus Brother    ??? Kidney disease Neg Hx      Social History     Socioeconomic History   ??? Marital status: Single     Spouse name: Not on file   ??? Number of children: Not on file   ??? Years of education: Not on file   ??? Highest education level: Not on file   Occupational History   ??? Not on file   Tobacco Use   ??? Smoking status: Never Smoker   ??? Smokeless tobacco: Never Used   Substance and Sexual Activity   ???  Alcohol use: No     Alcohol/week: 0.0 standard drinks   ??? Drug use: No   ??? Sexual activity: Not on file   Other Topics Concern   ??? Not on file   Social History Narrative   ??? Not on file     Social Determinants of Health     Financial Resource Strain: Not on file   Food Insecurity: Not on file   Transportation Needs: Not on file   Physical Activity: Not on file   Stress: Not on file   Social Connections: Not on file       Home Medications:  Current Outpatient Medications on File Prior to Visit   Medication Sig Dispense Refill   ??? acetaminophen (TYLENOL) 325 MG tablet 2 tablets (650 mg total) by G-tube route every six (6) hours as needed. (Patient taking differently: Take 650 mg by mouth every six (6) hours as needed (headaches). )  0   ??? albuterol 2.5 mg/0.5 mL nebulizer solution Inhale 0.5 mL (2.5 mg total) by nebulization every six (6) hours as needed for wheezing or shortness of breath. 30 each 12 ??? amLODIPine (NORVASC) 5 MG tablet Take 5 mg by mouth daily.     ??? calcitonin, salmon, (MIACALCIN) 200 unit/actuation nasal spray 1 spray by Alternating Nares route once daily.      ??? chlorhexidine (PERIDEX) 0.12 % solution 5 mL by Mouth route two (2) times a day. 1 Bottle 0   ??? cycloSPORINE modified (NEORAL) 25 MG capsule Take 2 capsules (50 mg total) by mouth Two (2) times a day. 360 capsule 3   ??? dextrose (D10W) 10% Soln bolus Infuse 125 mL into a venous catheter every thirty (30) minutes as needed.  0   ??? dextrose 5 % and sodium chloride 0.9 % (D5-NS) infu Infuse 25 mL/hr into a venous catheter continuous as needed (if tube feed held or NPO).  0   ??? famotidine (PEPCID) 20 MG tablet 1 tablet (20 mg total) by G-tube route daily. (Patient taking differently: 20 mg daily. ) 30 tablet 0   ??? gentamicin 1 mg/mL, sodium citrate 4% Gent locks with dialysis 2.4 mL 4   ??? heparin sodium,porcine (HEPARIN, PORCINE,) 1,000 unit/mL 1000 unit/mL injection This heparin is for dialysis use ONLY. RN if you are unable to aspirate from both lumes, call provider piror to using the catheter. 1 mL 0   ??? heparin sodium,porcine (HEPARIN, PORCINE,) 5,000 unit/mL injection Inject 1 mL (5,000 Units total) under the skin every eight (8) hours. 1 mL 0   ??? insulin lispro (HUMALOG) 100 unit/mL injection Inject 0-0.12 mL (0-12 Units total) under the skin Every six (6) hours. (Patient taking differently: Inject 0-12 Units under the skin Three (3) times a day before meals. ) 1440 Units 0   ??? melatonin 3 mg Tab Take 1 tablet (3 mg total) by mouth nightly.  0   ??? oxyCODONE (ROXICODONE) 5 MG immediate release tablet 1 tablet (5 mg total) by G-tube route every six (6) hours as needed. for up to 5 days (Patient taking differently: Take 5 mg by mouth every six (6) hours as needed. ) 10 tablet 0     No current facility-administered medications on file prior to visit.       Allergies:  Allergies as of 01/12/2021 - Reviewed 01/08/2021   Allergen Reaction Noted   ??? Iodinated contrast media  04/21/2014   ??? Ioxaglate sodium  12/07/2015   ??? Latex  04/21/2014   ???  Succinylcholine chloride  08/20/2019       Review of Systems:  A comprehensive review of systems was completed and negative except as noted in HPI.    PHYSICAL EXAM:     Vitals:    01/12/21 0759   BP: 113/74   Pulse: 77   Temp: 36.6 ??C (97.9 ??F)   SpO2: 97%   General appearance - comfortable, in no respiratory distress  Psych-awake, alert, and oriented X3  Eyes - Sclera anicteric, conjunctiva pink  Mouth - moist  Neck/EENT - Trachea supple and midline, (-) JVD   Lymphatics/Hem - No enlarged LNs  CV- Normal S1 and S2, no murmur or gallop  Resp- Normal air movement bilaterally  GI- soft, non-tender abdomen, no organomegaly  MSK-- No pedal edema, no clubbing or cyanosis  Skin - no rash  Neuro-non-focal     LABORATORY and RADIOLOGY DATA:     Pulmonary Function Tests/Interpretation:  01/12/21:       Pertinent Laboratory Data:  Lab Results   Component Value Date    WBC 2.3 (LL) 11/19/2020    HGB 10.9 (L) 11/19/2020    HCT 32.3 (L) 11/19/2020    PLT 171 11/19/2020       Lab Results   Component Value Date    NA 140 11/19/2020    K 4.4 11/19/2020    CL 95 (L) 11/19/2020    CO2 26 11/19/2020    BUN 20 11/19/2020    CREATININE 4.94 (H) 11/19/2020    GLU 89 05/14/2020    CALCIUM 10.0 11/19/2020    MG 2.2 11/19/2020    PHOS 7.5 (H) 05/14/2020       Lab Results   Component Value Date    BILITOT 0.3 11/19/2020    BILIDIR 0.12 11/19/2020    PROT 9.3 (H) 11/19/2020    ALBUMIN 3.2 (L) 05/14/2020    ALT 10 11/19/2020    AST 18 11/19/2020    ALKPHOS 105 11/19/2020    GGT 17 11/19/2020       Lab Results   Component Value Date    PT 12.2 03/19/2020    INR 1.03 03/19/2020    APTT 30.3 03/19/2020         Pertinent Imaging Data: I personally reviewed imaging studies  CXR 05/14/20:  No pleural effusion or pneumothorax.  ??  Cardiomediastinal silhouette is normal in size. Tortuous aorta.  ??  Prior right-sided rib fractures. Thoracic kyphosis with similar multilevel compression deformities. Right upper quadrant abdominal surgical clips.  ??  IMPRESSION:  ??  No acute findings.      Cardiac workup:  ECHO 07/16/20:   1. Technically difficult study due to chest wall/lung interference.    2. The left ventricle is normal in size with mildly increased wall  thickness.    3. The left ventricular systolic function is normal, LVEF is visually  estimated at > 55%.    4. The right ventricle is upper normal in size, with normal systolic  function.    5. There is no pulmonary hypertension, estimated pulmonary artery systolic  pressure is 24 mmHg.       RHC: N/A      ASSESSMENT      Ms. Bhardwaj is a 65 y.o. 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    Patient had asthma symptoms 2-3 years ago but is currently entirely asymptomatic. Physical examination today is unremarkable. Her symptoms are seasonal (when exposed to allergens, etc). She was treated with PRN albuterol with excellent response, and her asthma appears to be very well controlled (only uses albyterol once every month or 2).  PFTs revealed moderate restriction and markedly reduced DLCO but no evidence of obstructive ventilatory defect. CXR last year was normal with no evidence of ILD and ECHO did not show any evidence of pulmonary hypertension. 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    We discussed the importance of adequately treating sinus disease and seasonal allergy to achieve good control of asthma symptoms. Also, provided teaching on how to use inhalers.  At this time, patient has no features to suggest ANCA vsaculitis or ABPA.     PLAN     Asthma (mild intermittent, well controlled):  -- Continue PRN albuterol and anti-histamines  -- Nasal hygiene regimen including BID isotonic nasal irrigation followed by Flonase nasal spray (2 sprays BID each nostril).     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      Patient will return to clinic as needed.          Miroslava Santellan O. Wellington Hampshire, MD   Assistant Professor of Medicine  Pulmonary and Critical Care Medicine  [facility]    GN:FAOZH Mosetta Putt, [facility]

## 2021-01-21 NOTE — Unmapped (Signed)
Received a call fr pt requesting to have lab orders sent to [facility] on [address] [place], [place]. Called and spoke to manager Shawn who provided their fax number. Standing lab orders sent to [phone].

## 2021-02-01 DIAGNOSIS — Z944 Liver transplant status: Principal | ICD-10-CM

## 2021-02-01 DIAGNOSIS — Z79899 Other long term (current) drug therapy: Principal | ICD-10-CM

## 2021-02-04 NOTE — Unmapped (Signed)
This TPA received a call from this patient on the [phone] line and we scheduled while on phone her Eval Kidney Tx Appointments.  Letter sent

## 2021-02-09 MED FILL — CYCLOSPORINE MODIFIED 25 MG CAPSULE: ORAL | 30 days supply | Qty: 120 | Fill #8

## 2021-02-09 NOTE — Unmapped (Signed)
[facility] Clinical Assessment & Refill Coordination Note    Katherine Norton, : 02/25/1956  Phone: There are no phone numbers on file.    All above HIPAA information was verified with patient.     Was a Nurse, learning disability used for this call? No    Specialty Medication(s):   Transplant: cyclosporine 25mg      Current Outpatient Medications   Medication Sig Dispense Refill   ??? acetaminophen (TYLENOL) 325 MG tablet 2 tablets (650 mg total) by G-tube route every six (6) hours as needed. (Patient taking differently: Take 650 mg by mouth every six (6) hours as needed (headaches). )  0   ??? albuterol 2.5 mg/0.5 mL nebulizer solution Inhale 0.5 mL (2.5 mg total) by nebulization every six (6) hours as needed for wheezing or shortness of breath. 30 each 12   ??? allopurinoL (ZYLOPRIM) 100 MG tablet Take 100 mg by mouth daily as needed.     ??? amLODIPine (NORVASC) 5 MG tablet Take 5 mg by mouth daily.     ??? calcitonin, salmon, (MIACALCIN) 200 unit/actuation nasal spray 1 spray by Alternating Nares route once daily.      ??? celecoxib (CELEBREX) 100 MG capsule TAKE 1 (ONE) CAPSULE BY MOUTH TWICE A DAY, MAX DAILY DOSE  2 CAPSULE     ??? cetirizine (ZYRTEC) 5 MG tablet TAKE 1 (ONE) TABLET BY MOUTH DAILY, MAX DAILY DOSE: 1 TABLET     ??? chlorhexidine (PERIDEX) 0.12 % solution 5 mL by Mouth route two (2) times a day. 1 Bottle 0   ??? cycloSPORINE modified (NEORAL) 25 MG capsule Take 2 capsules (50 mg total) by mouth Two (2) times a day. 360 capsule 3   ??? dextrose (D10W) 10% Soln bolus Infuse 125 mL into a venous catheter every thirty (30) minutes as needed.  0   ??? dextrose 5 % and sodium chloride 0.9 % (D5-NS) infu Infuse 25 mL/hr into a venous catheter continuous as needed (if tube feed held or NPO).  0   ??? doxercalciferol (HECTOROL IV) 8 mcg.     ??? famotidine (PEPCID) 20 MG tablet 1 tablet (20 mg total) by G-tube route daily. (Patient taking differently: 20 mg daily. ) 30 tablet 0   ??? gabapentin (NEURONTIN) 100 MG capsule Take by mouth.     ??? gentamicin 1 mg/mL, sodium citrate 4% Gent locks with dialysis 2.4 mL 4   ??? heparin sodium,porcine (HEPARIN, PORCINE,) 1,000 unit/mL 1000 unit/mL injection This heparin is for dialysis use ONLY. RN if you are unable to aspirate from both lumes, call provider piror to using the catheter. 1 mL 0   ??? heparin sodium,porcine (HEPARIN, PORCINE,) 5,000 unit/mL injection Inject 1 mL (5,000 Units total) under the skin every eight (8) hours. 1 mL 0   ??? levETIRAcetam (KEPPRA) 500 MG tablet TAKE 1 TABLET (500 MG TOTAL) BY MOUTH DAILY & 1 TABLET EVERY MONDAY, WEDNESDAY, AND FRIDAY AT 6 PM.     ??? loratadine (CLARITIN) 10 mg tablet Take 10 mg by mouth daily as needed.     ??? melatonin 3 mg Tab Take 1 tablet (3 mg total) by mouth nightly.  0   ??? metoprolol succinate (TOPROL-XL) 50 MG 24 hr tablet Take by mouth.     ??? ondansetron (ZOFRAN) 4 MG tablet TAKE 1 TABLET BY MOUTH EVERY EIGHT HOURS AS NEEDED NAUSEA/VOMITING     ??? oxyCODONE (ROXICODONE) 5 MG immediate release tablet 1 tablet (5 mg total) by G-tube  route every six (6) hours as needed. for up to 5 days (Patient taking differently: Take 5 mg by mouth every six (6) hours as needed. ) 10 tablet 0     No current facility-administered medications for this visit.        Changes to medications: patient not home with med list but says no recent changes    Allergies   Allergen Reactions   ??? Iodinated Contrast Media    ??? Ioxaglate Sodium    ??? Latex    ??? Succinylcholine Chloride      Other reaction(s): Other (See Comments)       Changes to allergies: No    SPECIALTY MEDICATION ADHERENCE     Cyclosporine 25mg   : 3 days of medicine on hand       Medication Adherence    Patient reported X missed doses in the last month: 0  Specialty Medication: cyclosporine 25mg           Specialty medication(s) dose(s) confirmed: Regimen is correct and unchanged.     Are there any concerns with adherence? No    Adherence counseling provided? Not needed    CLINICAL MANAGEMENT AND INTERVENTION      Clinical Benefit Assessment:    Do you feel the medicine is effective or helping your condition? Yes    Clinical Benefit counseling provided? Not needed    Adverse Effects Assessment:    Are you experiencing any side effects? No    Are you experiencing difficulty administering your medicine? No    Quality of Life Assessment:    How many days over the past month did your transplant  keep you from your normal activities? For example, brushing your teeth or getting up in the morning. 0    Have you discussed this with your provider? Not needed    Acute Infection Status:    Acute infections noted within Epic:  No active infections  Patient reported infection: None    Therapy Appropriateness:    Is therapy appropriate? Yes, therapy is appropriate and should be continued    DISEASE/MEDICATION-SPECIFIC INFORMATION      N/A    PATIENT SPECIFIC NEEDS     - Does the patient have any physical, cognitive, or cultural barriers? No    - Is the patient high risk? No    - Does the patient require a Care Management Plan? No     - Does the patient require physician intervention or other additional services (i.e. nutrition, smoking cessation, social work)? No      SHIPPING     Specialty Medication(s) to be Shipped:   Transplant: cyclosporine 25mg     Other medication(s) to be shipped: No additional medications requested for fill at this time     Changes to insurance: No    Delivery Scheduled: Yes, Expected medication delivery date: 02/10/2021.     Medication will be delivered via UPS to the confirmed prescription address in [facility].    The patient will receive a drug information handout for each medication shipped and additional FDA Medication Guides as required.  Verified that patient has previously received a Conservation officer, historic buildings and a Surveyor, mining.    All of the patient's questions and concerns have been addressed.    Thad Ranger   [facility] Specialty Pharmacist

## 2021-02-19 ENCOUNTER — Inpatient Hospital Stay: Payer: Medicare Other

## 2021-02-19 ENCOUNTER — Inpatient Hospital Stay: Payer: Medicare Other | Admitting: Family

## 2021-02-23 NOTE — Unmapped (Signed)
This TPA returned a call to this patient to reschedule her SW visit for 03/30/21 due to a dialysis day.  Letter sent

## 2021-02-25 ENCOUNTER — Institutional Professional Consult (permissible substitution): Admit: 2021-02-25 | Discharge: 2021-02-26 | Payer: MEDICARE | Attending: Health Service | Primary: Health Service

## 2021-03-01 DIAGNOSIS — Z79899 Other long term (current) drug therapy: Principal | ICD-10-CM

## 2021-03-01 DIAGNOSIS — Z944 Liver transplant status: Principal | ICD-10-CM

## 2021-03-04 ENCOUNTER — Ambulatory Visit: Admit: 2021-03-04 | Discharge: 2021-03-05 | Payer: MEDICARE

## 2021-03-05 NOTE — Unmapped (Addendum)
Pt inquiring if covid booster available at [facility], called and left pt a detailed vm re.availability and current CDC guidelines - pt needs 2 booster doses. Requested a return call to confirm receipt.    Pt returned call, she will call local pharmacies and check availability. If unsuccessful, will notify this TNC to schedule booster at [facility] or [place].

## 2021-03-10 MED FILL — CYCLOSPORINE MODIFIED 25 MG CAPSULE: ORAL | 30 days supply | Qty: 120 | Fill #9

## 2021-03-10 NOTE — Unmapped (Signed)
[facility] Refill Coordination Note    Specialty Medication(s) to be Shipped:   Transplant: cyclosporine 25mg     Other medication(s) to be shipped: No additional medications requested for fill at this time     Katherine Norton, DOB: 01/19/1956  Phone: There are no phone numbers on file.      All above HIPAA information was verified with patient.     Was a Nurse, learning disability used for this call? No    Completed refill call assessment today to schedule patient's medication shipment from the [facility] [phone]).  All relevant notes have been reviewed.     Specialty medication(s) and dose(s) confirmed: Regimen is correct and unchanged.   Changes to medications: Katherine Norton reports no changes at this time.  Changes to insurance: No  New side effects reported not previously addressed with a pharmacist or physician: None reported  Questions for the pharmacist: No    Confirmed patient received a Conservation officer, historic buildings and a Surveyor, mining with first shipment. The patient will receive a drug information handout for each medication shipped and additional FDA Medication Guides as required.       DISEASE/MEDICATION-SPECIFIC INFORMATION        N/A    SPECIALTY MEDICATION ADHERENCE     Medication Adherence    Patient reported X missed doses in the last month: 0  Specialty Medication: Cyclosporine  Patient is on additional specialty medications: No  Patient is on more than two specialty medications: No  Any gaps in refill history greater than 2 weeks in the last 3 months: no  Demonstrates understanding of importance of adherence: yes  Informant: patient              Were doses missed due to medication being on hold? No    Cyclosporine 25mg : Patient has 2 days of medication on hand     REFERRAL TO PHARMACIST     Referral to the pharmacist: Not needed      [facility]     Shipping address confirmed in Epic.     Delivery Scheduled: Yes, Expected medication delivery date: 5/19.     Medication will be delivered via UPS to the prescription address in Epic WAM.    Olga Millers   [facility] Specialty Technician

## 2021-03-22 MED ORDER — IRON SUCROSE 100 ML IVPB
0 days
Start: 2021-03-22 — End: 2022-03-14

## 2021-03-29 DIAGNOSIS — Z79899 Other long term (current) drug therapy: Principal | ICD-10-CM

## 2021-03-29 DIAGNOSIS — Z944 Liver transplant status: Principal | ICD-10-CM

## 2021-03-30 ENCOUNTER — Institutional Professional Consult (permissible substitution): Admit: 2021-03-30 | Payer: MEDICARE

## 2021-03-30 NOTE — Unmapped (Signed)
CSW called patient for her scheduled social work visit. Noted that this visit has been r/s a couple of times as patient did not have a caregiver present. Today she states she does have a caregiver present. Upon additional conversation, her caregiver present today is patient's personal care assistant, Faith, who provides housekeeping and some meal preparation for patient 10 hours per week is present. Faith notes she feels patient could benefit from more hours of support. Encouraged her to speak with her primary care physician, who provided orders for patient's personal care services at home, if she feels she needs increased hours of assistance.    Patient states her brother would be her primary caregiver after transplant and she would stay with him in [place], [place]. Discussed r/s social work visit when he could be present. She notes he brings her to most of her doctors appts and she would prefer in-person social work visit. This CSW also recommends in person social work visit to best evaluate patient needs. R/s social work visit for in person on 04/29/21.     Reminded patient of her appointment with Dr. Gwynneth Munson on 04/13/21. She was not aware and requested letter with updated appointments sent. Request sent to [facility].    Updated patient's TNC and transplant psychology of plan and to help with planning for any additional in person needs at [facility] on 04/29/21.       Evelena Asa, LCSW, CCTSW  Transplant Case Manager  [facility] for Transplant Care

## 2021-03-30 NOTE — Unmapped (Signed)
Velora Heckler, MSW  [facility] Desk Staff  Cc: Maryella Shivers  Front desk staff, please add patient to my schedule at 10:00 on 04/29/21 for IN PERSON visit. Please delete my noon slot.     Deb, can you please send patient a letter for this appt and also another letter for her visit with Dr. Gwynneth Munson? She missed this letter and requested another.     Thanks so much!   [name]     6/7 Updated letter sent.
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
## 2021-04-07 MED ORDER — MIRCERA INJ
0 days
Start: 2021-04-07 — End: 2022-04-06

## 2021-04-08 MED FILL — CYCLOSPORINE MODIFIED 25 MG CAPSULE: ORAL | 30 days supply | Qty: 120 | Fill #10

## 2021-04-08 NOTE — Unmapped (Signed)
[facility] Refill Coordination Note    Specialty Medication(s) to be Shipped:   Transplant: cyclosporine 25mg     Other medication(s) to be shipped: No additional medications requested for fill at this time     Katherine Norton, DOB: [birth year]  Phone: There are no phone numbers on file.      All above HIPAA information was verified with patient.     Was a Nurse, learning disability used for this call? No    Completed refill call assessment today to schedule patient's medication shipment from the [facility] [phone]).  All relevant notes have been reviewed.     Specialty medication(s) and dose(s) confirmed: Regimen is correct and unchanged.   Changes to medications: Daphney reports no changes at this time.  Changes to insurance: No  New side effects reported not previously addressed with a pharmacist or physician: None reported  Questions for the pharmacist: No    Confirmed patient received a Conservation officer, historic buildings and a Surveyor, mining with first shipment. The patient will receive a drug information handout for each medication shipped and additional FDA Medication Guides as required.       DISEASE/MEDICATION-SPECIFIC INFORMATION        N/A    SPECIALTY MEDICATION ADHERENCE     Medication Adherence    Patient reported X missed doses in the last month: 0  Specialty Medication: cycloSPORINE modified (NEORAL) 25 MG capsule  Patient is on additional specialty medications: No  Patient is on more than two specialty medications: No              Were doses missed due to medication being on hold? No    Cyclosporine 25 mg: 3 days of medicine on hand       REFERRAL TO PHARMACIST     Referral to the pharmacist: Not needed      [facility]     Shipping address confirmed in Epic.     Delivery Scheduled: Yes, Expected medication delivery date: 04/09/21.     Medication will be delivered via UPS to the prescription address in Epic WAM.    Nancy Nordmann [facility] Specialty Technician

## 2021-04-08 NOTE — Unmapped (Signed)
06/16 - Spoke with pt to schedule annual liver txp follow up, per conversation this tpa informed pt of changes in protocol for annual appts also discussed seeing new provider. Once pt confirmed date/time requested that a letter be mailed to her. Pt verbally agreed to all that was discussed.

## 2021-04-13 ENCOUNTER — Ambulatory Visit: Admit: 2021-04-13 | Discharge: 2021-04-14 | Payer: MEDICARE | Attending: Nephrology | Primary: Nephrology

## 2021-04-13 NOTE — Unmapped (Signed)
Transplant Nephrology Frailty Evaluation      Referring Physician   Trevor Iha, DO  [address]  [id number]  [place],  [place] [address]       History of Present Illness     The patient is a 65 y.o. female who presents for evaluation for potential kidney transplant. The patient reports she feels well today and denies active symptoms besides fatigue (usual for her on the day post dialysis). She is accompanied by her brother.        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 Per Dr. Tod Persia 03/19/20 Note: She has had CKD for a long time and was followed by Dr. Harrietta Guardian with a last f/u note in 2016.  Her creatinine was slowly increasing from a 1.1 in 2000 to 1.2 in 2014. During the hospital admission in 06/2018 she had to be initiated on dialysis. She was transferred from [facility] in 05/2018 for acute SOB and worsening M.S. She had multifocal PNA leading to ARDS. She was intubated and required tracheostomy and PEG tube placement. Her Tracheostomy was removed 06/2018 and PEG tube in 11/19. During the ICU admission she also had a PEA arrest and resuscitated for 1 minute.  On dialysis?: yes, iHD MWF via LUE AVF, started August 2019.  Dialysis Tolerance: She tolerates her treatments without difficulty, denies frequent hypotension or cramping. She is generally able to get to her dry weight. Her dry weight was recently reduced.   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 Continues on cyclosporine immunosuppression.    Prior blood product transfusions: yes, many years ago.   Pregnancy history: two pregnancies, two deliveries. Her liver began to enlarge during pregnancy and had gestational diabetes with first pregnancy, she denies having any other medical issues during either pregnancy.   Major Infections: multifocal pneumonia in 05/2018 requiring mechanical ventilation (See above). Otherwise patient denies (including chronic infections such as tuberculosis, HIV, and hepatitis)   Kidney stones:patient denies   Frequent UTI: patient denies   Urine output: anuric  History of Heart disease: patient denies   History of Lung disease: mild intermittent asthma, she saw pulmonology 12/2020 and had a moderate restriction and reduced DLCO on PFTs--they ordered a HRCT (report indicated no interstitial lung disease or bronchiectasis; mild emphysema and significant coronary artery calcification).   History of Liver disease:s/p OLT for cryptogenic cirrhosis   History of Diabetes Mellitus: (+) diagnosed in 1980, required insulin until she lost significant about of weight in 1997  History of Hypertension: (+) diagnosed in 1996  History of Peripheral Vascular Disease: patient denies. She had a CT Abd/Pelvis without contrast 05/14/2020, the radiology report from it noted extensive aortobiiliac calcifications.   History of Stroke: patient denies   History of Neurologic disease: (+) history of seizure--just one in 2019, continues on Keppra  History of Cancer: patient denies   History of Autoimmune disease: patient denies   History of Coagulation Disorder: patient denies   History of Urinary Tract Abnormalities: patient denies   Psychiatric History: patient denies but depression listed in chart   Other Significant Medical and Surgical History: chronic pancytopenia (has been evaluated by Heme Onc)      Screening/Preventative Medicine:  Last Colonoscopy: 2021  Last PAP: s/p hysterectomy in 1981  Last Mammogram: 2020, reportedly normal  Last Influenza Vaccine: Fall 2021  Last Pneumococcal Vaccine: she does not recall  COVID Vaccine: x4  Social History  Living situation: lives alone, no pets  Employment: unemployed, gets SSI. 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 Smoking status: Never Smoker   ??? Smokeless tobacco: Never Used   Substance Use Topics   ??? Alcohol use: No     Alcohol/week: 0.0 standard drinks   ??? Drug use: No         Past Medical History   Past Medical History:   Diagnosis Date   ??? Chronic kidney disease    ??? Cirrhosis (CMS-HCC)    ??? HTN (hypertension)          Past Surgical History   Past Surgical History:   Procedure Laterality Date   ??? BRONCHOSCOPY  06/03/2018        ??? HYSTERECTOMY     ??? 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 PR TRACHEOSTOMY, PLANNED Midline 06/26/2018    Procedure: TRACHEOSTOMY PLANNED (SEPART PROC);  Surgeon: Theodosia Blender, MD;  Location: MAIN OR [facility];  Service: ENT   ??? TONSILLECTOMY             Allergies   Allergies   Allergen Reactions   ??? Iodinated Contrast Media    ??? Ioxaglate Sodium    ??? Latex    ??? Succinylcholine Chloride      Other reaction(s): Other (See Comments)         Medications   Current Outpatient Medications   Medication Sig Dispense Refill   ??? acetaminophen (TYLENOL) 325 MG tablet 2 tablets (650 mg total) by G-tube route every six (6) hours as needed. (Patient taking differently: Take 650 mg by mouth every six (6) hours as needed (headaches).)  0   ??? albuterol 2.5 mg/0.5 mL nebulizer solution Inhale 0.5 mL (2.5 mg total) by nebulization every six (6) hours as needed for wheezing or shortness of breath. 30 each 12   ??? allopurinoL (ZYLOPRIM) 100 MG tablet Take 100 mg by mouth daily as needed.     ??? amLODIPine (NORVASC) 5 MG tablet Take 5 mg by mouth daily.     ??? calcitonin, salmon, (MIACALCIN) 200 unit/actuation nasal spray 1 spray by Alternating Nares route once daily.      ??? celecoxib (CELEBREX) 100 MG capsule TAKE 1 (ONE) CAPSULE BY MOUTH TWICE A DAY, MAX DAILY DOSE  2 CAPSULE     ??? cetirizine (ZYRTEC) 5 MG tablet TAKE 1 (ONE) TABLET BY MOUTH DAILY, MAX DAILY DOSE: 1 TABLET     ??? cycloSPORINE modified (NEORAL) 25 MG capsule Take 2 capsules (50 mg total) by mouth Two (2) times a day. 360 capsule 3   ??? dextrose (D10W) 10% Soln bolus Infuse 125 mL into a venous catheter every thirty (30) minutes as needed.  0   ??? dextrose 5 % and sodium chloride 0.9 % (D5-NS) infu Infuse 25 mL/hr into a venous catheter continuous as needed (if tube feed held or NPO).  0   ??? doxercalciferol (HECTOROL IV) 8 mcg.     ??? famotidine (PEPCID) 20 MG tablet 1 tablet (20 mg total) by G-tube route daily. (Patient taking differently: 20 mg  in the morning.) 30 tablet 0   ??? fluticasone propionate (FLONASE ALLERGY RELIEF) 50 mcg/actuation nasal spray 1 spray into each nostril.     ??? food supplemt, lactose-reduced (  ENSURE) Liqd Take 120 mL by mouth.     ??? gabapentin (NEURONTIN) 100 MG capsule Take 100 mg by mouth as needed in the morning.     ??? gentamicin 1 mg/mL, sodium citrate 4% Gent locks with dialysis 2.4 mL 4   ??? heparin sodium,porcine (HEPARIN, PORCINE,) 1,000 unit/mL 1000 unit/mL injection This heparin is for dialysis use ONLY. RN if you are unable to aspirate from both lumes, call provider piror to using the catheter. 1 mL 0   ??? heparin sodium,porcine (HEPARIN, PORCINE,) 5,000 unit/mL injection Inject 1 mL (5,000 Units total) under the skin every eight (8) hours. 1 mL 0   ??? iron sucrose 100 mg, OVERFILL 10 mL in sodium chloride 0.9 % 100 mL IVPB 50 mg.     ??? levETIRAcetam (KEPPRA) 500 mg/100 mL PgBk 1 tablet.     ??? lipase/protease/amylase (PANCRELIPASE 16,000 ORAL) Pancrelipase 24000-76000     ??? loratadine (CLARITIN) 10 mg tablet Take 10 mg by mouth daily as needed.     ??? melatonin 3 mg Tab Take 1 tablet (3 mg total) by mouth nightly.  0   ??? methoxy peg-epoetin beta (MIRCERA INJ) 60 mcg.     ??? metoprolol succinate (TOPROL-XL) 50 MG 24 hr tablet 50 mg  in the morning.     ??? ondansetron (ZOFRAN) 4 MG tablet TAKE 1 TABLET BY MOUTH EVERY EIGHT HOURS AS NEEDED NAUSEA/VOMITING     ??? oxyCODONE (ROXICODONE) 5 MG immediate release tablet 1 tablet (5 mg total) by G-tube route every six (6) hours as needed. for up to 5 days (Patient taking differently: Take 5 mg by mouth every six (6) hours as needed.) 10 tablet 0   ??? vitamin B complx-C-FA-zinc cit (DIALYVITE 800 WITH ZINC 15) 0.8-15 mg Tab Take by mouth.     ??? chlorhexidine (PERIDEX) 0.12 % solution 5 mL by Mouth route two (2) times a day. 1 Bottle 0   ??? vitamin E, dl, acetate, 16.1 mg (50 unit)/mL Drop Take 1 capsule by mouth.       No current facility-administered medications for this visit.         Family History   Family History   Problem Relation Age of Onset   ??? Hypertension Mother    ??? Hypertension Father    ??? Prostate cancer Father    ??? Lupus Sister    ??? Lupus Brother    ??? Kidney disease Neg Hx    Brother also has prostate cancer.     Denies family history of kidney disease      Review of Systems     no chest pain, no shortness of breath, no headaches, no nausea, no vomiting, no abdominal pain, no dyspnea with exertion, no light-headedness, no loss of consciousness, no difficulty swallowing, no fevers, no chills, no diarrhea, no blood in stool, no pain urinating, no blood in urine, no sensation of incomplete bladder emptying.     Otherwise as per HPI. All other systems are reviewed and are negative.      Frailty evaluation:    She denies hospitalizations or falls in the last year.    She does use a cane, but not a walker or wheelchair to assist with ambulation. She does not do all of her ADLs independently. (+) She has a caregiver come to help every day--they help with cleaning, cooking, help bathing, laundry. She has needed this help since she was discharged from the hospital in 2019 and due to significant fatigue  after dialysis. She last completed a course of physical therapy in 2019 after discharge. She does sometimes drive.    (Each is 0-1 points)  1. Her weight has been stable over the last year. (yes, 0 points--gained about 10 lbs since last year)  2. She reports symptoms of exhaustion (How many days out of the last week where you felt everything was an effort?). (every day, needs help with ADLs, 1 points)  3. Other Activities besides ADLs (goes for walks about twice per week, but no other extra activity 0 points)  4. Her gait speed is  Normal, used cane very lightly with walk in hallway. (0 points)  5. Her grip strength is  normal. (0 points)    Her frailty score is 1, indicating she is pre-frail        Physical Exam     BP 123/74 (BP Site: R Arm, BP Position: Sitting, BP Cuff Size: Medium)  - Pulse 71  - Temp 37.1 ??C (98.8 ??F) (Tympanic)  - Ht 152.4 cm (5')  - Wt 54.7 kg (120 lb 9.6 oz)  - BMI 23.55 kg/m??   General: no acute distress   HEENT: mucous membranes moist  Dentition: edentulous  Neck: neck supple, no cervical lymphadenopathy appreciated   CV: normal rate, normal rhythm, no murmur appreciated   Lungs: clear to auscultation bilaterally   Abdomen: soft, non tender   Extremities: no edema, LUE AVF with bruit and thrill  Musculoskeletal: no visible deformity, normal range of motion.   Pulses: intact distally throughout   Neurologic: awake, alert, and interactive, no gross neurological deficit          Laboratory Results and Imaging Data Reviewed in EPIC      We reviewed renal transplantation concepts with the patient in detail, including:   1) Kidney donor types including living donors, ABO incompatible, deceased donors, high KDPI deceased donors, and HCV positive deceased donors. We discussed the benefits of a living donor, particularly in older patients as it my facilitate transplant earlier. She was encouraged to have any potential donors contact our donor coordinator.  We discussed the average waiting time for a deceased donor kidney. We discussed that even if she is listed for transplant, she may develop a medical problem while on the waiting list that will make her too high risk for transplant prior to receiving a deceased donor kidney.  2) Increase risk of diabetes post transplant.   3) Increase risk of infections post surgery.   4) Increase risk of cancers.   5) Increase risk of heart attack especially during the first 3 months after surgery.   6) The possibility of needing dialysis post transplant.   7) Need for a kidney biopsy post transplant and treatment with medications as required.   8) Importance of being compliant with the medications and follow up appointment with physicians and other members of the transplant team.   9) Side effects of immunosuppression.   10) Recurrent disease.         Assessment:    65 y.o. 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 She is pre-frail by my score today. Her nearest family member that can help lives in [place] (brother). She does not have any potential living donors. Clinical concerns include: aortoiliac atherosclerotic disease, pre-frailty requiring home aid to assist with tasks such as bathing.  Recommendations/Plan:  -Ask Transplant Surgery to review 05/14/20 CT Abd/Pelvis to determine if there are acceptable vascular targets for implantation. If there are acceptable targets and the patient has an appropriate post-transplant care-giving plan, would proceed with additional evaluation including echocardiogram, ECG, and stress testing.  -Encouraged Physical Therapy.   -Age appropriate cancer screening including: mammography when due.  -Transplant Social Work evaluation      Clelia Croft, DO  Division of Nephrology and Hypertension  [facility]  04/13/2021  8:49 AM

## 2021-04-16 NOTE — Unmapped (Signed)
Returned pt's vm inquiring about 5th shot for covid. Discussed current guidelines for immunocompromised population. Pt received 1st booster dose locally ([facility]) on 03/13/21. She will be eligible for the 2nd booster on 07/14/21. Pt very appreciative and verbalized understanding.

## 2021-04-26 DIAGNOSIS — Z79899 Other long term (current) drug therapy: Principal | ICD-10-CM

## 2021-04-26 DIAGNOSIS — Z944 Liver transplant status: Principal | ICD-10-CM

## 2021-04-29 ENCOUNTER — Institutional Professional Consult (permissible substitution): Admit: 2021-04-29 | Discharge: 2021-04-30 | Payer: MEDICARE

## 2021-04-29 NOTE — Unmapped (Signed)
PATIENT NAME: Katherine Norton   MR#: [id number]    DOB: [birth year]      [facility]  CONFIDENTIAL SOCIAL WORK  KIDNEY TRANSPLANT ASSESSMENT         DATE OF EVALUATION: 04/29/2021    INFORMANTS: Katherine Norton, family/brother, Katherine Norton    PREFERRED LANGUAGE: English     TRANSPLANT PHASE:  Evaluation   TRANSPLANT STATUS:  Active since 03/19/20    The limits of confidentiality and the purpose of the evaluation were reviewed. The patient was provided with a verbal description of the nature and purpose of the social work evaluation. I also reviewed the referral source, specific referral question for this evaluation, foreseeable risks/discomforts, benefits, limits of confidentiality, and mandatory reporting requirements of this provider. The patient was given the opportunity to ask questions and receive answers about the present evaluation. Oral consent was provided by the patient.     PRESENTING MEDICAL PROBLEMS AND RELEVANT HISTORY:   Katherine Norton is a 65 y.o. African American female who  has a past medical history of Chronic kidney disease, Cirrhosis (CMS-HCC), and HTN (hypertension).     Patient denies recent hospitalizations or changes to health.     FUNCTIONAL STATUS:   Katherine Norton presents as receiving help with personal ADLs, including cooking and household chores. She states she is able to bathe and dress herself.    Patient is receiving Medicaid personal care services. Her physician thought she needed help with household chores due to medical status and referred her to this program.      DME: uses cane at home and brought to clinic; has rollator walker as well, no additional medical equipment at home     Activity level/Functional Status: parks car farther away when she goes to grocery store     UNDERSTANDING OF MEDICAL CONDITION AND TRANSPLANT:   Katherine Norton and her brother attended the Transplant Orientation in May 2021.    Patient verbalizes understanding that last hospitalization where I got sick caused her kidney disease.    Pt understands medical illness process: well  Pt understands transplant process/psychosocial risks:well   Education provided: orientation class information regarding transplant process and guidelines were reviewed, including psychosocial risks.    SUPPORT PLAN FOR TRANSPLANTATION:     Plan details: Patient plans to recover under the care of her brother, Katherine Norton, who states he could stay with her at her home. She notes her sisters can be back-up supports, Katherine Norton (in early 18s) and Katherine Norton (in early 70s). Both are ambulatory and driving. Katherine Norton lives in [place], [place].     Patient realistic in realizing her brother needs a break and will need assistance with caregiving responsibilities. Her brother  presents as a committed, caring support to her.     Primary Support/ Relationship to patient: Katherine Norton  Age/DOB: 18  Understanding of medical directions: feels comfortable  Employment: retired, from drove trucks for 30 years    Support limitations: no health concerns that that would interfere with caregiving  Support strengths: historical caregiver, health literate, access to reliable vehicle , comfortable driving to Katherine Norton and no other CG responsibilities, notes has been a caregiver for many family members, lives in [place]     Back-up Support/ Relationship to patient: two sisters, Katherine Norton and Katherine Norton (will need to speak with one of them after patient completes remainder of requirements for transplant evaluation)    Back-up Support: Katherine Norton (limited back-up support, does travel for work, but anticipated to be able  to assist for a week)    Advance Directive: Education and form were provided.    DIALYSIS HISTORY:   Dialysis History      Start End Type Center Comments    07/23/2018  In-center Hemodialysis [facility] [place]           Current Dialysis Center Information     [facility] [place]     Phone: [phone] Fax: [phone]    Address:  [address]  [place] [address]                      Patient runs for 3.5 hours on a M/W/F 1st shift schedule.Patient states that she has been compliant with all dialysis recommendations and provided verbal permission for this CSW to contact her dialysis center.  Patient states she is is committed to completing her dialysis treatments.     Transportation:   Self: does drive locally   Dialysis:uses Medicaid transportation   Distance from Home to [facility]: [address] Katherine Norton  [place] [address]    ATTITUDE ABOUT TRANSPLANT:   Expectations: feel great about it, I see patients at dialysis getting weaker, and states she knows there are risks to transplant, however, she is hopeful for freedom from dialysis   Fears/Concerns: no    SOCIAL HISTORY:   Citizenship Status: [name] Citizen   Personal History: [place] native   Marital Status: divorced   Lives with: alone   Children/Dependents:  Katherine Norton (daughter)  Social support: daughter, brother   Housing: trying to find another apartment, notes she is displease with finding a snail in her kitchen   She has called exterminator and sees a snail every 6 months, indicating a patient preference to move, not     COMPLIANCE HISTORY:   Problems obtaining medications or attending appointments (including transportation): denies, take Medicaid transportation  Problems organizing medications: denies, organizes self, uses pillbox, consistent with taking medications    EDUCATION AND WORK HISTORY:   Highest completed grade level: a little college   Currently employed: no  Last employment: 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 History: none      MEDICAL/PRESCRIPTION COVERAGE:   [employer] assistance:no  Payer/Plan Subscriber Name Rel Member # Group #   MEDICARE - MEDICARE [place], Katherine Norton Self [id number]       [address]   MEDICAID [place] - MEDICAI* LOGYN, DEDOMINICIS* Self [id number]       [id number]       FINANCIAL RESOURCES:   Current income sources: SSI, food stamps Monthly expenses: rent (Section 8 housing), usual customary expenses  Financial Risks and Debts: none  Current income meets basic needs: yes, has been managing sucesssfully since liver transplant over 25 years ago      STRESSORS/COPING STYLE:  Patient sates dialysis is a current stressor. Patient enjoys going to places with her grandson (age 38); notes upcoming visit with going to zoo, reading, cooking, and faith is source of support.     Religious/Spiritual Beliefs:  [employer]    PSYCHIATRIC HISTORY:   Current issues/mood: fine mood, stable, brother notes dialysis is draining and this is frustrating to her  Past issues: depression  Medications: denies presently  Therapy: states saw a therapist in April 2022 (saw her every week for maybe 4-5 times, found it helpful), saw for grief/loss, currently receiving pastoral counseling and states she fees more comfortable with continuing with pastoral  counseling   SI/HI: denies   Hospitalizations: denies   Loss/Trauma/Violence: grief/loss of son, denies additional concerns of trauma/violence   Mania: no  Psychosis: none   Hx of adverse reactions to treatment/medication/steroids: not assessed   PHQ-2 Score: 0     GAD-2 Score: 1        They agreed to discuss any changes in the patient's mood or behavior with the medical team.    MENTAL STATUS:   Affect: normal, mood congruent  Appearance: well groomed hair, well dressed, season and temperature appropriate  Attention Span: normal attention span  Attitude: friendly, cooperative, interested, attentive  Behavior: calm, cooperative, appropriate eye contact, no psychomotor agitation/retardation noted  Insight & Judgement: intact/appropriate, reliable insight  Level of Consciousness: alert  Mood: euthymic/normal/stable  Orientation: person, place, time, date  Speech: normal speech  Thought Content: logical connections    COGNITIVE HISTORY & HEALTH LITERACY:   Ms. Moroni does  seem cognitively intact. She denies history of memory or cognitive concerns related to her health. There were not concerns for health literacy.     Do you need to have someone help you when you read instructions, pamphlets, or other written material from your doctor or pharmacy? No    Hx of special education or learning challenges: none     SUBSTANCE HISTORY:   Tobacco: Katherine Norton  reports that she has never smoked. She has never used smokeless tobacco.  Alcohol: Katherine Norton  reports no history of alcohol use.  Illicit Substances: Katherine Norton  reports no history of drug use.    CHRONIC PAIN HISTORY (and referral needs): Patient takes oxycodone 5 mg, only when needed, once every few days, lower back pain, followed by local pain clinic     LEGAL HISTORY:   Ms. Asselin denies current or past history of legal issues.    A review of [place] DPS Offender Public Information website revealed no history of criminal charges.    COLLATERAL CONTACT:  Katherine Norton was interviewed with her brother for the beginning of the interview and alone for more sensitive questions.    Called patient's dialysis center on 05/11/21 and spoke with RN there. Dialysis staff denies concerns about adherence to complex regimen, consistent attendance to dialysis, medication adherence and relationships with staff.       EDUCATION:  KIDNEY Transplant Process  Psychosocial risks of transplant, including depression, anxiety, guilt, PTSD  CKD/ESRD can often cause stress on the entire family unit, not just the patient  Advance Care Planning  Long-term financial and vocational planning  Expectations for support planning both Katherine- and post-transplant  Transplant social worker availability throughout transplant process  Engineer, manufacturing systems Forms  Kidney Transplant Patients and their Support Persons    ASSESSMENT AND RECOMMENDATIONS:     Earlie Arciga is a 65 y.o. female who presents for kidney transplant evaluation. Patient is cooperative and engaged throughout the assessment. Patient is a previous liver transplant recipient, almost 26 years ago. Patient presents as motivated for transplant.     Patient has a number of psychosocial strengths, including motivation for transplant, adherence to medical treatment, adequate financial resources  and denial of substance abuse concerns. Patient reports stable mental health. She has a hx of depression and states she saw a therapist in April 2022 4-5 times. She saw this provider for grief/loss. She shares she is now currently receiving pastoral counseling and states she fees more comfortable with continuing with pastoral counseling  Patient has some limitations  to her functional status and receives Medicaid personal care services. She is presently able to dress and bathe herself, and receives assistance with light housekeeping tasks at home. Patient lives alone at home. Post-transplant, patient plans to recover under the care of her brother, Katherine Norton, who states he could stay with her at her home. She notes her sisters can be back-up supports, Katherine Norton (in early 63s) and Katherine Norton (in early 70s). Both are ambulatory and driving. Katherine Norton lives in [place], [place]. Patient is realistic in recognizing that her brother needs a break and will need assistance with caregiving responsibilities. Her brother  presents as a committed, caring support to her. Patient reports adherence to her medical regimen, which is corroborated by her dialysis center.     SIPAT SCORE: 15    Final decision regarding listing status is based upon committee review at selection meeting.    Recommendations and Plan:   1. Speak with an additional caregiver by phone prior to consideration for listing  2. Annual SW visits and close monitoring of caregiving plan  3. Patient follow transplant psychology recommendations re: chronic pain and mental health needs  4. Encouraged fundraising  5. Consider advance directive  CSW to provide continued support and encouragement.    Evelena Asa, LCSW, CCTSW  Transplant Case Manager  [facility]

## 2021-05-03 DIAGNOSIS — Z944 Liver transplant status: Principal | ICD-10-CM

## 2021-05-04 ENCOUNTER — Telehealth: Payer: Self-pay | Admitting: Family Medicine

## 2021-05-04 NOTE — Telephone Encounter (Signed)
Tried calling pt again. Went to VM again, unable to LVM

## 2021-05-04 NOTE — Telephone Encounter (Signed)
Tried calling pt back. Went to VM, unable to leave message

## 2021-05-04 NOTE — Telephone Encounter (Signed)
Pt called stating she is shaking and cannon control it. Requesting a call back on what she should do to help with this.

## 2021-05-07 MED FILL — CYCLOSPORINE MODIFIED 25 MG CAPSULE: ORAL | 30 days supply | Qty: 120 | Fill #11

## 2021-05-07 NOTE — Unmapped (Signed)
[facility] Refill Coordination Note    Specialty Medication(s) to be Shipped:   Transplant: cyclosporine 25mg     Other medication(s) to be shipped: No additional medications requested for fill at this time     Katherine Norton, DOB: [birth year]  Phone: There are no phone numbers on file.      All above HIPAA information was verified with patient.     Was a Nurse, learning disability used for this call? No    Completed refill call assessment today to schedule patient's medication shipment from the [facility] [phone]).  All relevant notes have been reviewed.     Specialty medication(s) and dose(s) confirmed: Regimen is correct and unchanged.   Changes to medications: Katherine Norton reports no changes at this time.  Changes to insurance: No  New side effects reported not previously addressed with a pharmacist or physician: None reported  Questions for the pharmacist: No    Confirmed patient received a Conservation officer, historic buildings and a Surveyor, mining with first shipment. The patient will receive a drug information handout for each medication shipped and additional FDA Medication Guides as required.       DISEASE/MEDICATION-SPECIFIC INFORMATION        N/A    SPECIALTY MEDICATION ADHERENCE     Medication Adherence    Patient reported X missed doses in the last month: 0  Specialty Medication: cycloSPORINE modified (NEORAL) 25 MG capsule  Patient is on additional specialty medications: No              Were doses missed due to medication being on hold? No    Cyclosporine 25 mg: 3 days of medicine on hand       REFERRAL TO PHARMACIST     Referral to the pharmacist: Not needed      [facility]     Shipping address confirmed in Epic.     Delivery Scheduled: Yes, Expected medication delivery date: 05/10/21.     Medication will be delivered via UPS to the prescription address in Epic WAM.    Katherine Norton [facility] Specialty Technician

## 2021-05-20 ENCOUNTER — Ambulatory Visit: Admit: 2021-05-20 | Discharge: 2021-05-21 | Payer: MEDICARE

## 2021-05-20 DIAGNOSIS — Z944 Liver transplant status: Principal | ICD-10-CM

## 2021-05-20 NOTE — Unmapped (Signed)
Eat eat every 2-3 hours a high quality snacks, like eggs, nuts, cheese sticks, avocado (guacamole)   This will fuel your body and give you mor energy and keep you from getting weak and frail.

## 2021-05-20 NOTE — Unmapped (Signed)
FOLLOW UP ANNUAL LIVER CLINIC NOTE     Patient Name: Katherine Norton  Medical Record Number: [id number]  Date of Service: 05/20/2021    Referring Physician: [facility] *   Current complaint: Follow up Annual Liver    Assessment/Plan:     Katherine Norton is a 65 y.o. 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 Recent LFT testing/images WNL.  Immunosuppressive monotherapy goal (40-80). Continue neoral 50mg  BID, repeat trough, presumed not a true trough.    Hoping to be find a donor kidney transplant.    Today we discussed nutrition at length as a means of preventing frailty.         HEALTH MAINTENANCE:   Immunization History   Administered Date(s) Administered   ??? COVID-19 VACCINE,MRNA(MODERNA)(PF)(IM) 12/23/2019, 01/23/2020, 09/11/2020   ??? HEPB-CPG,ADULT DOSAGE ADJUVANTED, IM 12/11/2020, 01/06/2021   ??? Hepatitis B, Adult 08/24/2018, 09/24/2018, 10/26/2018, 02/22/2019, 05/22/2019, 06/21/2019, 07/19/2019, 11/22/2019   ??? Influenza Vaccine Quad (IIV4 PF) 91mo+ injectable 07/06/2017, 07/26/2019   ??? Influenza Virus Vaccine, unspecified formulation 07/01/2015, 08/22/2016, 07/06/2017, 07/26/2019   ??? Influenza, High Dose (IIV4) 65 yrs & older 08/21/2020   ??? PNEUMOCOCCAL POLYSACCHARIDE 23 08/22/2018, 09/03/2018   ??? Pneumococcal Conjugate 13-Valent 07/06/2017   ??? SHINGRIX-ZOSTER VACCINE (HZV), RECOMBINANT,SUB-UNIT,ADJUVANTED IM 01/15/2018, 05/14/2020   Shingrix vaccine series completed    Return to clinic: 1 year  Labs: Q 6-8 weeks    I personally spent 35 minutes face-to-face and non-face-to-face in the care of this patient, which includes all pre, intra, and post visit time on the date of service. Greater than 50% of the time was spent on counseling and the substance of the discussion.      Subjective:     HPI: Katherine Norton is a 65 y.o. 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 She returns today for her annual follow up. 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 She currently dialyizes via L upper arm AVF and doesn't make urine. She is tolerating HD well and has few changes to health over the last year but reports ongoing weight loss.    Denies fever, chills, arthralgias, and fatigue. Denies chest pain, SOB, N/V/D, constipation or abdominal pain. No acute complaint today.    Past Medical History:   Diagnosis Date   ??? Chronic kidney disease    ??? Cirrhosis (CMS-HCC)    ??? HTN (hypertension)        Past Surgical History:   Procedure Laterality Date   ??? BRONCHOSCOPY  06/03/2018        ??? HYSTERECTOMY     ??? 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 PR TRACHEOSTOMY, PLANNED Midline 06/26/2018    Procedure: TRACHEOSTOMY PLANNED (SEPART PROC);  Surgeon: Theodosia Blender, MD;  Location: MAIN OR [facility];  Service: ENT   ??? TONSILLECTOMY         Family History   Problem Relation Age of Onset   ??? Hypertension Mother    ??? Hypertension Father    ??? Prostate cancer Father    ??? Lupus Sister    ??? Lupus Brother    ??? Kidney disease Neg Hx        Social History     Socioeconomic History   ??? Marital status: Single   Tobacco  Use   ??? Smoking status: Never Smoker   ??? Smokeless tobacco: Never Used   Substance and Sexual Activity   ??? Alcohol use: No     Alcohol/week: 0.0 standard drinks   ??? Drug use: No       REVIEW OF SYSTEMS:   The balance of 10/12 systems is negative with the exception of HPI.    Objective:     MEDICATIONS:  Allergies   Allergen Reactions   ??? Iodinated Contrast Media    ??? Ioxaglate Sodium    ??? Latex    ??? Succinylcholine Chloride      Other reaction(s): Other (See Comments)       Current Outpatient Medications   Medication Sig Dispense Refill   ??? acetaminophen (TYLENOL) 325 MG tablet 2 tablets (650 mg total) by G-tube route every six (6) hours as needed. (Patient taking differently: Take 650 mg by mouth every six (6) hours as needed (headaches).)  0   ??? allopurinoL (ZYLOPRIM) 100 MG tablet Take 100 mg by mouth daily as needed.     ??? amLODIPine (NORVASC) 5 MG tablet Take 5 mg by mouth daily.     ??? calcitonin, salmon, (MIACALCIN) 200 unit/actuation nasal spray 1 spray by Alternating Nares route once daily.      ??? celecoxib (CELEBREX) 100 MG capsule TAKE 1 (ONE) CAPSULE BY MOUTH TWICE A DAY, MAX DAILY DOSE  2 CAPSULE     ??? cetirizine (ZYRTEC) 5 MG tablet TAKE 1 (ONE) TABLET BY MOUTH DAILY, MAX DAILY DOSE: 1 TABLET     ??? cycloSPORINE modified (NEORAL) 25 MG capsule Take 2 capsules (50 mg total) by mouth Two (2) times a day. 360 capsule 3   ??? dextrose (D10W) 10% Soln bolus Infuse 125 mL into a venous catheter every thirty (30) minutes as needed.  0   ??? dextrose 5 % and sodium chloride 0.9 % (D5-NS) infu Infuse 25 mL/hr into a venous catheter continuous as needed (if tube feed held or NPO).  0   ??? doxercalciferol (HECTOROL IV) 8 mcg.     ??? fluticasone propionate (FLONASE) 50 mcg/actuation nasal spray 1 spray into each nostril.     ??? food supplemt, lactose-reduced (ENSURE) Liqd Take 120 mL by mouth.     ??? gabapentin (NEURONTIN) 100 MG capsule Take 100 mg by mouth as needed in the morning.     ??? gentamicin 1 mg/mL, sodium citrate 4% Gent locks with dialysis 2.4 mL 4   ??? heparin sodium,porcine (HEPARIN, PORCINE,) 1,000 unit/mL 1000 unit/mL injection This heparin is for dialysis use ONLY. RN if you are unable to aspirate from both lumes, call provider piror to using the catheter. 1 mL 0   ??? heparin sodium,porcine (HEPARIN, PORCINE,) 5,000 unit/mL injection Inject 1 mL (5,000 Units total) under the skin every eight (8) hours. 1 mL 0   ??? iron sucrose 100 mg, OVERFILL 10 mL in sodium chloride 0.9 % 100 mL IVPB 50 mg.     ??? levETIRAcetam (KEPPRA) 500 mg/100 mL PgBk 1 tablet.     ??? lipase/protease/amylase (PANCRELIPASE 16,000 ORAL) Pancrelipase 24000-76000     ??? loratadine (CLARITIN) 10 mg tablet Take 10 mg by mouth daily as needed.     ??? melatonin 3 mg Tab Take 1 tablet (3 mg total) by mouth nightly.  0   ??? methoxy peg-epoetin beta (MIRCERA INJ) 60 mcg.     ??? metoprolol succinate (TOPROL-XL) 50 MG 24 hr tablet 50 mg  in the  morning.     ??? ondansetron (ZOFRAN) 4 MG tablet TAKE 1 TABLET BY MOUTH EVERY EIGHT HOURS AS NEEDED NAUSEA/VOMITING     ??? vitamin B complx-C-FA-zinc cit (DIALYVITE 800 WITH ZINC 15) 0.8-15 mg Tab Take by mouth.     ??? vitamin E, dl, acetate, 16.1 mg (50 unit)/mL Drop Take 1 capsule by mouth.     ??? albuterol 2.5 mg/0.5 mL nebulizer solution Inhale 0.5 mL (2.5 mg total) by nebulization every six (6) hours as needed for wheezing or shortness of breath. 30 each 12   ??? famotidine (PEPCID) 20 MG tablet 1 tablet (20 mg total) by G-tube route daily. (Patient taking differently: 20 mg  in the morning.) 30 tablet 0   ??? oxyCODONE (ROXICODONE) 5 MG immediate release tablet 1 tablet (5 mg total) by G-tube route every six (6) hours as needed. for up to 5 days (Patient taking differently: Take 5 mg by mouth every six (6) hours as needed.) 10 tablet 0     No current facility-administered medications for this visit.         PHYSICAL EXAM:  BP 95/64  - Pulse 69  - Temp 36.6 ??C (97.8 ??F) (Temporal)  - Resp 16  - Wt 53.9 kg (118 lb 12.8 oz)  - SpO2 93%  - BMI 23.20 kg/m??     General Appearance:  NAD, well appearing but thin   HEENT:  [place]/AT. Well hydrated moist mucous membranes of the oral cavity. No scleral icterus. No cervical lymphadenopathy.   Pulmonary:    Normal respiratory effort. CTAB, without wheezes/crackles/rhonchi. Good air movement.    Cardiovascular:  Regular rate and rhythm, no murmur noted.   Extremities No edema. Ecchymosis to LUE with swelling to LUE above AVF   Abdomen:   Normoactive bowel sounds, abdomen soft, non-tender and not distended, no Hepatosplenomegaly or masses. Abdominal scar well healed without hernia.    Musculoskeletal: No joint tenderness, full ROM. Normal gait.    Skin: Skin color, texture, turgor normal, no rashes or lesions.   Neurologic: Grossly intact.   Psychiatric: Judgement and insight appropriate.        LAB RESULTS:  All lab results last 24 hours:    No results found for this or any previous visit (from the past 48 hour(s)).      IMAGING Personally reviewed   CT Abdomen Pelvis Wo Contrast  Result Date: 05/14/2020  -Extensive aortobiiliac calcifications. Mild calcifications of the distal external iliac vasculature. Scattered calcifications of the common femoral arteries and internal iliac bilaterally.   -Sequelae of liver transplantation. Diffuse pneumobilia.   -Small atrophic native kidneys compatible with chronic medical renal disease. Nonobstructing left nephrolithiasis.     XR Chest 2 views  Result Date: 05/14/2020  No acute findings.    US Liver Transplant  Result Date: 05/14/2020  --Patent hepatic transplant vasculature.   --Resistive indices within the common hepatic arteries are stable to slightly increased compared to prior, within normal limits. Recommend attention on follow-up.   --Decreased phasicity of the hepatic veins and IVC, findings may be related to venoocclusive disease however favor technical issues.   --The transplant liver is mildly enlarged and demonstrates increased echogenicity.

## 2021-05-21 DIAGNOSIS — Z944 Liver transplant status: Principal | ICD-10-CM

## 2021-05-21 DIAGNOSIS — Z79899 Other long term (current) drug therapy: Principal | ICD-10-CM

## 2021-05-21 MED ORDER — CYCLOSPORINE MODIFIED 25 MG CAPSULE
ORAL_CAPSULE | Freq: Two times a day (BID) | ORAL | 3 refills | 90 days | Status: CP
Start: 2021-05-21 — End: 2022-05-21
  Filled 2021-06-02: qty 360, 90d supply, fill #0

## 2021-05-21 NOTE — Unmapped (Signed)
Incorrect lab values entered on 05/19/21 from dialysis center: hemoglobin 27 / hematocrit 30.  Spoke to nurse at [facility].  She confirmed that labs were drawn at dialysis on 05/19/21 and correct values are: hemoglobin 9 / hematocrit 27.  Called [facility] and incident was opened to correct these values in Epic. Incident # [id number].

## 2021-05-21 NOTE — Unmapped (Signed)
Pt seen in [facility] yesterday by NP [facility] for annual post liver txp follow up. Medication list reviewed, CsA rx renewed. Pt reports she plans to follow NP Metheny's dietary recs, stated my baby brother took me shopping after my appointment. She is currently at a local vascular center d/to fistula not working right per pt. Called [facility] and spoke to [place], asked for lab results to be faxed to [facility].

## 2021-05-24 DIAGNOSIS — Z944 Liver transplant status: Principal | ICD-10-CM

## 2021-05-24 DIAGNOSIS — Z79899 Other long term (current) drug therapy: Principal | ICD-10-CM

## 2021-06-01 NOTE — Unmapped (Signed)
[facility] Refill Coordination Note    Specialty Medication(s) to be Shipped:   Transplant: cyclosporine 25mg     Other medication(s) to be shipped: No additional medications requested for fill at this time     Katherine Norton, DOB: 16-May-1956  Phone: There are no phone numbers on file.      All above HIPAA information was verified with patient.     Was a Nurse, learning disability used for this call? No    Completed refill call assessment today to schedule patient's medication shipment from the [facility] [phone]).  All relevant notes have been reviewed.     Specialty medication(s) and dose(s) confirmed: Regimen is correct and unchanged.   Changes to medications: Yoceline reports no changes at this time.  Changes to insurance: No  New side effects reported not previously addressed with a pharmacist or physician: None reported  Questions for the pharmacist: No    Confirmed patient received a Conservation officer, historic buildings and a Surveyor, mining with first shipment. The patient will receive a drug information handout for each medication shipped and additional FDA Medication Guides as required.       DISEASE/MEDICATION-SPECIFIC INFORMATION        N/A    SPECIALTY MEDICATION ADHERENCE     Medication Adherence    Patient reported X missed doses in the last month: 0  Specialty Medication: Cyclosporine 25  Patient is on additional specialty medications: No  Patient is on more than two specialty medications: No  Any gaps in refill history greater than 2 weeks in the last 3 months: no  Demonstrates understanding of importance of adherence: yes  Informant: patient              Were doses missed due to medication being on hold? No    Cyclosporine 25mg : Patient has 7 days of medication on hand    REFERRAL TO PHARMACIST     Referral to the pharmacist: Not needed      [facility]     Shipping address confirmed in Epic.     Delivery Scheduled: Yes, Expected medication delivery date: 8/11.     Medication will be delivered via UPS to the prescription address in Epic WAM.    Olga Millers   [facility] Specialty Technician

## 2021-06-03 DIAGNOSIS — Z01818 Encounter for other preprocedural examination: Principal | ICD-10-CM

## 2021-06-03 DIAGNOSIS — N186 End stage renal disease: Principal | ICD-10-CM

## 2021-06-03 DIAGNOSIS — R0609 Other forms of dyspnea: Principal | ICD-10-CM

## 2021-06-03 NOTE — Unmapped (Signed)
Edited results for 05/19/21 H&H.  Correct values are now entered.

## 2021-06-04 NOTE — Unmapped (Signed)
7/20 labs are non-txp labs - drawn @ dialysis center.

## 2021-06-16 ENCOUNTER — Encounter (HOSPITAL_BASED_OUTPATIENT_CLINIC_OR_DEPARTMENT_OTHER): Payer: Self-pay

## 2021-06-16 ENCOUNTER — Other Ambulatory Visit: Payer: Self-pay

## 2021-06-16 ENCOUNTER — Emergency Department (HOSPITAL_BASED_OUTPATIENT_CLINIC_OR_DEPARTMENT_OTHER)
Admission: EM | Admit: 2021-06-16 | Discharge: 2021-06-16 | Disposition: A | Discharge status: Home / Self Care | Payer: Medicare Other | Attending: Emergency Medicine | Admitting: Emergency Medicine

## 2021-06-16 ENCOUNTER — Encounter: Payer: Self-pay | Admitting: Hematology & Oncology

## 2021-06-16 ENCOUNTER — Other Ambulatory Visit (HOSPITAL_BASED_OUTPATIENT_CLINIC_OR_DEPARTMENT_OTHER): Payer: Self-pay

## 2021-06-16 DIAGNOSIS — J399 Disease of upper respiratory tract, unspecified: Secondary | ICD-10-CM

## 2021-06-16 DIAGNOSIS — Z79899 Other long term (current) drug therapy: Secondary | ICD-10-CM | POA: Diagnosis not present

## 2021-06-16 DIAGNOSIS — N186 End stage renal disease: Secondary | ICD-10-CM | POA: Insufficient documentation

## 2021-06-16 DIAGNOSIS — Z992 Dependence on renal dialysis: Secondary | ICD-10-CM | POA: Diagnosis not present

## 2021-06-16 DIAGNOSIS — H9203 Otalgia, bilateral: Secondary | ICD-10-CM | POA: Insufficient documentation

## 2021-06-16 DIAGNOSIS — Z20822 Contact with and (suspected) exposure to covid-19: Secondary | ICD-10-CM | POA: Insufficient documentation

## 2021-06-16 DIAGNOSIS — J069 Acute upper respiratory infection, unspecified: Secondary | ICD-10-CM | POA: Diagnosis not present

## 2021-06-16 DIAGNOSIS — E1122 Type 2 diabetes mellitus with diabetic chronic kidney disease: Secondary | ICD-10-CM | POA: Insufficient documentation

## 2021-06-16 DIAGNOSIS — Z91040 Latex allergy status: Secondary | ICD-10-CM | POA: Diagnosis not present

## 2021-06-16 DIAGNOSIS — I120 Hypertensive chronic kidney disease with stage 5 chronic kidney disease or end stage renal disease: Secondary | ICD-10-CM | POA: Insufficient documentation

## 2021-06-16 DIAGNOSIS — R519 Headache, unspecified: Secondary | ICD-10-CM | POA: Diagnosis present

## 2021-06-16 LAB — RESP PANEL BY RT-PCR (FLU A&B, COVID) ARPGX2
Influenza A by PCR: NEGATIVE
Influenza B by PCR: NEGATIVE
SARS Coronavirus 2 by RT PCR: NEGATIVE

## 2021-06-16 MED ORDER — ONDANSETRON 4 MG PO TBDP: 4.0000 mg | ORAL_TABLET | Freq: Three times a day (TID) | ORAL | 0 refills | Status: DC | PRN

## 2021-06-16 MED ORDER — ACETAMINOPHEN 500 MG PO TABS
500.0000 mg | ORAL_TABLET | Freq: Once | ORAL | Status: AC
  Administered 2021-06-16: 500 mg via ORAL
  Filled 2021-06-16: qty 1

## 2021-06-16 MED ORDER — ONDANSETRON 4 MG PO TBDP
4.0000 mg | ORAL_TABLET | Freq: Three times a day (TID) | ORAL | 0 refills | Status: AC | PRN
  Filled 2021-06-16: qty 20, 7d supply, fill #0

## 2021-06-16 NOTE — Discharge Instructions (Addendum)
Please contact your dialysis center to request getting dialysis today.  If you cannot get it today then request tomorrow.  Come back to ER if you develop worsening headache, vomiting, fever, chest pain, difficulty breathing or other new concerning symptom.

## 2021-06-16 NOTE — ED Provider Notes (Signed)
[facility] EMERGENCY DEPARTMENT Provider Note   CSN: [id number] Arrival date & time: 06/16/21  [id number]     History Chief Complaint  Patient presents with   Nasal Congestion    Tami [name] is a 65 y.o. female.  Presenting to the emergency room with concern for nasal congestion.  Patient reports symptoms ongoing for the past day or so.  Also has noted some aching in both of her ears.  Yesterday noticed a mild dull frontal headache.  Headache is ongoing today.  Not sudden onset, not worst headache of her life.  No associated neck stiffness, no fevers or chills.  Denies any significant cough.  No chest pain or difficulty in breathing.  No sick contacts.  Goes to dialysis Monday Wednesday Friday.  Had complete session on Monday. HPI     Past Medical History:  Diagnosis Date   Anemia    Arthritis    ESRD (end stage renal disease) on dialysis ([place])    "MWF; [place]" (10/04/2018)   Gout    Hepatitis, autoimmune ([place]) 12/08/2011   Hypertension    Seizures ([place]) 10/03/2018    Patient Active Problem List   Diagnosis Date Noted   Seizure ([place]) 10/03/2018   Seizures ([place]) 10/03/2018   Allergic rhinitis 08/20/2018   Chronic superficial gastritis without bleeding 08/11/2018   Dialysis patient ([place]) 08/11/2018   PEG (percutaneous endoscopic gastrostomy) status ([place]) 08/11/2018   Essential hypertension, benign 08/04/2018   Post-menopausal osteoporosis 08/04/2018   Chronic respiratory failure ([place]) 08/04/2018   End stage renal disease on dialysis due to type 2 diabetes mellitus ([place]) 08/04/2018   Immunosuppression ([place]) 08/04/2018   Chronic gout due to renal impairment 08/04/2018   Pancreatic insufficiency 08/04/2018   DDD (degenerative disc disease), lumbosacral 01/02/2014   Type 2 diabetes mellitus with end-stage renal disease ([place]) 01/02/2014   Thoracic and lumbosacral neuritis 01/02/2014   Osteoarthritis of right knee 09/13/2013   Chronic low back pain 09/13/2013    Arthritis of knee, degenerative 09/13/2013   Depression with anxiety 03/30/2013   HLD (hyperlipidemia) 03/30/2013   Angulation of spine 03/30/2013   H/O liver transplant ([place]) 03/30/2013   Autoimmune hepatitis ([place]) 12/08/2011   Anemia of renal disease 09/28/2011   Pancytopenia ([place]) 09/28/2011   Bone marrow failure ([place]) 09/28/2011    Past Surgical History:  Procedure Laterality Date   ABDOMINAL HYSTERECTOMY     AV FISTULA PLACEMENT Left 10/02/2018   Procedure: Creation of left arm Brachiocephalic Fistula;  Surgeon: Waynetta Sandy, MD;  Location: [place];  Service: Vascular;  Laterality: Left;   FISTULA SUPERFICIALIZATION Left 11/29/2018   Procedure: TRANSLOCATION/FISTULA SUPERFICIALIZATION OF LEFT ARM FISTULA;  Surgeon: Marty Heck, MD;  Location: [facility];  Service: Vascular;  Laterality: Left;   LIVER TRANSPLANT     1996   TONSILLECTOMY       OB History   No obstetric history on file.     Family History  Problem Relation Age of Onset   Stroke Mother    Cancer Father    Lupus Sister        1 sister   Hypertension Sister    Cancer Brother        prostate- 1 brother   Lupus Brother     Social History   Tobacco Use   Smoking status: Never   Smokeless tobacco: Never   Tobacco comments:    never used tobacco  Vaping Use   Vaping Use: Never used  Substance  Use Topics   Alcohol use: Not Currently    Alcohol/week: 0.0 standard drinks   Drug use: No    Home Medications Prior to Admission medications   Medication Sig Start Date End Date Taking? Authorizing Provider  albuterol (PROVENTIL HFA;VENTOLIN HFA) 108 (90 Base) MCG/ACT inhaler Inhale 2 puffs into the lungs every 6 (six) hours as needed.  07/20/18   [provider]  allopurinol (ZYLOPRIM) 100 MG tablet Take 100 mg by mouth daily. 03/18/15   [provider]  amLODipine (NORVASC) 5 MG tablet Take 5 mg by mouth daily. 12/07/18   [provider]  AURYXIA 1 GM 210 MG(Fe)  tablet TAKE 2 TABLETS BY MOUTH THREE TIMES A DAY WITH MEALS.   SWALLOW WHOLE, DO NOT CHEW OR CRUSH MEDICATION   AND 1 TABLET WITH A SNACK 04/01/19   [provider]  B Complex-C-Zn-Folic Acid (DIALYVITE AB-123456789 15) 0.8 MG TABS Take by mouth. 05/29/19   [provider]  cycloSPORINE modified (NEORAL) 25 MG capsule Take 50 mg by mouth 2 (two) times daily.     [provider]  ENSURE (ENSURE) Take 1 Can by mouth daily.  07/30/18   [provider]  fluticasone Asencion Islam ALLERGY RELIEF) 50 MCG/ACT nasal spray Place into the nose. 02/13/19   [provider]  folic acid (FOLVITE) 1 MG tablet Take 1 mg by mouth daily. 11/26/18   [provider]  gabapentin (NEURONTIN) 100 MG capsule TAKE 2 CAPSULE BY MOUTH TWICE A DAY AS NEEDED TAKE FOR PAIN 01/20/19   [provider]  labetalol (NORMODYNE) 100 MG tablet Take 100 mg by mouth 2 (two) times daily. 07/20/18   [provider]  levETIRAcetam (KEPPRA) 500 MG tablet Take 1 tablet (500 mg total) by mouth daily AND 1 tablet (500 mg total) every Monday, Wednesday, and Friday at 6 PM. 09/22/20   Lomax, Amy, NP  lidocaine-prilocaine (EMLA) cream APPLY SMALL AMOUNT TO ACCESS SITE (AVF) 30 - 60 MINUTES BEFORE DIALYSIS. COVER WITH OCCLUSIVE DRESSING ( SARAN WRAP) 01/16/19   [provider]  loratadine (CLARITIN) 10 MG tablet Take 10 mg by mouth daily. X 10 days 08/11/18 08/21/18  [provider]  metoprolol succinate (TOPROL-XL) 50 MG 24 hr tablet 1 (ONE) TABLET TABLET ER 24HR DAILY 01/18/19   [provider]  Oxycodone HCl 10 MG TABS Take 1 tablet (10 mg total) by mouth every 6 (six) hours as needed for up to 10 doses (severe pain). - hold for sedation Patient taking differently: Take 10 mg by mouth every 4 (four) hours as needed (severe pain). - hold for sedation 10/02/18   Dagoberto Ligas, PA-C  Pancrelipase, Lip-Prot-Amyl, 24000-76000 units CPEP Pancrelipase 24000-76000 10/10/18    [provider]  pantoprazole (PROTONIX) 40 MG tablet Take by mouth. 10/10/18   [provider]  PARoxetine (PAXIL) 10 MG tablet Take by mouth. 06/17/19   [provider]  promethazine (PHENERGAN) 12.5 MG tablet Take 12.5 mg by mouth daily. 12/29/18   [provider]  RENAGEL 800 MG tablet TAKE 2 TABLETS BY MOUTH THREE TIMES DAILY WITH MEALS 02/01/19   [provider]  Vitamin D, Ergocalciferol, (DRISDOL) 1.25 MG (50000 UT) CAPS capsule Take 50,000 Units by mouth once a week. Sunday 11/06/18   [provider]    Allergies    Iodinated diagnostic agents, Ioxaglate, Latex, and Quelicin  [succinylcholine chloride]  Review of Systems   Review of Systems  Constitutional:  Positive for fatigue. Negative for chills  and fever.  HENT:  Positive for ear pain, rhinorrhea and sinus pain. Negative for sore throat.   Eyes:  Negative for pain and visual disturbance.  Respiratory:  Negative for cough and shortness of breath.   Cardiovascular:  Negative for chest pain and palpitations.  Gastrointestinal:  Negative for abdominal pain and vomiting.  Genitourinary:  Negative for dysuria and hematuria.  Musculoskeletal:  Negative for arthralgias and back pain.  Skin:  Negative for color change and rash.  Neurological:  Negative for seizures and syncope.  All other systems reviewed and are negative.  Physical Exam Updated Vital Signs BP 121/72 (BP Location: Right Arm)   Pulse (!) 48   Temp 98.6 F (37 C) (Oral)   Resp 16   Ht 5' (1.524 m)   Wt 56.2 kg   SpO2 96%   BMI 24.22 kg/m   Physical Exam Vitals and nursing note reviewed.  Constitutional:      General: She is not in acute distress.    Appearance: She is well-developed.  HENT:     Head: Normocephalic and atraumatic.     Right Ear: Tympanic membrane, ear canal and external ear normal.     Left Ear: Tympanic membrane, ear canal and external ear normal.     Mouth/Throat:     Mouth: Mucous  membranes are moist.     Pharynx: Oropharynx is clear. No oropharyngeal exudate or posterior oropharyngeal erythema.  Eyes:     Conjunctiva/sclera: Conjunctivae normal.  Cardiovascular:     Rate and Rhythm: Normal rate and regular rhythm.     Heart sounds: No murmur heard. Pulmonary:     Effort: Pulmonary effort is normal. No respiratory distress.     Breath sounds: Normal breath sounds.  Abdominal:     Palpations: Abdomen is soft.     Tenderness: There is no abdominal tenderness.  Musculoskeletal:     Cervical back: Neck supple.  Skin:    General: Skin is warm and dry.     Capillary Refill: Capillary refill takes less than 2 seconds.  Neurological:     General: No focal deficit present.     Mental Status: She is alert and oriented to person, place, and time.  Psychiatric:        Mood and Affect: Mood normal.        Behavior: Behavior normal.    ED Results / Procedures / Treatments   Labs (all labs ordered are listed, but only abnormal results are displayed) Labs Reviewed  RESP PANEL BY RT-PCR (FLU A&B, COVID) [id number]    EKG None  Radiology No results found.  Procedures Procedures   Medications Ordered in ED Medications  acetaminophen (TYLENOL) tablet 500 mg (has no administration in time range)    ED Course  I have reviewed the triage vital signs and the nursing notes.  Pertinent labs & imaging results that were available during my care of the patient were reviewed by me and considered in my medical decision making (see chart for details).    MDM Rules/Calculators/A&P                           65 year old ESRD on dialysis presents to ER with concern for nasal congestion.  On exam she appears well in no apparent distress.  Her lungs are clear.  Ears clear, posterior oropharynx clear.  Given constellation of symptoms, suspect viral upper respiratory illness.  We will send COVID/flu testing.  Given her  well appearance, believe she can be discharged and managed in  the outpatient setting.  Recommend she follow-up with her primary care doctor, contact her dialysis center to request dialysis today or reschedule for tomorrow if she misses today.  After the discussed management above, the patient was determined to be safe for discharge.  The patient was in agreement with this plan and all questions regarding their care were answered.  ED return precautions were discussed and the patient will return to the ED with any significant worsening of condition.  Final Clinical Impression(s) / ED Diagnoses Final diagnoses:  Upper respiratory disease    Rx / DC Orders ED Discharge Orders     None        Lucrezia Starch, MD 06/16/21 [phone]

## 2021-06-16 NOTE — ED Triage Notes (Addendum)
Pt reports nasal congestion, sore throat, frontal headache and bilateral ear aches onset yesterday.

## 2021-06-16 NOTE — ED Notes (Signed)
Covid Swab obtained and to the lab 

## 2021-06-16 NOTE — ED Notes (Signed)
ED Provider at bedside. 

## 2021-06-16 NOTE — ED Notes (Signed)
Patient Alert and oriented to baseline. Stable and ambulatory to baseline. Patient verbalized understanding of the discharge instructions.  Patient belongings were taken by the patient.   

## 2021-06-21 DIAGNOSIS — Z944 Liver transplant status: Principal | ICD-10-CM

## 2021-06-21 DIAGNOSIS — Z79899 Other long term (current) drug therapy: Principal | ICD-10-CM

## 2021-07-01 NOTE — Unmapped (Signed)
Clinical call set up cyclosporine this week, however patient got a 90ds  Back in early August.  Adjusting call date to appropriate time- ie late October/early Nov.

## 2021-07-08 ENCOUNTER — Ambulatory Visit: Admit: 2021-07-08 | Payer: MEDICARE

## 2021-07-15 ENCOUNTER — Other Ambulatory Visit: Payer: Self-pay | Admitting: Neurology

## 2021-07-15 ENCOUNTER — Telehealth: Payer: Self-pay | Admitting: Family Medicine

## 2021-07-15 MED ORDER — LEVETIRACETAM 500 MG PO TABS: ORAL_TABLET | ORAL | 0 refills | Status: DC

## 2021-07-15 NOTE — Telephone Encounter (Signed)
Refill has been sent to the pharmacy requested by the patient. The pt must keep upcoming dec appt.

## 2021-07-15 NOTE — Telephone Encounter (Signed)
Pt is requesting a refill for levETIRAcetam (KEPPRA) 500 MG tablet .  Pharmacy: [facility] #[id number] [address] JPhilo Crawford [id number]

## 2021-07-19 DIAGNOSIS — Z79899 Other long term (current) drug therapy: Principal | ICD-10-CM

## 2021-07-19 DIAGNOSIS — Z944 Liver transplant status: Principal | ICD-10-CM

## 2021-08-03 ENCOUNTER — Other Ambulatory Visit: Payer: Self-pay

## 2021-08-03 MED ORDER — LEVETIRACETAM 500 MG PO TABS: ORAL_TABLET | ORAL | 0 refills | Status: DC

## 2021-08-05 ENCOUNTER — Ambulatory Visit: Admit: 2021-08-05 | Discharge: 2021-08-06 | Payer: MEDICARE

## 2021-08-16 DIAGNOSIS — Z796 Long-term use of immunosuppressant medication: Principal | ICD-10-CM

## 2021-08-16 DIAGNOSIS — Z944 Liver transplant status: Principal | ICD-10-CM

## 2021-08-31 NOTE — Unmapped (Signed)
[facility] Clinical Assessment & Refill Coordination Note    Katherine Norton, [place]: 1956-06-07  Phone: [phone] (home)     All above HIPAA information was verified with patient.     Was a Nurse, learning disability used for this call? No    Specialty Medication(s):   Transplant: cyclosporine 25mg      Current Outpatient Medications   Medication Sig Dispense Refill   ??? acetaminophen (TYLENOL) 325 MG tablet 2 tablets (650 mg total) by G-tube route every six (6) hours as needed. (Patient taking differently: Take 650 mg by mouth every six (6) hours as needed (headaches).)  0   ??? albuterol 2.5 mg/0.5 mL nebulizer solution Inhale 0.5 mL (2.5 mg total) by nebulization every six (6) hours as needed for wheezing or shortness of breath. 30 each 12   ??? allopurinoL (ZYLOPRIM) 100 MG tablet Take 100 mg by mouth daily as needed.     ??? amLODIPine (NORVASC) 5 MG tablet Take 5 mg by mouth daily.     ??? calcitonin, salmon, (MIACALCIN) 200 unit/actuation nasal spray 1 spray by Alternating Nares route once daily.      ??? celecoxib (CELEBREX) 100 MG capsule TAKE 1 (ONE) CAPSULE BY MOUTH TWICE A DAY, MAX DAILY DOSE  2 CAPSULE     ??? cetirizine (ZYRTEC) 5 MG tablet TAKE 1 (ONE) TABLET BY MOUTH DAILY, MAX DAILY DOSE: 1 TABLET     ??? cycloSPORINE modified (NEORAL) 25 MG capsule Take 2 capsules (50 mg total) by mouth Two (2) times a day. 360 capsule 3   ??? dextrose (D10W) 10% Soln bolus Infuse 125 mL into a venous catheter every thirty (30) minutes as needed.  0   ??? dextrose 5 % and sodium chloride 0.9 % (D5-NS) infu Infuse 25 mL/hr into a venous catheter continuous as needed (if tube feed held or NPO).  0   ??? doxercalciferol (HECTOROL IV) 8 mcg.     ??? famotidine (PEPCID) 20 MG tablet 1 tablet (20 mg total) by G-tube route daily. (Patient taking differently: 20 mg  in the morning.) 30 tablet 0   ??? fluticasone propionate (FLONASE) 50 mcg/actuation nasal spray 1 spray into each nostril.     ??? food supplemt, lactose-reduced (ENSURE) Liqd Take 120 mL by mouth.     ??? gabapentin (NEURONTIN) 100 MG capsule Take 100 mg by mouth as needed in the morning.     ??? gentamicin 1 mg/mL, sodium citrate 4% Gent locks with dialysis 2.4 mL 4   ??? heparin sodium,porcine (HEPARIN, PORCINE,) 1,000 unit/mL 1000 unit/mL injection This heparin is for dialysis use ONLY. RN if you are unable to aspirate from both lumes, call provider piror to using the catheter. 1 mL 0   ??? heparin sodium,porcine (HEPARIN, PORCINE,) 5,000 unit/mL injection Inject 1 mL (5,000 Units total) under the skin every eight (8) hours. 1 mL 0   ??? iron sucrose 100 mg, OVERFILL 10 mL in sodium chloride 0.9 % 100 mL IVPB 50 mg.     ??? levETIRAcetam (KEPPRA) 500 mg/100 mL PgBk 1 tablet.     ??? lipase/protease/amylase (PANCRELIPASE 16,000 ORAL) Pancrelipase 24000-76000     ??? loratadine (CLARITIN) 10 mg tablet Take 10 mg by mouth daily as needed.     ??? melatonin 3 mg Tab Take 1 tablet (3 mg total) by mouth nightly.  0   ??? methoxy peg-epoetin beta (MIRCERA INJ) 60 mcg.     ??? metoprolol succinate (TOPROL-XL) 50 MG 24 hr tablet  50 mg  in the morning.     ??? ondansetron (ZOFRAN) 4 MG tablet TAKE 1 TABLET BY MOUTH EVERY EIGHT HOURS AS NEEDED NAUSEA/VOMITING     ??? oxyCODONE (ROXICODONE) 5 MG immediate release tablet 1 tablet (5 mg total) by G-tube route every six (6) hours as needed. for up to 5 days (Patient taking differently: Take 5 mg by mouth every six (6) hours as needed.) 10 tablet 0   ??? vitamin B complx-C-FA-zinc cit (DIALYVITE 800 WITH ZINC 15) 0.8-15 mg Tab Take by mouth.     ??? vitamin E, dl, acetate, 16.1 mg (50 unit)/mL Drop Take 1 capsule by mouth.       No current facility-administered medications for this visit.        Changes to medications: [name] reports no changes at this time.    Allergies   Allergen Reactions   ??? Iodinated Contrast Media    ??? Ioxaglate Sodium    ??? Latex    ??? Succinylcholine Chloride      Other reaction(s): Other (See Comments)       Changes to allergies: No    SPECIALTY MEDICATION ADHERENCE     Cyclosporine 25 mg: 5 days of medicine on hand       Medication Adherence    Patient reported X missed doses in the last month: 0  Specialty Medication: Cyclosporine 25mg   Patient is on additional specialty medications: No          Specialty medication(s) dose(s) confirmed: Regimen is correct and unchanged.     Are there any concerns with adherence? No    Adherence counseling provided? Not needed    CLINICAL MANAGEMENT AND INTERVENTION      Clinical Benefit Assessment:    Do you feel the medicine is effective or helping your condition? Yes    Clinical Benefit counseling provided? Not needed    Adverse Effects Assessment:    Are you experiencing any side effects? No    Are you experiencing difficulty administering your medicine? No    Quality of Life Assessment:         How many days over the past month did your liver transplant  keep you from your normal activities? For example, brushing your teeth or getting up in the morning. 0    Have you discussed this with your provider? Not needed    Acute Infection Status:    Acute infections noted within Epic:  No active infections  Patient reported infection: None    Therapy Appropriateness:    Is therapy appropriate and patient progressing towards therapeutic goals? Yes, therapy is appropriate and should be continued    DISEASE/MEDICATION-SPECIFIC INFORMATION      N/A    PATIENT SPECIFIC NEEDS     - Does the patient have any physical, cognitive, or cultural barriers? No    - Is the patient high risk? No    - Does the patient require a Care Management Plan? No     - Does the patient require physician intervention or other additional services (i.e. nutrition, smoking cessation, social work)? No      SHIPPING     Specialty Medication(s) to be Shipped:   Transplant: cyclosporine 25mg     Other medication(s) to be shipped: No additional medications requested for fill at this time     Changes to insurance: No    Delivery Scheduled: Yes, Expected medication delivery date: 09/02/21.     Medication will be delivered via UPS to the  confirmed prescription address in [facility].    The patient will receive a drug information handout for each medication shipped and additional FDA Medication Guides as required.  Verified that patient has previously received a Conservation officer, historic buildings and a Surveyor, mining.    The patient or caregiver noted above participated in the development of this care plan and knows that they can request review of or adjustments to the care plan at any time.      All of the patient's questions and concerns have been addressed.    Tera Helper   [facility] Specialty Pharmacist

## 2021-09-01 MED FILL — CYCLOSPORINE MODIFIED 25 MG CAPSULE: ORAL | 90 days supply | Qty: 360 | Fill #1

## 2021-09-13 DIAGNOSIS — Z944 Liver transplant status: Principal | ICD-10-CM

## 2021-09-13 DIAGNOSIS — Z796 Long-term use of immunosuppressant medication: Principal | ICD-10-CM

## 2021-09-16 ENCOUNTER — Encounter: Payer: Self-pay | Admitting: Hematology & Oncology

## 2021-09-22 NOTE — Progress Notes (Signed)
Chief Complaint  Patient presents with   Follow-up    Rm 11, alone. Here for yearly SZ f/u. No sz like activity since last OV. Pt reports her body "jumps", around once a month. Unsure why.      HISTORY OF PRESENT ILLNESS: 09/23/21 ALL: Tami [name] returns for follow up for seizures. She continues levetiracetam 500mg  daily with extra 500mg  dose M,W,F. Previous EEG and MRI were unremarkable with last event 09/2018. She denies seizure activity since. She feels that she is doing fairly well. She does continue to note intermittent muscle jerks at night. May happen 1-2 times a month or may not occur for several months. She is alert and aware of symptoms. Always her upper extremities and usually both arms jump. Last a couple of seconds. No post ictal symptoms. She is tolerating meds. She continues dialysis. She is followed closely by PCP and nephrology. She lives alone. She drives without difficulty. She uses cane for stability and denies falls.   09/22/2020 ALL:  Tami [name] is a 65 y.o. female here today for follow up for seizures. She continues levetiracetam 500mg  daily with extra dose of 500mg  on dialysis days (M,W,F). She is tolerating medication well. No seizure activity. She does have some shaking of her whole body at night when in bed. She is aware of shaking and does not lose consciousness. She is able to get up and move around and shaking improves. She lost her son in a car accident last year and now lives alone. She has family locally that helps take care of her and her daughter visits often from [place]. She drives without difficulty.   HISTORY (copied from previous note)  UPDATE (06/18/19, VRP): Since last visit, doing well. Symptoms are stable. No seizures. No alleviating or aggravating factors. Tolerating levetiracetam.     PRIOR HPI (12/17/18): 65 year old female here for evaluation of seizures.  History of liver transplant, hypertension, end-stage renal disease on hemodialysis.    10/03/2018 patient was at office visit, about to give blood when all of a sudden she became unresponsive and had some jerking movements.  Staff could not feel a pulse and started CPR.  Patient was immediately attended to by the rapid response team.  She was taken downstairs to the emergency room.  She may have had some type of gaze deviation initially resolved spontaneously.  While in the emergency room she had a second event with tonic-clonic seizures.  She was given IV Ativan and treated with Keppra.  She was admitted for further evaluation.   MRI, MRA of the head were obtained which showed no acute findings.  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  EEG was unremarkable.  Patient was discharged on no antiseizure medication.   Since that time patient is doing well.  No further events.   REVIEW OF SYSTEMS: Out of a complete 14 system review of symptoms, the patient complains only of the following symptoms, shaking at night and all other reviewed systems are negative.   ALLERGIES: Allergies  Allergen Reactions   Iodinated Diagnostic Agents Other (See Comments)    UNSPECIFIED REACTION    Ioxaglate     UNSPECIFIED REACTION    Latex     UNSPECIFIED REACTION    Quelicin  [Succinylcholine Chloride] Other (See Comments)     HOME MEDICATIONS: Outpatient Medications Prior to Visit  Medication Sig Dispense Refill   allopurinol (ZYLOPRIM) 100 MG tablet Take 100 mg by mouth daily.  5   amLODipine (NORVASC) 5 MG tablet Take 5 mg by mouth daily.     B Complex-C-Zn-Folic Acid (DIALYVITE 163-AGTX 15) 0.8 MG TABS Take by mouth.     cycloSPORINE modified (NEORAL) 25 MG capsule Take 50 mg by mouth 2 (two) times daily.      ENSURE (ENSURE) Take 1 Can by mouth daily.      fluticasone (FLONASE) 50 MCG/ACT nasal spray Place into the nose.     folic acid (FOLVITE) 1 MG tablet Take 1 mg by mouth daily.     gabapentin (NEURONTIN) 100 MG capsule TAKE 2 CAPSULE BY  MOUTH TWICE A DAY AS NEEDED TAKE FOR PAIN     labetalol (NORMODYNE) 100 MG tablet Take 100 mg by mouth 2 (two) times daily.     lidocaine-prilocaine (EMLA) cream APPLY SMALL AMOUNT TO ACCESS SITE (AVF) 30 - 60 MINUTES BEFORE DIALYSIS. COVER WITH OCCLUSIVE DRESSING ( SARAN WRAP)     metoprolol succinate (TOPROL-XL) 50 MG 24 hr tablet 1 (ONE) TABLET TABLET ER 24HR DAILY     ondansetron (ZOFRAN ODT) 4 MG disintegrating tablet Dissolve 1 tablet (4 mg total) by mouth every 8 (eight) hours as needed for nausea or vomiting. 20 tablet 0   Oxycodone HCl 10 MG TABS Take 1 tablet (10 mg total) by mouth every 6 (six) hours as needed for up to 10 doses (severe pain). - hold for sedation (Patient taking differently: Take 10 mg by mouth every 4 (four) hours as needed (severe pain). - hold for sedation) 10 tablet 0   Pancrelipase, Lip-Prot-Amyl, 24000-76000 units CPEP Pancrelipase 24000-76000     pantoprazole (PROTONIX) 40 MG tablet Take by mouth.     PARoxetine (PAXIL) 10 MG tablet Take by mouth.     promethazine (PHENERGAN) 12.5 MG tablet Take 12.5 mg by mouth daily.     RENAGEL 800 MG tablet TAKE 2 TABLETS BY MOUTH THREE TIMES DAILY WITH MEALS     Vitamin D, Ergocalciferol, (DRISDOL) 1.25 MG (50000 UT) CAPS capsule Take 50,000 Units by mouth once a week. Sunday     albuterol (PROVENTIL HFA;VENTOLIN HFA) 108 (90 Base) MCG/ACT inhaler Inhale 2 puffs into the lungs every 6 (six) hours as needed.      AURYXIA 1 GM 210 MG(Fe) tablet TAKE 2 TABLETS BY MOUTH THREE TIMES A DAY WITH MEALS.   SWALLOW WHOLE, DO NOT CHEW OR CRUSH MEDICATION   AND 1 TABLET WITH A SNACK     loratadine (CLARITIN) 10 MG tablet Take 10 mg by mouth daily. X 10 days     levETIRAcetam (KEPPRA) 500 MG tablet Take 1 tablet Daily (Take 1 Tablet at 6 pm on Mon,Wed and Friday) 120 tablet 0   No facility-administered medications prior to visit.     PAST MEDICAL HISTORY: Past Medical History:  Diagnosis Date   Anemia    Arthritis    ESRD (end  stage renal disease) on dialysis ([place])    "MWF; [place]" (10/04/2018)   Gout    Hepatitis, autoimmune ([place]) 12/08/2011   Hypertension    Seizures ([place]) 10/03/2018     PAST SURGICAL HISTORY: Past Surgical History:  Procedure Laterality Date   ABDOMINAL HYSTERECTOMY     AV FISTULA PLACEMENT Left 10/02/2018   Procedure: Creation of left arm Brachiocephalic Fistula;  Surgeon: Waynetta Sandy, MD;  Location: [place];  Service: Vascular;  Laterality: Left;   FISTULA SUPERFICIALIZATION Left 11/29/2018   Procedure: TRANSLOCATION/FISTULA SUPERFICIALIZATION OF LEFT ARM FISTULA;  Surgeon:  Marty Heck, MD;  Location: [facility] OR;  Service: Vascular;  Laterality: Left;   LIVER TRANSPLANT     1996   TONSILLECTOMY       FAMILY HISTORY: Family History  Problem Relation Age of Onset   Stroke Mother    Cancer Father    Lupus Sister        1 sister   Hypertension Sister    Cancer Brother        prostate- 1 brother   Lupus Brother      SOCIAL HISTORY: Social History   Socioeconomic History   Marital status: Divorced    Spouse name: Not on file   Number of children: 2   Years of education: some college   Highest education level: Not on file  Occupational History    Comment: na  Tobacco Use   Smoking status: Never   Smokeless tobacco: Never   Tobacco comments:    never used tobacco  Vaping Use   Vaping Use: Never used  Substance and Sexual Activity   Alcohol use: Not Currently    Alcohol/week: 0.0 standard drinks   Drug use: No   Sexual activity: Not on file  Other Topics Concern   Not on file  Social History Narrative   Lives with son   Caffeine - tea , little    Social Determinants of Health   Financial Resource Strain: Not on file  Food Insecurity: Not on file  Transportation Needs: Not on file  Physical Activity: Not on file  Stress: Not on file  Social Connections: Not on file  Intimate Partner Violence: Not on file      PHYSICAL EXAM  Vitals:    09/23/21 0812  BP: (!) 175/93  Pulse: 80  Weight: 117 lb (53.1 kg)  Height: 5' (1.524 m)    Body mass index is 22.85 kg/m.   Generalized: Well developed, in no acute distress  Cardiology: normal rate and rhythm, no murmur auscultated  Respiratory: clear to auscultation bilaterally    Neurological examination  Mentation: Alert oriented to time, place, history taking. Follows all commands speech and language fluent Cranial nerve II-XII: Pupils were equal round reactive to light. Extraocular movements were full, visual field were full on confrontational test. Facial sensation and strength were normal. Head turning and shoulder shrug  were normal and symmetric. Motor: The motor testing reveals 5 over 5 strength of bilateral upper extremities, 4/5 left hip flexion, 3+/4 right hip flexion.  Sensory: Sensory testing is intact to soft touch on all 4 extremities. No evidence of extinction is noted.  Coordination: Cerebellar testing reveals good finger-nose-finger and heel-to-shin bilaterally.  Gait and station: Gait is stable with cane. Tandem not attempted  Reflexes: Deep tendon reflexes are reduced but symmetric bilaterally.     DIAGNOSTIC DATA (LABS, IMAGING, TESTING) - I reviewed patient records, labs, notes, testing and imaging myself where available.  Lab Results  Component Value Date   WBC 4.5 02/20/2020   HGB 10.5 (L) 02/20/2020   HCT 32.8 (L) 02/20/2020   MCV 101.2 (H) 02/20/2020   PLT 106 (L) 02/20/2020      Component Value Date/Time   NA 137 02/20/2020 1411   NA 137 08/10/2018 0000   NA 140 10/13/2017 1106   NA 138 04/11/2017 1023   K 2.8 (LL) 02/20/2020 1411   K 5.3 no visable hemolysis (H) 10/13/2017 1106   K 5.2 No visable hemolysis (H) 04/11/2017 1023   CL 92 (L) 02/20/2020 1411  CL 104 10/13/2017 1106   CO2 32 02/20/2020 1411   CO2 17 (L) 10/13/2017 1106   CO2 19 (L) 04/11/2017 1023   GLUCOSE 116 (H) 02/20/2020 1411   GLUCOSE 116 10/13/2017 1106   BUN  13 02/20/2020 1411   BUN 1 (A) 08/10/2018 0000   BUN 58 (H) 10/13/2017 1106   BUN 33.4 (H) 04/11/2017 1023   CREATININE 4.00 (HH) 02/20/2020 1411   CREATININE 5.9 (HH) 10/13/2017 1106   CREATININE 1.8 (H) 04/11/2017 1023   CALCIUM 9.3 02/20/2020 1411   CALCIUM 8.4 10/13/2017 1106   CALCIUM 9.0 04/11/2017 1023   PROT 7.9 02/20/2020 1411   PROT 7.7 10/13/2017 1106   PROT 8.8 (H) 04/11/2017 1023   ALBUMIN 3.6 02/20/2020 1411   ALBUMIN 3.3 10/13/2017 1106   ALBUMIN 3.7 04/11/2017 1023   AST 15 02/20/2020 1411   AST 16 04/11/2017 1023   ALT 5 02/20/2020 1411   ALT 15 10/13/2017 1106   ALT 8 04/11/2017 1023   ALKPHOS 71 02/20/2020 1411   ALKPHOS 68 10/13/2017 1106   ALKPHOS 80 04/11/2017 1023   BILITOT 0.5 02/20/2020 1411   BILITOT 0.54 04/11/2017 1023   GFRNONAA 11 (L) 02/20/2020 1411   GFRAA 13 (L) 02/20/2020 1411   Lab Results  Component Value Date   CHOL 124 10/03/2018   HDL 23 (L) 10/03/2018   LDLCALC 73 10/03/2018   TRIG 138 10/03/2018   CHOLHDL 5.4 10/03/2018   No results found for: HGBA1C Lab Results  Component Value Date   VITAMINB12 301 10/03/2018   Lab Results  Component Value Date   TSH 0.831 10/03/2018    ASSESSMENT AND PLAN  65 y.o. year old female  has a past medical history of Anemia, Arthritis, ESRD (end stage renal disease) on dialysis ([place]), Gout, Hepatitis, autoimmune ([place]) (12/08/2011), Hypertension, and Seizures ([place]) (10/03/2018). 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  She continues levetiracetam is tolerating well.  We will continue 500 mg daily with an extra dose of 500 mg on Monday Wednesday Friday.  Refills have been sent to the pharmacy. She is concerned about continued muscle jerks. Symptoms occur randomly. No clear concerns of seizure activity. I will repeat EEG to evaluate for any  epileptic concerns. She will continue close follow-up with care team.  Healthy lifestyle habits encouraged.  She will follow-up with me in 1 year, sooner if needed.  She verbalizes understanding and agreement with this plan.   Tami Presto, MSN, FNP-C 09/23/2021, 8:51 AM  [facility] [address] [place], [place] [address] [phone]

## 2021-09-22 NOTE — Patient Instructions (Signed)
Below is our plan:  We will continue levetiracetam 500mg  every day with an extra 500mg  tablet at 6pm following dialysis on Monday, Wednesday and Fridays. I will repeat EEG to evaluate for any continued seizure activity.   Please make sure you are staying well hydrated. I recommend 50-60 ounces daily. Well balanced diet and regular exercise encouraged. Consistent sleep schedule with 6-8 hours recommended.   Please continue follow up with care team as directed.   Follow up with me in 1 year, sooner if needed pending EEG results  You may receive a survey regarding today's visit. I encourage you to leave honest feed back as I do use this information to improve patient care. Thank you for seeing me today!

## 2021-09-23 ENCOUNTER — Ambulatory Visit (INDEPENDENT_AMBULATORY_CARE_PROVIDER_SITE_OTHER): Payer: Medicare Other | Admitting: Family Medicine

## 2021-09-23 ENCOUNTER — Encounter: Payer: Self-pay | Admitting: Family Medicine

## 2021-09-23 VITALS — BP 175/93 | HR 80 | Ht 60.0 in | Wt 117.0 lb

## 2021-09-23 DIAGNOSIS — N186 End stage renal disease: Secondary | ICD-10-CM | POA: Diagnosis not present

## 2021-09-23 DIAGNOSIS — Z992 Dependence on renal dialysis: Secondary | ICD-10-CM

## 2021-09-23 DIAGNOSIS — R569 Unspecified convulsions: Secondary | ICD-10-CM

## 2021-09-23 DIAGNOSIS — E1122 Type 2 diabetes mellitus with diabetic chronic kidney disease: Secondary | ICD-10-CM

## 2021-09-23 DIAGNOSIS — G255 Other chorea: Secondary | ICD-10-CM

## 2021-09-23 MED ORDER — LEVETIRACETAM 500 MG PO TABS: ORAL_TABLET | ORAL | 3 refills | Status: DC

## 2021-09-30 ENCOUNTER — Other Ambulatory Visit: Payer: Medicare Other | Admitting: *Deleted

## 2021-10-11 DIAGNOSIS — Z944 Liver transplant status: Principal | ICD-10-CM

## 2021-10-11 DIAGNOSIS — Z796 Long-term use of immunosuppressant medication: Principal | ICD-10-CM

## 2021-10-28 NOTE — Unmapped (Signed)
Called pt to check in, left vm requesting a return call. New standing lab orders sent to [facility].

## 2021-11-01 NOTE — Unmapped (Signed)
Notified by pt that she has transferred to a different dialysis center:  [facility] [address]  [place], [place] [address]  Ph: [phone] F: [phone]    Standing lab orders sent.

## 2021-11-02 NOTE — Unmapped (Signed)
Called patient to discuss scheduling Kidney Txp Eval appointments.   Scheduled while on phone. Patient good with time and date. Confirmed address. Letter sent.

## 2021-11-17 DIAGNOSIS — R0600 Dyspnea, unspecified: Principal | ICD-10-CM

## 2021-11-17 DIAGNOSIS — Z01810 Encounter for preprocedural cardiovascular examination: Principal | ICD-10-CM

## 2021-11-17 DIAGNOSIS — N184 Chronic kidney disease, stage 4 (severe): Principal | ICD-10-CM

## 2021-11-17 DIAGNOSIS — Z01818 Encounter for other preprocedural examination: Principal | ICD-10-CM

## 2021-11-18 ENCOUNTER — Ambulatory Visit: Admit: 2021-11-18 | Payer: MEDICARE

## 2021-11-18 ENCOUNTER — Ambulatory Visit: Admit: 2021-11-18 | Payer: MEDICARE | Attending: Surgery | Primary: Surgery

## 2021-11-30 NOTE — Unmapped (Signed)
[facility] Refill Coordination Note    Specialty Medication(s) to be Shipped:   Transplant: cyclosporine 25mg     Other medication(s) to be shipped: No additional medications requested for fill at this time     Katherine Norton, DOB: 1956-05-04  Phone: [phone] (home)       All above HIPAA information was verified with patient.     Was a Nurse, learning disability used for this call? No    Completed refill call assessment today to schedule patient's medication shipment from the [facility] [phone]).  All relevant notes have been reviewed.     Specialty medication(s) and dose(s) confirmed: Regimen is correct and unchanged.   Changes to medications: Katherine Norton reports no changes at this time.  Changes to insurance: No  New side effects reported not previously addressed with a pharmacist or physician: None reported  Questions for the pharmacist: No    Confirmed patient received a Conservation officer, historic buildings and a Surveyor, mining with first shipment. The patient will receive a drug information handout for each medication shipped and additional FDA Medication Guides as required.       DISEASE/MEDICATION-SPECIFIC INFORMATION        N/A    SPECIALTY MEDICATION ADHERENCE     Medication Adherence    Patient reported X missed doses in the last month: 0  Specialty Medication: Cyclosporine  Patient is on additional specialty medications: No  Patient is on more than two specialty medications: No  Any gaps in refill history greater than 2 weeks in the last 3 months: no  Demonstrates understanding of importance of adherence: yes  Informant: patient              Were doses missed due to medication being on hold? No    Cyclosporine 25mg : Patient has 14 days of medication on hand    REFERRAL TO PHARMACIST     Referral to the pharmacist: Not needed      [facility]     Shipping address confirmed in Epic.     Delivery Scheduled: Yes, Expected medication delivery date: 2/14.     Medication will be delivered via UPS to the prescription address in Epic WAM.    Katherine Norton   [facility] Specialty Technician

## 2021-12-06 MED FILL — CYCLOSPORINE MODIFIED 25 MG CAPSULE: ORAL | 30 days supply | Qty: 120 | Fill #2

## 2021-12-09 ENCOUNTER — Ambulatory Visit: Admit: 2021-12-09 | Discharge: 2021-12-10 | Payer: MEDICARE | Attending: Surgery | Primary: Surgery

## 2021-12-09 DIAGNOSIS — R0600 Dyspnea, unspecified: Principal | ICD-10-CM

## 2021-12-09 DIAGNOSIS — Z01818 Encounter for other preprocedural examination: Principal | ICD-10-CM

## 2021-12-09 DIAGNOSIS — N184 Chronic kidney disease, stage 4 (severe): Principal | ICD-10-CM

## 2021-12-16 NOTE — Unmapped (Signed)
Kidney transplant surgery eval      Past Medical History:   Diagnosis Date   ??? Chronic kidney disease    ??? Cirrhosis (CMS-HCC)    ??? HTN (hypertension)      Past Surgical History:   Procedure Laterality Date   ??? BRONCHOSCOPY  06/03/2018        ??? HYSTERECTOMY     ??? 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 PR TRACHEOSTOMY, PLANNED Midline 06/26/2018    Procedure: TRACHEOSTOMY PLANNED (SEPART PROC);  Surgeon: Theodosia Blender, MD;  Location: MAIN OR [facility];  Service: ENT   ??? TONSILLECTOMY       Current Outpatient Medications   Medication Instructions   ??? acetaminophen (TYLENOL) 650 mg, G-tube, Every 6 hours PRN   ??? albuterol 2.5 mg, Nebulization, Every 6 hours PRN   ??? allopurinoL (ZYLOPRIM) 100 mg, Oral, Daily PRN   ??? amLODIPine (NORVASC) 5 mg, Oral, Daily   ??? calcitonin, salmon, (MIACALCIN) 200 unit/actuation nasal spray 1 spray, Alternating Nares, Daily (RT)   ??? celecoxib (CELEBREX) 100 MG capsule TAKE 1 (ONE) CAPSULE BY MOUTH TWICE A DAY, MAX DAILY DOSE  2 CAPSULE   ??? cetirizine (ZYRTEC) 5 MG tablet TAKE 1 (ONE) TABLET BY MOUTH DAILY, MAX DAILY DOSE: 1 TABLET   ??? cycloSPORINE modified (NEORAL) 50 mg, Oral, 2 times a day (standard)   ??? dextrose (D10W) 10% Soln bolus 125 mL, Intravenous, Every 30 min PRN   ??? dextrose 5 % and sodium chloride 0.9 % (D5-NS) 25 mL/hr (25 mL/hr), Intravenous, Continuous PRN   ??? doxercalciferol (HECTOROL IV) 8 mcg   ??? famotidine (PEPCID) 20 mg, G-tube, Daily (standard)   ??? fluticasone propionate (FLONASE) 50 mcg/actuation nasal spray 1 spray, Nasal   ??? food supplemt, lactose-reduced (ENSURE) Liqd 120 mL, Oral   ??? gabapentin (NEURONTIN) 100 mg, Oral, Daily PRN   ??? gentamicin 1 mg/mL, sodium citrate 4% Gent locks with dialysis   ??? heparin (porcine) 5,000 Units, Subcutaneous, Every 8 hours   ??? heparin sodium,porcine (HEPARIN, PORCINE,) 1,000 unit/mL 1000 unit/mL injection This heparin is for dialysis use ONLY. RN if you are unable to aspirate from both lumes, call provider piror to using the catheter.   ??? iron sucrose 100 mg, OVERFILL 10 mL in sodium chloride 0.9 % 100 mL IVPB 50 mg   ??? levETIRAcetam (KEPPRA) 500 mg/100 mL PgBk 1 tablet   ??? lipase/protease/amylase (PANCRELIPASE 16,000 ORAL) Pancrelipase 24000-76000   ??? loratadine (CLARITIN) 10 mg, Oral, Daily PRN   ??? melatonin 3 mg, Oral, Nightly   ??? methoxy peg-epoetin beta (MIRCERA INJ) 60 mcg   ??? metoprolol succinate (TOPROL-XL) 50 mg, Daily (standard)   ??? ondansetron (ZOFRAN) 4 MG tablet TAKE 1 TABLET BY MOUTH EVERY EIGHT HOURS AS NEEDED NAUSEA/VOMITING   ??? oxyCODONE (ROXICODONE) 5 mg, G-tube, Every 6 hours PRN   ??? vitamin B complx-C-FA-zinc cit (DIALYVITE 800 WITH ZINC 15) 0.8-15 mg Tab Oral   ??? vitamin E, dl, acetate, 16.1 mg (50 unit)/mL Drop 1 capsule, Oral     BP 146/80  - Pulse 70  - Temp 37.4 ??C (99.4 ??F) (Tympanic)  - Ht 152.4 cm (5')  - Wt 50 kg (110 lb 4.8 oz)  - BMI 21.54 kg/m??     CTAB  RRR  s NTND    PLAN  Risks and benefits discussed for kidney transplant  6 minute walk and determine if any pre-hab warranted

## 2021-12-28 DIAGNOSIS — Z01818 Encounter for other preprocedural examination: Principal | ICD-10-CM

## 2021-12-28 DIAGNOSIS — Z992 Dependence on renal dialysis: Principal | ICD-10-CM

## 2021-12-28 DIAGNOSIS — Z7289 Other problems related to lifestyle: Principal | ICD-10-CM

## 2021-12-28 DIAGNOSIS — Z01810 Encounter for preprocedural cardiovascular examination: Principal | ICD-10-CM

## 2021-12-28 DIAGNOSIS — N186 End stage renal disease: Principal | ICD-10-CM

## 2021-12-28 DIAGNOSIS — R93429 Abnormal radiologic findings on diagnostic imaging of unspecified kidney: Principal | ICD-10-CM

## 2021-12-28 DIAGNOSIS — Z0000 Encounter for general adult medical examination without abnormal findings: Principal | ICD-10-CM

## 2021-12-28 DIAGNOSIS — N289 Disorder of kidney and ureter, unspecified: Principal | ICD-10-CM

## 2021-12-28 DIAGNOSIS — Z114 Encounter for screening for human immunodeficiency virus [HIV]: Principal | ICD-10-CM

## 2021-12-29 NOTE — Unmapped (Signed)
[facility] Refill Coordination Note    Specialty Medication(s) to be Shipped:   Transplant: cyclosporine 25mg     Other medication(s) to be shipped: No additional medications requested for fill at this time     Katherine Norton, DOB: Jun 19, 1956  Phone: [phone] (home)       All above HIPAA information was verified with patient.     Was a Nurse, learning disability used for this call? No    Completed refill call assessment today to schedule patient's medication shipment from the [facility] [phone]).  All relevant notes have been reviewed.     Specialty medication(s) and dose(s) confirmed: Regimen is correct and unchanged.   Changes to medications: Genese reports no changes at this time.  Changes to insurance: No  New side effects reported not previously addressed with a pharmacist or physician: None reported  Questions for the pharmacist: No    Confirmed patient received a Conservation officer, historic buildings and a Surveyor, mining with first shipment. The patient will receive a drug information handout for each medication shipped and additional FDA Medication Guides as required.       DISEASE/MEDICATION-SPECIFIC INFORMATION        N/A    SPECIALTY MEDICATION ADHERENCE     Medication Adherence    Patient reported X missed doses in the last month: 0  Specialty Medication: cycloSPORINE modified (NEORAL) 25 MG capsule  Patient is on additional specialty medications: No           cyclosporine 25mg   8 days worth of medication on hand.      Were doses missed due to medication being on hold? No         REFERRAL TO PHARMACIST     Referral to the pharmacist: Not needed      [facility]     Shipping address confirmed in Epic.     Delivery Scheduled: Yes, Expected medication delivery date: 12/31/21.     Medication will be delivered via UPS to the prescription address in Epic WAM.    [place] A Mat Stuard   [facility] Specialty Technician

## 2021-12-30 DIAGNOSIS — N184 Chronic kidney disease, stage 4 (severe): Principal | ICD-10-CM

## 2021-12-30 DIAGNOSIS — Z7289 Other problems related to lifestyle: Principal | ICD-10-CM

## 2021-12-30 DIAGNOSIS — Z114 Encounter for screening for human immunodeficiency virus [HIV]: Principal | ICD-10-CM

## 2021-12-30 DIAGNOSIS — R7989 Other specified abnormal findings of blood chemistry: Principal | ICD-10-CM

## 2021-12-30 DIAGNOSIS — N186 End stage renal disease: Principal | ICD-10-CM

## 2021-12-30 DIAGNOSIS — Z01818 Encounter for other preprocedural examination: Principal | ICD-10-CM

## 2021-12-30 DIAGNOSIS — Z01810 Encounter for preprocedural cardiovascular examination: Principal | ICD-10-CM

## 2021-12-30 DIAGNOSIS — R944 Abnormal results of kidney function studies: Principal | ICD-10-CM

## 2021-12-30 MED FILL — CYCLOSPORINE MODIFIED 25 MG CAPSULE: ORAL | 90 days supply | Qty: 360 | Fill #3

## 2022-01-06 NOTE — Unmapped (Signed)
Patient is financially cleared for Kidney transplant evaluation.    Insurance verified, patient has active coverage with [employer] Dual Complete Medicare/Medicaid plan with [employer] pharmacy plan. Per [employer] nurse CM Darol Destine , Kidney transplant evaluation and listing is approved with [employer] case #[id number] effective 12/21/21 and valid as long patient remains eligible for coverage.        Notify CM when patient is listed or no longer a candidate. Contact intake when patient is admitted for transplant event. Plan uses [employer] contract for transplant.

## 2022-02-09 NOTE — Unmapped (Signed)
1st attempt. LVM for pt to call and schedule appointments.

## 2022-02-17 NOTE — Unmapped (Signed)
Spoke with pt and confirmed 1st set of appointments on 03/29/22 and phone visits on 04/14/22. Confirmed address and sent letter.

## 2022-02-17 NOTE — Unmapped (Signed)
[facility] Clinical Assessment & Refill Coordination Note    Katherine Norton, [place]: [birth year]  Phone: [phone] (home)     All above HIPAA information was verified with patient.     Was a Nurse, learning disability used for this call? No    Specialty Medication(s):   Transplant: cyclosporine 25mg      Current Outpatient Medications   Medication Sig Dispense Refill    acetaminophen (TYLENOL) 325 MG tablet 2 tablets (650 mg total) by G-tube route every six (6) hours as needed. (Patient taking differently: Take 650 mg by mouth every six (6) hours as needed (headaches).)  0    albuterol 2.5 mg/0.5 mL nebulizer solution Inhale 0.5 mL (2.5 mg total) by nebulization every six (6) hours as needed for wheezing or shortness of breath. 30 each 12    allopurinoL (ZYLOPRIM) 100 MG tablet Take 100 mg by mouth daily as needed.      amLODIPine (NORVASC) 5 MG tablet Take 5 mg by mouth daily.      calcitonin, salmon, (MIACALCIN) 200 unit/actuation nasal spray 1 spray by Alternating Nares route once daily.       celecoxib (CELEBREX) 100 MG capsule TAKE 1 (ONE) CAPSULE BY MOUTH TWICE A DAY, MAX DAILY DOSE  2 CAPSULE      cetirizine (ZYRTEC) 5 MG tablet TAKE 1 (ONE) TABLET BY MOUTH DAILY, MAX DAILY DOSE: 1 TABLET      cycloSPORINE modified (NEORAL) 25 MG capsule Take 2 capsules (50 mg total) by mouth Two (2) times a day. 360 capsule 3    dextrose (D10W) 10% Soln bolus Infuse 125 mL into a venous catheter every thirty (30) minutes as needed.  0    dextrose 5 % and sodium chloride 0.9 % (D5-NS) infu Infuse 25 mL/hr into a venous catheter continuous as needed (if tube feed held or NPO).  0    famotidine (PEPCID) 20 MG tablet 1 tablet (20 mg total) by G-tube route daily. (Patient taking differently: 20 mg  in the morning.) 30 tablet 0    fluticasone propionate (FLONASE) 50 mcg/actuation nasal spray 1 spray into each nostril.      food supplemt, lactose-reduced (ENSURE) Liqd Take 120 mL by mouth.      gabapentin (NEURONTIN) 100 MG capsule Take 100 mg by mouth as needed in the morning.      gentamicin 1 mg/mL, sodium citrate 4% Gent locks with dialysis 2.4 mL 4    heparin sodium,porcine (HEPARIN, PORCINE,) 1,000 unit/mL 1000 unit/mL injection This heparin is for dialysis use ONLY. RN if you are unable to aspirate from both lumes, call provider piror to using the catheter. 1 mL 0    heparin sodium,porcine (HEPARIN, PORCINE,) 5,000 unit/mL injection Inject 1 mL (5,000 Units total) under the skin every eight (8) hours. 1 mL 0    iron sucrose 100 mg, OVERFILL 10 mL in sodium chloride 0.9 % 100 mL IVPB 50 mg.      levETIRAcetam (KEPPRA) 500 mg/100 mL PgBk 1 tablet.      lipase/protease/amylase (PANCRELIPASE 16,000 ORAL) Pancrelipase 24000-76000      loratadine (CLARITIN) 10 mg tablet Take 10 mg by mouth daily as needed.      melatonin 3 mg Tab Take 1 tablet (3 mg total) by mouth nightly.  0    methoxy peg-epoetin beta (MIRCERA INJ) 60 mcg.      metoprolol succinate (TOPROL-XL) 50 MG 24 hr tablet 50 mg  in the morning.  ondansetron (ZOFRAN) 4 MG tablet TAKE 1 TABLET BY MOUTH EVERY EIGHT HOURS AS NEEDED NAUSEA/VOMITING      oxyCODONE (ROXICODONE) 5 MG immediate release tablet 1 tablet (5 mg total) by G-tube route every six (6) hours as needed. for up to 5 days (Patient taking differently: Take 5 mg by mouth every six (6) hours as needed.) 10 tablet 0    vitamin B complx-C-FA-zinc cit (DIALYVITE 800 WITH ZINC 15) 0.8-15 mg Tab Take by mouth.      vitamin E, dl, acetate, 16.1 mg (50 unit)/mL Drop Take 1 capsule by mouth.       No current facility-administered medications for this visit.        Changes to medications: [name] reports no changes at this time.    Allergies   Allergen Reactions    Iodinated Contrast Media     Ioxaglate Sodium     Latex     Succinylcholine Chloride      Other reaction(s): Other (See Comments)       Changes to allergies: No    SPECIALTY MEDICATION ADHERENCE     Cyclosporine 25 mg: 30 days of medicine on hand     Medication Adherence    Patient reported X missed doses in the last month: 0  Specialty Medication: Cyclosporine 25mg   Patient is on additional specialty medications: No          Specialty medication(s) dose(s) confirmed: Regimen is correct and unchanged.     Are there any concerns with adherence? No    Adherence counseling provided? Not needed    CLINICAL MANAGEMENT AND INTERVENTION      Clinical Benefit Assessment:    Do you feel the medicine is effective or helping your condition? Yes    Clinical Benefit counseling provided? Not needed    Adverse Effects Assessment:    Are you experiencing any side effects? No    Are you experiencing difficulty administering your medicine? No    Quality of Life Assessment:           How many days over the past month did your liver transplant  keep you from your normal activities? For example, brushing your teeth or getting up in the morning. 0    Have you discussed this with your provider? Not needed    Acute Infection Status:    Acute infections noted within Epic:  No active infections  Patient reported infection: None    Therapy Appropriateness:    Is therapy appropriate and patient progressing towards therapeutic goals? Yes, therapy is appropriate and should be continued    DISEASE/MEDICATION-SPECIFIC INFORMATION      N/A    PATIENT SPECIFIC NEEDS     Does the patient have any physical, cognitive, or cultural barriers? No    Is the patient high risk? No    Does the patient require a Care Management Plan? No     SOCIAL DETERMINANTS OF HEALTH     At the [facility], we have learned that life circumstances - like trouble affording food, housing, utilities, or transportation can affect the health of many of our patients.   That is why we wanted to ask: are you currently experiencing any life circumstances that are negatively impacting your health and/or quality of life? No    Social Determinants of Psychologist, prison and probation services Strain: Not on file   Internet Connectivity: Not on file   Food Insecurity: Not on file   Tobacco Use: Low  Risk     Smoking Tobacco Use: Never    Smokeless Tobacco Use: Never    Passive Exposure: Never   Housing/Utilities: Not on file   Alcohol Use: Not on file   Transportation Needs: Not on file   Substance Use: Not on file   Health Literacy: Not on file   Physical Activity: Not on file   Interpersonal Safety: Not on file   Stress: Not on file   Intimate Partner Violence: Not on file   Depression: Not at risk    PHQ-2 Score: 0   Social Connections: Not on file       Would you be willing to receive help with any of the needs that you have identified today? Not applicable       SHIPPING     Specialty Medication(s) to be Shipped:   Transplant: None- patient has 1 month supply on hand of cyclosporine    Other medication(s) to be shipped: No additional medications requested for fill at this time     Changes to insurance: No    Delivery Scheduled: Patient declined refill at this time due to has a month supply on hand..     Medication will be delivered via UPS to the confirmed prescription address in [facility].    The patient will receive a drug information handout for each medication shipped and additional FDA Medication Guides as required.  Verified that patient has previously received a Conservation officer, historic buildings and a Surveyor, mining.    The patient or caregiver noted above participated in the development of this care plan and knows that they can request review of or adjustments to the care plan at any time.      All of the patient's questions and concerns have been addressed.    Tera Helper   [facility] Specialty Pharmacist

## 2022-03-01 ENCOUNTER — Other Ambulatory Visit: Payer: Self-pay | Admitting: Neurology

## 2022-03-01 MED ORDER — LEVETIRACETAM 500 MG PO TABS: ORAL_TABLET | ORAL | 1 refills | Status: DC

## 2022-03-17 NOTE — Unmapped (Signed)
[facility] Refill Coordination Note    Specialty Medication(s) to be Shipped:   Transplant: cyclosporine 25mg     Other medication(s) to be shipped: No additional medications requested for fill at this time     Trenace Coughlin, DOB: 05/28/1956  Phone: [phone] (home)       All above HIPAA information was verified with patient.     Was a Nurse, learning disability used for this call? No    Completed refill call assessment today to schedule patient's medication shipment from the [facility] [phone]).  All relevant notes have been reviewed.     Specialty medication(s) and dose(s) confirmed: Regimen is correct and unchanged.   Changes to medications: Kyleen reports no changes at this time.  Changes to insurance: No  New side effects reported not previously addressed with a pharmacist or physician: None reported  Questions for the pharmacist: No    Confirmed patient received a Conservation officer, historic buildings and a Surveyor, mining with first shipment. The patient will receive a drug information handout for each medication shipped and additional FDA Medication Guides as required.       DISEASE/MEDICATION-SPECIFIC INFORMATION        N/A    SPECIALTY MEDICATION ADHERENCE     Medication Adherence    Patient reported X missed doses in the last month: 0  Specialty Medication: cyclosporine 25mg               Were doses missed due to medication being on hold? No    Cyclosporine 25mg   : 12 days of medicine on hand       REFERRAL TO PHARMACIST     Referral to the pharmacist: Not needed      [facility]     Shipping address confirmed in Epic.     Delivery Scheduled: Yes, Expected medication delivery date: 03/25/2022.     Medication will be delivered via UPS to the prescription address in Epic WAM.    Thad Ranger   [facility] Specialty Pharmacist

## 2022-03-24 DIAGNOSIS — Z796 Long-term use of immunosuppressant medication: Principal | ICD-10-CM

## 2022-03-24 DIAGNOSIS — Z944 Liver transplant status: Principal | ICD-10-CM

## 2022-03-24 MED ORDER — CYCLOSPORINE MODIFIED 25 MG CAPSULE
ORAL_CAPSULE | Freq: Two times a day (BID) | ORAL | 3 refills | 90 days | Status: CP
Start: 2022-03-24 — End: 2023-03-24
  Filled 2022-03-24: qty 360, 90d supply, fill #0

## 2022-03-29 ENCOUNTER — Ambulatory Visit: Admit: 2022-03-29 | Payer: MEDICAID

## 2022-03-29 ENCOUNTER — Ambulatory Visit: Admit: 2022-03-29 | Discharge: 2022-03-31 | Payer: MEDICAID

## 2022-03-30 NOTE — Unmapped (Signed)
6/7 - Contacted pt to schedule annual liver txp follow up, per call pt states she is on her way to an appt asked this TPA to call the following day.

## 2022-03-31 NOTE — Unmapped (Signed)
Attempted to return patients voicemail, no answer, left voicemail message.

## 2022-04-05 ENCOUNTER — Encounter: Payer: Self-pay | Admitting: Hematology & Oncology

## 2022-04-08 NOTE — Unmapped (Signed)
1st attempt. Lvm for pt to reschedule appointments.

## 2022-04-11 NOTE — Progress Notes (Deleted)
[facility]ISTS OF [place]  ASSESSMENT / PLAN: 66 y.o. female with *** - ***  CHIEF COMPLAINT: ***  HISTORY OF PRESENT ILLNESS: Tami [name] is a 66 y.o. female ***  VASCULAR SURGICAL HISTORY: ***  VASCULAR RISK FACTORS: {FINDINGS; POSITIVE NEGATIVE:734 811 7092} history of stroke / transient ischemic attack. {FINDINGS; POSITIVE NEGATIVE:734 811 7092} history of coronary artery disease. *** history of PCI. *** history of CABG.  {FINDINGS; POSITIVE NEGATIVE:734 811 7092} history of diabetes mellitus. Last A1c ***. {FINDINGS; POSITIVE NEGATIVE:734 811 7092} history of smoking. *** actively smoking. {FINDINGS; POSITIVE NEGATIVE:734 811 7092} history of hypertension. *** drug regimen with *** control. {FINDINGS; POSITIVE NEGATIVE:734 811 7092} history of chronic kidney disease.  Last GFR ***. CKD {stage:30421363}. {FINDINGS; POSITIVE NEGATIVE:734 811 7092} history of chronic obstructive pulmonary disease, treated with ***.  FUNCTIONAL STATUS: ECOG performance status: {findings; ecog performance status:31780} Ambulatory status: {TNHAmbulation:25868}  Past Medical History:  Diagnosis Date   Anemia    Arthritis    ESRD (end stage renal disease) on dialysis ([place])    "MWF; [place]" (10/04/2018)   Gout    Hepatitis, autoimmune ([place]) 12/08/2011   Hypertension    Seizures ([place]) 10/03/2018    Past Surgical History:  Procedure Laterality Date   ABDOMINAL HYSTERECTOMY     AV FISTULA PLACEMENT Left 10/02/2018   Procedure: Creation of left arm Brachiocephalic Fistula;  Surgeon: Waynetta Sandy, MD;  Location: [facility];  Service: Vascular;  Laterality: Left;   FISTULA SUPERFICIALIZATION Left 11/29/2018   Procedure: TRANSLOCATION/FISTULA SUPERFICIALIZATION OF LEFT ARM FISTULA;  Surgeon: Marty Heck, MD;  Location: [facility];  Service: Vascular;  Laterality: Left;   LIVER TRANSPLANT     1996   TONSILLECTOMY      Family History  Problem Relation Age of Onset   Stroke  Mother    Cancer Father    Lupus Sister        1 sister   Hypertension Sister    Cancer Brother        prostate- 1 brother   Lupus Brother     Social History   Socioeconomic History   Marital status: Divorced    Spouse name: Not on file   Number of children: 2   Years of education: some college   Highest education level: Not on file  Occupational History    Comment: na  Tobacco Use   Smoking status: Never   Smokeless tobacco: Never   Tobacco comments:    never used tobacco  Vaping Use   Vaping Use: Never used  Substance and Sexual Activity   Alcohol use: Not Currently    Alcohol/week: 0.0 standard drinks of alcohol   Drug use: No   Sexual activity: Not on file  Other Topics Concern   Not on file  Social History Narrative   Lives with son   Caffeine - tea , little    Social Determinants of Health   Financial Resource Strain: Not on file  Food Insecurity: Not on file  Transportation Needs: Not on file  Physical Activity: Not on file  Stress: Not on file  Social Connections: Not on file  Intimate Partner Violence: Not on file    Allergies  Allergen Reactions   Iodinated Contrast Media Other (See Comments)    UNSPECIFIED REACTION    Ioxaglate     UNSPECIFIED REACTION    Latex     UNSPECIFIED REACTION    Quelicin  [Succinylcholine Chloride] Other (See Comments)    Current Outpatient Medications  Medication Sig Dispense Refill   allopurinol (ZYLOPRIM)  100 MG tablet Take 100 mg by mouth daily.  5   amLODipine (NORVASC) 5 MG tablet Take 5 mg by mouth daily.     B Complex-C-Zn-Folic Acid (DIALYVITE 606-TKZS 15) 0.8 MG TABS Take by mouth.     cycloSPORINE modified (NEORAL) 25 MG capsule Take 50 mg by mouth 2 (two) times daily.      ENSURE (ENSURE) Take 1 Can by mouth daily.      fluticasone (FLONASE) 50 MCG/ACT nasal spray Place into the nose.     folic acid (FOLVITE) 1 MG tablet Take 1 mg by mouth daily.     gabapentin (NEURONTIN) 100 MG capsule TAKE 2  CAPSULE BY MOUTH TWICE A DAY AS NEEDED TAKE FOR PAIN     labetalol (NORMODYNE) 100 MG tablet Take 100 mg by mouth 2 (two) times daily.     levETIRAcetam (KEPPRA) 500 MG tablet Take 1 tablet Daily (Take additional 1 tablet at 6 pm on Mon,Wed and Friday) 125 tablet 1   lidocaine-prilocaine (EMLA) cream APPLY SMALL AMOUNT TO ACCESS SITE (AVF) 30 - 60 MINUTES BEFORE DIALYSIS. COVER WITH OCCLUSIVE DRESSING ( SARAN WRAP)     loratadine (CLARITIN) 10 MG tablet Take 10 mg by mouth daily. X 10 days     metoprolol succinate (TOPROL-XL) 50 MG 24 hr tablet 1 (ONE) TABLET TABLET ER 24HR DAILY     ondansetron (ZOFRAN ODT) 4 MG disintegrating tablet Dissolve 1 tablet (4 mg total) by mouth every 8 (eight) hours as needed for nausea or vomiting. 20 tablet 0   Oxycodone HCl 10 MG TABS Take 1 tablet (10 mg total) by mouth every 6 (six) hours as needed for up to 10 doses (severe pain). - hold for sedation (Patient taking differently: Take 10 mg by mouth every 4 (four) hours as needed (severe pain). - hold for sedation) 10 tablet 0   Pancrelipase, Lip-Prot-Amyl, 24000-76000 units CPEP Pancrelipase 24000-76000     pantoprazole (PROTONIX) 40 MG tablet Take by mouth.     PARoxetine (PAXIL) 10 MG tablet Take by mouth.     promethazine (PHENERGAN) 12.5 MG tablet Take 12.5 mg by mouth daily.     RENAGEL 800 MG tablet TAKE 2 TABLETS BY MOUTH THREE TIMES DAILY WITH MEALS     Vitamin D, Ergocalciferol, (DRISDOL) 1.25 MG (50000 UT) CAPS capsule Take 50,000 Units by mouth once a week. Sunday     No current facility-administered medications for this visit.    PHYSICAL EXAM There were no vitals filed for this visit.  Constitutional: *** appearing. *** distress. Appears *** nourished.  Neurologic: CN ***. *** focal findings. *** sensory loss. Psychiatric: *** Mood and affect symmetric and appropriate. Eyes: *** No icterus. No conjunctival pallor. Ears, nose, throat: *** mucous membranes moist. Midline trachea.  Cardiac: ***  rate and rhythm.  Respiratory: *** unlabored. Abdominal: *** soft, non-tender, non-distended.  Peripheral vascular: *** Extremity: *** edema. *** cyanosis. *** pallor.  Skin: *** gangrene. *** ulceration.  Lymphatic: *** Stemmer's sign. *** palpable lymphadenopathy.    PERTINENT LABORATORY AND RADIOLOGIC DATA  Most recent CBC    Latest Ref Rng & Units 02/20/2020    2:11 PM 08/20/2019    3:00 PM 02/12/2019   11:03 AM  CBC  WBC 4.0 - 10.5 K/uL 4.5  4.3  2.7   Hemoglobin 12.0 - 15.0 g/dL 10.5  11.3  9.1   Hematocrit 36.0 - 46.0 % 32.8  36.6  30.0   Platelets 150 - 400 K/uL 106  216  168      Most recent CMP    Latest Ref Rng & Units 02/20/2020    2:11 PM 08/20/2019    3:00 PM 02/12/2019   11:03 AM  CMP  Glucose 70 - 99 mg/dL 116  101  96   BUN 8 - 23 mg/dL 13  21  11    Creatinine 0.44 - 1.00 mg/dL 4.00  3.56  3.69   Sodium 135 - 145 mmol/L 137  138  138   Potassium 3.5 - 5.1 mmol/L 2.8  4.7  3.3   Chloride 98 - 111 mmol/L 92  98  94   CO2 22 - 32 mmol/L 32  32  34   Calcium 8.9 - 10.3 mg/dL 9.3  9.5  8.3   Total Protein 6.5 - 8.1 g/dL 7.9  8.2  7.8   Total Bilirubin 0.3 - 1.2 mg/dL 0.5  0.3  0.6   Alkaline Phos 38 - 126 U/L 71  72  70   AST 15 - 41 U/L 15  14  12    ALT 0 - 44 U/L 5  10  4      Renal function CrCl cannot be calculated (Patient's most recent lab result is older than the maximum 21 days allowed.).  No results found for: "HGBA1C"  LDL Cholesterol  Date Value Ref Range Status  10/03/2018 73 0 - 99 mg/dL Final    Comment:           Total Cholesterol/HDL:CHD Risk Coronary Heart Disease Risk Table                     Men   Women  1/2 Average Risk   3.4   3.3  Average Risk       5.0   4.4  2 X Average Risk   9.6   7.1  3 X Average Risk  23.4   11.0        Use the calculated Patient Ratio above and the CHD Risk Table to determine the patient's CHD Risk.        ATP III CLASSIFICATION (LDL):  <100     mg/dL   Optimal  100-129  mg/dL   Near or  Above                    Optimal  130-159  mg/dL   Borderline  160-189  mg/dL   High  >190     mg/dL   Very High Performed at [facility] [address], [place], [place] [address]      Vascular Imaging: ***  Yevonne Aline. Stanford Breed, MD [facility] Phone Number: [phone] 04/11/2022 9:21 PM  Total time spent on preparing this encounter including chart review, data review, collecting history, examining the patient, coordinating care for this {tnhtimebilling:26202}  Portions of this report may have been transcribed using voice recognition software.  Every effort has been made to ensure accuracy; however, inadvertent computerized transcription errors may still be present.

## 2022-04-12 ENCOUNTER — Encounter: Payer: Medicare Other | Admitting: Vascular Surgery

## 2022-04-12 NOTE — Unmapped (Signed)
6/20 - Contacted pt to schedule annual liver txp follow up, LVM for pt to return ph call.

## 2022-04-14 ENCOUNTER — Institutional Professional Consult (permissible substitution): Admit: 2022-04-14 | Discharge: 2022-04-15 | Payer: MEDICARE

## 2022-04-14 ENCOUNTER — Ambulatory Visit: Admit: 2022-04-14 | Discharge: 2022-04-15 | Payer: MEDICARE | Attending: Psychologist | Primary: Psychologist

## 2022-04-14 NOTE — Unmapped (Signed)
TFC, Consuella Lose had a phone consultation with patient as part of Wait List Review.  The patient confirmed her date of birth and consented to performing the [facility] consultation via phone. Patient currently has [facility] Medicare Dual complete plan with Medicaid secondary payer.  Prescription drug coverage with [employer] and qualifies for extra help.   I discussed the Financial Agreement with the patient and she acknowledged that she understood and had no questions at this time. Informed her to contact Vic Ripper if she has any future questions.

## 2022-04-14 NOTE — Unmapped (Signed)
Patient called in to reschedule appointments. Scheduled while on the phone. Patient good with new date and time. Letter sent

## 2022-04-15 NOTE — Unmapped (Signed)
Transplant Social Work Progress Note    CSW contacted patient at scheduled appointment time to complete SW evaluation. Patient was home and available. However, she did not realize she needed to have a caregiver present for the evaluation. Patient reports her brother would be her caregiver and he was at work. CSW advised patient to reschedule SW appointment once she can confirm with her bother potential dates. Patient reports understanding of this. Patient reports she would not have anyone available for her psychology appointment which was scheduled after SW appointment. CSW advised that the psychologist would be notified. CSW to also notify [facility] that patient needs to be rescheduled.

## 2022-04-19 NOTE — Unmapped (Signed)
6/27 - Contacted pt to schedule annual liver txp follow up, per call able to find a date that is either T/TR that works for pt's Dialysis schedule and informed pt to make sure to have labs done a week prior to appt in person. Pt okay with appt letter by mail and verbalized understanding all discussed.

## 2022-04-28 ENCOUNTER — Ambulatory Visit (INDEPENDENT_AMBULATORY_CARE_PROVIDER_SITE_OTHER): Payer: Medicare Other | Admitting: Vascular Surgery

## 2022-04-28 ENCOUNTER — Encounter: Payer: Self-pay | Admitting: Vascular Surgery

## 2022-04-28 VITALS — BP 127/84 | HR 89 | Temp 97.7°F | Resp 18 | Ht 60.0 in | Wt 108.5 lb

## 2022-04-28 DIAGNOSIS — N186 End stage renal disease: Secondary | ICD-10-CM

## 2022-04-28 DIAGNOSIS — Z992 Dependence on renal dialysis: Secondary | ICD-10-CM

## 2022-04-28 NOTE — H&P (View-Only) (Signed)
REASON FOR VISIT:   Ulceration of AV fistula.  The consult is requested by Dr. Otelia Santee.  MEDICAL ISSUES:   ULCERATION OVERLYING LEFT AV FISTULA: This patient has a superficial ulceration overlying her left upper arm fistula.  The fistula has been working well.  I think this can be addressed by simply excising the ulcer and closing the incision over the aneurysm without having to plicate the aneurysm.  Even if that was necessary I think there is enough room above and below this that they can continue to use the graft for dialysis.  She dialyzes on Monday Wednesdays and Fridays.  This has been scheduled for Tuesday, 05/10/2022.  We have discussed the indications for the procedure and the potential complications and she is agreeable to proceed.  HPI:   SAPIR LAVEY is a pleasant 66 y.o. female who presents for evaluation of an ulceration overlying her AV fistula.  She underwent superficialization of left brachiocephalic fistula on November 29, 2018 with a sidebranch ligation.   The patient notes that she has had the superficial ulceration overlying her fistula that will not heal.  This is been present for a couple of months.  She dialyzes on Monday Wednesdays and Fridays.  She states that her fistula has been working well.  Past Medical History:  Diagnosis Date   Anemia    Arthritis    ESRD (end stage renal disease) on dialysis ([place])    "MWF; [place]" (10/04/2018)   Gout    Hepatitis, autoimmune ([place]) 12/08/2011   Hypertension    Seizures ([place]) 10/03/2018    Family History  Problem Relation Age of Onset   Stroke Mother    Cancer Father    Lupus Sister        1 sister   Hypertension Sister    Cancer Brother        prostate- 1 brother   Lupus Brother     SOCIAL HISTORY: Social History   Tobacco Use   Smoking status: Never   Smokeless tobacco: Never   Tobacco comments:    never used tobacco  Substance Use Topics   Alcohol use: Not Currently    Alcohol/week: 0.0  standard drinks of alcohol    Allergies  Allergen Reactions   Iodinated Contrast Media Other (See Comments)    UNSPECIFIED REACTION    Ioxaglate     UNSPECIFIED REACTION    Latex     UNSPECIFIED REACTION    Quelicin  [Succinylcholine Chloride] Other (See Comments)    Current Outpatient Medications  Medication Sig Dispense Refill   allopurinol (ZYLOPRIM) 100 MG tablet Take 100 mg by mouth daily.  5   amLODipine (NORVASC) 5 MG tablet Take 5 mg by mouth daily.     B Complex-C-Zn-Folic Acid (DIALYVITE 509-TOIZ 15) 0.8 MG TABS Take by mouth.     cycloSPORINE modified (NEORAL) 25 MG capsule Take 50 mg by mouth 2 (two) times daily.      ENSURE (ENSURE) Take 1 Can by mouth daily.      fluticasone (FLONASE) 50 MCG/ACT nasal spray Place into the nose.     folic acid (FOLVITE) 1 MG tablet Take 1 mg by mouth daily.     gabapentin (NEURONTIN) 100 MG capsule TAKE 2 CAPSULE BY MOUTH TWICE A DAY AS NEEDED TAKE FOR PAIN     labetalol (NORMODYNE) 100 MG tablet Take 100 mg by mouth 2 (two) times daily.     levETIRAcetam (KEPPRA) 500 MG tablet Take  1 tablet Daily (Take additional 1 tablet at 6 pm on Mon,Wed and Friday) 125 tablet 1   lidocaine-prilocaine (EMLA) cream APPLY SMALL AMOUNT TO ACCESS SITE (AVF) 30 - 60 MINUTES BEFORE DIALYSIS. COVER WITH OCCLUSIVE DRESSING ( SARAN WRAP)     metoprolol succinate (TOPROL-XL) 50 MG 24 hr tablet 1 (ONE) TABLET TABLET ER 24HR DAILY     ondansetron (ZOFRAN ODT) 4 MG disintegrating tablet Dissolve 1 tablet (4 mg total) by mouth every 8 (eight) hours as needed for nausea or vomiting. 20 tablet 0   Oxycodone HCl 10 MG TABS Take 1 tablet (10 mg total) by mouth every 6 (six) hours as needed for up to 10 doses (severe pain). - hold for sedation (Patient taking differently: Take 10 mg by mouth every 4 (four) hours as needed (severe pain). - hold for sedation) 10 tablet 0   Pancrelipase, Lip-Prot-Amyl, 24000-76000 units CPEP Pancrelipase 24000-76000     pantoprazole  (PROTONIX) 40 MG tablet Take by mouth.     PARoxetine (PAXIL) 10 MG tablet Take by mouth.     promethazine (PHENERGAN) 12.5 MG tablet Take 12.5 mg by mouth daily.     RENAGEL 800 MG tablet TAKE 2 TABLETS BY MOUTH THREE TIMES DAILY WITH MEALS     Vitamin D, Ergocalciferol, (DRISDOL) 1.25 MG (50000 UT) CAPS capsule Take 50,000 Units by mouth once a week. Sunday     loratadine (CLARITIN) 10 MG tablet Take 10 mg by mouth daily. X 10 days     No current facility-administered medications for this visit.    REVIEW OF SYSTEMS:  [X]  denotes positive finding, [ ]  denotes negative finding Cardiac  Comments:  Chest pain or chest pressure:    Shortness of breath upon exertion:    Short of breath when lying flat:    Irregular heart rhythm:        Vascular    Pain in calf, thigh, or hip brought on by ambulation:    Pain in feet at night that wakes you up from your sleep:     Blood clot in your veins:    Leg swelling:         Pulmonary    Oxygen at home:    Productive cough:     Wheezing:         Neurologic    Sudden weakness in arms or legs:     Sudden numbness in arms or legs:     Sudden onset of difficulty speaking or slurred speech:    Temporary loss of vision in one eye:     Problems with dizziness:         Gastrointestinal    Blood in stool:     Vomited blood:         Genitourinary    Burning when urinating:     Blood in urine:        Psychiatric    Major depression:         Hematologic    Bleeding problems:    Problems with blood clotting too easily:        Skin    Rashes or ulcers:        Constitutional    Fever or chills:     PHYSICAL EXAM:   Vitals:   04/28/22 1322  BP: 127/84  Pulse: 89  Resp: 18  Temp: 97.7 F (36.5 C)  SpO2: 96%  Weight: 108 lb 8 oz (49.2 kg)  Height: 5' (1.524 m)  GENERAL: The patient is a well-nourished female, in no acute distress. The vital signs are documented above. CARDIAC: There is a regular rate and rhythm.  VASCULAR:  She has a good thrill in her fistula which is slightly pulsatile. There is a superficial ulceration over an aneurysmal segment in the mid upper arm.   PULMONARY: There is good air exchange bilaterally without wheezing or rales. MUSCULOSKELETAL: There are no major deformities or cyanosis. NEUROLOGIC: No focal weakness or paresthesias are detected. SKIN: There are no ulcers or rashes noted. PSYCHIATRIC: The patient has a normal affect.  DATA:    No new data  [name] [facility] [phone]

## 2022-04-28 NOTE — Progress Notes (Signed)
REASON FOR VISIT:   Ulceration of AV fistula.  The consult is requested by Dr. Otelia Santee.  MEDICAL ISSUES:   ULCERATION OVERLYING LEFT AV FISTULA: This patient has a superficial ulceration overlying her left upper arm fistula.  The fistula has been working well.  I think this can be addressed by simply excising the ulcer and closing the incision over the aneurysm without having to plicate the aneurysm.  Even if that was necessary I think there is enough room above and below this that they can continue to use the graft for dialysis.  She dialyzes on Monday Wednesdays and Fridays.  This has been scheduled for Tuesday, 05/10/2022.  We have discussed the indications for the procedure and the potential complications and she is agreeable to proceed.  HPI:   Tami [name] is a pleasant 66 y.o. female who presents for evaluation of an ulceration overlying her AV fistula.  She underwent superficialization of left brachiocephalic fistula on November 29, 2018 with a sidebranch ligation.   The patient notes that she has had the superficial ulceration overlying her fistula that will not heal.  This is been present for a couple of months.  She dialyzes on Monday Wednesdays and Fridays.  She states that her fistula has been working well.  Past Medical History:  Diagnosis Date   Anemia    Arthritis    ESRD (end stage renal disease) on dialysis ([place])    "MWF; [place]" (10/04/2018)   Gout    Hepatitis, autoimmune ([place]) 12/08/2011   Hypertension    Seizures ([place]) 10/03/2018    Family History  Problem Relation Age of Onset   Stroke Mother    Cancer Father    Lupus Sister        1 sister   Hypertension Sister    Cancer Brother        prostate- 1 brother   Lupus Brother     SOCIAL HISTORY: Social History   Tobacco Use   Smoking status: Never   Smokeless tobacco: Never   Tobacco comments:    never used tobacco  Substance Use Topics   Alcohol use: Not Currently    Alcohol/week: 0.0  standard drinks of alcohol    Allergies  Allergen Reactions   Iodinated Contrast Media Other (See Comments)    UNSPECIFIED REACTION    Ioxaglate     UNSPECIFIED REACTION    Latex     UNSPECIFIED REACTION    Quelicin  [Succinylcholine Chloride] Other (See Comments)    Current Outpatient Medications  Medication Sig Dispense Refill   allopurinol (ZYLOPRIM) 100 MG tablet Take 100 mg by mouth daily.  5   amLODipine (NORVASC) 5 MG tablet Take 5 mg by mouth daily.     B Complex-C-Zn-Folic Acid (DIALYVITE 644-IHKV 15) 0.8 MG TABS Take by mouth.     cycloSPORINE modified (NEORAL) 25 MG capsule Take 50 mg by mouth 2 (two) times daily.      ENSURE (ENSURE) Take 1 Can by mouth daily.      fluticasone (FLONASE) 50 MCG/ACT nasal spray Place into the nose.     folic acid (FOLVITE) 1 MG tablet Take 1 mg by mouth daily.     gabapentin (NEURONTIN) 100 MG capsule TAKE 2 CAPSULE BY MOUTH TWICE A DAY AS NEEDED TAKE FOR PAIN     labetalol (NORMODYNE) 100 MG tablet Take 100 mg by mouth 2 (two) times daily.     levETIRAcetam (KEPPRA) 500 MG tablet Take  1 tablet Daily (Take additional 1 tablet at 6 pm on Mon,Wed and Friday) 125 tablet 1   lidocaine-prilocaine (EMLA) cream APPLY SMALL AMOUNT TO ACCESS SITE (AVF) 30 - 60 MINUTES BEFORE DIALYSIS. COVER WITH OCCLUSIVE DRESSING ( SARAN WRAP)     metoprolol succinate (TOPROL-XL) 50 MG 24 hr tablet 1 (ONE) TABLET TABLET ER 24HR DAILY     ondansetron (ZOFRAN ODT) 4 MG disintegrating tablet Dissolve 1 tablet (4 mg total) by mouth every 8 (eight) hours as needed for nausea or vomiting. 20 tablet 0   Oxycodone HCl 10 MG TABS Take 1 tablet (10 mg total) by mouth every 6 (six) hours as needed for up to 10 doses (severe pain). - hold for sedation (Patient taking differently: Take 10 mg by mouth every 4 (four) hours as needed (severe pain). - hold for sedation) 10 tablet 0   Pancrelipase, Lip-Prot-Amyl, 24000-76000 units CPEP Pancrelipase 24000-76000     pantoprazole  (PROTONIX) 40 MG tablet Take by mouth.     PARoxetine (PAXIL) 10 MG tablet Take by mouth.     promethazine (PHENERGAN) 12.5 MG tablet Take 12.5 mg by mouth daily.     RENAGEL 800 MG tablet TAKE 2 TABLETS BY MOUTH THREE TIMES DAILY WITH MEALS     Vitamin D, Ergocalciferol, (DRISDOL) 1.25 MG (50000 UT) CAPS capsule Take 50,000 Units by mouth once a week. Sunday     loratadine (CLARITIN) 10 MG tablet Take 10 mg by mouth daily. X 10 days     No current facility-administered medications for this visit.    REVIEW OF SYSTEMS:  [X]  denotes positive finding, [ ]  denotes negative finding Cardiac  Comments:  Chest pain or chest pressure:    Shortness of breath upon exertion:    Short of breath when lying flat:    Irregular heart rhythm:        Vascular    Pain in calf, thigh, or hip brought on by ambulation:    Pain in feet at night that wakes you up from your sleep:     Blood clot in your veins:    Leg swelling:         Pulmonary    Oxygen at home:    Productive cough:     Wheezing:         Neurologic    Sudden weakness in arms or legs:     Sudden numbness in arms or legs:     Sudden onset of difficulty speaking or slurred speech:    Temporary loss of vision in one eye:     Problems with dizziness:         Gastrointestinal    Blood in stool:     Vomited blood:         Genitourinary    Burning when urinating:     Blood in urine:        Psychiatric    Major depression:         Hematologic    Bleeding problems:    Problems with blood clotting too easily:        Skin    Rashes or ulcers:        Constitutional    Fever or chills:     PHYSICAL EXAM:   Vitals:   04/28/22 1322  BP: 127/84  Pulse: 89  Resp: 18  Temp: 97.7 F (36.5 C)  SpO2: 96%  Weight: 108 lb 8 oz (49.2 kg)  Height: 5' (1.524 m)  GENERAL: The patient is a well-nourished female, in no acute distress. The vital signs are documented above. CARDIAC: There is a regular rate and rhythm.  VASCULAR:  She has a good thrill in her fistula which is slightly pulsatile. There is a superficial ulceration over an aneurysmal segment in the mid upper arm.   PULMONARY: There is good air exchange bilaterally without wheezing or rales. MUSCULOSKELETAL: There are no major deformities or cyanosis. NEUROLOGIC: No focal weakness or paresthesias are detected. SKIN: There are no ulcers or rashes noted. PSYCHIATRIC: The patient has a normal affect.  DATA:    No new data  [name] [facility] [phone]

## 2022-05-03 ENCOUNTER — Other Ambulatory Visit: Payer: Self-pay

## 2022-05-03 DIAGNOSIS — Z992 Dependence on renal dialysis: Secondary | ICD-10-CM

## 2022-05-09 NOTE — Progress Notes (Signed)
Unable to reach pt by phone - left message earlier today and tried several times and called her daughter Barnett Applebaum and left a message, no return call from.  I left detailed pre-op instructions on pt's voicemail.

## 2022-05-10 ENCOUNTER — Ambulatory Visit (HOSPITAL_COMMUNITY)
Admission: RE | Admit: 2022-05-10 | Discharge: 2022-05-10 | Disposition: A | Discharge status: Home / Self Care | Payer: Medicare Other | Attending: Vascular Surgery | Admitting: Vascular Surgery

## 2022-05-10 ENCOUNTER — Other Ambulatory Visit: Payer: Self-pay

## 2022-05-10 ENCOUNTER — Ambulatory Visit (HOSPITAL_COMMUNITY): Payer: Medicare Other | Admitting: Anesthesiology

## 2022-05-10 ENCOUNTER — Encounter (HOSPITAL_COMMUNITY)
Admission: RE | Disposition: A | Discharge status: Home / Self Care | Payer: Self-pay | Source: Home / Self Care | Attending: Vascular Surgery

## 2022-05-10 ENCOUNTER — Ambulatory Visit (HOSPITAL_BASED_OUTPATIENT_CLINIC_OR_DEPARTMENT_OTHER): Payer: Medicare Other | Admitting: Anesthesiology

## 2022-05-10 ENCOUNTER — Encounter (HOSPITAL_COMMUNITY): Payer: Self-pay | Admitting: Vascular Surgery

## 2022-05-10 DIAGNOSIS — I071 Rheumatic tricuspid insufficiency: Secondary | ICD-10-CM | POA: Diagnosis not present

## 2022-05-10 DIAGNOSIS — E1122 Type 2 diabetes mellitus with diabetic chronic kidney disease: Secondary | ICD-10-CM

## 2022-05-10 DIAGNOSIS — N186 End stage renal disease: Secondary | ICD-10-CM | POA: Diagnosis not present

## 2022-05-10 DIAGNOSIS — L98499 Non-pressure chronic ulcer of skin of other sites with unspecified severity: Secondary | ICD-10-CM | POA: Diagnosis present

## 2022-05-10 DIAGNOSIS — F32A Depression, unspecified: Secondary | ICD-10-CM | POA: Diagnosis not present

## 2022-05-10 DIAGNOSIS — K754 Autoimmune hepatitis: Secondary | ICD-10-CM | POA: Diagnosis not present

## 2022-05-10 DIAGNOSIS — I120 Hypertensive chronic kidney disease with stage 5 chronic kidney disease or end stage renal disease: Secondary | ICD-10-CM

## 2022-05-10 DIAGNOSIS — Z992 Dependence on renal dialysis: Secondary | ICD-10-CM

## 2022-05-10 DIAGNOSIS — T82898A Other specified complication of vascular prosthetic devices, implants and grafts, initial encounter: Secondary | ICD-10-CM | POA: Diagnosis not present

## 2022-05-10 DIAGNOSIS — K219 Gastro-esophageal reflux disease without esophagitis: Secondary | ICD-10-CM | POA: Insufficient documentation

## 2022-05-10 DIAGNOSIS — Z79899 Other long term (current) drug therapy: Secondary | ICD-10-CM | POA: Insufficient documentation

## 2022-05-10 DIAGNOSIS — Z944 Liver transplant status: Secondary | ICD-10-CM | POA: Insufficient documentation

## 2022-05-10 DIAGNOSIS — N185 Chronic kidney disease, stage 5: Secondary | ICD-10-CM | POA: Diagnosis not present

## 2022-05-10 DIAGNOSIS — F419 Anxiety disorder, unspecified: Secondary | ICD-10-CM | POA: Insufficient documentation

## 2022-05-10 HISTORY — PX: REVISON OF ARTERIOVENOUS FISTULA: SHX6074

## 2022-05-10 LAB — POCT I-STAT, CHEM 8
BUN: 34 mg/dL — ABNORMAL HIGH (ref 8–23)
Calcium, Ion: 1.06 mmol/L — ABNORMAL LOW (ref 1.15–1.40)
Chloride: 97 mmol/L — ABNORMAL LOW (ref 98–111)
Creatinine, Ser: 4.8 mg/dL — ABNORMAL HIGH (ref 0.44–1.00)
Glucose, Bld: 77 mg/dL (ref 70–99)
HCT: 34 % — ABNORMAL LOW (ref 36.0–46.0)
Hemoglobin: 11.6 g/dL — ABNORMAL LOW (ref 12.0–15.0)
Potassium: 5.6 mmol/L — ABNORMAL HIGH (ref 3.5–5.1)
Sodium: 135 mmol/L (ref 135–145)
TCO2: 32 mmol/L (ref 22–32)

## 2022-05-10 SURGERY — REVISON OF ARTERIOVENOUS FISTULA
Anesthesia: General | Site: Arm Upper | Laterality: Left

## 2022-05-10 MED ORDER — PROPOFOL 10 MG/ML IV BOLUS
INTRAVENOUS | Status: AC
  Filled 2022-05-10: qty 20

## 2022-05-10 MED ORDER — LEVETIRACETAM 500 MG PO TABS
500.0000 mg | ORAL_TABLET | Freq: Once | ORAL | Status: AC
  Administered 2022-05-10: 500 mg via ORAL
  Filled 2022-05-10: qty 1

## 2022-05-10 MED ORDER — OXYCODONE-ACETAMINOPHEN 5-325 MG PO TABS: 1.0000 | ORAL_TABLET | ORAL | 0 refills | Status: AC | PRN

## 2022-05-10 MED ORDER — LIDOCAINE HCL (PF) 1 % IJ SOLN
INTRAMUSCULAR | Status: AC
  Filled 2022-05-10: qty 30

## 2022-05-10 MED ORDER — LIDOCAINE 2% (20 MG/ML) 5 ML SYRINGE
INTRAMUSCULAR | Status: DC | PRN
  Administered 2022-05-10: 40 mg via INTRAVENOUS

## 2022-05-10 MED ORDER — ONDANSETRON HCL 4 MG/2ML IJ SOLN
INTRAMUSCULAR | Status: DC | PRN
  Administered 2022-05-10: 4 mg via INTRAVENOUS

## 2022-05-10 MED ORDER — FENTANYL CITRATE (PF) 100 MCG/2ML IJ SOLN: 25.0000 ug | INTRAMUSCULAR | Status: DC | PRN

## 2022-05-10 MED ORDER — LIDOCAINE-EPINEPHRINE 1 %-1:100000 IJ SOLN
INTRAMUSCULAR | Status: DC | PRN
  Administered 2022-05-10: 22 mL

## 2022-05-10 MED ORDER — DEXAMETHASONE SODIUM PHOSPHATE 10 MG/ML IJ SOLN
INTRAMUSCULAR | Status: AC
  Filled 2022-05-10: qty 1

## 2022-05-10 MED ORDER — LACTATED RINGERS IV SOLN: INTRAVENOUS | Status: DC

## 2022-05-10 MED ORDER — PAPAVERINE HCL 30 MG/ML IJ SOLN
INTRAMUSCULAR | Status: AC
  Filled 2022-05-10: qty 2

## 2022-05-10 MED ORDER — ACETAMINOPHEN 10 MG/ML IV SOLN: 1000.0000 mg | Freq: Once | INTRAVENOUS | Status: DC | PRN

## 2022-05-10 MED ORDER — DEXAMETHASONE SODIUM PHOSPHATE 10 MG/ML IJ SOLN
INTRAMUSCULAR | Status: DC | PRN
  Administered 2022-05-10: 10 mg via INTRAVENOUS

## 2022-05-10 MED ORDER — PROPOFOL 10 MG/ML IV BOLUS
INTRAVENOUS | Status: DC | PRN
  Administered 2022-05-10: 100 mg via INTRAVENOUS

## 2022-05-10 MED ORDER — CEFAZOLIN SODIUM-DEXTROSE 2-4 GM/100ML-% IV SOLN
2.0000 g | INTRAVENOUS | Status: AC
  Administered 2022-05-10: 2 g via INTRAVENOUS
  Filled 2022-05-10: qty 100

## 2022-05-10 MED ORDER — HEPARIN 6000 UNIT IRRIGATION SOLUTION
Status: AC
  Filled 2022-05-10: qty 500

## 2022-05-10 MED ORDER — OXYCODONE HCL 5 MG/5ML PO SOLN: 5.0000 mg | Freq: Once | ORAL | Status: DC | PRN

## 2022-05-10 MED ORDER — FENTANYL CITRATE (PF) 250 MCG/5ML IJ SOLN
INTRAMUSCULAR | Status: DC | PRN
  Administered 2022-05-10: 50 ug via INTRAVENOUS

## 2022-05-10 MED ORDER — PROMETHAZINE HCL 25 MG/ML IJ SOLN: 6.2500 mg | INTRAMUSCULAR | Status: DC | PRN

## 2022-05-10 MED ORDER — ORAL CARE MOUTH RINSE: 15.0000 mL | Freq: Once | OROMUCOSAL | Status: AC

## 2022-05-10 MED ORDER — METOPROLOL SUCCINATE ER 25 MG PO TB24
12.5000 mg | ORAL_TABLET | Freq: Once | ORAL | Status: AC
  Administered 2022-05-10: 12.5 mg via ORAL
  Filled 2022-05-10: qty 1

## 2022-05-10 MED ORDER — ACETAMINOPHEN 160 MG/5ML PO SOLN: 325.0000 mg | ORAL | Status: DC | PRN

## 2022-05-10 MED ORDER — CHLORHEXIDINE GLUCONATE 4 % EX LIQD: 60.0000 mL | Freq: Once | CUTANEOUS | Status: DC

## 2022-05-10 MED ORDER — SODIUM CHLORIDE 0.9 % IV SOLN: INTRAVENOUS | Status: DC

## 2022-05-10 MED ORDER — CHLORHEXIDINE GLUCONATE 0.12 % MT SOLN
15.0000 mL | Freq: Once | OROMUCOSAL | Status: AC
  Administered 2022-05-10: 15 mL via OROMUCOSAL
  Filled 2022-05-10: qty 15

## 2022-05-10 MED ORDER — LIDOCAINE 2% (20 MG/ML) 5 ML SYRINGE
INTRAMUSCULAR | Status: AC
  Filled 2022-05-10: qty 5

## 2022-05-10 MED ORDER — LIDOCAINE-EPINEPHRINE (PF) 1 %-1:200000 IJ SOLN
INTRAMUSCULAR | Status: AC
  Filled 2022-05-10: qty 30

## 2022-05-10 MED ORDER — OXYCODONE HCL 5 MG PO TABS: 5.0000 mg | ORAL_TABLET | Freq: Once | ORAL | Status: DC | PRN

## 2022-05-10 MED ORDER — ACETAMINOPHEN 325 MG PO TABS: 325.0000 mg | ORAL_TABLET | ORAL | Status: DC | PRN

## 2022-05-10 MED ORDER — STERILE WATER FOR IRRIGATION IR SOLN
Status: DC | PRN
  Administered 2022-05-10: 1000 mL

## 2022-05-10 MED ORDER — ONDANSETRON HCL 4 MG/2ML IJ SOLN
INTRAMUSCULAR | Status: AC
  Filled 2022-05-10: qty 2

## 2022-05-10 MED ORDER — 0.9 % SODIUM CHLORIDE (POUR BTL) OPTIME
TOPICAL | Status: DC | PRN
  Administered 2022-05-10: 1000 mL

## 2022-05-10 MED ORDER — PROPOFOL 500 MG/50ML IV EMUL
INTRAVENOUS | Status: DC | PRN
  Administered 2022-05-10: 100 ug/kg/min via INTRAVENOUS

## 2022-05-10 MED ORDER — PHENYLEPHRINE HCL-NACL 20-0.9 MG/250ML-% IV SOLN
INTRAVENOUS | Status: DC | PRN
  Administered 2022-05-10: 25 ug/min via INTRAVENOUS

## 2022-05-10 MED ORDER — FENTANYL CITRATE (PF) 250 MCG/5ML IJ SOLN
INTRAMUSCULAR | Status: AC
  Filled 2022-05-10: qty 5

## 2022-05-10 MED ORDER — AMISULPRIDE (ANTIEMETIC) 5 MG/2ML IV SOLN
INTRAVENOUS | Status: AC
  Filled 2022-05-10: qty 4

## 2022-05-10 MED ORDER — AMISULPRIDE (ANTIEMETIC) 5 MG/2ML IV SOLN
10.0000 mg | Freq: Once | INTRAVENOUS | Status: AC | PRN
  Administered 2022-05-10: 10 mg via INTRAVENOUS

## 2022-05-10 MED ORDER — HEPARIN 6000 UNIT IRRIGATION SOLUTION
Status: DC | PRN
  Administered 2022-05-10: 1

## 2022-05-10 SURGICAL SUPPLY — 32 items
ARMBAND PINK RESTRICT EXTREMIT (MISCELLANEOUS) ×2 IMPLANT
BAG COUNTER SPONGE SURGICOUNT (BAG) ×2 IMPLANT
CANISTER SUCT 3000ML PPV (MISCELLANEOUS) ×2 IMPLANT
CANNULA VESSEL 3MM 2 BLNT TIP (CANNULA) ×2 IMPLANT
CLIP VESOCCLUDE MED 6/CT (CLIP) ×2 IMPLANT
CLIP VESOCCLUDE SM WIDE 6/CT (CLIP) ×2 IMPLANT
COVER PROBE W GEL 5X96 (DRAPES) ×1 IMPLANT
DERMABOND ADVANCED (GAUZE/BANDAGES/DRESSINGS) ×1
DERMABOND ADVANCED .7 DNX12 (GAUZE/BANDAGES/DRESSINGS) ×1 IMPLANT
ELECT REM PT RETURN 9FT ADLT (ELECTROSURGICAL) ×2
ELECTRODE REM PT RTRN 9FT ADLT (ELECTROSURGICAL) ×1 IMPLANT
GLOVE BIOGEL PI IND STRL 6.5 (GLOVE) IMPLANT
GLOVE BIOGEL PI IND STRL 8 (GLOVE) ×1 IMPLANT
GLOVE BIOGEL PI INDICATOR 6.5 (GLOVE) ×3
GLOVE BIOGEL PI INDICATOR 8 (GLOVE) ×1
GLOVE SURG POLY ORTHO LF SZ7.5 (GLOVE) IMPLANT
GLOVE SURG SS PI 6.5 STRL IVOR (GLOVE) ×1 IMPLANT
GLOVE SURG SS PI 7.5 STRL IVOR (GLOVE) ×1 IMPLANT
GOWN STRL REUS W/ TWL LRG LVL3 (GOWN DISPOSABLE) ×3 IMPLANT
GOWN STRL REUS W/TWL LRG LVL3 (GOWN DISPOSABLE) ×5
KIT BASIN OR (CUSTOM PROCEDURE TRAY) ×2 IMPLANT
KIT TURNOVER KIT B (KITS) ×2 IMPLANT
NS IRRIG 1000ML POUR BTL (IV SOLUTION) ×2 IMPLANT
PACK CV ACCESS (CUSTOM PROCEDURE TRAY) ×2 IMPLANT
PAD ARMBOARD 7.5X6 YLW CONV (MISCELLANEOUS) ×4 IMPLANT
SUT MNCRL AB 4-0 PS2 18 (SUTURE) ×2 IMPLANT
SUT PROLENE 6 0 BV (SUTURE) ×2 IMPLANT
SUT VIC AB 3-0 SH 27 (SUTURE) ×1
SUT VIC AB 3-0 SH 27X BRD (SUTURE) ×1 IMPLANT
TOWEL GREEN STERILE (TOWEL DISPOSABLE) ×2 IMPLANT
UNDERPAD 30X36 HEAVY ABSORB (UNDERPADS AND DIAPERS) ×2 IMPLANT
WATER STERILE IRR 1000ML POUR (IV SOLUTION) ×2 IMPLANT

## 2022-05-10 NOTE — Transfer of Care (Signed)
Immediate Anesthesia Transfer of Care Note  Patient: Tami [name]  Procedure(s) Performed: EXISION OF ULCER OVERLYING  ARTERIOVENOUS FISTULA (Left: Arm Upper)  Patient Location: PACU  Anesthesia Type:General  Level of Consciousness: awake, alert  and oriented  Airway & Oxygen Therapy: Patient Spontanous Breathing  Post-op Assessment: Report given to RN, Post -op Vital signs reviewed and stable and Patient moving all extremities X 4  Post vital signs: Reviewed and stable  Last Vitals:  Vitals Value Taken Time  BP 131/71 05/10/22 1145  Temp    Pulse 73 05/10/22 1146  Resp 15 05/10/22 1146  SpO2 94 % 05/10/22 1146  Vitals shown include unvalidated device data.  Last Pain:  Vitals:   05/10/22 0807  TempSrc: Oral  PainSc: 0-No pain         Complications: No notable events documented.

## 2022-05-10 NOTE — Anesthesia Procedure Notes (Signed)
Procedure Name: Intubation Date/Time: 05/10/2022 10:50 AM  Performed by: Rande Brunt, CRNAPre-anesthesia Checklist: Patient identified, Emergency Drugs available, Suction available and Patient being monitored Patient Re-evaluated:Patient Re-evaluated prior to induction Oxygen Delivery Method: Circle System Utilized Preoxygenation: Pre-oxygenation with 100% oxygen Induction Type: IV induction and Inhalational induction Ventilation: Mask ventilation without difficulty Laryngoscope Size: Miller and 2 Grade View: Grade I Tube type: Oral Tube size: 7.5 mm Number of attempts: 1 Airway Equipment and Method: Stylet and Oral airway Placement Confirmation: ETT inserted through vocal cords under direct vision, positive ETCO2 and breath sounds checked- equal and bilateral Secured at: 21 cm Tube secured with: Tape Dental Injury: Teeth and Oropharynx as per pre-operative assessment

## 2022-05-10 NOTE — Op Note (Signed)
    NAME: Tami [name]    MRN: [id number] DOB: [birth year]    DATE OF OPERATION: 05/10/2022  PREOP DIAGNOSIS:    Ulcer overlying left AV fistula  POSTOP DIAGNOSIS:    Same  PROCEDURE:    Excision of ulcer overlying left AV fistula  SURGEON: Judeth Cornfield. Scot Dock, MD  ASSIST: Luisa Dago, PA  ANESTHESIA: General  EBL: Minimal  INDICATIONS:    TIAJA HAGAN is a 66 y.o. female who presented with some ulceration overlying her left upper arm fistula.  I recommended excision of this area given the risk of bleeding.  FINDINGS:   The fistula had an excellent thrill.  The involved tissue was excised and the wound closed over the fistula.  TECHNIQUE:   The patient was taken to the operating room and received a general anesthetic.  The left arm was prepped and draped in usual sterile fashion.  I marked the ellipse of skin overlying the fistula which had some thinned out areas and eschar.  I infiltrated with 1% lidocaine with epinephrine.  This eschar was excised overlying the fistula and the fistula was intact.  I then undermined the tissue on both sides allowing me to close the wound in 2 layers.  Hemostasis was obtained the wound.  The wound was closed with a deep layer of 3-0 Vicryl and the skin closed with 4-0 Monocryl.  Dermabond was applied.  The patient tolerated the procedure well was transferred recovery room in stable condition.  All needle and sponge counts were correct.  Given the complexity of the case a first assistant was necessary in order to expedient the procedure and safely perform the technical aspects of the operation.  [name], MD, FACS [facility]  DATE OF DICTATION:   05/10/2022

## 2022-05-10 NOTE — Anesthesia Preprocedure Evaluation (Signed)
Anesthesia Evaluation  Patient identified by MRN, date of birth, ID band Patient awake    Reviewed: Allergy & Precautions, NPO status , Patient's Chart, lab work & pertinent test results  Airway Mallampati: I  TM Distance: >3 FB Neck ROM: Full    Dental  (+) Edentulous Upper, Edentulous Lower   Pulmonary    breath sounds clear to auscultation       Cardiovascular hypertension, Pt. on medications and Pt. on home beta blockers  Rhythm:Regular Rate:Normal  Echo: - Left ventricle: The cavity size was normal. Wall thickness was  increased in a pattern of mild LVH. Systolic function was normal.  The estimated ejection fraction was in the range of 60% to 65%.  Wall motion was normal; there were no regional wall motion  abnormalities. Doppler parameters are consistent with abnormal  left ventricular relaxation (grade 1 diastolic dysfunction). The  E&'e&' ratio is >15, suggesting elevated LV filling pressure.  - Left atrium: The atrium was at the upper limits of normal in  size.  - Tricuspid valve: There was mild regurgitation.  - Pulmonary arteries: PA peak pressure: 43 mm Hg (S).  - Inferior vena cava: The vessel was normal in size. The  respirophasic diameter changes were in the normal range (>= 50%),  consistent with normal central venous pressure.    Neuro/Psych Seizures -, Well Controlled,  PSYCHIATRIC DISORDERS Anxiety Depression    GI/Hepatic GERD  Medicated,(+) Hepatitis -, Autoimmune- s/p Liver Transplant   Endo/Other  diabetes  Renal/GU ESRF and DialysisRenal disease     Musculoskeletal   Abdominal Normal abdominal exam  (+)   Peds  Hematology   Anesthesia Other Findings   Reproductive/Obstetrics                             Anesthesia Physical Anesthesia Plan  ASA: 3  Anesthesia Plan: General   Post-op Pain Management:    Induction: Intravenous  PONV Risk  Score and Plan: 4 or greater and Ondansetron, Midazolam and Treatment may vary due to age or medical condition  Airway Management Planned: LMA  Additional Equipment: None  Intra-op Plan:   Post-operative Plan: Extubation in OR  Informed Consent: I have reviewed the patients History and Physical, chart, labs and discussed the procedure including the risks, benefits and alternatives for the proposed anesthesia with the patient or authorized representative who has indicated his/her understanding and acceptance.       Plan Discussed with: CRNA  Anesthesia Plan Comments:         Anesthesia Quick Evaluation

## 2022-05-10 NOTE — Anesthesia Postprocedure Evaluation (Signed)
Anesthesia Post Note  Patient: KENNI NEWTON  Procedure(s) Performed: EXISION OF ULCER OVERLYING  ARTERIOVENOUS FISTULA (Left: Arm Upper)     Patient location during evaluation: PACU Anesthesia Type: General Level of consciousness: awake and alert Pain management: pain level controlled Vital Signs Assessment: post-procedure vital signs reviewed and stable Respiratory status: spontaneous breathing, nonlabored ventilation, respiratory function stable and patient connected to nasal cannula oxygen Cardiovascular status: blood pressure returned to baseline and stable Postop Assessment: no apparent nausea or vomiting Anesthetic complications: no   No notable events documented.  Last Vitals:  Vitals:   05/10/22 1200 05/10/22 1215  BP: (!) 141/79 (!) 154/74  Pulse: 69 69  Resp: 14 14  Temp:  (!) 36.4 C  SpO2: 95% 96%    Last Pain:  Vitals:   05/10/22 1145  TempSrc:   PainSc: 0-No pain                 Effie Berkshire

## 2022-05-10 NOTE — Interval H&P Note (Signed)
History and Physical Interval Note:  05/10/2022 10:29 AM  Tami [name]  has presented today for surgery, with the diagnosis of End Stage Renal Disease.  The various methods of treatment have been discussed with the patient and family. After consideration of risks, benefits and other options for treatment, the patient has consented to  Procedure(s): REVISON OF ARTERIOVENOUS FISTULA (Left) as a surgical intervention.  The patient's history has been reviewed, patient examined, no change in status, stable for surgery.  I have reviewed the patient's chart and labs.  Questions were answered to the patient's satisfaction.     [name]

## 2022-05-11 ENCOUNTER — Encounter (HOSPITAL_COMMUNITY): Payer: Self-pay | Admitting: Vascular Surgery

## 2022-05-12 ENCOUNTER — Ambulatory Visit: Admit: 2022-05-12 | Discharge: 2022-05-13 | Payer: MEDICARE

## 2022-05-12 ENCOUNTER — Telehealth: Payer: Self-pay

## 2022-05-12 DIAGNOSIS — Z01810 Encounter for preprocedural cardiovascular examination: Principal | ICD-10-CM

## 2022-05-12 DIAGNOSIS — Z114 Encounter for screening for human immunodeficiency virus [HIV]: Principal | ICD-10-CM

## 2022-05-12 DIAGNOSIS — N186 End stage renal disease: Principal | ICD-10-CM

## 2022-05-12 DIAGNOSIS — R93429 Abnormal radiologic findings on diagnostic imaging of unspecified kidney: Principal | ICD-10-CM

## 2022-05-12 DIAGNOSIS — Z7289 Other problems related to lifestyle: Principal | ICD-10-CM

## 2022-05-12 DIAGNOSIS — R7989 Other specified abnormal findings of blood chemistry: Principal | ICD-10-CM

## 2022-05-12 DIAGNOSIS — Z01818 Encounter for other preprocedural examination: Principal | ICD-10-CM

## 2022-05-12 DIAGNOSIS — Z992 Dependence on renal dialysis: Principal | ICD-10-CM

## 2022-05-12 DIAGNOSIS — R944 Abnormal results of kidney function studies: Principal | ICD-10-CM

## 2022-05-12 DIAGNOSIS — Z0000 Encounter for general adult medical examination without abnormal findings: Principal | ICD-10-CM

## 2022-05-12 DIAGNOSIS — N184 Chronic kidney disease, stage 4 (severe): Principal | ICD-10-CM

## 2022-05-12 DIAGNOSIS — N289 Disorder of kidney and ureter, unspecified: Principal | ICD-10-CM

## 2022-05-12 LAB — LIPID PANEL
CHOLESTEROL/HDL RATIO SCREEN: 5.1 — ABNORMAL HIGH (ref 1.0–4.5)
CHOLESTEROL: 148 mg/dL (ref <=200)
HDL CHOLESTEROL: 29 mg/dL — ABNORMAL LOW (ref 40–60)
LDL CHOLESTEROL CALCULATED: 93 mg/dL (ref 40–99)
NON-HDL CHOLESTEROL: 119 mg/dL (ref 70–130)
TRIGLYCERIDES: 131 mg/dL (ref 0–150)
VLDL CHOLESTEROL CAL: 26.2 mg/dL (ref 11–41)

## 2022-05-12 LAB — PROTIME-INR
INR: 1.05
PROTIME: 11.9 s (ref 9.8–12.8)

## 2022-05-12 LAB — APTT
APTT: 28 s (ref 25.1–36.5)
HEPARIN CORRELATION: 0.2

## 2022-05-12 LAB — CBC W/ AUTO DIFF
BASOPHILS ABSOLUTE COUNT: 0 10*9/L (ref 0.0–0.1)
BASOPHILS RELATIVE PERCENT: 0.7 %
EOSINOPHILS ABSOLUTE COUNT: 0.1 10*9/L (ref 0.0–0.5)
EOSINOPHILS RELATIVE PERCENT: 2.6 %
HEMATOCRIT: 31.2 % — ABNORMAL LOW (ref 34.0–44.0)
HEMOGLOBIN: 10.2 g/dL — ABNORMAL LOW (ref 11.3–14.9)
LYMPHOCYTES ABSOLUTE COUNT: 1.4 10*9/L (ref 1.1–3.6)
LYMPHOCYTES RELATIVE PERCENT: 31.8 %
MEAN CORPUSCULAR HEMOGLOBIN CONC: 32.6 g/dL (ref 32.0–36.0)
MEAN CORPUSCULAR HEMOGLOBIN: 32.3 pg (ref 25.9–32.4)
MEAN CORPUSCULAR VOLUME: 99.2 fL — ABNORMAL HIGH (ref 77.6–95.7)
MEAN PLATELET VOLUME: 7.9 fL (ref 6.8–10.7)
MONOCYTES ABSOLUTE COUNT: 0.5 10*9/L (ref 0.3–0.8)
MONOCYTES RELATIVE PERCENT: 11 %
NEUTROPHILS ABSOLUTE COUNT: 2.4 10*9/L (ref 1.8–7.8)
NEUTROPHILS RELATIVE PERCENT: 53.9 %
PLATELET COUNT: 194 10*9/L (ref 150–450)
RED BLOOD CELL COUNT: 3.15 10*12/L — ABNORMAL LOW (ref 3.95–5.13)
RED CELL DISTRIBUTION WIDTH: 14.7 % (ref 12.2–15.2)
WBC ADJUSTED: 4.5 10*9/L (ref 3.6–11.2)

## 2022-05-12 LAB — GAMMA GT: GAMMA GLUTAMYL TRANSFERASE: 23 U/L

## 2022-05-12 LAB — HEPATIC FUNCTION PANEL
ALBUMIN: 3.3 g/dL — ABNORMAL LOW (ref 3.4–5.0)
ALKALINE PHOSPHATASE: 57 U/L (ref 46–116)
ALT (SGPT): <7 U/L — ABNORMAL LOW (ref 10–49)
AST (SGOT): 11 U/L (ref <=34)
BILIRUBIN DIRECT: <0.1 mg/dL (ref 0.00–0.30)
BILIRUBIN TOTAL: 0.2 mg/dL — ABNORMAL LOW (ref 0.3–1.2)
PROTEIN TOTAL: 8.2 g/dL (ref 5.7–8.2)

## 2022-05-12 LAB — CREATININE
CREATININE: 5.06 mg/dL — ABNORMAL HIGH
EGFR CKD-EPI (2021) FEMALE: 9 mL/min/{1.73_m2} — ABNORMAL LOW (ref >=60)

## 2022-05-12 LAB — LACTATE DEHYDROGENASE: LACTATE DEHYDROGENASE: 122 U/L (ref 120–246)

## 2022-05-12 LAB — BUN: BLOOD UREA NITROGEN: 25 mg/dL — ABNORMAL HIGH (ref 9–23)

## 2022-05-12 LAB — HEMOGLOBIN A1C: HEMOGLOBIN A1C: <3.8 % — ABNORMAL LOW (ref 4.8–5.6)

## 2022-05-12 LAB — URIC ACID: URIC ACID: 4.7 mg/dL

## 2022-05-12 NOTE — Unmapped (Signed)
Post Liver Transplant Follow-Up    Transplant Date: 05/15/1995 (Liver)    Called pt to prep for post liver txp follow up scheduled with NP Metheny today at [facility]. Pt had fistula revision on Tuesday, continues with iHD MWF, currently in kidney eval with [facility]. Pt has an established PCP at [facility] for routine needs. Labs obtained at [facility] dialysis center, generic CsA refilled via [facility] pharmacy. Medication list reconciled and reviewed. Vaccines & screenings up to date. Pt denied any EtOH, tobacco, or recreational drug use. Provided patient education reviewing the following: preventative care, vaccinations, nutrition, over the counter medications, and food/herbal products to avoid. Pt verbalized understanding of all topics discussed.

## 2022-05-12 NOTE — Unmapped (Signed)
FOLLOW UP ANNUAL LIVER CLINIC NOTE     Patient Name: Katherine Norton  Medical Record Number: [id number]  Date of Service: 05/12/2022    Referring Physician: Info Unavailable Referr*   Current complaint: Follow up Annual Liver    Assessment/Plan:     Seryn Bartie is a 66 y.o. 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 Recent LFT testing/images WNL.  Immunosuppressive monotherapy goal (40-80). Continue neoral 50mg  BID, repeat trough, presumed not a true trough.    Hoping to be find a donor kidney transplant.    Today we discussed nutrition at length as a means of preventing frailty.         HEALTH MAINTENANCE:   Immunization History   Administered Date(s) Administered    COVID-19 VAC,BIVALENT,MODERNA(BLUE CAP) 07/03/2021    COVID-19 VACCINE,MRNA(MODERNA)(PF) 12/24/2019, 01/21/2020    HEPATITIS B VACCINE ADULT, ADJUVANTED, IM(HEPLISAV B) 12/11/2020, 01/06/2021, 02/12/2021, 04/09/2021    HEPATITIS B VACCINE ADULT,IM(ENERGIX B, RECOMBIVAX) 08/24/2018, 09/24/2018, 10/26/2018, 02/22/2019, 05/22/2019, 06/21/2019, 07/19/2019, 11/22/2019    INFLUENZA QUAD HIGH DOSE 73YRS+(FLUZONE) 08/21/2020    Influenza Vaccine Quad (IIV4 PF) 25mo+ injectable 07/06/2017, 07/26/2019    Influenza Virus Vaccine, unspecified formulation 07/01/2015, 08/22/2016, 07/06/2017, 07/26/2019, 08/07/2021    PNEUMOCOCCAL POLYSACCHARIDE 23 08/22/2018, 09/03/2018    Pneumococcal Conjugate 13-Valent 07/06/2017    SHINGRIX-ZOSTER VACCINE (HZV), RECOMBINANT,SUB-UNIT,ADJUVANTED IM 01/15/2018, 05/14/2020   Shingrix vaccine series completed    Return to clinic: 1 year  Labs: Q 6-8 weeks    I personally spent 35 minutes face-to-face and non-face-to-face in the care of this patient, which includes all pre, intra, and post visit time on the date of service. Greater than 50% of the time was spent on counseling and the substance of the discussion.      Subjective:     HPI: Katherine Norton is a 66 y.o. 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 She returns today for her annual follow up. 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 She currently dialyizes via L upper arm AVF and doesn't make urine. She is tolerating HD well and has few changes to health over the last year but reports ongoing weight loss.    Denies fever, chills, arthralgias, and fatigue. Denies chest pain, SOB, N/V/D, constipation or abdominal pain. No acute complaint today.    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    PR TRACHEOSTOMY, PLANNED Midline 06/26/2018    Procedure: TRACHEOSTOMY PLANNED (SEPART PROC);  Surgeon: Theodosia Blender, MD;  Location: MAIN OR [facility];  Service: ENT    TONSILLECTOMY         Family History   Problem Relation Age of Onset    Hypertension Mother     Hypertension Father     Prostate cancer Father     Lupus Sister     Lupus Brother     Kidney disease Neg Hx        Social History  Socioeconomic History    Marital status: Single   Tobacco Use    Smoking status: Never     Passive exposure: Never    Smokeless tobacco: Never   Substance and Sexual Activity    Alcohol use: No     Alcohol/week: 0.0 standard drinks    Drug use: No       REVIEW OF SYSTEMS:   The balance of 10/12 systems is negative with the exception of HPI.    Objective:     MEDICATIONS:  Allergies   Allergen Reactions    Iodinated Contrast Media     Ioxaglate Sodium     Latex     Succinylcholine Chloride      Other reaction(s): Other (See Comments)       Current Outpatient Medications   Medication Sig Dispense Refill acetaminophen (TYLENOL) 325 MG tablet 2 tablets (650 mg total) by G-tube route every six (6) hours as needed. (Patient taking differently: Take 2 tablets (650 mg total) by mouth every six (6) hours as needed (headaches).)  0    allopurinoL (ZYLOPRIM) 100 MG tablet Take 1 tablet (100 mg total) by mouth daily as needed.      amLODIPine (NORVASC) 5 MG tablet Take 1 tablet (5 mg total) by mouth in the morning.      calcitonin, salmon, (MIACALCIN) 200 unit/actuation nasal spray 1 spray into alternating nostrils in the morning.      celecoxib (CELEBREX) 100 MG capsule TAKE 1 (ONE) CAPSULE BY MOUTH TWICE A DAY, MAX DAILY DOSE  2 CAPSULE      cetirizine (ZYRTEC) 5 MG tablet TAKE 1 (ONE) TABLET BY MOUTH DAILY, MAX DAILY DOSE: 1 TABLET      cycloSPORINE modified (NEORAL) 25 MG capsule Take 2 capsules (50 mg total) by mouth Two (2) times a day. 360 capsule 3    dextrose (D10W) 10% Soln bolus Infuse 125 mL into a venous catheter every thirty (30) minutes as needed.  0    dextrose 5 % and sodium chloride 0.9 % (D5-NS) infu Infuse 25 mL/hr into a venous catheter continuous as needed (if tube feed held or NPO).  0    fluticasone propionate (FLONASE) 50 mcg/actuation nasal spray 1 spray into each nostril.      food supplemt, lactose-reduced (ENSURE) Liqd Take 120 mL by mouth.      gabapentin (NEURONTIN) 100 MG capsule Take 1 capsule (100 mg total) by mouth daily as needed.      gentamicin 1 mg/mL, sodium citrate 4% Gent locks with dialysis 2.4 mL 4    heparin sodium,porcine (HEPARIN, PORCINE,) 1,000 unit/mL 1000 unit/mL injection This heparin is for dialysis use ONLY. RN if you are unable to aspirate from both lumes, call provider piror to using the catheter. 1 mL 0    heparin sodium,porcine (HEPARIN, PORCINE,) 5,000 unit/mL injection Inject 1 mL (5,000 Units total) under the skin every eight (8) hours. 1 mL 0    levETIRAcetam (KEPPRA) 500 mg/100 mL PgBk 1 tablet.      lipase/protease/amylase (PANCRELIPASE 16,000 ORAL) Pancrelipase 24000-76000      loratadine (CLARITIN) 10 mg tablet Take 1 tablet (10 mg total) by mouth daily as needed.      melatonin 3 mg Tab Take 1 tablet (3 mg total) by mouth nightly.  0    metoprolol succinate (TOPROL-XL) 50 MG 24 hr tablet 1 tablet (50 mg total) daily.      ondansetron (ZOFRAN) 4 MG tablet TAKE 1 TABLET BY MOUTH EVERY  EIGHT HOURS AS NEEDED NAUSEA/VOMITING      vitamin B complx-C-FA-zinc cit (DIALYVITE 800 WITH ZINC 15) 0.8-15 mg Tab Take by mouth.      vitamin E, dl, acetate, 16.1 mg (50 unit)/mL Drop Take 1 capsule by mouth.      albuterol 2.5 mg/0.5 mL nebulizer solution Inhale 0.5 mL (2.5 mg total) by nebulization every six (6) hours as needed for wheezing or shortness of breath. 30 each 12    famotidine (PEPCID) 20 MG tablet 1 tablet (20 mg total) by G-tube route daily. (Patient taking differently: 20 mg  in the morning.) 30 tablet 0    oxyCODONE (ROXICODONE) 5 MG immediate release tablet 1 tablet (5 mg total) by G-tube route every six (6) hours as needed. for up to 5 days (Patient taking differently: Take 5 mg by mouth every six (6) hours as needed.) 10 tablet 0     No current facility-administered medications for this visit.         PHYSICAL EXAM:  BP 108/71  - Pulse 70  - Temp 35.9 ??C (96.6 ??F) (Temporal)  - Ht 152.4 cm (5')  - Wt 49.6 kg (109 lb 6.4 oz) Comment: with shoes - SpO2 100%  - BMI 21.37 kg/m??       Wt Readings from Last 12 Encounters:   05/12/22 49.6 kg (109 lb 6.4 oz)   12/09/21 50 kg (110 lb 4.8 oz)   05/20/21 53.9 kg (118 lb 12.8 oz)   04/13/21 54.7 kg (120 lb 9.6 oz)   01/12/21 55.3 kg (122 lb)   07/16/20 52.2 kg (115 lb)   05/14/20 51.8 kg (114 lb 3.2 oz)   03/19/20 49.8 kg (109 lb 12.8 oz)   05/02/19 57.2 kg (126 lb)   06/26/18 86 kg (189 lb 9.5 oz)   01/15/18 73.2 kg (161 lb 6.4 oz)   12/26/16 72.7 kg (160 lb 4.8 oz)        General Appearance:  NAD, well appearing but thin   HEENT:  [place]/AT. Well hydrated moist mucous membranes of the oral cavity. No scleral icterus. No cervical lymphadenopathy.   Pulmonary:    Normal respiratory effort. CTAB, without wheezes/crackles/rhonchi. Good air movement.    Cardiovascular:  Regular rate and rhythm, no murmur noted.   Extremities No edema. Ecchymosis to LUE with swelling to LUE above AVF   Abdomen:   Normoactive bowel sounds, abdomen soft, non-tender and not distended, no Hepatosplenomegaly or masses. Abdominal scar well healed without hernia.    Musculoskeletal: No joint tenderness, full ROM. Normal gait.    Skin: Skin color, texture, turgor normal, no rashes or lesions.   Neurologic: Grossly intact.   Psychiatric: Judgement and insight appropriate.        LAB RESULTS:  All lab results last 24 hours:    Recent Results (from the past 48 hour(s))   CBC w/ Differential    Collection Time: 05/12/22  2:53 PM   Result Value Ref Range    WBC 4.5 3.6 - 11.2 10*9/L    RBC 3.15 (L) 3.95 - 5.13 10*12/L    HGB 10.2 (L) 11.3 - 14.9 g/dL    HCT 09.6 (L) 04.5 - 44.0 %    MCV 99.2 (H) 77.6 - 95.7 fL    MCH 32.3 25.9 - 32.4 pg    MCHC 32.6 32.0 - 36.0 g/dL    RDW 40.9 81.1 - 91.4 %    MPV 7.9 6.8 - 10.7 fL    Platelet  194 150 - 450 10*9/L    Neutrophils % 53.9 %    Lymphocytes % 31.8 %    Monocytes % 11.0 %    Eosinophils % 2.6 %    Basophils % 0.7 %    Absolute Neutrophils 2.4 1.8 - 7.8 10*9/L    Absolute Lymphocytes 1.4 1.1 - 3.6 10*9/L    Absolute Monocytes 0.5 0.3 - 0.8 10*9/L    Absolute Eosinophils 0.1 0.0 - 0.5 10*9/L    Absolute Basophils 0.0 0.0 - 0.1 10*9/L         IMAGING Personally reviewed   CT Abdomen Pelvis Wo Contrast  Result Date: 05/14/2020  -Extensive aortobiiliac calcifications. Mild calcifications of the distal external iliac vasculature. Scattered calcifications of the common femoral arteries and internal iliac bilaterally.   -Sequelae of liver transplantation. Diffuse pneumobilia.   -Small atrophic native kidneys compatible with chronic medical renal disease. Nonobstructing left nephrolithiasis.     XR Chest 2 views  Result Date: 05/14/2020  No Antigen   Result Value Ref Range    Hep B Surface Ag Negative Nonreactive, See Comment, Negative   CBC   Result Value Ref Range    WBC      RBC      HGB 9.0 (A) g/dL    HCT 16.1 (A) %    MCV      MCH      RDW      MPV      Platelet       *Note: Due to a large number of results and/or encounters for the requested time period, some results have not been displayed. A complete set of results can be found in Results Review.             IMAGING Personally reviewed   CT Abdomen Pelvis Wo Contrast  Result Date: 05/14/2020  -Extensive aortobiiliac calcifications. Mild calcifications of the distal external iliac vasculature. Scattered calcifications of the common femoral arteries and internal iliac bilaterally.   -Sequelae of liver transplantation. Diffuse pneumobilia.   -Small atrophic native kidneys compatible with chronic medical renal disease. Nonobstructing left nephrolithiasis.     XR Chest 2 views  Result Date: 05/14/2020  No acute findings.    US Liver Transplant  Result Date: 05/14/2020  --Patent hepatic transplant vasculature.   --Resistive indices within the common hepatic arteries are stable to slightly increased compared to prior, within normal limits. Recommend attention on follow-up.   --Decreased phasicity of the hepatic veins and IVC, findings may be related to venoocclusive disease however favor technical issues.   --The transplant liver is mildly enlarged and demonstrates increased echogenicity.

## 2022-05-12 NOTE — Telephone Encounter (Signed)
Pt called stating she had surgery on Tuesday and needed a call back.  Reviewed pt's chart, returned pt's call for clarification, two identifiers used. Pt stated that her arm was aching because the HD center stuck her site under the incision yesterday. She said it was so excruciating that she cried out and didn't want to go back to dialysis tomorrow. She said according to the paper, she was supposed to be stuck low and then high. She said they went low first and then right under the incision.  Spoke with Dr Scot Dock who assured me that the HD center should have a copy of the instructions to not stick anywhere near the incision site. Called [facility] who confirmed they had the paper. Informed them of pt's complaints and was told it would be noted in her chart.  Called pt to inform her of the series of events. Told her that Dr Scot Dock recommended placing a temporary bandage over the site and being her own advocate for where she is stuck during HD treatment. Confirmed understanding.

## 2022-05-13 LAB — HIV ANTIGEN/ANTIBODY COMBO: HIV ANTIGEN/ANTIBODY COMBO: NONREACTIVE

## 2022-05-13 LAB — HLA ANTIBODY SCREEN

## 2022-05-13 LAB — HEPATITIS B SURFACE ANTIBODY
HEPATITIS B SURFACE ANTIBODY QUANT: <8 m[IU]/mL (ref <8.00)
HEPATITIS B SURFACE ANTIBODY: NONREACTIVE

## 2022-05-13 LAB — SYPHILIS SCREEN: SYPHILIS RPR SCREEN: NONREACTIVE

## 2022-05-13 LAB — HEPATITIS C ANTIBODY: HEPATITIS C ANTIBODY: NONREACTIVE

## 2022-05-13 LAB — HEPATITIS B SURFACE ANTIGEN: HEPATITIS B SURFACE ANTIGEN: NONREACTIVE

## 2022-05-13 LAB — HEPATITIS B CORE ANTIBODY, TOTAL: HEPATITIS B CORE TOTAL ANTIBODY: NONREACTIVE

## 2022-05-13 LAB — HEPATITIS A IGG: HEPATITIS A IGG: NONREACTIVE

## 2022-05-16 LAB — CMV IGG: CMV IGG: POSITIVE — AB

## 2022-05-16 LAB — HSV ANTIBODIES, IGG
HERPES SIMPLEX VIRUS 1 IGG: POSITIVE — AB
HERPES SIMPLEX VIRUS 2 IGG: POSITIVE — AB
HSV 2 IGG OD: 1.34

## 2022-05-16 LAB — TRANSPLANT IMMUNE STATUS - EBV: EPSTEIN-BARR VCA IGG ANTIBODY: POSITIVE — AB

## 2022-05-16 LAB — RUBELLA ANTIBODY, IGG: RUBELLA IGG SCREEN: POSITIVE

## 2022-05-16 LAB — VARICELLA ZOSTER ANTIBODY, IGG: VARICELLA ZOSTER IGG: POSITIVE

## 2022-05-16 LAB — TOXOPLASMA GONDII ANTIBODY, IGG: TOXOPLASMA GONDII IGG: NEGATIVE

## 2022-05-17 LAB — QUANTIFERON TB GOLD PLUS
QUANTIFERON ANTIGEN 1 MINUS NIL: -0.01 [IU]/mL
QUANTIFERON ANTIGEN 2 MINUS NIL: -0.01 [IU]/mL
QUANTIFERON MITOGEN: 1.52 [IU]/mL
QUANTIFERON TB GOLD PLUS: NEGATIVE
QUANTIFERON TB NIL VALUE: 0.01 [IU]/mL

## 2022-05-17 LAB — TB MITOGEN: TB MITOGEN VALUE: 1.53

## 2022-05-17 LAB — TB NIL: TB NIL VALUE: 0.01

## 2022-05-17 LAB — TB AG1: TB AG1 VALUE: 0

## 2022-05-17 LAB — TB AG2: TB AG2 VALUE: 0

## 2022-05-18 LAB — HLA ANTIBODY SCREEN C2: HLA C2 AB SCR: NEGATIVE

## 2022-05-18 LAB — HLA ANTIBODY SCREEN C1: HLA C1 AB SCR: NEGATIVE

## 2022-06-17 NOTE — Unmapped (Addendum)
[facility] Clinical Assessment & Refill Coordination Note    Katherine Norton, [place]: 03-29-1956  Phone: [phone] (home)     All above HIPAA information was verified with patient.     Was a Nurse, learning disability used for this call? No    Specialty Medication(s):   Transplant: cyclosporine 25mg      Current Outpatient Medications   Medication Sig Dispense Refill   ??? acetaminophen (TYLENOL) 325 MG tablet 2 tablets (650 mg total) by G-tube route every six (6) hours as needed. (Patient taking differently: Take 2 tablets (650 mg total) by mouth every six (6) hours as needed (headaches).)  0   ??? albuterol 2.5 mg/0.5 mL nebulizer solution Inhale 0.5 mL (2.5 mg total) by nebulization every six (6) hours as needed for wheezing or shortness of breath. 30 each 12   ??? allopurinoL (ZYLOPRIM) 100 MG tablet Take 1 tablet (100 mg total) by mouth daily as needed.     ??? amLODIPine (NORVASC) 5 MG tablet Take 1 tablet (5 mg total) by mouth in the morning.     ??? calcitonin, salmon, (MIACALCIN) 200 unit/actuation nasal spray 1 spray into alternating nostrils in the morning.     ??? celecoxib (CELEBREX) 100 MG capsule TAKE 1 (ONE) CAPSULE BY MOUTH TWICE A DAY, MAX DAILY DOSE  2 CAPSULE     ??? cetirizine (ZYRTEC) 5 MG tablet TAKE 1 (ONE) TABLET BY MOUTH DAILY, MAX DAILY DOSE: 1 TABLET     ??? cycloSPORINE modified (NEORAL) 25 MG capsule Take 2 capsules (50 mg total) by mouth Two (2) times a day. 360 capsule 3   ??? dextrose (D10W) 10% Soln bolus Infuse 125 mL into a venous catheter every thirty (30) minutes as needed.  0   ??? dextrose 5 % and sodium chloride 0.9 % (D5-NS) infu Infuse 25 mL/hr into a venous catheter continuous as needed (if tube feed held or NPO).  0   ??? famotidine (PEPCID) 20 MG tablet 1 tablet (20 mg total) by G-tube route daily. (Patient taking differently: 20 mg  in the morning.) 30 tablet 0   ??? fluticasone propionate (FLONASE) 50 mcg/actuation nasal spray 1 spray into each nostril.     ??? food supplemt, lactose-reduced (ENSURE) Liqd Take 120 mL by mouth.     ??? gabapentin (NEURONTIN) 100 MG capsule Take 1 capsule (100 mg total) by mouth daily as needed.     ??? gentamicin 1 mg/mL, sodium citrate 4% Gent locks with dialysis 2.4 mL 4   ??? heparin sodium,porcine (HEPARIN, PORCINE,) 1,000 unit/mL 1000 unit/mL injection This heparin is for dialysis use ONLY. RN if you are unable to aspirate from both lumes, call provider piror to using the catheter. 1 mL 0   ??? heparin sodium,porcine (HEPARIN, PORCINE,) 5,000 unit/mL injection Inject 1 mL (5,000 Units total) under the skin every eight (8) hours. 1 mL 0   ??? levETIRAcetam (KEPPRA) 500 mg/100 mL PgBk 1 tablet.     ??? lipase/protease/amylase (PANCRELIPASE 16,000 ORAL) Pancrelipase 24000-76000     ??? loratadine (CLARITIN) 10 mg tablet Take 1 tablet (10 mg total) by mouth daily as needed.     ??? melatonin 3 mg Tab Take 1 tablet (3 mg total) by mouth nightly.  0   ??? metoprolol succinate (TOPROL-XL) 50 MG 24 hr tablet 1 tablet (50 mg total) daily.     ??? ondansetron (ZOFRAN) 4 MG tablet TAKE 1 TABLET BY MOUTH EVERY EIGHT HOURS AS NEEDED NAUSEA/VOMITING     ???  oxyCODONE (ROXICODONE) 5 MG immediate release tablet 1 tablet (5 mg total) by G-tube route every six (6) hours as needed. for up to 5 days (Patient taking differently: Take 5 mg by mouth every six (6) hours as needed.) 10 tablet 0   ??? vitamin B complx-C-FA-zinc cit (DIALYVITE 800 WITH ZINC 15) 0.8-15 mg Tab Take by mouth.     ??? vitamin E, dl, acetate, 81.1 mg (50 unit)/mL Drop Take 1 capsule by mouth.       No current facility-administered medications for this visit.        Changes to medications: Avian reports no changes at this time.    Allergies   Allergen Reactions   ??? Iodinated Contrast Media    ??? Ioxaglate Sodium    ??? Latex    ??? Succinylcholine Chloride      Other reaction(s): Other (See Comments)       Changes to allergies: No    SPECIALTY MEDICATION ADHERENCE     Cyclosporine 25mg   : 6 days of medicine on hand Medication Adherence    Patient reported X missed doses in the last month: 0  Specialty Medication: cyclosporine 25mg                       Specialty medication(s) dose(s) confirmed: Regimen is correct and unchanged.     Are there any concerns with adherence? No    Adherence counseling provided? Not needed    CLINICAL MANAGEMENT AND INTERVENTION      Clinical Benefit Assessment:    Do you feel the medicine is effective or helping your condition? Yes    Clinical Benefit counseling provided? Not needed    Adverse Effects Assessment:    Are you experiencing any side effects? No    Are you experiencing difficulty administering your medicine? No    Quality of Life Assessment:         How many days over the past month did your transplant  keep you from your normal activities? For example, brushing your teeth or getting up in the morning. 0    Have you discussed this with your provider? Not needed    Acute Infection Status:    Acute infections noted within Epic:  No active infections  Patient reported infection: None    Therapy Appropriateness:    Is therapy appropriate and patient progressing towards therapeutic goals? Yes, therapy is appropriate and should be continued    DISEASE/MEDICATION-SPECIFIC INFORMATION      N/A    PATIENT SPECIFIC NEEDS     - Does the patient have any physical, cognitive, or cultural barriers? No    - Is the patient high risk? No    - Does the patient require a Care Management Plan? No     SOCIAL DETERMINANTS OF HEALTH     At the [facility], we have learned that life circumstances - like trouble affording food, housing, utilities, or transportation can affect the health of many of our patients.   That is why we wanted to ask: are you currently experiencing any life circumstances that are negatively impacting your health and/or quality of life? Patient declined to answer    Social Determinants of Health     Financial Resource Strain: Not on file   Internet Connectivity: Not on file Food Insecurity: Not on file   Tobacco Use: Low Risk  (05/12/2022)    Patient History    ??? Smoking Tobacco Use: Never    ???  Smokeless Tobacco Use: Never    ??? Passive Exposure: Never   Housing/Utilities: Not on file   Alcohol Use: Not on file   Transportation Needs: Not on file   Substance Use: Not on file   Health Literacy: Not on file   Physical Activity: Not on file   Interpersonal Safety: Not on file   Stress: Not on file   Intimate Partner Violence: Not on file   Depression: Not at risk (04/29/2021)    PHQ-2    ??? PHQ-2 Score: 0   Social Connections: Not on file       Would you be willing to receive help with any of the needs that you have identified today? Not applicable       SHIPPING     Specialty Medication(s) to be Shipped:   Transplant: cyclosporine 25mg     Other medication(s) to be shipped: No additional medications requested for fill at this time     Changes to insurance: No    Delivery Scheduled: Yes, Expected medication delivery date: 06/21/2022.     Medication will be delivered via UPS to the confirmed prescription address in [facility].    The patient will receive a drug information handout for each medication shipped and additional FDA Medication Guides as required.  Verified that patient has previously received a Conservation officer, historic buildings and a Surveyor, mining.    The patient or caregiver noted above participated in the development of this care plan and knows that they can request review of or adjustments to the care plan at any time.      All of the patient's questions and concerns have been addressed.    Thad Ranger   [facility] Specialty Pharmacist

## 2022-06-20 MED FILL — CYCLOSPORINE MODIFIED 25 MG CAPSULE: ORAL | 90 days supply | Qty: 360 | Fill #1

## 2022-07-19 ENCOUNTER — Telehealth: Payer: Self-pay

## 2022-07-19 NOTE — Telephone Encounter (Signed)
Pt called stating that her arm was still sore and wasn't sure it could be "stuck".  Reviewed pt's chart, returned call for clarification, no answer, vm full.

## 2022-07-28 DIAGNOSIS — Z7289 Other problems related to lifestyle: Principal | ICD-10-CM

## 2022-07-28 DIAGNOSIS — Z0000 Encounter for general adult medical examination without abnormal findings: Principal | ICD-10-CM

## 2022-07-28 DIAGNOSIS — Z01818 Encounter for other preprocedural examination: Principal | ICD-10-CM

## 2022-07-28 DIAGNOSIS — R93429 Abnormal radiologic findings on diagnostic imaging of unspecified kidney: Principal | ICD-10-CM

## 2022-07-28 DIAGNOSIS — E1122 Type 2 diabetes mellitus with diabetic chronic kidney disease: Principal | ICD-10-CM

## 2022-07-28 DIAGNOSIS — N289 Disorder of kidney and ureter, unspecified: Principal | ICD-10-CM

## 2022-07-28 DIAGNOSIS — Z01810 Encounter for preprocedural cardiovascular examination: Principal | ICD-10-CM

## 2022-07-28 DIAGNOSIS — Z992 Dependence on renal dialysis: Principal | ICD-10-CM

## 2022-07-28 DIAGNOSIS — Z114 Encounter for screening for human immunodeficiency virus [HIV]: Principal | ICD-10-CM

## 2022-07-28 DIAGNOSIS — N186 End stage renal disease: Principal | ICD-10-CM

## 2022-08-10 ENCOUNTER — Other Ambulatory Visit (HOSPITAL_COMMUNITY): Payer: Self-pay

## 2022-08-23 NOTE — Unmapped (Signed)
[facility] and stated that she has the flu and can not attend her appointments. Will reschedule as requested/     -I just got off the phone with this patient and she has the flu. She needs to cancel all her appointments for tomorrow and reschedule [facility](Katherine Norton)

## 2022-08-24 NOTE — Unmapped (Signed)
Patient called in to discuss rescheduling evaluation appointments. scheduled while on phone. patient good with time and date.

## 2022-09-21 NOTE — Unmapped (Signed)
[facility] Refill Coordination Note    Specialty Medication(s) to be Shipped:   Transplant: cyclosporine 25mg     Other medication(s) to be shipped: No additional medications requested for fill at this time     Katherine Norton, DOB: 22-Jan-1956  Phone: [phone] (home)       All above HIPAA information was verified with patient.     Was a Nurse, learning disability used for this call? No    Completed refill call assessment today to schedule patient's medication shipment from the [facility] [phone]).  All relevant notes have been reviewed.     Specialty medication(s) and dose(s) confirmed: Regimen is correct and unchanged.   Changes to medications: Katherine Norton reports no changes at this time.  Changes to insurance: No  New side effects reported not previously addressed with a pharmacist or physician: None reported  Questions for the pharmacist: No    Confirmed patient received a Conservation officer, historic buildings and a Surveyor, mining with first shipment. The patient will receive a drug information handout for each medication shipped and additional FDA Medication Guides as required.       DISEASE/MEDICATION-SPECIFIC INFORMATION        N/A    SPECIALTY MEDICATION ADHERENCE     Medication Adherence    Patient reported X missed doses in the last month: 0  Specialty Medication: Cyclosproine 25mg   Patient is on additional specialty medications: No                                Were doses missed due to medication being on hold? No    Cyclosporine 25 mg: 2 days of medicine on hand     REFERRAL TO PHARMACIST     Referral to the pharmacist: Not needed      [facility]     Shipping address confirmed in Epic.     Delivery Scheduled: Yes, Expected medication delivery date: 09/23/22.     Medication will be delivered via UPS to the prescription address in Epic WAM.    Tera Helper, [facility]   [facility] Specialty Pharmacist

## 2022-09-22 ENCOUNTER — Encounter: Payer: Self-pay | Admitting: Hematology & Oncology

## 2022-09-22 MED FILL — CYCLOSPORINE MODIFIED 25 MG CAPSULE: ORAL | 90 days supply | Qty: 360 | Fill #2

## 2022-09-26 NOTE — Progress Notes (Unsigned)
No chief complaint on file.  HISTORY OF PRESENT ILLNESS:  09/26/22 ALL: Tami [name] returns for follow up for seizures. She continues levetiracetam 500mg  daily with extra 500mg  dose on dailysis days (M, W,F).   09/23/2021 ALL: Tami [name] returns for follow up for seizures. She continues levetiracetam 500mg  daily with extra 500mg  dose M,W,F. Previous EEG and MRI were unremarkable with last event 09/2018. She denies seizure activity since. She feels that she is doing fairly well. She does continue to note intermittent muscle jerks at night. May happen 1-2 times a month or may not occur for several months. She is alert and aware of symptoms. Always her upper extremities and usually both arms jump. Last a couple of seconds. No post ictal symptoms. She is tolerating meds. She continues dialysis. She is followed closely by PCP and nephrology. She lives alone. She drives without difficulty. She uses cane for stability and denies falls.   09/22/2020 ALL:  Tami [name] is a 66 y.o. female here today for follow up for seizures. She continues levetiracetam 500mg  daily with extra dose of 500mg  on dialysis days (M,W,F). She is tolerating medication well. No seizure activity. She does have some shaking of her whole body at night when in bed. She is aware of shaking and does not lose consciousness. She is able to get up and move around and shaking improves. She lost her son in a car accident last year and now lives alone. She has family locally that helps take care of her and her daughter visits often from [place]. She drives without difficulty.   HISTORY (copied from previous note)  UPDATE (06/18/19, VRP): Since last visit, doing well. Symptoms are stable. No seizures. No alleviating or aggravating factors. Tolerating levetiracetam.     PRIOR HPI (12/17/18): 66 year old female here for evaluation of seizures.  History of liver transplant, hypertension, end-stage renal disease on hemodialysis.   10/03/2018 patient was  at office visit, about to give blood when all of a sudden she became unresponsive and had some jerking movements.  Staff could not feel a pulse and started CPR.  Patient was immediately attended to by the rapid response team.  She was taken downstairs to the emergency room.  She may have had some type of gaze deviation initially resolved spontaneously.  While in the emergency room she had a second event with tonic-clonic seizures.  She was given IV Ativan and treated with Keppra.  She was admitted for further evaluation.   MRI, MRA of the head were obtained which showed no acute findings.  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  EEG was unremarkable.  Patient was discharged on no antiseizure medication.   Since that time patient is doing well.  No further events.   REVIEW OF SYSTEMS: Out of a complete 14 system review of symptoms, the patient complains only of the following symptoms, shaking at night and all other reviewed systems are negative.   ALLERGIES: Allergies  Allergen Reactions   Iodinated Contrast Media Other (See Comments)    UNSPECIFIED REACTION    Ioxaglate     UNSPECIFIED REACTION    Latex     UNSPECIFIED REACTION    Quelicin  [Succinylcholine Chloride] Other (See Comments)    Unknown reaction     HOME MEDICATIONS: Outpatient Medications Prior to Visit  Medication Sig Dispense Refill   acetaminophen (TYLENOL) 325 MG tablet Take 325 mg by mouth every 6 (six) hours as needed for mild  pain or headache.     albuterol (VENTOLIN HFA) 108 (90 Base) MCG/ACT inhaler Inhale 4 puffs into the lungs every 6 (six) hours as needed for wheezing or shortness of breath.     allopurinol (ZYLOPRIM) 100 MG tablet Take 200 mg by mouth daily.  5   B Complex-C-Zn-Folic Acid (DIALYVITE 542-HCWC 15) 0.8 MG TABS Take 1 tablet by mouth daily.     cycloSPORINE modified (NEORAL) 25 MG capsule Take 50 mg by mouth 2 (two) times daily.      ENSURE (ENSURE)  Take 237 mLs by mouth daily.     fluticasone (FLONASE) 50 MCG/ACT nasal spray Place 1 spray into both nostrils daily as needed for allergies or rhinitis.     gabapentin (NEURONTIN) 100 MG capsule Take 200 mg by mouth 2 (two) times daily as needed (pain).     levETIRAcetam (KEPPRA) 500 MG tablet Take 1 tablet Daily (Take additional 1 tablet at 6 pm on Mon,Wed and Friday) (Patient taking differently: Take 500 mg by mouth See admin instructions. Take 1 tablet Daily (Take additional 1 tablet at 6 pm on Mon,Wed and Friday)) 125 tablet 1   lidocaine-prilocaine (EMLA) cream APPLY SMALL AMOUNT TO ACCESS SITE (AVF) 30 - 60 MINUTES BEFORE DIALYSIS. COVER WITH OCCLUSIVE DRESSING ( SARAN WRAP)     loratadine (CLARITIN) 10 MG tablet Take 10 mg by mouth daily as needed for allergies. X 10 days     losartan (COZAAR) 50 MG tablet Take 50 mg by mouth at bedtime.     metoprolol succinate (TOPROL-XL) 25 MG 24 hr tablet Take 12.5 mg by mouth daily.     ondansetron (ZOFRAN ODT) 4 MG disintegrating tablet Dissolve 1 tablet (4 mg total) by mouth every 8 (eight) hours as needed for nausea or vomiting. 20 tablet 0   oxyCODONE (OXY IR/ROXICODONE) 5 MG immediate release tablet Take 5 mg by mouth every 6 (six) hours as needed for pain.     oxyCODONE-acetaminophen (PERCOCET) 5-325 MG tablet Take 1 tablet by mouth every 4 (four) hours as needed for severe pain. 12 tablet 0   Pancrelipase, Lip-Prot-Amyl, 24000-76000 units CPEP Take 1 capsule by mouth See admin instructions. Take 1 capsule with every meal and every snack.     pantoprazole (PROTONIX) 40 MG tablet Take 40 mg by mouth in the morning and at bedtime.     PARoxetine (PAXIL) 10 MG tablet Take 10 mg by mouth daily.     progesterone (PROMETRIUM) 100 MG capsule Take 100 mg by mouth daily.     sildenafil (REVATIO) 20 MG tablet Take 20 mg by mouth daily.     sucroferric oxyhydroxide (VELPHORO) 500 MG chewable tablet Chew 500-1,000 mg by mouth See admin instructions. Take 2  tablets three times a day with each meal and 1 tablets with each snack.     Vitamin D, Ergocalciferol, (DRISDOL) 1.25 MG (50000 UT) CAPS capsule Take 50,000 Units by mouth once a week. Sunday     No facility-administered medications prior to visit.     PAST MEDICAL HISTORY: Past Medical History:  Diagnosis Date   Anemia    Arthritis    ESRD (end stage renal disease) on dialysis ([place])    "MWF; [place]" (10/04/2018)   Gout    Hepatitis, autoimmune ([place]) 12/08/2011   Hypertension    Seizures ([place]) 10/03/2018     PAST SURGICAL HISTORY: Past Surgical History:  Procedure Laterality Date   ABDOMINAL HYSTERECTOMY     AV FISTULA PLACEMENT Left  10/02/2018   Procedure: Creation of left arm Brachiocephalic Fistula;  Surgeon: Waynetta Sandy, MD;  Location: [facility] OR;  Service: Vascular;  Laterality: Left;   FISTULA SUPERFICIALIZATION Left 11/29/2018   Procedure: TRANSLOCATION/FISTULA SUPERFICIALIZATION OF LEFT ARM FISTULA;  Surgeon: Marty Heck, MD;  Location: [facility] OR;  Service: Vascular;  Laterality: Left;   LIVER TRANSPLANT     1996   REVISON OF ARTERIOVENOUS FISTULA Left 05/10/2022   Procedure: EXISION OF ULCER OVERLYING  ARTERIOVENOUS FISTULA;  Surgeon: Angelia Mould, MD;  Location: [facility] OR;  Service: Vascular;  Laterality: Left;   TONSILLECTOMY       FAMILY HISTORY: Family History  Problem Relation Age of Onset   Stroke Mother    Cancer Father    Lupus Sister        1 sister   Hypertension Sister    Cancer Brother        prostate- 1 brother   Lupus Brother      SOCIAL HISTORY: Social History   Socioeconomic History   Marital status: Divorced    Spouse name: Not on file   Number of children: 2   Years of education: some college   Highest education level: Not on file  Occupational History    Comment: na  Tobacco Use   Smoking status: Never   Smokeless tobacco: Never   Tobacco comments:    never used tobacco  Vaping Use   Vaping Use: Never  used  Substance and Sexual Activity   Alcohol use: Not Currently    Alcohol/week: 0.0 standard drinks of alcohol   Drug use: No   Sexual activity: Not on file  Other Topics Concern   Not on file  Social History Narrative   Lives with son   Caffeine - tea , little    Social Determinants of Health   Financial Resource Strain: Not on file  Food Insecurity: Not on file  Transportation Needs: Not on file  Physical Activity: Not on file  Stress: Not on file  Social Connections: Not on file  Intimate Partner Violence: Not on file      PHYSICAL EXAM  There were no vitals filed for this visit.   There is no height or weight on file to calculate BMI.   Generalized: Well developed, in no acute distress  Cardiology: normal rate and rhythm, no murmur auscultated  Respiratory: clear to auscultation bilaterally    Neurological examination  Mentation: Alert oriented to time, place, history taking. Follows all commands speech and language fluent Cranial nerve II-XII: Pupils were equal round reactive to light. Extraocular movements were full, visual field were full on confrontational test. Facial sensation and strength were normal. Head turning and shoulder shrug  were normal and symmetric. Motor: The motor testing reveals 5 over 5 strength of bilateral upper extremities, 4/5 left hip flexion, 3+/4 right hip flexion.  Sensory: Sensory testing is intact to soft touch on all 4 extremities. No evidence of extinction is noted.  Coordination: Cerebellar testing reveals good finger-nose-finger and heel-to-shin bilaterally.  Gait and station: Gait is stable with cane. Tandem not attempted  Reflexes: Deep tendon reflexes are reduced but symmetric bilaterally.     DIAGNOSTIC DATA (LABS, IMAGING, TESTING) - I reviewed patient records, labs, notes, testing and imaging myself where available.  Lab Results  Component Value Date   WBC 4.5 02/20/2020   HGB 11.6 (L) 05/10/2022   HCT 34.0 (L)  05/10/2022   MCV 101.2 (H) 02/20/2020   PLT  106 (L) 02/20/2020      Component Value Date/Time   NA 135 05/10/2022 0842   NA 137 08/10/2018 0000   NA 140 10/13/2017 1106   NA 138 04/11/2017 1023   K 5.6 (H) 05/10/2022 0842   K 5.3 no visable hemolysis (H) 10/13/2017 1106   K 5.2 No visable hemolysis (H) 04/11/2017 1023   CL 97 (L) 05/10/2022 0842   CL 104 10/13/2017 1106   CO2 32 02/20/2020 1411   CO2 17 (L) 10/13/2017 1106   CO2 19 (L) 04/11/2017 1023   GLUCOSE 77 05/10/2022 0842   GLUCOSE 116 10/13/2017 1106   BUN 34 (H) 05/10/2022 0842   BUN 1 (A) 08/10/2018 0000   BUN 58 (H) 10/13/2017 1106   BUN 33.4 (H) 04/11/2017 1023   CREATININE 4.80 (H) 05/10/2022 0842   CREATININE 4.00 (HH) 02/20/2020 1411   CREATININE 5.9 (HH) 10/13/2017 1106   CREATININE 1.8 (H) 04/11/2017 1023   CALCIUM 9.3 02/20/2020 1411   CALCIUM 8.4 10/13/2017 1106   CALCIUM 9.0 04/11/2017 1023   PROT 7.9 02/20/2020 1411   PROT 7.7 10/13/2017 1106   PROT 8.8 (H) 04/11/2017 1023   ALBUMIN 3.6 02/20/2020 1411   ALBUMIN 3.3 10/13/2017 1106   ALBUMIN 3.7 04/11/2017 1023   AST 15 02/20/2020 1411   AST 16 04/11/2017 1023   ALT 5 02/20/2020 1411   ALT 15 10/13/2017 1106   ALT 8 04/11/2017 1023   ALKPHOS 71 02/20/2020 1411   ALKPHOS 68 10/13/2017 1106   ALKPHOS 80 04/11/2017 1023   BILITOT 0.5 02/20/2020 1411   BILITOT 0.54 04/11/2017 1023   GFRNONAA 11 (L) 02/20/2020 1411   GFRAA 13 (L) 02/20/2020 1411   Lab Results  Component Value Date   CHOL 124 10/03/2018   HDL 23 (L) 10/03/2018   LDLCALC 73 10/03/2018   TRIG 138 10/03/2018   CHOLHDL 5.4 10/03/2018   No results found for: "HGBA1C" Lab Results  Component Value Date   VITAMINB12 301 10/03/2018   Lab Results  Component Value Date   TSH 0.831 10/03/2018    ASSESSMENT AND PLAN  66 y.o. year old female  has a past medical history of Anemia, Arthritis, ESRD (end stage renal disease) on dialysis ([place]), Gout, Hepatitis, autoimmune ([place])  (12/08/2011), Hypertension, and Seizures ([place]) (10/03/2018). here with   No diagnosis found.  Tami [name] is doing well, today.  She continues levetiracetam is tolerating well.  We will continue 500 mg daily with an extra dose of 500 mg on Monday Wednesday Friday.  Refills have been sent to the pharmacy. She is concerned about continued muscle jerks. Symptoms occur randomly. No clear concerns of seizure activity. I will repeat EEG to evaluate for any epileptic concerns. She will continue close follow-up with care team.  Healthy lifestyle habits encouraged.  She will follow-up with me in 1 year, sooner if needed.  She verbalizes understanding and agreement with this plan.   Debbora Presto, MSN, FNP-C 09/26/2022, 3:31 PM  [facility] [address], [place],  [address] [phone]

## 2022-09-26 NOTE — Patient Instructions (Signed)
Below is our plan:  We will continue levetiracetam 500mg  daily and extra dose of 500mg  on dialysis days (Monday Wednesdays and Fridays)  Please make sure you are staying well hydrated. I recommend 50-60 ounces daily. Well balanced diet and regular exercise encouraged. Consistent sleep schedule with 6-8 hours recommended.   Please continue follow up with care team as directed.   Follow up with me in 1 year   You may receive a survey regarding today's visit. I encourage you to leave honest feed back as I do use this information to improve patient care. Thank you for seeing me today!

## 2022-09-27 ENCOUNTER — Ambulatory Visit (INDEPENDENT_AMBULATORY_CARE_PROVIDER_SITE_OTHER): Payer: Medicare Other | Admitting: Family Medicine

## 2022-09-27 ENCOUNTER — Encounter: Payer: Self-pay | Admitting: Family Medicine

## 2022-09-27 VITALS — BP 103/66 | HR 88 | Ht 60.0 in | Wt 112.5 lb

## 2022-09-27 DIAGNOSIS — N186 End stage renal disease: Secondary | ICD-10-CM | POA: Diagnosis not present

## 2022-09-27 DIAGNOSIS — R569 Unspecified convulsions: Secondary | ICD-10-CM | POA: Diagnosis not present

## 2022-09-27 DIAGNOSIS — E1122 Type 2 diabetes mellitus with diabetic chronic kidney disease: Secondary | ICD-10-CM

## 2022-09-27 DIAGNOSIS — Z992 Dependence on renal dialysis: Secondary | ICD-10-CM | POA: Diagnosis not present

## 2022-09-27 MED ORDER — LEVETIRACETAM 500 MG PO TABS: ORAL_TABLET | ORAL | 3 refills | Status: DC

## 2022-09-29 ENCOUNTER — Ambulatory Visit: Payer: Medicare Other | Admitting: Family Medicine

## 2022-10-27 ENCOUNTER — Ambulatory Visit: Admit: 2022-10-27 | Discharge: 2022-10-27 | Payer: MEDICARE

## 2022-10-27 ENCOUNTER — Institutional Professional Consult (permissible substitution): Admit: 2022-10-27 | Discharge: 2022-10-27 | Payer: MEDICARE

## 2022-10-27 NOTE — Unmapped (Signed)
PATIENT NAME: Katherine Norton        MR#: [id number]                DOB: 12/04/1955        [facility]  CONFIDENTIAL SOCIAL WORK  KIDNEY TRANSPLANT ASSESSMENT            DATE OF EVALUATION: 10/27/22     INFORMANTS: Dellie Catholic Florentina Jenny, family/brother, Katherine Norton     PREFERRED LANGUAGE: English      TRANSPLANT PHASE:         Evaluation   TRANSPLANT STATUS:       Active since 03/19/20     The limits of confidentiality and the purpose of the evaluation were reviewed. The patient was provided with a verbal description of the nature and purpose of the social work evaluation. I also reviewed the referral source, specific referral question for this evaluation, foreseeable risks/discomforts, benefits, limits of confidentiality, and mandatory reporting requirements of this provider. The patient was given the opportunity to ask questions and receive answers about the present evaluation. Oral consent was provided by the patient.      PRESENTING MEDICAL PROBLEMS AND RELEVANT HISTORY:   Katherine Norton is a 67 y.o. African American female who  has a past medical history of Chronic kidney disease, Cirrhosis (CMS-HCC), and HTN (hypertension).      Patient denies recent hospitalizations.      FUNCTIONAL STATUS:   Katherine Norton presents as receiving help with personal ADLs, including cooking and household chores. She states she is able to bathe and dress herself. Patient is not driving.      Patient is receiving Medicaid personal care services about three hours per day. Her physician thought she needed help with household chores/light housekeeping.      DME: uses cane at home and brought to clinic; has rollator walker as well, no additional medical equipment at home     Activity level/Functional Status: did PT in 2019 and states she sometimes doe those exercises at home.      UNDERSTANDING OF MEDICAL CONDITION AND TRANSPLANT:   Katherine Norton and her brother attended the Transplant Orientation in May 2021.     Patient verbalizes understanding that last hospitalization where I got sick caused her kidney disease.     Pt understands medical illness process: well  Pt understands transplant process/psychosocial risks:well   Education provided: orientation class information regarding transplant process and guidelines were reviewed, including psychosocial risks.     SUPPORT PLAN FOR TRANSPLANTATION:      Plan details: Patient plans to recover under the care of her brother, Gardiner Barefoot and sister, Katherine Norton. Katherine Norton and/or Gardiner Barefoot could stay with her at home. [name] is in her early 63s and lives in [place], [place]. She is retired, flexible in terms of recovery. [name] [phone]) is an additional support as well. Both are ambulatory and driving. Katherine Norton lives in [place], [place] and [name] lives in [place], but could travel up to [place]. Patient's daughter, Katherine Norton, is a back-up support to her, but lives in [place] so would be a backup as well.      Patient realistic in realizing her brother needs a break and will need assistance with caregiving responsibilities. Her brother presents as a committed, caring support to her.     Patient's brother is realistic in recognizing that recovery as a process.      Primary Support/ Relationship to patient: Katherine Norton  Age/DOB: 67  Understanding of medical directions: feels  comfortable  Employment: retired, from drove trucks for 30 years    Support limitations: no health concerns that that would interfere with caregiving, notes no current family members needing caregiving support   Support strengths: historical caregiver, health literate, access to reliable vehicle , comfortable driving to [facility] and no other CG responsibilities, notes has been a caregiver for many family members, lives in [place]      Back-up Support/ Relationship to patient: two sisters, Katherine Norton and Katherine Norton    Back-up Support: Birgitta Gehle (limited back-up support, does travel for work, but anticipated to be able to assist for a week)     Advance Directive: Education and form were provided.     DIALYSIS HISTORY:   Dialysis History        Start End Type Center Comments    08/23/2021   [facility]     07/23/2018  [facility]-center Hemodialysis [facility]               Current Dialysis Center Information       [facility]       Phone: [phone] Fax: [phone]    Address:  [address]  [place] [address]              [facility]       Phone: [phone] Fax: [phone]    Address:  [address]  [place] [address]                              Patient runs for 3.5 hours on a M/W/F 1st shift schedule.Patient states that she has been compliant with all dialysis recommendations and provided verbal permission for this CSW to contact her dialysis center.  Patient states she is is committed to completing her dialysis treatments.      Transportation:   Self: drives locally sometimes   Dialysis:uses Medicaid transportation   Distance from Home to [facility]: [address]  [place] [address]     ATTITUDE ABOUT TRANSPLANT:   Expectations:  patient states she continues to desire txp, and states she knows there are risks to transplant, however, she is hopeful for freedom from dialysis   Fears/Concerns: no     SOCIAL HISTORY:   Citizenship Status: [name] Citizen   Personal History: Terramuggus Norton   Marital Status: divorced   Lives with: alone   Children/Dependents:  Katherine Norton (daughter)  Social support: daughter, brother   Housing: moved into new apt, handicap, public housing, stable there     COMPLIANCE HISTORY:   Problems obtaining medications or attending appointments (including transportation): denies, take Medicaid transportation  Problems organizing medications: denies, organizes self, uses pillbox, consistent with taking medications     EDUCATION AND WORK HISTORY:   Highest completed grade level: a little college   Currently employed: no  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 History: none                 MEDICAL/PRESCRIPTION COVERAGE:   [employer] assistance:no  Payer/Plan Subscriber Name Rel Member # Group #   MEDICARE - MEDICARE PMELYNA, ATCHLEY Self [id number]        [address]   MEDICAID [place] - MEDICAIKAMSIYOCHUKWU, FRIDDLE* Self [id number]        [id number]         FINANCIAL RESOURCES:  Current income sources: SSI, food stamps   Monthly expenses: rent (Section 8 housing), usual customary expenses  Financial Risks and Debts: none  Current income meets basic needs: yes, has been managing sucesssfully since liver transplant over 25 years ago       STRESSORS/COPING STYLE:  Patient denies current stressors. Patient enjoys going to places with her grandson (age 61); reading, cooking, and faith is source of support.      Religious/Spiritual Beliefs:  [employer]     PSYCHIATRIC HISTORY:   Current issues/mood: good mood this morning, had a lot of energy, mood wonderful, daughter cooked and stayed for a week, brother thinks mood has improved a lot, talk with brother/daughter  Past issues: depression  Medications: takes medication for sleep about once a week (did not know name, could not find n chart)  Therapy: states saw a therapist in April 2022 (saw her every week for maybe 4-5 times, found it helpful), saw for grief/loss, has had pastoral coub  SI/HI: denies   Hospitalizations: denies   Loss/Trauma/Violence: grief/loss of son, denies additional concerns of trauma/violence   Mania: no  Psychosis: none   Hx of adverse reactions to treatment/medication/steroids: not assessed   PHQ-2 Score: 0  GAD-2 Score: o     They agreed to discuss any changes in the patient's mood or behavior with the medical team.     MENTAL STATUS:   Affect: normal, mood congruent  Appearance: well groomed hair, well dressed, season and temperature appropriate  Attention Span: normal attention span  Attitude: friendly, cooperative, interested, attentive  Behavior: calm, cooperative, appropriate eye contact, no psychomotor agitation/retardation noted  Insight & Judgement: intact/appropriate, reliable insight  Level of Consciousness: alert  Mood: euthymic/normal/stable  Orientation: person, place, time, date  Speech: normal speech  Thought Content: logical connections     COGNITIVE HISTORY & HEALTH LITERACY:   Ms. Fietz does  seem cognitively intact. She denies history of memory or cognitive concerns related to her health. Her brother confirms no changes as well.     Patient's REALM indicates high risk for low health literacy. Patient prefers verbal/hands on and brothers helps as needed with reading/reviewing medical info.      Do you need to have someone help you when you read instructions, pamphlets, or other written material from your doctor or pharmacy? No     Hx of special education or learning challenges: none      SUBSTANCE HISTORY:   Tobacco: [place]  reports that she has never smoked. She has never used smokeless tobacco.  Alcohol: Buna  reports no history of alcohol use.  Illicit Substances: Meosha  reports no history of drug use.     CHRONIC PAIN HISTORY (and referral needs): Patient takes oxycodone 5 mg, only when needed, once every few days, lower back pain, followed by local pain clinic        LEGAL HISTORY:   Ms. Rolfe denies current or past history of legal issues.     A review of [place] DPS Offender Public Information website revealed no history of criminal charges.     COLLATERAL CONTACT:    Bailey Spells was interviewed with her brother for the beginning of the interview and alone for more sensitive questions.    No hx of adherence concerns noted, therefore updated collateral contact was not obtained.      Called patient's dialysis center on 05/11/21 and spoke with RN there. Dialysis staff denies concerns about adherence to complex regimen, consistent  attendance to dialysis, medication adherence and relationships with staff.         EDUCATION:  KIDNEY Transplant Process  Psychosocial risks of transplant, including depression, anxiety, guilt, PTSD  CKD/ESRD can often cause stress on the entire family unit, not just the patient  Advance Care Planning  Long-term financial and vocational planning  Expectations for support planning both pre- and post-transplant  Transplant social worker availability throughout transplant process  Engineer, manufacturing systems Forms  Kidney Transplant Patients and their Support Persons     ASSESSMENT AND RECOMMENDATIONS:      Milenka Sunada is a 67 y.o. female who presents for kidney transplant evaluation. Patient is cooperative and engaged throughout the assessment. 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 Patient presents as motivated for transplant.      Patient has a number of psychosocial strengths, including motivation for transplant, adherence to medical treatment, adequate financial resources  and denial of substance abuse concerns. Patient has a hx of depression and reports stable mental health; no significant mood concerns noted today.     Patient has some limitations to her functional status and receives Medicaid personal care services. She is presently able to dress and bathe herself, and receives assistance with light housekeeping tasks at home. Patient lives alone at home. It is important for patient to have a strong caregiving plan, and patient and her brother both present as realistic in recognizing this. Patient presents with appropriate caregiving plan at this time.  Patient plans to recover under the care of her brother, Gardiner Barefoot and sister, Katherine Norton. Katherine Norton and/or Gardiner Barefoot could stay with her at home. [name] is in her early 45s and lives in [place], [place] (she is retired). [name] [phone]) is an additional support as well. Both are ambulatory and driving. Katherine Norton lives in [place], [place] and [name] lives in [place], but could travel up to [place]. Patient's daughter, Linsdey Casel, is a back-up support to her, but lives in [place] so would be a backup as well. Patient is realistic in recognizing that her brother needs a break and will need assistance with caregiving responsibilities. Her brother  presents as a committed, caring support to her.    SIPAT SCORE: 12     Final decision regarding listing status is based upon committee review at selection meeting.     Recommendations and Plan (not required for listing):   1. Annual SW visits and close monitoring of caregiving plan  2. Consider advance directive  3. Consider transplant dietician consult (pending patient interest and availability via telehealth)  CSW to provide continued support and encouragement.     Evelena Asa, LCSW, CCTSW  Transplant Case Manager  [facility] for Transplant Care

## 2022-11-17 DIAGNOSIS — N186 End stage renal disease: Principal | ICD-10-CM

## 2022-11-17 DIAGNOSIS — Z992 Dependence on renal dialysis: Principal | ICD-10-CM

## 2022-11-17 DIAGNOSIS — Z0000 Encounter for general adult medical examination without abnormal findings: Principal | ICD-10-CM

## 2022-11-17 DIAGNOSIS — Z01810 Encounter for preprocedural cardiovascular examination: Principal | ICD-10-CM

## 2022-11-17 DIAGNOSIS — Z01818 Encounter for other preprocedural examination: Principal | ICD-10-CM

## 2022-11-17 NOTE — Unmapped (Signed)
Spoke with pt and confirmed phone visit on 12/20/22. Confirmed address and sent letter.

## 2022-11-28 ENCOUNTER — Other Ambulatory Visit: Payer: Self-pay

## 2022-11-28 MED ORDER — LEVETIRACETAM 500 MG PO TABS: ORAL_TABLET | ORAL | 0 refills | Status: DC

## 2022-12-01 NOTE — Unmapped (Signed)
Error

## 2022-12-20 ENCOUNTER — Institutional Professional Consult (permissible substitution): Admit: 2022-12-20 | Discharge: 2022-12-21 | Payer: MEDICARE

## 2022-12-20 NOTE — Unmapped (Signed)
TFC met with patient, Katherine Norton, to discuss insurance benefits related to Kidney transplant.  We discussed current insurance benefits, prescription drug benefits, estimates of possible post-transplant medications and other potential out-of-pocket expenses.     As part of this consultation Katherine Norton was advised to:  Consider fundraising to help with transplant expenses    Patient verbalized understanding and received Financial Information for Transplant letter.      TFC informed patient of [place] and [facility] negotiation in place. Patient verbalized understanding of out of network if negotiation not agreed between both [facility] and [facility].

## 2022-12-26 NOTE — Unmapped (Signed)
The [facility] has made a third and final attempt to reach this patient to refill the following medication:cyclosporine.      We have been unable to leave messages on the following phone numbers: [phone] .    Dates contacted: 2/19, 2/23, 3/4  Last scheduled delivery: 09/22/22 for 90 days    The patient may be at risk of non-compliance with this medication. The patient should call the [facility] at [phone]  Option 4, then Option 2 (all other specialty patients) to refill medication.    Tera Helper, [facility]   [facility] Specialty Pharmacist

## 2022-12-27 NOTE — Unmapped (Signed)
[facility] Refill Coordination Note    Specialty Medication(s) to be Shipped:   Transplant: cyclosporine 25 mg    Other medication(s) to be shipped: No additional medications requested for fill at this time     Katherine Norton, DOB: 1955-12-04  Phone: [phone] (home)       All above HIPAA information was verified with patient.     Was a Nurse, learning disability used for this call? No    Completed refill call assessment today to schedule patient's medication shipment from the [facility] [phone]).  All relevant notes have been reviewed.     Specialty medication(s) and dose(s) confirmed: Regimen is correct and unchanged.   Changes to medications: Deysy reports no changes at this time.  Changes to insurance: No  New side effects reported not previously addressed with a pharmacist or physician: None reported  Questions for the pharmacist: No    Confirmed patient received a Conservation officer, historic buildings and a Surveyor, mining with first shipment. The patient will receive a drug information handout for each medication shipped and additional FDA Medication Guides as required.       DISEASE/MEDICATION-SPECIFIC INFORMATION        N/A    SPECIALTY MEDICATION ADHERENCE     Medication Adherence    Patient reported X missed doses in the last month: 0  Specialty Medication: cycloSPORINE modified 25 MG capsule (NeoraL)  Patient is on additional specialty medications: No              Were doses missed due to medication being on hold? No    cyclosporine 25 mg: 5 days of medicine on hand       REFERRAL TO PHARMACIST     Referral to the pharmacist: Not needed      [facility]     Shipping address confirmed in Epic.     Patient was notified of new phone menu : No    Delivery Scheduled: Yes, Expected medication delivery date: 12/29/22.     Medication will be delivered via UPS to the prescription address in Epic WAM.    Quintella Reichert   [facility] Specialty Technician

## 2022-12-28 MED FILL — CYCLOSPORINE MODIFIED 25 MG CAPSULE: ORAL | 90 days supply | Qty: 360 | Fill #3

## 2023-01-16 NOTE — Unmapped (Signed)
Spoke with pt and confirmed rescheduled surgeon appointment to 04/06/23. Provider unavailable on day of testing.

## 2023-01-27 NOTE — Unmapped (Signed)
4/5 - Contacted pt to schedule for annual liver txp follow up, per call able to secure date for in person appts with labs & provider appt. Pt okay with appt letter by mail. Pt verbalized understanding all discussed.

## 2023-02-09 DIAGNOSIS — Z944 Liver transplant status: Principal | ICD-10-CM

## 2023-02-09 DIAGNOSIS — Z796 Long-term use of immunosuppressant medication: Principal | ICD-10-CM

## 2023-02-09 NOTE — Unmapped (Signed)
Lab order added.  

## 2023-02-15 NOTE — Unmapped (Signed)
Received a message that this patient needs to cancel 4/25 appointments.  Called and spoke to patient and confirmed rescheduled Kidney Txp Eval appointments.  Letter sent via mail.

## 2023-03-02 ENCOUNTER — Telehealth: Payer: Self-pay | Admitting: Family Medicine

## 2023-03-02 MED ORDER — LEVETIRACETAM 500 MG PO TABS: ORAL_TABLET | ORAL | 3 refills | Status: DC

## 2023-03-02 NOTE — Telephone Encounter (Signed)
Pt is requesting a refill for levETIRAcetam (KEPPRA) 500 MG tablet.  Pharm[facility] [phone]

## 2023-03-02 NOTE — Telephone Encounter (Signed)
E-scribed refill 

## 2023-03-14 ENCOUNTER — Ambulatory Visit: Admit: 2023-03-14 | Discharge: 2023-03-15 | Payer: MEDICARE

## 2023-03-14 ENCOUNTER — Ambulatory Visit: Admit: 2023-03-14 | Discharge: 2023-03-27 | Payer: MEDICARE

## 2023-03-14 ENCOUNTER — Ambulatory Visit: Admit: 2023-03-14 | Discharge: 2023-03-16 | Payer: MEDICARE

## 2023-03-14 ENCOUNTER — Ambulatory Visit: Admit: 2023-03-14 | Payer: MEDICARE

## 2023-03-14 ENCOUNTER — Encounter: Admit: 2023-03-14 | Discharge: 2023-03-15 | Payer: MEDICARE | Attending: Nephrology | Primary: Nephrology

## 2023-03-14 LAB — BILIRUBIN, DIRECT: BILIRUBIN DIRECT: 0.2 mg/dL (ref 0.00–0.30)

## 2023-03-14 LAB — COMPREHENSIVE METABOLIC PANEL
ALBUMIN: 3.3 g/dL — ABNORMAL LOW (ref 3.4–5.0)
ALKALINE PHOSPHATASE: 103 U/L (ref 46–116)
ALT (SGPT): <7 U/L — ABNORMAL LOW (ref 10–49)
ANION GAP: 11 mmol/L (ref 5–14)
AST (SGOT): 16 U/L (ref <=34)
BILIRUBIN TOTAL: 0.4 mg/dL (ref 0.3–1.2)
BLOOD UREA NITROGEN: 25 mg/dL — ABNORMAL HIGH (ref 9–23)
BUN / CREAT RATIO: 5
CALCIUM: 9.2 mg/dL (ref 8.7–10.4)
CHLORIDE: 97 mmol/L — ABNORMAL LOW (ref 98–107)
CO2: 31 mmol/L (ref 20.0–31.0)
CREATININE: 5.15 mg/dL — ABNORMAL HIGH
EGFR CKD-EPI (2021) FEMALE: 9 mL/min/{1.73_m2} — ABNORMAL LOW (ref >=60)
GLUCOSE RANDOM: 78 mg/dL (ref 70–99)
POTASSIUM: 3.8 mmol/L (ref 3.4–4.8)
PROTEIN TOTAL: 8.9 g/dL — ABNORMAL HIGH (ref 5.7–8.2)
SODIUM: 139 mmol/L (ref 135–145)

## 2023-03-14 LAB — CBC W/ AUTO DIFF
BASOPHILS ABSOLUTE COUNT: 0 10*9/L (ref 0.0–0.1)
BASOPHILS RELATIVE PERCENT: 0.8 %
EOSINOPHILS ABSOLUTE COUNT: 0 10*9/L (ref 0.0–0.5)
EOSINOPHILS RELATIVE PERCENT: 1.6 %
HEMATOCRIT: 34.3 % (ref 34.0–44.0)
HEMOGLOBIN: 11.6 g/dL (ref 11.3–14.9)
LYMPHOCYTES ABSOLUTE COUNT: 0.8 10*9/L — ABNORMAL LOW (ref 1.1–3.6)
LYMPHOCYTES RELATIVE PERCENT: 30.9 %
MEAN CORPUSCULAR HEMOGLOBIN CONC: 33.9 g/dL (ref 32.0–36.0)
MEAN CORPUSCULAR HEMOGLOBIN: 31.4 pg (ref 25.9–32.4)
MEAN CORPUSCULAR VOLUME: 92.6 fL (ref 77.6–95.7)
MEAN PLATELET VOLUME: 8.3 fL (ref 6.8–10.7)
MONOCYTES ABSOLUTE COUNT: 0.3 10*9/L (ref 0.3–0.8)
MONOCYTES RELATIVE PERCENT: 13.4 %
NEUTROPHILS ABSOLUTE COUNT: 1.3 10*9/L — ABNORMAL LOW (ref 1.8–7.8)
NEUTROPHILS RELATIVE PERCENT: 53.3 %
PLATELET COUNT: 132 10*9/L — ABNORMAL LOW (ref 150–450)
RED BLOOD CELL COUNT: 3.71 10*12/L — ABNORMAL LOW (ref 3.95–5.13)
RED CELL DISTRIBUTION WIDTH: 15.8 % — ABNORMAL HIGH (ref 12.2–15.2)
WBC ADJUSTED: 2.5 10*9/L — ABNORMAL LOW (ref 3.6–11.2)

## 2023-03-14 LAB — SYPHILIS SCREEN: SYPHILIS RPR SCREEN: NONREACTIVE

## 2023-03-14 LAB — URIC ACID: URIC ACID: 4.7 mg/dL

## 2023-03-14 LAB — TOXOPLASMA GONDII ANTIBODY, IGG: TOXOPLASMA GONDII IGG: NEGATIVE

## 2023-03-14 LAB — HEPATITIS B SURFACE ANTIBODY
HEPATITIS B SURFACE ANTIBODY QUANT: <8 m[IU]/mL (ref <8.00)
HEPATITIS B SURFACE ANTIBODY: NONREACTIVE

## 2023-03-14 LAB — VARICELLA ZOSTER ANTIBODY, IGG: VARICELLA ZOSTER IGG: POSITIVE

## 2023-03-14 LAB — HSV ANTIBODIES, IGG
HERPES SIMPLEX VIRUS 1 IGG: POSITIVE — AB
HERPES SIMPLEX VIRUS 2 IGG: NEGATIVE
HSV 2 IGG OD: 0.64

## 2023-03-14 LAB — LIPID PANEL
CHOLESTEROL/HDL RATIO SCREEN: 5 — ABNORMAL HIGH (ref 1.0–4.5)
CHOLESTEROL: 144 mg/dL (ref <=200)
HDL CHOLESTEROL: 29 mg/dL — ABNORMAL LOW (ref 40–60)
LDL CHOLESTEROL CALCULATED: 96 mg/dL (ref 40–99)
NON-HDL CHOLESTEROL: 115 mg/dL (ref 70–130)
TRIGLYCERIDES: 97 mg/dL (ref 0–150)
VLDL CHOLESTEROL CAL: 19.4 mg/dL (ref 11–41)

## 2023-03-14 LAB — HEMOGLOBIN A1C
ESTIMATED AVERAGE GLUCOSE: 80 mg/dL
HEMOGLOBIN A1C: 4.4 % — ABNORMAL LOW (ref 4.8–5.6)

## 2023-03-14 LAB — TRANSPLANT IMMUNE STATUS - EBV: EPSTEIN-BARR VCA IGG ANTIBODY: POSITIVE — AB

## 2023-03-14 LAB — HEPATITIS B CORE ANTIBODY, TOTAL: HEPATITIS B CORE TOTAL ANTIBODY: NONREACTIVE

## 2023-03-14 LAB — HEPATITIS A IGG: HEPATITIS A IGG: NONREACTIVE

## 2023-03-14 LAB — RUBELLA ANTIBODY, IGG: RUBELLA IGG SCREEN: POSITIVE

## 2023-03-14 LAB — HLA ANTIBODY SCREEN

## 2023-03-14 LAB — GAMMA GT: GAMMA GLUTAMYL TRANSFERASE: 17 U/L

## 2023-03-14 LAB — HIV ANTIGEN/ANTIBODY COMBO: HIV ANTIGEN/ANTIBODY COMBO: NONREACTIVE

## 2023-03-14 LAB — HEPATITIS C ANTIBODY: HEPATITIS C ANTIBODY: NONREACTIVE

## 2023-03-14 LAB — HEPATITIS B SURFACE ANTIGEN: HEPATITIS B SURFACE ANTIGEN: NONREACTIVE

## 2023-03-14 LAB — CMV IGG: CMV IGG: POSITIVE — AB

## 2023-03-14 LAB — LACTATE DEHYDROGENASE: LACTATE DEHYDROGENASE: 140 U/L (ref 120–246)

## 2023-03-14 NOTE — Unmapped (Signed)
VENOUS ACCESS TEAM PROCEDURE    Nurse request was placed for a PIV by Venous Access Team (VAT).  Pt decided to reschedule appointment. ( Not due to waiting for IV )  Workup / Procedure Time:  15 minutes       care RN was notified.       Thank you,     Suezanne Jacquet, RN Venous Access Team

## 2023-03-15 DIAGNOSIS — Z796 Long-term use of immunosuppressant medication: Principal | ICD-10-CM

## 2023-03-15 DIAGNOSIS — Z944 Liver transplant status: Principal | ICD-10-CM

## 2023-03-15 LAB — TB MITOGEN: TB MITOGEN VALUE: 8.96

## 2023-03-15 LAB — TB AG1: TB AG1 VALUE: 0.12

## 2023-03-15 LAB — QUANTIFERON TB GOLD PLUS
QUANTIFERON ANTIGEN 1 MINUS NIL: 0 [IU]/mL
QUANTIFERON ANTIGEN 2 MINUS NIL: 0 [IU]/mL
QUANTIFERON MITOGEN: 8.84 [IU]/mL
QUANTIFERON TB GOLD PLUS: NEGATIVE
QUANTIFERON TB NIL VALUE: 0.12 [IU]/mL

## 2023-03-15 LAB — TB NIL: TB NIL VALUE: 0.12

## 2023-03-15 LAB — TB AG2: TB AG2 VALUE: 0.12

## 2023-03-15 MED ORDER — CYCLOSPORINE MODIFIED 25 MG CAPSULE
ORAL_CAPSULE | Freq: Two times a day (BID) | ORAL | 3 refills | 90 days | Status: CP
Start: 2023-03-15 — End: 2024-03-14
  Filled 2023-03-27: qty 360, 90d supply, fill #0

## 2023-03-15 NOTE — Unmapped (Signed)
Pt request for RX Refill

## 2023-03-16 NOTE — Unmapped (Signed)
Patient called in to discuss scheduling evaluation appointments. Discussed, dialysis days. Advised that once scheduled, an appointment letter will be sent. Confirmed address.

## 2023-03-17 NOTE — Unmapped (Signed)
-----   Message from Edrick Kins, MD sent at 03/16/2023  5:28 PM EDT -----  Acceptable repeat in 2 years  ----- Message -----  From: Marcine Matar, RN  Sent: 03/15/2023   9:53 AM EDT  To: Edrick Kins, MD    Please review for targets and follow up. thanks

## 2023-03-21 NOTE — Unmapped (Signed)
[facility] Clinical Assessment & Refill Coordination Note    Katherine Norton, [place]: 08/13/1956  Phone: [phone] (home)     All above HIPAA information was verified with patient.     Was a Nurse, learning disability used for this call? No    Specialty Medication(s):   Transplant: cyclosporine 25mg      Current Outpatient Medications   Medication Sig Dispense Refill    acetaminophen (TYLENOL) 325 MG tablet 2 tablets (650 mg total) by G-tube route every six (6) hours as needed. (Patient taking differently: Take 2 tablets (650 mg total) by mouth every six (6) hours as needed (headaches).)  0    albuterol 2.5 mg/0.5 mL nebulizer solution Inhale 0.5 mL (2.5 mg total) by nebulization every six (6) hours as needed for wheezing or shortness of breath. 30 each 12    allopurinoL (ZYLOPRIM) 100 MG tablet Take 1 tablet (100 mg total) by mouth daily as needed.      amLODIPine (NORVASC) 5 MG tablet Take 1 tablet (5 mg total) by mouth in the morning.      calcitonin, salmon, (MIACALCIN) 200 unit/actuation nasal spray 1 spray into alternating nostrils in the morning.      celecoxib (CELEBREX) 100 MG capsule TAKE 1 (ONE) CAPSULE BY MOUTH TWICE A DAY, MAX DAILY DOSE  2 CAPSULE      cetirizine (ZYRTEC) 5 MG tablet TAKE 1 (ONE) TABLET BY MOUTH DAILY, MAX DAILY DOSE: 1 TABLET      cycloSPORINE modified (NEORAL) 25 MG capsule Take 2 capsules (50 mg total) by mouth two (2) times a day. 360 capsule 3    dextrose (D10W) 10% Soln bolus Infuse 125 mL into a venous catheter every thirty (30) minutes as needed.  0    dextrose 5 % and sodium chloride 0.9 % (D5-NS) infu Infuse 25 mL/hr into a venous catheter continuous as needed (if tube feed held or NPO).  0    famotidine (PEPCID) 20 MG tablet 1 tablet (20 mg total) by G-tube route daily. (Patient taking differently: 20 mg  in the morning.) 30 tablet 0    fluticasone propionate (FLONASE) 50 mcg/actuation nasal spray 1 spray into each nostril.      food supplemt, lactose-reduced (ENSURE) Liqd Take 120 mL by mouth.      gabapentin (NEURONTIN) 100 MG capsule Take 1 capsule (100 mg total) by mouth daily as needed.      gentamicin 1 mg/mL, sodium citrate 4% Gent locks with dialysis 2.4 mL 4    heparin sodium,porcine (HEPARIN, PORCINE,) 1,000 unit/mL 1000 unit/mL injection This heparin is for dialysis use ONLY. RN if you are unable to aspirate from both lumes, call provider piror to using the catheter. 1 mL 0    heparin sodium,porcine (HEPARIN, PORCINE,) 5,000 unit/mL injection Inject 1 mL (5,000 Units total) under the skin every eight (8) hours. 1 mL 0    levETIRAcetam (KEPPRA) 500 mg/100 mL PgBk 1 tablet.      lipase/protease/amylase (PANCRELIPASE 16,000 ORAL) Pancrelipase 24000-76000      loratadine (CLARITIN) 10 mg tablet Take 1 tablet (10 mg total) by mouth daily as needed.      melatonin 3 mg Tab Take 1 tablet (3 mg total) by mouth nightly.  0    metoprolol succinate (TOPROL-XL) 50 MG 24 hr tablet 1 tablet (50 mg total) daily.      ondansetron (ZOFRAN) 4 MG tablet TAKE 1 TABLET BY MOUTH EVERY EIGHT HOURS AS NEEDED NAUSEA/VOMITING  oxyCODONE (ROXICODONE) 5 MG immediate release tablet 1 tablet (5 mg total) by G-tube route every six (6) hours as needed. for up to 5 days (Patient taking differently: Take 5 mg by mouth every six (6) hours as needed.) 10 tablet 0    vitamin B complx-C-FA-zinc cit (DIALYVITE 800 WITH ZINC 15) 0.8-15 mg Tab Take by mouth.      vitamin E, dl, acetate, 54.0 mg (50 unit)/mL Drop Take 1 capsule by mouth.       No current facility-administered medications for this visit.        Changes to medications: Raniesha reports no changes at this time.    Allergies   Allergen Reactions    Iodinated Contrast Media     Ioxaglate Sodium     Latex     Succinylcholine Chloride      Other reaction(s): Other (See Comments)       Changes to allergies: No    SPECIALTY MEDICATION ADHERENCE     Cyclosporine 25mg   : 10 days of medicine on hand       Medication Adherence Patient reported X missed doses in the last month: 0  Specialty Medication: cyclosporine 25mg   Patient is on additional specialty medications: No          Specialty medication(s) dose(s) confirmed: Regimen is correct and unchanged.     Are there any concerns with adherence? No    Adherence counseling provided? Not needed    CLINICAL MANAGEMENT AND INTERVENTION      Clinical Benefit Assessment:    Do you feel the medicine is effective or helping your condition? Yes    Clinical Benefit counseling provided? Not needed    Adverse Effects Assessment:    Are you experiencing any side effects? No    Are you experiencing difficulty administering your medicine? No    Quality of Life Assessment:    Quality of Life    Rheumatology  Oncology  Dermatology  Cystic Fibrosis          How many days over the past month did your transplant  keep you from your normal activities? For example, brushing your teeth or getting up in the morning. 0    Have you discussed this with your provider? Not needed    Acute Infection Status:    Acute infections noted within Epic:  No active infections  Patient reported infection: None    Therapy Appropriateness:    Is therapy appropriate and patient progressing towards therapeutic goals? Yes, therapy is appropriate and should be continued    DISEASE/MEDICATION-SPECIFIC INFORMATION      N/A    Solid Organ Transplant: Not Applicable    PATIENT SPECIFIC NEEDS     Does the patient have any physical, cognitive, or cultural barriers? No    Is the patient high risk? No    Did the patient require a clinical intervention? No    Does the patient require physician intervention or other additional services (i.e., nutrition, smoking cessation, social work)? No    SOCIAL DETERMINANTS OF HEALTH     At the [facility], we have learned that life circumstances - like trouble affording food, housing, utilities, or transportation can affect the health of many of our patients.   That is why we wanted to ask: are you currently experiencing any life circumstances that are negatively impacting your health and/or quality of life? No    Social Determinants of Health     Financial Resource Strain: Not on  file   Internet Connectivity: Not on file   Food Insecurity: Not on file   Tobacco Use: Unknown (12/25/2022)    Received from [facility]    Patient History   Housing/Utilities: Not on file   Alcohol Use: Not on file   Transportation Needs: Not on file   Substance Use: Not on file   Health Literacy: Not on file   Physical Activity: Not on file   Interpersonal Safety: Not on file   Stress: Not on file   Intimate Partner Violence: Not on file   Depression: Not at risk (04/29/2021)    PHQ-2     PHQ-2 Score: 0   Social Connections: Not on file       Would you be willing to receive help with any of the needs that you have identified today? Not applicable       SHIPPING     Specialty Medication(s) to be Shipped:   Transplant: cyclosporine 25mg     Other medication(s) to be shipped: No additional medications requested for fill at this time     Changes to insurance: No    Delivery Scheduled: Yes, Expected medication delivery date: 03/28/2023.     Medication will be delivered via UPS to the confirmed prescription address in [facility].    The patient will receive a drug information handout for each medication shipped and additional FDA Medication Guides as required.  Verified that patient has previously received a Conservation officer, historic buildings and a Surveyor, mining.    The patient or caregiver noted above participated in the development of this care plan and knows that they can request review of or adjustments to the care plan at any time.      All of the patient's questions and concerns have been addressed.    Thad Ranger, PharmD   [facility] Specialty Pharmacist

## 2023-03-22 LAB — HLA ANTIBODY SCREEN C1: HLA C1 AB SCR: NEGATIVE

## 2023-03-22 LAB — HLA ANTIBODY SCREEN C2: HLA C2 AB SCR: NEGATIVE

## 2023-03-22 NOTE — Unmapped (Signed)
Attempted to call patient about outstanding colonoscopy and mammogram. Unable to leave voicemail.

## 2023-03-23 ENCOUNTER — Other Ambulatory Visit (HOSPITAL_COMMUNITY): Payer: Self-pay

## 2023-04-06 ENCOUNTER — Ambulatory Visit
Admit: 2023-04-06 | Discharge: 2023-04-07 | Payer: MEDICARE | Attending: Student in an Organized Health Care Education/Training Program | Primary: Student in an Organized Health Care Education/Training Program

## 2023-04-06 DIAGNOSIS — Z01818 Encounter for other preprocedural examination: Principal | ICD-10-CM

## 2023-04-06 NOTE — Unmapped (Signed)
Transplant Surgery History and Physical    Assessment/Recommendations:  Katherine Norton is a 67 y.o. female seen in consultation at the request of Trevor Iha, DO for evaluation for candidacy for renal transplantation.     I spent 30 minutes with the patient obtaining the above history and physical examination, and greater than 50% of the time was spent on counseling and the substance of the discussion.    Today we discussed renal transplantation going over the surgery, the hospital course including length of stay, anti-rejection medications and their side effects, results and the cadaveric donor system and living donor options.    In regards to the surgical procedure, this is a major operation performed under general anesthesia with the risks of heart attack, stroke and death.  Multiple invasive means of monitoring may be necessary during the operation including an arterial line, a central venous catheter, a foley catheter inserted into the bladder, and a tube from your nose into your stomach to prevent stomach distension. After surgery, the patient usually goes to a monitored unit and is then sent to the regular ward. The incisional area is subject to the risk of infection and hernia and this risk is increased by obesity, diabetes and the immunosuppressive medications. Numbness can occur over the area of any surgical incision. The less common surgical complications (less than 5%) include blood clots in the legs or pelvis that may travel to the lungs, blood vessel infection or clotting, bleeding, urinary leaks, and lymphocele. Urgent/emergent re-operation may be required after a transplant for any of the above complications. One important point following surgery is that the hospital course can be prolonged, and dialysis may be necessary for some time, if the kidney does not function immediately (ATN). Less than 5% of the time, a transplanted kidney may never function or may function for only a short period of time.    Anti-rejection medications, including FK506 (Prograf) or Cyclosporine (Neoral), Cellcept, steroids and others, will be needed as long as the kidney is viable. Problems include infection, cancer, hirsutism, tremors, gum swelling, hypertension, bone fractures, aggravation of diabetes or new onset diabetes, cataracts, and rashes.    The donor system was reviewed. Although all donors are tested for all known infections, there is a persisting chance of transmission of viruses or other infections as well as possible transmission of tumors. We reviewed the advantages and disadvantages of living and deceased donor renal transplantation. We also reviewed deceased donation after cardiac death and extended criteria donors. We also discussed crossmatch tests and their inability to predict some delayed antibody-mediated rejection episodes.    Finally, I reviewed with the patient how the surgery is expected to improve their health and quality of life, that the average length of hospitalization stay is 3-5 days, and that the length of their expected recovery period, including when normal daily activities may be resumed, will be patient dependent.    [name]  had all questions answered and was urged to call us at any time if additional questions should arise.    This patient was seen and evaluated with Dr. Edwin Dada.  Repeat CT abdomen pelvis demonstrated diffuse atherosclerotic disease involving the abdominal aorta and common iliacs, however, minimal calcified atherosclerotic disease in the external iliacs (suitable).  However, given atherosclerotic burden and lack of lower extremity imaging, we will plan to obtain bilateral lower extremity arterial duplex to evaluate for peripheral arterial disease.  Clearance for transplant from a surgical perspective pending:  Obtaining bilateral lower  extremity arterial duplex to assess peripheral burden  Completion of cardiac evaluation      HPI:  Katherine Norton is a 67 y.o. female who presents for evaluation for kidney transplant.  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  She denies any history of cardiovascular disease.  She reports adequate functional capacity but does ambulate with the assistance of a cane.  She denies any shortness of breath when ambulating.  She is able to complete all activities of daily living independently.  She denies any lower extremity claudication symptoms or any known history of PAD.  She currently takes cyclosporine for immunosuppression, which she has taken since the time of transplant.  She denies any changes to her immunosuppression regimen.    Cause of kidney disease: CNA toxicity  On dialysis?:  Yes, HD through a left upper extremity AV fistula  Prior transplants: No prior renal transplants  Prior blood product transfusions: Yes, multiple prior transfusions  Kidney stones: Denies  Frequent UTI: Denies  History of heart disease: Denies  Peripheral arterial disease: Denies any known history of PAD     Allergies  Iodinated contrast media, Ioxaglate sodium, Latex, and Succinylcholine chloride      Medications    Current Outpatient Medications   Medication Sig Dispense Refill    acetaminophen (TYLENOL) 325 MG tablet 2 tablets (650 mg total) by G-tube route every six (6) hours as needed. (Patient taking differently: Take 2 tablets (650 mg total) by mouth every six (6) hours as needed (headaches).)  0    albuterol 2.5 mg/0.5 mL nebulizer solution Inhale 0.5 mL (2.5 mg total) by nebulization every six (6) hours as needed for wheezing or shortness of breath. 30 each 12    allopurinoL (ZYLOPRIM) 100 MG tablet Take 1 tablet (100 mg total) by mouth daily as needed.      amLODIPine (NORVASC) 5 MG tablet Take 1 tablet (5 mg total) by mouth in the morning.      calcitonin, salmon, (MIACALCIN) 200 unit/actuation nasal spray 1 spray into alternating nostrils in the morning.      celecoxib (CELEBREX) 100 MG capsule TAKE 1 (ONE) CAPSULE BY MOUTH TWICE A DAY, MAX DAILY DOSE  2 CAPSULE      cetirizine (ZYRTEC) 5 MG tablet TAKE 1 (ONE) TABLET BY MOUTH DAILY, MAX DAILY DOSE: 1 TABLET      famotidine (PEPCID) 20 MG tablet 1 tablet (20 mg total) by G-tube route daily. (Patient taking differently: 1 tablet (20 mg total) daily.) 30 tablet 0    fluticasone propionate (FLONASE) 50 mcg/actuation nasal spray 1 spray into each nostril.      food supplemt, lactose-reduced (ENSURE) Liqd Take 120 mL by mouth.      gabapentin (NEURONTIN) 100 MG capsule Take 1 capsule (100 mg total) by mouth daily as needed.      gentamicin 1 mg/mL, sodium citrate 4% Gent locks with dialysis 2.4 mL 4    heparin sodium,porcine (HEPARIN, PORCINE,) 1,000 unit/mL 1000 unit/mL injection This heparin is for dialysis use ONLY. RN if you are unable to aspirate from both lumes, call provider piror to using the catheter. 1 mL 0    heparin sodium,porcine (HEPARIN, PORCINE,) 5,000 unit/mL injection Inject 1 mL (5,000 Units total) under the skin every eight (8) hours. 1 mL 0  levETIRAcetam (KEPPRA) 500 mg/100 mL PgBk 1 tablet.      lipase/protease/amylase (PANCRELIPASE 16,000 ORAL) Pancrelipase 24000-76000      loratadine (CLARITIN) 10 mg tablet Take 1 tablet (10 mg total) by mouth daily as needed.      melatonin 3 mg Tab Take 1 tablet (3 mg total) by mouth nightly.  0    metoprolol succinate (TOPROL-XL) 50 MG 24 hr tablet 1 tablet (50 mg total) daily.      ondansetron (ZOFRAN) 4 MG tablet TAKE 1 TABLET BY MOUTH EVERY EIGHT HOURS AS NEEDED NAUSEA/VOMITING      vitamin B complx-C-FA-zinc cit (DIALYVITE 800 WITH ZINC 15) 0.8-15 mg Tab Take by mouth.      vitamin E, dl, acetate, 16.1 mg (50 unit)/mL Drop Take 1 capsule by mouth.      oxyCODONE (ROXICODONE) 5 MG immediate release tablet 1 tablet (5 mg total) by G-tube route every six (6) hours as needed. for up to 5 days (Patient taking differently: Take 5 mg by mouth every six (6) hours as needed.) 10 tablet 0     No current facility-administered medications for this visit.         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      Social History:  Tobacco use: denies  Alcohol use: denies  Drug use: denies      Review of Systems  A 12 system review of systems was negative except as noted in HPI    PE: Blood pressure 97/65, pulse 71, temperature 35.8 ??C (96.4 ??F), temperature source Tympanic, weight 52 kg (114 lb 9.6 oz), SpO2 100%. Body mass index is 22.38 kg/m??.  General: Sitting upright in chair, no apparent distress  Lungs: clear to auscultation, percussion to the bases, and unlabored breathing   Heart: euvolemic, regular rate and rhythm, normal S1 and S2, no murmur  Abd: Soft, nontender, nondistended.  Prior surgical incision is well-healed.  No palpable masses or hernias.  Pulses: Palpable femoral pulses.  Left femoral pulse faintly palpable compared to right.  Skin: no rashes, jaundice or skin lesions noted  Ext: no edema, well perfused  Neuro: Alert and oriented    Test Results  Lab Results   Component Value Date    WBC 2.5 (L) 03/14/2023    HGB 11.6 03/14/2023    HCT 34.3 03/14/2023    PLT 132 (L) 03/14/2023     Lab Results   Component Value Date NA 139 03/14/2023    K 3.8 03/14/2023    CL 97 (L) 03/14/2023    CO2 31.0 03/14/2023    BUN 25 (H) 03/14/2023    CREATININE 5.15 (H) 03/14/2023  CALCIUM 9.2 03/14/2023    MG 2.1 05/12/2021    PHOS 6.2 (A) 05/12/2021     Lab Results   Component Value Date    ALKPHOS 103 03/14/2023    BILITOT 0.4 03/14/2023    BILIDIR 0.20 03/14/2023    PROT 8.9 (H) 03/14/2023    ALBUMIN 3.3 (L) 03/14/2023    ALT <7 (L) 03/14/2023    AST 16 03/14/2023    GGT 17 03/14/2023     Lab Results   Component Value Date    INR 1.05 05/12/2022    APTT 28.0 05/12/2022       Imaging: No results found.

## 2023-04-12 DIAGNOSIS — Z796 Long-term use of immunosuppressant medication: Principal | ICD-10-CM

## 2023-04-12 DIAGNOSIS — Z944 Liver transplant status: Principal | ICD-10-CM

## 2023-04-12 MED ORDER — CYCLOSPORINE MODIFIED 25 MG CAPSULE
ORAL_CAPSULE | Freq: Two times a day (BID) | ORAL | 3 refills | 90 days | Status: CP
Start: 2023-04-12 — End: 2024-04-11
  Filled 2023-06-28: qty 360, 90d supply, fill #0

## 2023-04-12 NOTE — Unmapped (Signed)
Received a message from Valerie Salts, PharmD, with [facility]. She reported that the pt's cyclosporine was discontinued by the RN during the kidney evaluation on 04/06/23.The RN indicated that the the pt was no longer taking the cyclosporine. A new prescription will need to be resent to [facility] if the pt is to be taking the medication.    Reviewed with Vallery Sa ANP who stated that this was an error and the cyclosporine should not have been discontinued.    A new prescription for the cyclosporine was sent to [facility].

## 2023-04-12 NOTE — Unmapped (Signed)
A call was placed to the pt to confirm that she was continuing to take the cyclosporine but she was not available. Mailbox was full so no voicemail message could be left. TNC to follow up again later.     A second call was placed to the pt but there was no answer. Voicemail box remained full so no messages could be left.

## 2023-04-13 NOTE — Unmapped (Signed)
A call was placed to the pt and  she confirmed that she continues to take her cyclosporine 2 capsules BID. Pt also stated that she has her blood work done while at dialysis. Pt confirmed that she goes to [facility] on [address] in [place], [place].    A call was placed to [facility] in [place], [place] [phone]) but no one answered the phone. A voicemail message was left requesting a return call to discuss blood work that is being drawn for the pt at their facility.

## 2023-04-13 NOTE — Unmapped (Signed)
Attempted to contact. VM full. Patient has outstanding testing needed for evaluation and all health maintenance is overdue. Patient given 6 months to complete HM or case will be closed. Letter sent.

## 2023-04-14 NOTE — Unmapped (Signed)
A call was placed to [facility] [place]. TNC spoke with Amy who stated that the pt is having monthly lab work drawn with occasional labs drawn during each dialysis session. Amy agreed to fax lab results from 04/12/23 to the TNC. Provided the fax number: [phone].    A call was placed to the pt and an update was provided that the TNC had spoken with Amy at [facility]. Advised that Amy had confirmed the pt had lab work done on 04/12/23 and will fax the lab results to liver transplant. Will update the pt regarding the lab results once they have been received. Pt verbalized her understanding.

## 2023-04-17 DIAGNOSIS — Z944 Liver transplant status: Principal | ICD-10-CM

## 2023-04-17 DIAGNOSIS — Z796 Long-term use of immunosuppressant medication: Principal | ICD-10-CM

## 2023-04-17 NOTE — Unmapped (Addendum)
A call was placed to [facility] [place] [phone]. Spoke with Victorino Dike who stated that a cyclosporine level had not been drawn for the pt. She stated that new orders would need to be faxed. She confirmed the fax number as: [phone].    A letter with standing lab orders was faxed to [facility] [place] using the fax number provided: [phone].    A call was placed to the pt and the lab results from 04/12/23 were reviewed. Informed the pt that liver enzymes were stable but dialysis center had not drawn a cyclosporine level. Pt will have her blood work drawn again when she attends her appointment with Vallery Sa ANP on 05/11/23. Also informed the pt that new standing lab orders had been sent to her [facility]. Pt verbalized her understanding of all that was discussed.     Lab orders were placed for the pt's upcoming appointment with [facility] ANP on 05/11/2023.

## 2023-04-17 NOTE — Unmapped (Signed)
6/24 - Received call from [facility] to discuss faxed standing lab order per call kidney care states that all lab work for pt cannot be complete directly with them and this TPA informed TNC Springer by phone that the kidney care would not be able to complete lab work for liver numbers.

## 2023-04-17 NOTE — Unmapped (Signed)
Attempted to contact this patient to discuss rescheduling Kidney Txp.  LVM

## 2023-04-18 ENCOUNTER — Ambulatory Visit: Admit: 2023-04-18 | Discharge: 2023-04-19 | Payer: MEDICARE

## 2023-04-19 NOTE — Unmapped (Signed)
Patient called for eval update. Stated she will have her colonoscopy faxed to me ASAP. Reports that she had a total hysterectomy in 1983/84. Reports that her mammogram needs to be scheduled but she will do this ASAP as well. Patient informed that these are needed to complete kidney eval. Has 6 months to send results of HM, pt aware.

## 2023-04-28 DIAGNOSIS — Z944 Liver transplant status: Principal | ICD-10-CM

## 2023-04-28 DIAGNOSIS — Z796 Long-term use of immunosuppressant medication: Principal | ICD-10-CM

## 2023-04-28 NOTE — Unmapped (Signed)
A call was placed to the pt but she was not available. An update was left on voicemail that the [facility] had received a message from her dialysis center that they will not be able to draw all of the labs, including the cyclosporine level, needed for liver transplant. Pt was requested to start getting her labs again from [facility], as she had previously. Informed the pt that she can get her next set of labs drawn at her upcoming appointment with Vallery Sa ANP on 05/11/23. Encouraged the patient to call with any questions.     Standing lab orders placed for [facility].

## 2023-05-01 NOTE — Unmapped (Signed)
Colonoscopy uploaded in media.

## 2023-05-01 NOTE — Unmapped (Signed)
Called and spoke to patient and scheduled Kidney Txp [facility] for 05/11/2023.  Discussed same day appointments at [facility] and [facility]L will be at [facility].    Letter sent via mail.

## 2023-05-03 NOTE — Unmapped (Signed)
A call was placed to the pt to complete the prep for her upcoming appointment with Vallery Sa on 05/11/23 at 8:20 AM, but she was not available. A message was left on voicemail requesting a return call.

## 2023-05-04 NOTE — Unmapped (Signed)
Post Liver Transplant Follow-Up    Transplant Date: 05/15/1995 (Liver)    Called pt to prep for virtual post liver txp follow up scheduled with Vallery Sa on 718/24. Pt is currently retired. Pt reported no medical issues in the last year. She continues with her dialysis on Mondays, Wednesdays, and Fridays.Pt has an established PCP Laurence Aly PA at [facility] in [place], [place]. Pt has not seen a dermatologist but stated that her PCP is working on a referral for her. Pt reported itching and stated that she had been getting shots for this at her dialysis center but they stopped the shots a few months ago. Pt was encouraged to discuss again with her dialysis center or PCP. Pt reported that she has blood work drawn weekly at her dialysis center. Informed the pt that TNC spoke with the dialysis center and they will not be able to draw all labs needed for liver transplant, including the cyclosporine level. Pt was agreeable to going back to [facility] for her lab work. IS refilled via [facility] pharmacy. Medication list reconciled and reviewed. Reviewed vaccines. Pt stated that it's been a while since she had a tetanus shot but could not remember when it was last done. She stated that she was unsure if she wanted another Covid shot. Screenings are up to date. Pt had a colonoscopy in 05/2020 and is not due again until 2026. Pt is in the process of trying to get a mammogram scheduled through her PCP. Pt denied EtOH, tobacco, or recreational drug use. Provided patient education reviewing the following: preventative care, vaccinations, and over the counter medications. Pt verbalized understanding of all topics discussed.

## 2023-05-09 ENCOUNTER — Telehealth (HOSPITAL_BASED_OUTPATIENT_CLINIC_OR_DEPARTMENT_OTHER): Payer: Self-pay

## 2023-05-09 ENCOUNTER — Other Ambulatory Visit (HOSPITAL_BASED_OUTPATIENT_CLINIC_OR_DEPARTMENT_OTHER): Payer: Self-pay | Admitting: Nephrology

## 2023-05-09 DIAGNOSIS — Z01818 Encounter for other preprocedural examination: Secondary | ICD-10-CM

## 2023-05-11 ENCOUNTER — Ambulatory Visit: Admit: 2023-05-11 | Payer: MEDICARE

## 2023-05-11 ENCOUNTER — Ambulatory Visit: Admit: 2023-05-11 | Discharge: 2023-05-11 | Payer: MEDICARE

## 2023-05-11 DIAGNOSIS — Z944 Liver transplant status: Principal | ICD-10-CM

## 2023-05-11 DIAGNOSIS — Z796 Long-term use of immunosuppressant medication: Principal | ICD-10-CM

## 2023-05-11 DIAGNOSIS — Z01818 Encounter for other preprocedural examination: Principal | ICD-10-CM

## 2023-05-11 DIAGNOSIS — Z01810 Encounter for preprocedural cardiovascular examination: Principal | ICD-10-CM

## 2023-05-11 DIAGNOSIS — N186 End stage renal disease: Principal | ICD-10-CM

## 2023-05-11 LAB — COMPREHENSIVE METABOLIC PANEL
ALBUMIN: 3.5 g/dL (ref 3.4–5.0)
ALKALINE PHOSPHATASE: 106 U/L (ref 46–116)
ALT (SGPT): <7 U/L — ABNORMAL LOW (ref 10–49)
ANION GAP: 9 mmol/L (ref 5–14)
AST (SGOT): 21 U/L (ref <=34)
BILIRUBIN TOTAL: 0.3 mg/dL (ref 0.3–1.2)
BLOOD UREA NITROGEN: 23 mg/dL (ref 9–23)
BUN / CREAT RATIO: 4
CALCIUM: 10 mg/dL (ref 8.7–10.4)
CHLORIDE: 96 mmol/L — ABNORMAL LOW (ref 98–107)
CO2: 30.5 mmol/L (ref 20.0–31.0)
CREATININE: 6.4 mg/dL — ABNORMAL HIGH
EGFR CKD-EPI (2021) FEMALE: 7 mL/min/{1.73_m2} — ABNORMAL LOW (ref >=60)
GLUCOSE RANDOM: 94 mg/dL (ref 70–99)
POTASSIUM: 4.1 mmol/L (ref 3.4–4.8)
PROTEIN TOTAL: 9.2 g/dL — ABNORMAL HIGH (ref 5.7–8.2)
SODIUM: 135 mmol/L (ref 135–145)

## 2023-05-11 LAB — CBC W/ AUTO DIFF
BASOPHILS ABSOLUTE COUNT: 0 10*9/L (ref 0.0–0.1)
BASOPHILS RELATIVE PERCENT: 0.8 %
EOSINOPHILS ABSOLUTE COUNT: 0.1 10*9/L (ref 0.0–0.5)
EOSINOPHILS RELATIVE PERCENT: 3 %
HEMATOCRIT: 36.1 % (ref 34.0–44.0)
HEMOGLOBIN: 12 g/dL (ref 11.3–14.9)
LYMPHOCYTES ABSOLUTE COUNT: 0.9 10*9/L — ABNORMAL LOW (ref 1.1–3.6)
LYMPHOCYTES RELATIVE PERCENT: 33.7 %
MEAN CORPUSCULAR HEMOGLOBIN CONC: 33.3 g/dL (ref 32.0–36.0)
MEAN CORPUSCULAR HEMOGLOBIN: 31.1 pg (ref 25.9–32.4)
MEAN CORPUSCULAR VOLUME: 93.5 fL (ref 77.6–95.7)
MEAN PLATELET VOLUME: 8.4 fL (ref 6.8–10.7)
MONOCYTES ABSOLUTE COUNT: 0.4 10*9/L (ref 0.3–0.8)
MONOCYTES RELATIVE PERCENT: 14.5 %
NEUTROPHILS ABSOLUTE COUNT: 1.3 10*9/L — ABNORMAL LOW (ref 1.8–7.8)
NEUTROPHILS RELATIVE PERCENT: 48 %
PLATELET COUNT: 153 10*9/L (ref 150–450)
RED BLOOD CELL COUNT: 3.86 10*12/L — ABNORMAL LOW (ref 3.95–5.13)
RED CELL DISTRIBUTION WIDTH: 14.6 % (ref 12.2–15.2)
WBC ADJUSTED: 2.7 10*9/L — ABNORMAL LOW (ref 3.6–11.2)

## 2023-05-11 LAB — PHOSPHORUS: PHOSPHORUS: 7.6 mg/dL — ABNORMAL HIGH (ref 2.4–5.1)

## 2023-05-11 LAB — BILIRUBIN, DIRECT: BILIRUBIN DIRECT: 0.1 mg/dL (ref 0.00–0.30)

## 2023-05-11 LAB — MAGNESIUM: MAGNESIUM: 1.9 mg/dL (ref 1.6–2.6)

## 2023-05-11 LAB — GAMMA GT: GAMMA GLUTAMYL TRANSFERASE: 22 U/L

## 2023-05-11 LAB — CYCLOSPORINE LEVEL: CYCLOSPORINE (FPIA) BLOOD: 59 ng/mL

## 2023-05-11 NOTE — Unmapped (Signed)
A call was placed to the pt and  lab results from today were reviewed. Informed the pt that her liver enzymes are stable and cyclosporine level was 59, within goal of 40-80. Creatinine and phosphorus are elevated but pt does dialysis 3 x weekly (M/W/F). Pt stated that she will go to get her lab work drawn again at [facility] in one month.     Request placed on checklist for TPA Mahalia Longest to schedule an annual return appointment for the pt with Vallery Sa ANP in July 2025.

## 2023-05-11 NOTE — Unmapped (Signed)
FOLLOW UP ANNUAL LIVER CLINIC NOTE     Patient Name: Katherine Norton  Medical Record Number: [id number]  Date of Service: 05/11/2023    Referring Physician: [facility]  Current complaint: Follow up Annual Liver    Assessment/Plan:     Katherine Norton is a 67 y.o. 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 Recent LFT testing/images WNL.  Immunosuppressive monotherapy goal (40-80). Continue neoral 50mg  BID.    Hoping to be find a donor kidney transplant.    Today we discussed nutrition at length as a means of preventing frailty.         HEALTH MAINTENANCE:   Immunization History   Administered Date(s) Administered    COVID-19 VAC,BIVALENT,MODERNA(BLUE CAP) 07/03/2021    COVID-19 VACCINE,MRNA(MODERNA)(PF) 12/24/2019, 01/21/2020, 09/11/2020, 03/13/2021    HEPATITIS B VACCINE ADULT, ADJUVANTED, IM(HEPLISAV B) 12/11/2020, 01/06/2021, 02/12/2021, 04/09/2021    HEPATITIS B VACCINE ADULT,IM(ENERGIX B, RECOMBIVAX) 08/24/2018, 09/24/2018, 10/26/2018, 02/22/2019, 05/22/2019, 06/21/2019, 07/19/2019, 11/22/2019    INFLUENZA QUAD HIGH DOSE 11YRS+(FLUZONE) 08/21/2020    Influenza Vaccine Quad(IM)6 MO-Adult(PF) 07/06/2017, 07/26/2019    Influenza Virus Vaccine, unspecified formulation 07/01/2015, 08/22/2016, 07/06/2017, 07/26/2019, 08/07/2021    PNEUMOCOCCAL POLYSACCHARIDE 23-VALENT 08/22/2018, 09/03/2018    Pneumococcal Conjugate 13-Valent 07/06/2017    SHINGRIX-ZOSTER VACCINE (HZV),RECOMBINANT,ADJUVANTED(IM) 01/15/2018, 05/14/2020   Shingrix vaccine series completed    Return to clinic: 1 year  Labs: Q 6-8 weeks    I personally spent 35 minutes face-to-face and non-face-to-face in the care of this patient, which includes all pre, intra, and post visit time on the date of service. Greater than 50% of the time was spent on counseling and the substance of the discussion.      Subjective:     HPI: Katherine Norton is a 67 y.o. 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 She returns today for her annual follow up. 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 She currently dialyizes via L upper arm AVF and doesn't make urine. She is tolerating HD well and has few changes to health over the last year but reports ongoing weight loss.    Denies fever, chills, arthralgias, and fatigue. Denies chest pain, SOB, N/V/D, constipation or abdominal pain. No acute complaint today.    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    PR TRACHEOSTOMY, PLANNED Midline 06/26/2018    Procedure: TRACHEOSTOMY PLANNED (SEPART PROC);  Surgeon: Theodosia Blender, MD;  Location: MAIN OR [facility];  Service: ENT    TONSILLECTOMY         Family History   Problem Relation Age of Onset    Hypertension Mother     Hypertension Father     Prostate cancer Father     Lupus Sister     Lupus Brother     Kidney disease Neg Hx        Social History     Socioeconomic History    Marital status: Single   Tobacco  Use    Smoking status: Never     Passive exposure: Never    Smokeless tobacco: Never   Substance and Sexual Activity    Alcohol use: No     Alcohol/week: 0.0 standard drinks of alcohol    Drug use: No       REVIEW OF SYSTEMS:   The balance of 10/12 systems is negative with the exception of HPI.    Objective:     MEDICATIONS:  Allergies   Allergen Reactions    Iodinated Contrast Media     Ioxaglate Sodium     Latex     Succinylcholine Chloride      Other reaction(s): Other (See Comments)       Current Outpatient Medications   Medication Sig Dispense Refill    acetaminophen (TYLENOL) 325 MG tablet 2 tablets (650 mg total) by G-tube route every six (6) hours as needed. (Patient taking differently: Take 2 tablets (650 mg total) by mouth every six (6) hours as needed (headaches).)  0    allopurinoL (ZYLOPRIM) 100 MG tablet Take 1 tablet (100 mg total) by mouth daily as needed.      amLODIPine (NORVASC) 5 MG tablet Take 1 tablet (5 mg total) by mouth in the morning.      calcitonin, salmon, (MIACALCIN) 200 unit/actuation nasal spray 1 spray into alternating nostrils in the morning.      cetirizine (ZYRTEC) 5 MG tablet TAKE 1 (ONE) TABLET BY MOUTH DAILY, MAX DAILY DOSE: 1 TABLET      cycloSPORINE modified (NEORAL) 25 MG capsule Take 2 capsules (50 mg total) by mouth two (2) times a day. 360 capsule 3    diclofenac sodium (VOLTAREN) 1 % gel Apply 2 g topically in the morning.      fluticasone propionate (FLONASE) 50 mcg/actuation nasal spray 1 spray into each nostril.      food supplemt, lactose-reduced (ENSURE) Liqd Take 120 mL by mouth.      gentamicin 1 mg/mL, sodium citrate 4% Gent locks with dialysis 2.4 mL 4    heparin sodium,porcine (HEPARIN, PORCINE,) 1,000 unit/mL 1000 unit/mL injection This heparin is for dialysis use ONLY. RN if you are unable to aspirate from both lumes, call provider piror to using the catheter. 1 mL 0    heparin sodium,porcine (HEPARIN, PORCINE,) 5,000 unit/mL injection Inject 1 mL (5,000 Units total) under the skin every eight (8) hours. 1 mL 0    levETIRAcetam (KEPPRA) 500 mg/100 mL PgBk 1 tablet.      lipase/protease/amylase (PANCRELIPASE 16,000 ORAL) Pancrelipase 24000-76000      loratadine (CLARITIN) 10 mg tablet Take 1 tablet (10 mg total) by mouth daily as needed.      metoprolol succinate (TOPROL-XL) 50 MG 24 hr tablet 1 tablet (50 mg total) daily.      ondansetron (ZOFRAN) 4 MG tablet TAKE 1 TABLET BY MOUTH EVERY EIGHT HOURS AS NEEDED NAUSEA/VOMITING      vitamin B complx-C-FA-zinc cit (DIALYVITE 800 WITH ZINC 15) 0.8-15 mg Tab Take by mouth. vitamin E, dl, acetate, 47.8 mg (50 unit)/mL Drop Take 1 capsule by mouth.      albuterol 2.5 mg/0.5 mL nebulizer solution Inhale 0.5 mL (2.5 mg total) by nebulization every six (6) hours as needed for wheezing or shortness of breath. 30 each 12    oxyCODONE (ROXICODONE) 5 MG immediate release tablet 1 tablet (5 mg total) by G-tube route every six (6) hours as needed. for up to 5 days (Patient taking differently: Take  5 mg by mouth every six (6) hours as needed.) 10 tablet 0     No current facility-administered medications for this visit.         PHYSICAL EXAM:  BP 94/62 (BP Site: R Arm, BP Position: Sitting, BP Cuff Size: Medium)  - Pulse 65  - Temp 35.9 ??C (96.6 ??F) (Temporal)  - Ht 152.4 cm (5')  - Wt 51.2 kg (112 lb 12.8 oz)  - SpO2 99%  - BMI 22.03 kg/m??       Wt Readings from Last 12 Encounters:   05/11/23 51.2 kg (112 lb 12.8 oz)   04/06/23 52 kg (114 lb 9.6 oz)   05/12/22 49.6 kg (109 lb 6.4 oz)   12/09/21 50 kg (110 lb 4.8 oz)   05/20/21 53.9 kg (118 lb 12.8 oz)   04/13/21 54.7 kg (120 lb 9.6 oz)   01/12/21 55.3 kg (122 lb)   07/16/20 52.2 kg (115 lb)   05/14/20 51.8 kg (114 lb 3.2 oz)   03/19/20 49.8 kg (109 lb 12.8 oz)   05/02/19 57.2 kg (126 lb)   06/26/18 86 kg (189 lb 9.5 oz)        General Appearance:  NAD, well appearing but thin   HEENT:  [place]/AT. Well hydrated moist mucous membranes of the oral cavity. No scleral icterus. No cervical lymphadenopathy.   Pulmonary:    Normal respiratory effort. CTAB, without wheezes/crackles/rhonchi. Good air movement.    Cardiovascular:  Regular rate and rhythm, no murmur noted.   Extremities No edema. Ecchymosis to LUE with swelling to LUE above AVF   Abdomen:   Normoactive bowel sounds, abdomen soft, non-tender and not distended, no Hepatosplenomegaly or masses. Abdominal scar well healed without hernia.    Musculoskeletal: No joint tenderness, full ROM. Normal gait.    Skin: Skin color, texture, turgor normal, no rashes or lesions.   Neurologic: Grossly intact. Psychiatric: Judgement and insight appropriate.        LAB RESULTS:  Results for orders placed or performed in visit on 05/11/23   Comprehensive Metabolic Panel   Result Value Ref Range    Sodium 135 135 - 145 mmol/L    Potassium 4.1 3.4 - 4.8 mmol/L    Chloride 96 (L) 98 - 107 mmol/L    CO2 30.5 20.0 - 31.0 mmol/L    Anion Gap 9 5 - 14 mmol/L    BUN 23 9 - 23 mg/dL    Creatinine 9.81 (H) 0.55 - 1.02 mg/dL    BUN/Creatinine Ratio 4     eGFR CKD-EPI (2021) Female 7 (L) >=60 mL/min/1.75m2    Glucose 94 70 - 99 mg/dL    Calcium 19.1 8.7 - 47.8 mg/dL    Albumin 3.5 3.4 - 5.0 g/dL    Total Protein 9.2 (H) 5.7 - 8.2 g/dL    Total Bilirubin 0.3 0.3 - 1.2 mg/dL    AST 21 <=29 U/L    ALT <7 (L) 10 - 49 U/L    Alkaline Phosphatase 106 46 - 116 U/L   Bilirubin, Direct   Result Value Ref Range    Bilirubin, Direct 0.10 0.00 - 0.30 mg/dL   Phosphorus Level   Result Value Ref Range    Phosphorus 7.6 (H) 2.4 - 5.1 mg/dL   Magnesium Level   Result Value Ref Range    Magnesium 1.9 1.6 - 2.6 mg/dL   CBC w/ Differential   Result Value Ref Range    WBC 2.7 (L) 3.6 -  11.2 10*9/L    RBC 3.86 (L) 3.95 - 5.13 10*12/L    HGB 12.0 11.3 - 14.9 g/dL    HCT 84.1 32.4 - 40.1 %    MCV 93.5 77.6 - 95.7 fL    MCH 31.1 25.9 - 32.4 pg    MCHC 33.3 32.0 - 36.0 g/dL    RDW 02.7 25.3 - 66.4 %    MPV 8.4 6.8 - 10.7 fL    Platelet 153 150 - 450 10*9/L    Neutrophils % 48.0 %    Lymphocytes % 33.7 %    Monocytes % 14.5 %    Eosinophils % 3.0 %    Basophils % 0.8 %    Absolute Neutrophils 1.3 (L) 1.8 - 7.8 10*9/L    Absolute Lymphocytes 0.9 (L) 1.1 - 3.6 10*9/L    Absolute Monocytes 0.4 0.3 - 0.8 10*9/L    Absolute Eosinophils 0.1 0.0 - 0.5 10*9/L    Absolute Basophils 0.0 0.0 - 0.1 10*9/L     *Note: Due to a large number of results and/or encounters for the requested time period, some results have not been displayed. A complete set of results can be found in Results Review.              IMAGING Personally reviewed   CT Abdomen Pelvis Wo Contrast  Result Date: 05/14/2020  -Extensive aortobiiliac calcifications. Mild calcifications of the distal external iliac vasculature. Scattered calcifications of the common femoral arteries and internal iliac bilaterally.   -Sequelae of liver transplantation. Diffuse pneumobilia.   -Small atrophic native kidneys compatible with chronic medical renal disease. Nonobstructing left nephrolithiasis.     XR Chest 2 views  Result Date: 05/14/2020  No acute findings.    US Liver Transplant  Result Date: 05/14/2020  --Patent hepatic transplant vasculature.   --Resistive indices within the common hepatic arteries are stable to slightly increased compared to prior, within normal limits. Recommend attention on follow-up.   --Decreased phasicity of the hepatic veins and IVC, findings may be related to venoocclusive disease however favor technical issues.   --The transplant liver is mildly enlarged and demonstrates increased echogenicity.

## 2023-05-12 ENCOUNTER — Encounter: Payer: Self-pay | Admitting: Hematology & Oncology

## 2023-05-22 DIAGNOSIS — Z944 Liver transplant status: Principal | ICD-10-CM

## 2023-05-22 DIAGNOSIS — Z796 Long-term use of immunosuppressant medication: Principal | ICD-10-CM

## 2023-05-25 ENCOUNTER — Ambulatory Visit (HOSPITAL_BASED_OUTPATIENT_CLINIC_OR_DEPARTMENT_OTHER)
Admission: RE | Admit: 2023-05-25 | Discharge: 2023-05-25 | Disposition: A | Discharge status: Home / Self Care | Payer: 59 | Source: Ambulatory Visit | Attending: Nephrology | Admitting: Nephrology

## 2023-05-25 ENCOUNTER — Encounter (HOSPITAL_BASED_OUTPATIENT_CLINIC_OR_DEPARTMENT_OTHER): Payer: Self-pay

## 2023-05-25 DIAGNOSIS — Z01818 Encounter for other preprocedural examination: Secondary | ICD-10-CM

## 2023-05-25 DIAGNOSIS — Z1231 Encounter for screening mammogram for malignant neoplasm of breast: Secondary | ICD-10-CM | POA: Diagnosis not present

## 2023-06-04 ENCOUNTER — Emergency Department (HOSPITAL_BASED_OUTPATIENT_CLINIC_OR_DEPARTMENT_OTHER)
Admission: EM | Admit: 2023-06-04 | Discharge: 2023-06-04 | Disposition: A | Discharge status: Home / Self Care | Payer: 59 | Attending: Emergency Medicine | Admitting: Emergency Medicine

## 2023-06-04 ENCOUNTER — Emergency Department (HOSPITAL_BASED_OUTPATIENT_CLINIC_OR_DEPARTMENT_OTHER): Payer: 59

## 2023-06-04 ENCOUNTER — Other Ambulatory Visit: Payer: Self-pay

## 2023-06-04 DIAGNOSIS — Z20822 Contact with and (suspected) exposure to covid-19: Secondary | ICD-10-CM | POA: Insufficient documentation

## 2023-06-04 DIAGNOSIS — R051 Acute cough: Secondary | ICD-10-CM

## 2023-06-04 DIAGNOSIS — R0981 Nasal congestion: Secondary | ICD-10-CM | POA: Insufficient documentation

## 2023-06-04 DIAGNOSIS — R519 Headache, unspecified: Secondary | ICD-10-CM | POA: Diagnosis not present

## 2023-06-04 DIAGNOSIS — R059 Cough, unspecified: Secondary | ICD-10-CM | POA: Diagnosis present

## 2023-06-04 LAB — RESP PANEL BY RT-PCR (RSV, FLU A&B, COVID)  RVPGX2
Influenza A by PCR: NEGATIVE
Influenza B by PCR: NEGATIVE
Resp Syncytial Virus by PCR: NEGATIVE
SARS Coronavirus 2 by RT PCR: NEGATIVE

## 2023-06-04 LAB — GROUP A STREP BY PCR: Group A Strep by PCR: NOT DETECTED

## 2023-06-04 NOTE — ED Provider Notes (Addendum)
[place] [facility] AT [facility] Provider Note   CSN: [id number] Arrival date & time: 06/04/23  0741     History  Chief Complaint  Patient presents with   Sore Throat    Tami [name] is a 67 y.o. female.   Sore Throat  67 year old female with history of prior liver transplant in 1996, ESRD on dialysis Monday Wednesday Friday presenting for cough, nasal congestion, mild headache.  She states for the last week she has had mild cough, nasal congestion.  Occasional frontal headache.  Headache was not sudden onset, similar to prior headaches.  Headache and congestion are improving, however cough today was slightly worse.  No hemoptysis, no production.  Has not had any fevers or chills.  She is been eating and drinking well, no vomiting or diarrhea.  No abdominal pain, chest pain.  No weakness or numbness.  She is here because she wants to make sure she does not have COVID or flu.  She states that dialysis shortly before the symptoms began she had a COVID exposure.  She received a full session of dialysis on Friday.  She does not make urine.     Home Medications Prior to Admission medications   Medication Sig Start Date End Date Taking? Authorizing Provider  acetaminophen (TYLENOL) 325 MG tablet Take 325 mg by mouth every 6 (six) hours as needed for mild pain or headache.    [provider]  albuterol (VENTOLIN HFA) 108 (90 Base) MCG/ACT inhaler Inhale 4 puffs into the lungs every 6 (six) hours as needed for wheezing or shortness of breath.    [provider]  allopurinol (ZYLOPRIM) 100 MG tablet Take 200 mg by mouth daily. 03/18/15   [provider]  B Complex-C-Zn-Folic Acid (DIALYVITE 800-ZINC 15) 0.8 MG TABS Take 1 tablet by mouth daily. 05/29/19   [provider]  cycloSPORINE modified (NEORAL) 25 MG capsule Take 50 mg by mouth 2 (two) times daily.     [provider]  ENSURE (ENSURE) Take 237 mLs by mouth daily.  07/30/18   [provider]  fluticasone (FLONASE) 50 MCG/ACT nasal spray Place 1 spray into both nostrils daily as needed for allergies or rhinitis. 02/13/19   [provider]  gabapentin (NEURONTIN) 100 MG capsule Take 200 mg by mouth 2 (two) times daily as needed (pain). 01/20/19   [provider]  levETIRAcetam (KEPPRA) 500 MG tablet Take 1 tablet (500 mg total) by mouth daily AND 1 tablet (500 mg total) 3 (three) times a week. Monday, Wednesday and Friday following dialysis. 03/02/23   Lomax, Amy, NP  lidocaine-prilocaine (EMLA) cream APPLY SMALL AMOUNT TO ACCESS SITE (AVF) 30 - 60 MINUTES BEFORE DIALYSIS. COVER WITH OCCLUSIVE DRESSING ( SARAN WRAP) 01/16/19   [provider]  loratadine (CLARITIN) 10 MG tablet Take 10 mg by mouth daily as needed for allergies. X 10 days 08/11/18 05/10/22  [provider]  losartan (COZAAR) 50 MG tablet Take 50 mg by mouth at bedtime.    [provider]  metoprolol succinate (TOPROL-XL) 25 MG 24 hr tablet Take 12.5 mg by mouth daily. 01/18/19   [provider]  ondansetron (ZOFRAN ODT) 4 MG disintegrating tablet Dissolve 1 tablet (4 mg total) by mouth every 8 (eight) hours as needed for nausea or vomiting. 06/16/21   Milagros Loll, MD  oxyCODONE (OXY IR/ROXICODONE) 5 MG immediate release tablet Take 5 mg by mouth every 6 (six) hours as needed for pain. 04/14/22  [provider]  Pancrelipase, Lip-Prot-Amyl, 24000-76000 units CPEP Take 1 capsule by mouth See admin instructions. Take 1 capsule with every meal and every snack. 10/10/18   [provider]  pantoprazole (PROTONIX) 40 MG tablet Take 40 mg by mouth in the morning and at bedtime. 10/10/18   [provider]  PARoxetine (PAXIL) 10 MG tablet Take 10 mg by mouth daily. 06/17/19   [provider]  progesterone (PROMETRIUM) 100 MG capsule Take 100 mg by mouth daily. 03/30/22   [provider]  sildenafil (REVATIO)  20 MG tablet Take 20 mg by mouth daily. 05/03/22   [provider]  sucroferric oxyhydroxide (VELPHORO) 500 MG chewable tablet Chew 500-1,000 mg by mouth See admin instructions. Take 2 tablets three times a day with each meal and 1 tablets with each snack.    [provider]  Vitamin D, Ergocalciferol, (DRISDOL) 1.25 MG (50000 UT) CAPS capsule Take 50,000 Units by mouth once a week. Sunday 11/06/18   [provider]      Allergies    Iodinated contrast media, Ioxaglate, Latex, and Quelicin  [succinylcholine chloride]    Review of Systems   Review of Systems Review of systems completed and notable as per HPI.  ROS otherwise negative.   Physical Exam Updated Vital Signs BP (!) 148/82 (BP Location: Right Arm)   Pulse 96   Temp 98 F (36.7 C) (Oral)   Resp 18   SpO2 98%  Physical Exam Vitals and nursing note reviewed.  Constitutional:      General: She is not in acute distress.    Appearance: She is well-developed.  HENT:     Head: Normocephalic and atraumatic.     Nose: No congestion or rhinorrhea.     Mouth/Throat:     Mouth: Mucous membranes are moist.     Pharynx: Oropharynx is clear. No pharyngeal swelling, oropharyngeal exudate or posterior oropharyngeal erythema.     Tonsils: No tonsillar exudate or tonsillar abscesses.  Eyes:     General: No scleral icterus.    Conjunctiva/sclera: Conjunctivae normal.     Pupils: Pupils are equal, round, and reactive to light.  Cardiovascular:     Rate and Rhythm: Normal rate and regular rhythm.     Heart sounds: No murmur heard. Pulmonary:     Effort: Pulmonary effort is normal. No respiratory distress.     Breath sounds: Normal breath sounds.  Abdominal:     Palpations: Abdomen is soft.     Tenderness: There is no abdominal tenderness.  Musculoskeletal:        General: No swelling.     Cervical back: Neck supple.     Right lower leg: No edema.     Left lower leg: No edema.  Lymphadenopathy:      Cervical: No cervical adenopathy.  Skin:    General: Skin is warm and dry.     Capillary Refill: Capillary refill takes less than 2 seconds.     Coloration: Skin is not jaundiced.  Neurological:     General: No focal deficit present.     Mental Status: She is alert and oriented to person, place, and time.  Psychiatric:        Mood and Affect: Mood normal.     ED Results / Procedures / Treatments   Labs (all labs ordered are listed, but only abnormal results are displayed) Labs Reviewed  RESP PANEL BY RT-PCR (RSV, FLU A&B, COVID)  [id number]  GROUP A STREP BY  PCR    EKG None  Radiology DG Chest 2 View  Result Date: 06/04/2023 CLINICAL DATA:  Cough EXAM: CHEST - 2 VIEW COMPARISON:  05/29/2021 chest radiograph. FINDINGS: Stable cardiomediastinal silhouette with normal heart size. No pneumothorax. No pleural effusion. Lungs appear clear, with no acute consolidative airspace disease and no pulmonary edema. Chronic mid to lower thoracic vertebral compression fractures with associated mildly exaggerated thoracic kyphosis. IMPRESSION: No active cardiopulmonary disease. Electronically Signed   By: Delbert Phenix M.D.   On: 06/04/2023 08:59    Procedures Procedures    Medications Ordered in ED Medications - No data to display  ED Course/ Medical Decision Making/ A&P                                 Medical Decision Making Amount and/or Complexity of Data Reviewed Radiology: ordered.   Medical Decision Making:   AVREET CUZZORT is a 67 y.o. female who presented to the ED today with cough, congestion, sore throat.  Vital signs reviewed, mild hypertension, no fever.  She is well-appearing, she states her symptoms been ongoing for about a week and are mostly improving.  She has had mild headache which was not sudden onset, and nearly resolved.  No associated neurologic symptoms low concern for meningitis, intracranial bleeding, subarachnoid hemorrhage.  Her symptoms seem more consistent likely  upper respiratory infection, she has had recent sick exposures.  She does report worsening cough today, will obtain COVID and flu testing as well as chest x-ray to evaluate for possible pneumonia.  Her lungs here are clear and she has normal work of breathing.  She has mild sore throat, no signs of trismus, PTA, RPA, Ludwig's angina on exam.  She has not had any chest pain at all.  She does have significant history with prior liver transplant and she is on dialysis but has not missed any sessions.  I did discuss with her possibly doing lab work, she would prefer to not do lab work as she is a hard stick and she is starting to feel better other than her cough.  I think this is reasonable and I did discuss this with her.   Patient placed on continuous vitals and telemetry monitoring while in ED which was reviewed periodically.  Reviewed and confirmed nursing documentation for past medical history, family history, social history.  Initial Study Results:   Laboratory  All laboratory results reviewed.  Labs notable for COVID, flu, strep negative.  Radiology:  All images reviewed independently. Agree with radiology report at this time.     Reassessment and Plan:   On reassessment she remained stable.  She feels well.  She has no signs of pneumonia, COVID, flu.  I suspect she has a viral upper respiratory infection.  I recommend she call her doctor tomorrow to schedule follow-up.  She was comfortable this plan.  Discharged with strict return precautions.   Patient's presentation is most consistent with acute complicated illness / injury requiring diagnostic workup.           Final Clinical Impression(s) / ED Diagnoses Final diagnoses:  Acute cough    Rx / DC Orders ED Discharge Orders     None         Laurence Spates, MD 06/04/23 [phone]

## 2023-06-04 NOTE — ED Triage Notes (Signed)
Pt complains of HA, sore throat, coughing, and congestion that has lasted over a week.

## 2023-06-04 NOTE — Discharge Instructions (Signed)
You were seen today for cough.  Your chest x-ray did not show any pneumonia and your COVID and flu tests were negative.  I recommend you call your doctor tomorrow to schedule follow-up.  If you develop difficulty breathing, chest pain, fevers or any other new concerning symptoms you should return to the ED.

## 2023-06-08 DIAGNOSIS — N186 End stage renal disease: Principal | ICD-10-CM

## 2023-06-08 DIAGNOSIS — Z01810 Encounter for preprocedural cardiovascular examination: Principal | ICD-10-CM

## 2023-06-08 DIAGNOSIS — Z01818 Encounter for other preprocedural examination: Principal | ICD-10-CM

## 2023-06-08 NOTE — Unmapped (Signed)
A call was returned to the pt. She stated that she was going to have her lab work done at [facility] and wanted to be sure that they had the lab orders. Informed the pt that standing lab orders have been sent and are good until July 2025. Pt verbalized her understanding. Pt also reported that she had her mammogram done at [facility] on 05/26/23 for her kidney evaluation. Reviewed Care Everywhere and found the results. Informed the pt and stated that an update will be provided to Marcine Matar, TNC. Pt verbalized her understanding.    A message was sent to [facility] Marcine Matar that the pt had her mammogram completed on 05/26/23 at [facility]. Informed her that the results were in Care Everywhere.

## 2023-06-19 DIAGNOSIS — Z944 Liver transplant status: Principal | ICD-10-CM

## 2023-06-19 DIAGNOSIS — Z796 Long-term use of immunosuppressant medication: Principal | ICD-10-CM

## 2023-06-27 NOTE — Unmapped (Signed)
Returned call to Pt regarding question about shot. Messaged Kidney coordinator first, but she is unaware of anything regarding an injection.    Found this regarding an injection in previous note: Pt reported itching and stated that she had been getting shots for this at her dialysis center but they stopped the shots a few months ago. Pt was encouraged to discuss again with her dialysis center or PCP.    No answer and voicemail full.

## 2023-06-27 NOTE — Unmapped (Signed)
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     Was a Nurse, learning disability used for this call? No    Completed refill call assessment today to schedule patient's medication shipment from the [facility] [phone]).  All relevant notes have been reviewed.     Specialty medication(s) and dose(s) confirmed: Regimen is correct and unchanged.   Changes to medications: Ersa reports no changes at this time.  Changes to insurance: No  New side effects reported not previously addressed with a pharmacist or physician: None reported  Questions for the pharmacist: No    Confirmed patient received a Conservation officer, historic buildings and a Surveyor, mining with first shipment. The patient will receive a drug information handout for each medication shipped and additional FDA Medication Guides as required.       DISEASE/MEDICATION-SPECIFIC INFORMATION        N/A    SPECIALTY MEDICATION ADHERENCE     Medication Adherence    Patient reported X missed doses in the last month: 0  Specialty Medication: cycloSPORINE modified 25 MG capsule (Neoral)  Patient is on additional specialty medications: No  Patient is on more than two specialty medications: No  Any gaps in refill history greater than 2 weeks in the last 3 months: no  Demonstrates understanding of importance of adherence: yes  Informant: patient  Reliability of informant: reliable  Provider-estimated medication adherence level: good  Patient is at risk for Non-Adherence: No  Reasons for non-adherence: no problems identified              Were doses missed due to medication being on hold? No    cycloSPORINE modified 25 MG capsule (Neoral)  : 3  days of medicine on hand REFERRAL TO PHARMACIST     Referral to the pharmacist: Not needed      [facility]     Shipping address confirmed in Epic.       Delivery Scheduled: Yes, Expected medication delivery date: 06/29/23.     Medication will be delivered via UPS to the prescription address in Epic WAM.    Kao Conry' W Danae [facility] Specialty Technician

## 2023-06-28 LAB — CBC W/ DIFFERENTIAL
BANDED NEUTROPHILS ABSOLUTE COUNT: 0 10*3/uL (ref 0.0–0.1)
BASOPHILS ABSOLUTE COUNT: 0 10*3/uL (ref 0.0–0.2)
BASOPHILS RELATIVE PERCENT: 0 %
EOSINOPHILS ABSOLUTE COUNT: 0.1 10*3/uL (ref 0.0–0.4)
EOSINOPHILS RELATIVE PERCENT: 3 %
HEMATOCRIT: 32.1 % — ABNORMAL LOW (ref 34.0–46.6)
HEMOGLOBIN: 10.7 g/dL — ABNORMAL LOW (ref 11.1–15.9)
IMMATURE GRANULOCYTES: 0 %
LYMPHOCYTES ABSOLUTE COUNT: 0.6 10*3/uL — ABNORMAL LOW (ref 0.7–3.1)
LYMPHOCYTES RELATIVE PERCENT: 26 %
MEAN CORPUSCULAR HEMOGLOBIN CONC: 33.3 g/dL (ref 31.5–35.7)
MEAN CORPUSCULAR HEMOGLOBIN: 30.6 pg (ref 26.6–33.0)
MEAN CORPUSCULAR VOLUME: 92 fL (ref 79–97)
MONOCYTES ABSOLUTE COUNT: 0.3 10*3/uL (ref 0.1–0.9)
MONOCYTES RELATIVE PERCENT: 13 %
NEUTROPHILS ABSOLUTE COUNT: 1.4 10*3/uL (ref 1.4–7.0)
NEUTROPHILS RELATIVE PERCENT: 58 %
PLATELET COUNT: 157 10*3/uL (ref 150–450)
RED BLOOD CELL COUNT: 3.5 x10E6/uL — ABNORMAL LOW (ref 3.77–5.28)
RED CELL DISTRIBUTION WIDTH: 13.3 % (ref 11.7–15.4)
WHITE BLOOD CELL COUNT: 2.3 10*3/uL — CL (ref 3.4–10.8)

## 2023-06-28 LAB — BILIRUBIN, DIRECT: BILIRUBIN DIRECT: 0.14 mg/dL (ref 0.00–0.40)

## 2023-06-28 LAB — PHOSPHORUS: PHOSPHORUS, SERUM: 4.7 mg/dL — ABNORMAL HIGH (ref 3.0–4.3)

## 2023-06-28 LAB — COMPREHENSIVE METABOLIC PANEL
ALBUMIN: 3.6 g/dL — ABNORMAL LOW (ref 3.9–4.9)
ALKALINE PHOSPHATASE: 119 IU/L (ref 44–121)
ALT (SGPT): 6 IU/L (ref 0–32)
AST (SGOT): 17 IU/L (ref 0–40)
BILIRUBIN TOTAL (MG/DL) IN SER/PLAS: 0.4 mg/dL (ref 0.0–1.2)
BLOOD UREA NITROGEN: 18 mg/dL (ref 8–27)
BUN / CREAT RATIO: 3 — ABNORMAL LOW (ref 12–28)
CALCIUM: 9.5 mg/dL (ref 8.7–10.3)
CHLORIDE: 92 mmol/L — ABNORMAL LOW (ref 96–106)
CO2: 26 mmol/L (ref 20–29)
CREATININE: 5.85 mg/dL — ABNORMAL HIGH (ref 0.57–1.00)
GLOBULIN, TOTAL: 4 g/dL (ref 1.5–4.5)
GLUCOSE: 104 mg/dL — ABNORMAL HIGH (ref 70–99)
POTASSIUM: 3.6 mmol/L (ref 3.5–5.2)
SODIUM: 139 mmol/L (ref 134–144)
TOTAL PROTEIN: 7.6 g/dL (ref 6.0–8.5)

## 2023-06-28 LAB — GAMMA GT: GAMMA GLUTAMYL TRANSFERASE: 11 IU/L (ref 0–60)

## 2023-06-28 LAB — MAGNESIUM: MAGNESIUM: 2 mg/dL (ref 1.6–2.3)

## 2023-06-29 LAB — CYCLOSPORINE LEVEL: CYCLOSPORINE, LC/MS: 29 ng/mL — ABNORMAL LOW (ref 100–400)

## 2023-06-30 NOTE — Unmapped (Signed)
Lab results from 06/27/23 were reviewed by Vallery Sa ANP. No changes recommended at this time.

## 2023-06-30 NOTE — Unmapped (Signed)
Pt called in to see if dialysis can give her the Prevnar 20 vaccine, TNC said yes. Pt will obtain the vaccine.

## 2023-06-30 NOTE — Unmapped (Addendum)
error 

## 2023-07-17 DIAGNOSIS — Z796 Long-term use of immunosuppressant medication: Principal | ICD-10-CM

## 2023-07-17 DIAGNOSIS — Z944 Liver transplant status: Principal | ICD-10-CM

## 2023-07-28 LAB — CBC W/ DIFFERENTIAL
BANDED NEUTROPHILS ABSOLUTE COUNT: 0 10*3/uL (ref 0.0–0.1)
BASOPHILS ABSOLUTE COUNT: 0 10*3/uL (ref 0.0–0.2)
BASOPHILS RELATIVE PERCENT: 1 %
EOSINOPHILS ABSOLUTE COUNT: 0.1 10*3/uL (ref 0.0–0.4)
EOSINOPHILS RELATIVE PERCENT: 3 %
HEMATOCRIT: 35.8 % (ref 34.0–46.6)
HEMOGLOBIN: 11.9 g/dL (ref 11.1–15.9)
IMMATURE GRANULOCYTES: 0 %
LYMPHOCYTES ABSOLUTE COUNT: 0.8 10*3/uL (ref 0.7–3.1)
LYMPHOCYTES RELATIVE PERCENT: 33 %
MEAN CORPUSCULAR HEMOGLOBIN CONC: 33.2 g/dL (ref 31.5–35.7)
MEAN CORPUSCULAR HEMOGLOBIN: 31.4 pg (ref 26.6–33.0)
MEAN CORPUSCULAR VOLUME: 95 fL (ref 79–97)
MONOCYTES ABSOLUTE COUNT: 0.3 10*3/uL (ref 0.1–0.9)
MONOCYTES RELATIVE PERCENT: 11 %
NEUTROPHILS ABSOLUTE COUNT: 1.2 10*3/uL — ABNORMAL LOW (ref 1.4–7.0)
NEUTROPHILS RELATIVE PERCENT: 52 %
PLATELET COUNT: 175 10*3/uL (ref 150–450)
RED BLOOD CELL COUNT: 3.79 x10E6/uL (ref 3.77–5.28)
RED CELL DISTRIBUTION WIDTH: 13.3 % (ref 11.7–15.4)
WHITE BLOOD CELL COUNT: 2.4 10*3/uL — CL (ref 3.4–10.8)

## 2023-07-28 LAB — COMPREHENSIVE METABOLIC PANEL
ALBUMIN: 3.9 g/dL (ref 3.9–4.9)
ALKALINE PHOSPHATASE: 165 IU/L — ABNORMAL HIGH (ref 44–121)
ALT (SGPT): 9 IU/L (ref 0–32)
AST (SGOT): 19 IU/L (ref 0–40)
BILIRUBIN TOTAL (MG/DL) IN SER/PLAS: 0.4 mg/dL (ref 0.0–1.2)
BLOOD UREA NITROGEN: 20 mg/dL (ref 8–27)
BUN / CREAT RATIO: 3 — ABNORMAL LOW (ref 12–28)
CALCIUM: 9.8 mg/dL (ref 8.7–10.3)
CHLORIDE: 90 mmol/L — ABNORMAL LOW (ref 96–106)
CO2: 30 mmol/L — ABNORMAL HIGH (ref 20–29)
CREATININE: 5.84 mg/dL — ABNORMAL HIGH (ref 0.57–1.00)
GLOBULIN, TOTAL: 4.3 g/dL (ref 1.5–4.5)
GLUCOSE: 83 mg/dL (ref 70–99)
POTASSIUM: 4.1 mmol/L (ref 3.5–5.2)
SODIUM: 135 mmol/L (ref 134–144)
TOTAL PROTEIN: 8.2 g/dL (ref 6.0–8.5)

## 2023-07-28 LAB — PHOSPHORUS: PHOSPHORUS, SERUM: 4 mg/dL (ref 3.0–4.3)

## 2023-07-28 LAB — BILIRUBIN, DIRECT: BILIRUBIN DIRECT: 0.13 mg/dL (ref 0.00–0.40)

## 2023-07-28 LAB — MAGNESIUM: MAGNESIUM: 2.1 mg/dL (ref 1.6–2.3)

## 2023-07-28 LAB — GAMMA GT: GAMMA GLUTAMYL TRANSFERASE: 13 IU/L (ref 0–60)

## 2023-07-30 LAB — CYCLOSPORINE LEVEL: CYCLOSPORINE, LC/MS: 46 ng/mL — ABNORMAL LOW (ref 100–400)

## 2023-07-31 NOTE — Unmapped (Signed)
Lab results from 07/27/23 were reviewed by Vallery Sa ANP. No changes recommended at this time.

## 2023-08-14 DIAGNOSIS — Z944 Liver transplant status: Principal | ICD-10-CM

## 2023-08-14 DIAGNOSIS — Z796 Long-term use of immunosuppressant medication: Principal | ICD-10-CM

## 2023-08-15 NOTE — Unmapped (Signed)
Placed call to Pt, returning her call. Pt left vm regarding questions about kidney testing. No answer, vm full.    Routing to kidney coordinator to contact Pt.

## 2023-08-16 NOTE — Unmapped (Signed)
TNC returning call placed to on call coordinator. No answer, unable to leave VM.

## 2023-08-22 NOTE — Unmapped (Signed)
Pt returned call, TNC gave Pt the kidney coordinator's # to call as Pt has kidney evaluation questions.    Routed to kidney coordinator, Marcine Matar.

## 2023-08-22 NOTE — Unmapped (Signed)
Patient paged on call coordinator again. TNC returned call. No answer.

## 2023-08-29 ENCOUNTER — Encounter: Payer: Self-pay | Admitting: Hematology & Oncology

## 2023-09-07 NOTE — Unmapped (Signed)
Pt left voicemail for TNC regarding kidney evaluation tests. TNC placed call to Pt, explaining no upcoming appts in EPIC, gave Pt the kidney coordinator's name and number. Pt stated she does not like to answer her phone unless she recognized the number. TNC recommended Pt call kidney coordinator and place the # in Pt's phone so she will recognize it. Pt agreed to obtain labs in Nov at [facility] as well. Pt verbalized understanding.    Routing message to kidney coordinator Marcine Matar for awareness.

## 2023-09-11 DIAGNOSIS — Z796 Long-term use of immunosuppressant medication: Principal | ICD-10-CM

## 2023-09-11 DIAGNOSIS — Z944 Liver transplant status: Principal | ICD-10-CM

## 2023-09-18 NOTE — Unmapped (Signed)
11/25: patient's copay on cyclosporine has been $0 in past, she is aware if this changes in the future and has a price we would need credit/debit card on file in order to fill -[name] Clinical Assessment & Refill Coordination Note    Katherine Norton, DOB: Mar 06, 1956  Phone: [phone] (home)     All above HIPAA information was verified with patient.     Was a Nurse, learning disability used for this call? No    Specialty Medication(s):   Transplant: cyclosporine 25mg      Current Outpatient Medications   Medication Sig Dispense Refill    acetaminophen (TYLENOL) 325 MG tablet 2 tablets (650 mg total) by G-tube route every six (6) hours as needed. (Patient taking differently: Take 2 tablets (650 mg total) by mouth every six (6) hours as needed (headaches).)  0    albuterol 2.5 mg/0.5 mL nebulizer solution Inhale 0.5 mL (2.5 mg total) by nebulization every six (6) hours as needed for wheezing or shortness of breath. 30 each 12    allopurinoL (ZYLOPRIM) 100 MG tablet Take 1 tablet (100 mg total) by mouth daily as needed.      amLODIPine (NORVASC) 5 MG tablet Take 1 tablet (5 mg total) by mouth in the morning.      calcitonin, salmon, (MIACALCIN) 200 unit/actuation nasal spray 1 spray into alternating nostrils in the morning.      cetirizine (ZYRTEC) 5 MG tablet TAKE 1 (ONE) TABLET BY MOUTH DAILY, MAX DAILY DOSE: 1 TABLET      cycloSPORINE modified (NEORAL) 25 MG capsule Take 2 capsules (50 mg total) by mouth two (2) times a day. 360 capsule 3    diclofenac sodium (VOLTAREN) 1 % gel Apply 2 g topically in the morning.      fluticasone propionate (FLONASE) 50 mcg/actuation nasal spray 1 spray into each nostril.      food supplemt, lactose-reduced (ENSURE) Liqd Take 120 mL by mouth.      gentamicin 1 mg/mL, sodium citrate 4% Gent locks with dialysis 2.4 mL 4    heparin sodium,porcine (HEPARIN, PORCINE,) 1,000 unit/mL 1000 unit/mL injection This heparin is for dialysis use ONLY. RN if you are unable to aspirate from both lumes, call provider piror to using the catheter. 1 mL 0    heparin sodium,porcine (HEPARIN, PORCINE,) 5,000 unit/mL injection Inject 1 mL (5,000 Units total) under the skin every eight (8) hours. 1 mL 0    levETIRAcetam (KEPPRA) 500 mg/100 mL PgBk 1 tablet.      lipase/protease/amylase (PANCRELIPASE 16,000 ORAL) Pancrelipase 24000-76000      loratadine (CLARITIN) 10 mg tablet Take 1 tablet (10 mg total) by mouth daily as needed.      metoprolol succinate (TOPROL-XL) 50 MG 24 hr tablet 1 tablet (50 mg total) daily.      ondansetron (ZOFRAN) 4 MG tablet TAKE 1 TABLET BY MOUTH EVERY EIGHT HOURS AS NEEDED NAUSEA/VOMITING      oxyCODONE (ROXICODONE) 5 MG immediate release tablet 1 tablet (5 mg total) by G-tube route every six (6) hours as needed. for up to 5 days (Patient taking differently: Take 5 mg by mouth every six (6) hours as needed.) 10 tablet 0    vitamin B complx-C-FA-zinc cit (DIALYVITE 800 WITH ZINC 15) 0.8-15 mg Tab Take by mouth.      vitamin E, dl, acetate, 09.8 mg (50 unit)/mL Drop Take 1 capsule by mouth.       No  current facility-administered medications for this visit.        Changes to medications: Katherine Norton reports no changes at this time.    Allergies   Allergen Reactions    Iodinated Contrast Media     Ioxaglate Sodium     Latex     Succinylcholine Chloride      Other reaction(s): Other (See Comments)       Changes to allergies: No    SPECIALTY MEDICATION ADHERENCE     Cyclosporine 25mg   : 10 days of medicine on hand       Medication Adherence    Patient reported X missed doses in the last month: 0  Specialty Medication: cyclosporine 25mg   Patient is on additional specialty medications: No          Specialty medication(s) dose(s) confirmed: Regimen is correct and unchanged.     Are there any concerns with adherence? No    Adherence counseling provided? Not needed    CLINICAL MANAGEMENT AND INTERVENTION      Clinical Benefit Assessment:    Do you feel the medicine is effective or helping your condition? Yes    Clinical Benefit counseling provided? Not needed    Adverse Effects Assessment:    Are you experiencing any side effects? No    Are you experiencing difficulty administering your medicine? No    Quality of Life Assessment:    Quality of Life    Rheumatology  Oncology  Dermatology  Cystic Fibrosis          How many days over the past month did your transplant  keep you from your normal activities? For example, brushing your teeth or getting up in the morning. 0    Have you discussed this with your provider? Not needed    Acute Infection Status:    Acute infections noted within Epic:  No active infections  Patient reported infection: None    Therapy Appropriateness:    Is therapy appropriate based on current medication list, adverse reactions, adherence, clinical benefit and progress toward achieving therapeutic goals? Yes, therapy is appropriate and should be continued     DISEASE/MEDICATION-SPECIFIC INFORMATION      N/A    Solid Organ Transplant: Not Applicable    PATIENT SPECIFIC NEEDS     Does the patient have any physical, cognitive, or cultural barriers? No    Is the patient high risk? No    Did the patient require a clinical intervention? No    Does the patient require physician intervention or other additional services (i.e., nutrition, smoking cessation, social work)? No    SOCIAL DETERMINANTS OF HEALTH     At the [facility], we have learned that life circumstances - like trouble affording food, housing, utilities, or transportation can affect the health of many of our patients.   That is why we wanted to ask: are you currently experiencing any life circumstances that are negatively impacting your health and/or quality of life? Patient declined to answer    Social Drivers of Health     Food Insecurity: Not on file   Internet Connectivity: Not on file   Housing/Utilities: Not on file   Tobacco Use: Low Risk  (05/11/2023)    Patient History     Smoking Tobacco Use: Never     Smokeless Tobacco Use: Never     Passive Exposure: Never   Transportation Needs: Not on file   Alcohol Use: Not on file   Interpersonal Safety: Not on file  Physical Activity: Not on file   Intimate Partner Violence: Not on file   Stress: Not on file   Substance Use: Not on file (09/03/2023)   Social Connections: Not on file   Financial Resource Strain: Not on file   Depression: Not at risk (04/29/2021)    PHQ-2     PHQ-2 Score: 0   Health Literacy: Not on file       Would you be willing to receive help with any of the needs that you have identified today? Not applicable       SHIPPING     Specialty Medication(s) to be Shipped:   Transplant: cyclosporine 25mg     Other medication(s) to be shipped: No additional medications requested for fill at this time     Changes to insurance: No    Delivery Scheduled: Yes, Expected medication delivery date: 09/26/2023.     Medication will be delivered via UPS to the confirmed prescription address in [facility].    The patient will receive a drug information handout for each medication shipped and additional FDA Medication Guides as required.  Verified that patient has previously received a Conservation officer, historic buildings and a Surveyor, mining.    The patient or caregiver noted above participated in the development of this care plan and knows that they can request review of or adjustments to the care plan at any time.      All of the patient's questions and concerns have been addressed.    Thad Ranger, PharmD   [facility] Specialty Pharmacist

## 2023-09-20 NOTE — Unmapped (Signed)
1st attempt, unable to lvm,

## 2023-09-21 ENCOUNTER — Encounter: Payer: Self-pay | Admitting: Hematology & Oncology

## 2023-09-25 MED FILL — CYCLOSPORINE MODIFIED 25 MG CAPSULE: ORAL | 90 days supply | Qty: 360 | Fill #1

## 2023-09-27 NOTE — Unmapped (Signed)
2nd attempt, pt dd mwf, tues or thurs are the best days for appts, verified address w/ pt

## 2023-09-27 NOTE — Progress Notes (Unsigned)
No chief complaint on file.  HISTORY OF PRESENT ILLNESS:  09/27/23 ALL: Tami [name] returns for follow up for seizures. She continues levetiracetam 500mg  daily with extra 500mg  dose on dailysis days (M, W,F).   09/27/2022 ALL:  Tami [name] returns for follow up for seizures. She continues levetiracetam 500mg  daily with extra 500mg  dose on dailysis days (M, W,F). She continues to tolerate med. No seizures. She reports muscle jerks have decreased. She reports rare episodes and she feels that it happens when she is nodding off to sleep. She did not feel EEG was needed. She is feeling well and without concerns.   09/23/2021 ALL: Tami [name] returns for follow up for seizures. She continues levetiracetam 500mg  daily with extra 500mg  dose M,W,F. Previous EEG and MRI were unremarkable with last event 09/2018. She denies seizure activity since. She feels that she is doing fairly well. She does continue to note intermittent muscle jerks at night. May happen 1-2 times a month or may not occur for several months. She is alert and aware of symptoms. Always her upper extremities and usually both arms jump. Last a couple of seconds. No post ictal symptoms. She is tolerating meds. She continues dialysis. She is followed closely by PCP and nephrology. She lives alone. She drives without difficulty. She uses cane for stability and denies falls.   09/22/2020 ALL:  Tami [name] is a 67 y.o. female here today for follow up for seizures. She continues levetiracetam 500mg  daily with extra dose of 500mg  on dialysis days (M,W,F). She is tolerating medication well. No seizure activity. She does have some shaking of her whole body at night when in bed. She is aware of shaking and does not lose consciousness. She is able to get up and move around and shaking improves. She lost her son in a car accident last year and now lives alone. She has family locally that helps take care of her and her daughter visits often from [place]. She drives  without difficulty.   HISTORY (copied from previous note)  UPDATE (06/18/19, VRP): Since last visit, doing well. Symptoms are stable. No seizures. No alleviating or aggravating factors. Tolerating levetiracetam.     PRIOR HPI (12/17/18): 67 year old female here for evaluation of seizures.  History of liver transplant, hypertension, end-stage renal disease on hemodialysis.   10/03/2018 patient was at office visit, about to give blood when all of a sudden she became unresponsive and had some jerking movements.  Staff could not feel a pulse and started CPR.  Patient was immediately attended to by the rapid response team.  She was taken downstairs to the emergency room.  She may have had some type of gaze deviation initially resolved spontaneously.  While in the emergency room she had a second event with tonic-clonic seizures.  She was given IV Ativan and treated with Keppra.  She was admitted for further evaluation.   MRI, MRA of the head were obtained which showed no acute findings.  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  EEG was unremarkable.  Patient was discharged on no antiseizure medication.   Since that time patient is doing well.  No further events.   REVIEW OF SYSTEMS: Out of a complete 14 system review of symptoms, the patient complains only of the following symptoms, shaking at night and all other reviewed systems are negative.   ALLERGIES: Allergies  Allergen Reactions   Iodinated Contrast Media Other (See Comments)    UNSPECIFIED REACTION  Ioxaglate     UNSPECIFIED REACTION    Latex     UNSPECIFIED REACTION    Quelicin  [Succinylcholine Chloride] Other (See Comments)    Unknown reaction     HOME MEDICATIONS: Outpatient Medications Prior to Visit  Medication Sig Dispense Refill   acetaminophen (TYLENOL) 325 MG tablet Take 325 mg by mouth every 6 (six) hours as needed for mild pain or headache.     albuterol (VENTOLIN HFA) 108  (90 Base) MCG/ACT inhaler Inhale 4 puffs into the lungs every 6 (six) hours as needed for wheezing or shortness of breath.     allopurinol (ZYLOPRIM) 100 MG tablet Take 200 mg by mouth daily.  5   B Complex-C-Zn-Folic Acid (DIALYVITE 800-ZINC 15) 0.8 MG TABS Take 1 tablet by mouth daily.     cycloSPORINE modified (NEORAL) 25 MG capsule Take 50 mg by mouth 2 (two) times daily.      ENSURE (ENSURE) Take 237 mLs by mouth daily.     fluticasone (FLONASE) 50 MCG/ACT nasal spray Place 1 spray into both nostrils daily as needed for allergies or rhinitis.     gabapentin (NEURONTIN) 100 MG capsule Take 200 mg by mouth 2 (two) times daily as needed (pain).     levETIRAcetam (KEPPRA) 500 MG tablet Take 1 tablet (500 mg total) by mouth daily AND 1 tablet (500 mg total) 3 (three) times a week. Monday, Wednesday and Friday following dialysis. 130 tablet 3   lidocaine-prilocaine (EMLA) cream APPLY SMALL AMOUNT TO ACCESS SITE (AVF) 30 - 60 MINUTES BEFORE DIALYSIS. COVER WITH OCCLUSIVE DRESSING ( SARAN WRAP)     loratadine (CLARITIN) 10 MG tablet Take 10 mg by mouth daily as needed for allergies. X 10 days     losartan (COZAAR) 50 MG tablet Take 50 mg by mouth at bedtime.     metoprolol succinate (TOPROL-XL) 25 MG 24 hr tablet Take 12.5 mg by mouth daily.     ondansetron (ZOFRAN ODT) 4 MG disintegrating tablet Dissolve 1 tablet (4 mg total) by mouth every 8 (eight) hours as needed for nausea or vomiting. 20 tablet 0   oxyCODONE (OXY IR/ROXICODONE) 5 MG immediate release tablet Take 5 mg by mouth every 6 (six) hours as needed for pain.     Pancrelipase, Lip-Prot-Amyl, 24000-76000 units CPEP Take 1 capsule by mouth See admin instructions. Take 1 capsule with every meal and every snack.     pantoprazole (PROTONIX) 40 MG tablet Take 40 mg by mouth in the morning and at bedtime.     PARoxetine (PAXIL) 10 MG tablet Take 10 mg by mouth daily.     progesterone (PROMETRIUM) 100 MG capsule Take 100 mg by mouth daily.      sildenafil (REVATIO) 20 MG tablet Take 20 mg by mouth daily.     sucroferric oxyhydroxide (VELPHORO) 500 MG chewable tablet Chew 500-1,000 mg by mouth See admin instructions. Take 2 tablets three times a day with each meal and 1 tablets with each snack.     Vitamin D, Ergocalciferol, (DRISDOL) 1.25 MG (50000 UT) CAPS capsule Take 50,000 Units by mouth once a week. Sunday     No facility-administered medications prior to visit.     PAST MEDICAL HISTORY: Past Medical History:  Diagnosis Date   Anemia    Arthritis    ESRD (end stage renal disease) on dialysis (HCC)    "MWF; [place]" (10/04/2018)   Gout    Hepatitis, autoimmune (HCC) 12/08/2011   Hypertension  Seizures (HCC) 10/03/2018     PAST SURGICAL HISTORY: Past Surgical History:  Procedure Laterality Date   ABDOMINAL HYSTERECTOMY     AV FISTULA PLACEMENT Left 10/02/2018   Procedure: Creation of left arm Brachiocephalic Fistula;  Surgeon: Maeola Harman, MD;  Location: [facility] OR;  Service: Vascular;  Laterality: Left;   FISTULA SUPERFICIALIZATION Left 11/29/2018   Procedure: TRANSLOCATION/FISTULA SUPERFICIALIZATION OF LEFT ARM FISTULA;  Surgeon: Cephus Shelling, MD;  Location: [facility] OR;  Service: Vascular;  Laterality: Left;   LIVER TRANSPLANT     1996   REVISON OF ARTERIOVENOUS FISTULA Left 05/10/2022   Procedure: EXISION OF ULCER OVERLYING  ARTERIOVENOUS FISTULA;  Surgeon: Chuck Hint, MD;  Location: [facility] OR;  Service: Vascular;  Laterality: Left;   TONSILLECTOMY       FAMILY HISTORY: Family History  Problem Relation Age of Onset   Stroke Mother    Cancer Father    Lupus Sister        1 sister   Hypertension Sister    Cancer Brother        prostate- 1 brother   Lupus Brother      SOCIAL HISTORY: Social History   Socioeconomic History   Marital status: Divorced    Spouse name: Not on file   Number of children: 2   Years of education: some college   Highest education level: Not on file   Occupational History    Comment: na  Tobacco Use   Smoking status: Never   Smokeless tobacco: Never   Tobacco comments:    never used tobacco  Vaping Use   Vaping status: Never Used  Substance and Sexual Activity   Alcohol use: Not Currently    Alcohol/week: 0.0 standard drinks of alcohol   Drug use: No   Sexual activity: Not on file  Other Topics Concern   Not on file  Social History Narrative   Lives with son   Caffeine - tea , little    Social Determinants of Health   Financial Resource Strain: Not on file  Food Insecurity: Not on file  Transportation Needs: Not on file  Physical Activity: Not on file  Stress: Not on file  Social Connections: Not on file  Intimate Partner Violence: Not on file      PHYSICAL EXAM  There were no vitals filed for this visit.    There is no height or weight on file to calculate BMI.   Generalized: Well developed, in no acute distress  Cardiology: normal rate and rhythm, no murmur auscultated  Respiratory: clear to auscultation bilaterally    Neurological examination  Mentation: Alert oriented to time, place, history taking. Follows all commands speech and language fluent Cranial nerve II-XII: Pupils were equal round reactive to light. Extraocular movements were full, visual field were full on confrontational test. Facial sensation and strength were normal. Head turning and shoulder shrug  were normal and symmetric. Motor: The motor testing reveals 5 over 5 strength of bilateral upper extremities, 4/5 left hip flexion, 3+/4 right hip flexion.  Sensory: Sensory testing is intact to soft touch on all 4 extremities. No evidence of extinction is noted.  Coordination: Cerebellar testing reveals good finger-nose-finger and heel-to-shin bilaterally.  Gait and station: Gait is stable with cane. Tandem not attempted  Reflexes: Deep tendon reflexes are reduced but symmetric bilaterally.     DIAGNOSTIC DATA (LABS, IMAGING, TESTING) - I  reviewed patient records, labs, notes, testing and imaging myself where available.  Lab Results  Component Value Date   WBC 4.5 02/20/2020   HGB 11.6 (L) 05/10/2022   HCT 34.0 (L) 05/10/2022   MCV 101.2 (H) 02/20/2020   PLT 106 (L) 02/20/2020      Component Value Date/Time   NA 135 05/10/2022 0842   NA 137 08/10/2018 0000   NA 140 10/13/2017 1106   NA 138 04/11/2017 1023   K 5.6 (H) 05/10/2022 0842   K 5.3 no visable hemolysis (H) 10/13/2017 1106   K 5.2 No visable hemolysis (H) 04/11/2017 1023   CL 97 (L) 05/10/2022 0842   CL 104 10/13/2017 1106   CO2 32 02/20/2020 1411   CO2 17 (L) 10/13/2017 1106   CO2 19 (L) 04/11/2017 1023   GLUCOSE 77 05/10/2022 0842   GLUCOSE 116 10/13/2017 1106   BUN 34 (H) 05/10/2022 0842   BUN 1 (A) 08/10/2018 0000   BUN 58 (H) 10/13/2017 1106   BUN 33.4 (H) 04/11/2017 1023   CREATININE 4.80 (H) 05/10/2022 0842   CREATININE 4.00 (HH) 02/20/2020 1411   CREATININE 5.9 (HH) 10/13/2017 1106   CREATININE 1.8 (H) 04/11/2017 1023   CALCIUM 9.3 02/20/2020 1411   CALCIUM 8.4 10/13/2017 1106   CALCIUM 9.0 04/11/2017 1023   PROT 7.9 02/20/2020 1411   PROT 7.7 10/13/2017 1106   PROT 8.8 (H) 04/11/2017 1023   ALBUMIN 3.6 02/20/2020 1411   ALBUMIN 3.3 10/13/2017 1106   ALBUMIN 3.7 04/11/2017 1023   AST 15 02/20/2020 1411   AST 16 04/11/2017 1023   ALT 5 02/20/2020 1411   ALT 15 10/13/2017 1106   ALT 8 04/11/2017 1023   ALKPHOS 71 02/20/2020 1411   ALKPHOS 68 10/13/2017 1106   ALKPHOS 80 04/11/2017 1023   BILITOT 0.5 02/20/2020 1411   BILITOT 0.54 04/11/2017 1023   GFRNONAA 11 (L) 02/20/2020 1411   GFRAA 13 (L) 02/20/2020 1411   Lab Results  Component Value Date   CHOL 124 10/03/2018   HDL 23 (L) 10/03/2018   LDLCALC 73 10/03/2018   TRIG 138 10/03/2018   CHOLHDL 5.4 10/03/2018   No results found for: "HGBA1C" Lab Results  Component Value Date   VITAMINB12 301 10/03/2018   Lab Results  Component Value Date   TSH 0.831 10/03/2018     ASSESSMENT AND PLAN  67 y.o. year old female  has a past medical history of Anemia, Arthritis, ESRD (end stage renal disease) on dialysis (HCC), Gout, Hepatitis, autoimmune (HCC) (12/08/2011), Hypertension, and Seizures (HCC) (10/03/2018). here with   No diagnosis found.  Tami [name] is doing well, today.  She continues levetiracetam is tolerating well.  We will continue 500 mg daily with an extra dose of 500 mg on Monday Wednesday Friday.  Refills have been sent to [facility] pharmacy per her request. She will continue close follow-up with care team.  Healthy lifestyle habits encouraged.  She will follow-up with me in 1 year, sooner if needed.  She verbalizes understanding and agreement with this plan.  No orders of the defined types were placed in this encounter.    Shawnie Dapper, MSN, FNP-C 09/27/2023, 11:38 AM  [facility] [address], [address] [place], [place] [address] [phone]

## 2023-09-27 NOTE — Patient Instructions (Signed)
Below is our plan:  We will continue levetiracetam 500 mg daily with extra dose in evenings on dialysis days (M, W, F).   Please make sure you are consistent with timing of seizure medication. I recommend annual visit with primary care provider (PCP) for complete physical and routine blood work. I recommend daily intake of vitamin D (400-800iu) and calcium (800-1000mg ) for bone health. Discuss Dexa screening with PCP.   According to [place] law, you can not drive unless you are seizure / syncope free for at least 6 months and under physician's care.  Please maintain precautions. Do not participate in activities where a loss of awareness could harm you or someone else. No swimming alone, no tub bathing, no hot tubs, no driving, no operating motorized vehicles (cars, ATVs, motocycles, etc), lawnmowers, power tools or firearms. No standing at heights, such as rooftops, ladders or stairs. Avoid hot objects such as stoves, heaters, open fires. Wear a helmet when riding a bicycle, scooter, skateboard, etc. and avoid areas of traffic. Set your water heater to 120 degrees or less.  SUDEP is the sudden, unexpected death of someone with epilepsy, who was otherwise healthy. In SUDEP cases, no other cause of death is found when an autopsy is done. Each year, more than 1 in 1,000 people with epilepsy die from SUDEP. This is the leading cause of death in people with uncontrolled seizures. Until further answers are available, the best way to prevent SUDEP is to lower your risk by controlling seizures. Research has found that people with all types of epilepsy that experience convulsive seizures can be at risk.  Please make sure you are staying well hydrated. I recommend 50-60 ounces daily. Well balanced diet and regular exercise encouraged. Consistent sleep schedule with 6-8 hours recommended.   Please continue follow up with care team as directed.   Follow up with me in 1 year   You may receive a survey regarding  today's visit. I encourage you to leave honest feed back as I do use this information to improve patient care. Thank you for seeing me today!

## 2023-09-28 ENCOUNTER — Encounter: Payer: Self-pay | Admitting: Family Medicine

## 2023-09-28 ENCOUNTER — Ambulatory Visit (INDEPENDENT_AMBULATORY_CARE_PROVIDER_SITE_OTHER): Payer: 59 | Admitting: Family Medicine

## 2023-09-28 VITALS — BP 142/78 | HR 70 | Ht 60.0 in | Wt 114.0 lb

## 2023-09-28 DIAGNOSIS — R569 Unspecified convulsions: Secondary | ICD-10-CM

## 2023-09-28 MED ORDER — LEVETIRACETAM 500 MG PO TABS: ORAL_TABLET | ORAL | 3 refills | Status: DC

## 2023-10-09 DIAGNOSIS — Z944 Liver transplant status: Principal | ICD-10-CM

## 2023-10-09 DIAGNOSIS — Z796 Long-term use of immunosuppressant medication: Principal | ICD-10-CM

## 2023-10-23 NOTE — Unmapped (Signed)
Pt called in to rescheduled eval appointment. Rescheduled as requested for a Tuesday or a Thursday. Letter sent

## 2023-11-06 DIAGNOSIS — Z796 Long-term use of immunosuppressant medication: Principal | ICD-10-CM

## 2023-11-06 DIAGNOSIS — Z944 Liver transplant status: Principal | ICD-10-CM

## 2023-11-15 DIAGNOSIS — N186 End stage renal disease: Principal | ICD-10-CM

## 2023-11-15 DIAGNOSIS — Z01818 Encounter for other preprocedural examination: Principal | ICD-10-CM

## 2023-11-15 DIAGNOSIS — Z01810 Encounter for preprocedural cardiovascular examination: Principal | ICD-10-CM

## 2023-11-15 NOTE — Unmapped (Addendum)
Pt had dialysis center call to reschedule stress test for tomorrow 1/23. Pt wants to reschedule for April when weather is more reliable & sinuses are not acting up. Any day in April on T/Th is ok.Dialysis days: M/W/F.

## 2023-11-30 LAB — CBC W/ DIFFERENTIAL
BANDED NEUTROPHILS ABSOLUTE COUNT: 0 10*3/uL (ref 0.0–0.1)
BASOPHILS ABSOLUTE COUNT: 0 10*3/uL (ref 0.0–0.2)
BASOPHILS RELATIVE PERCENT: 0 %
EOSINOPHILS ABSOLUTE COUNT: 0.1 10*3/uL (ref 0.0–0.4)
EOSINOPHILS RELATIVE PERCENT: 2 %
HEMATOCRIT: 33 % — ABNORMAL LOW (ref 34.0–46.6)
HEMOGLOBIN: 10.9 g/dL — ABNORMAL LOW (ref 11.1–15.9)
IMMATURE GRANULOCYTES: 0 %
LYMPHOCYTES ABSOLUTE COUNT: 0.6 10*3/uL — ABNORMAL LOW (ref 0.7–3.1)
LYMPHOCYTES RELATIVE PERCENT: 27 %
MEAN CORPUSCULAR HEMOGLOBIN CONC: 33 g/dL (ref 31.5–35.7)
MEAN CORPUSCULAR HEMOGLOBIN: 30.2 pg (ref 26.6–33.0)
MEAN CORPUSCULAR VOLUME: 91 fL (ref 79–97)
MONOCYTES ABSOLUTE COUNT: 0.2 10*3/uL (ref 0.1–0.9)
MONOCYTES RELATIVE PERCENT: 10 %
NEUTROPHILS ABSOLUTE COUNT: 1.4 10*3/uL (ref 1.4–7.0)
NEUTROPHILS RELATIVE PERCENT: 61 %
PLATELET COUNT: 110 10*3/uL — ABNORMAL LOW (ref 150–450)
RED BLOOD CELL COUNT: 3.61 x10E6/uL — ABNORMAL LOW (ref 3.77–5.28)
RED CELL DISTRIBUTION WIDTH: 13.3 % (ref 11.7–15.4)
WHITE BLOOD CELL COUNT: 2.3 10*3/uL — CL (ref 3.4–10.8)

## 2023-11-30 LAB — COMPREHENSIVE METABOLIC PANEL
ALBUMIN: 3.8 g/dL — ABNORMAL LOW (ref 3.9–4.9)
ALKALINE PHOSPHATASE: 130 IU/L — ABNORMAL HIGH (ref 44–121)
ALT (SGPT): 11 IU/L (ref 0–32)
AST (SGOT): 19 IU/L (ref 0–40)
BILIRUBIN TOTAL (MG/DL) IN SER/PLAS: 0.5 mg/dL (ref 0.0–1.2)
BLOOD UREA NITROGEN: 7 mg/dL — ABNORMAL LOW (ref 8–27)
BUN / CREAT RATIO: 3 — ABNORMAL LOW (ref 12–28)
CALCIUM: 8.3 mg/dL — ABNORMAL LOW (ref 8.7–10.3)
CHLORIDE: 93 mmol/L — ABNORMAL LOW (ref 96–106)
CO2: 29 mmol/L (ref 20–29)
CREATININE: 2.55 mg/dL — ABNORMAL HIGH (ref 0.57–1.00)
GLOBULIN, TOTAL: 4.4 g/dL (ref 1.5–4.5)
GLUCOSE: 69 mg/dL — ABNORMAL LOW (ref 70–99)
POTASSIUM: 3.1 mmol/L — ABNORMAL LOW (ref 3.5–5.2)
SODIUM: 136 mmol/L (ref 134–144)
TOTAL PROTEIN: 8.2 g/dL (ref 6.0–8.5)

## 2023-11-30 LAB — GAMMA GT: GAMMA GLUTAMYL TRANSFERASE: 12 IU/L (ref 0–60)

## 2023-11-30 LAB — BILIRUBIN, DIRECT: BILIRUBIN DIRECT: 0.19 mg/dL (ref 0.00–0.40)

## 2023-11-30 LAB — PHOSPHORUS: PHOSPHORUS, SERUM: 2.4 mg/dL — ABNORMAL LOW (ref 3.0–4.3)

## 2023-11-30 LAB — MAGNESIUM: MAGNESIUM: 1.9 mg/dL (ref 1.6–2.3)

## 2023-12-01 LAB — CYCLOSPORINE LEVEL: CYCLOSPORINE, LC/MS: 44 ng/mL — ABNORMAL LOW (ref 100–400)

## 2023-12-01 NOTE — Unmapped (Addendum)
Lab results from 11/29/23 were reviewed by Vallery Sa ANP as seen in Epic. No changes recommended at this time. Pt to repeat labs in May.    Placed call to Pt, no answer, vm full. Pt returned call and verbalized understanding.

## 2023-12-01 NOTE — Unmapped (Signed)
error 

## 2023-12-04 DIAGNOSIS — Z944 Liver transplant status: Principal | ICD-10-CM

## 2023-12-04 DIAGNOSIS — Z796 Long-term use of immunosuppressant medication: Principal | ICD-10-CM

## 2023-12-06 DIAGNOSIS — H538 Other visual disturbances: Principal | ICD-10-CM

## 2023-12-06 DIAGNOSIS — H04129 Dry eye syndrome of unspecified lacrimal gland: Principal | ICD-10-CM

## 2023-12-06 DIAGNOSIS — Z79899 Other long term (current) drug therapy: Principal | ICD-10-CM

## 2023-12-06 DIAGNOSIS — Z944 Liver transplant status: Principal | ICD-10-CM

## 2023-12-06 NOTE — Unmapped (Signed)
Placed return call to Pt, Pt requesting referral to [facility] ophthalmology for blurry vision and dry eyes. Pt not satisfied with local ophthalmology.    Placed referral. Pt verbalized understanding.

## 2023-12-14 NOTE — Unmapped (Signed)
 [facility] Refill Coordination Note    Specialty Medication(s) to be Shipped:   Transplant: cyclosporine 25mg     Other medication(s) to be shipped: No additional medications requested for fill at this time     Katherine Norton, DOB: May 20, 1956  Phone: [phone] (home)       All above HIPAA information was verified with patient.     Was a Nurse, learning disability used for this call? No    Completed refill call assessment today to schedule patient's medication shipment from the [facility]  [phone]).  All relevant notes have been reviewed.     Specialty medication(s) and dose(s) confirmed: Regimen is correct and unchanged.   Changes to medications: Katherine Norton reports no changes at this time.  Changes to insurance: No  New side effects reported not previously addressed with a pharmacist or physician: None reported  Questions for the pharmacist: No    Confirmed patient received a Conservation officer, historic buildings and a Surveyor, mining with first shipment. The patient will receive a drug information handout for each medication shipped and additional FDA Medication Guides as required.       DISEASE/MEDICATION-SPECIFIC INFORMATION        N/A    SPECIALTY MEDICATION ADHERENCE     Medication Adherence    Patient reported X missed doses in the last month: 0  Specialty Medication: cyclosporine 50mg  BID  Patient is on additional specialty medications: No  Informant: patient              Were doses missed due to medication being on hold? No    cyclosporine 25 mg: 15 days of medicine on hand       REFERRAL TO PHARMACIST     Referral to the pharmacist: Not needed      [facility]     Shipping address confirmed in Epic.       Delivery Scheduled: Yes, Expected medication delivery date: 12/21/23.     Medication will be delivered via UPS to the prescription address in Epic WAM.    Darryl Nestle, PharmD   [facility]  Specialty Pharmacist

## 2023-12-14 NOTE — Unmapped (Signed)
 2/20 - Contacted pt on 2/12 - 1st Call: LVM for pt to return ph call. On 2/20 contacted pt 2nd Call to schedule for '25 a

## 2023-12-20 MED FILL — CYCLOSPORINE MODIFIED 25 MG CAPSULE: ORAL | 90 days supply | Qty: 360 | Fill #2

## 2023-12-26 NOTE — Unmapped (Signed)
 3/4 - Contacted pt to schedule for annual liver txp follow up and LVM for pt to return ph call.

## 2024-01-01 DIAGNOSIS — Z796 Long-term use of immunosuppressant medication: Principal | ICD-10-CM

## 2024-01-01 DIAGNOSIS — Z944 Liver transplant status: Principal | ICD-10-CM

## 2024-01-03 NOTE — Unmapped (Addendum)
 Call placed to patient to discuss scheduling annual post liver transplant visit - she is dialyzed on MWF thus will secure virtual visit with NP Metheny.  She confirms 10am visit time will be after dialysis treatment and would work virtually for her.  Sent patient link to activate her MyChart account - she verbalized agreement.    Secured appt date for 7/9 @ 10am - sent letter to patient via EPIC letter in mail per patient request - reminded her of need to repeat routine post liver txp labs ~ 1 week prior to appt she verbalized agreement.

## 2024-01-29 DIAGNOSIS — Z944 Liver transplant status: Principal | ICD-10-CM

## 2024-01-29 DIAGNOSIS — Z796 Long-term use of immunosuppressant medication: Principal | ICD-10-CM

## 2024-01-30 ENCOUNTER — Inpatient Hospital Stay: Admit: 2024-01-30 | Discharge: 2024-01-31

## 2024-01-30 DIAGNOSIS — Z01810 Encounter for preprocedural cardiovascular examination: Principal | ICD-10-CM

## 2024-01-30 DIAGNOSIS — Z01818 Encounter for other preprocedural examination: Principal | ICD-10-CM

## 2024-01-30 DIAGNOSIS — N186 End stage renal disease: Principal | ICD-10-CM

## 2024-01-30 MED ADMIN — Tc-99m Sestamibi (Cardiolite): 30.5 | INTRAVENOUS | @ 14:00:00 | Stop: 2024-01-30

## 2024-01-30 MED ADMIN — Tc-99m Sestamibi (Cardiolite): 10.9 | INTRAVENOUS | @ 13:00:00 | Stop: 2024-01-30

## 2024-01-30 MED ADMIN — regadenoson (LEXISCAN) injection: .4 mg | INTRAVENOUS | @ 13:00:00 | Stop: 2024-01-30

## 2024-02-05 NOTE — Unmapped (Signed)
 Pharmacy Pre-transplant Evaluation/ Selection Committee Note    Evaluation for kidney transplant    Patient:  Katherine Norton, Age:  68 y.o. old, Gender:  female    Allergies:  Iodinated contrast media, Ioxaglate sodium, Latex, and Succinylcholine chloride    Immunization history:    Immunization History   Administered Date(s) Administered    COVID-19 VAC,BIVALENT,MODERNA(BLUE CAP) 07/03/2021    COVID-19 VACCINE,MRNA(MODERNA)(PF) 12/24/2019, 01/21/2020, 09/11/2020, 03/13/2021    HEPATITIS B VACCINE ADULT, ADJUVANTED, IM(HEPLISAV B) 12/11/2020, 01/06/2021, 02/12/2021, 04/09/2021    HEPATITIS B VACCINE ADULT,IM(ENERGIX B, RECOMBIVAX) 08/24/2018, 09/24/2018, 10/26/2018, 02/22/2019, 05/22/2019, 06/21/2019, 07/19/2019, 11/22/2019    INFLUENZA QUAD HIGH DOSE 50YRS+(FLUZONE) 08/21/2020    Influenza Vaccine Quad(IM)6 MO-Adult(PF) 07/06/2017, 07/26/2019    Influenza Virus Vaccine, unspecified formulation 07/01/2015, 08/22/2016, 07/06/2017, 07/26/2019, 08/07/2021    PNEUMOCOCCAL POLYSACCHARIDE 23-VALENT 08/22/2018, 09/03/2018    Pneumococcal Conjugate 13-Valent 07/06/2017    SHINGRIX-ZOSTER VACCINE (HZV),RECOMBINANT,ADJUVANTED(IM) 01/15/2018, 05/14/2020       Alcohol, tobacco, illicit drug use history:  Social History     Substance and Sexual Activity   Alcohol Use No    Alcohol/week: 0.0 standard drinks of alcohol     Social History     Tobacco Use   Smoking Status Never    Passive exposure: Never   Smokeless Tobacco Never     Social History     Substance and Sexual Activity   Drug Use No       Home medications:  Current Outpatient Medications   Medication Instructions    acetaminophen (TYLENOL) 650 mg, G-tube, Every 6 hours PRN    albuterol 2.5 mg, Nebulization, Every 6 hours PRN    allopurinol (ZYLOPRIM) 100 mg, Oral, Daily PRN    amlodipine (NORVASC) 5 mg, Oral, Daily    calcitonin, salmon, (MIACALCIN) 200 unit/actuation nasal spray 1 spray, Alternating Nares, Daily (RT)    cetirizine (ZYRTEC) 5 MG tablet TAKE 1 (ONE) TABLET BY MOUTH DAILY, MAX DAILY DOSE: 1 TABLET    cycloSPORINE modified (NEORAL) 50 mg, Oral, 2 times a day    diclofenac sodium (VOLTAREN) 2 g, Topical, Daily    fluticasone propionate (FLONASE) 50 mcg/actuation nasal spray 1 spray, Nasal    food supplemt, lactose-reduced (ENSURE) Liqd 120 mL, Oral    gentamicin 1 mg/mL, sodium citrate 4% Gent locks with dialysis    heparin (porcine) 5,000 Units, Subcutaneous, Every 8 hours    heparin sodium,porcine (HEPARIN, PORCINE,) 1,000 unit/mL 1000 unit/mL injection This heparin is for dialysis use ONLY. RN if you are unable to aspirate from both lumes, call provider piror to using the catheter.    levETIRAcetam (KEPPRA) 500 mg/100 mL PgBk 1 tablet    lipase/protease/amylase (PANCRELIPASE 16,000 ORAL) Pancrelipase 24000-76000    loratadine (CLARITIN) 10 mg, Oral, Daily PRN    metoPROLOL succinate (TOPROL-XL) 50 mg, Daily (standard)    ondansetron (ZOFRAN) 4 MG tablet TAKE 1 TABLET BY MOUTH EVERY EIGHT HOURS AS NEEDED NAUSEA/VOMITING    oxyCODONE (ROXICODONE) 5 mg, G-tube, Every 6 hours PRN    vitamin B complx-C-FA-zinc cit (DIALYVITE 800 WITH ZINC 15) 0.8-15 mg Tab Oral    vitamin E, dl, acetate, 22.5 mg (50 unit)/mL Drop 1 capsule, Oral       Potential pharmacotherapy related concerns for transplantation:  1. anticoagulation concerns: N/A  2. cytochrome P-450 isoenzyme-mediated drug interactions:   none  3. other drug-interactions with medications post-transplant:  none  4. mental health-related medication(s):   none  5. chronic pain-related medication use:  none  6.  use of hormonal contraception and replacement therapy:  none  7. prior or current use of immunosuppressants: History of Liver transplant, on Cyclosporine      8. issues with drug absorption: none   9. use of herbal supplements:  none to transplant team's knowledge    Non-adherence concerns post-transplantation based on pre-transplant evaluation assessment:  no     Recommendations:  1. No pharmacological concerns for transplantation    Spent 10 minutes completing medication review and addressing any pharmacological concerns with members of the multidisciplinary transplant team.    Thank you,  Zilphia Hilt, CPP, PharmD

## 2024-02-05 NOTE — Unmapped (Signed)
 Kidney Transplant Nutrition Evaluation    Chart reviewed and there are no nutrition barriers for transplant at this time. BMI of 22.03 (05/11/23) and HgbA1c 4.4% (03/14/23) are acceptable for kidney transplant.     Will continue to follow per protocol or when consulted.     Arty Binning, MS, RDN, LDN  Clinical Dietitian II

## 2024-02-26 DIAGNOSIS — Z796 Long-term use of immunosuppressant medication: Principal | ICD-10-CM

## 2024-02-26 DIAGNOSIS — Z944 Liver transplant status: Principal | ICD-10-CM

## 2024-02-27 ENCOUNTER — Ambulatory Visit: Admit: 2024-02-27 | Discharge: 2024-02-28 | Payer: Medicare (Managed Care)

## 2024-02-27 DIAGNOSIS — H04129 Dry eye syndrome of unspecified lacrimal gland: Principal | ICD-10-CM

## 2024-02-27 DIAGNOSIS — H3589 Other specified retinal disorders: Principal | ICD-10-CM

## 2024-02-27 DIAGNOSIS — H538 Other visual disturbances: Principal | ICD-10-CM

## 2024-02-27 DIAGNOSIS — H26491 Other secondary cataract, right eye: Principal | ICD-10-CM

## 2024-02-27 DIAGNOSIS — Z796 Long term current use of immunosuppressive drug: Principal | ICD-10-CM

## 2024-02-27 DIAGNOSIS — Z944 Liver transplant status: Principal | ICD-10-CM

## 2024-02-27 NOTE — Unmapped (Signed)
 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 The patient has diabetes without any evidence of retinopathy.  The patient was advised to maintain tight glucose control, tight blood pressure control, and favorable levels of cholesterol.  Follow up in one year was recommended. Last A1c 4.4 in 02/2023.  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

## 2024-02-28 LAB — COMPREHENSIVE METABOLIC PANEL
ALBUMIN: 3.7 g/dL — ABNORMAL LOW (ref 3.9–4.9)
ALKALINE PHOSPHATASE: 110 IU/L (ref 44–121)
ALT (SGPT): 11 IU/L (ref 0–32)
AST (SGOT): 22 IU/L (ref 0–40)
BILIRUBIN TOTAL (MG/DL) IN SER/PLAS: 0.7 mg/dL (ref 0.0–1.2)
BLOOD UREA NITROGEN: 31 mg/dL — ABNORMAL HIGH (ref 8–27)
BUN / CREAT RATIO: 5 — ABNORMAL LOW (ref 12–28)
CALCIUM: 8.9 mg/dL (ref 8.7–10.3)
CHLORIDE: 91 mmol/L — ABNORMAL LOW (ref 96–106)
CO2: 25 mmol/L (ref 20–29)
CREATININE: 6.61 mg/dL — ABNORMAL HIGH (ref 0.57–1.00)
GLOBULIN, TOTAL: 4.1 g/dL (ref 1.5–4.5)
GLUCOSE: 155 mg/dL — ABNORMAL HIGH (ref 70–99)
POTASSIUM: 4.6 mmol/L (ref 3.5–5.2)
SODIUM: 135 mmol/L (ref 134–144)
TOTAL PROTEIN: 7.8 g/dL (ref 6.0–8.5)

## 2024-02-28 LAB — CBC W/ DIFFERENTIAL
BANDED NEUTROPHILS ABSOLUTE COUNT: 0 10*3/uL (ref 0.0–0.1)
BASOPHILS ABSOLUTE COUNT: 0 10*3/uL (ref 0.0–0.2)
BASOPHILS RELATIVE PERCENT: 0 %
EOSINOPHILS ABSOLUTE COUNT: 0.1 10*3/uL (ref 0.0–0.4)
EOSINOPHILS RELATIVE PERCENT: 2 %
HEMATOCRIT: 33.1 % — ABNORMAL LOW (ref 34.0–46.6)
HEMOGLOBIN: 11 g/dL — ABNORMAL LOW (ref 11.1–15.9)
IMMATURE GRANULOCYTES: 0 %
LYMPHOCYTES ABSOLUTE COUNT: 0.8 10*3/uL (ref 0.7–3.1)
LYMPHOCYTES RELATIVE PERCENT: 30 %
MEAN CORPUSCULAR HEMOGLOBIN CONC: 33.2 g/dL (ref 31.5–35.7)
MEAN CORPUSCULAR HEMOGLOBIN: 31.3 pg (ref 26.6–33.0)
MEAN CORPUSCULAR VOLUME: 94 fL (ref 79–97)
MONOCYTES ABSOLUTE COUNT: 0.4 10*3/uL (ref 0.1–0.9)
MONOCYTES RELATIVE PERCENT: 15 %
NEUTROPHILS ABSOLUTE COUNT: 1.4 10*3/uL (ref 1.4–7.0)
NEUTROPHILS RELATIVE PERCENT: 53 %
PLATELET COUNT: 135 10*3/uL — ABNORMAL LOW (ref 150–450)
RED BLOOD CELL COUNT: 3.51 x10E6/uL — ABNORMAL LOW (ref 3.77–5.28)
RED CELL DISTRIBUTION WIDTH: 12.8 % (ref 11.7–15.4)
WHITE BLOOD CELL COUNT: 2.6 10*3/uL — ABNORMAL LOW (ref 3.4–10.8)

## 2024-02-28 LAB — BILIRUBIN, DIRECT: BILIRUBIN DIRECT: 0.26 mg/dL (ref 0.00–0.40)

## 2024-02-28 LAB — GAMMA GT: GAMMA GLUTAMYL TRANSFERASE: 13 IU/L (ref 0–60)

## 2024-02-28 LAB — MAGNESIUM: MAGNESIUM: 1.8 mg/dL (ref 1.6–2.3)

## 2024-02-28 LAB — PHOSPHORUS: PHOSPHORUS, SERUM: 7.3 mg/dL — ABNORMAL HIGH (ref 3.0–4.3)

## 2024-02-29 LAB — CYCLOSPORINE LEVEL: CYCLOSPORINE, LC/MS: 28 ng/mL — ABNORMAL LOW (ref 100–400)

## 2024-03-01 NOTE — Unmapped (Signed)
 Lab results from 02/27/24 were reviewed by [place] Jest ANP as seen in Epic. No changes recommended at this time. Pt to repeat labs in August.    Placed call to Pt. Pt verbalized understanding.

## 2024-03-06 DIAGNOSIS — Z944 Liver transplant status: Principal | ICD-10-CM

## 2024-03-06 DIAGNOSIS — Z796 Long term current use of immunosuppressive drug: Principal | ICD-10-CM

## 2024-03-06 NOTE — Unmapped (Signed)
 Placed new standing [facility] orders

## 2024-03-08 NOTE — Unmapped (Signed)
 [facility] Refill Coordination Note    Specialty Medication(s) to be Shipped:   Transplant: cyclosporine 25mg     Other medication(s) to be shipped: No additional medications requested for fill at this time     Katherine Norton, DOB: 1955/11/30  Phone: [phone] (home)       All above HIPAA information was verified with patient.     Was a Nurse, learning disability used for this call? No    Completed refill call assessment today to schedule patient's medication shipment from the [facility]  [phone]).  All relevant notes have been reviewed.     Specialty medication(s) and dose(s) confirmed: Regimen is correct and unchanged.   Changes to medications: Manika reports no changes at this time.  Changes to insurance: No  New side effects reported not previously addressed with a pharmacist or physician: None reported  Questions for the pharmacist: No    Confirmed patient received a Conservation officer, historic buildings and a Surveyor, mining with first shipment. The patient will receive a drug information handout for each medication shipped and additional FDA Medication Guides as required.       DISEASE/MEDICATION-SPECIFIC INFORMATION        N/A    SPECIALTY MEDICATION ADHERENCE     Medication Adherence    Patient reported X missed doses in the last month: 0  Specialty Medication: cyclosporine 25mg   Patient is on additional specialty medications: No              Were doses missed due to medication being on hold? No    cycloSPORINE modified 25 MG capsule (Neoral): 14 days of medicine on hand       REFERRAL TO PHARMACIST     Referral to the pharmacist: Not needed      [facility]     Shipping address confirmed in Epic.     Cost and Payment: Patient has a $0 copay, payment information is not required.    Delivery Scheduled: Yes, Expected medication delivery date: 03/15/24.     Medication will be delivered via UPS to the prescription address in Epic WAM.    Aldena Worm   [facility]  Specialty Technician

## 2024-03-14 MED FILL — CYCLOSPORINE MODIFIED 25 MG CAPSULE: ORAL | 90 days supply | Qty: 360 | Fill #3

## 2024-03-20 DIAGNOSIS — Z01818 Encounter for other preprocedural examination: Principal | ICD-10-CM

## 2024-03-20 DIAGNOSIS — N186 End stage renal disease: Principal | ICD-10-CM

## 2024-03-20 NOTE — Unmapped (Signed)
1st attempt. Tried calling pt to schedule appointments. VM full.

## 2024-03-21 NOTE — Unmapped (Signed)
 2nd attempt. Tried calling pt to schedule appointment. VM full.

## 2024-03-22 NOTE — Unmapped (Signed)
 3rd attempt. Tried calling pt to schedule appointments. VM full. UTC letter sent. Routed back to [facility].

## 2024-03-29 NOTE — Unmapped (Signed)
 Placed return call to Pt who left vm for [facility]NC requesting a call. No answer, vm full.

## 2024-04-01 DIAGNOSIS — Z944 Liver transplant status: Principal | ICD-10-CM

## 2024-04-01 DIAGNOSIS — Z796 Long term current use of immunosuppressive drug: Principal | ICD-10-CM

## 2024-04-10 NOTE — Unmapped (Signed)
 Spoke with pt and confirmed appointments on 05/02/24. Sent letter.

## 2024-04-19 NOTE — Unmapped (Signed)
 Error

## 2024-04-23 DIAGNOSIS — Z944 Liver transplant status: Principal | ICD-10-CM

## 2024-04-23 DIAGNOSIS — Z796 Long term current use of immunosuppressive drug: Principal | ICD-10-CM

## 2024-04-23 NOTE — Unmapped (Signed)
 Received call from Pt req referral to [facility], fax # [phone]. Order placed.

## 2024-04-29 DIAGNOSIS — Z944 Liver transplant status: Principal | ICD-10-CM

## 2024-04-29 DIAGNOSIS — Z796 Long term current use of immunosuppressive drug: Principal | ICD-10-CM

## 2024-05-01 ENCOUNTER — Ambulatory Visit: Admit: 2024-05-01 | Payer: Medicare (Managed Care)

## 2024-05-01 NOTE — Unmapped (Unsigned)
 FOLLOW UP ANNUAL LIVER CLINIC NOTE     Patient Name: Katherine Norton  Medical Record Number: [id number]  Date of Service: 05/01/2024    Referring Physician: [facility]  Current complaint: Follow up Annual Liver    Assessment/Plan:     Jesus Poplin is a 68 y.o. 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 Recent LFT testing/images WNL.  Immunosuppressive monotherapy goal (40-80). Continue neoral  50mg  BID.    Hoping to be find a donor kidney transplant.    Today we discussed nutrition at length as a means of preventing frailty.         HEALTH MAINTENANCE:   Immunization History   Administered Date(s) Administered    COVID-19 VAC,BIVALENT,MODERNA(BLUE CAP) 07/03/2021    COVID-19 VACCINE,MRNA(MODERNA)(PF) 12/24/2019, 01/21/2020, 09/11/2020, 03/13/2021    HEPATITIS B VACCINE ADULT, ADJUVANTED, IM(HEPLISAV B) 12/11/2020, 01/06/2021, 02/12/2021, 04/09/2021    HEPATITIS B VACCINE ADULT,IM(ENERGIX B, RECOMBIVAX) 08/24/2018, 09/24/2018, 10/26/2018, 02/22/2019, 05/22/2019, 06/21/2019, 07/19/2019, 11/22/2019    INFLUENZA QUAD HIGH DOSE 74YRS+(FLUZONE) 08/21/2020    Influenza Vaccine Quad(IM)6 MO-Adult(PF) 07/06/2017, 07/26/2019    Influenza Virus Vaccine, unspecified formulation 07/01/2015, 08/22/2016, 07/06/2017, 07/26/2019, 08/07/2021    PNEUMOCOCCAL POLYSACCHARIDE 23-VALENT 08/22/2018, 09/03/2018    Pneumococcal Conjugate 13-Valent 07/06/2017    SHINGRIX -ZOSTER VACCINE (HZV),RECOMBINANT,ADJUVANTED(IM) 01/15/2018, 05/14/2020   Shingrix  vaccine series completed    Return to clinic: 1 year  Labs: Q 6-8 weeks    I personally spent 35 minutes face-to-face and non-face-to-face in the care of this patient, which includes all pre, intra, and post visit time on the date of service. Greater than 50% of the time was spent on counseling and the substance of the discussion.      Subjective:     HPI: Katherine Norton is a 68 y.o. 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 She returns today for her annual follow up. 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 She currently dialyizes via L upper arm AVF and doesn't make urine. She is tolerating HD well and has few changes to health over the last year but reports ongoing weight loss.    Denies fever, chills, arthralgias, and fatigue. Denies chest pain, SOB, N/V/D, constipation or abdominal pain. No acute complaint today.    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    PR TRACHEOSTOMY, PLANNED Midline 06/26/2018    Procedure: TRACHEOSTOMY PLANNED (SEPART PROC);  Surgeon: Almeda Ozell Minder, MD;  Location: MAIN OR [facility];  Service: ENT    TONSILLECTOMY         Family History   Problem Relation Age of Onset    Hypertension Mother     Hypertension Father     Prostate cancer Father     Lupus Sister     Lupus Brother     Kidney disease Neg Hx        Social History     Socioeconomic History    Marital status: Single  Tobacco Use    Smoking status: Never     Passive exposure: Never    Smokeless tobacco: Never   Substance and Sexual Activity    Alcohol use: No     Alcohol/week: 0.0 standard drinks of alcohol    Drug use: No       REVIEW OF SYSTEMS:   The balance of 10/12 systems is negative with the exception of HPI.    Objective:     MEDICATIONS:  Allergies   Allergen Reactions    Iodinated Contrast Media     Ioxaglate Sodium     Latex     Succinylcholine Chloride      Other reaction(s): Other (See Comments)       Current Outpatient Medications   Medication Sig Dispense Refill    acetaminophen  (TYLENOL ) 325 MG tablet 2 tablets (650 mg total) by G-tube route every six (6) hours as needed. (Patient taking differently: Take 2 tablets (650 mg total) by mouth every six (6) hours as needed (headaches).)  0    albuterol  2.5 mg/0.5 mL nebulizer solution Inhale 0.5 mL (2.5 mg total) by nebulization every six (6) hours as needed for wheezing or shortness of breath. 30 each 12    allopurinoL  (ZYLOPRIM ) 100 MG tablet Take 1 tablet (100 mg total) by mouth daily as needed.      amLODIPine (NORVASC) 5 MG tablet Take 1 tablet (5 mg total) by mouth in the morning.      calcitonin, salmon, (MIACALCIN) 200 unit/actuation nasal spray 1 spray into alternating nostrils in the morning.      cetirizine  (ZYRTEC ) 5 MG tablet TAKE 1 (ONE) TABLET BY MOUTH DAILY, MAX DAILY DOSE: 1 TABLET      cycloSPORINE  modified (NEORAL ) 25 MG capsule Take 2 capsules (50 mg total) by mouth two (2) times a day. 360 capsule 3    diclofenac sodium (VOLTAREN) 1 % gel Apply 2 g topically in the morning.      fluticasone  propionate (FLONASE ) 50 mcg/actuation nasal spray 1 spray into each nostril.      food supplemt, lactose-reduced (ENSURE) Liqd Take 120 mL by mouth.      gentamicin  1 mg/mL, sodium citrate  4% Gent locks with dialysis 2.4 mL 4    heparin  sodium,porcine (HEPARIN , PORCINE,) 1,000 unit/mL 1000 unit/mL injection This heparin  is for dialysis use ONLY. RN if you are unable to aspirate from both lumes, call provider piror to using the catheter. 1 mL 0    heparin  sodium,porcine (HEPARIN , PORCINE,) 5,000 unit/mL injection Inject 1 mL (5,000 Units total) under the skin every eight (8) hours. 1 mL 0    levETIRAcetam  (KEPPRA ) 500 mg/100 mL PgBk 1 tablet.      lipase/protease/amylase (PANCRELIPASE  16,000 ORAL) Pancrelipase  24000-76000      loratadine  (CLARITIN ) 10 mg tablet Take 1 tablet (10 mg total) by mouth daily as needed.      metoprolol  succinate (TOPROL -XL) 50 MG 24 hr tablet 1 tablet (50 mg total) daily.      ondansetron  (ZOFRAN ) 4 MG tablet TAKE 1 TABLET BY MOUTH EVERY EIGHT HOURS AS NEEDED NAUSEA/VOMITING      oxyCODONE  (ROXICODONE ) 5 MG immediate release tablet 1 tablet (5 mg total) by G-tube route every six (6) hours as needed. for up to 5 days (Patient taking differently: Take 5 mg by mouth every six (6) hours as needed.) 10 tablet 0    vitamin B complx-C-FA-zinc  cit (DIALYVITE  800 WITH ZINC  15) 0.8-15 mg Tab Take by mouth.  vitamin E , dl, acetate, 22.5 mg (50 unit)/mL Drop Take 1 capsule by mouth.       No current facility-administered medications for this visit.         PHYSICAL EXAM:  There were no vitals taken for this visit.      Wt Readings from Last 12 Encounters:   05/11/23 51.2 kg (112 lb 12.8 oz)   04/06/23 52 kg (114 lb 9.6 oz)   05/12/22 49.6 kg (109 lb 6.4 oz)   12/09/21 50 kg (110 lb 4.8 oz)   05/20/21 53.9 kg (118 lb 12.8 oz)   04/13/21 54.7 kg (120 lb 9.6 oz)   01/12/21 55.3 kg (122 lb)   07/16/20 52.2 kg (115 lb)   05/14/20 51.8 kg (114 lb 3.2 oz)   03/19/20 49.8 kg (109 lb 12.8 oz)   05/02/19 57.2 kg (126 lb)   06/26/18 86 kg (189 lb 9.5 oz)        General Appearance:  NAD, well appearing but thin   HEENT:  [place]/AT. Well hydrated moist mucous membranes of the oral cavity. No scleral icterus. No cervical lymphadenopathy.   Pulmonary:    Normal respiratory effort. CTAB, without wheezes/crackles/rhonchi. Good air movement.    Cardiovascular:  Regular rate and rhythm, no murmur noted.   Extremities No edema. Ecchymosis to LUE with swelling to LUE above AVF   Abdomen:   Normoactive bowel sounds, abdomen soft, non-tender and not distended, no Hepatosplenomegaly or masses. Abdominal scar well healed without hernia.    Musculoskeletal: No joint tenderness, full ROM. Normal gait.    Skin: Skin color, texture, turgor normal, no rashes or lesions.   Neurologic: Grossly intact.   Psychiatric: Judgement and insight appropriate.        LAB RESULTS:  Results for orders placed or performed in visit on 02/26/24   CBC w/ Differential   Result Value Ref Range WBC 2.6 (L) 3.4 - 10.8 x10E3/uL    RBC 3.51 (L) 3.77 - 5.28 x10E6/uL    HGB 11.0 (L) 11.1 - 15.9 g/dL    HCT 66.8 (L) 65.9 - 46.6 %    MCV 94 79 - 97 fL    MCH 31.3 26.6 - 33.0 pg    MCHC 33.2 31.5 - 35.7 g/dL    RDW 87.1 88.2 - 84.5 %    Platelet 135 (L) 150 - 450 x10E3/uL    Neutrophils % 53 Not Estab. %    Lymphocytes % 30 Not Estab. %    Monocytes % 15 Not Estab. %    Eosinophils % 2 Not Estab. %    Basophils % 0 Not Estab. %    Absolute Neutrophils 1.4 1.4 - 7.0 x10E3/uL    Absolute Lymphocytes 0.8 0.7 - 3.1 x10E3/uL    Absolute Monocytes  0.4 0.1 - 0.9 x10E3/uL    Absolute Eosinophils 0.1 0.0 - 0.4 x10E3/uL    Absolute Basophils  0.0 0.0 - 0.2 x10E3/uL    Immature Granulocytes 0 Not Estab. %    Bands Absolute 0.0 0.0 - 0.1 x10E3/uL   Comprehensive Metabolic Panel   Result Value Ref Range    Glucose 155 (H) 70 - 99 mg/dL    BUN 31 (H) 8 - 27 mg/dL    Creatinine 3.38 (H) 0.57 - 1.00 mg/dL    BUN/Creatinine Ratio 5 (L) 12 - 28    Sodium 135 134 - 144 mmol/L    Potassium 4.6 3.5 - 5.2 mmol/L    Chloride 91 (L)  96 - 106 mmol/L    CO2 25 20 - 29 mmol/L    Calcium 8.9 8.7 - 10.3 mg/dL    Total Protein 7.8 6.0 - 8.5 g/dL    Albumin 3.7 (L) 3.9 - 4.9 g/dL    Globulin, Total 4.1 1.5 - 4.5 g/dL    Total Bilirubin 0.7 0.0 - 1.2 mg/dL    Alkaline Phosphatase 110 44 - 121 IU/L    AST 22 0 - 40 IU/L    ALT 11 0 - 32 IU/L   Bilirubin, Direct   Result Value Ref Range    Bilirubin, Direct 0.26 0.00 - 0.40 mg/dL   Phosphorus Level   Result Value Ref Range    Phosphorus, Serum 7.3 (H) 3.0 - 4.3 mg/dL   Magnesium  Level   Result Value Ref Range    Magnesium  1.8 1.6 - 2.3 mg/dL   Gamma GT   Result Value Ref Range    GGT 13 0 - 60 IU/L   Cyclosporine  Level   Result Value Ref Range    Cyclosporine , LC/MS 28 (L) 100 - 400 ng/mL     *Note: Due to a large number of results and/or encounters for the requested time period, some results have not been displayed. A complete set of results can be found in Results Review. IMAGING Personally reviewed   CT Abdomen Pelvis Wo Contrast  Result Date: 05/14/2020  -Extensive aortobiiliac calcifications. Mild calcifications of the distal external iliac vasculature. Scattered calcifications of the common femoral arteries and internal iliac bilaterally.   -Sequelae of liver transplantation. Diffuse pneumobilia.   -Small atrophic native kidneys compatible with chronic medical renal disease. Nonobstructing left nephrolithiasis.     XR Chest 2 views  Result Date: 05/14/2020  No acute findings.    US  Liver Transplant  Result Date: 05/14/2020  --Patent hepatic transplant vasculature.   --Resistive indices within the common hepatic arteries are stable to slightly increased compared to prior, within normal limits. Recommend attention on follow-up.   --Decreased phasicity of the hepatic veins and IVC, findings may be related to venoocclusive disease however favor technical issues.   --The transplant liver is mildly enlarged and demonstrates increased echogenicity.

## 2024-05-02 ENCOUNTER — Ambulatory Visit: Admit: 2024-05-02 | Discharge: 2024-05-03 | Payer: Medicare (Managed Care) | Attending: Nephrology | Primary: Nephrology

## 2024-05-02 ENCOUNTER — Inpatient Hospital Stay: Admit: 2024-05-02 | Discharge: 2024-05-02 | Payer: Medicare (Managed Care)

## 2024-05-02 DIAGNOSIS — Z796 Long term current use of immunosuppressive drug: Principal | ICD-10-CM

## 2024-05-02 DIAGNOSIS — Z944 Liver transplant status: Principal | ICD-10-CM

## 2024-05-02 NOTE — Unmapped (Signed)
 Transplant Nephrology Pre-Transplant Evaluation Follow Up      Referring Physician   Georgina Comer BRAVO, FNP  [address]  [facility]  [place],  [place] [address]       History of Present Illness     The patient is a 68 y.o. female who presents for evaluation for potential kidney transplant. 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 Per Dr. Corky 03/19/20 Note: She has had CKD for a long time and was followed by Dr. Laura with a last f/u note in 2016.  Her creatinine was slowly increasing from a 1.1 in 2000 to 1.2 in 2014. During the hospital admission in 06/2018 she had to be initiated on dialysis. She was transferred from [facility] in 05/2018 for acute SOB and worsening M.S. She had multifocal PNA leading to ARDS. She was intubated and required tracheostomy and PEG tube placement. Her Tracheostomy was removed 06/2018 and PEG tube in 11/19. During the ICU admission she also had a PEA arrest and resuscitated for 1 minute.  On dialysis?: yes, iHD MWF via LUE AVF, started August 2019.  Dialysis Tolerance: She reports she tolerates her treatments without difficulty, denies frequent hypotension or cramping. She does not take midodrine. She is generally able to get to her dry weight.   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 Continues on cyclosporine  immunosuppression.    Prior blood product transfusions: yes, many years ago.   Pregnancy history: two pregnancies, two deliveries. Her liver began to enlarge during pregnancy and had gestational diabetes with first pregnancy, she denies having any other medical issues during either pregnancy.   Major Infections: multifocal pneumonia in 05/2018 requiring mechanical ventilation (See above). Otherwise patient denies (including chronic infections such as tuberculosis, HIV, and hepatitis)   Kidney stones:patient denies   Frequent UTI: patient denies   Urine output: anuric  History of Heart disease: patient denies. She had NM stress in 01/30/2024 There is a very small in size, mild fixed perfusion abnormality involving the apical segments. This is consistent with probable apical thinning, a normal variant, although it is visible on 2021 prior (appearing larger in 2021) and a small area of scar is not excluded. Echo done 04/18/23 with LVEF >55%  History of Lung disease: mild intermittent asthma, she saw pulmonology 12/2020 and had a moderate restriction and reduced DLCO on PFTs--they ordered a HRCT (report indicated no interstitial lung disease or bronchiectasis; mild emphysema and significant coronary artery calcification). She no longer sees pulmonologist. She has a rescue inhaler that she uses less than once per month.   History of Liver disease:s/p OLT for cryptogenic cirrhosis   History of Diabetes Mellitus: (+) diagnosed in 1980, required insulin  until she lost significant about of weight in 1997. She has not required it since.  History of Hypertension: (+) diagnosed in 1996  History of Peripheral Vascular Disease: patient denies. She had a CT Abd/Pelvis without contrast 05/14/2020, the radiology report from it noted extensive aortobiiliac calcifications. Another CT Abd/Pelvis in 02/2023 also reported diccuse calcified atheromatosis which is more pronounced in the abdominal aorta and common iliac arteries. When Transplant Surgery saw her in 2024, they recommended Arterial PVL of bilateral lower extremities--which noted arterial obstruction involving the tibioperoneal vessels on the  right and that ABIs are normal at rest   History of Stroke: patient denies   History of Neurologic disease: (+) history of seizure--just one in 2019, continues on Keppra   History of Cancer: patient denies   History of Autoimmune disease: patient denies   History of Coagulation Disorder: patient denies   History of Urinary Tract Abnormalities: patient denies   Psychiatric History: patient denies but depression listed in chart   Other Significant Medical and Surgical History: chronic pancytopenia (has been evaluated by Heme Onc)      Screening/Preventative Medicine:  Last Colonoscopy: 2021  Last PAP: s/p hysterectomy in 1981  Last Mammogram: 05/25/2023, no evidence for malignancy  Last Influenza Vaccine: Fall 2021  Last Pneumococcal Vaccine: she does not recall  COVID Vaccine: x4    Social History  Living situation: lives alone, no pets  Employment: unemployed, gets SSI. 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  Alcohol use: patient denies   Tobacco use: patient denies   Other non-prescription drug use: patient denies     Social History     Tobacco Use    Smoking status: Never     Passive exposure: Never    Smokeless tobacco: Never   Substance Use Topics    Alcohol use: No     Alcohol/week: 0.0 standard drinks of alcohol    Drug use: No         Past Medical History   Past Medical History:   Diagnosis Date    Chronic kidney disease     Cirrhosis        HTN (hypertension)          Past Surgical History   Past Surgical History:   Procedure Laterality Date    BRONCHOSCOPY  06/03/2018         HYSTERECTOMY      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    PR TRACHEOSTOMY, PLANNED Midline 06/26/2018    Procedure: TRACHEOSTOMY PLANNED (SEPART PROC);  Surgeon: Almeda Ozell Minder, MD;  Location: MAIN [facility];  Service: ENT    TONSILLECTOMY             Allergies   Allergies   Allergen Reactions    Iodinated Contrast Media     Ioxaglate Sodium     Latex     Succinylcholine Chloride      Other reaction(s): Other (See Comments)         Medications   Current Outpatient Medications   Medication Sig Dispense Refill    acetaminophen  (TYLENOL ) 325 MG tablet 2 tablets (650 mg total) by G-tube route every six (6) hours as needed. (Patient taking differently: Take 2 tablets (650 mg total) by mouth every six (6) hours as needed (headaches).)  0    albuterol  2.5 mg/0.5 mL nebulizer solution Inhale 0.5 mL (2.5 mg total) by nebulization every six (6) hours as needed for wheezing or shortness of breath. 30 each 12    allopurinoL  (ZYLOPRIM ) 100 MG tablet Take 1 tablet (100 mg total) by mouth daily as needed.      amLODIPine (NORVASC) 5 MG tablet Take 1 tablet (5 mg total) by mouth in the morning.      calcitonin, salmon, (MIACALCIN) 200 unit/actuation nasal spray 1 spray into alternating nostrils in the morning.      cetirizine  (ZYRTEC ) 5 MG tablet TAKE 1 (ONE) TABLET BY MOUTH DAILY, MAX DAILY DOSE: 1 TABLET      cycloSPORINE  modified (NEORAL ) 25 MG capsule Take 2 capsules (50  mg total) by mouth two (2) times a day. 360 capsule 3    diclofenac sodium (VOLTAREN) 1 % gel Apply 2 g topically in the morning.      fluticasone  propionate (FLONASE ) 50 mcg/actuation nasal spray 1 spray into each nostril.      food supplemt, lactose-reduced (ENSURE) Liqd Take 120 mL by mouth.      gentamicin  1 mg/mL, sodium citrate  4% Gent locks with dialysis 2.4 mL 4    heparin  sodium,porcine (HEPARIN , PORCINE,) 1,000 unit/mL 1000 unit/mL injection This heparin  is for dialysis use ONLY. RN if you are unable to aspirate from both lumes, call provider piror to using the catheter. 1 mL 0    heparin  sodium,porcine (HEPARIN , PORCINE,) 5,000 unit/mL injection Inject 1 mL (5,000 Units total) under the skin every eight (8) hours. 1 mL 0    levETIRAcetam  (KEPPRA ) 500 mg/100 mL PgBk 1 tablet.      lipase/protease/amylase (PANCRELIPASE  16,000 ORAL) Pancrelipase  24000-76000      loratadine  (CLARITIN ) 10 mg tablet Take 1 tablet (10 mg total) by mouth daily as needed.      metoprolol  succinate (TOPROL -XL) 50 MG 24 hr tablet 1 tablet (50 mg total) daily.      ondansetron  (ZOFRAN ) 4 MG tablet TAKE 1 TABLET BY MOUTH EVERY EIGHT HOURS AS NEEDED NAUSEA/VOMITING      oxyCODONE  (ROXICODONE ) 5 MG immediate release tablet 1 tablet (5 mg total) by G-tube route every six (6) hours as needed. for up to 5 days (Patient taking differently: Take 5 mg by mouth every six (6) hours as needed.) 10 tablet 0    vitamin B complx-C-FA-zinc  cit (DIALYVITE  800 WITH ZINC  15) 0.8-15 mg Tab Take by mouth.      vitamin E , dl, acetate, 22.5 mg (50 unit)/mL Drop Take 1 capsule by mouth.       No current facility-administered medications for this visit.         Family History   Family History   Problem Relation Age of Onset    Hypertension Mother     Hypertension Father     Prostate cancer Father     Lupus Sister     Lupus Brother     Kidney disease Neg Hx    Brother also has prostate cancer.     Denies family history of kidney disease      Review of Systems     no chest pain, no shortness of breath, no headaches, no nausea, no vomiting, no abdominal pain, no dyspnea with exertion, no light-headedness, no loss of consciousness, no difficulty swallowing, no fevers, no chills, no diarrhea, no blood in stool, no pain urinating, no blood in urine, no sensation of incomplete bladder emptying.     Otherwise as per HPI. All other systems are reviewed and are negative.      Frailty evaluation:    She denies hospitalizations or falls in the last year. She was in the ED for cough on 06/04/23 with unremarkable work up    She does still use a cane occasionally, but not a walker or wheelchair to assist with ambulation. She does not do all of her ADLs independently. (+) She still has a caregiver come to help every day--they help with cleaning, cooking, laundry. The patient says she does bathe and dress herself independently now. She has needed this help since she was discharged from the hospital in 2019 and due to significant fatigue after dialysis (she says this is more occasionally since last visit). She  last completed a course of physical therapy in 2019 after discharge. She does sometimes still drive.    (Each is 0-1 points)  1. Her weight has been stable over the last year. (yes, 0 points)  weight was 112 lb in July 2024 from 103 lb in July 2023, 115 lbs today  2. She reports symptoms of exhaustion only after dialysis for abotu 2 horus (How many days out of the last week where you felt everything was an effort?). (1 points)  3. Other Activities besides ADLs (she does physical therapy exercises in standing position, just walks around the house, 0 points)  4. Her gait speed is normal. ( 0 points)  5. Her grip strength is  mildy reduces symmetrically. (1 points)    Her frailty score is 2, indicating she is pre-frail    6 minute walk test: 1200 feet      Physical Exam     There were no vitals taken for this visit.  General: no acute distress   HEENT: mucous membranes moist  Dentition: edentulous  Neck: neck supple, no cervical lymphadenopathy appreciated   CV: normal rate, normal rhythm, no murmur appreciated   Lungs: clear to auscultation bilaterally   Abdomen: soft, non tender   Extremities: edema, LUE AVF with bruit and thrill  Musculoskeletal: no visible deformity, normal range of motion.   Pulses: intact distally throughout   Neurologic: awake, alert, and interactive, no gross neurological deficit          Laboratory Results and Imaging Data Reviewed in EPIC      We reviewed renal transplantation concepts with the patient in detail, including:   1) Kidney donor types including living donors, ABO incompatible, deceased donors, high KDPI deceased donors, and HCV positive deceased donors. We discussed the benefits of a living donor, particularly in older patients as it my facilitate transplant earlier. She was encouraged to have any potential donors contact our donor coordinator.  We discussed the average waiting time for a deceased donor kidney. We discussed that even if she is listed for transplant, she may develop a medical problem while on the waiting list that will make her too high risk for transplant prior to receiving a deceased donor kidney.  2) Increase risk of diabetes post transplant.   3) Increase risk of infections post surgery.   4) Increase risk of cancers.   5) Increase risk of heart attack especially during the first 3 months after surgery.   6) The possibility of needing dialysis post transplant.   7) Need for a kidney biopsy post transplant and treatment with medications as required.   8) Importance of being compliant with the medications and follow up appointment with physicians and other members of the transplant team.   9) Side effects of immunosuppression.   10) Recurrent disease.         Assessment:    68 y.o. 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 Clinical concerns include: aortoiliac atherosclerotic disease, pre-frailty. I was encouraged by her performance on today's 6 min walk test (1200 feet).    Her brother, who is with her, expressed interested in being a living donor. I gave him the Donor coordinator's card.     Recommendations/Plan:  -Ask transplant surgery to review 03/14/23 CT abd/pelvis to determine if there are adequate targets for kidney transplantation and when repeat is necessary. By my review, her iliac  circulation looks quite calcified.  -I encouraged her to reengage with physical therapy and routine exercise to help maintain and improve her functional stats.       Oneil Loader, DO  Division of Nephrology and Hypertension  [facility]  05/02/2024  10:56 AM

## 2024-05-02 NOTE — Unmapped (Signed)
 Pt called to report she missed her virtual annual visit with ANP Metheny on 07/09, Pt had dialysis and overslept. Pt prefers in person visit at [facility] with labs.    Placed request back in checklist for TPA.    Placed lab orders at [facility].    Routing note to TPA and ANP Metheny.

## 2024-05-02 NOTE — Unmapped (Signed)
 6 min walk done per Dr. Leobardo. Pt was able to walk 1200 feet without a cane.

## 2024-05-15 NOTE — Unmapped (Signed)
 TNC reached out to point person to r/s canceled SW appt as well as schedule missing surgeon appt that was to be [facility] visit for re-eval per checklist.

## 2024-05-16 ENCOUNTER — Other Ambulatory Visit (HOSPITAL_BASED_OUTPATIENT_CLINIC_OR_DEPARTMENT_OTHER): Payer: Self-pay

## 2024-05-16 DIAGNOSIS — Z1231 Encounter for screening mammogram for malignant neoplasm of breast: Secondary | ICD-10-CM

## 2024-05-20 ENCOUNTER — Other Ambulatory Visit: Payer: Self-pay | Admitting: Family

## 2024-05-20 DIAGNOSIS — D61818 Other pancytopenia: Secondary | ICD-10-CM

## 2024-05-20 DIAGNOSIS — D649 Anemia, unspecified: Secondary | ICD-10-CM

## 2024-05-21 ENCOUNTER — Inpatient Hospital Stay: Attending: Hematology & Oncology

## 2024-05-21 ENCOUNTER — Inpatient Hospital Stay (HOSPITAL_BASED_OUTPATIENT_CLINIC_OR_DEPARTMENT_OTHER): Admitting: Family

## 2024-05-21 VITALS — BP 129/72 | HR 70 | Temp 98.4°F | Resp 17 | Ht 60.0 in | Wt 119.8 lb

## 2024-05-21 DIAGNOSIS — E1122 Type 2 diabetes mellitus with diabetic chronic kidney disease: Secondary | ICD-10-CM | POA: Diagnosis not present

## 2024-05-21 DIAGNOSIS — D631 Anemia in chronic kidney disease: Secondary | ICD-10-CM

## 2024-05-21 DIAGNOSIS — D84821 Immunodeficiency due to drugs: Secondary | ICD-10-CM

## 2024-05-21 DIAGNOSIS — Z992 Dependence on renal dialysis: Secondary | ICD-10-CM

## 2024-05-21 DIAGNOSIS — N186 End stage renal disease: Secondary | ICD-10-CM | POA: Diagnosis not present

## 2024-05-21 DIAGNOSIS — D61818 Other pancytopenia: Secondary | ICD-10-CM | POA: Diagnosis not present

## 2024-05-21 DIAGNOSIS — D649 Anemia, unspecified: Secondary | ICD-10-CM

## 2024-05-21 DIAGNOSIS — Z944 Liver transplant status: Secondary | ICD-10-CM | POA: Diagnosis not present

## 2024-05-21 DIAGNOSIS — I120 Hypertensive chronic kidney disease with stage 5 chronic kidney disease or end stage renal disease: Secondary | ICD-10-CM | POA: Insufficient documentation

## 2024-05-21 LAB — CMP (CANCER CENTER ONLY)
ALT: 14 U/L (ref 0–44)
AST: 29 U/L (ref 15–41)
Albumin: 3.7 g/dL (ref 3.5–5.0)
Alkaline Phosphatase: 95 U/L (ref 38–126)
Anion gap: 17 — ABNORMAL HIGH (ref 5–15)
BUN: 30 mg/dL — ABNORMAL HIGH (ref 8–23)
CO2: 26 mmol/L (ref 22–32)
Calcium: 9.1 mg/dL (ref 8.9–10.3)
Chloride: 94 mmol/L — ABNORMAL LOW (ref 98–111)
Creatinine: 6.29 mg/dL — ABNORMAL HIGH (ref 0.44–1.00)
GFR, Estimated: 7 mL/min — ABNORMAL LOW (ref >60)
Glucose, Bld: 85 mg/dL (ref 70–99)
Potassium: 4.4 mmol/L (ref 3.5–5.1)
Sodium: 137 mmol/L (ref 135–145)
Total Bilirubin: 0.8 mg/dL (ref 0.0–1.2)
Total Protein: 8.5 g/dL — ABNORMAL HIGH (ref 6.5–8.1)

## 2024-05-21 LAB — CBC WITH DIFFERENTIAL (CANCER CENTER ONLY)
Abs Immature Granulocytes: 0.01 K/uL (ref 0.00–0.07)
Basophils Absolute: 0 K/uL (ref 0.0–0.1)
Basophils Relative: 1 %
Eosinophils Absolute: 0.1 K/uL (ref 0.0–0.5)
Eosinophils Relative: 3 %
HCT: 31.4 % — ABNORMAL LOW (ref 36.0–46.0)
Hemoglobin: 10.5 g/dL — ABNORMAL LOW (ref 12.0–15.0)
Immature Granulocytes: 0 %
Lymphocytes Relative: 33 %
Lymphs Abs: 0.9 K/uL (ref 0.7–4.0)
MCH: 32 pg (ref 26.0–34.0)
MCHC: 33.4 g/dL (ref 30.0–36.0)
MCV: 95.7 fL (ref 80.0–100.0)
Monocytes Absolute: 0.4 K/uL (ref 0.1–1.0)
Monocytes Relative: 14 %
Neutro Abs: 1.3 K/uL — ABNORMAL LOW (ref 1.7–7.7)
Neutrophils Relative %: 49 %
Platelet Count: 130 K/uL — ABNORMAL LOW (ref 150–400)
RBC: 3.28 MIL/uL — ABNORMAL LOW (ref 3.87–5.11)
RDW: 12.6 % (ref 11.5–15.5)
WBC Count: 2.7 K/uL — ABNORMAL LOW (ref 4.0–10.5)
nRBC: 0 % (ref 0.0–0.2)

## 2024-05-21 LAB — RETICULOCYTES
Immature Retic Fract: 2.2 % — ABNORMAL LOW (ref 2.3–15.9)
RBC.: 3.27 MIL/uL — ABNORMAL LOW (ref 3.87–5.11)
Retic Count, Absolute: 27.1 K/uL (ref 19.0–186.0)
Retic Ct Pct: 0.8 % (ref 0.4–3.1)

## 2024-05-21 LAB — IRON AND IRON BINDING CAPACITY (CC-WL,HP ONLY)
Iron: 78 ug/dL (ref 28–170)
Saturation Ratios: 40 % — ABNORMAL HIGH (ref 10.4–31.8)
TIBC: 193 ug/dL — ABNORMAL LOW (ref 250–450)
UIBC: 115 ug/dL

## 2024-05-21 LAB — LACTATE DEHYDROGENASE: LDH: 145 U/L (ref 98–192)

## 2024-05-21 LAB — FERRITIN: Ferritin: 1390 ng/mL — ABNORMAL HIGH (ref 11–307)

## 2024-05-21 LAB — SAVE SMEAR(SSMR), FOR PROVIDER SLIDE REVIEW

## 2024-05-21 NOTE — Progress Notes (Signed)
 Hematology/Oncology Consultation   Name: Tami [name]      MRN: [id number]    Location: Room/bed info not found  Date: 05/21/2024 Time:10:01 AM   REFERRING PHYSICIAN:  Levan Baron, NP  REASON FOR CONSULT:  Long term current use of immunosuppressive drug and liver replaced by transplant   DIAGNOSIS: Chronic pancytopenia   HISTORY OF PRESENT ILLNESS: Tami [name] is patient known very well to us . We used to follow her for many years for chronic pancytopenia and anemia of chronic kidney disease.  She goes to dialysis M, W and F and received Mircera once every 2 weeks with treatment.  She states that she does not make any urine.  She is currently being worked up with [facility] for kidney transplant. 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   She has been on immunosuppressive medication long term. She is currently taking Neoral .  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  Her counts overall have remained stable when compared to years past. WBC count is 2.7, Hgb 10.5, MCV 95 and platelets 130.   She denies any issue with frequent or recurrent infections.  She has some mild fatigue with dialysis but states that she is still able to get out and do the things she enjoys.  No abnormal blood loss reported. No abnormal; bruising or petechiae.  No fever, chills, n/v, cough, rash, dizziness, SOB, chest pain, palpitations, abdominal pain or changes in bowel habits.  No swelling, tenderness, numbness or tingling in his her extremities.  No falls or syncope reported.  No new seizures on Keppra . She also has history of type II diabetes. Hgb A1c last year was 4.4. No new results avialable in the system at this time.  No thyroid  disease.   No smoking, ETOH or recreational drug use.  Appetite is good and hydration appropriate. Weight is 119 lbs. She states that her transplant team would like her to  reach 125 lbs.  Her daughter now lives in [place]  and visits every 2 months.   ROS: All other 10 point review of systems is negative.   PAST MEDICAL HISTORY:   Past Medical History:  Diagnosis Date   Anemia    Arthritis    ESRD (end stage renal disease) on dialysis (HCC)    MWF; [place] (10/04/2018)   Gout    Hepatitis, autoimmune (HCC) 12/08/2011   Hypertension    Seizures (HCC) 10/03/2018    ALLERGIES: Allergies  Allergen Reactions   Iodinated Contrast Media Other (See Comments)    UNSPECIFIED REACTION    Ioxaglate     UNSPECIFIED REACTION    Latex     UNSPECIFIED REACTION    Quelicin  [Succinylcholine Chloride] Other (See Comments)    Unknown reaction      MEDICATIONS:  Current Outpatient Medications on File Prior to Visit  Medication Sig Dispense Refill   acetaminophen  (TYLENOL ) 325 MG tablet Take 325 mg by mouth every 6 (six) hours as needed for mild pain or headache.     albuterol  (VENTOLIN  HFA) 108 (90 Base) MCG/ACT inhaler Inhale 4 puffs into the lungs every 6 (six) hours as needed for wheezing or shortness of breath.     allopurinol  (ZYLOPRIM ) 100 MG tablet Take 200 mg by mouth daily.  5   B Complex-C-Zn-Folic Acid  (DIALYVITE 800-ZINC 15) 0.8 MG TABS Take 1 tablet by mouth daily.     cycloSPORINE   modified (NEORAL ) 25 MG capsule Take 50 mg by mouth 2 (two) times daily.      ENSURE (ENSURE) Take 237 mLs by mouth daily.     gabapentin  (NEURONTIN ) 100 MG capsule Take 200 mg by mouth 2 (two) times daily as needed (pain).     levETIRAcetam  (KEPPRA ) 500 MG tablet Take 1 tablet (500 mg total) by mouth daily AND 1 tablet (500 mg total) 3 (three) times a week. Monday, Wednesday and Friday following dialysis. 130 tablet 3   lidocaine -prilocaine (EMLA) cream APPLY SMALL AMOUNT TO ACCESS SITE (AVF) 30 - 60 MINUTES BEFORE DIALYSIS. COVER WITH OCCLUSIVE DRESSING ( SARAN WRAP)     loratadine (CLARITIN) 10 MG tablet Take 10 mg by mouth daily as needed for allergies. X 10 days      losartan (COZAAR) 50 MG tablet Take 50 mg by mouth at bedtime.     metoprolol  succinate (TOPROL -XL) 25 MG 24 hr tablet Take 12.5 mg by mouth daily.     ondansetron  (ZOFRAN  ODT) 4 MG disintegrating tablet Dissolve 1 tablet (4 mg total) by mouth every 8 (eight) hours as needed for nausea or vomiting. 20 tablet 0   oxyCODONE  (OXY IR/ROXICODONE ) 5 MG immediate release tablet Take 5 mg by mouth every 6 (six) hours as needed for pain.     Pancrelipase , Lip-Prot-Amyl, 24000-76000 units CPEP Take 1 capsule by mouth See admin instructions. Take 1 capsule with every meal and every snack.     pantoprazole  (PROTONIX ) 40 MG tablet Take 40 mg by mouth in the morning and at bedtime.     progesterone (PROMETRIUM) 100 MG capsule Take 100 mg by mouth daily.     sildenafil (REVATIO) 20 MG tablet Take 20 mg by mouth daily.     sucroferric oxyhydroxide (VELPHORO) 500 MG chewable tablet Chew 500-1,000 mg by mouth See admin instructions. Take 2 tablets three times a day with each meal and 1 tablets with each snack.     Vitamin D , Ergocalciferol , (DRISDOL) 1.25 MG (50000 UT) CAPS capsule Take 50,000 Units by mouth once a week. Sunday     fluticasone (FLONASE) 50 MCG/ACT nasal spray Place 1 spray into both nostrils daily as needed for allergies or rhinitis. (Patient not taking: Reported on 05/21/2024)     PARoxetine (PAXIL) 10 MG tablet Take 10 mg by mouth daily. (Patient not taking: Reported on 05/21/2024)     No current facility-administered medications on file prior to visit.     PAST SURGICAL HISTORY Past Surgical History:  Procedure Laterality Date   ABDOMINAL HYSTERECTOMY     AV FISTULA PLACEMENT Left 10/02/2018   Procedure: Creation of left arm Brachiocephalic Fistula;  Surgeon: Cain, Brandon Christopher, MD;  Location: [facility] OR;  Service: Vascular;  Laterality: Left;   FISTULA SUPERFICIALIZATION Left 11/29/2018   Procedure: TRANSLOCATION/FISTULA SUPERFICIALIZATION OF LEFT ARM FISTULA;  Surgeon: Clark, Christopher J,  MD;  Location: [facility] OR;  Service: Vascular;  Laterality: Left;   LIVER TRANSPLANT     19 96   REVISON OF ARTERIOVENOUS FISTULA Left 05/10/2022   Procedure: EXISION OF ULCER OVERLYING  ARTERIOVENOUS FISTULA;  Surgeon: Eliza Lonni RAMAN, MD;  Location: [facility] OR;  Service: Vascular;  Laterality: Left;   TONSILLECTOMY      FAMILY HISTORY: Family History  Problem Relation Age of Onset   Stroke Mother    Cancer Father    Lupus Sister        1 sister   Hypertension Sister    Cancer Brother  prostate- 1 brother   Lupus Brother     SOCIAL HISTORY:  reports that she has never smoked. She has never used smokeless tobacco. She reports that she does not currently use alcohol. She reports that she does not use drugs.  PERFORMANCE STATUS: The patient's performance status is 1 - Symptomatic but completely ambulatory  PHYSICAL EXAM: Most Recent Vital Signs: Blood pressure 129/72, pulse 70, temperature 98.4 F (36.9 C), temperature source Oral, resp. rate 17, height 5' (1.524 m), weight 119 lb 12.8 oz (54.3 kg), SpO2 99%. BP 129/72 (BP Location: Right Arm, Patient Position: Sitting)   Pulse 70   Temp 98.4 F (36.9 C) (Oral)   Resp 17   Ht 5' (1.524 m)   Wt 119 lb 12.8 oz (54.3 kg)   SpO2 99%   BMI 23.40 kg/m   General Appearance:    Alert, cooperative, no distress, appears stated age  Head:    Normocephalic, without obvious abnormality, atraumatic  Eyes:    PERRL, conjunctiva/corneas clear, EOM's intact, fundi    benign, both eyes        Throat:   Lips, mucosa, and tongue normal; teeth and gums normal  Neck:   Supple, symmetrical, trachea midline, no adenopathy;    thyroid :  no enlargement/tenderness/nodules; no carotid   bruit or JVD  Back:     Symmetric, no curvature, ROM normal, no CVA tenderness  Lungs:     Clear to auscultation bilaterally, respirations unlabored  Chest Wall:    No tenderness or deformity   Heart:    Regular rate and rhythm, S1 and S2 normal, no murmur,  rub   or gallop     Abdomen:     Soft, non-tender, bowel sounds active all four quadrants,    no masses, no organomegaly        Extremities:   Extremities normal, atraumatic, no cyanosis or edema  Pulses:   2+ and symmetric all extremities  Skin:   Skin color, texture, turgor normal, no rashes or lesions  Lymph nodes:   Cervical, supraclavicular, and axillary nodes normal  Neurologic:   CNII-XII intact, normal strength, sensation and reflexes    throughout    LABORATORY DATA:  Results for orders placed or performed in visit on 05/21/24 (from the past 48 hours)  CBC with Differential (Cancer Center Only)     Status: Abnormal   Collection Time: 05/21/24  8:42 AM  Result Value Ref Range   WBC Count 2.7 (L) 4.0 - 10.5 K/uL   RBC 3.28 (L) 3.87 - 5.11 MIL/uL   Hemoglobin 10.5 (L) 12.0 - 15.0 g/dL   HCT 68.5 (L) 63.9 - 53.9 %   MCV 95.7 80.0 - 100.0 fL   MCH 32.0 26.0 - 34.0 pg   MCHC 33.4 30.0 - 36.0 g/dL   RDW 87.3 88.4 - 84.4 %   Platelet Count 130 (L) 150 - 400 K/uL   nRBC 0.0 0.0 - 0.2 %   Neutrophils Relative % 49 %   Neutro Abs 1.3 (L) 1.7 - 7.7 K/uL   Lymphocytes Relative 33 %   Lymphs Abs 0.9 0.7 - 4.0 K/uL   Monocytes Relative 14 %   Monocytes Absolute 0.4 0.1 - 1.0 K/uL   Eosinophils Relative 3 %   Eosinophils Absolute 0.1 0.0 - 0.5 K/uL   Basophils Relative 1 %   Basophils Absolute 0.0 0.0 - 0.1 K/uL   Immature Granulocytes 0 %   Abs Immature Granulocytes 0.01 0.00 - 0.07 K/uL  Comment: Performed at [facility], [address] [place], [place] [address]  CMP (Cancer Center only)     Status: Abnormal   Collection Time: 05/21/24  8:42 AM  Result Value Ref Range   Sodium 137 135 - 145 mmol/L   Potassium 4.4 3.5 - 5.1 mmol/L   Chloride 94 (L) 98 - 111 mmol/L   CO2 26 22 - 32 mmol/L   Glucose, Bld 85 70 - 99 mg/dL    Comment: Glucose reference range applies only to samples taken after fasting for at least 8 hours.   BUN 30 (H) 8 - 23 mg/dL    Creatinine 3.70 (H) 0.44 - 1.00 mg/dL   Calcium  9.1 8.9 - 10.3 mg/dL   Total Protein 8.5 (H) 6.5 - 8.1 g/dL   Albumin  3.7 3.5 - 5.0 g/dL   AST 29 15 - 41 U/L   ALT 14 0 - 44 U/L   Alkaline Phosphatase 95 38 - 126 U/L   Total Bilirubin 0.8 0.0 - 1.2 mg/dL   GFR, Estimated 7 (L) >60 mL/min    Comment: (NOTE) Calculated using the CKD-EPI Creatinine Equation (2021)    Anion gap 17 (H) 5 - 15    Comment: Performed at [facility], [address] [place], [place] [address]  Save Smear for Provider Slide Review     Status: None   Collection Time: 05/21/24  8:42 AM  Result Value Ref Range   Smear Review SMEAR STAINED AND AVAILABLE FOR REVIEW     Comment: Performed at [facility], [address] [place], [place] [address]  Reticulocytes     Status: Abnormal   Collection Time: 05/21/24  8:43 AM  Result Value Ref Range   Retic Ct Pct 0.8 0.4 - 3.1 %   RBC. 3.27 (L) 3.87 - 5.11 MIL/uL   Retic Count, Absolute 27.1 19.0 - 186.0 K/uL   Immature Retic Fract 2.2 (L) 2.3 - 15.9 %    Comment: Performed at [facility], [address] [place], [place] [address]  Lactate dehydrogenase (LDH)     Status: None   Collection Time: 05/21/24  8:43 AM  Result Value Ref Range   LDH 145 98 - 192 U/L    Comment: Performed at [facility], [address] [place], [place] [address]      RADIOGRAPHY: No results found.     PATHOLOGY: None   ASSESSMENT/PLAN: Ms. Troiano is a very pleasant 68 yo African American female with chronic pancytopenia and anemia of chronic kidney disease.  CBC with diff and blood smear reviewed with Dr. Timmy. No abnormality or evidence of malignancy noted.  She is already receiving MIrcera every other week with dialysis.  Epo level and iron studies are pending. We will replace iron IV if needed.  Follow-up in 3 months.   All questions were answered. The patient knows to call the clinic with any problems, questions or concerns. We can  certainly see the patient much sooner if necessary.  The patient was discussed with Dr. Timmy and he is in agreement with the aforementioned.   Lauraine Pepper, NP

## 2024-05-22 LAB — ERYTHROPOIETIN: Erythropoietin: 7.2 m[IU]/mL (ref 2.6–18.5)

## 2024-05-23 NOTE — Unmapped (Signed)
 Pt lvm to see if this tpa had rescheduled her yet following early call.    Called pt back but unable to lvm bc mailbox full to tell that that this tpa booked 8/7 appt since only one until November for provider and can be rescheduled if it doesn't work. Unable to send message bc pt does not have mychart.

## 2024-05-27 DIAGNOSIS — Z944 Liver transplant status: Principal | ICD-10-CM

## 2024-05-27 DIAGNOSIS — Z796 Long term current use of immunosuppressive drug: Principal | ICD-10-CM

## 2024-05-29 NOTE — Unmapped (Signed)
 Yesterday called pt again re 8/7 appt. Unable to lvm since mailbox full. Left detailed vm for daughter re appt and attempting to reach pt re appt since she had called wanting to reschedule. Asked for call back either from her or pt and left contact number. Attempted to call brother listed under contacts, but the phone number belongs to someone else now. 2nd call    Yesterday sent secure chat to primary coord re when to cancel appt if unable to reach pt. She responded tomorrow (which is today).    Today cancelled appt early afternoon after checking with provider as well. Called pt's number and unable to lvm. Left detailed vm re cancellation on daughter's vm. Asked for call back from her or pt and left contact number. 3rd call

## 2024-05-30 ENCOUNTER — Ambulatory Visit: Admit: 2024-05-30 | Discharge: 2024-05-31 | Payer: Medicare (Managed Care)

## 2024-05-30 DIAGNOSIS — Z944 Liver transplant status: Principal | ICD-10-CM

## 2024-05-30 DIAGNOSIS — Z796 Long term current use of immunosuppressive drug: Principal | ICD-10-CM

## 2024-05-30 NOTE — Unmapped (Signed)
 FOLLOW UP ANNUAL LIVER CLINIC NOTE     Patient Name: Katherine Norton  Medical Record Number: [id number]  Date of Service: 05/30/2024    Referring Physician: [facility]  Current complaint: Follow up Annual Liver    Assessment/Plan:     Reatha Sur is a 68 y.o. 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 Recent LFT testing/images WNL.  Immunosuppressive monotherapy goal (40-80). Continue neoral  50mg  BID.    Hoping to be find a donor kidney transplant.    Today we discussed nutrition at length as a means of preventing frailty.     Left without getting labs drawn.    HEALTH MAINTENANCE:   Immunization History   Administered Date(s) Administered    COVID-19 VAC,BIVALENT,MODERNA(BLUE CAP) 07/03/2021    COVID-19 VACCINE,MRNA(MODERNA)(PF) 12/24/2019, 01/21/2020, 09/11/2020, 03/13/2021    HEPATITIS B VACCINE ADULT, ADJUVANTED, IM(HEPLISAV B) 12/11/2020, 01/06/2021, 02/12/2021, 04/09/2021    HEPATITIS B VACCINE ADULT,IM(ENERGIX B, RECOMBIVAX) 08/24/2018, 09/24/2018, 10/26/2018, 02/22/2019, 05/22/2019, 06/21/2019, 07/19/2019, 11/22/2019    INFLUENZA QUAD HIGH DOSE 39YRS+(FLUZONE) 08/21/2020    Influenza Vaccine Quad(IM)6 MO-Adult(PF) 07/06/2017, 07/26/2019    Influenza Virus Vaccine, unspecified formulation 07/01/2015, 08/22/2016, 07/06/2017, 07/26/2019, 08/07/2021    PNEUMOCOCCAL POLYSACCHARIDE 23-VALENT 08/22/2018, 09/03/2018    Pneumococcal Conjugate 13-Valent 07/06/2017    SHINGRIX -ZOSTER VACCINE (HZV),RECOMBINANT,ADJUVANTED(IM) 01/15/2018, 05/14/2020   Shingrix  vaccine series completed    Return to clinic: 1 year  Labs: Q 6-8 weeks    I personally spent 35 minutes face-to-face and non-face-to-face in the care of this patient, which includes all pre, intra, and post visit time on the date of service. Greater than 50% of the time was spent on counseling and the substance of the discussion.      Subjective:     HPI: Katherine Norton is a 68 y.o. 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 She returns today for her annual follow up. 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 She currently dialyizes via L upper arm AVF and doesn't make urine. She is tolerating HD well and has few changes to health over the last year but reports ongoing weight loss.    Denies fever, chills, arthralgias, and fatigue. Denies chest pain, SOB, N/V/D, constipation or abdominal pain. No acute complaint today.    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    PR TRACHEOSTOMY, PLANNED Midline 06/26/2018    Procedure: TRACHEOSTOMY PLANNED (SEPART PROC);  Surgeon: Almeda Ozell Minder, MD;  Location: MAIN OR [facility];  Service: ENT    TONSILLECTOMY         Family History   Problem Relation Age of Onset    Hypertension Mother     Hypertension Father     Prostate cancer Father     Lupus Sister     Lupus Brother     Kidney disease Neg Hx        Social History     Socioeconomic History  Marital status: Single   Tobacco Use    Smoking status: Never     Passive exposure: Never    Smokeless tobacco: Never   Substance and Sexual Activity    Alcohol use: No     Alcohol/week: 0.0 standard drinks of alcohol    Drug use: No     Social Drivers of Health     Food Insecurity: No Food Insecurity (05/21/2024)    Received from [facility]    Hunger Vital Sign     Within the past 12 months, you worried that your food would run out before you got the money to buy more.: Never true     Within the past 12 months, the food you bought just didn't last and you didn't have money to get more.: Never true   Transportation Needs: No Transportation Needs (05/21/2024)    Received from [facility] - Transportation     In the past 12 months, has lack of transportation kept you from medical appointments or from getting medications?: No     In the past 12 months, has lack of transportation kept you from meetings, work, or from getting things needed for daily living?: No       REVIEW OF SYSTEMS:   The balance of 10/12 systems is negative with the exception of HPI.    Objective:     MEDICATIONS:  Allergies   Allergen Reactions    Iodinated Contrast Media     Ioxaglate Sodium     Latex     Succinylcholine Chloride      Other reaction(s): Other (See Comments)       Current Outpatient Medications   Medication Sig Dispense Refill    acetaminophen  (TYLENOL ) 325 MG tablet 2 tablets (650 mg total) by G-tube route every six (6) hours as needed.  0    albuterol  2.5 mg/0.5 mL nebulizer solution Inhale 0.5 mL (2.5 mg total) by nebulization every six (6) hours as needed for wheezing or shortness of breath. 30 each 12    allopurinoL  (ZYLOPRIM ) 100 MG tablet Take 1 tablet (100 mg total) by mouth daily as needed.      amLODIPine (NORVASC) 5 MG tablet Take 1 tablet (5 mg total) by mouth in the morning.      calcitonin, salmon, (MIACALCIN) 200 unit/actuation nasal spray 1 spray into alternating nostrils in the morning.      cetirizine  (ZYRTEC ) 5 MG tablet TAKE 1 (ONE) TABLET BY MOUTH DAILY, MAX DAILY DOSE: 1 TABLET      cycloSPORINE  modified (NEORAL ) 25 MG capsule Take 2 capsules (50 mg total) by mouth two (2) times a day. 360 capsule 3    diclofenac sodium (VOLTAREN) 1 % gel Apply 2 g topically in the morning.      fluticasone  propionate (FLONASE ) 50 mcg/actuation nasal spray 1 spray into each nostril.      food supplemt, lactose-reduced (ENSURE) Liqd Take 120 mL by mouth.      gentamicin  1 mg/mL, sodium citrate  4% Gent locks with dialysis 2.4 mL 4    levETIRAcetam  (KEPPRA ) 500 mg/100 mL PgBk 1 tablet. lipase/protease/amylase (PANCRELIPASE  16,000 ORAL) Pancrelipase  24000-76000      loratadine  (CLARITIN ) 10 mg tablet Take 1 tablet (10 mg total) by mouth daily as needed.      metoprolol  succinate (TOPROL -XL) 50 MG 24 hr tablet 1 tablet (50 mg total) daily.      ondansetron  (ZOFRAN ) 4 MG tablet TAKE 1 TABLET BY MOUTH EVERY EIGHT  HOURS AS NEEDED NAUSEA/VOMITING      oxyCODONE  (ROXICODONE ) 5 MG immediate release tablet 1 tablet (5 mg total) by G-tube route every six (6) hours as needed. for up to 5 days 10 tablet 0    vitamin B complx-C-FA-zinc  cit (DIALYVITE  800 WITH ZINC  15) 0.8-15 mg Tab Take by mouth.      vitamin E , dl, acetate, 22.5 mg (50 unit)/mL Drop Take 1 capsule by mouth.      heparin  sodium,porcine (HEPARIN , PORCINE,) 1,000 unit/mL 1000 unit/mL injection This heparin  is for dialysis use ONLY. RN if you are unable to aspirate from both lumes, call provider piror to using the catheter. (Patient not taking: Reported on 05/30/2024) 1 mL 0    heparin  sodium,porcine (HEPARIN , PORCINE,) 5,000 unit/mL injection Inject 1 mL (5,000 Units total) under the skin every eight (8) hours. (Patient not taking: Reported on 05/30/2024) 1 mL 0     No current facility-administered medications for this visit.         PHYSICAL EXAM:  BP 129/74  - Pulse 68  - Temp 36.9 ??C (98.5 ??F) (Temporal)  - Ht 152.4 cm (5')  - Wt 52.6 kg (116 lb)  - SpO2 99%  - BMI 22.65 kg/m??       Wt Readings from Last 12 Encounters:   05/30/24 52.6 kg (116 lb)   05/11/23 51.2 kg (112 lb 12.8 oz)   04/06/23 52 kg (114 lb 9.6 oz)   05/12/22 49.6 kg (109 lb 6.4 oz)   12/09/21 50 kg (110 lb 4.8 oz)   05/20/21 53.9 kg (118 lb 12.8 oz)   04/13/21 54.7 kg (120 lb 9.6 oz)   01/12/21 55.3 kg (122 lb)   07/16/20 52.2 kg (115 lb)   05/14/20 51.8 kg (114 lb 3.2 oz)   03/19/20 49.8 kg (109 lb 12.8 oz)   05/02/19 57.2 kg (126 lb)        General Appearance:  NAD, well appearing but thin   HEENT:  Miles/AT. Well hydrated moist mucous membranes of the oral cavity. No scleral icterus. No cervical lymphadenopathy.   Pulmonary:    Normal respiratory effort. CTAB, without wheezes/crackles/rhonchi. Good air movement.    Cardiovascular:  Regular rate and rhythm, no murmur noted.   Extremities No edema. Ecchymosis to LUE with swelling to LUE above AVF   Abdomen:   Normoactive bowel sounds, abdomen soft, non-tender and not distended, no Hepatosplenomegaly or masses. Abdominal scar well healed without hernia.    Musculoskeletal: No joint tenderness, full ROM. Normal gait.    Skin: Skin color, texture, turgor normal, no rashes or lesions.   Neurologic: Grossly intact.   Psychiatric: Judgement and insight appropriate.        LAB RESULTS:        IMAGING Personally reviewed   CT Abdomen Pelvis Wo Contrast  Result Date: 05/14/2020  -Extensive aortobiiliac calcifications. Mild calcifications of the distal external iliac vasculature. Scattered calcifications of the common femoral arteries and internal iliac bilaterally.   -Sequelae of liver transplantation. Diffuse pneumobilia.   -Small atrophic native kidneys compatible with chronic medical renal disease. Nonobstructing left nephrolithiasis.     XR Chest 2 views  Result Date: 05/14/2020  No acute findings.    US  Liver Transplant  Result Date: 05/14/2020  --Patent hepatic transplant vasculature.   --Resistive indices within the common hepatic arteries are stable to slightly increased compared to prior, within normal limits. Recommend attention on follow-up.   --Decreased phasicity of the hepatic veins and  IVC, findings may be related to venoocclusive disease however favor technical issues.   --The transplant liver is mildly enlarged and demonstrates increased echogenicity.

## 2024-05-31 DIAGNOSIS — Z796 Long-term use of immunosuppressant medication: Principal | ICD-10-CM

## 2024-05-31 DIAGNOSIS — Z944 Liver transplant status: Principal | ICD-10-CM

## 2024-05-31 MED ORDER — CYCLOSPORINE MODIFIED 25 MG CAPSULE
ORAL_CAPSULE | Freq: Two times a day (BID) | ORAL | 3 refills | 90.00000 days | Status: CP
Start: 2024-05-31 — End: 2025-05-31
  Filled 2024-06-10: qty 360, 90d supply, fill #0

## 2024-05-31 NOTE — Unmapped (Signed)
 [facility]OO Post Liver Transplant Follow-Up     Transplant Date: 05/15/1995 (Liver)    Pt seen by ANP Levan Baron at [facility] for annual post liver txp follow up.    Placed standing orders to [facility].  Placed CsA script to [facility].    Request placed on checklist for TPA to schedule an annual appointment with ANP Metheny in one year at [facility].

## 2024-06-04 NOTE — Unmapped (Signed)
 The [facility] has made a second and final attempt to reach this patient to refill the following medication:cycloSPORINE  modified 25 MG capsule (Neoral ).      We have sent a text message to the following phone numbers: [phone].    Dates contacted: 05/29/2024 and 06/04/2024  Last scheduled delivery: 03/14/2024    The patient may be at risk of non-compliance with this medication. The patient should call the [facility] at [phone]  Option 4, then Option 4: Infectious Disease, Transplant to refill medication.    Katherine Norton [facility]

## 2024-06-06 ENCOUNTER — Ambulatory Visit (HOSPITAL_BASED_OUTPATIENT_CLINIC_OR_DEPARTMENT_OTHER)
Admission: RE | Admit: 2024-06-06 | Discharge: 2024-06-06 | Disposition: A | Discharge status: Home / Self Care | Source: Ambulatory Visit | Attending: Physician Assistant | Admitting: Physician Assistant

## 2024-06-06 ENCOUNTER — Encounter (HOSPITAL_BASED_OUTPATIENT_CLINIC_OR_DEPARTMENT_OTHER): Payer: Self-pay

## 2024-06-06 DIAGNOSIS — Z1231 Encounter for screening mammogram for malignant neoplasm of breast: Secondary | ICD-10-CM | POA: Insufficient documentation

## 2024-06-07 NOTE — Unmapped (Signed)
 06/07/2024 - Clinical assessment date was due on 08/18/2024. Reached out to [facility]  and got approval to proceed with scheduling the patients refill and to move the clinical assessment date to match the next refill coordination.     [facility] Refill Coordination Note    Specialty Medication(s) to be Shipped:   Transplant: cyclosporine  25mg     Other medication(s) to be shipped: No additional medications requested for fill at this time    Specialty Medications not needed at this time: N/A     Katherine Norton, DOB: 1956/03/29  Phone: [phone] (home)       All above HIPAA information was verified with patient.     Was a Nurse, learning disability used for this call? No    Completed refill call assessment today to schedule patient's medication shipment from the [facility]  [phone]).  All relevant notes have been reviewed.     Specialty medication(s) and dose(s) confirmed: Regimen is correct and unchanged.   Changes to medications: Katherine Norton reports no changes at this time.  Changes to insurance: No  New side effects reported not previously addressed with a pharmacist or physician: None reported  Questions for the pharmacist: No    Confirmed patient received a Conservation officer, historic buildings and a Surveyor, mining with first shipment. The patient will receive a drug information handout for each medication shipped and additional FDA Medication Guides as required.       DISEASE/MEDICATION-SPECIFIC INFORMATION        N/A    SPECIALTY MEDICATION ADHERENCE     Medication Adherence    Patient reported X missed doses in the last month: 0  Specialty Medication: cycloSPORINE  modified 25 MG capsule (Neoral )  Patient is on additional specialty medications: No              Were doses missed due to medication being on hold? No      cycloSPORINE  modified 25 MG capsule (Neoral ): 7 days of medicine on hand       REFERRAL TO PHARMACIST     Referral to the pharmacist: Not needed      [facility]     Shipping address confirmed in Epic.     Cost and Payment: Patient has a $0 copay, payment information is not required.    Delivery Scheduled: Yes, Expected medication delivery date: 06/11/2024.     Medication will be delivered via UPS to the prescription address in Epic WAM.    [facility]  Specialty Technician

## 2024-06-10 ENCOUNTER — Other Ambulatory Visit: Payer: Self-pay | Admitting: Physician Assistant

## 2024-06-10 DIAGNOSIS — R928 Other abnormal and inconclusive findings on diagnostic imaging of breast: Secondary | ICD-10-CM

## 2024-06-17 NOTE — Unmapped (Signed)
 Placed call to Pt to remind her to repeat labs, no answer, left vm.

## 2024-06-18 ENCOUNTER — Other Ambulatory Visit

## 2024-06-18 ENCOUNTER — Encounter

## 2024-06-19 LAB — CBC W/ DIFFERENTIAL
BANDED NEUTROPHILS ABSOLUTE COUNT: 0 x10E3/uL (ref 0.0–0.1)
BASOPHILS ABSOLUTE COUNT: 0 x10E3/uL (ref 0.0–0.2)
BASOPHILS RELATIVE PERCENT: 0 %
EOSINOPHILS ABSOLUTE COUNT: 0.1 x10E3/uL (ref 0.0–0.4)
EOSINOPHILS RELATIVE PERCENT: 2 %
HEMATOCRIT: 34.2 % (ref 34.0–46.6)
HEMOGLOBIN: 11 g/dL — ABNORMAL LOW (ref 11.1–15.9)
IMMATURE GRANULOCYTES: 0 %
LYMPHOCYTES ABSOLUTE COUNT: 0.5 x10E3/uL — ABNORMAL LOW (ref 0.7–3.1)
LYMPHOCYTES RELATIVE PERCENT: 20 %
MEAN CORPUSCULAR HEMOGLOBIN CONC: 32.2 g/dL (ref 31.5–35.7)
MEAN CORPUSCULAR HEMOGLOBIN: 31.2 pg (ref 26.6–33.0)
MEAN CORPUSCULAR VOLUME: 97 fL (ref 79–97)
MONOCYTES ABSOLUTE COUNT: 0.3 x10E3/uL (ref 0.1–0.9)
MONOCYTES RELATIVE PERCENT: 10 %
NEUTROPHILS ABSOLUTE COUNT: 1.8 x10E3/uL (ref 1.4–7.0)
NEUTROPHILS RELATIVE PERCENT: 68 %
PLATELET COUNT: 126 x10E3/uL — ABNORMAL LOW (ref 150–450)
RED BLOOD CELL COUNT: 3.53 x10E6/uL — ABNORMAL LOW (ref 3.77–5.28)
RED CELL DISTRIBUTION WIDTH: 13.1 % (ref 11.7–15.4)
WHITE BLOOD CELL COUNT: 2.6 x10E3/uL — ABNORMAL LOW (ref 3.4–10.8)

## 2024-06-19 LAB — MAGNESIUM: MAGNESIUM: 2.1 mg/dL (ref 1.6–2.3)

## 2024-06-19 LAB — COMPREHENSIVE METABOLIC PANEL
ALBUMIN: 3.9 g/dL (ref 3.9–4.9)
ALKALINE PHOSPHATASE: 108 IU/L (ref 44–121)
ALT (SGPT): 15 IU/L (ref 0–32)
AST (SGOT): 25 IU/L (ref 0–40)
BILIRUBIN TOTAL (MG/DL) IN SER/PLAS: 0.6 mg/dL (ref 0.0–1.2)
BLOOD UREA NITROGEN: 25 mg/dL (ref 8–27)
BUN / CREAT RATIO: 4 — ABNORMAL LOW (ref 12–28)
CALCIUM: 9.3 mg/dL (ref 8.7–10.3)
CHLORIDE: 94 mmol/L — ABNORMAL LOW (ref 96–106)
CO2: 23 mmol/L (ref 20–29)
CREATININE: 6.85 mg/dL — ABNORMAL HIGH (ref 0.57–1.00)
EGFR: 6 mL/min/1.73 — ABNORMAL LOW
GLOBULIN, TOTAL: 4.3 g/dL (ref 1.5–4.5)
GLUCOSE: 77 mg/dL (ref 70–99)
POTASSIUM: 4.4 mmol/L (ref 3.5–5.2)
SODIUM: 138 mmol/L (ref 134–144)
TOTAL PROTEIN: 8.2 g/dL (ref 6.0–8.5)

## 2024-06-19 LAB — PHOSPHORUS: PHOSPHORUS, SERUM: 6.7 mg/dL — ABNORMAL HIGH (ref 3.0–4.3)

## 2024-06-19 LAB — GAMMA GT: GAMMA GLUTAMYL TRANSFERASE: 12 IU/L (ref 0–60)

## 2024-06-19 LAB — BILIRUBIN, DIRECT: BILIRUBIN DIRECT: 0.15 mg/dL (ref 0.00–0.40)

## 2024-06-21 LAB — CYCLOSPORINE LEVEL: CYCLOSPORINE, LC/MS: 33 ng/mL — ABNORMAL LOW (ref 100–400)

## 2024-06-21 NOTE — Unmapped (Signed)
 Lab results from 06/18/24 were reviewed by Levan Baron ANP as seen in Epic. No changes recommended at this time. Pt to repeat labs in November.    Messaged Pt in MyChart.

## 2024-06-24 DIAGNOSIS — Z944 Liver transplant status: Principal | ICD-10-CM

## 2024-06-24 DIAGNOSIS — Z796 Long term current use of immunosuppressive drug: Principal | ICD-10-CM

## 2024-06-25 ENCOUNTER — Other Ambulatory Visit

## 2024-06-25 ENCOUNTER — Encounter

## 2024-06-27 ENCOUNTER — Ambulatory Visit
Admission: RE | Admit: 2024-06-27 | Discharge: 2024-06-27 | Disposition: A | Discharge status: Home / Self Care | Source: Ambulatory Visit | Attending: Physician Assistant | Admitting: Physician Assistant

## 2024-06-27 DIAGNOSIS — R928 Other abnormal and inconclusive findings on diagnostic imaging of breast: Secondary | ICD-10-CM

## 2024-07-22 DIAGNOSIS — Z796 Long term current use of immunosuppressive drug: Principal | ICD-10-CM

## 2024-07-22 DIAGNOSIS — Z944 Liver transplant status: Principal | ICD-10-CM

## 2024-07-25 ENCOUNTER — Encounter (HOSPITAL_COMMUNITY)
Admission: RE | Disposition: A | Discharge status: Home / Self Care | Payer: Self-pay | Source: Home / Self Care | Attending: Surgery

## 2024-07-25 ENCOUNTER — Ambulatory Visit (HOSPITAL_COMMUNITY)
Admission: RE | Admit: 2024-07-25 | Discharge: 2024-07-25 | Disposition: A | Discharge status: Home / Self Care | Attending: Surgery | Admitting: Surgery

## 2024-07-25 DIAGNOSIS — Z992 Dependence on renal dialysis: Secondary | ICD-10-CM | POA: Insufficient documentation

## 2024-07-25 DIAGNOSIS — I120 Hypertensive chronic kidney disease with stage 5 chronic kidney disease or end stage renal disease: Secondary | ICD-10-CM | POA: Insufficient documentation

## 2024-07-25 DIAGNOSIS — T82858A Stenosis of vascular prosthetic devices, implants and grafts, initial encounter: Secondary | ICD-10-CM | POA: Diagnosis present

## 2024-07-25 DIAGNOSIS — Y832 Surgical operation with anastomosis, bypass or graft as the cause of abnormal reaction of the patient, or of later complication, without mention of misadventure at the time of the procedure: Secondary | ICD-10-CM | POA: Insufficient documentation

## 2024-07-25 DIAGNOSIS — N186 End stage renal disease: Secondary | ICD-10-CM | POA: Diagnosis not present

## 2024-07-25 DIAGNOSIS — T82898A Other specified complication of vascular prosthetic devices, implants and grafts, initial encounter: Secondary | ICD-10-CM

## 2024-07-25 HISTORY — PX: VENOUS ANGIOPLASTY: CATH118376

## 2024-07-25 HISTORY — PX: A/V FISTULAGRAM: CATH118298

## 2024-07-25 SURGERY — A/V FISTULAGRAM
Anesthesia: LOCAL | Site: Arm Upper | Laterality: Left

## 2024-07-25 MED ORDER — IODIXANOL 320 MG/ML IV SOLN
INTRAVENOUS | Status: DC | PRN
  Administered 2024-07-25: 20 mL

## 2024-07-25 MED ORDER — LIDOCAINE HCL (PF) 1 % IJ SOLN
INTRAMUSCULAR | Status: AC
  Filled 2024-07-25: qty 30

## 2024-07-25 MED ORDER — DIPHENHYDRAMINE HCL 50 MG/ML IJ SOLN
INTRAMUSCULAR | Status: DC | PRN
  Administered 2024-07-25: 50 mg via INTRAVENOUS

## 2024-07-25 MED ORDER — METHYLPREDNISOLONE SODIUM SUCC 125 MG IJ SOLR
INTRAMUSCULAR | Status: AC
  Filled 2024-07-25: qty 2

## 2024-07-25 MED ORDER — METHYLPREDNISOLONE SODIUM SUCC 125 MG IJ SOLR
INTRAMUSCULAR | Status: DC | PRN
  Administered 2024-07-25: 125 mg via INTRAVENOUS

## 2024-07-25 MED ORDER — LIDOCAINE HCL (PF) 1 % IJ SOLN
INTRAMUSCULAR | Status: DC | PRN
  Administered 2024-07-25: 2 mL via INTRADERMAL

## 2024-07-25 MED ORDER — HEPARIN (PORCINE) IN NACL 1000-0.9 UT/500ML-% IV SOLN
INTRAVENOUS | Status: DC | PRN
  Administered 2024-07-25: 500 mL

## 2024-07-25 MED ORDER — DIPHENHYDRAMINE HCL 50 MG/ML IJ SOLN
INTRAMUSCULAR | Status: AC
  Filled 2024-07-25: qty 1

## 2024-07-25 SURGICAL SUPPLY — 9 items
BALLOON MUSTANG 8X80X75 (BALLOONS) IMPLANT
KIT ENCORE 26 ADVANTAGE (KITS) IMPLANT
KIT MICROPUNCTURE NIT STIFF (SHEATH) IMPLANT
KIT PV (KITS) ×2 IMPLANT
SHEATH PINNACLE R/O II 7F 4CM (SHEATH) IMPLANT
SHEATH PROBE COVER 6X72 (BAG) IMPLANT
TRAY PV CATH (CUSTOM PROCEDURE TRAY) ×2 IMPLANT
TUBING CIL FLEX 10 FLL-RA (TUBING) IMPLANT
WIRE BENTSON .035X145CM (WIRE) IMPLANT

## 2024-07-25 NOTE — Op Note (Signed)
    Patient name: Tami [name] MRN: [id number] DOB: 10-01-1956 Sex: female  07/25/2024 Pre-operative Diagnosis: ESRD Post-operative diagnosis:  Same Surgeon:  Malvina New Procedure Performed:  1.  Ultrasound-guided access, left arm fistula  2.  Fistulogram  3.  Venoplasty, left cephalic vein (peripheral)   Indications: This is a 68 year old female having trouble with dialysis access.  She comes in today for fistulogram.  Procedure:  The patient was identified in the holding area and taken to room 8.  The patient was then placed supine on the table and prepped and draped in the usual sterile fashion.  Ultrasound was used to evaluate the fistula.  The vein was patent and compressible.  A digital ultrasound image was acquired.  The fistula was then accessed under ultrasound guidance using a micropuncture needle.  An 018 wire was then asvanced without resistance and a micropuncture sheath was placed.  Contrast injections were then performed through the sheath.  125 mg of Solu-Medrol and 25 mg of Benadryl  were given through the micropuncture sheath for her contrast allergy.  Findings: A stent is visualized near the arteriovenous anastomosis which is widely patent.  The fistula in the arm is patent with some ectasia/aneurysmal change.  The cephalic vein arch shows 60% stenosis and a change in luminal diameter.  The remaining central venous system is widely patent.   Intervention: After the above images were acquired the decision was made to proceed with intervention.  A 7 French sheath was placed over a Bentson wire.  I selected an 8 x 80 balloon to perform balloon venoplasty of the cephalic vein arch.  Completion imaging showed resolution of the stenosis.  Catheters and wires were removed.  The sheath was removed with a Monocryl suture for closure.  Impression:  #1  Successful venoplasty of the cephalic vein arch stenosis using an 8 mm balloon  #2  Widely patent stent at the arteriovenous  anastomosis  #3  No evidence of central venous stenosis  #4  Access remains amenable to percutaneous intervention   V. Malvina New, M.D., [facility] of [place] Office: [phone] Pager:  [phone]

## 2024-07-25 NOTE — H&P (Signed)
 V[facility] Specialist of [place]  Patient name: Tami [name] MRN: [id number] DOB: 07/06/1956 Sex: female   REASON FOR VISIT:    ESRD  HISOTRY OF PRESENT ILLNESS:    Tami [name] is a 68 y.o. female who is status post left brachiocephalic fistula on 10/02/2018 and branch ligation and elevation on 11/29/2018.  She had excision of a ulcer in 2023.  The skin was closed over top of the vein   PAST MEDICAL HISTORY:   Past Medical History:  Diagnosis Date   Anemia    Arthritis    ESRD (end stage renal disease) on dialysis (HCC)    MWF; [place] (10/04/2018)   Gout    Hepatitis, autoimmune (HCC) 12/08/2011   Hypertension    Seizures (HCC) 10/03/2018     FAMILY HISTORY:   Family History  Problem Relation Age of Onset   Stroke Mother    Cancer Father    Lupus Sister        1 sister   Hypertension Sister    Cancer Brother        prostate- 1 brother   Lupus Brother     SOCIAL HISTORY:   Social History   Tobacco Use   Smoking status: Never   Smokeless tobacco: Never   Tobacco comments:    never used tobacco  Substance Use Topics   Alcohol use: Not Currently    Alcohol/week: 0.0 standard drinks of alcohol     ALLERGIES:   Allergies  Allergen Reactions   Iodinated Contrast Media Other (See Comments)    UNSPECIFIED REACTION rash    Ioxaglate     UNSPECIFIED REACTION    Latex     UNSPECIFIED REACTION    Quelicin  [Succinylcholine Chloride] Other (See Comments)    Unknown reaction     CURRENT MEDICATIONS:   No current facility-administered medications for this encounter.    REVIEW OF SYSTEMS:   [X]  denotes positive finding, [ ]  denotes negative finding Cardiac  Comments:  Chest pain or chest pressure:    Shortness of breath upon exertion:    Short of breath when lying flat:    Irregular heart rhythm:        Vascular    Pain in calf, thigh, or hip brought on by ambulation:    Pain in feet at night  that wakes you up from your sleep:     Blood clot in your veins:    Leg swelling:         Pulmonary    Oxygen at home:    Productive cough:     Wheezing:         Neurologic    Sudden weakness in arms or legs:     Sudden numbness in arms or legs:     Sudden onset of difficulty speaking or slurred speech:    Temporary loss of vision in one eye:     Problems with dizziness:         Gastrointestinal    Blood in stool:     Vomited blood:         Genitourinary    Burning when urinating:     Blood in urine:        Psychiatric    Major depression:         Hematologic    Bleeding problems:    Problems with blood clotting too easily:        Skin    Rashes or  ulcers:        Constitutional    Fever or chills:      PHYSICAL EXAM:   Vitals:   07/25/24 0843  BP: (!) 140/67  Pulse: 72  Resp: 12  Temp: 97.7 F (36.5 C)  TempSrc: Oral  SpO2: 95%    GENERAL: The patient is a well-nourished female, in no acute distress. The vital signs are documented above. CARDIAC: There is a regular rate and rhythm.  VASCULAR: Palpable thrill over top of fistula PULMONARY: Non-labored respirations MUSCULOSKELETAL: There are no major deformities or cyanosis. NEUROLOGIC: No focal weakness or paresthesias are detected. SKIN: There are no ulcers or rashes noted. PSYCHIATRIC: The patient has a normal affect.  STUDIES:     MEDICAL ISSUES:   ESRD: I discussed proceeding with fistulogram and intervention as indicated.  Details of the procedure were discussed with the patient and she wishes to proceed.  All questions answered.    Malvina Serene CLORE, MD, FACS [facility] Specialists of [facility] [phone] Pager [phone]

## 2024-07-25 NOTE — Progress Notes (Signed)
 Patient says she will take she will take blood products

## 2024-07-29 ENCOUNTER — Encounter (HOSPITAL_COMMUNITY): Payer: Self-pay | Admitting: Surgery

## 2024-08-13 ENCOUNTER — Encounter (HOSPITAL_COMMUNITY): Payer: Self-pay

## 2024-08-14 ENCOUNTER — Encounter (HOSPITAL_COMMUNITY): Admission: RE | Disposition: A | Payer: Self-pay | Source: Home / Self Care | Attending: Vascular Surgery

## 2024-08-14 ENCOUNTER — Other Ambulatory Visit: Payer: Self-pay

## 2024-08-14 ENCOUNTER — Ambulatory Visit (HOSPITAL_COMMUNITY)
Admission: RE | Admit: 2024-08-14 | Discharge: 2024-08-14 | Disposition: A | Discharge status: Home / Self Care | Attending: Vascular Surgery | Admitting: Vascular Surgery

## 2024-08-14 ENCOUNTER — Encounter (HOSPITAL_COMMUNITY): Payer: Self-pay | Admitting: Vascular Surgery

## 2024-08-14 DIAGNOSIS — Y832 Surgical operation with anastomosis, bypass or graft as the cause of abnormal reaction of the patient, or of later complication, without mention of misadventure at the time of the procedure: Secondary | ICD-10-CM | POA: Insufficient documentation

## 2024-08-14 DIAGNOSIS — T82510A Breakdown (mechanical) of surgically created arteriovenous fistula, initial encounter: Secondary | ICD-10-CM | POA: Insufficient documentation

## 2024-08-14 DIAGNOSIS — Z992 Dependence on renal dialysis: Secondary | ICD-10-CM | POA: Diagnosis not present

## 2024-08-14 DIAGNOSIS — I120 Hypertensive chronic kidney disease with stage 5 chronic kidney disease or end stage renal disease: Secondary | ICD-10-CM | POA: Insufficient documentation

## 2024-08-14 DIAGNOSIS — N186 End stage renal disease: Secondary | ICD-10-CM | POA: Diagnosis not present

## 2024-08-14 HISTORY — PX: A/V FISTULAGRAM: CATH118298

## 2024-08-14 SURGERY — A/V FISTULAGRAM
Anesthesia: LOCAL | Site: Arm Upper | Laterality: Left

## 2024-08-14 MED ORDER — IODIXANOL 320 MG/ML IV SOLN
INTRAVENOUS | Status: DC | PRN
  Administered 2024-08-14: 20 mL via INTRAVENOUS

## 2024-08-14 MED ORDER — METHYLPREDNISOLONE SODIUM SUCC 125 MG IJ SOLR
INTRAMUSCULAR | Status: AC
  Filled 2024-08-14: qty 2

## 2024-08-14 MED ORDER — METHYLPREDNISOLONE SODIUM SUCC 125 MG IJ SOLR
INTRAMUSCULAR | Status: DC | PRN
  Administered 2024-08-14: 125 mg via INTRAVENOUS

## 2024-08-14 MED ORDER — DIPHENHYDRAMINE HCL 50 MG/ML IJ SOLN
INTRAMUSCULAR | Status: AC
  Filled 2024-08-14: qty 1

## 2024-08-14 MED ORDER — LIDOCAINE HCL (PF) 1 % IJ SOLN
INTRAMUSCULAR | Status: AC
  Filled 2024-08-14: qty 30

## 2024-08-14 MED ORDER — LIDOCAINE HCL (PF) 1 % IJ SOLN
INTRAMUSCULAR | Status: DC | PRN
  Administered 2024-08-14: 5 mL

## 2024-08-14 MED ORDER — HEPARIN (PORCINE) IN NACL 1000-0.9 UT/500ML-% IV SOLN
INTRAVENOUS | Status: DC | PRN
  Administered 2024-08-14: 500 mL

## 2024-08-14 MED ORDER — DIPHENHYDRAMINE HCL 50 MG/ML IJ SOLN
INTRAMUSCULAR | Status: DC | PRN
  Administered 2024-08-14: 50 mg via INTRAVENOUS

## 2024-08-14 SURGICAL SUPPLY — 4 items
KIT MICROPUNCTURE NIT STIFF (SHEATH) IMPLANT
STOPCOCK MORSE 400PSI 3WAY (MISCELLANEOUS) IMPLANT
TRAY PV CATH (CUSTOM PROCEDURE TRAY) ×1 IMPLANT
TUBING CIL FLEX 10 FLL-RA (TUBING) IMPLANT

## 2024-08-14 NOTE — Op Note (Signed)
 DATE OF SERVICE: 08/14/2024  PATIENT:  Tami [name]  68 y.o. female  PRE-OPERATIVE DIAGNOSIS:  end-stage renal disease; malfunction AVF  POST-OPERATIVE DIAGNOSIS:  Same  PROCEDURE:   1) Ultrasound guided left arm AVF access (CPT [phone]) 2) Left arm fistulagram (CPT [phone]) 3) established outpatient evaluation and management - level 3 (CPT 99213)  SURGEON:  Debby SAILOR. Magda, MD  ASSISTANT: none  ANESTHESIA:   local  ESTIMATED BLOOD LOSS: min  LOCAL MEDICATIONS USED:  LIDOCAINE    COUNTS: confirmed correct.  PATIENT DISPOSITION:  PACU - hemodynamically stable.   Delay start of Pharmacological VTE agent (>24hrs) due to surgical blood loss or risk of bleeding: no  INDICATION FOR PROCEDURE: Tami [name] is a 67 y.o. female with ESRD on HD via left arm AV fistula. This is not working well at dialysis. After careful discussion of risks, benefits, and alternatives the patient was offered fistulagram. The patient understood and wished to proceed.  OPERATIVE FINDINGS:  Left Upper Extremity Central venous: no stenosis Subclavian vein: no stenosis Cephalic arch: smaller caliber vein than expected, but gradual change not typical of stenosis.  Fistula: No stenosis Anastomosis: prior stenting; widely patent  DESCRIPTION OF PROCEDURE: After identification of the patient in the pre-operative holding area, the patient was transferred to the operating room. The patient was positioned supine on the operating room table.  The left upper extremity was prepped and draped in standard fashion. A surgical pause was performed confirming correct patient, procedure, and operative location.  The left upper extremity was anesthetized with subcutaneous injection of 1% lidocaine  over the area of planned access. Using ultrasound guidance, the left upper extremity dialysis access was accessed with micropuncture technique.  Fistulogram was performed in stations with the micro sheath.  See above for  details.  All endovascular equipment was removed.  A figure-of-eight stitch was applied to the exit site with good hemostasis.  Sterile bandage was applied.  Upon completion of the case instrument and sharps counts were confirmed correct. The patient was transferred to the PACU in good condition. I was present for all portions of the procedure.  PLAN: fistula widely patent. No mechanical issues identified. OK to use fistula for dialysis. Follow up with me as needed.  Debby SAILOR. Magda, MD [facility] Vascular and Vein Specialists of [facility] Phone Number: [phone] 08/14/2024 11:25 AM

## 2024-08-14 NOTE — H&P (Signed)
 [facility]ASCULAR AND VEIN SPECIALISTS OF [place]  ASSESSMENT / PLAN: 68 y.o. female with malfunction of left arm AV fistula.  Plan for serum today to evaluate manage.  CHIEF COMPLAINT: Malfunction of fistula  HISTORY OF PRESENT ILLNESS: Tami [name] is a 67 y.o. female with end-stage renal disease on dialysis via left arm brachiocephalic AV fistula.  The fistula is not performing well at dialysis per her dialysis center.  She is referred for fistulogram to evaluate and manage.  She recently had a cephalic arch angioplasty in September.   Past Medical History:  Diagnosis Date   Anemia    Arthritis    ESRD (end stage renal disease) on dialysis (HCC)    MWF; [place] (10/04/2018)   Gout    Hepatitis, autoimmune (HCC) 12/08/2011   Hypertension    Seizures (HCC) 10/03/2018    Past Surgical History:  Procedure Laterality Date   A/V FISTULAGRAM Left 07/25/2024   Procedure: A/V Fistulagram;  Surgeon: Serene Gaile ORN, MD;  Location: [facility];  Service: Cardiovascular;  Laterality: Left;   ABDOMINAL HYSTERECTOMY     AV FISTULA PLACEMENT Left 10/02/2018   Procedure: Creation of left arm Brachiocephalic Fistula;  Surgeon: Sheree Penne Bruckner, MD;  Location[facility] OR;  Service: Vascular;  Laterality: Left;   FISTULA SUPERFICIALIZATION Left 11/29/2018   Procedure: TRANSLOCATION/FISTULA SUPERFICIALIZATION OF LEFT ARM FISTULA;  Surgeon: Gretta Bruckner PARAS, MD;  Location: [facility] OR;  Service: Vascular;  Laterality: Left;   LIVER TRANSPLANT     1996   REVISON OF ARTERIOVENOUS FISTULA Left 05/10/2022   Procedure: EXISION OF ULCER OVERLYING  ARTERIOVENOUS FISTULA;  Surgeon: Eliza Bruckner RAMAN, MD;  Location: [facility] OR;  Service: Vascular;  Laterality: Left;   TONSILLECTOMY     VENOUS ANGIOPLASTY  07/25/2024   Procedure: VENOUS ANGIOPLASTY;  Surgeon: Serene Gaile ORN, MD;  Location: [facility];  Service: Cardiovascular;;  cephalic arch    Family History  Problem Relation Age of Onset   Stroke  Mother    Cancer Father    Lupus Sister        1 sister   Hypertension Sister    Cancer Brother        prostate- 1 brother   Lupus Brother     Social History   Socioeconomic History   Marital status: Divorced    Spouse name: Not on file   Number of children: 2   Years of education: some college   Highest education level: Not on file  Occupational History    Comment: na  Tobacco Use   Smoking status: Never   Smokeless tobacco: Never   Tobacco comments:    never used tobacco  Vaping Use   Vaping status: Never Used  Substance and Sexual Activity   Alcohol use: Not Currently    Alcohol/week: 0.0 standard drinks of alcohol   Drug use: No   Sexual activity: Not on file  Other Topics Concern   Not on file  Social History Narrative   Lives with son   Caffeine - tea , little    Social Drivers of Corporate investment banker Strain: Not on file  Food Insecurity: No Food Insecurity (05/21/2024)   Hunger Vital Sign    Worried About Running Out of Food in the Last Year: Never true    Ran Out of Food in the Last Year: Never true  Transportation Needs: No Transportation Needs (05/21/2024)   PRAPARE - Administrator, Civil Service (Medical):  No    Lack of Transportation (Non-Medical): No  Physical Activity: Not on file  Stress: Not on file  Social Connections: Not on file  Intimate Partner Violence: Not At Risk (05/21/2024)   Humiliation, Afraid, Rape, and Kick questionnaire    Fear of Current or Ex-Partner: No    Emotionally Abused: No    Physically Abused: No    Sexually Abused: No    Allergies  Allergen Reactions   Iodinated Contrast Media Other (See Comments)    UNSPECIFIED REACTION rash    Ioxaglate     UNSPECIFIED REACTION    Latex     UNSPECIFIED REACTION    Quelicin  [Succinylcholine Chloride] Other (See Comments)    Unknown reaction    Current Facility-Administered Medications  Medication Dose Route Frequency Provider Last Rate Last Admin    diphenhydrAMINE  (BENADRYL ) injection    PRN Magda Debby SAILOR, MD   50 mg at 08/14/24 1106   Heparin  (Porcine) in NaCl 1000-0.9 UT/500ML-% SOLN    PRN Magda Debby SAILOR, MD   500 mL at 08/14/24 1107   iodixanol (VISIPAQUE) 320 MG/ML injection    PRN Magda Debby SAILOR, MD   20 mL at 08/14/24 1109   lidocaine  (PF) (XYLOCAINE ) 1 % injection    PRN Magda Debby SAILOR, MD   5 mL at 08/14/24 1107   methylPREDNISolone sodium succinate (SOLU-MEDROL) 125 mg/2 mL injection    PRN Magda Debby SAILOR, MD   125 mg at 08/14/24 1106    PHYSICAL EXAM Vitals:   08/14/24 0952 08/14/24 1013 08/14/24 1103 08/14/24 1122  BP: 135/72 117/68  (!) 157/78  Pulse: 81 72  71  Resp: 12 16  12   Temp: 98 F (36.7 C)     TempSrc: Oral     SpO2: 93% 95% 99% 95%   Elderly thin woman in no distress Regular rate and rhythm Unlabored breathing Left arm AV fistula with good thrill  PERTINENT LABORATORY AND RADIOLOGIC DATA  Most recent CBC    Latest Ref Rng & Units 05/21/2024    8:42 AM 05/10/2022    8:42 AM 02/20/2020    2:11 PM  CBC  WBC 4.0 - 10.5 K/uL 2.7   4.5   Hemoglobin 12.0 - 15.0 g/dL 89.4  88.3  89.4   Hematocrit 36.0 - 46.0 % 31.4  34.0  32.8   Platelets 150 - 400 K/uL 130   106      Most recent CMP    Latest Ref Rng & Units 05/21/2024    8:42 AM 05/10/2022    8:42 AM 02/20/2020    2:11 PM  CMP  Glucose 70 - 99 mg/dL 85  77  883   BUN 8 - 23 mg/dL 30  34  13   Creatinine 0.44 - 1.00 mg/dL 3.70  5.19  5.99   Sodium 135 - 145 mmol/L 137  135  137   Potassium 3.5 - 5.1 mmol/L 4.4  5.6  2.8   Chloride 98 - 111 mmol/L 94  97  92   CO2 22 - 32 mmol/L 26   32   Calcium  8.9 - 10.3 mg/dL 9.1   9.3   Total Protein 6.5 - 8.1 g/dL 8.5   7.9   Total Bilirubin 0.0 - 1.2 mg/dL 0.8   0.5   Alkaline Phos 38 - 126 U/L 95   71   AST 15 - 41 U/L 29   15   ALT 0 - 44 U/L 14  5     Renal function CrCl cannot be calculated (Patient's most recent lab result is older than the maximum 21 days allowed.).  No results  found for: HGBA1C  LDL Cholesterol  Date Value Ref Range Status  10/03/2018 73 0 - 99 mg/dL Final    Comment:           Total Cholesterol/HDL:CHD Risk Coronary Heart Disease Risk Table                     Men   Women  1/2 Average Risk   3.4   3.3  Average Risk       5.0   4.4  2 X Average Risk   9.6   7.1  3 X Average Risk  23.4   11.0        Use the calculated Patient Ratio above and the CHD Risk Table to determine the patient's CHD Risk.        ATP III CLASSIFICATION (LDL):  <100     mg/dL   Optimal  899-870  mg/dL   Near or Above                    Optimal  130-159  mg/dL   Borderline  839-810  mg/dL   High  >809     mg/dL   Very High Performed at [facility], [address] [place], [place] [address]     Debby SAILOR. Magda, MD FACS [facility] and [facility]in Specialists of [facility] Phone Number: [phone] 08/14/2024 11:23 AM   Total time spent on preparing this encounter including chart review, data review, collecting history, examining the patient, and coordinating care: 30 minutes  Portions of this report may have been transcribed using voice recognition software.  Every effort has been made to ensure accuracy; however, inadvertent computerized transcription errors may still be present.

## 2024-08-16 NOTE — Telephone Encounter (Signed)
 1st attempt. Tried calling pt to reschedule appointment. VM full.

## 2024-08-19 DIAGNOSIS — Z944 Liver transplant status: Principal | ICD-10-CM

## 2024-08-19 DIAGNOSIS — Z796 Long term current use of immunosuppressive drug: Principal | ICD-10-CM

## 2024-08-20 ENCOUNTER — Ambulatory Visit: Admitting: Family

## 2024-08-20 ENCOUNTER — Inpatient Hospital Stay: Attending: Family

## 2024-08-26 NOTE — Progress Notes (Signed)
 [facility] Clinical Assessment & Refill Coordination Note    Katherine Norton, DOB: 12/27/1955  Phone: [phone] (home)     All above HIPAA information was verified with patient.     Was a nurse, learning disability used for this call? No    Specialty Medication(s):   Transplant: cyclosporine  25mg      Current Medications[1]     Changes to medications: Selyna reports no changes at this time.    Medication list has been reviewed and updated in Epic: Yes  2 high priority alerts noted on med rec:  Cyclo/gent - clinic aware  2 heparins - pt says no longer on heparin , messaged clinic to dc    Allergies[2]    Changes to allergies: No    Allergies have been reviewed and updated in Epic: Yes    SPECIALTY MEDICATION ADHERENCE     Cyclosporine  25mg   : 12 days of medicine on hand       Medication Adherence    Patient reported X missed doses in the last month: 0  Specialty Medication: cyclosporine  25mg   Patient is on additional specialty medications: No          Specialty medication(s) dose(s) confirmed: Regimen is correct and unchanged.     Are there any concerns with adherence? No    Adherence counseling provided? Not needed    CLINICAL MANAGEMENT AND INTERVENTION      Clinical Benefit Assessment:    Do you feel the medicine is effective or helping your condition? Yes    Clinical Benefit counseling provided? Not needed    Adverse Effects Assessment:    Are you experiencing any side effects? No    Are you experiencing difficulty administering your medicine? No    Quality of Life Assessment:    Quality of Life    Rheumatology  Oncology  Dermatology  Cystic Fibrosis          How many days over the past month did your transplant  keep you from your normal activities? For example, brushing your teeth or getting up in the morning. 0    Have you discussed this with your provider? Not needed    Acute Infection Status:    Acute infections noted within Epic:  No active infections    Patient reported infection: None    Therapy Appropriateness:    Is the medication and dose appropriate considering the patient???s diagnosis, treatment, and disease journey, comorbidities, medical history, current medications, allergies, therapeutic goals, self-administration ability, and access barriers? Yes, therapy is appropriate and should be continued     Clinical Intervention:    Was an intervention completed as part of this clinical assessment? No    DISEASE/MEDICATION-SPECIFIC INFORMATION      N/A    Solid Organ Transplant: Not Applicable    PATIENT SPECIFIC NEEDS     Does the patient have any physical, cognitive, or cultural barriers? No    Is the patient high risk? No    Does the patient require physician intervention or other additional services (i.e., nutrition, smoking cessation, social work)? No    Does the patient have an additional or emergency contact listed in their chart? Yes    SOCIAL DETERMINANTS OF HEALTH     At the [facility], we have learned that life circumstances - like trouble affording food, housing, utilities, or transportation can affect the health of many of our patients.   That is why we wanted to ask: are you currently experiencing  any life circumstances that are negatively impacting your health and/or quality of life? Patient declined to answer    Social Drivers of Health     Food Insecurity: No Food Insecurity (05/21/2024)    Received from [facility]    Hunger Vital Sign     Within the past 12 months, you worried that your food would run out before you got the money to buy more.: Never true     Within the past 12 months, the food you bought just didn't last and you didn't have money to get more.: Never true   Tobacco Use: Low Risk  (05/30/2024)    Patient History     Smoking Tobacco Use: Never     Smokeless Tobacco Use: Never     Passive Exposure: Never   Transportation Needs: No Transportation Needs (05/21/2024)    Received from [facility] - Transportation     In the past 12 months, has lack of transportation kept you from medical appointments or from getting medications?: No     In the past 12 months, has lack of transportation kept you from meetings, work, or from getting things needed for daily living?: No   Alcohol Use: Not on file   Housing: Not on file   Physical Activity: Not on file   Utilities: Not At Risk (05/21/2024)    Received from [facility] Utilities     In the past 12 months has the electric, gas, oil, or water company threatened to shut off services in your home?: No   Stress: Not on file   Interpersonal Safety: Not on file   Substance Use: Not on file (09/03/2023)   Intimate Partner Violence: Not At Risk (05/21/2024)    Received from [facility]    Humiliation, Afraid, Rape, and Kick questionnaire     Within the last year, have you been afraid of your partner or ex-partner?: No     Within the last year, have you been humiliated or emotionally abused in other ways by your partner or ex-partner?: No     Within the last year, have you been kicked, hit, slapped, or otherwise physically hurt by your partner or ex-partner?: No     Within the last year, have you been raped or forced to have any kind of sexual activity by your partner or ex-partner?: No   Social Connections: Not on file   Financial Resource Strain: Not on file   Health Literacy: Not on file   Internet Connectivity: Not on file       Would you be willing to receive help with any of the needs that you have identified today? Not applicable       SHIPPING     Specialty Medication(s) to be Shipped:   Transplant: cyclosporine  25mg     Other medication(s) to be shipped: No additional medications requested for fill at this time    Specialty Medications not needed at this time: N/A     Changes to insurance: No    Cost and Payment: Patient has a $0 copay, payment information is not required.    Delivery Scheduled: Yes, Expected medication delivery date: 09/03/2024.     Medication will be delivered via UPS to the confirmed prescription address in [facility].    The patient will receive a drug information handout for each medication shipped and additional FDA Medication Guides as required.  Verified that patient has previously received a Conservation Officer, Historic Buildings and  a Surveyor, Mining.    The patient or caregiver noted above participated in the development of this care plan and knows that they can request review of or adjustments to the care plan at any time.      All of the patient's questions and concerns have been addressed.    Ronal FORBES Reusing, PharmD   [facility] Specialty Pharmacist       [1]   Current Outpatient Medications   Medication Sig Dispense Refill    acetaminophen  (TYLENOL ) 325 MG tablet 2 tablets (650 mg total) by G-tube route every six (6) hours as needed.  0    albuterol  2.5 mg/0.5 mL nebulizer solution Inhale 0.5 mL (2.5 mg total) by nebulization every six (6) hours as needed for wheezing or shortness of breath. 30 each 12    allopurinoL  (ZYLOPRIM ) 100 MG tablet Take 1 tablet (100 mg total) by mouth daily as needed.      amLODIPine (NORVASC) 5 MG tablet Take 1 tablet (5 mg total) by mouth in the morning.      calcitonin, salmon, (MIACALCIN) 200 unit/actuation nasal spray 1 spray into alternating nostrils in the morning.      cetirizine  (ZYRTEC ) 5 MG tablet TAKE 1 (ONE) TABLET BY MOUTH DAILY, MAX DAILY DOSE: 1 TABLET      cycloSPORINE  modified (NEORAL ) 25 MG capsule Take 2 capsules (50 mg total) by mouth two (2) times a day. 360 capsule 3    diclofenac sodium (VOLTAREN) 1 % gel Apply 2 g topically in the morning.      fluticasone  propionate (FLONASE ) 50 mcg/actuation nasal spray 1 spray into each nostril.      food supplemt, lactose-reduced (ENSURE) Liqd Take 120 mL by mouth.      gentamicin  1 mg/mL, sodium citrate  4% Gent locks with dialysis 2.4 mL 4    heparin  sodium,porcine (HEPARIN , PORCINE,) 1,000 unit/mL 1000 unit/mL injection This heparin  is for dialysis use ONLY. RN if you are unable to aspirate from both lumes, call provider piror to using the catheter. (Patient not taking: Reported on 05/30/2024) 1 mL 0    heparin  sodium,porcine (HEPARIN , PORCINE,) 5,000 unit/mL injection Inject 1 mL (5,000 Units total) under the skin every eight (8) hours. (Patient not taking: Reported on 05/30/2024) 1 mL 0    levETIRAcetam  (KEPPRA ) 500 mg/100 mL PgBk 1 tablet.      lipase/protease/amylase (PANCRELIPASE  16,000 ORAL) Pancrelipase  24000-76000      loratadine  (CLARITIN ) 10 mg tablet Take 1 tablet (10 mg total) by mouth daily as needed.      metoprolol  succinate (TOPROL -XL) 50 MG 24 hr tablet 1 tablet (50 mg total) daily.      ondansetron  (ZOFRAN ) 4 MG tablet TAKE 1 TABLET BY MOUTH EVERY EIGHT HOURS AS NEEDED NAUSEA/VOMITING      oxyCODONE  (ROXICODONE ) 5 MG immediate release tablet 1 tablet (5 mg total) by G-tube route every six (6) hours as needed. for up to 5 days 10 tablet 0    vitamin B complx-C-FA-zinc  cit (DIALYVITE  800 WITH ZINC  15) 0.8-15 mg Tab Take by mouth.      vitamin E , dl, acetate, 22.5 mg (50 unit)/mL Drop Take 1 capsule by mouth.       No current facility-administered medications for this visit.   [2]   Allergies  Allergen Reactions    Iodinated Contrast Media     Ioxaglate Sodium     Latex     Succinylcholine Chloride  Other reaction(s): Other (See Comments)

## 2024-08-28 NOTE — Progress Notes (Signed)
 Updated med chart per Pt and SSC.

## 2024-08-29 NOTE — Telephone Encounter (Signed)
 Spoke with pt and confirmed appointment on 10/22/24. Sent letter.

## 2024-09-02 MED FILL — CYCLOSPORINE MODIFIED 25 MG CAPSULE: ORAL | 90 days supply | Qty: 360 | Fill #1

## 2024-09-09 NOTE — Progress Notes (Signed)
 Pharmacy Pre-transplant Evaluation/ Selection Committee Note    Evaluation for kidney transplant    Patient:  Katherine Norton, Age:  68 y.o. old, Gender:  female    Allergies:  Iodinated contrast media, Ioxaglate sodium, Latex, and Succinylcholine chloride    Immunization history:    Immunization History   Administered Date(s) Administered    COVID-19 VAC,BIVALENT,MODERNA(BLUE CAP) 07/03/2021    COVID-19 VACCINE,MRNA(MODERNA)(PF) 12/24/2019, 01/21/2020, 09/11/2020, 03/13/2021    HEPATITIS B VACCINE ADULT, ADJUVANTED, IM(HEPLISAV B) 12/11/2020, 01/06/2021, 02/12/2021, 04/09/2021    HEPATITIS B VACCINE ADULT,IM(ENERGIX B, RECOMBIVAX) 08/24/2018, 09/24/2018, 10/26/2018, 02/22/2019, 05/22/2019, 06/21/2019, 07/19/2019, 11/22/2019    INFLUENZA QUAD HIGH DOSE 26YRS+(FLUZONE) 08/21/2020    Influenza Vaccine Quad(IM)6 MO-Adult(PF) 07/06/2017, 07/26/2019    Influenza Virus Vaccine, unspecified formulation 07/01/2015, 08/22/2016, 07/06/2017, 07/26/2019, 08/07/2021    PNEUMOCOCCAL POLYSACCHARIDE 23-VALENT 08/22/2018, 09/03/2018    Pneumococcal Conjugate 13-Valent 07/06/2017    SHINGRIX -ZOSTER VACCINE (HZV),RECOMBINANT,ADJUVANTED(IM) 01/15/2018, 05/14/2020       Alcohol, tobacco, illicit drug use history:  Social History     Substance and Sexual Activity   Alcohol Use No    Alcohol/week: 0.0 standard drinks of alcohol     Tobacco Use History[1]  Social History     Substance and Sexual Activity   Drug Use No       Home medications:  Current Outpatient Medications   Medication Instructions    acetaminophen  (TYLENOL ) 650 mg, G-tube, Every 6 hours PRN    albuterol  2.5 mg, Nebulization, Every 6 hours PRN    allopurinol  (ZYLOPRIM ) 100 mg, Daily PRN    amlodipine (NORVASC) 5 mg, Daily    calcitonin, salmon, (MIACALCIN) 200 unit/actuation nasal spray 1 spray, Daily (RT)    cetirizine  (ZYRTEC ) 5 MG tablet TAKE 1 (ONE) TABLET BY MOUTH DAILY, MAX DAILY DOSE: 1 TABLET    cycloSPORINE  modified (NEORAL ) 50 mg, Oral, 2 times a day diclofenac sodium (VOLTAREN) 2 g, Daily    fluticasone  propionate (FLONASE ) 50 mcg/actuation nasal spray 1 spray    food supplemt, lactose-reduced (ENSURE) Liqd 120 mL    gentamicin  1 mg/mL, sodium citrate  4% Gent locks with dialysis    levETIRAcetam  (KEPPRA ) 500 mg/100 mL PgBk 1 tablet    lipase/protease/amylase (PANCRELIPASE  16,000 ORAL) Pancrelipase  24000-76000    loratadine  (CLARITIN ) 10 mg, Daily PRN    metoPROLOL  succinate (TOPROL -XL) 50 mg, Daily (standard)    ondansetron  (ZOFRAN ) 4 MG tablet TAKE 1 TABLET BY MOUTH EVERY EIGHT HOURS AS NEEDED NAUSEA/VOMITING    oxyCODONE  (ROXICODONE ) 5 mg, G-tube, Every 6 hours PRN    vitamin B complx-C-FA-zinc  cit (DIALYVITE  800 WITH ZINC  15) 0.8-15 mg Tab Take by mouth.    vitamin E , dl, acetate, 22.5 mg (50 unit)/mL Drop 1 capsule       Potential pharmacotherapy related concerns for transplantation:  1. anticoagulation concerns: N/A  2. cytochrome P-450 isoenzyme-mediated drug interactions:   none  3. other drug-interactions with medications post-transplant:  none  4. mental health-related medication(s):   amitrtiptyline  5. chronic pain-related medication use:  oxycodone  1-2 tablets three times per day (45 MME)  6. use of hormonal contraception and replacement therapy:  none  7. prior or current use of immunosuppressants:CSA for prior OLT    8. issues with drug absorption: none   9. use of herbal supplements:  Vitamin E     Non-adherence concerns post-transplantation based on pre-transplant evaluation assessment:  no     Recommendations:  1. No pharmacological concerns for transplantation    Spent 10 minutes completing medication review  and addressing any pharmacological concerns with members of the multidisciplinary transplant team.    Thank you,  Leita CHRISTELLA Brock, CPP, PharmD         [1]   Social History  Tobacco Use   Smoking Status Never    Passive exposure: Never   Smokeless Tobacco Never

## 2024-09-09 NOTE — Progress Notes (Signed)
 Kidney Transplant Nutrition Evaluation    Chart reviewed and there are no nutrition barriers for transplant at this time. BMI of 22.65 (05/30/24) and HgbA1c of 4.4% (03/14/23)  are acceptable for transplant.     Will continue to follow per protocol or when consulted.     Dagoberto Glance, MS, RDN, LDN  Clinical Dietitian II

## 2024-09-16 DIAGNOSIS — Z796 Long term current use of immunosuppressive drug: Principal | ICD-10-CM

## 2024-09-16 DIAGNOSIS — Z944 Liver transplant status: Principal | ICD-10-CM

## 2024-09-17 NOTE — Telephone Encounter (Signed)
 Placed call to Pt for lab reminder, no answer, left vm.

## 2024-10-02 NOTE — Patient Instructions (Signed)
 Below is our plan:  We will continue levetiracetam  500mg  daily with extra 500mg  dose on dailysis days (M, W,F)  Please make sure you are consistent with timing of seizure medication. I recommend annual visit with primary care provider (PCP) for complete physical and routine blood work. I recommend daily intake of vitamin D  (400-800iu) and calcium  (800-1000mg ) for bone health. Discuss Dexa screening with PCP.   According to [place] law, you can not drive unless you are seizure / syncope free for at least 6 months and under physician's care.  Please maintain precautions. Do not participate in activities where a loss of awareness could harm you or someone else. No swimming alone, no tub bathing, no hot tubs, no driving, no operating motorized vehicles (cars, ATVs, motocycles, etc), lawnmowers, power tools or firearms. No standing at heights, such as rooftops, ladders or stairs. Avoid hot objects such as stoves, heaters, open fires. Wear a helmet when riding a bicycle, scooter, skateboard, etc. and avoid areas of traffic. Set your water  heater to 120 degrees or less.  SUDEP is the sudden, unexpected death of someone with epilepsy, who was otherwise healthy. In SUDEP cases, no other cause of death is found when an autopsy is done. Each year, more than 1 in 1,000 people with epilepsy die from SUDEP. This is the leading cause of death in people with uncontrolled seizures. Until further answers are available, the best way to prevent SUDEP is to lower your risk by controlling seizures. Research has found that people with all types of epilepsy that experience convulsive seizures can be at risk.  Please make sure you are staying well hydrated. I recommend 50-60 ounces daily. Well balanced diet and regular exercise encouraged. Consistent sleep schedule with 6-8 hours recommended.   Please continue follow up with care team as directed.   Follow up with me in 1 year   You may receive a survey regarding today's visit.  I encourage you to leave honest feed back as I do use this information to improve patient care. Thank you for seeing me today!

## 2024-10-02 NOTE — Progress Notes (Signed)
 Chief Complaint  Patient presents with   RM1/SEIZURES    Pt is here Alone. Pt states she hasn't had any recent seizure activity   HISTORY OF PRESENT ILLNESS:  10/03/2024 ALL: Tami [name] returns for follow up for seizures. She continues levetiracetam  500mg  daily with extra 500mg  dose on dailysis days (M, W,F). No seizure activity. She reports feeling fairly well. She continues dialysis. Followed closely by PCP and nephrology. She drives short, local distances. She does report having a hard time sleeping. She is grieving the loss of her son. He passed away about 5 years ago. She has a daughter and two brothers but feels they get upset when she talks about her son.   09/28/2023 ALL:  Tami [name] returns for follow up for seizures. She continues levetiracetam  500mg  daily with extra 500mg  dose on dailysis days (M, W,F). She denies seizures. She is tolerating med. She drives on occasion. Only local, short distances. She is feeling well and without complaints, today.   09/27/2022 ALL:  Tami [name] returns for follow up for seizures. She continues levetiracetam  500mg  daily with extra 500mg  dose on dailysis days (M, W,F). She continues to tolerate med. No seizures. She reports muscle jerks have decreased. She reports rare episodes and she feels that it happens when she is nodding off to sleep. She did not feel EEG was needed. She is feeling well and without concerns.   09/23/2021 ALL: Tami [name] returns for follow up for seizures. She continues levetiracetam  500mg  daily with extra 500mg  dose M,W,F. Previous EEG and MRI were unremarkable with last event 09/2018. She denies seizure activity since. She feels that she is doing fairly well. She does continue to note intermittent muscle jerks at night. May happen 1-2 times a month or may not occur for several months. She is alert and aware of symptoms. Always her upper extremities and usually both arms jump. Last a couple of seconds. No post ictal symptoms. She is tolerating meds.  She continues dialysis. She is followed closely by PCP and nephrology. She lives alone. She drives without difficulty. She uses cane for stability and denies falls.   09/22/2020 ALL:  Tami [name] is a 68 y.o. female here today for follow up for seizures. She continues levetiracetam  500mg  daily with extra dose of 500mg  on dialysis days (M,W,F). She is tolerating medication well. No seizure activity. She does have some shaking of her whole body at night when in bed. She is aware of shaking and does not lose consciousness. She is able to get up and move around and shaking improves. She lost her son in a car accident last year and now lives alone. She has family locally that helps take care of her and her daughter visits often from [place] . She drives without difficulty.   HISTORY (copied from previous note)  UPDATE (06/18/19, VRP): Since last visit, doing well. Symptoms are stable. No seizures. No alleviating or aggravating factors. Tolerating levetiracetam .     PRIOR HPI (12/17/18): 68 year old female here for evaluation of seizures.  History of liver transplant, hypertension, end-stage renal disease on hemodialysis.   10/03/2018 patient was at office visit, about to give blood when all of a sudden she became unresponsive and had some jerking movements.  Staff could not feel a pulse and started CPR.  Patient was immediately attended to by the rapid response team.  She was taken downstairs to the emergency room.  She may have had some type of gaze deviation initially resolved spontaneously.  While in the  emergency room she had a second event with tonic-clonic seizures.  She was given IV Ativan  and treated with Keppra .  She was admitted for further evaluation.   MRI, MRA of the head were obtained which showed no acute findings.  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  EEG was unremarkable.  Patient was discharged on no antiseizure medication.   Since that time  patient is doing well.  No further events.   REVIEW OF SYSTEMS: Out of a complete 14 system review of symptoms, the patient complains only of the following symptoms, shaking at night and all other reviewed systems are negative.   ALLERGIES: Allergies  Allergen Reactions   Iodinated Contrast Media Other (See Comments)    UNSPECIFIED REACTION rash    Ioxaglate     UNSPECIFIED REACTION    Latex     UNSPECIFIED REACTION    Quelicin  [Succinylcholine Chloride] Other (See Comments)    Unknown reaction     HOME MEDICATIONS: Outpatient Medications Prior to Visit  Medication Sig Dispense Refill   acetaminophen  (TYLENOL ) 325 MG tablet Take 325 mg by mouth every 6 (six) hours as needed for mild pain or headache.     albuterol  (VENTOLIN  HFA) 108 (90 Base) MCG/ACT inhaler Inhale 4 puffs into the lungs every 6 (six) hours as needed for wheezing or shortness of breath.     allopurinol  (ZYLOPRIM ) 100 MG tablet Take 200 mg by mouth daily.  5   amitriptyline (ELAVIL) 25 MG tablet Take 25 mg by mouth at bedtime.     B Complex-C-Zn-Folic Acid  (DIALYVITE 800-ZINC 15) 0.8 MG TABS Take 1 tablet by mouth daily.     cycloSPORINE  modified (NEORAL ) 25 MG capsule Take 50 mg by mouth 2 (two) times daily.      ENSURE (ENSURE) Take 237 mLs by mouth daily.     fluticasone (FLONASE) 50 MCG/ACT nasal spray Place 1 spray into both nostrils daily as needed for allergies or rhinitis.     gabapentin  (NEURONTIN ) 100 MG capsule Take 200 mg by mouth 2 (two) times daily as needed (pain).     lidocaine -prilocaine (EMLA) cream APPLY SMALL AMOUNT TO ACCESS SITE (AVF) 30 - 60 MINUTES BEFORE DIALYSIS. COVER WITH OCCLUSIVE DRESSING ( SARAN WRAP)     loratadine (CLARITIN) 10 MG tablet Take 10 mg by mouth daily as needed for allergies. X 10 days     losartan (COZAAR) 50 MG tablet Take 50 mg by mouth at bedtime.     metoprolol  succinate (TOPROL -XL) 25 MG 24 hr tablet Take 12.5 mg by mouth daily.     ondansetron  (ZOFRAN  ODT) 4 MG  disintegrating tablet Dissolve 1 tablet (4 mg total) by mouth every 8 (eight) hours as needed for nausea or vomiting. 20 tablet 0   oxyCODONE  (OXY IR/ROXICODONE ) 5 MG immediate release tablet Take 5 mg by mouth every 6 (six) hours as needed for pain.     Pancrelipase , Lip-Prot-Amyl, 24000-76000 units CPEP Take 1 capsule by mouth See admin instructions. Take 1 capsule with every meal and every snack.     pantoprazole  (PROTONIX ) 40 MG tablet Take 40 mg by mouth in the morning and at bedtime.     progesterone (PROMETRIUM) 100 MG capsule Take 100 mg by mouth daily.     sildenafil (REVATIO) 20 MG tablet Take 20 mg by mouth daily.     sucroferric oxyhydroxide (VELPHORO) 500 MG chewable tablet Chew 500-1,000 mg by mouth See admin instructions. Take 2  tablets three times a day with each meal and 1 tablets with each snack.     Vitamin D , Ergocalciferol , (DRISDOL) 1.25 MG (50000 UT) CAPS capsule Take 50,000 Units by mouth once a week. Sunday     PARoxetine (PAXIL) 10 MG tablet Take 10 mg by mouth daily. (Patient not taking: Reported on 10/03/2024)     levETIRAcetam (KEPPRA) 500 MG tablet Take 1 tablet (500 mg total) by mouth daily AND 1 tablet (500 mg total) 3 (three) times a week. Monday, Wednesday and Friday following dialysis. 130 tablet 3   No facility-administered medications prior to visit.     PAST MEDICAL HISTORY: Past Medical History:  Diagnosis Date   Anemia    Arthritis    ESRD (end stage renal disease) on dialysis (HCC)    MWF; [place] (10/04/2018)   Gout    Hepatitis, autoimmune (HCC) 12/08/2011   Hypertension    Seizures (HCC) 10/03/2018     PAST SURGICAL HISTORY: Past Surgical History:  Procedure Laterality Date   A/V FISTULAGRAM Left 07/25/2024   Procedure: A/V Fistulagram;  Surgeon: Brabham, Vance W, MD;  Location: [facility];  Service: Cardiovascular;  Laterality: Left;   A/V FISTULAGRAM Left 08/14/2024   Procedure: A/V Fistulagram;  Surgeon: Hawken, Thomas N, MD;   Location: [facility];  Service: Cardiovascular;  Laterality: Left;   ABDOMINAL HYSTERECTOMY     AV FISTULA PLACEMENT Left 10/02/2018   Procedure: Creation of left arm Brachiocephalic Fistula;  Surgeon: Cain, Brandon Christopher, MD;  Location: [facility];  Service: Vascular;  Laterality: Left;   FISTULA SUPERFICIALIZATION Left 11/29/2018   Procedure: TRANSLOCATION/FISTULA SUPERFICIALIZATION OF LEFT ARM FISTULA;  Surgeon: Clark, Christopher J, MD;  Location: [facility];  Service: Vascular;  Laterality: Left;   LIVER TRANSPLANT     19 96   REVISON OF ARTERIOVENOUS FISTULA Left 05/10/2022   Procedure: EXISION OF ULCER OVERLYING  ARTERIOVENOUS FISTULA;  Surgeon: Eliza Lonni RAMAN, MD;  Location: [facility];  Service: Vascular;  Laterality: Left;   TONSILLECTOMY     VENOUS ANGIOPLASTY  07/25/2024   Procedure: VENOUS ANGIOPLASTY;  Surgeon: Serene Gaile ORN, MD;  Location: [facility];  Service: Cardiovascular;;  cephalic arch     FAMILY HISTORY: Family History  Problem Relation Age of Onset   Stroke Mother    Cancer Father    Lupus Sister        1 sister   Hypertension Sister    Cancer Brother        prostate- 1 brother   Lupus Brother      SOCIAL HISTORY: Social History   Socioeconomic History   Marital status: Divorced    Spouse name: Not on file   Number of children: 2   Years of education: some college   Highest education level: Not on file  Occupational History    Comment: na  Tobacco Use   Smoking status: Never   Smokeless tobacco: Never   Tobacco comments:    never used tobacco  Vaping Use   Vaping status: Never Used  Substance and Sexual Activity   Alcohol use: Not Currently    Alcohol/week: 0.0 standard drinks of alcohol   Drug use: No   Sexual activity: Not on file  Other Topics Concern   Not on file  Social History Narrative   Lives with son   Caffeine - tea , little    Social Drivers of Health   Tobacco Use: Low Risk (10/03/2024)   Patient History  Smoking  Tobacco Use: Never    Smokeless Tobacco Use: Never    Passive Exposure: Not on file  Financial Resource Strain: Not on file  Food Insecurity: No Food Insecurity (05/21/2024)   Epic    Worried About Programme Researcher, Broadcasting/film/video in the Last Year: Never true    Ran Out of Food in the Last Year: Never true  Transportation Needs: No Transportation Needs (05/21/2024)   Epic    Lack of Transportation (Medical): No    Lack of Transportation (Non-Medical): No  Physical Activity: Not on file  Stress: Not on file  Social Connections: Not on file  Intimate Partner Violence: Not At Risk (05/21/2024)   Epic    Fear of Current or Ex-Partner: No    Emotionally Abused: No    Physically Abused: No    Sexually Abused: No  Depression (PHQ2-9): Low Risk (05/21/2024)   Depression (PHQ2-9)    PHQ-2 Score: 0  Alcohol Screen: Low Risk (05/21/2024)   Alcohol Screen    Last Alcohol Screening Score (AUDIT): 0  Housing: Low Risk (05/21/2024)   Epic    Unable to Pay for Housing in the Last Year: No    Number of Times Moved in the Last Year: 0    Homeless in the Last Year: No  Utilities: Not At Risk (05/21/2024)   Epic    Threatened with loss of utilities: No  Health Literacy: Not on file      PHYSICAL EXAM  Vitals:   10/03/24 1006  BP: (!) 152/99  Weight: 115 lb 8 oz (52.4 kg)  Height: 5' (1.524 m)       Body mass index is 22.56 kg/m.   Generalized: Well developed, in no acute distress  Cardiology: normal rate and rhythm, no murmur auscultated  Respiratory: clear to auscultation bilaterally    Neurological examination  Mentation: Alert oriented to time, place, history taking. Follows all commands speech and language fluent Cranial nerve II-XII: Pupils were equal round reactive to light. Extraocular movements were full, visual field were full on confrontational test. Facial sensation and strength were normal. Head turning and shoulder shrug  were normal and symmetric. Motor: The motor testing  reveals 5 over 5 strength of bilateral upper extremities, 4/5 left hip flexion, 3+/4 right hip flexion.  Sensory: Sensory testing is intact to soft touch on all 4 extremities. No evidence of extinction is noted.  Coordination: Cerebellar testing reveals good finger-nose-finger and heel-to-shin bilaterally.  Gait and station: Gait is stable with cane. Tandem not attempted  Reflexes: Deep tendon reflexes are reduced but symmetric bilaterally.     DIAGNOSTIC DATA (LABS, IMAGING, TESTING) - I reviewed patient records, labs, notes, testing and imaging myself where available.  Lab Results  Component Value Date   WBC 2.7 (L) 05/21/2024   HGB 10.5 (L) 05/21/2024   HCT 31.4 (L) 05/21/2024   MCV 95.7 05/21/2024   PLT 130 (L) 05/21/2024      Component Value Date/Time   NA 137 05/21/2024 0842   NA 137 08/10/2018 0000   NA 140 10/13/2017 1106   NA 138 04/11/2017 1023   K 4.4 05/21/2024 0842   K 5.3 no visable hemolysis (H) 10/13/2017 1106   K 5.2 No visable hemolysis (H) 04/11/2017 1023   CL 94 (L) 05/21/2024 0842   CL 104 10/13/2017 1106   CO2 26 05/21/2024 0842   CO2 17 (L) 10/13/2017 1106   CO2 19 (L) 04/11/2017 1023   GLUCOSE 85 05/21/2024 0842  GLUCOSE 116 10/13/2017 1106   BUN 30 (H) 05/21/2024 0842   BUN 1 (A) 08/10/2018 0000   BUN 58 (H) 10/13/2017 1106   BUN 33.4 (H) 04/11/2017 1023   CREATININE 6.29 (H) 05/21/2024 0842   CREATININE 5.9 (HH) 10/13/2017 1106   CREATININE 1.8 (H) 04/11/2017 1023   CALCIUM  9.1 05/21/2024 0842   CALCIUM  8.4 10/13/2017 1106   CALCIUM  9.0 04/11/2017 1023   PROT 8.5 (H) 05/21/2024 0842   PROT 7.7 10/13/2017 1106   PROT 8.8 (H) 04/11/2017 1023   ALBUMIN  3.7 05/21/2024 0842   ALBUMIN  3.3 10/13/2017 1106   ALBUMIN  3.7 04/11/2017 1023   AST 29 05/21/2024 0842   AST 16 04/11/2017 1023   ALT 14 05/21/2024 0842   ALT 15 10/13/2017 1106   ALT 8 04/11/2017 1023   ALKPHOS 95 05/21/2024 0842   ALKPHOS 68 10/13/2017 1106   ALKPHOS 80 04/11/2017  1023   BILITOT 0.8 05/21/2024 0842   BILITOT 0.54 04/11/2017 1023   GFRNONAA 7 (L) 05/21/2024 0842   GFRAA 13 (L) 02/20/2020 1411   Lab Results  Component Value Date   CHOL 124 10/03/2018   HDL 23 (L) 10/03/2018   LDLCALC 73 10/03/2018   TRIG 138 10/03/2018   CHOLHDL 5.4 10/03/2018   No results found for: HGBA1C Lab Results  Component Value Date   VITAMINB12 301 10/03/2018   Lab Results  Component Value Date   TSH 0.831 10/03/2018    ASSESSMENT AND PLAN  68 y.o. year old female  has a past medical history of Anemia, Arthritis, ESRD (end stage renal disease) on dialysis (HCC), Gout, Hepatitis, autoimmune (HCC) (12/08/2011), Hypertension, and Seizures (HCC) (10/03/2018). here with   Seizures (HCC) - Plan: levETIRAcetam  (KEPPRA ) 500 MG tablet  Grief at loss of child - Plan: Ambulatory referral to Psychology  Tami [name] is doing well, today.  She continues levetiracetam  is tolerating well.  We will continue 500 mg daily with an extra dose of 500 mg on Monday Wednesday Friday.  [facility]ills [facility] per her request. I have placed a referral to psychology in her hometown. She will continue close follow-up with care team.  Healthy lifestyle habits encouraged.  She will follow-up with me in 1 year, sooner if needed.  She verbalizes understanding and agreement with this plan.  Meds ordered this encounter  Medications   levETIRAcetam  (KEPPRA ) 500 MG tablet    Sig: Take 1 tablet (500 mg total) by mouth daily AND 1 tablet (500 mg total) 3 (three) times a week. Monday, Wednesday and Friday following dialysis.    Dispense:  130 tablet    Refill:  3    Supervising Provider:   YAN, YIJUN [[id number]]     Greig Forbes, MSN, FNP-C 10/03/2024, 10:33 AM  [facility] [address] [place], [place] [address] [phone]

## 2024-10-03 ENCOUNTER — Encounter: Payer: Self-pay | Admitting: Family Medicine

## 2024-10-03 ENCOUNTER — Ambulatory Visit: Payer: 59 | Admitting: Family Medicine

## 2024-10-03 VITALS — BP 152/99 | Ht 60.0 in | Wt 115.5 lb

## 2024-10-03 DIAGNOSIS — R1311 Dysphagia, oral phase: Secondary | ICD-10-CM | POA: Insufficient documentation

## 2024-10-03 DIAGNOSIS — Z634 Disappearance and death of family member: Secondary | ICD-10-CM | POA: Diagnosis not present

## 2024-10-03 DIAGNOSIS — R569 Unspecified convulsions: Secondary | ICD-10-CM

## 2024-10-03 DIAGNOSIS — Z789 Other specified health status: Secondary | ICD-10-CM | POA: Insufficient documentation

## 2024-10-03 DIAGNOSIS — F4321 Adjustment disorder with depressed mood: Secondary | ICD-10-CM

## 2024-10-03 DIAGNOSIS — R491 Aphonia: Secondary | ICD-10-CM | POA: Insufficient documentation

## 2024-10-03 MED ORDER — LEVETIRACETAM 500 MG PO TABS: ORAL_TABLET | ORAL | 3 refills | Status: AC

## 2024-10-07 ENCOUNTER — Telehealth: Payer: Self-pay | Admitting: Family Medicine

## 2024-10-07 NOTE — Telephone Encounter (Signed)
 FYI ,Pt have referral to Psychology  to see Kandance Perryman [email or web address] However the number provided  [phone] Has a voice mail that is  fill , On Voice message did stated that referral  can be sent thru email. I have sent an email request  I  have not heard back  .I sent email 10-03-24 , I just wanted to make sure I made note

## 2024-10-09 DIAGNOSIS — Z01818 Encounter for other preprocedural examination: Principal | ICD-10-CM

## 2024-10-09 DIAGNOSIS — N186 End stage renal disease: Principal | ICD-10-CM

## 2024-10-09 NOTE — Telephone Encounter (Signed)
 Spoke to patient and confirmed Ct Scan & Surg appt over the phone. Pt expressed wanting CT scan on separate day in [place].

## 2024-10-09 NOTE — Telephone Encounter (Signed)
 Placed return call to Pt to repeat labs, she will repeat at 11/05/24 CT appt in [place], made lab appt and labs ordered already.

## 2024-10-14 DIAGNOSIS — Z944 Liver transplant status: Principal | ICD-10-CM

## 2024-10-14 DIAGNOSIS — Z796 Long term current use of immunosuppressive drug: Principal | ICD-10-CM

## 2024-11-05 ENCOUNTER — Ambulatory Visit: Admit: 2024-11-05 | Discharge: 2024-11-06 | Payer: Medicare (Managed Care)

## 2024-11-05 ENCOUNTER — Inpatient Hospital Stay: Admit: 2024-11-05 | Discharge: 2024-11-05 | Payer: Medicare (Managed Care)

## 2024-11-05 LAB — COMPREHENSIVE METABOLIC PANEL
ALBUMIN: 3 g/dL — ABNORMAL LOW (ref 3.4–5.0)
ALKALINE PHOSPHATASE: 99 U/L (ref 46–116)
ALT (SGPT): 10 U/L (ref 10–49)
ANION GAP: 18 mmol/L — ABNORMAL HIGH (ref 5–14)
AST (SGOT): 23 U/L (ref <=34)
BILIRUBIN TOTAL: 0.3 mg/dL (ref 0.3–1.2)
BLOOD UREA NITROGEN: 18 mg/dL (ref 9–23)
BUN / CREAT RATIO: 3
CALCIUM: 9.6 mg/dL (ref 8.7–10.4)
CHLORIDE: 90 mmol/L — ABNORMAL LOW (ref 98–107)
CO2: 29.7 mmol/L (ref 20.0–31.0)
CREATININE: 5.26 mg/dL — ABNORMAL HIGH (ref 0.55–1.02)
EGFR CKD-EPI (2021) FEMALE: 8 mL/min/1.73m2 — ABNORMAL LOW (ref >=60)
GLUCOSE RANDOM: 78 mg/dL (ref 70–179)
POTASSIUM: 4 mmol/L (ref 3.4–4.8)
PROTEIN TOTAL: 8.7 g/dL — ABNORMAL HIGH (ref 5.7–8.2)
SODIUM: 138 mmol/L (ref 135–145)

## 2024-11-05 LAB — CBC W/ AUTO DIFF
BASOPHILS ABSOLUTE COUNT: 0 10*9/L (ref 0.0–0.1)
BASOPHILS RELATIVE PERCENT: 1 %
EOSINOPHILS ABSOLUTE COUNT: 0.1 10*9/L (ref 0.0–0.5)
EOSINOPHILS RELATIVE PERCENT: 2.4 %
HEMATOCRIT: 30.1 % — ABNORMAL LOW (ref 34.0–44.0)
HEMOGLOBIN: 10.4 g/dL — ABNORMAL LOW (ref 11.3–14.9)
LYMPHOCYTES ABSOLUTE COUNT: 1 10*9/L — ABNORMAL LOW (ref 1.1–3.6)
LYMPHOCYTES RELATIVE PERCENT: 30.1 %
MEAN CORPUSCULAR HEMOGLOBIN CONC: 34.5 g/dL (ref 32.0–36.0)
MEAN CORPUSCULAR HEMOGLOBIN: 31.5 pg (ref 25.9–32.4)
MEAN CORPUSCULAR VOLUME: 91.4 fL (ref 77.6–95.7)
MEAN PLATELET VOLUME: 7.6 fL (ref 6.8–10.7)
MONOCYTES ABSOLUTE COUNT: 0.5 10*9/L (ref 0.3–0.8)
MONOCYTES RELATIVE PERCENT: 14.9 %
NEUTROPHILS ABSOLUTE COUNT: 1.7 10*9/L — ABNORMAL LOW (ref 1.8–7.8)
NEUTROPHILS RELATIVE PERCENT: 51.6 %
NUCLEATED RED BLOOD CELLS: 0 /100{WBCs} (ref <=4)
PLATELET COUNT: 146 10*9/L — ABNORMAL LOW (ref 150–450)
RED BLOOD CELL COUNT: 3.29 10*12/L — ABNORMAL LOW (ref 3.95–5.13)
RED CELL DISTRIBUTION WIDTH: 14.1 % (ref 12.2–15.2)
WBC ADJUSTED: 3.3 10*9/L — ABNORMAL LOW (ref 3.6–11.2)

## 2024-11-05 LAB — BILIRUBIN, DIRECT: BILIRUBIN DIRECT: 0.1 mg/dL (ref 0.00–0.30)

## 2024-11-05 LAB — MAGNESIUM: MAGNESIUM: 2 mg/dL (ref 1.6–2.6)

## 2024-11-05 LAB — GAMMA GT: GAMMA GLUTAMYL TRANSFERASE: 17 U/L (ref 0–38)

## 2024-11-05 LAB — PHOSPHORUS: PHOSPHORUS: 6 mg/dL — ABNORMAL HIGH (ref 2.4–5.1)

## 2024-11-05 LAB — CYCLOSPORINE LEVEL: CYCLOSPORINE (FPIA) BLOOD: 34 ng/mL

## 2024-11-06 NOTE — Telephone Encounter (Signed)
 Lab results from 11/05/24 were reviewed with Katherine Norton ANP. Liver labs stable, CsA low at 34 (goal 40-80). No changes recommended at this time. Pt to repeat labs in April.

## 2024-11-11 DIAGNOSIS — Z796 Long term current use of immunosuppressive drug: Principal | ICD-10-CM

## 2024-11-11 DIAGNOSIS — Z944 Liver transplant status: Principal | ICD-10-CM

## 2024-11-15 NOTE — Progress Notes (Signed)
 [facility] Refill Coordination Note    Specialty Medication(s) to be Shipped:   Transplant: cyclosporine  25mg     Other medication(s) to be shipped: No additional medications requested for fill at this time    Specialty Medications not needed at this time: N/A     Katherine Norton, DOB: [birth year]  Phone: [phone] (home)       All above HIPAA information was verified with patient.     Was a nurse, learning disability used for this call? No    Completed refill call assessment today to schedule patient's medication shipment from the [facility]  [phone]).  All relevant notes have been reviewed.     Specialty medication(s) and dose(s) confirmed: Regimen is correct and unchanged.   Changes to medications: [name] reports no changes at this time.  Changes to insurance: No  New side effects reported not previously addressed with a pharmacist or physician: None reported  Questions for the pharmacist: No    Confirmed patient received a Conservation Officer, Historic Buildings and a Surveyor, Mining with first shipment. The patient will receive a drug information handout for each medication shipped and additional FDA Medication Guides as required.       DISEASE/MEDICATION-SPECIFIC INFORMATION        N/A    SPECIALTY MEDICATION ADHERENCE     Medication Adherence    Patient reported X missed doses in the last month: 0  Specialty Medication: cycloSPORINE  modified 25 MG capsule (Neoral )  Patient is on additional specialty medications: No              Were doses missed due to medication being on hold? No      cycloSPORINE  modified 25 MG capsule (Neoral ): 7 days of medicine on hand       Specialty medication is an injection or given on a cycle: No    REFERRAL TO PHARMACIST     Referral to the pharmacist: Not needed      [facility]     Shipping address confirmed in Epic.     Cost and Payment: Patient has a copay of $1.60. They are aware and have authorized the pharmacy to charge the credit card on file.    Delivery Scheduled: Yes, Expected medication delivery date: 11/21/24.     Medication will be delivered via UPS to the prescription address in Epic WAM.    [facility]  Specialty Technician

## 2024-11-20 MED FILL — CYCLOSPORINE MODIFIED 25 MG CAPSULE: ORAL | 90 days supply | Qty: 360 | Fill #2
